# Patient Record
Sex: Female | Born: 1935 | Race: Black or African American | Hispanic: No | Smoking: Former smoker
Health system: Southern US, Community
[De-identification: ages and names within clinical notes are randomized; demographics above are authoritative.]

## PROBLEM LIST (undated history)

## (undated) DIAGNOSIS — K651 Peritoneal abscess: Secondary | ICD-10-CM

## (undated) DIAGNOSIS — N3289 Other specified disorders of bladder: Secondary | ICD-10-CM

## (undated) DIAGNOSIS — J9610 Chronic respiratory failure, unspecified whether with hypoxia or hypercapnia: Secondary | ICD-10-CM

## (undated) DIAGNOSIS — I480 Paroxysmal atrial fibrillation: Secondary | ICD-10-CM

## (undated) DIAGNOSIS — D649 Anemia, unspecified: Secondary | ICD-10-CM

## (undated) DIAGNOSIS — M1990 Unspecified osteoarthritis, unspecified site: Secondary | ICD-10-CM

## (undated) DIAGNOSIS — J449 Chronic obstructive pulmonary disease, unspecified: Secondary | ICD-10-CM

## (undated) DIAGNOSIS — J189 Pneumonia, unspecified organism: Secondary | ICD-10-CM

## (undated) DIAGNOSIS — IMO0002 Reserved for concepts with insufficient information to code with codable children: Secondary | ICD-10-CM

## (undated) DIAGNOSIS — N183 Chronic kidney disease, stage 3 unspecified: Secondary | ICD-10-CM

## (undated) DIAGNOSIS — IMO0001 Reserved for inherently not codable concepts without codable children: Secondary | ICD-10-CM

## (undated) DIAGNOSIS — I10 Essential (primary) hypertension: Secondary | ICD-10-CM

## (undated) DIAGNOSIS — I5042 Chronic combined systolic (congestive) and diastolic (congestive) heart failure: Secondary | ICD-10-CM

## (undated) DIAGNOSIS — I4729 Other ventricular tachycardia: Secondary | ICD-10-CM

## (undated) DIAGNOSIS — Z87442 Personal history of urinary calculi: Secondary | ICD-10-CM

## (undated) DIAGNOSIS — K6389 Other specified diseases of intestine: Secondary | ICD-10-CM

## (undated) DIAGNOSIS — E049 Nontoxic goiter, unspecified: Secondary | ICD-10-CM

## (undated) DIAGNOSIS — K219 Gastro-esophageal reflux disease without esophagitis: Secondary | ICD-10-CM

## (undated) DIAGNOSIS — I639 Cerebral infarction, unspecified: Secondary | ICD-10-CM

## (undated) DIAGNOSIS — K5720 Diverticulitis of large intestine with perforation and abscess without bleeding: Secondary | ICD-10-CM

## (undated) DIAGNOSIS — E039 Hypothyroidism, unspecified: Secondary | ICD-10-CM

## (undated) DIAGNOSIS — I472 Ventricular tachycardia: Secondary | ICD-10-CM

## (undated) DIAGNOSIS — I493 Ventricular premature depolarization: Secondary | ICD-10-CM

## (undated) HISTORY — PX: EYE SURGERY: SHX253

## (undated) HISTORY — DX: Ventricular tachycardia: I47.2

## (undated) HISTORY — DX: Cerebral infarction, unspecified: I63.9

## (undated) HISTORY — DX: Nontoxic goiter, unspecified: E04.9

## (undated) HISTORY — DX: Unspecified osteoarthritis, unspecified site: M19.90

## (undated) HISTORY — DX: Chronic respiratory failure, unspecified whether with hypoxia or hypercapnia: J96.10

## (undated) HISTORY — DX: Essential (primary) hypertension: I10

## (undated) HISTORY — PX: COLON SURGERY: SHX602

## (undated) HISTORY — PX: CATARACT EXTRACTION W/ INTRAOCULAR LENS  IMPLANT, BILATERAL: SHX1307

## (undated) HISTORY — DX: Reserved for inherently not codable concepts without codable children: IMO0001

## (undated) HISTORY — DX: Reserved for concepts with insufficient information to code with codable children: IMO0002

## (undated) HISTORY — DX: Chronic combined systolic (congestive) and diastolic (congestive) heart failure: I50.42

## (undated) HISTORY — DX: Other ventricular tachycardia: I47.29

## (undated) HISTORY — DX: Ventricular premature depolarization: I49.3

---

## 1978-09-02 HISTORY — PX: APPENDECTOMY: SHX54

## 1978-09-02 HISTORY — PX: ABDOMINAL HYSTERECTOMY: SHX81

## 2000-06-09 ENCOUNTER — Encounter: Payer: Self-pay | Admitting: Emergency Medicine

## 2000-06-09 ENCOUNTER — Emergency Department (HOSPITAL_COMMUNITY): Admission: AC | Admit: 2000-06-09 | Discharge: 2000-06-09 | Payer: Self-pay | Admitting: Emergency Medicine

## 2005-08-30 ENCOUNTER — Encounter: Payer: Self-pay | Admitting: Cardiovascular Disease

## 2005-10-08 ENCOUNTER — Ambulatory Visit (HOSPITAL_COMMUNITY): Admission: RE | Admit: 2005-10-08 | Discharge: 2005-10-08 | Payer: Self-pay | Admitting: Cardiovascular Disease

## 2005-10-10 ENCOUNTER — Ambulatory Visit: Payer: Self-pay | Admitting: Internal Medicine

## 2005-11-21 ENCOUNTER — Ambulatory Visit: Payer: Self-pay | Admitting: Internal Medicine

## 2006-01-04 ENCOUNTER — Ambulatory Visit: Payer: Self-pay | Admitting: Internal Medicine

## 2007-01-02 ENCOUNTER — Ambulatory Visit: Payer: Self-pay | Admitting: Internal Medicine

## 2007-01-02 ENCOUNTER — Inpatient Hospital Stay (HOSPITAL_COMMUNITY): Admission: EM | Admit: 2007-01-02 | Discharge: 2007-01-07 | Payer: Self-pay | Admitting: Emergency Medicine

## 2007-03-12 ENCOUNTER — Inpatient Hospital Stay (HOSPITAL_BASED_OUTPATIENT_CLINIC_OR_DEPARTMENT_OTHER): Admission: RE | Admit: 2007-03-12 | Discharge: 2007-03-12 | Payer: Self-pay | Admitting: Cardiovascular Disease

## 2009-02-25 ENCOUNTER — Encounter: Payer: Self-pay | Admitting: Cardiovascular Disease

## 2010-05-16 NOTE — H&P (Signed)
[name], [name] NO.:  [id number]   MEDICAL RECORD NO.:  [id number]          PATIENT TYPE:  INP   LOCATION:  2009                         FACILITY:  [facility]   PHYSICIAN:  Vesta Mixer, M.D. DATE OF BIRTH:  [birth year]   DATE OF ADMISSION:  01/02/2007  DATE OF DISCHARGE:  01/07/2007                              HISTORY & PHYSICAL   [name] is a elderly female with a history of COPD and asthma.  She  also has a history of nonsustained ventricular tachycardia,  hyperlipidemia, and some chest pain.  She was recently admitted to the  hospital with some chest pain.  She had some very minimal troponin  elevations.  It was thought that her symptoms were primarily due to COPD  exacerbation/asthma exacerbation.  She recently had a stress Cardiolite  study which did not reveal any evidence of ischemia but did reveal  moderately depressed left ventricular systolic function.  Because of  these symptoms, we have decided to go ahead and admit her for heart  catheterization.   The patient continues to have some shortness of breath.  She does not  have any real chest pains.  She has been slowly getting better and  thinks that she is feeling better now but she still has these  limitations.   CURRENT MEDICATIONS:  1. Furosemide 40 mg a day.  2. Combivent 2 puffs a day.  3. Aspirin 81 mg a day.   SHE IS ALLERGIC TO INDOCIN.   PAST MEDICAL HISTORY:  1. Asthma/COPD.  2. Hyperlipidemia.  3. Congestive heart failure.   SOCIAL HISTORY:  The patient used to smoke but quit 26 years ago.  She  does not drink alcohol.   FAMILY HISTORY:  Unremarkable.   REVIEW OF SYSTEMS:  Is reviewed and is essentially negative except for  as noted.   Elderly female in no acute distress.  She is alert and oriented x3 and  her mood and affect are normal.  Her weight is 177, blood pressure is  118/64 with a heart rate of 68.  HEENT EXAM:  Reveals 2+ carotids, she has no bruits, no JVD,  no  thyromegaly.  LUNGS:  Clear to auscultation.  HEART:  Regular rate, S1-S2.  ABDOMINAL EXAM:  Reveals good bowel sounds and is nontender.  EXTREMITIES:  She has no clubbing, cyanosis or edema.  NEURO EXAM:  Nonfocal.   [name] presents with moderately depressed left ventricular systolic  function.  She has had some intermittent episodes of chest pain.  I  would like to proceed with heart catheterization for further evaluation  of this.  We have discussed the risks, benefits and options of cardiac  catheterization.  She understands and agrees to proceed.           ______________________________  Vesta Mixer, M.D.     PJN/MEDQ  D:  03/05/2007  T:  03/06/2007  Job:  [id number]   cc:   Windle Guard, M.D.

## 2010-05-16 NOTE — Cardiovascular Report (Signed)
NAMESAMREEN, SELTZER                  ACCOUNT NO.:  [id number]   MEDICAL RECORD NO.:  [id number]          PATIENT TYPE:  OIB   LOCATION:  1963                         FACILITY:  [facility]   PHYSICIAN:  Vesta Mixer, M.D. DATE OF BIRTH:  [birth year]   DATE OF PROCEDURE:  03/12/2007  DATE OF DISCHARGE:  03/12/2007                            CARDIAC CATHETERIZATION   Ms. Racey is a 77 year old female with a history of asthma.  She also has  a history of congestive heart failure and hyperlipidemia.  She has had  some episodes of chest pain and was referred for heart catheterization  for further evaluation.   PROCEDURE:  Left heart catheterization with coronary angiography.  The  right femoral artery was easily cannulated using modified Seldinger  technique.   HEMODYNAMIC RESULTS:  LV pressure is 131/11, with an aortic pressure of  130/57.   ANGIOGRAPHY:  Left main:  The left main is fairly smooth and normal.   The left anterior descending artery is fairly large.  There are minor  luminal irregularities throughout the course of the LAD.  The first  diagonal artery is a very tiny vessel.  The second diagonal artery is a  large vessel with minor luminal irregularities.  The LAD reaches around  the apex and supplies a small amount of the apical inferior wall.   The left circumflex artery is a very large system.  It gives off a very  large and branching first obtuse marginal artery which is essentially  normal.  There are some minor luminal irregularities in the distal  circumflex artery.   The right coronary artery is large and dominant.  There are minor  luminal irregularities.  The posterior descending artery and the  posterolateral segment artery have minor luminal irregularities.   The left ventriculogram was performed in the 30 RAO position.  There was  some catheter induced ectopy.  The overall ejection fraction appears to  be mildly reduced with an ejection fraction of 40-45%.   The patient was also found to have calcified nodules or mass in what  appears to be her thyroid.  She may need further study (CT scan) for  this.   COMPLICATIONS:  None.   CONCLUSIONS:  1. Minor coronary artery irregularities.  2. Mildly depressed left ventricular systolic function.   We will continue with medical therapy.           ______________________________  Vesta Mixer, M.D.     PJN/MEDQ  D:  03/12/2007  T:  03/13/2007  Job:  [id number]   cc:   Windle Guard, M.D.

## 2010-05-16 NOTE — Consult Note (Signed)
NAMECRISTIE, [name]                  ACCOUNT NO.:  [id number]   MEDICAL RECORD NO.:  [id number]          PATIENT TYPE:  INP   LOCATION:  2018                         FACILITY:  [facility]   PHYSICIAN:  Georga Hacking, M.D.DATE OF BIRTH:  06-25-1925   DATE OF CONSULTATION:  DATE OF DISCHARGE:                                 CONSULTATION   REASON FOR CONSULTATION:  Abnormal cardiac enzymes, worsening dyspnea.   HISTORY:  The patient is a 77 year old black female who has prior  history of smoking, obesity, as well as long-standing hypertension.  She  developed fairly marked dyspnea on exertion and was felt to have some  lung disease and was seen by Dr. Elease Hashimoto a little over a year ago.  She  does not remember much in the way of details of the evaluation, but  states she was told that she had predominant lung disease and was seen  by Dr. Sandrea Hughs in October 2007.  At that time, Dr. [name] saw her and  treated her for reflux, stopped the benazepril and placed her on  Benicar.  She evidently had pulmonary function tests that showed some  reduction in diffusion capacity.  He treated her with p.r.n. albuterol.  He thought she had moderate chronic obstructive lung disease, but felt  it was related majorly to obesity.  She has continued to have fairly  marked dyspnea.  She developed worsening dyspnea over the past 2 weeks  and was seen by Dr. Jeannetta Nap and was treated with prednisone.  She  evidently at some point had been placed back on benazepril.  Her dyspnea  got much worse to the point where she was barely able to get out of the  bed or walk across the room, was fatigued.  She had occasional blood-  tinged, green sputum and chills for the past 2 days, but had some mild  intermittent chest tightness the 2 days prior to admission.  She was  seen in the emergency room and was admitted.  A chest x-ray showed  cardiomegaly with some interstitial lung changes.   LABORATORY DATA:  Showed an abnormal  glucose of 177 as well as a  troponin of 0.36, a BNP level of 600, and a CPK-MB of 7.1.  I was asked  to consult for evaluation of the abnormal cardiac enzymes.  In addition,  she had a few runs of nonsustained ventricular tachycardia.  She has  been given a single dose of Lasix intravenously, but continues to be  moderately short of breath.   PAST MEDICAL HISTORY:  Remarkable for hypertension, a remote history of  smoking, and a questionable history of mitral valve prolapse previously.  She has a history of hysterectomy and appendectomy.   ALLERGIES:  SHE IS INTOLERANT TO INDOMETHACIN AND ALSO HAS COUGH AND  UPPER AIRWAY SYMPTOMS ON ACE INHIBITORS.   MEDICATIONS:  She is currently treated with prednisone, benazepril, and  famotidine.   SOCIAL HISTORY:  She is currently a widow who is retired from [employer].  She quit smoking 25 to 30 years ago.  Does  not use alcohol to  access.  She has 6 children.   FAMILY HISTORY:  Mother died of heart disease and heart failure.  She  has several siblings who have had heart failure as well as breast  cancer.   REVIEW OF SYSTEMS:  She has been obese and has had a moderate amount of  fatigue.  She has no difficulty with earache, nosebleed, sore throat, or  other problems recently.  She has significant dyspnea, sputum  production, and a history of wheezing that she has rarely uses albuterol  for.  There is no history of nausea, vomiting, or diarrhea.  She has not  had any urinary frequency or dysuria.  She had some arthritis involving  her knee.  She has had no peripheral edema, but had some moderate low  back pain.   PHYSICAL EXAMINATION:  GENERAL:  She is an elderly, obese female in no  acute distress.  She was sleepy initially.  VITAL SIGNS:  Blood pressure was 136/76, pulse was currently 80 and  regular.  SKIN:  Warm and dry.  HEENT:  EOMI.  PERRLA.  C and S clear.  Fundi not examined.  Throat is  negative.  NECK:  Without masses.   There is no thyromegaly or bruits.  LUNGS:  Did not hear any wheezing.  There was decreased breath sounds.  CARDIOVASCULAR:  Showed normal S1, S2 without murmur.  ABDOMEN:  Obese and soft.  EXTREMITIES:  Her peripheral pulses were diminished.  Femoral pulses are  1 to 2+.   LABORATORY DATA:  Showed a potassium of 3.4, sodium of 139, chloride  108, CO2 22, BUN 12, creatinine 0.69, glucose 177.  White count of  16,700.  Normal hemoglobin and hematocrit.  B-natriuretic peptide was  600.  Troponin is 0.36.  CK-MB is 7.1.  Chest x-ray shows interstitial  prominence and questionable hilar infiltrate.  EKG is done at twice  standard, but shows LVH with lateral T wave inversion.   IMPRESSION:  1. Acute on chronic significant dyspnea.  Her B-natriuretic peptide      level is elevated, consistent with some degree of volume overload      or possible diastolic dysfunction.  2. Vague chest discomfort with abnormal troponin and myocardial      bridging that could be compatible with ischemia or congestive heart      failure.  3. History of chronic cough and currently on angiotensin-converting      enzyme inhibitors.  4. Obesity.  5. History of hypertension.  6. Reduced peripheral pulses compatible with peripheral vascular      disease.   RECOMMENDATIONS:  The patient has atypical symptoms with significant  dyspnea suggesting some degree of lung disease, but also has abnormal  cardiac enzymes and some mild runs of nonsustained ventricular  tachycardia.  At this point, I think she needs to have a more aggressive  cardiovascular workup, particularly in light of the abnormal enzymes.  I  would stop her ACE inhibitor and if needed change her to an ARB.  I  would treat her with Lasix and follow serial BNPs.  I would check an  echocardiogram.  I would fully anticoagulate her with Lovenox in view of  the abnormal MB and troponin and may need to have a catheterization to  further evaluate things  depending on the clinical course.   Thank you for asking me to see her with you.  Dr. Elease Hashimoto will see her in  the morning.  Georga Hacking, M.D.  Electronically Signed     WST/MEDQ  D:  01/02/2007  T:  01/02/2007  Job:  [id number]   cc:   Vesta Mixer, M.D.  Windle Guard, M.D.

## 2010-05-19 NOTE — Assessment & Plan Note (Signed)
[name]                               PULMONARY OFFICE NOTE   NAME:[name], Jacqueline REUTER                         MRN:          [id number]  DATE:11/21/2005                            DOB:          10/10/1925    This is a 77 year old black female reporting that she is no better since I  saw her on October 10, 2005, concerned about a diagnosis of pseudo-asthma  related to either ACE inhibitors or reflux.  It turns out that I had seen  her for severe cough associated with dyspnea.  The cough resolved off of ACE  inhibitors but the breathing is only  a little bit better.  At the same  time, note that I had stopped her Symbicort and in the meantime she has  found no increased need for albuterol on nocturnal awakening.  Her problem  is she says she cannot get across the room without giving out due to  dyspnea.  When I asked her more specifically, it turns out that other people  say she is short of breath.  In other words they tend to notice she is short  of breath before she does.  In other words when she goes across the room  to pick up the phone, then people say, Why are you so short of breath?  and Why are you wheezing?   ON PHYSICAL EXAMINATION:  GENERAL:  She is an ambulatory, obese, black  female in no acute distress.  VITAL SIGNS:  She has normal vital signs.  HEENT:  Unremarkable.  Oropharynx is clear.  LUNG FIELDS:  Perfectly clear bilaterally to auscultation and percussion.  There is excellent air movement.  HEART:  Regular rate and rhythm without murmur, gallop, or rub.  ABDOMEN:  Soft, benign.  EXTREMITIES:  Warm without calf tenderness, cyanosis, or clubbing.   LAB STUDIES:  From October 11, 2005, indicate a bicarb level of 28.  No  evidence of eosinophilia.  BNP 102.   IMPRESSION:  This patient clearly does not have typical asthma despite  evidence of air flow obstruction documented on previous PFTs.  I continue to  feel vocal cord  dysfunction is more likely, especially since other people  notice the patient is having more trouble breathing than she does because of  all the upper airway noises she makes.   I, therefore, walked the patient around. Before she left, we walked her  around the office 3 laps, which is in excess of 500 feet, and she notes she  had no desaturation or significant tachycardia. This is not consistent with  her complaints and her exercise tolerance was markedly disproportionate to  her complaint of being short of breath walking across the room.   RECOMMENDATIONS:  1. Continue off of ACE inhibitors and on Micardis 4 mg 1 daily.  2. Treat emperic reflux with Protonix 40 mg one every day 30 minutes      before meals for the next 4 weeks.  3. Reviewed with her that if she does begin having trouble when she  notices symptoms of      dyspnea and wheeze (that is not when other people notice), she should      self-medicate with      albuterol to 2 puffs every 4 hours, but the hope is that this will not      be necessary once reflux and ACE inhibitor irritability to upper airway      have been eliminated as suspects.     Charlaine Dalton. [name], MD, [facility]  Electronically Signed    MBW/MedQ  DD: 11/21/2005  DT: 11/21/2005  Job #: [id number]

## 2010-05-19 NOTE — Assessment & Plan Note (Signed)
[facility]                             PULMONARY OFFICE NOTE   NAME:[name], [name]                         MRN:          [id number]  DATE:01/04/2006                            DOB:          1925/02/13    HISTORY:  A 77 year old black female seen at Dr. Fabio Bering request for  evaluation of dyspnea with prominant upper airway features when I first  met her.  She states she could not make from one room to the next, but  actually we documented that she was able to ambulate 500 feet here on  her last visit with no desaturation.  She returns as requested for PFTs  for followup of remote smoking history.   PHYSICAL EXAMINATION:  She is an obese, ambulatory, black female, in no  acute distress.  She had stable vital signs.  Her blood pressure is 136/80 on Micardis 40  one daily.  HEENT:  Unremarkable.  Pharynx clear.  LUNG FIELDS:  Reveal diminished breath sounds without wheezing.  HEART:  Regular rhythm murmur, gallop or rub.  ABDOMEN:  Soft and benign.  EXTREMITIES:  Without calf tenderness, cyanosis, clubbing or edema.   PFTs were reviewed with the patient, and do indeed show moderate COPD  with a decreased diffusion capacity, but her baseline FEV1 is still 81%  of predicted with no improvement after bronchodilators.   IMPRESSION:  I believe she has moderate chronic obstructive pulmonary  disease.  I do not believe that is main mechanism of her dyspnea, which  is rather related to obesity.  The upper airway features of her symptoms  that previously resulted in her not being able to get across the room  have completely resolved with switching off ACE inhibitors, and I  believe represented upper airways dysfunction.   I reassured her, therefore, that her prognosis was good, and I did not  recommend anything other than attempts at weight loss and reconditioning  at this point.   Should she lose ground, however, at this point in terms of activity  tolerance or have increased any variability of her symptoms to suggest  an asthmatic component, I certainly would like to see her back here  promptly.  Otherwise, pulmonary followup can be p.r.n.     Charlaine Dalton. [name], MD, [facility]  Electronically Signed    MBW/MedQ  DD: 01/04/2006  DT: 01/04/2006  Job #: [id number]   cc:   Noralyn Pick. Eden Emms, MD, [facility]

## 2010-05-19 NOTE — Discharge Summary (Signed)
[name], [name]                  ACCOUNT NO.:  [id number]   MEDICAL RECORD NO.:  [id number]          PATIENT TYPE:  INP   LOCATION:  2009                         FACILITY:  [facility]   PHYSICIAN:  Madaline Guthrie, M.D.    DATE OF BIRTH:  08-Aug-1925   DATE OF ADMISSION:  01/02/2007  DATE OF DISCHARGE:  01/07/2007                               DISCHARGE SUMMARY   DISCHARGE DIAGNOSES:  1. Exacerbation of chronic obstructive pulmonary disease.  2. Elevated troponin possibly secondary to right heart failure.  3. Ventricular tachycardia.  4. Hyperkalemia.  5. Hypertension.  6. Hypoglycemia.  7. Kyphosis.  8. Status post hysterectomy.  9. Status post appendectomy.  10.Mitral valve prolapse.   DISCHARGE MEDICATIONS:  1. Aspirin 325 mg daily.  2. Lasix 40 mg 1 tablet daily.  3. Combivent 2 puffs as required for breathing difficulty up to 4      times a day.  4. Avelox 400 mg 1 tablet daily for 3 days.  5. Prednisone 50 mg for 1 day, 40 mg for 1 day, 30 mg for 1 day, 20 mg      for 1 day, and then 10 mg for 1 day and stop.  6. Zocor 40 mg once a day in the evening.  7. Mucinex 600 mg twice a day for cough for 3 days.  8. K-Dur 20 mEq once a day.   DISPOSITION AND FOLLOWUP:  The patient is to follow with Dr. Elease Hashimoto at  [facility], phone number [phone].  An appointment has been  setup for Tuesday, January 14, 2007, at 10:45 in the morning.  The  patient is to be reviewed for shortness of breath due to possible  cardiac cause.   The patient also has a followup appointment with her primary care  physician, Dr. Jeannetta Nap, phone number [phone] on January 15, 2007, at 10  o'clock.  At the followup visit, the patient is to be reviewed for  resolution of her exacerbation of COPD.  Also at the followup visit, a  repeat CBC is advised to look into the resolution of her leukocytosis.  The patient was also noted to have high blood glucose values while in  the hospital.  This was most  likely secondary to steroids.  A followup  measurement is advised during the followup visit.   PROCEDURES:  1. Chest x-ray on January 02, 2007.  Findings, heart is borderline      enlarged.  There is mild peribronchial thickening and left      infrahilar density noted, concerning for pneumonia.  2. X-ray on January 06, 2007.  Findings, accentuated bronchial markings      and interstitial markings with slight improvement in the      interstitial accentuation when compared to the prior study and      improved bibasilar aeration.  3. A 2D echocardiogram on January 03, 2007.  In summary, overall left      ventricular systolic function was normal.  Left ventricular      ejection fraction was estimated to 55%.  There were no left  ventricular regional wall motion abnormalities.  Left ventricular      wall thickness was moderately increased.  There was an increased      relative contribution of atrial contraction to left ventricular      filling.   Aortic wall thickness was mildly increased.  There was mild to moderate  mitral annular calcification.  The left atrium was mild to moderately  dilated.  There was mild to moderate pulmonic regurgitation.  Estimated  peak pulmonary artery systolic pressure was 36 mmHg.  Right atrium was  mildly dilated.   CONSULTATIONS:  The patient was consulted with Cardiology from  [facility] regarding heart problem of raised  troponin.  The patient was seen by Dr. Elease Hashimoto, as well as Dr. Donnie Aho.  No acute intervention was advised during this setting.   ADMISSION HISTORY:  [name] is a 77 year old lady with chronic dyspnea  previously followed by Dr. Lindie Spruce.  Her dyspnea was believed to be  secondary to moderate COPD and restriction due to obesity and upper  airway disease due to ACE inhibitor.  Two days prior to admission, her  chronic shortness of breath got much worse to the point that she was  barely able to get out of bed.  She was  very fatigued.  She also  endorsed cough with greenish sputum, which was occasionally blood tinged  with chills.  However, she denied any fever, denied any pleuritic chest  pain, but endorsed chest tightness off and on for the past 2 days prior  to admission.  She denied any diaphoresis, palpitations, nausea, or  edema.  Of note, she was treated for COPD exacerbation with prednisone  by Dr. Jeannetta Nap up until the day of presentation.   ADMISSION PHYSICAL:  VITAL SIGNS:  Temperature 97.4, blood pressure  161/69, pulse 122, and respiratory rate 20.  O2 sat 92% on room air, 96%  on 4 L via nasal cannula.  GENERAL:  Dyspneic at rest, but able to lie on her back.  EYES:  PERRLA.  EOMI.  Anicteric.  NECK:  Supple.  Questionable JVD.  RESPIRATORY:  Wheezing in bilateral bases, poor air movement.  Crackles  up to mid lung.  CARDIOVASCULAR:  Regular rate and rhythm.  No murmur, rubs, or gallop.  GI:  Soft.  Bowel sounds present.  Nontender and nondistended.  EXTREMITIES:  No edema.  Pulses are 2+ throughout.  SKIN:  Warm.  No rashes.   ADMISSION LABORATORIES:  White cell 16.7, ANC 1.6, hemoglobin 13.4, MCV  90.7, and platelets 22,000.  BNP 600, calcium 8.5, sodium 139, potassium  3.4, chloride 108, bicarbonate 22, BUN 12, creatinine 0.69, and blood  glucose 170.   HOSPITAL COURSE BY PROBLEM:  1. Dyspnea:  This was likely multifactorial with COPD infective      exacerbation superimposed on CHF.  The patient was basically      admitted and kept under close observation.  She was started on      Avelox.  Her steroid was continued and she was given breathing      treatment with bronchodilators.  She responded well to these      measures.  2. Elevated troponin:  The patient was not complaining of any chest      pain, but her troponins were elevated; the highest being 0.36.      Cardiovascular consultation was made.  No acute intervention was      advised.  During the setting, a 2D echo was  performed which showed      the ejection fraction of 55% with no wall motion abnormalities.      The pulmonary artery systolic pressure was 36 mmHg.  She is to be      worked up from cardiological prospective on an outpatient basis.  3. Hypertension:  The patient's hypertension was poorly controlled on      Lasix.  In fact, the patient was initially on benazepril at      presentation; this was stopped on admission.  Lasix as well as beta-      blocker was started in view of the fact that the patient could be      having some degree of heart failure.  Her beta-blocker was later      stopped because of the fact that she was having COPD exacerbation      upon cardiologic recommendation.  The patient's blood pressure is      to be reviewed on an outpatient basis by the primary care physician      and medications adjusted accordingly.  4. Hyperglycemia:  Holds most likely secondary to steroids.  This will      have to be monitored on an outpatient basis.  While in the      hospital, she was placed on sliding scale insulin.  5. Hyperkalemia:  It was most likely secondary to Lasix, as well as      her breathing treatment.  Hyperkalemia resolved with repletion.  6. Ventricular tachycardia:  Nonsustained ventricular tachycardia was      noted while in the hospital.  No intervention was advised from a      cardiologist's prospective during this admission.  Following this,      her albuterol was changed to Xopenex.  She did not have any further      episodes afterwards.   DISCHARGE DAY VITALS:  Temperature 97.6, blood pressure 117/71, pulse  62, and respiratory rate 18.  Oxygen saturation 98% on room air.   DISCHARGE DAY LABS:  White cells 2.5, hemoglobin 13.6, and platelets  366,000.  Sodium 141, potassium 3.8, chloride 102, bicarbonate 30,  glucose 105, BUN 27, creatinine 1.06, and calcium 8.7.      Zara Council, MD  Electronically Signed      Madaline Guthrie, M.D.  Electronically  Signed    AS/MEDQ  D:  01/08/2007  T:  01/09/2007  Job:  [id number]   cc:   Windle Guard, M.D.  Vesta Mixer, M.D.

## 2010-05-19 NOTE — Assessment & Plan Note (Signed)
[facility]                               PULMONARY OFFICE NOTE   NAME:[name], Jacqueline BORLAND                         MRN:          [id number]  DATE:11/21/2005                            DOB:          01-Aug-1925    IMPRESSION CONTINUED:  Before she left, we walked her around the office 3 laps, which is in excess  of 500 feet, and she notes she had no desaturation or significant  tachycardia. This is not consistent with her complaints and her exercise  tolerance was markedly disproportionate to her complaint of being short of  breath walking across the room.   RECOMMENDATIONS:  1. Continue off of ACE inhibitors and on Micardis 4 mg 1 daily.  2. Treat emperic reflux with Protonix 40 mg one every day 30 minutes      before meals for the next 4 weeks.  3. Reviewed with her that if she does begin having trouble when she      notices symptoms of dyspnea and wheeze (that is not when other people      notice), she should self-medicate with albuterol to 2 puffs every 4      hours, but the hope is that this will not be necessary once reflux and      ACE inhibitor irritability to upper airway have been eliminated as      suspects.     Charlaine Dalton. [name], MD, [facility]  Electronically Signed    MBW/MedQ  DD: 11/21/2005  DT: 11/21/2005  Job #: [id number]

## 2010-05-19 NOTE — Assessment & Plan Note (Signed)
[facility]                               PULMONARY OFFICE NOTE   NAME:[name], [name]                         MRN:          [id number]  DATE:10/10/2005                            DOB:          08-12-1925    CHIEF COMPLAINT:  Dyspnea.   HISTORY OF PRESENT ILLNESS:  A 77 year old black female remote smoker with a  two year history of dyspnea that has progressed to the point where she can  barely get from room to room associated with a dry hacking cough that is  present in the day and greater than at night and not associated with any  exertional chest pain, orthopnea, PND or leg swelling. She tells me she has  had no cardiac problems. She got a clean bill of health from Dr. Eden Emms and  underwent a set of PFTs (see comments below), and therefore seen at Dr.  Fabio Bering request for further evaluation of dyspnea. She does feel better  with albuterol but not with Symbicort.   PAST MEDICAL HISTORY:  Significant for hypertension. She is status post  hysterectomy and appendectomy.   ALLERGIES:  INDOCIN, INDOMETHACIN INTOLERANCE.   MEDICATIONS:  1. Benazepril.  2. Vitamin D.  3. Symbicort.   SOCIAL HISTORY:  She quit smoking 25 years ago. She is retired from  [employer]. She had no respiratory problems while working.   FAMILY HISTORY:  Positive for heart disease in her mother and cancer in her  sister of the breast.   REVIEW OF SYSTEMS:  Taken in detail on the worksheet. Significant for the  problems outlined above.   PHYSICAL EXAMINATION:  GENERAL:  This is a pleasant ambulatory white female  in no acute distress.  VITAL SIGNS:  Stable.  HEENT:  Unremarkable, oropharynx clear.  LUNGS:  Lung fields are perfectly clear bilaterally to auscultation except  for minimal pseudowheeze.  HEART:  There is regular rhythm without murmur, gallop or rub.  ABDOMEN:  Soft, benign.  EXTREMITIES:  Warm without calf tenderness, cyanosis, clubbing or edema.   PFTs were reviewed from October 08, 2005 and show an FEV1/FVC ratio of 58%,  but her actual FEV1 is 79% and there is no significant improvement after  bronchodilators. Diffusing capacity uncorrected for hemoglobin is 40. CBC  was obtained to see if she is anemic (to shed further light on the reduction  in diffuse capacity). A BMET was checked to see if her bicarb is abnormal,  and sed rate ratio was obtained as well looking for any evidence for occult,  diastolic dysfunction or inflammatory lung disease.   IMPRESSION:  Unexplained dyspnea with pseudowheeze and dry cough in a  patient who is on ACE inhibitors. The PFTs certainly do not explain why she  would be short of breath walking across the room, and the marked upper  airway instability she demonstrated here suggest either reflux or ACE  inhibitor induced upper airway dysfunction.   RECOMMENDATIONS:  Would therefore recommend the following:   1. First I reviewed with her an appropriate diet to reduce the risk of  GERD.  2. Stop benazepril and replace it with Benicar 20 mg one daily ( I warned      her that she might be lightheaded on the Benicar and if so she could      take a half a pill instead of a whole).  3. Continue to use albuterol p.r.n., but since she does not think      Symbicort is working she most likely does not have asthma. I have asked      her to stop it.  4. Follow up here in four weeks or sooner if her symptoms worsen.            ______________________________  Charlaine Dalton [name], MD, [facility]      MBW/MedQ  DD:  10/10/2005  DT:  10/12/2005  Job #:  [id number]   cc:   Noralyn Pick. Eden Emms, MD, [facility]

## 2010-09-21 ENCOUNTER — Other Ambulatory Visit: Payer: Self-pay | Admitting: Internal Medicine

## 2010-09-21 DIAGNOSIS — E049 Nontoxic goiter, unspecified: Secondary | ICD-10-CM

## 2010-09-21 LAB — I-STAT 8, (EC8 V) (CONVERTED LAB)
Bicarbonate: 21.4
Chloride: 109
HCT: 45
Hemoglobin: 15.3 — ABNORMAL HIGH
Operator id: 294341
TCO2: 22
pCO2, Ven: 25.5 — ABNORMAL LOW
pH, Ven: 7.531 — ABNORMAL HIGH

## 2010-09-21 LAB — BASIC METABOLIC PANEL
BUN: 12
BUN: 24 — ABNORMAL HIGH
BUN: 26 — ABNORMAL HIGH
BUN: 27 — ABNORMAL HIGH
CO2: 22
CO2: 27
CO2: 29
Calcium: 8.2 — ABNORMAL LOW
Calcium: 8.5
Calcium: 8.8
Calcium: 8.8
Chloride: 102
Creatinine, Ser: 0.88
Creatinine, Ser: 1.01
GFR calc Af Amer: >60
GFR calc Af Amer: >60
GFR calc non Af Amer: 50 — ABNORMAL LOW
GFR calc non Af Amer: 53 — ABNORMAL LOW
GFR calc non Af Amer: >60
GFR calc non Af Amer: >60
GFR calc non Af Amer: >60
Glucose, Bld: 102 — ABNORMAL HIGH
Glucose, Bld: 170 — ABNORMAL HIGH
Potassium: 3.4 — ABNORMAL LOW
Potassium: 3.8
Potassium: 3.8
Sodium: 133 — ABNORMAL LOW
Sodium: 141

## 2010-09-21 LAB — DIFFERENTIAL
Basophils Absolute: 0
Basophils Relative: 0
Eosinophils Absolute: 0
Eosinophils Relative: 0
Lymphocytes Relative: 6 — ABNORMAL LOW

## 2010-09-21 LAB — LIPID PANEL
HDL: 20 — ABNORMAL LOW
Total CHOL/HDL Ratio: 6.4
Triglycerides: 52
VLDL: 10

## 2010-09-21 LAB — CBC
HCT: 37.6
HCT: 39.3
HCT: 40
HCT: 40.5
Hemoglobin: 12.3
Hemoglobin: 13.6
MCHC: 33
MCHC: 33.4
MCHC: 33.5
MCHC: 34
MCV: 89.3
MCV: 89.3
MCV: 91.4
Platelets: 322
Platelets: 344
Platelets: 351
Platelets: 378
RBC: 4.05
RBC: 4.54
RDW: 11.9
RDW: 12.1
RDW: 12.1
WBC: 15.8 — ABNORMAL HIGH
WBC: 16.5 — ABNORMAL HIGH

## 2010-09-21 LAB — CARDIAC PANEL(CRET KIN+CKTOT+MB+TROPI)
CK, MB: 3.6
Relative Index: INVALID
Relative Index: INVALID
Troponin I: 0.12 — ABNORMAL HIGH

## 2010-09-21 LAB — CULTURE, BLOOD (ROUTINE X 2): Culture: NO GROWTH

## 2010-09-21 LAB — HEMOGLOBIN A1C: Hgb A1c MFr Bld: 5.3

## 2010-09-21 LAB — MAGNESIUM
Magnesium: 2.1
Magnesium: 2.2

## 2010-09-21 LAB — RAPID URINE DRUG SCREEN, HOSP PERFORMED
Benzodiazepines: NOT DETECTED
Cocaine: NOT DETECTED
Tetrahydrocannabinol: NOT DETECTED

## 2010-10-03 ENCOUNTER — Encounter (HOSPITAL_COMMUNITY)
Admission: RE | Admit: 2010-10-03 | Discharge: 2010-10-03 | Disposition: A | Discharge status: Home / Self Care | Payer: Medicare Other | Source: Ambulatory Visit | Attending: Internal Medicine | Admitting: Internal Medicine

## 2010-10-03 DIAGNOSIS — E049 Nontoxic goiter, unspecified: Secondary | ICD-10-CM

## 2010-10-04 ENCOUNTER — Encounter (HOSPITAL_COMMUNITY)
Admission: RE | Admit: 2010-10-04 | Discharge: 2010-10-04 | Disposition: A | Discharge status: Home / Self Care | Payer: Medicare Other | Source: Ambulatory Visit | Attending: Internal Medicine | Admitting: Internal Medicine

## 2010-10-04 DIAGNOSIS — E0520 Thyrotoxicosis with toxic multinodular goiter without thyrotoxic crisis or storm: Secondary | ICD-10-CM | POA: Insufficient documentation

## 2010-10-04 DIAGNOSIS — R490 Dysphonia: Secondary | ICD-10-CM | POA: Insufficient documentation

## 2010-10-04 DIAGNOSIS — E0590 Thyrotoxicosis, unspecified without thyrotoxic crisis or storm: Secondary | ICD-10-CM | POA: Insufficient documentation

## 2010-10-04 DIAGNOSIS — R221 Localized swelling, mass and lump, neck: Secondary | ICD-10-CM | POA: Insufficient documentation

## 2010-10-04 DIAGNOSIS — R220 Localized swelling, mass and lump, head: Secondary | ICD-10-CM | POA: Insufficient documentation

## 2010-10-04 MED ORDER — SODIUM IODIDE I 131 CAPSULE
17.0000 | Freq: Once | INTRAVENOUS | Status: AC | PRN
  Administered 2010-10-04: 17 via ORAL

## 2010-10-04 MED ORDER — SODIUM PERTECHNETATE TC 99M INJECTION
10.0000 | Freq: Once | INTRAVENOUS | Status: AC | PRN
  Administered 2010-10-04: 10 via INTRAVENOUS

## 2010-10-18 ENCOUNTER — Other Ambulatory Visit: Payer: Self-pay | Admitting: Internal Medicine

## 2010-10-18 DIAGNOSIS — E0590 Thyrotoxicosis, unspecified without thyrotoxic crisis or storm: Secondary | ICD-10-CM

## 2010-10-20 ENCOUNTER — Encounter (HOSPITAL_COMMUNITY)
Admission: RE | Admit: 2010-10-20 | Discharge: 2010-10-20 | Disposition: A | Discharge status: Home / Self Care | Payer: Medicare Other | Source: Ambulatory Visit | Attending: Internal Medicine | Admitting: Internal Medicine

## 2010-10-20 DIAGNOSIS — E0590 Thyrotoxicosis, unspecified without thyrotoxic crisis or storm: Secondary | ICD-10-CM

## 2010-10-20 MED ORDER — SODIUM IODIDE I 131 CAPSULE
36.3000 | Freq: Once | INTRAVENOUS | Status: AC | PRN
  Administered 2010-10-20: 36.3 via ORAL

## 2011-01-29 ENCOUNTER — Encounter: Payer: Self-pay | Admitting: *Deleted

## 2011-04-23 ENCOUNTER — Encounter: Payer: Self-pay | Admitting: *Deleted

## 2011-09-07 ENCOUNTER — Encounter: Payer: Self-pay | Admitting: Cardiovascular Disease

## 2012-01-02 ENCOUNTER — Encounter (HOSPITAL_COMMUNITY): Payer: Self-pay | Admitting: *Deleted

## 2012-01-02 ENCOUNTER — Ambulatory Visit: Payer: Medicare Other

## 2012-01-02 ENCOUNTER — Observation Stay (HOSPITAL_COMMUNITY)
Admission: EM | Admit: 2012-01-02 | Discharge: 2012-01-03 | Disposition: A | Discharge status: Home / Self Care | Payer: Medicare Other | Attending: Internal Medicine | Admitting: Internal Medicine

## 2012-01-02 ENCOUNTER — Ambulatory Visit (INDEPENDENT_AMBULATORY_CARE_PROVIDER_SITE_OTHER): Payer: Medicare Other | Admitting: Emergency Medicine

## 2012-01-02 VITALS — BP 133/74 | HR 84 | Temp 98.6°F | Resp 17 | Ht 61.0 in | Wt 155.0 lb

## 2012-01-02 DIAGNOSIS — J189 Pneumonia, unspecified organism: Secondary | ICD-10-CM

## 2012-01-02 DIAGNOSIS — Z8639 Personal history of other endocrine, nutritional and metabolic disease: Secondary | ICD-10-CM | POA: Diagnosis present

## 2012-01-02 DIAGNOSIS — J441 Chronic obstructive pulmonary disease with (acute) exacerbation: Secondary | ICD-10-CM | POA: Insufficient documentation

## 2012-01-02 DIAGNOSIS — I493 Ventricular premature depolarization: Secondary | ICD-10-CM

## 2012-01-02 DIAGNOSIS — I10 Essential (primary) hypertension: Secondary | ICD-10-CM | POA: Diagnosis present

## 2012-01-02 DIAGNOSIS — R110 Nausea: Secondary | ICD-10-CM | POA: Insufficient documentation

## 2012-01-02 DIAGNOSIS — R05 Cough: Secondary | ICD-10-CM

## 2012-01-02 DIAGNOSIS — R109 Unspecified abdominal pain: Secondary | ICD-10-CM | POA: Insufficient documentation

## 2012-01-02 DIAGNOSIS — R630 Anorexia: Secondary | ICD-10-CM | POA: Insufficient documentation

## 2012-01-02 DIAGNOSIS — D72829 Elevated white blood cell count, unspecified: Secondary | ICD-10-CM

## 2012-01-02 DIAGNOSIS — I5042 Chronic combined systolic (congestive) and diastolic (congestive) heart failure: Secondary | ICD-10-CM | POA: Diagnosis present

## 2012-01-02 DIAGNOSIS — N819 Female genital prolapse, unspecified: Secondary | ICD-10-CM | POA: Insufficient documentation

## 2012-01-02 DIAGNOSIS — J449 Chronic obstructive pulmonary disease, unspecified: Secondary | ICD-10-CM | POA: Diagnosis present

## 2012-01-02 DIAGNOSIS — N390 Urinary tract infection, site not specified: Secondary | ICD-10-CM | POA: Diagnosis present

## 2012-01-02 DIAGNOSIS — I4949 Other premature depolarization: Secondary | ICD-10-CM | POA: Insufficient documentation

## 2012-01-02 DIAGNOSIS — I509 Heart failure, unspecified: Secondary | ICD-10-CM | POA: Insufficient documentation

## 2012-01-02 DIAGNOSIS — E039 Hypothyroidism, unspecified: Secondary | ICD-10-CM | POA: Insufficient documentation

## 2012-01-02 HISTORY — DX: Pneumonia, unspecified organism: J18.9

## 2012-01-02 HISTORY — DX: Hypothyroidism, unspecified: E03.9

## 2012-01-02 HISTORY — DX: Chronic obstructive pulmonary disease, unspecified: J44.9

## 2012-01-02 LAB — CBC
HCT: 36.2 % (ref 36.0–46.0)
Hemoglobin: 12.3 g/dL (ref 12.0–15.0)
MCH: 30.8 pg (ref 26.0–34.0)
MCHC: 34 g/dL (ref 30.0–36.0)
MCV: 90.7 fL (ref 78.0–100.0)
RDW: 12 % (ref 11.5–15.5)

## 2012-01-02 LAB — URINALYSIS, ROUTINE W REFLEX MICROSCOPIC
Ketones, ur: 15 mg/dL — AB
Nitrite: POSITIVE — AB
Protein, ur: 100 mg/dL — AB
Urobilinogen, UA: 1 mg/dL (ref 0.0–1.0)
pH: 5 (ref 5.0–8.0)

## 2012-01-02 LAB — INFLUENZA PANEL BY PCR (TYPE A & B)
Influenza A By PCR: NEGATIVE
Influenza B By PCR: NEGATIVE

## 2012-01-02 LAB — COMPREHENSIVE METABOLIC PANEL
Albumin: 3.3 g/dL — ABNORMAL LOW (ref 3.5–5.2)
Alkaline Phosphatase: 67 U/L (ref 39–117)
BUN: 17 mg/dL (ref 6–23)
Creatinine, Ser: 0.91 mg/dL (ref 0.50–1.10)
GFR calc Af Amer: 65 mL/min — ABNORMAL LOW (ref >90)
Glucose, Bld: 118 mg/dL — ABNORMAL HIGH (ref 70–99)
Potassium: 3.9 mEq/L (ref 3.5–5.1)
Total Protein: 7.3 g/dL (ref 6.0–8.3)

## 2012-01-02 LAB — URINE MICROSCOPIC-ADD ON

## 2012-01-02 MED ORDER — DEXTROSE 5 % IV SOLN
500.0000 mg | INTRAVENOUS | Status: DC
  Administered 2012-01-02: 500 mg via INTRAVENOUS
  Filled 2012-01-02: qty 500

## 2012-01-02 MED ORDER — SODIUM CHLORIDE 0.9 % IV BOLUS (SEPSIS)
500.0000 mL | Freq: Once | INTRAVENOUS | Status: AC
  Administered 2012-01-02: 500 mL via INTRAVENOUS

## 2012-01-02 MED ORDER — CLARITHROMYCIN ER 500 MG PO TB24: 1000.0000 mg | ORAL_TABLET | Freq: Every day | ORAL | Status: DC

## 2012-01-02 MED ORDER — ALBUTEROL SULFATE (5 MG/ML) 0.5% IN NEBU
2.5000 mg | INHALATION_SOLUTION | RESPIRATORY_TRACT | Status: DC
  Administered 2012-01-02 – 2012-01-03 (×5): 2.5 mg via RESPIRATORY_TRACT
  Filled 2012-01-02 (×5): qty 0.5

## 2012-01-02 MED ORDER — LEVOFLOXACIN 750 MG PO TABS
750.0000 mg | ORAL_TABLET | ORAL | Status: DC
  Filled 2012-01-02: qty 1

## 2012-01-02 MED ORDER — IPRATROPIUM BROMIDE 0.02 % IN SOLN
0.5000 mg | RESPIRATORY_TRACT | Status: DC
  Administered 2012-01-02 – 2012-01-03 (×5): 0.5 mg via RESPIRATORY_TRACT
  Filled 2012-01-02 (×5): qty 2.5

## 2012-01-02 MED ORDER — ASPIRIN 81 MG PO TABS: 81.0000 mg | ORAL_TABLET | Freq: Every day | ORAL | Status: DC

## 2012-01-02 MED ORDER — LISINOPRIL 10 MG PO TABS
10.0000 mg | ORAL_TABLET | Freq: Every day | ORAL | Status: DC
  Administered 2012-01-02: 10 mg via ORAL
  Filled 2012-01-02 (×2): qty 1

## 2012-01-02 MED ORDER — SODIUM CHLORIDE 0.9 % IV SOLN
INTRAVENOUS | Status: AC
  Administered 2012-01-02: 19:00:00 via INTRAVENOUS

## 2012-01-02 MED ORDER — SODIUM CHLORIDE 0.9 % IV SOLN: INTRAVENOUS | Status: DC

## 2012-01-02 MED ORDER — PREDNISONE 20 MG PO TABS
40.0000 mg | ORAL_TABLET | Freq: Every day | ORAL | Status: DC
  Administered 2012-01-03: 40 mg via ORAL
  Filled 2012-01-02 (×2): qty 2

## 2012-01-02 MED ORDER — LEVOFLOXACIN 750 MG PO TABS
750.0000 mg | ORAL_TABLET | ORAL | Status: DC
  Administered 2012-01-02: 750 mg via ORAL
  Filled 2012-01-02: qty 1

## 2012-01-02 MED ORDER — ALBUTEROL SULFATE (5 MG/ML) 0.5% IN NEBU
5.0000 mg | INHALATION_SOLUTION | Freq: Once | RESPIRATORY_TRACT | Status: AC
  Administered 2012-01-02: 5 mg via RESPIRATORY_TRACT
  Filled 2012-01-02: qty 1

## 2012-01-02 MED ORDER — DEXTROSE 5 % IV SOLN
1.0000 g | INTRAVENOUS | Status: DC
  Administered 2012-01-02: 1 g via INTRAVENOUS
  Filled 2012-01-02: qty 10

## 2012-01-02 MED ORDER — ENOXAPARIN SODIUM 40 MG/0.4ML ~~LOC~~ SOLN
40.0000 mg | SUBCUTANEOUS | Status: DC
  Administered 2012-01-02: 40 mg via SUBCUTANEOUS
  Filled 2012-01-02 (×2): qty 0.4

## 2012-01-02 MED ORDER — ASPIRIN EC 81 MG PO TBEC
81.0000 mg | DELAYED_RELEASE_TABLET | Freq: Every day | ORAL | Status: DC
  Administered 2012-01-02 – 2012-01-03 (×2): 81 mg via ORAL
  Filled 2012-01-02 (×2): qty 1

## 2012-01-02 MED ORDER — METHYLPREDNISOLONE SODIUM SUCC 40 MG IJ SOLR
40.0000 mg | Freq: Once | INTRAMUSCULAR | Status: AC
  Administered 2012-01-02: 40 mg via INTRAVENOUS
  Filled 2012-01-02: qty 1

## 2012-01-02 MED ORDER — ENOXAPARIN SODIUM 40 MG/0.4ML ~~LOC~~ SOLN
40.0000 mg | SUBCUTANEOUS | Status: DC
  Filled 2012-01-02: qty 0.4

## 2012-01-02 NOTE — Progress Notes (Signed)
[facility] [address] [place] [address] [phone]  Date:  01/02/2012   Name:  [name]   DOB:  12-31-1925   MRN:  [id number]  PCP:  Kaleen Mask, MD    Chief Complaint: Chest Pain   History of Present Illness:  [name] is a 79 y.o. very pleasant female patient who presents with the following:  Ill 3 days with a persistent cough.  Coughing up purulent sputum.  Coughing to the point of chest pain in back and chest.  Has exertional shortness of breath normally and is more so now.  Some wheezing.  No rash or stool change, nausea or vomiting.  Poor appetite and po intake of liquids due anorexia.  No fever documented but having chills.  No improvement with OTC medication.  Had a flu shot.  There is no problem list on file for this patient.   Past Medical History  Diagnosis Date  . Hypertension   . Goiter   . Arthritis   . Chest pain   . SOB (shortness of breath)     Past Surgical History  Procedure Date  . Hysterectomy - unknown type   . Appendectomy     History  Substance Use Topics  . Smoking status: Former Smoker    Quit date: 01/02/1991  . Smokeless tobacco: Not on file  . Alcohol Use: No    Family History  Problem Relation Age of Onset  . Heart disease      Allergies  Allergen Reactions  . Indomethacin     Medication list has been reviewed and updated.  Current Outpatient Prescriptions on File Prior to Visit  Medication Sig Dispense Refill  . albuterol-ipratropium (COMBIVENT) 18-103 MCG/ACT inhaler Inhale 2 puffs into the lungs every 6 (six) hours as needed.      Marland Kitchen aspirin 81 MG tablet Take 81 mg by mouth daily.      Marland Kitchen ibuprofen (MOTRIN IB) 200 MG tablet Take 200 mg by mouth 4 days.      Marland Kitchen lisinopril (PRINIVIL,ZESTRIL) 10 MG tablet Take 10 mg by mouth.      . Vitamin D, Ergocalciferol, (DRISDOL) 50000 UNITS CAPS Take 50,000 Units by mouth every 14 (fourteen) days.      . furosemide (LASIX) 20 MG tablet Take  20 mg by mouth daily.        Review of Systems:  As per HPI, otherwise negative.    Physical Examination: Filed Vitals:   01/02/12 0938  BP: 133/74  Pulse: 84  Temp: 98.6 F (37 C)  Resp: 17   Filed Vitals:   01/02/12 0938  Height: 5\' 1"  (1.549 m)  Weight: 155 lb (70.308 kg)   Body mass index is 29.29 kg/(m^2). Ideal Body Weight: Weight in (lb) to have BMI = 25: 132   GEN: WDWN, NAD, Non-toxic, A & O x 3 HEENT: Atraumatic, Normocephalic. Neck supple. No masses, No LAD. Ears and Nose: No external deformity. CV: RRR, No M/G/R. No JVD. No thrill. No extra heart sounds. PULM: CTA B, no wheezes,  rhonchi. No retractions. No resp. distress. No accessory muscle use. Right basilar rales ABD: S, NT, ND, +BS. No rebound. No HSM. EXTR: No c/c/e NEURO Normal gait.  PSYCH: Normally interactive. Conversant. Not depressed or anxious appearing.  Calm demeanor.    Assessment and Plan: Pneumonia biaxin Discussed admission with patient but she says she is no more short of breath than usual.   Will arrange  to have family member stay with her for the next day or so and return if worse.  Carmelina Dane, MD  [facility] reading (PRIMARY) by  Dr. Dareen Piano.  Right basilar infiltrate with blunted CPA.

## 2012-01-02 NOTE — ED Provider Notes (Signed)
History     CSN: [id number]  Arrival date & time 01/02/12  1144   None     Chief Complaint  Patient presents with  . Shortness of Breath    (Consider location/radiation/quality/duration/timing/severity/associated sxs/prior treatment) The history is provided by the patient.  pt w productive cough for past few days. Feels sob, esp w coughing spell. Notes bil chest and upper abd soreness w coughing spells, otherwise denies cp or abd pain. No nv. Having normal stools. No sore throat, runny nose, headache, body aches or other uri c/o. Subjective fever. Went to urgent care and was referred here for possible admission. No known ill contacts. Hx copd. Former smoker.   No orthopnea or pnd.    Past Medical History  Diagnosis Date  . Hypertension   . Goiter   . Arthritis   . Chest pain   . SOB (shortness of breath)     Past Surgical History  Procedure Date  . Hysterectomy - unknown type   . Appendectomy     Family History  Problem Relation Age of Onset  . Heart disease      History  Substance Use Topics  . Smoking status: Former Smoker    Quit date: 01/02/1991  . Smokeless tobacco: Not on file  . Alcohol Use: No    OB History    Grav Para Term Preterm Abortions TAB SAB Ect Mult Living                  Review of Systems  Constitutional: Negative for fever and chills.  HENT: Negative for sore throat and neck pain.   Eyes: Negative for redness.  Respiratory: Positive for cough and shortness of breath.   Cardiovascular: Negative for chest pain and leg swelling.  Gastrointestinal: Negative for abdominal pain.  Genitourinary: Negative for flank pain.  Musculoskeletal: Negative for back pain.  Skin: Negative for rash.  Neurological: Negative for headaches.  Hematological: Does not bruise/bleed easily.  Psychiatric/Behavioral: Negative for confusion.    Allergies  Indomethacin  Home Medications   Current Outpatient Rx  Name  Route  Sig  Dispense  Refill  .  IPRATROPIUM-ALBUTEROL 18-103 MCG/ACT IN AERO   Inhalation   Inhale 2 puffs into the lungs every 6 (six) hours as needed.         . ASPIRIN 81 MG PO TABS   Oral   Take 81 mg by mouth daily.         Marland Kitchen CLARITHROMYCIN ER 500 MG PO TB24   Oral   Take 2 tablets (1,000 mg total) by mouth daily.   20 tablet   0   . FUROSEMIDE 20 MG PO TABS   Oral   Take 20 mg by mouth daily.         . IBUPROFEN 200 MG PO TABS   Oral   Take 200 mg by mouth 4 days.         Marland Kitchen LISINOPRIL 10 MG PO TABS   Oral   Take 10 mg by mouth.         Marland Kitchen VITAMIN D (ERGOCALCIFEROL) 50000 UNITS PO CAPS   Oral   Take 50,000 Units by mouth every 14 (fourteen) days.           BP 139/61  Pulse 85  Temp 98.3 F (36.8 C) (Oral)  Resp 18  SpO2 95%  Physical Exam  Nursing note and vitals reviewed. Constitutional: She is oriented to person, place, and time. She appears well-developed  and well-nourished. No distress.  HENT:  Nose: Nose normal.  Mouth/Throat: Oropharynx is clear and moist.  Eyes: Conjunctivae normal are normal. No scleral icterus.  Neck: Neck supple. No JVD present. No tracheal deviation present.  Cardiovascular: Normal rate, regular rhythm, normal heart sounds and intact distal pulses.   Pulmonary/Chest: Effort normal. No respiratory distress. She has rales. She exhibits tenderness.       Rales right base  Abdominal: Soft. Normal appearance and bowel sounds are normal. She exhibits no distension. There is no tenderness.  Genitourinary:       No cva tenderness  Musculoskeletal: She exhibits no edema and no tenderness.  Neurological: She is alert and oriented to person, place, and time.  Skin: Skin is warm and dry. No rash noted. She is not diaphoretic.  Psychiatric: She has a normal mood and affect.    ED Course  Procedures (including critical care time)  Labs Reviewed - No data to display Dg Chest 2 View  01/02/2012  *RADIOLOGY REPORT*  Clinical Data: Cough.  CHEST - 2 VIEW   Comparison: 01/06/2007  Findings: Lungs are adequately inflated with mild hazy density of the lateral right base which may be due to infection versus atelectasis.  There is no effusion.  The cardiomediastinal silhouette and remainder of the exam to include calcification projected over the midline neck base is unchanged.  IMPRESSION: Hazy airspace density over the lateral right base suggesting atelectasis versus early infection.   Original Report Authenticated By: Elberta Fortis, M.D.     Results for orders placed during the hospital encounter of 01/02/12  CBC      Component Value Range   WBC 15.8 (*) 4.0 - 10.5 K/uL   RBC 3.99  3.87 - 5.11 MIL/uL   Hemoglobin 12.3  12.0 - 15.0 g/dL   HCT 45.4  09.8 - 11.9 %   MCV 90.7  78.0 - 100.0 fL   MCH 30.8  26.0 - 34.0 pg   MCHC 34.0  30.0 - 36.0 g/dL   RDW 14.7  82.9 - 56.2 %   Platelets 265  150 - 400 K/uL  COMPREHENSIVE METABOLIC PANEL      Component Value Range   Sodium 134 (*) 135 - 145 mEq/L   Potassium 3.9  3.5 - 5.1 mEq/L   Chloride 97  96 - 112 mEq/L   CO2 24  19 - 32 mEq/L   Glucose, Bld 118 (*) 70 - 99 mg/dL   BUN 17  6 - 23 mg/dL   Creatinine, Ser 1.30  0.50 - 1.10 mg/dL   Calcium 9.0  8.4 - 86.5 mg/dL   Total Protein 7.3  6.0 - 8.3 g/dL   Albumin 3.3 (*) 3.5 - 5.2 g/dL   AST 18  0 - 37 U/L   ALT 13  0 - 35 U/L   Alkaline Phosphatase 67  39 - 117 U/L   Total Bilirubin 1.5 (*) 0.3 - 1.2 mg/dL   GFR calc non Af Amer 56 (*) >90 mL/min   GFR calc Af Amer 65 (*) >90 mL/min  INFLUENZA PANEL BY PCR      Component Value Range   Influenza A By PCR NEGATIVE  NEGATIVE   Influenza B By PCR NEGATIVE  NEGATIVE   H1N1 flu by pcr NOT DETECTED  NOT DETECTED  URINALYSIS, ROUTINE W REFLEX MICROSCOPIC      Component Value Range   Color, Urine ORANGE (*) YELLOW   APPearance CLOUDY (*) CLEAR   Specific  Gravity, Urine 1.027  1.005 - 1.030   pH 5.0  5.0 - 8.0   Glucose, UA NEGATIVE  NEGATIVE mg/dL   Hgb urine dipstick MODERATE (*) NEGATIVE    Bilirubin Urine SMALL (*) NEGATIVE   Ketones, ur 15 (*) NEGATIVE mg/dL   Protein, ur 782 (*) NEGATIVE mg/dL   Urobilinogen, UA 1.0  0.0 - 1.0 mg/dL   Nitrite POSITIVE (*) NEGATIVE   Leukocytes, UA LARGE (*) NEGATIVE  STREP PNEUMONIAE URINARY ANTIGEN      Component Value Range   Strep Pneumo Urinary Antigen NEGATIVE  NEGATIVE  URINE MICROSCOPIC-ADD ON      Component Value Range   Squamous Epithelial / LPF FEW (*) RARE   WBC, UA 7-10  <3 WBC/hpf   RBC / HPF 0-2  <3 RBC/hpf   Bacteria, UA FEW (*) RARE  CULTURE, EXPECTORATED SPUTUM-ASSESSMENT      Component Value Range   Specimen Description SPUTUM     Special Requests Normal     Sputum evaluation       Value: THIS SPECIMEN IS ACCEPTABLE. RESPIRATORY CULTURE REPORT TO FOLLOW.   Report Status 01/03/2012 FINAL    BASIC METABOLIC PANEL      Component Value Range   Sodium 136  135 - 145 mEq/L   Potassium 3.6  3.5 - 5.1 mEq/L   Chloride 100  96 - 112 mEq/L   CO2 23  19 - 32 mEq/L   Glucose, Bld 142 (*) 70 - 99 mg/dL   BUN 21  6 - 23 mg/dL   Creatinine, Ser 9.56  0.50 - 1.10 mg/dL   Calcium 8.7  8.4 - 21.3 mg/dL   GFR calc non Af Amer 65 (*) >90 mL/min   GFR calc Af Amer 75 (*) >90 mL/min  CBC      Component Value Range   WBC 16.5 (*) 4.0 - 10.5 K/uL   RBC 3.54 (*) 3.87 - 5.11 MIL/uL   Hemoglobin 11.4 (*) 12.0 - 15.0 g/dL   HCT 08.6 (*) 57.8 - 46.9 %   MCV 90.4  78.0 - 100.0 fL   MCH 32.2  26.0 - 34.0 pg   MCHC 35.6  30.0 - 36.0 g/dL   RDW 62.9  52.8 - 41.3 %   Platelets 250  150 - 400 K/uL   Dg Chest 2 View  01/02/2012  *RADIOLOGY REPORT*  Clinical Data: Cough.  CHEST - 2 VIEW  Comparison: 01/06/2007  Findings: Lungs are adequately inflated with mild hazy density of the lateral right base which may be due to infection versus atelectasis.  There is no effusion.  The cardiomediastinal silhouette and remainder of the exam to include calcification projected over the midline neck base is unchanged.  IMPRESSION: Hazy airspace density  over the lateral right base suggesting atelectasis versus early infection.   Original Report Authenticated By: Elberta Fortis, M.D.        MDM  Iv ns. Labs.    Date: 01/02/2012  Rate: 86  Rhythm: normal sinus rhythm  QRS Axis: normal  Intervals: normal  ST/T Wave abnormalities: nonspecific ST/T changes  Conduction Disutrbances:none  Narrative Interpretation:   Old EKG Reviewed: none available   500 cc ns bolus. Rocephin iv. zithromax iv.   Given probable pna, copd,dyspnea, will admit.   Med service called for admission.       Suzi Roots, MD 01/03/12 1045

## 2012-01-02 NOTE — ED Notes (Signed)
PT was told to bring patient to the emergency room from the urgent care doctor for admission after being diagnosed with pneumonia today.  Pt reports sob and complains of stomach and chest pain from coughing.  Pt has productive cough with green and yellow sputum.  Pt thinks may have had a fever, chills, and decreased appetite

## 2012-01-02 NOTE — ED Notes (Signed)
EKG signed by Dr. Denton Lank.  Extra copy placed in pt chart

## 2012-01-02 NOTE — Patient Instructions (Signed)

## 2012-01-02 NOTE — H&P (Signed)
Hospital Admission Note Date: 01/02/2012  Patient name: [name] Medical record number: [id number] Date of birth: 1925-03-29 Age: 79 y.o. Gender: female PCP: Kaleen Mask, MD   Service:  Internal Medicine Teaching Service  Attending Physician:  Dr. Debe Coder   First Contact:  Dr. Earlene Plater   Pager:  [phone] Second Contact:  Dr. Bosie Clos Pager: [phone]     After 5PM, weekends, and holidays: First Contact:              Pager: [phone] Second Contact:         Pager: [phone]    Chief Complaint:  pneumonia    History of Present Illness:  This is an 79 year old female with COPD and hypertension, presenting to the emergency department with 4 days of cough productive of purulent sputum and dyspnea. Onset was 3 days prior to presentation. Presenting symptoms were fatigue, malaise, and cough producing greenish sputum. She has dyspnea at baseline, which she says may be a little bit worse now. There have been sick contacts in the family. Associated symptoms include anorexia and chills. No objective fevers measured at home. Over the last day or 2 she's had some abdominal/flank pain associated with coughing. She had some nausea here in the ED. She denies headaches, dizziness, prior nausea or vomiting, diarrhea, myalgias, or rashes. On review of systems, she does report dark urine and some discomfort with voiding.     Review of Systems:   Constitutional: Positive for chills and malaise/fatigue.  HENT: Negative.   Eyes: Negative.   Respiratory: Positive for cough, sputum production and shortness of breath.   Cardiovascular: Negative.   Gastrointestinal: Positive for abdominal pain. Negative for nausea, vomiting and diarrhea.  Genitourinary: Positive for dysuria.  Musculoskeletal: Negative.  Negative for myalgias.  Skin: Negative.  Negative for rash.  Neurological: Positive for weakness. Negative for dizziness, sensory change and headaches.     Medical History: Past Medical  History  Diagnosis Date  . Hypertension   . Goiter   . Arthritis   . Chest pain   . SOB (shortness of breath)   . COPD (chronic obstructive pulmonary disease)   . CHF (congestive heart failure)     Surgical History: Past Surgical History  Procedure Date  . Hysterectomy - unknown type   . Appendectomy     Home Medications: Current Outpatient Rx  Name  Route  Sig  Dispense  Refill  . IPRATROPIUM-ALBUTEROL 18-103 MCG/ACT IN AERO   Inhalation   Inhale 2 puffs into the lungs every 6 (six) hours as needed. For shortness of breath         . ASPIRIN 81 MG PO TABS   Oral   Take 81 mg by mouth daily.         . IBUPROFEN 200 MG PO TABS   Oral   Take 200 mg by mouth every 4 (four) hours as needed. For pain         . LISINOPRIL 10 MG PO TABS   Oral   Take 10 mg by mouth.         Marland Kitchen VITAMIN D (ERGOCALCIFEROL) 50000 UNITS PO CAPS   Oral   Take 50,000 Units by mouth every 14 (fourteen) days.           Allergies: Allergies as of 01/02/2012 - Review Complete 01/02/2012  Allergen Reaction Noted  . Indomethacin Other (See Comments) 01/29/2011    Family History: Family History  Problem Relation Age of Onset  . Heart disease  Social History: History   Social History  . Marital Status: Widowed    Spouse Name: N/A    Number of Children: 6  . Years of Education: N/A   Occupational History  . retired    Social History Main Topics  . Smoking status: Former Smoker    Quit date: 01/02/1991  . Smokeless tobacco: Not on file  . Alcohol Use: No  . Drug Use: No  . Sexually Active: No   Other Topics Concern  . Not on file   Social History Narrative  . No narrative on file    Physical exam: GENERAL: overweight; no acute distress HEAD: atraumatic, normocephalic EYES: pupils equal, round and reactive; sclera anicteric; normal conjunctiva EARS: canals patent and TMs normal bilaterally NOSE/THROAT: oropharynx clear, moist mucous membranes, pink gums,  dentures in place NECK: supple, no carotid bruits, thyroid normal in size and without palpable nodules LYMPH: no cervical or supraclavicular lymphadenopathy LUNGS: scattered bronchial sounds, no rales or wheezing, normal work of breathing HEART: normal rate and regular rhythm; normal S1 and S2 without S3 or S4; no murmurs, rubs, or clicks PULSES: radial 2+ and symmetric ABDOMEN: soft, tenderness with palpation of the left upper quadrant and some mild generalized tenderness, normal bowel sounds, no masses SKIN: warm, dry, intact, normal turgor, no rashes EXTREMITIES: no peripheral edema, clubbing, or cyanosis PSYCH: patient is alert and oriented, mood and affect are normal and congruent, thought content is normal without delusions, thought process is linear, speech is normal and non-pressured, behavior is normal, judgement and insight are normal     Lab results: Basic Metabolic Panel:  Basename 01/02/12 1248  NA 134*  K 3.9  CL 97  CO2 24  GLUCOSE 118*  BUN 17  CREATININE 0.91  CALCIUM 9.0  MG --  PHOS --   Liver Function Tests:  Basename 01/02/12 1248  AST 18  ALT 13  ALKPHOS 67  BILITOT 1.5*  PROT 7.3  ALBUMIN 3.3*   CBC:  Basename 01/02/12 1248  WBC 15.8*  NEUTROABS --  HGB 12.3  HCT 36.2  MCV 90.7  PLT 265    Imaging results: Dg Chest 2 View 01/02/2012   FINDINGS: Lungs are adequately inflated with mild hazy density of the lateral right base which may be due to infection versus atelectasis.  There is no effusion.  The cardiomediastinal silhouette and remainder of the exam to include calcification projected over the midline neck base is unchanged.  IMPRESSION: Hazy airspace density over the lateral right base suggesting atelectasis versus early infection.     Other results: EKG Results:  01/02/2012 Rate:  86 PR:  150 QRS:  104 QTc:  418 EKG: there are no previous tracings available for comparison, normal sinus rhythm, nonspecific ST and T waves changes,  frequent PVC's noted.   Assessment and Plan:  1.   Community acquired right lower lobe pneumonia:  Chest x-ray and symptoms consistent with lobar pneumonia.  She received a dose of IV ceftriaxone and IV azithromycin in the emergency department. We will transition to oral monotherapy with levofloxacin. She is oxygenating adequately without supplemental oxygen. - Transition to levofloxacin 750 mg daily - Streptococcus pneumoniae and Legionella pneumophilia urine antigen testing - Sputum culture and Gram stain  2.   Acute COPD exacerbation: She meets all 3 diagnostic criteria of a COPD exacerbation with increased sputum production, increased sputum purulence, and  increased dyspnea.we will give her one dose of IV steroids today and transition to 40 mg oral  prednisone tomorrow.  We will treat with an antibiotic as above and schedule nebulized ipratropium and albuterol treatments every 4 hours. - Single dose of IV methylprednisolone 40 mg - Transition to oral prednisone 40 mg daily tomorrow - Scheduled nebulized ipratropium and albuterol every 4 hours  3.   Hypertension:  Hemodynamically stable with normal blood pressures. Kidney function is normal. We will continue treatment with her home lisinopril 10 mg daily. - Continue lisinopril 10 mg daily   4.   Chronic congestive heart failure:  Stable. Patient took furosemide at one point in the past but has not for some time now. Kidney function is normal and she appears  euvolemic on exam. Because of her anorexia, we will hydrate with intravenous normal saline at 100 mL/hr. - Normal saline 100 mL/hour ending tomorrow morning  5.   Dysuria:  Concerning for urinary tract infection. This will be empirically covered with levofloxacin. - Urinalysis  6.   History of thyroid disease:  Patient has a history of a goiter with radioactive iodine ablation. We're checking a TSH. - TSH  7.   Prophylaxis:  Enoxaparin 40mg  subcutaneously daily  8.   Disposition:   Patient has an unassigned PCP in [address] and will not require OPC followup. Expected length of stay is one to 2 days. We will have PT see her.     Signed by:  Dorthula Rue. Earlene Plater, MD PGY-I, Internal Medicine  01/02/2012, 2:56 PM

## 2012-01-02 NOTE — ED Notes (Signed)
Pt is in a gown and on the monitor. 

## 2012-01-03 DIAGNOSIS — N390 Urinary tract infection, site not specified: Secondary | ICD-10-CM | POA: Diagnosis present

## 2012-01-03 DIAGNOSIS — D72829 Elevated white blood cell count, unspecified: Secondary | ICD-10-CM

## 2012-01-03 DIAGNOSIS — I493 Ventricular premature depolarization: Secondary | ICD-10-CM

## 2012-01-03 DIAGNOSIS — Z8639 Personal history of other endocrine, nutritional and metabolic disease: Secondary | ICD-10-CM

## 2012-01-03 LAB — CBC
MCH: 32.2 pg (ref 26.0–34.0)
MCHC: 35.6 g/dL (ref 30.0–36.0)
Platelets: 250 10*3/uL (ref 150–400)
RBC: 3.54 MIL/uL — ABNORMAL LOW (ref 3.87–5.11)

## 2012-01-03 LAB — BASIC METABOLIC PANEL
BUN: 21 mg/dL (ref 6–23)
Calcium: 8.7 mg/dL (ref 8.4–10.5)
GFR calc Af Amer: 75 mL/min — ABNORMAL LOW (ref >90)
GFR calc non Af Amer: 65 mL/min — ABNORMAL LOW (ref >90)
Glucose, Bld: 142 mg/dL — ABNORMAL HIGH (ref 70–99)
Sodium: 136 mEq/L (ref 135–145)

## 2012-01-03 LAB — EXPECTORATED SPUTUM ASSESSMENT W GRAM STAIN, RFLX TO RESP C

## 2012-01-03 LAB — TSH: TSH: 0.592 u[IU]/mL (ref 0.350–4.500)

## 2012-01-03 MED ORDER — PREDNISONE 20 MG PO TABS: 40.0000 mg | ORAL_TABLET | Freq: Every day | ORAL | Status: DC

## 2012-01-03 MED ORDER — LEVOFLOXACIN 500 MG PO TABS: 500.0000 mg | ORAL_TABLET | Freq: Every day | ORAL | Status: DC

## 2012-01-03 MED ORDER — LEVOFLOXACIN 750 MG PO TABS: 750.0000 mg | ORAL_TABLET | ORAL | Status: DC

## 2012-01-03 MED ORDER — IPRATROPIUM-ALBUTEROL 18-103 MCG/ACT IN AERO
2.0000 | INHALATION_SPRAY | Freq: Four times a day (QID) | RESPIRATORY_TRACT | Status: DC | PRN
  Filled 2012-01-03: qty 14.7

## 2012-01-03 MED ORDER — LEVOFLOXACIN 500 MG PO TABS
500.0000 mg | ORAL_TABLET | Freq: Every day | ORAL | Status: DC
  Filled 2012-01-03: qty 1

## 2012-01-03 MED ORDER — ALBUTEROL SULFATE HFA 108 (90 BASE) MCG/ACT IN AERS: 2.0000 | INHALATION_SPRAY | Freq: Four times a day (QID) | RESPIRATORY_TRACT | Status: DC | PRN

## 2012-01-03 NOTE — Discharge Summary (Signed)
Internal Medicine Teaching [facility] Discharge Note  Name: [name] MRN: [id number] DOB: [birth year] 79 y.o.  Date of Admission: 01/02/2012 11:54 AM Date of Discharge: 01/03/2012 Attending Physician: No att. providers found  Discharge Diagnosis: Principal Problem:  *CAP (community acquired pneumonia) Active Problems:  COPD (chronic obstructive pulmonary disease)  Hypertension  CHF (congestive heart failure)  Hx of goiter  UTI (lower urinary tract infection)  Asymptomatic PVCs  H/O: hypothyroidism  Leukocytosis   Discharge Medications:   Medication List     As of 01/03/2012  1:31 PM    TAKE these medications         albuterol 108 (90 BASE) MCG/ACT inhaler   Commonly known as: PROVENTIL HFA;VENTOLIN HFA   Inhale 2 puffs into the lungs every 6 (six) hours as needed for wheezing.      albuterol-ipratropium 18-103 MCG/ACT inhaler   Commonly known as: COMBIVENT   Inhale 2 puffs into the lungs every 6 (six) hours as needed. For shortness of breath      aspirin 81 MG tablet   Take 81 mg by mouth daily.      levofloxacin 500 MG tablet   Commonly known as: LEVAQUIN   Take 1 tablet (500 mg total) by mouth daily with supper.      lisinopril 10 MG tablet   Commonly known as: PRINIVIL,ZESTRIL   Take 10 mg by mouth.      MOTRIN IB 200 MG tablet   Generic drug: ibuprofen   Take 200 mg by mouth every 4 (four) hours as needed. For pain      predniSONE 20 MG tablet   Commonly known as: DELTASONE   Take 2 tablets (40 mg total) by mouth daily with breakfast.      Vitamin D (Ergocalciferol) 50000 UNITS Caps   Commonly known as: DRISDOL   Take 50,000 Units by mouth every 14 (fourteen) days.          Disposition and follow-up:   Jacqueline.[name] was discharged from [facility] in stable condition.  At the hospital follow up visit please address - Please review compliance with antibiotic therapy  - Please consider doing a repeat chest x ray in 6 weeks  to follow up the pneumonia  - She was discharged with orders for a walker with wheels. Please follow up on this to make sure it was delivered. - Please evaluate [name] for leukocytosis - Please consider evaluating her further for asymptomatic PVCs noted on EKG in the hospital  - Please arrange for gyne evaluation for possible Uterine vs Bladder prolapse  Follow-up Appointments: Follow-up Information    Follow up with Kaleen Mask, MD. On 01/09/2012. (at 9:15pm)    Contact information:   [address] [place] [address] [phone]         Discharge Orders    Future Orders Please Complete By Expires   Diet - low sodium heart healthy      Increase activity slowly      Call MD for:  temperature >100.4      Call MD for:  difficulty breathing, headache or visual disturbances      Call MD for:  extreme fatigue         Consultations:  None  Procedures Performed:  Dg Chest 2 View  01/02/2012  *RADIOLOGY REPORT*  Clinical Data: Cough.  CHEST - 2 VIEW  Comparison: 01/06/2007  Findings: Lungs are adequately inflated with mild hazy density of the lateral  right base which may be due to infection versus atelectasis.  There is no effusion.  The cardiomediastinal silhouette and remainder of the exam to include calcification projected over the midline neck base is unchanged.  IMPRESSION: Hazy airspace density over the lateral right base suggesting atelectasis versus early infection.   Original Report Authenticated By: Elberta Fortis, M.D.    Admission HPI:  [name] is an 79 year old female with COPD and hypertension, who presented to the emergency department with 4 days of cough productive of purulent sputum and dyspnea. Onset was 3 days prior to presentation. Presenting symptoms were fatigue, malaise, and cough producing greenish sputum. She has dyspnea at baseline, which she says felt had gotten a little bit worse. There have been sick contacts in the family. Associated symptoms  included anorexia and chills. No objective fevers measured at home. Over the last day or 2 she's had some abdominal/flank pain associated with coughing. She had some nausea here in the ED. She denies headaches, dizziness, prior nausea or vomiting, diarrhea, myalgias, or rashes. On review of systems, she does report dark urine and some discomfort with voiding. However, she reported history vaginal heaviness with walking and abdominal pressure. She feels like 'something comes out'.   Review of Systems:  Constitutional: Positive for chills and malaise/fatigue.  HENT: Negative.  Eyes: Negative.  Respiratory: Positive for cough, sputum production and shortness of breath.  Cardiovascular: Negative.  Gastrointestinal: Positive for abdominal pain. Negative for nausea, vomiting and diarrhea.  Genitourinary: Positive for dysuria.  Musculoskeletal: Negative. Negative for myalgias.  Skin: Negative. Negative for rash.  Neurological: Positive for weakness. Negative for dizziness, sensory change and headaches.  Physical exam:  GENERAL: overweight; no acute distress  HEAD: atraumatic, normocephalic  EYES: pupils equal, round and reactive; sclera anicteric; normal conjunctiva  EARS: canals patent and TMs normal bilaterally  NOSE/THROAT: oropharynx clear, moist mucous membranes, pink gums, dentures in place  NECK: supple, no carotid bruits, thyroid normal in size and without palpable nodules  LYMPH: no cervical or supraclavicular lymphadenopathy  LUNGS: scattered bronchial sounds, no rales or wheezing, normal work of breathing  HEART: normal rate and regular rhythm; normal S1 and S2 without S3 or S4; no murmurs, rubs, or clicks  PULSES: radial 2+ and symmetric  ABDOMEN: soft, tenderness with palpation of the left upper quadrant and some mild generalized tenderness, normal bowel sounds, no masses  SKIN: warm, dry, intact, normal turgor, no rashes  EXTREMITIES: no peripheral edema, clubbing, or cyanosis    PSYCH: patient is alert and oriented, mood and affect are normal and congruent, thought content is normal without delusions, thought process is linear, speech is normal and non-pressured, behavior is normal, judgement and insight are normal    Hospital Course by problem list:  Community acquired right lower lobe pneumonia: [name] presented with cough and increased shortness of breath. She also reported some subjective fever, malaise and myalgia. Chest x-ray and symptoms were consistent with lobar pneumonia. She received a dose of IV ceftriaxone and IV azithromycin in the emergency department one day prior to admission. She was transitioned to oral monotherapy with levofloxacin. She was oxygenating adequately without supplemental oxygen. Streptococcus pneumoniae and Legionella pneumophilia urine antigen were both negative. She was discharged after one day on a 6 days course for Levaquin 500 mg once daily. She will follow up with Dr Jeannetta Nap on the 01/09/2011. A follow up chest x ray will be done as outpatient.  Acute COPD exacerbation: On presentation [name] met all 3  diagnostic criteria of a COPD exacerbation with increased sputum production, increased sputum purulence, and increased dyspnea. She was treated with one dose of IV steroids and transitioned her to 40 mg oral prednisone. She was then discharged on 40 mg once daily for 3 days. Her usual medications including Combivent an and Salbutamol inhaler were restarted. She will follow up with outpatient.  Urinary tract infection: [name] complained of dysuria and a urinalysis revealed a urinary tract infection. This has been treated with levofloxacin prescribed for the CAP above.  Genital prolapse: [name] hinted that sometimes she feels pressure in her vaginal especially with abdominal straining. She also reported a sensation of something pushing down through her vagina. She has never been evaluated for this. She has a history of 6 vaginal child-births  which indicated an increased risk for uterine versus bladder prolapse. Even though she does not wish to have a surgical intervention, she is interested in evaluation by a gynecologist.  Leukocytosis: Patient was noted to have high WBC count spanning as far back as 3 years ago. It is unclear the etiology this could be and she will at least require outpatient evaluation to rule possible causes of this. She is not chronically using steroids. She would need a repeat CBC once her pneumonia has resolved since this can be a cause of increased WBC.  Asymptomatic PVCs: A routine EKG in the hospital indicated non-sustatined PVCs. She was asymptomatic as the time without chest pains, orthopnea or palpitation. Potassium was normal. She will require outpatient follow up on this.  Discharge Vitals:  BP 99/60  Pulse 74  Temp 97.4 F (36.3 C) (Oral)  Resp 18  Ht 5\' 1"  (1.549 m)  Wt 155 lb (70.308 kg)  BMI 29.29 kg/m2  SpO2 100%  Discharge Labs:  Results for orders placed during the hospital encounter of 01/02/12 (from the past 24 hour(s))  INFLUENZA PANEL BY PCR     Status: Normal   Collection Time   01/02/12  1:43 PM      Component Value Range   Influenza A By PCR NEGATIVE  NEGATIVE   Influenza B By PCR NEGATIVE  NEGATIVE   H1N1 flu by pcr NOT DETECTED  NOT DETECTED  URINALYSIS, ROUTINE W REFLEX MICROSCOPIC     Status: Abnormal   Collection Time   01/02/12  3:36 PM      Component Value Range   Color, Urine ORANGE (*) YELLOW   APPearance CLOUDY (*) CLEAR   Specific Gravity, Urine 1.027  1.005 - 1.030   pH 5.0  5.0 - 8.0   Glucose, UA NEGATIVE  NEGATIVE mg/dL   Hgb urine dipstick MODERATE (*) NEGATIVE   Bilirubin Urine SMALL (*) NEGATIVE   Ketones, ur 15 (*) NEGATIVE mg/dL   Protein, ur 161 (*) NEGATIVE mg/dL   Urobilinogen, UA 1.0  0.0 - 1.0 mg/dL   Nitrite POSITIVE (*) NEGATIVE   Leukocytes, UA LARGE (*) NEGATIVE  URINE MICROSCOPIC-ADD ON     Status: Abnormal   Collection Time   01/02/12  3:36 PM       Component Value Range   Squamous Epithelial / LPF FEW (*) RARE   WBC, UA 7-10  <3 WBC/hpf   RBC / HPF 0-2  <3 RBC/hpf   Bacteria, UA FEW (*) RARE  STREP PNEUMONIAE URINARY ANTIGEN     Status: Normal   Collection Time   01/02/12  3:37 PM      Component Value Range   Strep Pneumo Urinary  Antigen NEGATIVE  NEGATIVE  CULTURE, EXPECTORATED SPUTUM-ASSESSMENT     Status: Normal   Collection Time   01/03/12  6:00 AM      Component Value Range   Specimen Description SPUTUM     Special Requests Normal     Sputum evaluation       Value: THIS SPECIMEN IS ACCEPTABLE. RESPIRATORY CULTURE REPORT TO FOLLOW.   Report Status 01/03/2012 FINAL    BASIC METABOLIC PANEL     Status: Abnormal   Collection Time   01/03/12  6:40 AM      Component Value Range   Sodium 136  135 - 145 mEq/L   Potassium 3.6  3.5 - 5.1 mEq/L   Chloride 100  96 - 112 mEq/L   CO2 23  19 - 32 mEq/L   Glucose, Bld 142 (*) 70 - 99 mg/dL   BUN 21  6 - 23 mg/dL   Creatinine, Ser 1.61  0.50 - 1.10 mg/dL   Calcium 8.7  8.4 - 09.6 mg/dL   GFR calc non Af Amer 65 (*) >90 mL/min   GFR calc Af Amer 75 (*) >90 mL/min  CBC     Status: Abnormal   Collection Time   01/03/12  6:40 AM      Component Value Range   WBC 16.5 (*) 4.0 - 10.5 K/uL   RBC 3.54 (*) 3.87 - 5.11 MIL/uL   Hemoglobin 11.4 (*) 12.0 - 15.0 g/dL   HCT 04.5 (*) 40.9 - 81.1 %   MCV 90.4  78.0 - 100.0 fL   MCH 32.2  26.0 - 34.0 pg   MCHC 35.6  30.0 - 36.0 g/dL   RDW 91.4  78.2 - 95.6 %   Platelets 250  150 - 400 K/uL  TSH     Status: Normal   Collection Time   01/03/12  6:40 AM      Component Value Range   TSH 0.592  0.350 - 4.500 uIU/mL    Signed: Dow Adolph 01/03/2012, 1:31 PM   Time Spent on Discharge: 50 minutes  Services Ordered on Discharge: none Equipment Ordered on Discharge: Walker with rolling wheels

## 2012-01-03 NOTE — Progress Notes (Signed)
Brought pt rolling walker that was ordered, and refused rolling walker.  Pt wants rollator walker and will pick up at the store.   Orlan Leavens [phone]

## 2012-01-03 NOTE — H&P (Signed)
Internal Medicine Teaching Service Attending Note Date: 01/03/2012  Patient name: [name]  Medical record number: [id number]  Date of birth: 04/21/1925   I have seen and evaluated [name] and discussed their care with the Residency Team.    [name] is an 79yo woman who presented to the [facility] ED with her family complaining of a 4-5 day history of cough productive of greenish sputum, SOB, chest pain with the cough, malaise and decreased PO intake.  She has SOB at baseline, however, this has worsened.  She reports also sick contacts in the family.  She has a history of COPD for which she takes occasional combivent.  Associated symptoms included decreased appetite, subjective fevers (she has not measured them at home.).  She also had some nausea in the ED, however, she has not had vomiting, diarrhea, abdominal pain or constipation.  She further denies dizziness, myalgia, headache, rash, change in vision.   [name] also complains of burning on urination for about a month and more recently some dark urine as well as a full sensation between her legs when walking, which she states feels like her "bladder wants to come out."  She has had 6 children and the symptoms have been ongoing for while now.  She has been concerned that she had a UTI, but she had a UA at her PCP which was apparently normal a few weeks ago.   Further PMH, PSH, meds, allergies and ROS per resident note.   Physical Exam: Blood pressure 99/60, pulse 74, temperature 97.4 F (36.3 C), temperature source Oral, resp. rate 18, height 5\' 1"  (1.549 m), weight 155 lb (70.308 kg), SpO2 100.00%. General appearance: alert, cooperative and appears stated age Head: Normocephalic, without obvious abnormality, atraumatic Eyes: EOMI, anicteric sclera Lungs: + rhonchi in left lung base, occasional expiratory wheeze, otherwise clear Heart: RR, NR, no murmur noted, no rub Abdomen: Soft, NT, ND, +BS Skin: no rash noted Neurologic: Grossly  normal  Lab results: Results for orders placed during the hospital encounter of 01/02/12 (from the past 24 hour(s))  CBC     Status: Abnormal   Collection Time   01/02/12 12:48 PM      Component Value Range   WBC 15.8 (*) 4.0 - 10.5 K/uL   RBC 3.99  3.87 - 5.11 MIL/uL   Hemoglobin 12.3  12.0 - 15.0 g/dL   HCT 46.9  62.9 - 52.8 %   MCV 90.7  78.0 - 100.0 fL   MCH 30.8  26.0 - 34.0 pg   MCHC 34.0  30.0 - 36.0 g/dL   RDW 41.3  24.4 - 01.0 %   Platelets 265  150 - 400 K/uL  COMPREHENSIVE METABOLIC PANEL     Status: Abnormal   Collection Time   01/02/12 12:48 PM      Component Value Range   Sodium 134 (*) 135 - 145 mEq/L   Potassium 3.9  3.5 - 5.1 mEq/L   Chloride 97  96 - 112 mEq/L   CO2 24  19 - 32 mEq/L   Glucose, Bld 118 (*) 70 - 99 mg/dL   BUN 17  6 - 23 mg/dL   Creatinine, Ser 2.72  0.50 - 1.10 mg/dL   Calcium 9.0  8.4 - 53.6 mg/dL   Total Protein 7.3  6.0 - 8.3 g/dL   Albumin 3.3 (*) 3.5 - 5.2 g/dL   AST 18  0 - 37 U/L   ALT 13  0 - 35  U/L   Alkaline Phosphatase 67  39 - 117 U/L   Total Bilirubin 1.5 (*) 0.3 - 1.2 mg/dL   GFR calc non Af Amer 56 (*) >90 mL/min   GFR calc Af Amer 65 (*) >90 mL/min  INFLUENZA PANEL BY PCR     Status: Normal   Collection Time   01/02/12  1:43 PM      Component Value Range   Influenza A By PCR NEGATIVE  NEGATIVE   Influenza B By PCR NEGATIVE  NEGATIVE   H1N1 flu by pcr NOT DETECTED  NOT DETECTED  URINALYSIS, ROUTINE W REFLEX MICROSCOPIC     Status: Abnormal   Collection Time   01/02/12  3:36 PM      Component Value Range   Color, Urine ORANGE (*) YELLOW   APPearance CLOUDY (*) CLEAR   Specific Gravity, Urine 1.027  1.005 - 1.030   pH 5.0  5.0 - 8.0   Glucose, UA NEGATIVE  NEGATIVE mg/dL   Hgb urine dipstick MODERATE (*) NEGATIVE   Bilirubin Urine SMALL (*) NEGATIVE   Ketones, ur 15 (*) NEGATIVE mg/dL   Protein, ur 161 (*) NEGATIVE mg/dL   Urobilinogen, UA 1.0  0.0 - 1.0 mg/dL   Nitrite POSITIVE (*) NEGATIVE   Leukocytes, UA LARGE (*)  NEGATIVE  URINE MICROSCOPIC-ADD ON     Status: Abnormal   Collection Time   01/02/12  3:36 PM      Component Value Range   Squamous Epithelial / LPF FEW (*) RARE   WBC, UA 7-10  <3 WBC/hpf   RBC / HPF 0-2  <3 RBC/hpf   Bacteria, UA FEW (*) RARE  STREP PNEUMONIAE URINARY ANTIGEN     Status: Normal   Collection Time   01/02/12  3:37 PM      Component Value Range   Strep Pneumo Urinary Antigen NEGATIVE  NEGATIVE  BASIC METABOLIC PANEL     Status: Abnormal   Collection Time   01/03/12  6:40 AM      Component Value Range   Sodium 136  135 - 145 mEq/L   Potassium 3.6  3.5 - 5.1 mEq/L   Chloride 100  96 - 112 mEq/L   CO2 23  19 - 32 mEq/L   Glucose, Bld 142 (*) 70 - 99 mg/dL   BUN 21  6 - 23 mg/dL   Creatinine, Ser 0.96  0.50 - 1.10 mg/dL   Calcium 8.7  8.4 - 04.5 mg/dL   GFR calc non Af Amer 65 (*) >90 mL/min   GFR calc Af Amer 75 (*) >90 mL/min  CBC     Status: Abnormal   Collection Time   01/03/12  6:40 AM      Component Value Range   WBC 16.5 (*) 4.0 - 10.5 K/uL   RBC 3.54 (*) 3.87 - 5.11 MIL/uL   Hemoglobin 11.4 (*) 12.0 - 15.0 g/dL   HCT 40.9 (*) 81.1 - 91.4 %   MCV 90.4  78.0 - 100.0 fL   MCH 32.2  26.0 - 34.0 pg   MCHC 35.6  30.0 - 36.0 g/dL   RDW 78.2  95.6 - 21.3 %   Platelets 250  150 - 400 K/uL    Imaging results:  Dg Chest 2 View  01/02/2012  *RADIOLOGY REPORT*  Clinical Data: Cough.  CHEST - 2 VIEW  Comparison: 01/06/2007  Findings: Lungs are adequately inflated with mild hazy density of the lateral right base which may be due to  infection versus atelectasis.  There is no effusion.  The cardiomediastinal silhouette and remainder of the exam to include calcification projected over the midline neck base is unchanged.  IMPRESSION: Hazy airspace density over the lateral right base suggesting atelectasis versus early infection.   Original Report Authenticated By: Elberta Fortis, M.D.     Assessment and Plan: I agree with the formulated Assessment and Plan with the following  changes:   1. CAP  - Due to the patients constellation of symptoms and CXR with concerning hazy density, this is the most likely diagnosis - She has been treated with one dose of IV antibiotics with rocephin and zithromax and transitioned to Levaquin - PT will evaluate for safety at home - She will have Abx for a 7-8 day course for pneumonia - Urine studies for strep negative and for legionella pending - BC X 2  2. Acute COPD exacerbation - Oral steroids for short course - Scheduled nebs while in house, transition to combivent once discharged  3. UTI - UC pending - Levaquin should also cover for urinary bugs - Follow up on UC  4. Uterine vs. Urinary bladder prolapse - This can be further evaluated as an outpatient - PCP follow up to be scheduled - Discussed options including but not limited to Surgery (she is not interested) or pessary support - She may need Gynecological follow up as an outpatient.   Other issues as per resident note.   Inez Catalina, MD 1/2/201410:15 AM

## 2012-01-03 NOTE — Progress Notes (Signed)
Subjective: She feels better today. She is looking forward to [place]. Her shortness of breath has improved.  Objective: Vital signs in last 24 hours: Filed Vitals:   01/03/12 0110 01/03/12 0510 01/03/12 0547 01/03/12 1004  BP:   116/50 99/60  Pulse:   74   Temp:   97.4 F (36.3 C)   TempSrc:   Oral   Resp:   18   Height:      Weight:      SpO2: 95% 96% 100%    Weight change:   Intake/Output Summary (Last 24 hours) at 01/03/12 1010 Last data filed at 01/03/12 0958  Gross per 24 hour  Intake 1350.16 ml  Output      0 ml  Net 1350.16 ml   General appearance: alert, cooperative and mild distress Head: Normocephalic, without obvious abnormality, atraumatic Lungs: clear to auscultation bilaterally Heart: regular rate and rhythm, S1, S2 normal, no murmur, click, rub or gallop Abdomen: soft, non-tender; bowel sounds normal; no masses,  no organomegaly Extremities: extremities normal, atraumatic, no cyanosis or edema Pulses: 2+ and symmetric Lab Results: Basic Metabolic Panel:  Lab 01/03/12 0981 01/02/12 1248  NA 136 134*  K 3.6 3.9  CL 100 97  CO2 23 24  GLUCOSE 142* 118*  BUN 21 17  CREATININE 0.81 0.91  CALCIUM 8.7 9.0  MG -- --  PHOS -- --   Liver Function Tests:  Lab 01/02/12 1248  AST 18  ALT 13  ALKPHOS 67  BILITOT 1.5*  PROT 7.3  ALBUMIN 3.3*   No results found for this basename: LIPASE:2,AMYLASE:2 in the last 168 hours No results found for this basename: AMMONIA:2 in the last 168 hours CBC:  Lab 01/03/12 0640 01/02/12 1248  WBC 16.5* 15.8*  NEUTROABS -- --  HGB 11.4* 12.3  HCT 32.0* 36.2  MCV 90.4 90.7  PLT 250 265   Cardiac Enzymes: No results found for this basename: CKTOTAL:3,CKMB:3,CKMBINDEX:3,TROPONINI:3 in the last 168 hours BNP: No results found for this basename: PROBNP:3 in the last 168 hours D-Dimer: No results found for this basename: DDIMER:2 in the last 168 hours CBG: No results found for this basename: GLUCAP:6 in the last  168 hours Hemoglobin A1C: No results found for this basename: HGBA1C in the last 168 hours Fasting Lipid Panel: No results found for this basename: CHOL,HDL,LDLCALC,TRIG,CHOLHDL,LDLDIRECT in the last 191 hours Thyroid Function Tests: No results found for this basename: TSH,T4TOTAL,FREET4,T3FREE,THYROIDAB in the last 168 hours Coagulation: No results found for this basename: LABPROT:4,INR:4 in the last 168 hours Anemia Panel: No results found for this basename: VITAMINB12,FOLATE,FERRITIN,TIBC,IRON,RETICCTPCT in the last 168 hours Urine Drug Screen: Drugs of Abuse     Component Value Date/Time   LABOPIA NONE DETECTED 01/03/2007 0006   COCAINSCRNUR NONE DETECTED 01/03/2007 0006   LABBENZ NONE DETECTED 01/03/2007 0006   AMPHETMU NONE DETECTED 01/03/2007 0006   THCU NONE DETECTED 01/03/2007 0006   LABBARB  Value: NONE DETECTED        DRUG SCREEN FOR MEDICAL PURPOSES ONLY.  IF CONFIRMATION IS NEEDED FOR ANY PURPOSE, NOTIFY LAB WITHIN 5 DAYS.        LOWEST DETECTABLE LIMITS FOR URINE DRUG SCREEN Drug Class       Cutoff (ng/mL) Amphetamine      1000 Barbiturate      200 Benzodiazepine   200 Cocaine          300 Opiates          300 THC  50 01/03/2007 0006    Alcohol Level: No results found for this basename: ETH:2 in the last 168 hours Urinalysis:  Lab 01/02/12 1536  COLORURINE ORANGE*  LABSPEC 1.027  PHURINE 5.0  GLUCOSEU NEGATIVE  HGBUR MODERATE*  BILIRUBINUR SMALL*  KETONESUR 15*  PROTEINUR 100*  UROBILINOGEN 1.0  NITRITE POSITIVE*  LEUKOCYTESUR LARGE*   Misc. Labs:   Micro Results: No results found for this or any previous visit (from the past 240 hour(s)). Studies/Results: Dg Chest 2 View  01/02/2012  *RADIOLOGY REPORT*  Clinical Data: Cough.  CHEST - 2 VIEW  Comparison: 01/06/2007  Findings: Lungs are adequately inflated with mild hazy density of the lateral right base which may be due to infection versus atelectasis.  There is no effusion.  The cardiomediastinal silhouette  and remainder of the exam to include calcification projected over the midline neck base is unchanged.  IMPRESSION: Hazy airspace density over the lateral right base suggesting atelectasis versus early infection.   Original Report Authenticated By: Elberta Fortis, M.D.    Medications: I have reviewed the patient's current medications. Scheduled Meds:   . aspirin EC  81 mg Oral Daily  . enoxaparin (LOVENOX) injection  40 mg Subcutaneous Q24H  . levofloxacin  750 mg Oral Q48H  . lisinopril  10 mg Oral Daily  . predniSONE  40 mg Oral Q breakfast   Continuous Infusions:  PRN Meds:.albuterol-ipratropium Assessment/Plan: Community acquired right lower lobe pneumonia: Ms Aldape presented with cough and increased shortness of breath. She also reported some subjective fever, malaise and myalgia. Chest x-ray and symptoms were consistent with lobar pneumonia. She received a dose of IV ceftriaxone and IV azithromycin in the emergency department one day prior to admission. She was transitioned to oral monotherapy with levofloxacin. She was oxygenating adequately without supplemental oxygen. Streptococcus pneumoniae and Legionella pneumophilia urine antigen were both negative. She was discharged after one day on a 6 days course for Levaquin 500 mg once daily. She will follow up with Dr Jeannetta Nap on the 01/09/2011. A follow up chest x ray will be done as outpatient.  Acute COPD exacerbation: On presentation Ms Marcom met all 3 diagnostic criteria of a COPD exacerbation with increased sputum production, increased sputum purulence, and increased dyspnea. She was treated with one dose of IV steroids and transitioned her to 40 mg oral prednisone. She was then discharged on 40 mg once daily for 3 days. Her usual medications including Combivent an and Salbutamol inhaler were restarted. She will follow up with outpatient.  Urinary tract infection: Ms Labell complained of dysuria and a urinalysis revealed a urinary tract infection. This  has been treated with levofloxacin prescribed for the CAP above.  Genital prolapse: Ms Tufano hinted that sometimes she feels pressure in her vaginal especially with abdominal straining. She also reported a sensation of something pushing down through her vagina. She has never been evaluated for this. She has a history of 6 vaginal child-births which indicated an increased risk for uterine versus bladder prolapse. Even though she does not wish to have a surgical intervention, she is interested in evaluation by a gynecologist.  Leukocytosis: Patient was noted to have high WBC count spanning as far back as 3 years ago. It is unclear the etiology this could be and she will at least require outpatient evaluation to rule possible causes of this. She is not chronically using steroids. She would need a repeat CBC once her pneumonia has resolved since this can be a cause of increased WBC.  Asymptomatic PVCs: A routine EKG in the hospital indicated non-sustatined PVCs. She was asymptomatic as the time without chest pains, orthopnea or palpitation. Potassium was normal. She will require outpatient follow up on this.    Dispo: Disposition is deferred at this time, awaiting improvement of current medical problems.  Anticipated discharge in approximately 0 day(s).   The patient I have reviewed the patient's current medications. have a current PCP Kaleen Mask, MD), therefore will not be requiring OPC follow-up after discharge.   The patient does not have transportation limitations that hinder transportation to clinic appointments.  .Services Needed at time of discharge: Y = Yes, Blank = No PT:   OT:   RN:   Equipment: Dan Humphreys   Other:     LOS: 1 day   Dow Adolph 01/03/2012, 10:10 AM

## 2012-01-03 NOTE — Evaluation (Signed)
Physical Therapy Evaluation Patient Details Name: [name] MRN: [id number] DOB: 1925-01-25 Today's Date: 01/03/2012 Time: 8657-8469 PT Time Calculation (min): 19 min  PT Assessment / Plan / Recommendation Clinical Impression  Patient is pleasent and cooperative 79 yr old female who presents with pneumonia and fatigue.  Patient appears to be at baseline for activity and mobility at this time. Feel pt will benefit from use of rw for safety and stability with ambulation.      PT Assessment  Patent does not need any further PT services    Follow Up Recommendations  No PT follow up          Equipment Recommendations  Rolling walker with 5" wheels          Precautions / Restrictions Restrictions Weight Bearing Restrictions: No         Mobility  Bed Mobility Bed Mobility: Not assessed Transfers Transfers: Sit to Stand;Stand to Sit Sit to Stand: 6: Modified independent (Device/Increase time);From bed Stand to Sit: 6: Modified independent (Device/Increase time);To bed Details for Transfer Assistance: Patient steady with standing Ambulation/Gait Ambulation/Gait Assistance: 5: Supervision Ambulation Distance (Feet): 150 Feet Assistive device: Other (Comment) (uses rail or furniture for safe ambulation, recommend use of) Ambulation/Gait Assistance Details: Mildly unsteady with fatigue; multiple rest breaks required Gait Pattern: Step-through pattern;Decreased stride length;Narrow base of support;Trunk flexed Gait velocity: decreased General Gait Details: Pt is mildly unsteady but able to self correct; feel patient may require use of rw for longer distance ambulation Stairs: Yes Stairs Assistance: 4: Min guard Stair Management Technique: One rail Right;Forwards Number of Stairs: 2       PT Goals Acute Rehab PT Goals PT Goal Formulation: With patient Time For Goal Achievement: 01/10/12 Potential to Achieve Goals: Good Pt will go Sit to Stand: Independently;with modified  independence PT Goal: Sit to Stand - Progress: Met Pt will go Stand to Sit: Independently;with modified independence PT Goal: Stand to Sit - Progress: Met Pt will Ambulate: >150 feet;with modified independence PT Goal: Ambulate - Progress: Progressing toward goal Pt will Go Up / Down Stairs: 1-2 stairs;with min assist PT Goal: Up/Down Stairs - Progress: Met  Visit Information  Last PT Received On: 01/03/12 Assistance Needed: +1    Subjective Data  Subjective: I feel like the breathing treatments really help Patient Stated Goal: to go home   Prior Functioning  Home Living Lives With: Alone Available Help at Discharge: Family;Available PRN/intermittently (son comes over every morning) Type of Home: House Home Access: Stairs to enter [employer] of Steps: 2 Entrance Stairs-Rails: None Home Layout: One level Bathroom Shower/Tub: Engineer, manufacturing systems: Handicapped height Home Adaptive Equipment: Bedside commode/3-in-1 Prior Function Level of Independence: Independent Able to Take Stairs?: Yes Driving: Yes Vocation: Retired Musician: No difficulties Dominant Hand: Right    Cognition  Overall Cognitive Status: Appears within functional limits for tasks assessed/performed Arousal/Alertness: Awake/alert Orientation Level: Appears intact for tasks assessed;Oriented X4 / Intact Behavior During Session: [facility] for tasks performed    Extremity/Trunk Assessment Right Upper Extremity Assessment RUE ROM/Strength/Tone: Deficits;Due to pain (previous shoulder injury resulting in limited ROM) RUE Sensation: WFL - Light Touch;WFL - Proprioception RUE Coordination: WFL - gross/fine motor Left Upper Extremity Assessment LUE ROM/Strength/Tone: Within functional levels LUE Sensation: WFL - Light Touch;WFL - Proprioception LUE Coordination: WFL - gross/fine motor Right Lower Extremity Assessment RLE ROM/Strength/Tone: WFL for tasks assessed Left Lower  Extremity Assessment LLE ROM/Strength/Tone: [facility] for tasks assessed   Balance    End  of Session PT - End of Session Equipment Utilized During Treatment: Gait belt Activity Tolerance: Patient tolerated treatment well Patient left: in bed;with family/visitor present (sitting EOB speaking with doctor) Nurse Communication: Mobility status (need rw)  GP Functional Assessment Tool Used: clinical judgement Functional Limitation: Mobility: Walking and moving around Mobility: Walking and Moving Around Current Status ([id number]): At least 1 percent but less than 20 percent impaired, limited or restricted Mobility: Walking and Moving Around Goal Status [phone]): At least 1 percent but less than 20 percent impaired, limited or restricted   [name] 01/03/2012, 9:48 AM [name], PT DPT  [phone]

## 2012-01-04 LAB — URINE CULTURE

## 2012-01-09 LAB — LEGIONELLA ANTIGEN, URINE: Legionella Antigen, Urine: NEGATIVE

## 2012-01-09 LAB — CULTURE, BLOOD (ROUTINE X 2): Culture: NO GROWTH

## 2012-01-14 ENCOUNTER — Ambulatory Visit: Payer: Medicare Other

## 2012-01-14 ENCOUNTER — Ambulatory Visit
Admission: RE | Admit: 2012-01-14 | Discharge: 2012-01-14 | Disposition: A | Discharge status: Home / Self Care | Payer: Medicare Other | Source: Ambulatory Visit | Attending: Family Medicine | Admitting: Family Medicine

## 2012-01-14 ENCOUNTER — Encounter (HOSPITAL_COMMUNITY): Payer: Self-pay | Admitting: *Deleted

## 2012-01-14 ENCOUNTER — Inpatient Hospital Stay (HOSPITAL_COMMUNITY)
Admission: EM | Admit: 2012-01-14 | Discharge: 2012-01-22 | DRG: 391 | Disposition: A | Discharge status: Home / Self Care | Payer: Medicare Other | Attending: Internal Medicine | Admitting: Internal Medicine

## 2012-01-14 ENCOUNTER — Ambulatory Visit (INDEPENDENT_AMBULATORY_CARE_PROVIDER_SITE_OTHER): Payer: Medicare Other | Admitting: Family Medicine

## 2012-01-14 VITALS — BP 160/80 | HR 77 | Temp 98.7°F | Resp 16 | Ht 61.5 in | Wt 156.0 lb

## 2012-01-14 DIAGNOSIS — K5732 Diverticulitis of large intestine without perforation or abscess without bleeding: Principal | ICD-10-CM | POA: Diagnosis present

## 2012-01-14 DIAGNOSIS — M19019 Primary osteoarthritis, unspecified shoulder: Secondary | ICD-10-CM | POA: Diagnosis present

## 2012-01-14 DIAGNOSIS — K651 Peritoneal abscess: Secondary | ICD-10-CM | POA: Diagnosis present

## 2012-01-14 DIAGNOSIS — K6389 Other specified diseases of intestine: Secondary | ICD-10-CM | POA: Diagnosis present

## 2012-01-14 DIAGNOSIS — I10 Essential (primary) hypertension: Secondary | ICD-10-CM | POA: Diagnosis present

## 2012-01-14 DIAGNOSIS — R918 Other nonspecific abnormal finding of lung field: Secondary | ICD-10-CM | POA: Diagnosis present

## 2012-01-14 DIAGNOSIS — J449 Chronic obstructive pulmonary disease, unspecified: Secondary | ICD-10-CM | POA: Diagnosis not present

## 2012-01-14 DIAGNOSIS — J4489 Other specified chronic obstructive pulmonary disease: Secondary | ICD-10-CM | POA: Diagnosis present

## 2012-01-14 DIAGNOSIS — E871 Hypo-osmolality and hyponatremia: Secondary | ICD-10-CM | POA: Diagnosis present

## 2012-01-14 DIAGNOSIS — K5720 Diverticulitis of large intestine with perforation and abscess without bleeding: Secondary | ICD-10-CM

## 2012-01-14 DIAGNOSIS — Z7982 Long term (current) use of aspirin: Secondary | ICD-10-CM

## 2012-01-14 DIAGNOSIS — K5792 Diverticulitis of intestine, part unspecified, without perforation or abscess without bleeding: Secondary | ICD-10-CM

## 2012-01-14 DIAGNOSIS — R3129 Other microscopic hematuria: Secondary | ICD-10-CM | POA: Diagnosis present

## 2012-01-14 DIAGNOSIS — J189 Pneumonia, unspecified organism: Secondary | ICD-10-CM

## 2012-01-14 DIAGNOSIS — Z87891 Personal history of nicotine dependence: Secondary | ICD-10-CM

## 2012-01-14 DIAGNOSIS — R109 Unspecified abdominal pain: Secondary | ICD-10-CM

## 2012-01-14 DIAGNOSIS — I509 Heart failure, unspecified: Secondary | ICD-10-CM | POA: Diagnosis present

## 2012-01-14 HISTORY — DX: Peritoneal abscess: K65.1

## 2012-01-14 HISTORY — DX: Other specified diseases of intestine: K63.89

## 2012-01-14 HISTORY — DX: Diverticulitis of large intestine with perforation and abscess without bleeding: K57.20

## 2012-01-14 LAB — COMPREHENSIVE METABOLIC PANEL
ALT: 11 U/L (ref 0–35)
BUN: 12 mg/dL (ref 6–23)
CO2: 23 mEq/L (ref 19–32)
Calcium: 9.2 mg/dL (ref 8.4–10.5)
Creatinine, Ser: 0.75 mg/dL (ref 0.50–1.10)
GFR calc Af Amer: 88 mL/min — ABNORMAL LOW (ref >90)
GFR calc non Af Amer: 76 mL/min — ABNORMAL LOW (ref >90)
Glucose, Bld: 100 mg/dL — ABNORMAL HIGH (ref 70–99)
Total Protein: 7.2 g/dL (ref 6.0–8.3)

## 2012-01-14 LAB — POCT UA - MICROSCOPIC ONLY: Yeast, UA: NEGATIVE

## 2012-01-14 LAB — POCT URINALYSIS DIPSTICK
Bilirubin, UA: NEGATIVE
Glucose, UA: NEGATIVE
Leukocytes, UA: NEGATIVE
Nitrite, UA: NEGATIVE
pH, UA: 5.5

## 2012-01-14 LAB — PROTIME-INR
INR: 1.38 (ref 0.00–1.49)
Prothrombin Time: 16.6 seconds — ABNORMAL HIGH (ref 11.6–15.2)

## 2012-01-14 LAB — POCT CBC
Lymph, poc: 1.5 (ref 0.6–3.4)
MCHC: 30.4 g/dL — AB (ref 31.8–35.4)
MPV: 8.5 fL (ref 0–99.8)
POC Granulocyte: 10.2 — AB (ref 2–6.9)
POC LYMPH PERCENT: 12.1 %L (ref 10–50)
POC MID %: 6.2 %M (ref 0–12)
Platelet Count, POC: 341 10*3/uL (ref 142–424)
RDW, POC: 12.9 %

## 2012-01-14 LAB — CBC WITH DIFFERENTIAL/PLATELET
Eosinophils Absolute: 0 10*3/uL (ref 0.0–0.7)
Eosinophils Relative: 0 % (ref 0–5)
HCT: 36 % (ref 36.0–46.0)
Lymphocytes Relative: 8 % — ABNORMAL LOW (ref 12–46)
Lymphs Abs: 1.1 10*3/uL (ref 0.7–4.0)
MCH: 30.7 pg (ref 26.0–34.0)
MCV: 90.7 fL (ref 78.0–100.0)
Monocytes Absolute: 1.1 10*3/uL — ABNORMAL HIGH (ref 0.1–1.0)
Monocytes Relative: 9 % (ref 3–12)
Platelets: 334 10*3/uL (ref 150–400)
RBC: 3.97 MIL/uL (ref 3.87–5.11)
WBC: 12.7 10*3/uL — ABNORMAL HIGH (ref 4.0–10.5)

## 2012-01-14 MED ORDER — ALBUTEROL SULFATE HFA 108 (90 BASE) MCG/ACT IN AERS
2.0000 | INHALATION_SPRAY | Freq: Four times a day (QID) | RESPIRATORY_TRACT | Status: DC | PRN
  Administered 2012-01-20 (×2): 2 via RESPIRATORY_TRACT
  Filled 2012-01-14: qty 6.7

## 2012-01-14 MED ORDER — SODIUM CHLORIDE 0.9 % IV SOLN
1.0000 g | Freq: Once | INTRAVENOUS | Status: AC
  Administered 2012-01-14: 1 g via INTRAVENOUS
  Filled 2012-01-14: qty 1

## 2012-01-14 MED ORDER — MORPHINE SULFATE 2 MG/ML IJ SOLN
2.0000 mg | Freq: Once | INTRAMUSCULAR | Status: AC
  Administered 2012-01-14: 2 mg via INTRAVENOUS
  Filled 2012-01-14: qty 1

## 2012-01-14 MED ORDER — MORPHINE SULFATE 2 MG/ML IJ SOLN
2.0000 mg | INTRAMUSCULAR | Status: DC | PRN
  Administered 2012-01-14 – 2012-01-19 (×6): 2 mg via INTRAVENOUS
  Filled 2012-01-14 (×5): qty 1

## 2012-01-14 MED ORDER — ONDANSETRON HCL 4 MG PO TABS: 4.0000 mg | ORAL_TABLET | Freq: Four times a day (QID) | ORAL | Status: DC | PRN

## 2012-01-14 MED ORDER — ONDANSETRON HCL 4 MG/2ML IJ SOLN
4.0000 mg | Freq: Four times a day (QID) | INTRAMUSCULAR | Status: DC | PRN
  Administered 2012-01-14 – 2012-01-18 (×4): 4 mg via INTRAVENOUS
  Filled 2012-01-14 (×3): qty 2

## 2012-01-14 MED ORDER — IOHEXOL 300 MG/ML  SOLN
100.0000 mL | Freq: Once | INTRAMUSCULAR | Status: AC | PRN
  Administered 2012-01-14: 100 mL via INTRAVENOUS

## 2012-01-14 MED ORDER — ONDANSETRON HCL 4 MG/2ML IJ SOLN
4.0000 mg | Freq: Once | INTRAMUSCULAR | Status: AC
  Administered 2012-01-14: 4 mg via INTRAVENOUS
  Filled 2012-01-14: qty 2

## 2012-01-14 MED ORDER — SODIUM CHLORIDE 0.9 % IV SOLN
INTRAVENOUS | Status: DC
  Administered 2012-01-14: 1000 mL via INTRAVENOUS
  Administered 2012-01-15: 04:00:00 via INTRAVENOUS

## 2012-01-14 MED ORDER — IPRATROPIUM-ALBUTEROL 18-103 MCG/ACT IN AERO
2.0000 | INHALATION_SPRAY | Freq: Four times a day (QID) | RESPIRATORY_TRACT | Status: DC | PRN
  Administered 2012-01-18 – 2012-01-19 (×2): 2 via RESPIRATORY_TRACT
  Filled 2012-01-14: qty 14.7

## 2012-01-14 NOTE — H&P (Signed)
Hospital Admission Note Date: 01/14/2012  Patient name: [name] Medical record number: [id number] Date of birth: 1925/03/17 Age: 79 y.o. Gender: female PCP: Kaleen Mask, MD   Service:  Internal Medicine Teaching Service   Attending Physician:  Dr. Debe Coder    Chief Complaint:  Abdominal pain    History of Present Illness:  This is an 79 year old woman with hypertension and COPD who was recently discharged from the hospital, following treatment for pneumonia. She was discharged on January 2. Even while she was in the hospital, she had intermittent abdominal discomfort. This continued after she went home, but it acutely worsened 4 days ago; and she has been having severe, 10 out of 10 pain in her abdomen. The pain quality was described as "so sore". The location was described as throughout the abdomen but worse in the periumbilical area. For the past 4 days, the pain has been persistent.   Associated symptoms include some fatigue since leaving the hospital. She is usually active, and out of bed; but she has been feeling more sleepy and staying in bed a lot longer than usual. She also reports increased bowel movements. She says that for the past few days, every time she eats she feels the need to have a bowel movement. Her bowel movements have been normal in appearance and consistency, just increased in frequency. She has had the sensation that if she were to have a bowel movement, the pain would get better; but this was not the case: the pain persisted despite bowel movements. She reports some chills but denies fevers. She denies hematochezia, melena, constipation, nausea, vomiting, anorexia, and decreased oral intake.  She does report, on review of systems, dysuria that has been persistent since her last hospital stay despite antibiotics, and occasional tickle in her throat and cough that is nonproductive, dyspnea at baseline that is no worse, and occasional palpitations described  as a racing heartbeat. She denies headaches, dizziness, cold symptoms, and chest pain.     Review of Systems:   Constitutional: Positive for chills and malaise/fatigue. Negative for fever and diaphoresis.  HENT: Negative.  Negative for ear pain, congestion and sore throat.   Eyes: Negative.   Respiratory: Positive for cough. Negative for sputum production and shortness of breath.   Cardiovascular: Positive for palpitations. Negative for chest pain.  Gastrointestinal: Positive for abdominal pain. Negative for nausea, vomiting, diarrhea, constipation, blood in stool and melena.  Genitourinary: Positive for dysuria. Negative for hematuria.  Musculoskeletal: Negative.  Negative for myalgias and joint pain.  Skin: Negative.  Negative for rash.  Neurological: Negative.  Negative for dizziness and headaches.     Medical History: Past Medical History  Diagnosis Date  . Hypertension   . Goiter     radioactive iodine ablation/notes 01/02/2012  . COPD (chronic obstructive pulmonary disease)   . CHF (congestive heart failure)           questionable, 2011 echo showed normal diastolic and systolic function    . Pneumonia ?01/02/2012; ? 2009  . SOB (shortness of breath)     'all the time" (01/02/2012)  . Hypothyroidism     "I have taken Synthroid before" (01/02/2012)  . Arthritis     "right shoulder" (01/02/2012)    Surgical History: Past Surgical History  Procedure Date  . Cataract extraction w/ intraocular lens  implant, bilateral ?1990's  . Appendectomy 1980's    "when they did hysterectomy" (01/02/2012)  . Abdominal hysterectomy 1980's    Home Medications: 1.  Albuterol 2 puffs every 6 hours as needed for wheezing 2. Aspirin 81 mg daily 3. Ibuprofen 200 mg every 4 hours as needed for pain 4. Lisinopril 10 mg daily 5. Vitamin D 50,000 units every 14 days 6. Albuterol-ipratropium 2 puffs every 6 hours as needed for dyspnea   Allergies: Allergies as of 01/14/2012 - Review Complete  01/14/2012  Allergen Reaction Noted  . Indomethacin Anaphylaxis and Other (See Comments) 01/29/2011    Family History: Family History  Problem Relation Age of Onset  . Heart disease      Social History: Social History  . Marital Status: Widowed    Spouse Name: N/A    Number of Children: 6  . Years of Education: N/A   Occupational History  . retired    Social History Main Topics  . Smoking status: Former Smoker -- 0.1 packs/day for 35 years    Types: Cigarettes    Quit date: 01/02/1991  . Smokeless tobacco: Never Used  . Alcohol Use: No  . Drug Use: No  . Sexually Active: No    Physical exam: Filed Vitals:   01/14/12 2125  BP: 146/76  Pulse: 70  Temp: 97.6 F (36.4 C)  Resp: 16  SpO2  96% on room air    GENERAL: Overweight; no acute distress HEAD: atraumatic, normocephalic EYES: pupils equal, round and reactive; sclera anicteric; normal conjunctiva EARS: canals patent and TMs normal bilaterally NOSE/THROAT: oropharynx clear, moist mucous membranes, pink gums, dentures in place NECK: supple, no carotid bruits, palpable goiter without nodules LYMPH: no cervical or supraclavicular lymphadenopathy LUNGS: Bibasilar rales, no wheezing, normal work of breathing HEART: normal rate and regular rhythm; normal S1 and S2 without S3 or S4; no murmurs, rubs, or clicks PULSES: radial and pedal  pulses 2+ and symmetric ABDOMEN: soft; generalized tenderness with palpation, worse in the periumbilical region; no rigidity, guarding, or rebound; no masses organomegaly; normal bowel sounds CRANIAL NERVES: pupils reactive to light bilaterally; extra occular muscles are intact; facial sensation is intact and equal bilaterally in V1, V2, and V3; smile is symmetric; uvula is midline and palate elevates symmetrically; tongue protrudes midline. SKIN: warm, dry, intact, normal turgor, no rashes EXTREMITIES: no peripheral edema, clubbing, or cyanosis PSYCH: patient is alert and oriented, mood  and affect are normal and congruent, thought content is normal without delusions, thought process is linear, speech is normal and non-pressured, behavior is normal, judgement and insight are normal       CMP     Component Value Date/Time   NA 133* 01/14/2012 1856   K 4.4 01/14/2012 1856   CL 98 01/14/2012 1856   CO2 23 01/14/2012 1856   GLUCOSE 100* 01/14/2012 1856   BUN 12 01/14/2012 1856   CREATININE 0.75 01/14/2012 1856   CALCIUM 9.2 01/14/2012 1856   PROT 7.2 01/14/2012 1856   ALBUMIN 3.1* 01/14/2012 1856   AST 19 01/14/2012 1856   ALT 11 01/14/2012 1856   ALKPHOS 60 01/14/2012 1856   BILITOT 1.0 01/14/2012 1856   GFRNONAA 76* 01/14/2012 1856   GFRAA 88* 01/14/2012 1856    CBC    Component Value Date/Time   WBC 12.7* 01/14/2012 1856   WBC 12.5* 01/14/2012 1246   RBC 3.97 01/14/2012 1856   RBC 4.03* 01/14/2012 1246   HGB 12.2 01/14/2012 1856   HGB 11.5* 01/14/2012 1246   HCT 36.0 01/14/2012 1856   HCT 37.8 01/14/2012 1246   PLT 334 01/14/2012 1856   MCV 90.7 01/14/2012 1856  MCV 93.9 01/14/2012 1246   MCH 30.7 01/14/2012 1856   MCH 28.5 01/14/2012 1246   MCHC 33.9 01/14/2012 1856   MCHC 30.4* 01/14/2012 1246   RDW 12.7 01/14/2012 1856   LYMPHSABS 1.1 01/14/2012 1856   MONOABS 1.1* 01/14/2012 1856   EOSABS 0.0 01/14/2012 1856   BASOSABS 0.0 01/14/2012 1856    Urinalysis    Component Value Date/Time   COLORURINE ORANGE* 01/02/2012 1536   APPEARANCEUR CLOUDY* 01/02/2012 1536   LABSPEC 1.027 01/02/2012 1536   PHURINE 5.0 01/02/2012 1536   GLUCOSEU NEGATIVE 01/02/2012 1536   HGBUR MODERATE* 01/02/2012 1536   BILIRUBINUR neg 01/14/2012 1246   BILIRUBINUR SMALL* 01/02/2012 1536   KETONESUR 15* 01/02/2012 1536   PROTEINUR 100* 01/02/2012 1536   UROBILINOGEN 0.2 01/14/2012 1246   UROBILINOGEN 1.0 01/02/2012 1536   NITRITE neg 01/14/2012 1246   NITRITE POSITIVE* 01/02/2012 1536   LEUKOCYTESUR Negative 01/14/2012 1246    Urine Microscopy:  small mucous, 2-4 epithelial cells, 3-6 WBCs, small bacteria, 10-15 RBCs, no  yeast, no casts, no crystals   Imaging results: Dg Chest 2 View 01/14/2012  FINDINGS: Improved right lower lobe airspace opacity compatible with resolving pneumonia.  Mild post infectious/inflammatory changes.  Cardiopericardial silhouette appears within normal limits.  Left lung clear. Right humeral head AVN incidentally noted.   IMPRESSION: Improved right lower lobe pneumonia with post infectious/inflammatory changes.  Dg Abd 1 View 01/14/2012  FINDINGS: Nonobstructive bowel gas pattern.  No plain film evidence of free air.  Thoracic and lumbar spondylosis is present.  The pelvis is not visualized on this single view abdomen radiograph.   IMPRESSION: No acute abnormality.   Ct Abdomen Pelvis W Contrast 01/14/2012 FINDINGS: Inflammatory changes of the sigmoid colon are present with stranding in the adjacent fat.  Diverticulosis is present in this area. There is however prominent gas within the region of the wall of the sigmoid colon worrisome for pneumatosis.  There is a 2.4 x 2.3 cm abscess containing fluid and gas adjacent to the sigmoid colon.  Other smaller abscesses containing gas bubbles are also noted.  There is a small amount of free fluid layering in the pelvis.  There is a 1.3 x 4.4 cm irregular peripheral pleural based parenchymal opacity in the peripheral basal segment of the right lower lobe.  Liver, gallbladder, spleen, pancreas are within normal limits. Several hypodensities are scattered throughout both kidneys and are difficult to characterize.  The larger ones are compatible with simple cyst.  1.6 x 2.0 cm nonspecific left adrenal nodule.  Mild prominence of the right adrenal gland.  Atherosclerotic changes of the visceral vasculature, aorta, and iliac vessels are present.  Advanced degenerative changes in the lumbar spine.  Umbilical hernia contains only adipose tissue. IMPRESSION: There is an inflammatory process of the sigmoid colon with adjacent abscess compatible with  perforation.  Findings suggest pneumatosis of the colon.  Differential diagnosis includes advanced inflammatory process such as diverticulitis or ischemia.  Nonspecific left adrenal nodule. Follow-up MRI or CT in 12 months is recommended.  If there is a history of cancer, MRI is recommended at this time.  The  Multiple renal hypodensities which are nonspecific.  MRI can be performed to further characterize.    Assessment and Plan:  1.   Diverticulitis with perforation, abscess formation, and colonic pneumatosis:  This patient does not have a history of diverticulitis; but her history, exam findings, and radiographic findings are consistent with diverticulitis. There is evidence of pneumatosis in the adjacent colonic  wall and a pericolonic abscess (2.4 x 2.3 cm). The emergency department ordered ertapenem and consulted surgery for [place]. We spoke with the surgeon in the ED, and she agreed with the use of ertapenem for now. Possible treatment approaches include surgery, percutaneous drainage, and conservative medical management. We will await surgery's recommendation on this.  - N.p.o.  - Ertapenem per pharmacy - Surgery recommendations  - Normal saline 75 mL/hr - CBC in AM - Morphine 2 mg every 2 hours as needed for pain  2.   Microscopic hematuria:  Urinalysis demonstrates moderate hemoglobin, positive nitrites, 3-6 WBCs, and 10-15 RBCs. Her symptoms of dysuria are concerning for cystitis, but this will be covered with antibiotics used to treat her diverticulitis. These findings can also be explained by the appositional pericolonic inflammation. She will need a followup urinalysis in the outpatient setting, following resolution of the intra-abdominal inflammation. - Treat diverticulitis as above   3.   Community acquired pneumonia:  Symptoms have resolved. Course of antibiotics completed. Radiographic signs of infection in the right lung are improved.   4.   COPD:  Stable. At home she uses albuterol  and albuterol/ipratropium as needed for wheezing or shortness of breath. We'll continue these here.  - Albuterol and albuterol/ipratropium every 6 hours as needed   5.   Hypertension:  At home, she takes lisinopril 10 mg daily. Over the past couple months, her blood pressures have ranged from 160-100/80-60. On average it seems to be running 135/75. While she is n.p.o., pending surgeries evaluation, we will hold her oral lisinopril. - Hold lisinopril while n.p.o.   6.   Hyponatremia:  Sodium on admission was 133. Other electrolytes were normal. We will continue to monitor this and hydrate with normal saline at 75 mL/hr. - BMET in AM  7.   Incidental pulmonary and adrenal nodules:  1.3 x 4.4 cm irregular peripheral pleural based parenchymal opacity in the right lower lobe on computed tomography. There is also a 1. 6 x 2.0 cm left adrenal nodule. The radiologist recommends followup imaging in 12 months for the adrenal nodule. According to the Fleischner Society recommendations, this pulmonary nodule will require imaging followup with a contrasted CT in 3 months. We will defer these things to the outpatient setting.   8.   Prophylaxis:  We will hold off on pharmacologic VTE until surgery gives [place] the recommendations. We will use SCDs for now.  9.   Disposition:   Patient's PCP is Dr. Windle Guard  (unassigned), and she will not require OPC followup. Expected length of stay is greater than 2 days.     Signed by:  Dorthula Rue. Earlene Plater, MD PGY-I, Internal Medicine  01/14/2012, 11:25 PM

## 2012-01-14 NOTE — ED Notes (Signed)
Pt was sent here for hole in colon per CT scan done today.  Pt was told she has diverticulitis. Pt states abdominal pain all over and states pain worsented last nite

## 2012-01-14 NOTE — Patient Instructions (Addendum)
Please go over to [name] to have your CT scan.  They will do your scan and then call me on the phone.  If there is a problem that needs treatment I will call in medication for you.

## 2012-01-14 NOTE — ED Provider Notes (Signed)
History     CSN: [id number]  Arrival date & time 01/14/12  1655   First MD Initiated Contact with Patient 01/14/12 2114      Chief Complaint  Patient presents with  . Hole in colon Per CT     (Consider location/radiation/quality/duration/timing/severity/associated sxs/prior treatment) HPI Comments: Patient comes to the ER for evaluation of abdominal pain. Patient reports that she has been experiencing abdominal pain for about a week. Her last couple of days, however, the pain has become much more severe. Patient reports sharp stabbing pains across her lower abdomen. She had a CAT scan done earlier today and it showed what appears to be diverticulitis with abscess formation and pneumatosis. Patient does report that her pain is not as severe now as it has been over the last 2 days. She has not had any fever. She has had mild diarrhea without any blood in it. No nausea or vomiting.   Past Medical History  Diagnosis Date  . Hypertension   . Goiter     radioactive iodine ablation/notes 01/02/2012  . Chest pain   . COPD (chronic obstructive pulmonary disease)   . CHF (congestive heart failure)   . Pneumonia ?01/02/2012; ? 2009  . SOB (shortness of breath)     'all the time" (01/02/2012)  . Hypothyroidism     "I have taken Synthroid before" (01/02/2012)  . Arthritis     "right shoulder" (01/02/2012)    Past Surgical History  Procedure Date  . Cataract extraction w/ intraocular lens  implant, bilateral ?1990's  . Appendectomy 1980's    "when they did hysterectomy" (01/02/2012)  . Abdominal hysterectomy 1980's    Family History  Problem Relation Age of Onset  . Heart disease      History  Substance Use Topics  . Smoking status: Former Smoker -- 0.1 packs/day for 35 years    Types: Cigarettes    Quit date: 01/02/1991  . Smokeless tobacco: Never Used  . Alcohol Use: No    OB History    Grav Para Term Preterm Abortions TAB SAB Ect Mult Living                  Review of Systems    Constitutional: Negative for fever.  Gastrointestinal: Positive for abdominal pain and diarrhea. Negative for blood in stool.  All other systems reviewed and are negative.    Allergies  Indomethacin  Home Medications   Current Outpatient Rx  Name  Route  Sig  Dispense  Refill  . ALBUTEROL SULFATE HFA 108 (90 BASE) MCG/ACT IN AERS   Inhalation   Inhale 2 puffs into the lungs every 6 (six) hours as needed for wheezing.   1 Inhaler   2   . IPRATROPIUM-ALBUTEROL 18-103 MCG/ACT IN AERO   Inhalation   Inhale 2 puffs into the lungs every 6 (six) hours as needed. For shortness of breath         . ASPIRIN 81 MG PO TABS   Oral   Take 81 mg by mouth daily.         . IBUPROFEN 200 MG PO TABS   Oral   Take 200 mg by mouth every 4 (four) hours as needed. For pain         . LISINOPRIL 10 MG PO TABS   Oral   Take 10 mg by mouth.         Marland Kitchen VITAMIN D (ERGOCALCIFEROL) 50000 UNITS PO CAPS   Oral   Take  50,000 Units by mouth every 14 (fourteen) days.           BP 160/86  Pulse 72  Temp 98.3 F (36.8 C) (Oral)  Resp 16  SpO2 100%  Physical Exam  Constitutional: She is oriented to person, place, and time. She appears well-developed and well-nourished. No distress.  HENT:  Head: Normocephalic and atraumatic.  Right Ear: Hearing normal.  Nose: Nose normal.  Mouth/Throat: Oropharynx is clear and moist and mucous membranes are normal.  Eyes: Conjunctivae normal and EOM are normal. Pupils are equal, round, and reactive to light.  Neck: Normal range of motion. Neck supple.  Cardiovascular: Normal rate, regular rhythm, S1 normal and S2 normal.  Exam reveals no gallop and no friction rub.   No murmur heard. Pulmonary/Chest: Effort normal and breath sounds normal. No respiratory distress. She exhibits no tenderness.  Abdominal: Soft. Normal appearance and bowel sounds are normal. She exhibits no distension. There is no hepatosplenomegaly. There is tenderness in the right lower  quadrant, suprapubic area and left lower quadrant. There is guarding. There is no rigidity, no rebound, no tenderness at McBurney's point and negative Murphy's sign. No hernia.  Musculoskeletal: Normal range of motion.  Neurological: She is alert and oriented to person, place, and time. She has normal strength. No cranial nerve deficit or sensory deficit. Coordination normal. GCS eye subscore is 4. GCS verbal subscore is 5. GCS motor subscore is 6.  Skin: Skin is warm, dry and intact. No rash noted. No cyanosis.  Psychiatric: She has a normal mood and affect. Her speech is normal and behavior is normal. Thought content normal.    ED Course  Procedures (including critical care time)  Labs Reviewed  CBC WITH DIFFERENTIAL - Abnormal; Notable for the following:    WBC 12.7 (*)     Neutrophils Relative 83 (*)     Neutro Abs 10.5 (*)     Lymphocytes Relative 8 (*)     Monocytes Absolute 1.1 (*)     All other components within normal limits  COMPREHENSIVE METABOLIC PANEL - Abnormal; Notable for the following:    Sodium 133 (*)     Glucose, Bld 100 (*)     Albumin 3.1 (*)     GFR calc non Af Amer 76 (*)     GFR calc Af Amer 88 (*)     All other components within normal limits   Dg Chest 2 View  01/14/2012  *RADIOLOGY REPORT*  Clinical Data: Pneumonia.  Shortness of breath.  Congestion.  CHEST - 2 VIEW  Comparison: 01/02/2012.  Findings: Improved right lower lobe airspace opacity compatible with resolving pneumonia.  Mild post infectious/inflammatory changes.  Cardiopericardial silhouette appears within normal limits.  Left lung clear. Right humeral head AVN incidentally noted.  IMPRESSION: Improved right lower lobe pneumonia with post infectious/inflammatory changes.   Original Report Authenticated By: Andreas Newport, M.D.    Dg Abd 1 View  01/14/2012  *RADIOLOGY REPORT*  Clinical Data: Abdominal pain.  ABDOMEN - 1 VIEW  Comparison: None.  Findings: Nonobstructive bowel gas pattern.  No plain  film evidence of free air.  Thoracic and lumbar spondylosis is present.  The pelvis is not visualized on this single view abdomen radiograph.  IMPRESSION: No acute abnormality.   Original Report Authenticated By: Andreas Newport, M.D.    Ct Abdomen Pelvis W Contrast  01/14/2012  *RADIOLOGY REPORT*  Clinical Data: Abdominal pain  CT ABDOMEN AND PELVIS WITH CONTRAST  Technique:  Multidetector CT imaging  of the abdomen and pelvis was performed following the standard protocol during bolus administration of intravenous contrast.  Contrast: OMNIPAQUE IOHEXOL 300 MG/ML  SOLN  Comparison: None.  Findings: Inflammatory changes of the sigmoid colon are present with stranding in the adjacent fat.  Diverticulosis is present in this area. There is however prominent gas within the region of the wall of the sigmoid colon worrisome for pneumatosis.  There is a 2.4 x 2.3 cm abscess containing fluid and gas adjacent to the sigmoid colon.  Other smaller abscesses containing gas bubbles are also noted.  There is a small amount of free fluid layering in the pelvis.  There is a 1.3 x 4.4 cm irregular peripheral pleural based parenchymal opacity in the peripheral basal segment of the right lower lobe.  Liver, gallbladder, spleen, pancreas are within normal limits. Several hypodensities are scattered throughout both kidneys and are difficult to characterize.  The larger ones are compatible with simple cyst.  1.6 x 2.0 cm nonspecific left adrenal nodule.  Mild prominence of the right adrenal gland.  Atherosclerotic changes of the visceral vasculature, aorta, and iliac vessels are present.  Advanced degenerative changes in the lumbar spine.  Umbilical hernia contains only adipose tissue.  IMPRESSION: There is an inflammatory process of the sigmoid colon with adjacent abscess compatible with perforation.  Findings suggest pneumatosis of the colon.  Differential diagnosis includes advanced inflammatory process such as diverticulitis or  ischemia.  Nonspecific left adrenal nodule. Follow-up MRI or CT in 12 months is recommended.  If there is a history of cancer, MRI is recommended at this time.  The  Multiple renal hypodensities which are nonspecific.  MRI can be performed to further characterize.  Critical Value/emergent results were called by telephone at the time of interpretation on 01/14/2012 at 1630 hours to Dr. Dallas Schimke, who verbally acknowledged these results.   Original Report Authenticated By: Jolaine Click, M.D.      Diagnosis: Diverticulitis with microperforation and abscess     MDM  Patient comes to the ER for evaluation of abdominal pain. She has undergone outpatient workup including CAT scan which showed evidence of diverticulosis with pneumatosis and small abscess formation adjacent to the sigmoid colon. This is most consistent with diverticulitis. His lab work and CT scan were reviewed. I did discuss the case with Dr. Donell Beers, on-call for general surgery. She'll see the patient in the ER now, but asked that the patient be evaluated by internal medicine for admission because of her multiple medical problems.        Gilda Crease, MD 01/14/12 2256

## 2012-01-14 NOTE — Progress Notes (Signed)
[facility] [address] [place] [address] [phone]  Date:  01/14/2012   Name:  [name]   DOB:  05/28/1925   MRN:  [id number]  PCP:  Kaleen Mask, MD    Chief Complaint: Abdominal Pain   History of Present Illness:  [name] is a 79 y.o. very pleasant female patient who presents with the following:  She was admitted overnight on 01/02/12 with pneumonia.  Sent home with levaquin which she has since finished.  Follow- up instructions asked for a recheck CBC and gyn evaluation for uterine prolapse, as well as a repeat CXR and possibly an evaluation for asymptomatic PVCs.    She has a history of COPD and CHF.  She feels that her COPD symptoms are at baseline.   She is here to see [place] today due to "soreness in my stomach that kept me up all night."  She noted the onset of this discomfort in the early evening yesterday.  She also notes that she has to urinate "every 10 minutes."  She also had pain with urination.   She has not had any diarrhea.  She is also not constipated- having normal stools.   Her cough and breathing are better.  She is SOB at baseline but states that she is breathing as well as usual.   No nausea or vomiting.   She is still able to eat.    Urine culture from 01/02/12 did not grow any bacteria.    Her son later came back to the room- according to his report this abdominal pain has actually been going on for a couple of weeks.    Patient Active Problem List  Diagnosis  . COPD (chronic obstructive pulmonary disease)  . CAP (community acquired pneumonia)  . Hypertension  . CHF (congestive heart failure)  . Hx of goiter  . UTI (lower urinary tract infection)  . Asymptomatic PVCs  . H/O: hypothyroidism  . Leukocytosis    Past Medical History  Diagnosis Date  . Hypertension   . Goiter     radioactive iodine ablation/notes 01/02/2012  . Chest pain   . COPD (chronic obstructive pulmonary disease)   . CHF (congestive  heart failure)   . Pneumonia ?01/02/2012; ? 2009  . SOB (shortness of breath)     'all the time" (01/02/2012)  . Hypothyroidism     "I have taken Synthroid before" (01/02/2012)  . Arthritis     "right shoulder" (01/02/2012)    Past Surgical History  Procedure Date  . Cataract extraction w/ intraocular lens  implant, bilateral ?1990's  . Appendectomy 1980's    "when they did hysterectomy" (01/02/2012)  . Abdominal hysterectomy 1980's    History  Substance Use Topics  . Smoking status: Former Smoker -- 0.1 packs/day for 35 years    Types: Cigarettes    Quit date: 01/02/1991  . Smokeless tobacco: Never Used  . Alcohol Use: No    Family History  Problem Relation Age of Onset  . Heart disease      Allergies  Allergen Reactions  . Indomethacin Anaphylaxis and Other (See Comments)    "took it fine for awhile; one day I stopped breathing and I ended up in the hospital" (01/02/2012)    Medication list has been reviewed and updated.  Current Outpatient Prescriptions on File Prior to Visit  Medication Sig Dispense Refill  . albuterol (PROVENTIL HFA;VENTOLIN HFA) 108 (90 BASE) MCG/ACT inhaler Inhale 2 puffs into  the lungs every 6 (six) hours as needed for wheezing.  1 Inhaler  2  . aspirin 81 MG tablet Take 81 mg by mouth daily.      Marland Kitchen ibuprofen (MOTRIN IB) 200 MG tablet Take 200 mg by mouth every 4 (four) hours as needed. For pain      . lisinopril (PRINIVIL,ZESTRIL) 10 MG tablet Take 10 mg by mouth.      . Vitamin D, Ergocalciferol, (DRISDOL) 50000 UNITS CAPS Take 50,000 Units by mouth every 14 (fourteen) days.      Marland Kitchen albuterol-ipratropium (COMBIVENT) 18-103 MCG/ACT inhaler Inhale 2 puffs into the lungs every 6 (six) hours as needed. For shortness of breath      . levofloxacin (LEVAQUIN) 500 MG tablet Take 1 tablet (500 mg total) by mouth daily with supper.  6 tablet  0  . predniSONE (DELTASONE) 20 MG tablet Take 2 tablets (40 mg total) by mouth daily with breakfast.  3 tablet  0     Review of Systems:  As per HPI- otherwise negative.   Physical Examination: Filed Vitals:   01/14/12 1141  BP: 160/80  Pulse: 77  Temp: 98.7 F (37.1 C)  Resp: 16   Filed Vitals:   01/14/12 1141  Height: 5' 1.5" (1.562 m)  Weight: 156 lb (70.761 kg)   Body mass index is 29.00 kg/(m^2). Ideal Body Weight: Weight in (lb) to have BMI = 25: 134.2   GEN: WDWN, NAD, Non-toxic, A & O x 3, overweight HEENT: Atraumatic, Normocephalic. Neck supple. No masses, No LAD., Bilateral TM wnl, oropharynx normal.  PEERL,EOMI.   Ears and Nose: No external deformity. CV: RRR, No M/G/R. No JVD. No thrill. No extra heart sounds. PULM: CTA B, no wheezes, crackles, rhonchi. No retractions. No resp. distress. No accessory muscle use. ABD: S, ND, +BS. No rebound. No HSM. Vague tenderness which is most pronounced in the LUQ and RUQ.   Normal external genital exam- no gross prolapse visible.   EXTR: No c/c/e NEURO Normal gait.  PSYCH: Normally interactive. Conversant. Not depressed or anxious appearing.  Calm demeanor.   Results for orders placed in visit on 01/14/12  POCT CBC      Component Value Range   WBC 12.5 (*) 4.6 - 10.2 K/uL   Lymph, poc 1.5  0.6 - 3.4   POC LYMPH PERCENT 12.1  10 - 50 %L   MID (cbc) 0.8  0 - 0.9   POC MID % 6.2  0 - 12 %M   POC Granulocyte 10.2 (*) 2 - 6.9   Granulocyte percent 81.7 (*) 37 - 80 %G   RBC 4.03 (*) 4.04 - 5.48 M/uL   Hemoglobin 11.5 (*) 12.2 - 16.2 g/dL   HCT, POC 16.1  09.6 - 47.9 %   MCV 93.9  80 - 97 fL   MCH, POC 28.5  27 - 31.2 pg   MCHC 30.4 (*) 31.8 - 35.4 g/dL   RDW, POC 04.5     Platelet Count, POC 341  142 - 424 K/uL   MPV 8.5  0 - 99.8 fL  POCT UA - MICROSCOPIC ONLY      Component Value Range   WBC, Ur, HPF, POC 3-6     RBC, urine, microscopic 10-15     Bacteria, U Microscopic small     Mucus, UA small     Epithelial cells, urine per micros 2-4     Crystals, Ur, HPF, POC neg     Casts, Ur,  LPF, POC neg     Yeast, UA neg     POCT URINALYSIS DIPSTICK      Component Value Range   Color, UA amber     Clarity, UA hazy     Glucose, UA neg     Bilirubin, UA neg     Ketones, UA neg     Spec Grav, UA 1.025     Blood, UA moderate     pH, UA 5.5     Protein, UA 100     Urobilinogen, UA 0.2     Nitrite, UA neg     Leukocytes, UA Negative     [facility] reading (PRIMARY) by  Dr. Patsy Lager. CXR: improved RRL pneumonia Upright abdomen: no air fluid levels or free air  ABDOMEN - 1 VIEW  Comparison: None.  Findings: Nonobstructive bowel gas pattern. No plain film evidence of free air. Thoracic and lumbar spondylosis is present. The pelvis is not visualized on this single view abdomen radiograph.  IMPRESSION: No acute abnormality  Clinical Data: Pneumonia. Shortness of breath. Congestion.  CHEST - 2 VIEW  Comparison: 01/02/2012.  Findings: Improved right lower lobe airspace opacity compatible with resolving pneumonia. Mild post infectious/inflammatory changes. Cardiopericardial silhouette appears within normal limits. Left lung clear. Right humeral head AVN incidentally noted.  IMPRESSION: Improved right lower lobe pneumonia with post infectious/inflammatory changes.   Assessment and Plan: 1. Abdominal  pain, other specified site  POCT CBC, POCT UA - Microscopic Only, POCT urinalysis dipstick, Urine culture, DG Abd 1 View, CT Abdomen Pelvis W Contrast, Comprehensive metabolic panel  2. Pneumonia  DG Chest 2 View   [name] is here today with abdominal pain- uncertain etiology.  The pain kept her awake all night and she has persistent leukocytosis so will do a CT scan today.  CMP and urine culture pending as above.   Pneumonia seems improved on today's x-ray  Jacqueline Winborne, MD  Received call report regarding CT:   CT ABDOMEN AND PELVIS WITH CONTRAST  Technique: Multidetector CT imaging of the abdomen and pelvis was performed following the standard protocol during bolus administration of intravenous  contrast.  Contrast: OMNIPAQUE IOHEXOL 300 MG/ML SOLN  Comparison: None.  Findings: Inflammatory changes of the sigmoid colon are present with stranding in the adjacent fat. Diverticulosis is present in this area. There is however prominent gas within the region of the wall of the sigmoid colon worrisome for pneumatosis. There is a 2.4 x 2.3 cm abscess containing fluid and gas adjacent to the sigmoid colon. Other smaller abscesses containing gas bubbles are also noted. There is a small amount of free fluid layering in the pelvis.  There is a 1.3 x 4.4 cm irregular peripheral pleural based parenchymal opacity in the peripheral basal segment of the right lower lobe.  Liver, gallbladder, spleen, pancreas are within normal limits. Several hypodensities are scattered throughout both kidneys and are difficult to characterize. The larger ones are compatible with simple cyst.  1.6 x 2.0 cm nonspecific left adrenal nodule. Mild prominence of the right adrenal gland.  Atherosclerotic changes of the visceral vasculature, aorta, and iliac vessels are present.  Advanced degenerative changes in the lumbar spine.  Umbilical hernia contains only adipose tissue.  IMPRESSION: There is an inflammatory process of the sigmoid colon with adjacent abscess compatible with perforation. Findings suggest pneumatosis of the colon. Differential diagnosis includes advanced inflammatory process such as diverticulitis or ischemia.  Nonspecific left adrenal nodule. Follow-up MRI or CT in 12 months is recommended. If  there is a history of cancer, MRI is recommended at this time. The  Multiple renal hypodensities which are nonspecific. MRI can be performed to further characterize.  Critical Value/emergent results were called by telephone at the time of interpretation on 01/14/2012 at 1630 hours to Dr. Dallas Schimke, who verbally acknowledged these results.  Called and spoke with [name] and her son  who is with her at GI.  They will proceed to the [facility] ER for further evaluation and likely admission.  Also alerted EDP at [facility] and the charge nurse.

## 2012-01-14 NOTE — ED Notes (Signed)
Spoke with [name], pt has perforated diverticulitis

## 2012-01-15 ENCOUNTER — Encounter: Payer: Self-pay | Admitting: Family Medicine

## 2012-01-15 DIAGNOSIS — J189 Pneumonia, unspecified organism: Secondary | ICD-10-CM

## 2012-01-15 DIAGNOSIS — I10 Essential (primary) hypertension: Secondary | ICD-10-CM

## 2012-01-15 DIAGNOSIS — J449 Chronic obstructive pulmonary disease, unspecified: Secondary | ICD-10-CM

## 2012-01-15 DIAGNOSIS — R3129 Other microscopic hematuria: Secondary | ICD-10-CM

## 2012-01-15 DIAGNOSIS — K5732 Diverticulitis of large intestine without perforation or abscess without bleeding: Principal | ICD-10-CM

## 2012-01-15 LAB — BASIC METABOLIC PANEL
Calcium: 8.1 mg/dL — ABNORMAL LOW (ref 8.4–10.5)
GFR calc non Af Amer: 76 mL/min — ABNORMAL LOW (ref >90)
Sodium: 136 mEq/L (ref 135–145)

## 2012-01-15 LAB — COMPREHENSIVE METABOLIC PANEL
Alkaline Phosphatase: 61 U/L (ref 39–117)
Glucose, Bld: 90 mg/dL (ref 70–99)
Sodium: 139 mEq/L (ref 135–145)
Total Bilirubin: 1.4 mg/dL — ABNORMAL HIGH (ref 0.3–1.2)
Total Protein: 6.8 g/dL (ref 6.0–8.3)

## 2012-01-15 LAB — CBC
Platelets: 277 10*3/uL (ref 150–400)
RBC: 3.27 MIL/uL — ABNORMAL LOW (ref 3.87–5.11)
WBC: 10.4 10*3/uL (ref 4.0–10.5)

## 2012-01-15 MED ORDER — CIPROFLOXACIN IN D5W 400 MG/200ML IV SOLN
400.0000 mg | Freq: Two times a day (BID) | INTRAVENOUS | Status: DC
  Administered 2012-01-15 – 2012-01-17 (×6): 400 mg via INTRAVENOUS
  Filled 2012-01-15 (×7): qty 200

## 2012-01-15 MED ORDER — METRONIDAZOLE IN NACL 5-0.79 MG/ML-% IV SOLN
500.0000 mg | Freq: Three times a day (TID) | INTRAVENOUS | Status: DC
  Administered 2012-01-15 – 2012-01-17 (×8): 500 mg via INTRAVENOUS
  Filled 2012-01-15 (×10): qty 100

## 2012-01-15 MED ORDER — DEXTROSE-NACL 5-0.45 % IV SOLN
INTRAVENOUS | Status: DC
  Administered 2012-01-15 – 2012-01-16 (×2): via INTRAVENOUS
  Administered 2012-01-17: 125 mL/h via INTRAVENOUS
  Administered 2012-01-18: 04:00:00 via INTRAVENOUS
  Administered 2012-01-18: 125 mL/h via INTRAVENOUS
  Administered 2012-01-19 (×2): via INTRAVENOUS
  Administered 2012-01-19: 125 mL/h via INTRAVENOUS

## 2012-01-15 NOTE — Progress Notes (Signed)
If she worsens then she would need surgery. Will follow closely

## 2012-01-15 NOTE — Consult Note (Signed)
Reason for Consult:colitis Referring Physician: Dr. Dewain Penning NYILAH KIGHT is an 79 y.o. female.  HPI:  Patient is 79 year old female who presents with approximately one week of abdominal pain. This got worse over the last 2 days. She has noted that she has not had much of an appetite during this time. She denies nausea or vomiting. She denies a change in her bowel habits. She is continued to pass gas. She has never had a colonoscopy and has never been diagnosed with diverticulitis. She has no history of vascular disease or atrial fibrillation. She denies fevers and chills. She does state the pain was 10/10 when she arrived at the emergency department and has gotten a little better since being here and was now 7/10.  She has no family history of colon cancer.    Past Medical History  Diagnosis Date  . Hypertension   . Goiter     radioactive iodine ablation/notes 01/02/2012  . COPD (chronic obstructive pulmonary disease)   . CHF (congestive heart failure)   . Pneumonia ?01/02/2012; ? 2009  . SOB (shortness of breath)     'all the time" (01/02/2012)  . Hypothyroidism     "I have taken Synthroid before" (01/02/2012)  . Arthritis     "right shoulder" (01/02/2012)    Past Surgical History  Procedure Date  . Cataract extraction w/ intraocular lens  implant, bilateral ?1990's  . Appendectomy 1980's    "when they did hysterectomy" (01/02/2012)  . Abdominal hysterectomy 1980's    Family History  Problem Relation Age of Onset  . Heart disease      Social History:  reports that she quit smoking about 21 years ago. Her smoking use included Cigarettes. She has a 4.2 pack-year smoking history. She has never used smokeless tobacco. She reports that she does not drink alcohol or use illicit drugs.  Allergies:  Allergies  Allergen Reactions  . Indomethacin Anaphylaxis and Other (See Comments)    "took it fine for awhile; one day I stopped breathing and I ended up in the hospital" (01/02/2012)     Medications:  Medications  Facility-Administered Medications Show prescriptions   0.9 % sodium chloride infusion  albuterol (PROVENTIL HFA;VENTOLIN HFA) 108 (90 BASE) MCG/ACT inhaler 2 puff  albuterol-ipratropium (COMBIVENT) inhaler 2 puff  ciprofloxacin (CIPRO) IVPB 400 mg  metroNIDAZOLE (FLAGYL) IVPB 500 mg  morphine 2 MG/ML injection 2 mg  ondansetron (ZOFRAN) injection 4 mg  ondansetron (ZOFRAN) tablet 4 mg     Results for orders placed during the hospital encounter of 01/14/12 (from the past 48 hour(s))  CBC WITH DIFFERENTIAL     Status: Abnormal   Collection Time   01/14/12  6:56 PM      Component Value Range Comment   WBC 12.7 (*) 4.0 - 10.5 K/uL    RBC 3.97  3.87 - 5.11 MIL/uL    Hemoglobin 12.2  12.0 - 15.0 g/dL    HCT 16.1  09.6 - 04.5 %    MCV 90.7  78.0 - 100.0 fL    MCH 30.7  26.0 - 34.0 pg    MCHC 33.9  30.0 - 36.0 g/dL    RDW 40.9  81.1 - 91.4 %    Platelets 334  150 - 400 K/uL    Neutrophils Relative 83 (*) 43 - 77 %    Neutro Abs 10.5 (*) 1.7 - 7.7 K/uL    Lymphocytes Relative 8 (*) 12 - 46 %    Lymphs Abs 1.1  0.7 - 4.0 K/uL    Monocytes Relative 9  3 - 12 %    Monocytes Absolute 1.1 (*) 0.1 - 1.0 K/uL    Eosinophils Relative 0  0 - 5 %    Eosinophils Absolute 0.0  0.0 - 0.7 K/uL    Basophils Relative 0  0 - 1 %    Basophils Absolute 0.0  0.0 - 0.1 K/uL   COMPREHENSIVE METABOLIC PANEL     Status: Abnormal   Collection Time   01/14/12  6:56 PM      Component Value Range Comment   Sodium 133 (*) 135 - 145 mEq/L    Potassium 4.4  3.5 - 5.1 mEq/L    Chloride 98  96 - 112 mEq/L    CO2 23  19 - 32 mEq/L    Glucose, Bld 100 (*) 70 - 99 mg/dL    BUN 12  6 - 23 mg/dL    Creatinine, Ser 1.61  0.50 - 1.10 mg/dL    Calcium 9.2  8.4 - 09.6 mg/dL    Total Protein 7.2  6.0 - 8.3 g/dL    Albumin 3.1 (*) 3.5 - 5.2 g/dL    AST 19  0 - 37 U/L    ALT 11  0 - 35 U/L    Alkaline Phosphatase 60  39 - 117 U/L    Total Bilirubin 1.0  0.3 - 1.2 mg/dL    GFR calc  non Af Amer 76 (*) >90 mL/min    GFR calc Af Amer 88 (*) >90 mL/min   PROTIME-INR     Status: Abnormal   Collection Time   01/14/12 11:32 PM      Component Value Range Comment   Prothrombin Time 16.6 (*) 11.6 - 15.2 seconds    INR 1.38  0.00 - 1.49   APTT     Status: Abnormal   Collection Time   01/14/12 11:32 PM      Component Value Range Comment   aPTT 42 (*) 24 - 37 seconds     Dg Chest 2 View  01/14/2012  *RADIOLOGY REPORT*  Clinical Data: Pneumonia.  Shortness of breath.  Congestion.  CHEST - 2 VIEW  Comparison: 01/02/2012.  Findings: Improved right lower lobe airspace opacity compatible with resolving pneumonia.  Mild post infectious/inflammatory changes.  Cardiopericardial silhouette appears within normal limits.  Left lung clear. Right humeral head AVN incidentally noted.  IMPRESSION: Improved right lower lobe pneumonia with post infectious/inflammatory changes.   Original Report Authenticated By: Andreas Newport, M.D.    Dg Abd 1 View  01/14/2012  *RADIOLOGY REPORT*  Clinical Data: Abdominal pain.  ABDOMEN - 1 VIEW  Comparison: None.  Findings: Nonobstructive bowel gas pattern.  No plain film evidence of free air.  Thoracic and lumbar spondylosis is present.  The pelvis is not visualized on this single view abdomen radiograph.  IMPRESSION: No acute abnormality.   Original Report Authenticated By: Andreas Newport, M.D.    Ct Abdomen Pelvis W Contrast  01/14/2012  *RADIOLOGY REPORT*  Clinical Data: Abdominal pain  CT ABDOMEN AND PELVIS WITH CONTRAST  Technique:  Multidetector CT imaging of the abdomen and pelvis was performed following the standard protocol during bolus administration of intravenous contrast.  Contrast: OMNIPAQUE IOHEXOL 300 MG/ML  SOLN  Comparison: None.  Findings: Inflammatory changes of the sigmoid colon are present with stranding in the adjacent fat.  Diverticulosis is present in this area. There is however prominent gas within the region of the  wall of the sigmoid  colon worrisome for pneumatosis.  There is a 2.4 x 2.3 cm abscess containing fluid and gas adjacent to the sigmoid colon.  Other smaller abscesses containing gas bubbles are also noted.  There is a small amount of free fluid layering in the pelvis.  There is a 1.3 x 4.4 cm irregular peripheral pleural based parenchymal opacity in the peripheral basal segment of the right lower lobe.  Liver, gallbladder, spleen, pancreas are within normal limits. Several hypodensities are scattered throughout both kidneys and are difficult to characterize.  The larger ones are compatible with simple cyst.  1.6 x 2.0 cm nonspecific left adrenal nodule.  Mild prominence of the right adrenal gland.  Atherosclerotic changes of the visceral vasculature, aorta, and iliac vessels are present.  Advanced degenerative changes in the lumbar spine.  Umbilical hernia contains only adipose tissue.  IMPRESSION: There is an inflammatory process of the sigmoid colon with adjacent abscess compatible with perforation.  Findings suggest pneumatosis of the colon.  Differential diagnosis includes advanced inflammatory process such as diverticulitis or ischemia.  Nonspecific left adrenal nodule. Follow-up MRI or CT in 12 months is recommended.  If there is a history of cancer, MRI is recommended at this time.  The  Multiple renal hypodensities which are nonspecific.  MRI can be performed to further characterize.  Critical Value/emergent results were called by telephone at the time of interpretation on 01/14/2012 at 1630 hours to Dr. Dallas Schimke, who verbally acknowledged these results.   Original Report Authenticated By: Jolaine Click, M.D.     Review of Systems  Constitutional: Negative.   HENT: Negative.   Eyes: Negative.   Respiratory: Positive for shortness of breath (with exertion.  minimally worse than baseline.).   Cardiovascular: Negative.   Gastrointestinal: Positive for abdominal pain. Negative for heartburn, nausea, vomiting, diarrhea,  constipation, blood in stool and melena.       Bloating  Genitourinary: Negative.   Musculoskeletal: Negative.   Skin: Negative.   Neurological: Negative.   Endo/Heme/Allergies: Negative.   Psychiatric/Behavioral: Negative.    Blood pressure 133/81, pulse 67, temperature 97.6 F (36.4 C), temperature source Oral, resp. rate 16, SpO2 96.00%. Physical Exam  Constitutional: She is oriented to person, place, and time. She appears well-developed and well-nourished. No distress.  HENT:  Head: Normocephalic and atraumatic.  Right Ear: External ear normal.  Left Ear: External ear normal.  Eyes: Conjunctivae normal and EOM are normal. Pupils are equal, round, and reactive to light. Right eye exhibits no discharge. Left eye exhibits no discharge. No scleral icterus.  Neck: Normal range of motion. Neck supple. No tracheal deviation present. No thyromegaly present.  Cardiovascular: Normal rate, regular rhythm and intact distal pulses.  Exam reveals no gallop and no friction rub.   No murmur heard. Respiratory: Effort normal and breath sounds normal. No respiratory distress. She has no wheezes. She has no rales. She exhibits no tenderness.  GI: Soft. She exhibits distension. There is tenderness (worse in RLQ). There is no rebound and no guarding.       Soft, sl bloated.  No guarding.  Negative rebound.  Focally tender in suprapubic and RLQ region   Musculoskeletal: She exhibits no edema and no tenderness.  Lymphadenopathy:    She has no cervical adenopathy.  Neurological: She is alert and oriented to person, place, and time. Coordination normal.  Skin: Skin is warm and dry. No rash noted. She is not diaphoretic. No erythema. No pallor.  Psychiatric: She has a  normal mood and affect. Her behavior is normal. Judgment and thought content normal.    Assessment/Plan: Diverticulitis with microperforation. Bowel rest. IVF IV antibiotics Low threshold for repeat CT Reviewed potential timelines for  patients.  Advised pt that she may become worse and require operation in next 24-48 hours.  She may smolder and require repeat CT and drain or OR in next 3-7 days.  Best case scenario would be to turn around quickly and tolerate diet.   Pt expresses understanding.  Reviewed that operation in immediate time frame would require ostomy. Surgery to follow.     Makaela Cando 01/15/2012, 12:11 AM

## 2012-01-15 NOTE — H&P (Signed)
Internal Medicine Teaching Service Attending Note Date: 01/15/2012  Patient name: [name]  Medical record number: [id number]  Date of birth: 21-Oct-1925   I have seen and evaluated [name] and discussed their care with the Residency Team.    [name] is an 79yo woman who presented to the [facility] ED for evaluation of abdominal pain.  [name] reports only about a week of periumbilical pain, however, her daughter who is in the room reports that she has been complaining of abdominal pain for a long time, and had some symptoms when previously admitted to the hospital earlier this month.  Most recently, her symptoms acutely worsened over the past week to the point that it was 10/10 in severity and described as "soreness."  She reports the pain to be band like around the umbilicus.  The pain does not seem to be worse with food.  She denies diarrhea, constipation.  She has had some nausea and reports one episode of non bilious, non bloody emesis.  She has had decreased PO intake.  Further associated symptoms include fatigue and increased sleepiness.  She does report an increased feeling of need to have a BM, but sometimes the inability too.  She is having normal formed bowel movements.  The pain does not improve with BM.  She denies chills, fevers, blood in the stool, melena.  She further reports some dysuria.   [name] also reports many months of intermittent abdominal discomfort which she reports as early satiety, "upset stomach," and occasional feeling of food "getting stuck" when swallowing.  She denies heartburn, epigastric pain, regurgitation, chest pain.  She also complains of long term symptoms that sound like uterine prolapse, with the feeling of "things falling out" of her vagina and sometimes difficulty urinating and having bowel movements.   For further medical history, meds, allergies and ROS, please see resident note.   Physical Exam: Blood pressure 105/52, pulse 64, temperature 98.7 F  (37.1 C), temperature source Oral, resp. rate 18, height 5\' 3"  (1.6 m), weight 159 lb 2.8 oz (72.2 kg), SpO2 92.00%. General appearance: alert, cooperative, appears stated age and no distress Head: Normocephalic, without obvious abnormality, atraumatic Eyes: conjunctivae/corneas clear. PERRL, EOM's intact.  Throat: MMM, dentures in place Lungs: some crackles at the bases, no wheezing Heart: RR, NR, no murmur Abdomen: soft, tender to deep palpation peri-umbilically, +BS, non distended, no guarding Extremities: normal, no trauma, no edema Pulses: 2+ in upper extremities and symmetric  Lab results: Results for orders placed during the hospital encounter of 01/14/12 (from the past 24 hour(s))  CBC WITH DIFFERENTIAL     Status: Abnormal   Collection Time   01/14/12  6:56 PM      Component Value Range   WBC 12.7 (*) 4.0 - 10.5 K/uL   RBC 3.97  3.87 - 5.11 MIL/uL   Hemoglobin 12.2  12.0 - 15.0 g/dL   HCT 96.2  95.2 - 84.1 %   MCV 90.7  78.0 - 100.0 fL   MCH 30.7  26.0 - 34.0 pg   MCHC 33.9  30.0 - 36.0 g/dL   RDW 32.4  40.1 - 02.7 %   Platelets 334  150 - 400 K/uL   Neutrophils Relative 83 (*) 43 - 77 %   Neutro Abs 10.5 (*) 1.7 - 7.7 K/uL   Lymphocytes Relative 8 (*) 12 - 46 %   Lymphs Abs 1.1  0.7 - 4.0 K/uL   Monocytes Relative 9  3 -  12 %   Monocytes Absolute 1.1 (*) 0.1 - 1.0 K/uL   Eosinophils Relative 0  0 - 5 %   Eosinophils Absolute 0.0  0.0 - 0.7 K/uL   Basophils Relative 0  0 - 1 %   Basophils Absolute 0.0  0.0 - 0.1 K/uL  COMPREHENSIVE METABOLIC PANEL     Status: Abnormal   Collection Time   01/14/12  6:56 PM      Component Value Range   Sodium 133 (*) 135 - 145 mEq/L   Potassium 4.4  3.5 - 5.1 mEq/L   Chloride 98  96 - 112 mEq/L   CO2 23  19 - 32 mEq/L   Glucose, Bld 100 (*) 70 - 99 mg/dL   BUN 12  6 - 23 mg/dL   Creatinine, Ser 1.61  0.50 - 1.10 mg/dL   Calcium 9.2  8.4 - 09.6 mg/dL   Total Protein 7.2  6.0 - 8.3 g/dL   Albumin 3.1 (*) 3.5 - 5.2 g/dL   AST 19   0 - 37 U/L   ALT 11  0 - 35 U/L   Alkaline Phosphatase 60  39 - 117 U/L   Total Bilirubin 1.0  0.3 - 1.2 mg/dL   GFR calc non Af Amer 76 (*) >90 mL/min   GFR calc Af Amer 88 (*) >90 mL/min  PROTIME-INR     Status: Abnormal   Collection Time   01/14/12 11:32 PM      Component Value Range   Prothrombin Time 16.6 (*) 11.6 - 15.2 seconds   INR 1.38  0.00 - 1.49  APTT     Status: Abnormal   Collection Time   01/14/12 11:32 PM      Component Value Range   aPTT 42 (*) 24 - 37 seconds  BASIC METABOLIC PANEL     Status: Abnormal   Collection Time   01/15/12  5:55 AM      Component Value Range   Sodium 136  135 - 145 mEq/L   Potassium 3.8  3.5 - 5.1 mEq/L   Chloride 101  96 - 112 mEq/L   CO2 24  19 - 32 mEq/L   Glucose, Bld 105 (*) 70 - 99 mg/dL   BUN 11  6 - 23 mg/dL   Creatinine, Ser 0.45  0.50 - 1.10 mg/dL   Calcium 8.1 (*) 8.4 - 10.5 mg/dL   GFR calc non Af Amer 76 (*) >90 mL/min   GFR calc Af Amer 88 (*) >90 mL/min  CBC     Status: Abnormal   Collection Time   01/15/12  5:55 AM      Component Value Range   WBC 10.4  4.0 - 10.5 K/uL   RBC 3.27 (*) 3.87 - 5.11 MIL/uL   Hemoglobin 10.0 (*) 12.0 - 15.0 g/dL   HCT 40.9 (*) 81.1 - 91.4 %   MCV 90.5  78.0 - 100.0 fL   MCH 30.6  26.0 - 34.0 pg   MCHC 33.8  30.0 - 36.0 g/dL   RDW 78.2  95.6 - 21.3 %   Platelets 277  150 - 400 K/uL    Imaging results:  Dg Chest 2 View  01/14/2012  *RADIOLOGY REPORT*  Clinical Data: Pneumonia.  Shortness of breath.  Congestion.  CHEST - 2 VIEW  Comparison: 01/02/2012.  Findings: Improved right lower lobe airspace opacity compatible with resolving pneumonia.  Mild post infectious/inflammatory changes.  Cardiopericardial silhouette appears within normal limits.  Left lung  clear. Right humeral head AVN incidentally noted.  IMPRESSION: Improved right lower lobe pneumonia with post infectious/inflammatory changes.   Original Report Authenticated By: Andreas Newport, M.D.    Dg Abd 1 View  01/14/2012   *RADIOLOGY REPORT*  Clinical Data: Abdominal pain.  ABDOMEN - 1 VIEW  Comparison: None.  Findings: Nonobstructive bowel gas pattern.  No plain film evidence of free air.  Thoracic and lumbar spondylosis is present.  The pelvis is not visualized on this single view abdomen radiograph.  IMPRESSION: No acute abnormality.   Original Report Authenticated By: Andreas Newport, M.D.    Ct Abdomen Pelvis W Contrast  01/14/2012  *RADIOLOGY REPORT*  Clinical Data: Abdominal pain  CT ABDOMEN AND PELVIS WITH CONTRAST  Technique:  Multidetector CT imaging of the abdomen and pelvis was performed following the standard protocol during bolus administration of intravenous contrast.  Contrast: OMNIPAQUE IOHEXOL 300 MG/ML  SOLN  Comparison: None.  Findings: Inflammatory changes of the sigmoid colon are present with stranding in the adjacent fat.  Diverticulosis is present in this area. There is however prominent gas within the region of the wall of the sigmoid colon worrisome for pneumatosis.  There is a 2.4 x 2.3 cm abscess containing fluid and gas adjacent to the sigmoid colon.  Other smaller abscesses containing gas bubbles are also noted.  There is a small amount of free fluid layering in the pelvis.  There is a 1.3 x 4.4 cm irregular peripheral pleural based parenchymal opacity in the peripheral basal segment of the right lower lobe.  Liver, gallbladder, spleen, pancreas are within normal limits. Several hypodensities are scattered throughout both kidneys and are difficult to characterize.  The larger ones are compatible with simple cyst.  1.6 x 2.0 cm nonspecific left adrenal nodule.  Mild prominence of the right adrenal gland.  Atherosclerotic changes of the visceral vasculature, aorta, and iliac vessels are present.  Advanced degenerative changes in the lumbar spine.  Umbilical hernia contains only adipose tissue.  IMPRESSION: There is an inflammatory process of the sigmoid colon with adjacent abscess compatible with  perforation.  Findings suggest pneumatosis of the colon.  Differential diagnosis includes advanced inflammatory process such as diverticulitis or ischemia.  Nonspecific left adrenal nodule. Follow-up MRI or CT in 12 months is recommended.  If there is a history of cancer, MRI is recommended at this time.  The  Multiple renal hypodensities which are nonspecific.  MRI can be performed to further characterize.  Critical Value/emergent results were called by telephone at the time of interpretation on 01/14/2012 at 1630 hours to Dr. Dallas Schimke, who verbally acknowledged these results.   Original Report Authenticated By: Jolaine Click, M.D.     Assessment and Plan: I agree with the formulated Assessment and Plan with the following changes:   1. Diverticulitis with abscess and microperforation - Surgery was consulted in the ED and is following - Cipro/Flagyl - IVF with D51/2NS - NPO for now, attempt sips tomorrow - Morphine PRN for pain - Nausea control - Microscopic hematuria is likely from inflammatory response around acute diverticulitis and proximity to urinary bladder, monitor  Other issues as per resident note.   Inez Catalina, MD 1/14/20143:18 PM

## 2012-01-15 NOTE — ED Notes (Signed)
IV ABX not in pyxis

## 2012-01-15 NOTE — ED Notes (Signed)
Taking patient to [address]

## 2012-01-15 NOTE — Progress Notes (Signed)
Patient ID: [name], female   DOB: 04-02-1925, 79 y.o.   MRN: [id number]    Subjective: Pt reports feeling better today, less pain, denies vomiting but some nausea  Objective: Vital signs in last 24 hours: Temp:  [97.6 F (36.4 C)-98.8 F (37.1 C)] 98.5 F (36.9 C) (01/14 0525) Pulse Rate:  [60-77] 60  (01/14 0525) Resp:  [16-22] 20  (01/14 0525) BP: (117-160)/(49-86) 123/49 mmHg (01/14 0525) SpO2:  [95 %-100 %] 98 % (01/14 0525) Weight:  [156 lb (70.761 kg)-159 lb 2.8 oz (72.2 kg)] 159 lb 2.8 oz (72.2 kg) (01/14 0500) Last BM Date: 01/14/12  Intake/Output from previous day:   Intake/Output this shift:    PE: Abd: soft but tender in left flank and umbilical region General: awake, NAD  Lab Results:   [facility] 01/15/12 0555 01/14/12 1856  WBC 10.4 12.7*  HGB 10.0* 12.2  HCT 29.6* 36.0  PLT 277 334   BMET  Basename 01/15/12 0555 01/14/12 1856  NA 136 133*  K 3.8 4.4  CL 101 98  CO2 24 23  GLUCOSE 105* 100*  BUN 11 12  CREATININE 0.74 0.75  CALCIUM 8.1* 9.2   PT/INR  Basename 01/14/12 2332  LABPROT 16.6*  INR 1.38   CMP     Component Value Date/Time   NA 136 01/15/2012 0555   K 3.8 01/15/2012 0555   CL 101 01/15/2012 0555   CO2 24 01/15/2012 0555   GLUCOSE 105* 01/15/2012 0555   BUN 11 01/15/2012 0555   CREATININE 0.74 01/15/2012 0555   CREATININE 0.70 01/14/2012 1330   CALCIUM 8.1* 01/15/2012 0555   PROT 7.2 01/14/2012 1856   ALBUMIN 3.1* 01/14/2012 1856   AST 19 01/14/2012 1856   ALT 11 01/14/2012 1856   ALKPHOS 60 01/14/2012 1856   BILITOT 1.0 01/14/2012 1856   GFRNONAA 76* 01/15/2012 0555   GFRAA 88* 01/15/2012 0555   Lipase  No results found for this basename: lipase       Studies/Results: Dg Chest 2 View  01/14/2012  *RADIOLOGY REPORT*  Clinical Data: Pneumonia.  Shortness of breath.  Congestion.  CHEST - 2 VIEW  Comparison: 01/02/2012.  Findings: Improved right lower lobe airspace opacity compatible with resolving pneumonia.  Mild post  infectious/inflammatory changes.  Cardiopericardial silhouette appears within normal limits.  Left lung clear. Right humeral head AVN incidentally noted.  IMPRESSION: Improved right lower lobe pneumonia with post infectious/inflammatory changes.   Original Report Authenticated By: Andreas Newport, M.D.    Dg Abd 1 View  01/14/2012  *RADIOLOGY REPORT*  Clinical Data: Abdominal pain.  ABDOMEN - 1 VIEW  Comparison: None.  Findings: Nonobstructive bowel gas pattern.  No plain film evidence of free air.  Thoracic and lumbar spondylosis is present.  The pelvis is not visualized on this single view abdomen radiograph.  IMPRESSION: No acute abnormality.   Original Report Authenticated By: Andreas Newport, M.D.    Ct Abdomen Pelvis W Contrast  01/14/2012  *RADIOLOGY REPORT*  Clinical Data: Abdominal pain  CT ABDOMEN AND PELVIS WITH CONTRAST  Technique:  Multidetector CT imaging of the abdomen and pelvis was performed following the standard protocol during bolus administration of intravenous contrast.  Contrast: OMNIPAQUE IOHEXOL 300 MG/ML  SOLN  Comparison: None.  Findings: Inflammatory changes of the sigmoid colon are present with stranding in the adjacent fat.  Diverticulosis is present in this area. There is however prominent gas within the region of the wall of the sigmoid colon worrisome for pneumatosis.  There is a 2.4 x 2.3 cm abscess containing fluid and gas adjacent to the sigmoid colon.  Other smaller abscesses containing gas bubbles are also noted.  There is a small amount of free fluid layering in the pelvis.  There is a 1.3 x 4.4 cm irregular peripheral pleural based parenchymal opacity in the peripheral basal segment of the right lower lobe.  Liver, gallbladder, spleen, pancreas are within normal limits. Several hypodensities are scattered throughout both kidneys and are difficult to characterize.  The larger ones are compatible with simple cyst.  1.6 x 2.0 cm nonspecific left adrenal nodule.  Mild  prominence of the right adrenal gland.  Atherosclerotic changes of the visceral vasculature, aorta, and iliac vessels are present.  Advanced degenerative changes in the lumbar spine.  Umbilical hernia contains only adipose tissue.  IMPRESSION: There is an inflammatory process of the sigmoid colon with adjacent abscess compatible with perforation.  Findings suggest pneumatosis of the colon.  Differential diagnosis includes advanced inflammatory process such as diverticulitis or ischemia.  Nonspecific left adrenal nodule. Follow-up MRI or CT in 12 months is recommended.  If there is a history of cancer, MRI is recommended at this time.  The  Multiple renal hypodensities which are nonspecific.  MRI can be performed to further characterize.  Critical Value/emergent results were called by telephone at the time of interpretation on 01/14/2012 at 1630 hours to Dr. Dallas Schimke, who verbally acknowledged these results.   Original Report Authenticated By: Jolaine Click, M.D.     Anti-infectives: Anti-infectives     Start     Dose/Rate Route Frequency Ordered Stop   01/15/12 0200   ciprofloxacin (CIPRO) IVPB 400 mg        400 mg 200 mL/hr over 60 Minutes Intravenous Every 12 hours 01/15/12 0006     01/15/12 0200   metroNIDAZOLE (FLAGYL) IVPB 500 mg        500 mg 100 mL/hr over 60 Minutes Intravenous Every 8 hours 01/15/12 0006     01/14/12 2245   ertapenem (INVANZ) 1 g in sodium chloride 0.9 % 50 mL IVPB        1 g 100 mL/hr over 30 Minutes Intravenous  Once 01/14/12 2232 01/15/12 0018           Assessment/Plan 1. Diverticulitis with microperforation: still tender, WBC trending down, would keep on bowel rest and IV abx, if worsens will need repeat CT to see if drainable area has developed, IVFs for hydration, will follow.  LOS: 1 day    WHITE, ELIZABETH 01/15/2012

## 2012-01-16 LAB — BASIC METABOLIC PANEL
BUN: 8 mg/dL (ref 6–23)
Chloride: 102 mEq/L (ref 96–112)
GFR calc Af Amer: 88 mL/min — ABNORMAL LOW (ref >90)
Glucose, Bld: 128 mg/dL — ABNORMAL HIGH (ref 70–99)
Potassium: 3.9 mEq/L (ref 3.5–5.1)
Sodium: 134 mEq/L — ABNORMAL LOW (ref 135–145)

## 2012-01-16 LAB — CBC
HCT: 28.7 % — ABNORMAL LOW (ref 36.0–46.0)
Hemoglobin: 9.7 g/dL — ABNORMAL LOW (ref 12.0–15.0)
RDW: 12.5 % (ref 11.5–15.5)
WBC: 7.9 10*3/uL (ref 4.0–10.5)

## 2012-01-16 MED ORDER — PNEUMOCOCCAL VAC POLYVALENT 25 MCG/0.5ML IJ INJ
0.5000 mL | INJECTION | INTRAMUSCULAR | Status: AC
  Administered 2012-01-17: 0.5 mL via INTRAMUSCULAR
  Filled 2012-01-16: qty 0.5

## 2012-01-16 MED ORDER — HYDROCOD POLST-CHLORPHEN POLST 10-8 MG/5ML PO LQCR
5.0000 mL | Freq: Two times a day (BID) | ORAL | Status: DC | PRN
  Administered 2012-01-16 – 2012-01-21 (×6): 5 mL via ORAL
  Filled 2012-01-16 (×7): qty 5

## 2012-01-16 NOTE — Progress Notes (Signed)
Subjective: She is doing better today with less pain. Objective: Vital signs in last 24 hours: Filed Vitals:   01/15/12 0525 01/15/12 1424 01/15/12 2145 01/16/12 0601  BP: 123/49 105/52 114/51 126/54  Pulse: 60 64 67 60  Temp: 98.5 F (36.9 C) 98.7 F (37.1 C) 98.1 F (36.7 C) 98.6 F (37 C)  TempSrc: Oral  Oral Oral  Resp: 20 18 18 24   Height:      Weight:      SpO2: 98% 92% 97% 98%   Weight change:   General appearance: alert, cooperative, fatigued and no distress Lungs: clear to auscultation bilaterally Heart: regular rate and rhythm, S1, S2 normal, no murmur, click, rub or gallop Abdomen: soft, non-tender; bowel sounds normal; no masses,  no organomegaly Extremities: extremities normal, atraumatic, no cyanosis or edema Pulses: 2+ and symmetric Neurologic: Grossly normal Lab Results: Basic Metabolic Panel:  Lab 01/16/12 8657 01/15/12 0555  NA 134* 136  K 3.9 3.8  CL 102 101  CO2 25 24  GLUCOSE 128* 105*  BUN 8 11  CREATININE 0.74 0.74  CALCIUM 8.1* 8.1*  MG -- --  PHOS -- --   Liver Function Tests:  Lab 01/14/12 1856 01/14/12 1330  AST 19 14  ALT 11 13  ALKPHOS 60 61  BILITOT 1.0 1.4*  PROT 7.2 6.8  ALBUMIN 3.1* 3.8   CBC:  Lab 01/16/12 0500 01/15/12 0555 01/14/12 1856  WBC 7.9 10.4 --  NEUTROABS -- -- 10.5*  HGB 9.7* 10.0* --  HCT 28.7* 29.6* --  MCV 89.7 90.5 --  PLT 262 277 --   Coagulation:  Lab 01/14/12 2332  LABPROT 16.6*  INR 1.38   Urine Drug Screen: Drugs of Abuse     Component Value Date/Time   LABOPIA NONE DETECTED 01/03/2007 0006   COCAINSCRNUR NONE DETECTED 01/03/2007 0006   LABBENZ NONE DETECTED 01/03/2007 0006   AMPHETMU NONE DETECTED 01/03/2007 0006   THCU NONE DETECTED 01/03/2007 0006   LABBARB  Value: NONE DETECTED        DRUG SCREEN FOR MEDICAL PURPOSES ONLY.  IF CONFIRMATION IS NEEDED FOR ANY PURPOSE, NOTIFY LAB WITHIN 5 DAYS.        LOWEST DETECTABLE LIMITS FOR URINE DRUG SCREEN Drug Class       Cutoff (ng/mL) Amphetamine       1000 Barbiturate      200 Benzodiazepine   200 Cocaine          300 Opiates          300 THC              50 01/03/2007 0006     Urinalysis:  Lab 01/14/12 1246  COLORURINE --  LABSPEC --  PHURINE --  GLUCOSEU --  HGBUR --  BILIRUBINUR neg  KETONESUR --  PROTEINUR --  UROBILINOGEN 0.2  NITRITE neg  LEUKOCYTESUR Negative    Micro Results: Recent Results (from the past 240 hour(s))  URINE CULTURE     Status: Normal   Collection Time   01/14/12 12:44 PM      Component Value Range Status Comment   Colony Count NO GROWTH   Final    Organism ID, Bacteria NO GROWTH   Final   CULTURE, BLOOD (ROUTINE X 2)     Status: Normal (Preliminary result)   Collection Time   01/14/12 11:30 PM      Component Value Range Status Comment   Specimen Description BLOOD ARM RIGHT   Final  Special Requests BOTTLES DRAWN AEROBIC AND ANAEROBIC 10CC   Final    Culture  Setup Time 01/15/2012 07:53   Final    Culture     Final    Value:        BLOOD CULTURE RECEIVED NO GROWTH TO DATE CULTURE WILL BE HELD FOR 5 DAYS BEFORE ISSUING A FINAL NEGATIVE REPORT   Report Status PENDING   Incomplete   CULTURE, BLOOD (ROUTINE X 2)     Status: Normal (Preliminary result)   Collection Time   01/14/12 11:35 PM      Component Value Range Status Comment   Specimen Description BLOOD HAND RIGHT   Final    Special Requests BOTTLES DRAWN AEROBIC ONLY 10CC   Final    Culture  Setup Time 01/15/2012 07:54   Final    Culture     Final    Value:        BLOOD CULTURE RECEIVED NO GROWTH TO DATE CULTURE WILL BE HELD FOR 5 DAYS BEFORE ISSUING A FINAL NEGATIVE REPORT   Report Status PENDING   Incomplete    Studies/Results: Dg Chest 2 View  01/14/2012  *RADIOLOGY REPORT*  Clinical Data: Pneumonia.  Shortness of breath.  Congestion.  CHEST - 2 VIEW  Comparison: 01/02/2012.  Findings: Improved right lower lobe airspace opacity compatible with resolving pneumonia.  Mild post infectious/inflammatory changes.  Cardiopericardial silhouette  appears within normal limits.  Left lung clear. Right humeral head AVN incidentally noted.  IMPRESSION: Improved right lower lobe pneumonia with post infectious/inflammatory changes.   Original Report Authenticated By: Andreas Newport, M.D.    Dg Abd 1 View  01/14/2012  *RADIOLOGY REPORT*  Clinical Data: Abdominal pain.  ABDOMEN - 1 VIEW  Comparison: None.  Findings: Nonobstructive bowel gas pattern.  No plain film evidence of free air.  Thoracic and lumbar spondylosis is present.  The pelvis is not visualized on this single view abdomen radiograph.  IMPRESSION: No acute abnormality.   Original Report Authenticated By: Andreas Newport, M.D.    Ct Abdomen Pelvis W Contrast  01/14/2012  *RADIOLOGY REPORT*  Clinical Data: Abdominal pain  CT ABDOMEN AND PELVIS WITH CONTRAST  Technique:  Multidetector CT imaging of the abdomen and pelvis was performed following the standard protocol during bolus administration of intravenous contrast.  Contrast: OMNIPAQUE IOHEXOL 300 MG/ML  SOLN  Comparison: None.  Findings: Inflammatory changes of the sigmoid colon are present with stranding in the adjacent fat.  Diverticulosis is present in this area. There is however prominent gas within the region of the wall of the sigmoid colon worrisome for pneumatosis.  There is a 2.4 x 2.3 cm abscess containing fluid and gas adjacent to the sigmoid colon.  Other smaller abscesses containing gas bubbles are also noted.  There is a small amount of free fluid layering in the pelvis.  There is a 1.3 x 4.4 cm irregular peripheral pleural based parenchymal opacity in the peripheral basal segment of the right lower lobe.  Liver, gallbladder, spleen, pancreas are within normal limits. Several hypodensities are scattered throughout both kidneys and are difficult to characterize.  The larger ones are compatible with simple cyst.  1.6 x 2.0 cm nonspecific left adrenal nodule.  Mild prominence of the right adrenal gland.  Atherosclerotic changes  of the visceral vasculature, aorta, and iliac vessels are present.  Advanced degenerative changes in the lumbar spine.  Umbilical hernia contains only adipose tissue.  IMPRESSION: There is an inflammatory process of the sigmoid colon with  adjacent abscess compatible with perforation.  Findings suggest pneumatosis of the colon.  Differential diagnosis includes advanced inflammatory process such as diverticulitis or ischemia.  Nonspecific left adrenal nodule. Follow-up MRI or CT in 12 months is recommended.  If there is a history of cancer, MRI is recommended at this time.  The  Multiple renal hypodensities which are nonspecific.  MRI can be performed to further characterize.  Critical Value/emergent results were called by telephone at the time of interpretation on 01/14/2012 at 1630 hours to Dr. Dallas Schimke, who verbally acknowledged these results.   Original Report Authenticated By: Jolaine Click, M.D.    Medications: I have reviewed the patient's current medications. Scheduled Meds:   . ciprofloxacin  400 mg Intravenous Q12H  . metronidazole  500 mg Intravenous Q8H  . pneumococcal 23 valent vaccine  0.5 mL Intramuscular Tomorrow-1000   Continuous Infusions:   . dextrose 5 % and 0.45% NaCl 125 mL/hr at 01/16/12 0844   PRN Meds:.albuterol, albuterol-ipratropium, morphine injection, ondansetron (ZOFRAN) IV, ondansetron Assessment/Plan: [name] is an 79yo woman who presented to the [facility] ED for evaluation of abdominal pain which was confirmed on imaging to be caused by diverticulitis.  Diverticulitis with perforation, abscess formation, and colonic pneumatosis: This patient does not have a history of diverticulitis; but her history, exam findings, and radiographic findings are consistent with diverticulitis. There is evidence of pneumatosis in the adjacent colonic wall and a pericolonic abscess (2.4 x 2.3 cm). Surgery has been following her while getting medical therapy to which she is responding well. She is  less sore and abdominal exam is less tender. She is afebrile. He leukocytosis has improved from 10.4 to 7.9.   Plan - start clear fluids  - continue with Ciprofloxacin and flagyl. This is day 3 of abx therapy - appreciate Surgery input  - continue with IV fluid D5W/half normal for now until she is fully oral - 150 cc/hr. We will escalade her diet as tolerable. - Morphine 2 mg every 2 hours as needed for pain   Microscopic hematuria: Urinalysis demonstrates moderate hemoglobin, positive nitrites, 3-6 WBCs, and 10-15 RBCs. Her symptoms of dysuria are concerning for cystitis, but this will be covered with antibiotics used to treat her diverticulitis. These findings can also be explained by the appositional pericolonic inflammation. She will need a followup urinalysis in the outpatient setting, following resolution of the intra-abdominal inflammation.  - Treat diverticulitis as above - will do another urinalysis tomorrow   Community acquired pneumonia: Symptoms have resolved. Course of antibiotics completed. Radiographic signs of infection in the right lung are improved.   COPD: Stable. At home she uses albuterol and albuterol/ipratropium as needed for wheezing or shortness of breath. We'll continue these here.  - Albuterol and albuterol/ipratropium every 6 hours as needed   Hypertension: At home, she takes lisinopril 10 mg daily. Over the past couple months, her blood pressures have ranged from 160-100/80-60. On average it seems to be running 135/75. Current BP is normal at 126/54. Plan  - Hold lisinopril    Hyponatremia: Sodium on admission was 133. Other electrolytes were normal. We will continue to monitor this and hydrate with normal saline at 75 mL/hr.  - BMET in AM   Incidental pulmonary and adrenal nodules: 1.3 x 4.4 cm irregular peripheral pleural based parenchymal opacity in the right lower lobe on computed tomography. There is also a 1. 6 x 2.0 cm left adrenal nodule. The radiologist  recommends followup imaging in 12 months for the adrenal  nodule. According to the Fleischner Society recommendations, this pulmonary nodule will require imaging followup with a contrasted CT in 3 months. We will defer these for further evaluation as outpatient.   Prophylaxis: We will hold off on pharmacologic VTE until surgery gives [place] the recommendations. We will use SCDs for now.   Disposition: Patient's PCP is Dr. Windle Guard (unassigned), and she will not require OPC followup. Expected length of stay is greater than 2 days.     LOS: 2 days   Dow Adolph 01/16/2012, 1:00 PM

## 2012-01-16 NOTE — Progress Notes (Signed)
Pt c/o of continuous coughing and coughing up small amount clear sputum.  Pt's daughter stated she had just finished a course of antibiotics for pneumonia before this admission.  Afebrile, P - 63, O2 - 93% on 1L.  MD notified and new order received.  Will continue to monitor.Rachelle Hora, Sharyon Medicus

## 2012-01-16 NOTE — Progress Notes (Signed)
Subjective: No n/v. Some flatus. No bm. Less pain  Objective: Vital signs in last 24 hours: Temp:  [98.1 F (36.7 C)-98.7 F (37.1 C)] 98.6 F (37 C) (01/15 0601) Pulse Rate:  [60-67] 60  (01/15 0601) Resp:  [18-24] 24  (01/15 0601) BP: (105-126)/(51-54) 126/54 mmHg (01/15 0601) SpO2:  [92 %-98 %] 98 % (01/15 0601) Last BM Date: 01/14/12  Intake/Output from previous day: 01/14 0701 - 01/15 0700 In: 1500 [I.V.:1200; IV Piggyback:300] Out: -  Intake/Output this shift:    Alert, nad cta ant Reg Soft, obese, +BS; mild TTP LLQ  Lab Results:   Basename 01/16/12 0500 01/15/12 0555  WBC 7.9 10.4  HGB 9.7* 10.0*  HCT 28.7* 29.6*  PLT 262 277   BMET  Basename 01/16/12 0500 01/15/12 0555  NA 134* 136  K 3.9 3.8  CL 102 101  CO2 25 24  GLUCOSE 128* 105*  BUN 8 11  CREATININE 0.74 0.74  CALCIUM 8.1* 8.1*   PT/INR  Basename 01/14/12 2332  LABPROT 16.6*  INR 1.38   ABG No results found for this basename: PHART:2,PCO2:2,PO2:2,HCO3:2 in the last 72 hours  Studies/Results: Dg Chest 2 View  01/14/2012  *RADIOLOGY REPORT*  Clinical Data: Pneumonia.  Shortness of breath.  Congestion.  CHEST - 2 VIEW  Comparison: 01/02/2012.  Findings: Improved right lower lobe airspace opacity compatible with resolving pneumonia.  Mild post infectious/inflammatory changes.  Cardiopericardial silhouette appears within normal limits.  Left lung clear. Right humeral head AVN incidentally noted.  IMPRESSION: Improved right lower lobe pneumonia with post infectious/inflammatory changes.   Original Report Authenticated By: Andreas Newport, M.D.    Dg Abd 1 View  01/14/2012  *RADIOLOGY REPORT*  Clinical Data: Abdominal pain.  ABDOMEN - 1 VIEW  Comparison: None.  Findings: Nonobstructive bowel gas pattern.  No plain film evidence of free air.  Thoracic and lumbar spondylosis is present.  The pelvis is not visualized on this single view abdomen radiograph.  IMPRESSION: No acute abnormality.    Original Report Authenticated By: Andreas Newport, M.D.    Ct Abdomen Pelvis W Contrast  01/14/2012  *RADIOLOGY REPORT*  Clinical Data: Abdominal pain  CT ABDOMEN AND PELVIS WITH CONTRAST  Technique:  Multidetector CT imaging of the abdomen and pelvis was performed following the standard protocol during bolus administration of intravenous contrast.  Contrast: OMNIPAQUE IOHEXOL 300 MG/ML  SOLN  Comparison: None.  Findings: Inflammatory changes of the sigmoid colon are present with stranding in the adjacent fat.  Diverticulosis is present in this area. There is however prominent gas within the region of the wall of the sigmoid colon worrisome for pneumatosis.  There is a 2.4 x 2.3 cm abscess containing fluid and gas adjacent to the sigmoid colon.  Other smaller abscesses containing gas bubbles are also noted.  There is a small amount of free fluid layering in the pelvis.  There is a 1.3 x 4.4 cm irregular peripheral pleural based parenchymal opacity in the peripheral basal segment of the right lower lobe.  Liver, gallbladder, spleen, pancreas are within normal limits. Several hypodensities are scattered throughout both kidneys and are difficult to characterize.  The larger ones are compatible with simple cyst.  1.6 x 2.0 cm nonspecific left adrenal nodule.  Mild prominence of the right adrenal gland.  Atherosclerotic changes of the visceral vasculature, aorta, and iliac vessels are present.  Advanced degenerative changes in the lumbar spine.  Umbilical hernia contains only adipose tissue.  IMPRESSION: There is an inflammatory process  of the sigmoid colon with adjacent abscess compatible with perforation.  Findings suggest pneumatosis of the colon.  Differential diagnosis includes advanced inflammatory process such as diverticulitis or ischemia.  Nonspecific left adrenal nodule. Follow-up MRI or CT in 12 months is recommended.  If there is a history of cancer, MRI is recommended at this time.  The  Multiple  renal hypodensities which are nonspecific.  MRI can be performed to further characterize.  Critical Value/emergent results were called by telephone at the time of interpretation on 01/14/2012 at 1630 hours to Dr. Dallas Schimke, who verbally acknowledged these results.   Original Report Authenticated By: Jolaine Click, M.D.     Anti-infectives: Anti-infectives     Start     Dose/Rate Route Frequency Ordered Stop   01/15/12 0200   ciprofloxacin (CIPRO) IVPB 400 mg        400 mg 200 mL/hr over 60 Minutes Intravenous Every 12 hours 01/15/12 0006     01/15/12 0200   metroNIDAZOLE (FLAGYL) IVPB 500 mg        500 mg 100 mL/hr over 60 Minutes Intravenous Every 8 hours 01/15/12 0006     01/14/12 2245   ertapenem (INVANZ) 1 g in sodium chloride 0.9 % 50 mL IVPB        1 g 100 mL/hr over 30 Minutes Intravenous  Once 01/14/12 2232 01/15/12 0018          Assessment/Plan: Sigmoid diverticulitis with microperforation  No fever, wbc nml, no tachy, less pain, appears less tender on exam..appears to be responding to non-surgical management Cont IV abx Will SIPS of clears Repeat labs in AM  [name] Sella. Andrey Campanile, MD, FACS General, Bariatric, & Minimally Invasive Surgery [facility], [place]   LOS: 2 days    Atilano Ina 01/16/2012

## 2012-01-17 ENCOUNTER — Inpatient Hospital Stay (HOSPITAL_COMMUNITY): Payer: Medicare Other

## 2012-01-17 LAB — BASIC METABOLIC PANEL
BUN: 4 mg/dL — ABNORMAL LOW (ref 6–23)
CO2: 27 mEq/L (ref 19–32)
Chloride: 103 mEq/L (ref 96–112)
GFR calc non Af Amer: 78 mL/min — ABNORMAL LOW (ref >90)
Glucose, Bld: 115 mg/dL — ABNORMAL HIGH (ref 70–99)
Potassium: 3.5 mEq/L (ref 3.5–5.1)

## 2012-01-17 LAB — CBC
HCT: 29.6 % — ABNORMAL LOW (ref 36.0–46.0)
Hemoglobin: 10.7 g/dL — ABNORMAL LOW (ref 12.0–15.0)
MCHC: 36.1 g/dL — ABNORMAL HIGH (ref 30.0–36.0)
RBC: 3.17 MIL/uL — ABNORMAL LOW (ref 3.87–5.11)

## 2012-01-17 MED ORDER — METRONIDAZOLE 500 MG PO TABS
500.0000 mg | ORAL_TABLET | Freq: Three times a day (TID) | ORAL | Status: DC
  Administered 2012-01-17 – 2012-01-19 (×5): 500 mg via ORAL
  Filled 2012-01-17 (×8): qty 1

## 2012-01-17 MED ORDER — LEVOFLOXACIN 500 MG PO TABS
500.0000 mg | ORAL_TABLET | Freq: Every day | ORAL | Status: DC
  Administered 2012-01-18 – 2012-01-19 (×2): 500 mg via ORAL
  Filled 2012-01-17 (×2): qty 1

## 2012-01-17 NOTE — Progress Notes (Signed)
Subjective: She is doing better today with less pain. However, she complains of a cough last night without chest pain, SOB or sputum production. She has no fever. She is tolerating her diet well. Objective: Vital signs in last 24 hours: Filed Vitals:   01/16/12 2122 01/17/12 0500 01/17/12 0531 01/17/12 1356  BP: 144/63  136/61 133/76  Pulse: 67  68 68  Temp: 97.6 F (36.4 C)  98.1 F (36.7 C) 98.5 F (36.9 C)  TempSrc: Oral  Oral Oral  Resp: 18  18 18   Height:      Weight:  160 lb 15 oz (73 kg)    SpO2: 94%  96% 98%   Weight change:   General appearance: alert, cooperative, fatigued and no distress Lungs: clear to auscultation bilaterally Heart: regular rate and rhythm, S1, S2 normal, no murmur, click, rub or gallop Abdomen: soft, non-tender; bowel sounds normal; no masses,  no organomegaly Extremities: extremities normal, atraumatic, no cyanosis or edema Pulses: 2+ and symmetric Neurologic: Grossly normal Lab Results: Basic Metabolic Panel:  Lab 01/17/12 1610 01/16/12 0500  NA 139 134*  K 3.5 3.9  CL 103 102  CO2 27 25  GLUCOSE 115* 128*  BUN 4* 8  CREATININE 0.69 0.74  CALCIUM 8.5 8.1*  MG -- --  PHOS -- --   Liver Function Tests:  Lab 01/14/12 1856 01/14/12 1330  AST 19 14  ALT 11 13  ALKPHOS 60 61  BILITOT 1.0 1.4*  PROT 7.2 6.8  ALBUMIN 3.1* 3.8   CBC:  Lab 01/17/12 0610 01/16/12 0500 01/14/12 1856  WBC 5.0 7.9 --  NEUTROABS -- -- 10.5*  HGB 10.7* 9.7* --  HCT 29.6* 28.7* --  MCV 93.4 89.7 --  PLT 325 262 --   Coagulation:  Lab 01/14/12 2332  LABPROT 16.6*  INR 1.38   Urine Drug Screen: Drugs of Abuse     Component Value Date/Time   LABOPIA NONE DETECTED 01/03/2007 0006   COCAINSCRNUR NONE DETECTED 01/03/2007 0006   LABBENZ NONE DETECTED 01/03/2007 0006   AMPHETMU NONE DETECTED 01/03/2007 0006   THCU NONE DETECTED 01/03/2007 0006   LABBARB  Value: NONE DETECTED        DRUG SCREEN FOR MEDICAL PURPOSES ONLY.  IF CONFIRMATION IS NEEDED FOR ANY  PURPOSE, NOTIFY LAB WITHIN 5 DAYS.        LOWEST DETECTABLE LIMITS FOR URINE DRUG SCREEN Drug Class       Cutoff (ng/mL) Amphetamine      1000 Barbiturate      200 Benzodiazepine   200 Cocaine          300 Opiates          300 THC              50 01/03/2007 0006     Urinalysis:  Lab 01/14/12 1246  COLORURINE --  LABSPEC --  PHURINE --  GLUCOSEU --  HGBUR --  BILIRUBINUR neg  KETONESUR --  PROTEINUR --  UROBILINOGEN 0.2  NITRITE neg  LEUKOCYTESUR Negative    Micro Results: Recent Results (from the past 240 hour(s))  URINE CULTURE     Status: Normal   Collection Time   01/14/12 12:44 PM      Component Value Range Status Comment   Colony Count NO GROWTH   Final    Organism ID, Bacteria NO GROWTH   Final   CULTURE, BLOOD (ROUTINE X 2)     Status: Normal (Preliminary result)   Collection Time  01/14/12 11:30 PM      Component Value Range Status Comment   Specimen Description BLOOD ARM RIGHT   Final    Special Requests BOTTLES DRAWN AEROBIC AND ANAEROBIC 10CC   Final    Culture  Setup Time 01/15/2012 07:53   Final    Culture     Final    Value:        BLOOD CULTURE RECEIVED NO GROWTH TO DATE CULTURE WILL BE HELD FOR 5 DAYS BEFORE ISSUING A FINAL NEGATIVE REPORT   Report Status PENDING   Incomplete   CULTURE, BLOOD (ROUTINE X 2)     Status: Normal (Preliminary result)   Collection Time   01/14/12 11:35 PM      Component Value Range Status Comment   Specimen Description BLOOD HAND RIGHT   Final    Special Requests BOTTLES DRAWN AEROBIC ONLY 10CC   Final    Culture  Setup Time 01/15/2012 07:54   Final    Culture     Final    Value:        BLOOD CULTURE RECEIVED NO GROWTH TO DATE CULTURE WILL BE HELD FOR 5 DAYS BEFORE ISSUING A FINAL NEGATIVE REPORT   Report Status PENDING   Incomplete    Studies/Results: Dg Chest 2 View  01/17/2012  *RADIOLOGY REPORT*  Clinical Data: Cough, short of breath, wheezing  CHEST - 2 VIEW  Comparison: Chest x-ray of 01/14/2012  Findings: The lungs are  clear and slightly hyperaerated.  Somewhat prominent perihilar markings may be due to bronchitis. Cardiomegaly is stable.  There are degenerative changes throughout the thoracic spine. Calcifications in the upper chest - lower neck probably represent calcified thyroid nodules.  IMPRESSION: No pneumonia.  Cannot exclude bronchitis.   Original Report Authenticated By: Dwyane Dee, M.D.    Medications: I have reviewed the patient's current medications. Scheduled Meds:    . levofloxacin  500 mg Oral Daily  . metroNIDAZOLE  500 mg Oral Q8H   Continuous Infusions:    . dextrose 5 % and 0.45% NaCl 125 mL/hr at 01/16/12 0844   PRN Meds:.albuterol, albuterol-ipratropium, chlorpheniramine-HYDROcodone, morphine injection, ondansetron (ZOFRAN) IV, ondansetron Assessment/Plan: [name] is an 79yo woman who presented to the [facility] ED for evaluation of abdominal pain which was confirmed on imaging to be caused by diverticulitis.  Diverticulitis with perforation, abscess formation, and colonic pneumatosis: [name] did not have a history of diverticulitis; but her history, exam findings, and radiographic findings were consistent with diverticulitis. There was evidence of pneumatosis in the adjacent colonic wall and a pericolonic abscess (2.4 x 2.3 cm). She was admitted to a med-surg bed and consulted surgery who recommended etting medical therapy. She responded very well to ciprofloxacin and metronidazole. Repeat example continued to show less tenderness. She remained afebrile. He leukocytosis improved from 10.4 to 7.9.  Plan - changed to clear diet - change to oral Levaquin and flagyl. This is day 4 of abx therapy - appreciate Surgery input  - will d/c iv fluids tomorrow AM if she continues to do well  - Morphine 2 mg every 2 hours as needed for pain   Microscopic hematuria: Urinalysis demonstrates moderate hemoglobin, positive nitrites, 3-6 WBCs, and 10-15 RBCs. Her symptoms of dysuria are concerning for  cystitis, but this will be covered with antibiotics used to treat her diverticulitis. These findings can also be explained by the appositional pericolonic inflammation. She will need a followup urinalysis in the outpatient setting, following resolution of the intra-abdominal inflammation.  -  Treat diverticulitis as above - will do another urinalysis tomorrow   Community acquired pneumonia: Symptoms had resolved on presentation to ED with diverticulitis however, she started having coughs on 1/16. Her lung exam was normal . A chest x ray was normal. resolved.  Plan  - will do Levaquin in place of oral cipro for treatment of any respiratory tract infection if present   COPD: Stable. At home she uses albuterol and albuterol/ipratropium as needed for wheezing or shortness of breath. We'll continue these here.  - Albuterol and albuterol/ipratropium every 6 hours as needed   Hypertension: At home, she takes lisinopril 10 mg daily. Over the past couple months, her blood pressures have ranged from 160-100/80-60. On average it seems to be running 135/75. Current BP is normal at 126/54. Plan  - Hold lisinopril    Hyponatremia: Sodium on admission was 133. Other electrolytes were normal. We will continue to monitor this and hydrate with  D5 half normal Plan  Monitor BMET  Incidental pulmonary and adrenal nodules: 1.3 x 4.4 cm irregular peripheral pleural based parenchymal opacity in the right lower lobe on computed tomography. There is also a 1. 6 x 2.0 cm left adrenal nodule. The radiologist recommends followup imaging in 12 months for the adrenal nodule. According to the Fleischner Society recommendations, this pulmonary nodule will require imaging followup with a contrasted CT in 3 months. We will defer these for further evaluation as outpatient.   Prophylaxis: We will hold off on pharmacologic VTE until surgery gives [place] the recommendations. We will use SCDs for now.   Disposition: Patient's PCP is  Dr. Windle Guard (unassigned), and she will not require OPC followup. Expected length of stay is greater than 2 days.     LOS: 3 days   Dow Adolph 01/17/2012, 6:20 PM

## 2012-01-17 NOTE — Progress Notes (Signed)
Patient ID: EMPRESS NEWMANN, female   DOB: 03/05/1925, 79 y.o.   MRN: [id number]    Subjective: No complaints.  Minimal pain, tolerating a regular diet this morning.  Objective: Vital signs in last 24 hours: Temp:  [97.6 F (36.4 C)-98.5 F (36.9 C)] 98.1 F (36.7 C) (01/16 0531) Pulse Rate:  [63-70] 68  (01/16 0531) Resp:  [18-20] 18  (01/16 0531) BP: (124-144)/(59-63) 136/61 mmHg (01/16 0531) SpO2:  [93 %-97 %] 96 % (01/16 0531) Weight:  [160 lb 15 oz (73 kg)] 160 lb 15 oz (73 kg) (01/16 0500) Last BM Date: 01/14/12  Intake/Output from previous day: 01/15 0701 - 01/16 0700 In: 2303.3 [P.O.:100; I.V.:2203.3] Out: 853 [Urine:853] Intake/Output this shift:    PE: Abd: soft, NT, ND, +BS  Lab Results:   Basename 01/17/12 0610 01/16/12 0500  WBC 5.0 7.9  HGB 10.7* 9.7*  HCT 29.6* 28.7*  PLT 325 262   BMET  Basename 01/17/12 0610 01/16/12 0500  NA 139 134*  K 3.5 3.9  CL 103 102  CO2 27 25  GLUCOSE 115* 128*  BUN 4* 8  CREATININE 0.69 0.74  CALCIUM 8.5 8.1*   PT/INR  Basename 01/14/12 2332  LABPROT 16.6*  INR 1.38   CMP     Component Value Date/Time   NA 139 01/17/2012 0610   K 3.5 01/17/2012 0610   CL 103 01/17/2012 0610   CO2 27 01/17/2012 0610   GLUCOSE 115* 01/17/2012 0610   BUN 4* 01/17/2012 0610   CREATININE 0.69 01/17/2012 0610   CREATININE 0.70 01/14/2012 1330   CALCIUM 8.5 01/17/2012 0610   PROT 7.2 01/14/2012 1856   ALBUMIN 3.1* 01/14/2012 1856   AST 19 01/14/2012 1856   ALT 11 01/14/2012 1856   ALKPHOS 60 01/14/2012 1856   BILITOT 1.0 01/14/2012 1856   GFRNONAA 78* 01/17/2012 0610   GFRAA >90 01/17/2012 0610   Lipase  No results found for this basename: lipase       Studies/Results: No results found.  Anti-infectives: Anti-infectives     Start     Dose/Rate Route Frequency Ordered Stop   01/15/12 0200   ciprofloxacin (CIPRO) IVPB 400 mg        400 mg 200 mL/hr over 60 Minutes Intravenous Every 12 hours 01/15/12 0006     01/15/12 0200    metroNIDAZOLE (FLAGYL) IVPB 500 mg        500 mg 100 mL/hr over 60 Minutes Intravenous Every 8 hours 01/15/12 0006     01/14/12 2245   ertapenem (INVANZ) 1 g in sodium chloride 0.9 % 50 mL IVPB        1 g 100 mL/hr over 30 Minutes Intravenous  Once 01/14/12 2232 01/15/12 0018           Assessment/Plan  1. Diverticulitis with microperforation  Plan: 1. She was started just on sips of clears yesterday and is not on a regular diet.  She is tolerating this well so far.  Will switch her to a low fiber diet. 2. Could switch to oral abx therapy.  Would give another 7-10 days worth of abx 3. No need for repeat CT scan at this point given improvement in her pain, but if it worsens, she will need a repeat scan.   LOS: 3 days    Raya Mckinstry E 01/17/2012, 9:15 AM Pager: [phone]

## 2012-01-17 NOTE — Progress Notes (Signed)
Internal Medicine Attending  Date: 01/17/2012  Patient name: [name] Medical record number: [id number] Date of birth: July 03, 1925 Age: 79 y.o. Gender: female  I saw and evaluated the patient, and discussed her care with house staff. She is doing well today, reports some nausea earlier today which has resolved, no abdominal pain, taking food by mouth.  She is afebrile;exam shows no abdominal tenderness.  Her WBC is normal.  She was transitioned to oral antibiotics today.  Plan is continue antibiotic treatment of diverticulitis, follow abdominal exam and clinical status.

## 2012-01-18 ENCOUNTER — Encounter (HOSPITAL_COMMUNITY): Payer: Self-pay | Admitting: Radiology

## 2012-01-18 ENCOUNTER — Inpatient Hospital Stay (HOSPITAL_COMMUNITY): Payer: Medicare Other

## 2012-01-18 DIAGNOSIS — K630 Abscess of intestine: Secondary | ICD-10-CM

## 2012-01-18 LAB — URINALYSIS, ROUTINE W REFLEX MICROSCOPIC
Bilirubin Urine: NEGATIVE
Glucose, UA: NEGATIVE mg/dL
Hgb urine dipstick: NEGATIVE
Protein, ur: NEGATIVE mg/dL
Urobilinogen, UA: 0.2 mg/dL (ref 0.0–1.0)

## 2012-01-18 LAB — CBC
HCT: 29.6 % — ABNORMAL LOW (ref 36.0–46.0)
Hemoglobin: 9.9 g/dL — ABNORMAL LOW (ref 12.0–15.0)
MCH: 29.8 pg (ref 26.0–34.0)
MCHC: 33.4 g/dL (ref 30.0–36.0)
MCV: 89.2 fL (ref 78.0–100.0)

## 2012-01-18 LAB — BASIC METABOLIC PANEL
BUN: 4 mg/dL — ABNORMAL LOW (ref 6–23)
CO2: 26 mEq/L (ref 19–32)
Calcium: 8.1 mg/dL — ABNORMAL LOW (ref 8.4–10.5)
Creatinine, Ser: 0.66 mg/dL (ref 0.50–1.10)
GFR calc non Af Amer: 79 mL/min — ABNORMAL LOW (ref >90)
Glucose, Bld: 138 mg/dL — ABNORMAL HIGH (ref 70–99)

## 2012-01-18 LAB — URINE MICROSCOPIC-ADD ON

## 2012-01-18 MED ORDER — IOHEXOL 300 MG/ML  SOLN
25.0000 mL | INTRAMUSCULAR | Status: AC
  Administered 2012-01-18: 25 mL via ORAL

## 2012-01-18 MED ORDER — IOHEXOL 300 MG/ML  SOLN
100.0000 mL | Freq: Once | INTRAMUSCULAR | Status: AC | PRN
  Administered 2012-01-18: 100 mL via INTRAVENOUS

## 2012-01-18 NOTE — Progress Notes (Addendum)
Internal Medicine Attending  Date: 01/18/2012  Patient name: [name] Medical record number: [id number] Date of birth: 1925-05-03 Age: 79 y.o. Gender: female  I saw and evaluated the patient on a.m. rounds with house staff.   I reviewed the resident's note by Dr. Zada Girt and I agree with the resident's findings and plans as documented in his note, with the following additional comments.  Patient was febrile to 101.5 overnight and reports some abdominal discomfort today.  She has mild lower abdominal tenderness on exam.  We repeated a CT scan of the abdomen and pelvis today, which showed a mild increase in the size of the diverticular abscess in the central sigmoid mesocolon but no other significant change compared to the prior exam.  We will await surgery input following the CT scan regarding possible need for drainage.  She was put back on a clear liquid diet today.    She was transitioned yesterday to oral antibiotics; if her fever recurs or her clinical status deteriorates at all, would put her back on IV antibiotics.

## 2012-01-18 NOTE — Progress Notes (Signed)
Subjective: She feels about the same from yesterday. She reports that she has a disturbing cough which cause her abdomen to hurt. She has not had a bowel movement yet. She spiked a high temp of 101 last night. She is otherwise tolerating her diet well.   Objective: Vital signs in last 24 hours: Filed Vitals:   01/17/12 2330 01/18/12 0336 01/18/12 0525 01/18/12 1446  BP:  166/74 138/58 142/56  Pulse:  80 74 51  Temp: 100.2 F (37.9 C) 101.5 F (38.6 C) 100.9 F (38.3 C) 97.4 F (36.3 C)  TempSrc:  Oral Oral Oral  Resp:  28 24 22   Height:      Weight:      SpO2:  96% 98% 96%   Weight change:   General appearance: alert, cooperative, fatigued and no distress Lungs: clear to auscultation bilaterally Heart: regular rate and rhythm, S1, S2 normal, no murmur, click, rub or gallop Abdomen: soft, non-tender; bowel sounds normal; no masses,  no organomegaly Extremities: extremities normal, atraumatic, no cyanosis or edema Pulses: 2+ and symmetric Neurologic: Grossly normal Lab Results: Basic Metabolic Panel:  Lab 01/18/12 4098 01/17/12 0610  NA 132* 139  K 3.3* 3.5  CL 98 103  CO2 26 27  GLUCOSE 138* 115*  BUN 4* 4*  CREATININE 0.66 0.69  CALCIUM 8.1* 8.5  MG -- --  PHOS -- --   Liver Function Tests:  Lab 01/14/12 1856 01/14/12 1330  AST 19 14  ALT 11 13  ALKPHOS 60 61  BILITOT 1.0 1.4*  PROT 7.2 6.8  ALBUMIN 3.1* 3.8   CBC:  Lab 01/18/12 0515 01/17/12 0610 01/14/12 1856  WBC 5.1 5.0 --  NEUTROABS -- -- 10.5*  HGB 9.9* 10.7* --  HCT 29.6* 29.6* --  MCV 89.2 93.4 --  PLT 302 325 --   Coagulation:  Lab 01/14/12 2332  LABPROT 16.6*  INR 1.38   Urine Drug Screen: Drugs of Abuse     Component Value Date/Time   LABOPIA NONE DETECTED 01/03/2007 0006   COCAINSCRNUR NONE DETECTED 01/03/2007 0006   LABBENZ NONE DETECTED 01/03/2007 0006   AMPHETMU NONE DETECTED 01/03/2007 0006   THCU NONE DETECTED 01/03/2007 0006   LABBARB  Value: NONE DETECTED        DRUG SCREEN FOR  MEDICAL PURPOSES ONLY.  IF CONFIRMATION IS NEEDED FOR ANY PURPOSE, NOTIFY LAB WITHIN 5 DAYS.        LOWEST DETECTABLE LIMITS FOR URINE DRUG SCREEN Drug Class       Cutoff (ng/mL) Amphetamine      1000 Barbiturate      200 Benzodiazepine   200 Cocaine          300 Opiates          300 THC              50 01/03/2007 0006     Urinalysis:  Lab 01/17/12 2339 01/14/12 1246  COLORURINE AMBER* --  LABSPEC 1.013 --  PHURINE 5.5 --  GLUCOSEU NEGATIVE --  HGBUR NEGATIVE --  BILIRUBINUR NEGATIVE neg  KETONESUR NEGATIVE --  PROTEINUR NEGATIVE --  UROBILINOGEN 0.2 0.2  NITRITE NEGATIVE neg  LEUKOCYTESUR SMALL* Negative    Micro Results: Recent Results (from the past 240 hour(s))  URINE CULTURE     Status: Normal   Collection Time   01/14/12 12:44 PM      Component Value Range Status Comment   Colony Count NO GROWTH   Final    Organism ID,  Bacteria NO GROWTH   Final   CULTURE, BLOOD (ROUTINE X 2)     Status: Normal (Preliminary result)   Collection Time   01/14/12 11:30 PM      Component Value Range Status Comment   Specimen Description BLOOD ARM RIGHT   Final    Special Requests BOTTLES DRAWN AEROBIC AND ANAEROBIC 10CC   Final    Culture  Setup Time 01/15/2012 07:53   Final    Culture     Final    Value:        BLOOD CULTURE RECEIVED NO GROWTH TO DATE CULTURE WILL BE HELD FOR 5 DAYS BEFORE ISSUING A FINAL NEGATIVE REPORT   Report Status PENDING   Incomplete   CULTURE, BLOOD (ROUTINE X 2)     Status: Normal (Preliminary result)   Collection Time   01/14/12 11:35 PM      Component Value Range Status Comment   Specimen Description BLOOD HAND RIGHT   Final    Special Requests BOTTLES DRAWN AEROBIC ONLY 10CC   Final    Culture  Setup Time 01/15/2012 07:54   Final    Culture     Final    Value:        BLOOD CULTURE RECEIVED NO GROWTH TO DATE CULTURE WILL BE HELD FOR 5 DAYS BEFORE ISSUING A FINAL NEGATIVE REPORT   Report Status PENDING   Incomplete    Studies/Results: Dg Chest 2  View  01/17/2012  *RADIOLOGY REPORT*  Clinical Data: Cough, short of breath, wheezing  CHEST - 2 VIEW  Comparison: Chest x-ray of 01/14/2012  Findings: The lungs are clear and slightly hyperaerated.  Somewhat prominent perihilar markings may be due to bronchitis. Cardiomegaly is stable.  There are degenerative changes throughout the thoracic spine. Calcifications in the upper chest - lower neck probably represent calcified thyroid nodules.  IMPRESSION: No pneumonia.  Cannot exclude bronchitis.   Original Report Authenticated By: Dwyane Dee, M.D.    Ct Abdomen Pelvis W Contrast  01/18/2012  *RADIOLOGY REPORT*  Clinical Data: Increased abdominal pain and tenderness.  Fever. Diverticulitis with diverticular abscess.  CT ABDOMEN AND PELVIS WITH CONTRAST  Technique:  Multidetector CT imaging of the abdomen and pelvis was performed following the standard protocol during bolus administration of intravenous contrast.  Contrast: OMNIPAQUE IOHEXOL 300 MG/ML  SOLN  Comparison: 01/14/2012  Findings: The sigmoid diverticulitis shows no significant change compared to previous exam.  An extraluminal fluid collection in the central sigmoid mesentery is mildly increased in size, currently measuring 3.0 x 3.3 cm compared with 2.3 x 2.4 cm previously.  This is consistent with diverticular abscess.  There are also other extraluminal gas bubbles seen in the sigmoid mesentery, however there is no evidence of free intraperitoneal air.  A tiny amount of free fluid is also noted in the pelvic cul-de-sac.  Previous hysterectomy noted.  Adnexal regions are otherwise unremarkable in appearance.  There is no evidence of hydronephrosis.  Tiny bilateral renal cysts are again noted.  The adrenal glands, pancreas, spleen, and liver are normal in appearance.  Gallbladder is unremarkable.  No soft tissue masses or lymphadenopathy identified.  IMPRESSION:  Mild increase in size of diverticular abscess in the central sigmoid mesocolon  measuring 3.3 cm.  No other significant change compared to prior exam.   Original Report Authenticated By: Myles Rosenthal, M.D.    Medications: I have reviewed the patient's current medications. Scheduled Meds:    . levofloxacin  500 mg Oral Daily  .  metroNIDAZOLE  500 mg Oral Q8H   Continuous Infusions:    . dextrose 5 % and 0.45% NaCl 125 mL/hr at 01/18/12 0333   PRN Meds:.albuterol, albuterol-ipratropium, chlorpheniramine-HYDROcodone, morphine injection, ondansetron (ZOFRAN) IV, ondansetron Assessment/Plan: Ms. Alcindor is an 79yo woman who presented to the [facility] ED for evaluation of abdominal pain which was confirmed on imaging to be caused by diverticulitis.  Diverticulitis with perforation, abscess formation, and colonic pneumatosis: Ms Lave did not have a history of diverticulitis; but her history, exam findings, and radiographic findings were consistent with diverticulitis. There was evidence of pneumatosis in the adjacent colonic wall and a pericolonic abscess (2.4 x 2.3 cm). She was admitted to a med-surg bed and consulted surgery who recommended etting medical therapy. Repeat CT scan on 01/18/2012 after 5 days of medical treatment revealed a mild increase in size of diverticular abscess in the central sigmoid mesocolon measuring 3.3 cm. She responded very well to ciprofloxacin and metronidazole. Repeat examination continued to show less tenderness. On 1/17 AM she had a high a fever of 101. No leukocytosis with her WBC remaining at 5.1.   Plan - changed her back from low fiber diet to to clear diet since there is a feeling we could have advanced her too soon.  - changed to oral Levaquin on and flagyl. This is day 5 of abx therapy -Repeat CT scan of abdomen reveals mild increase in size of diverticular abscess in the central sigmoid mesocolon measuring 3.3 cm - appreciate Surgery input.  - will keep her on iv fluids as we monitor clinical status  - Morphine 2 mg every 2 hours as needed for  pain   Microscopic hematuria: Urinalysis demonstrates moderate hemoglobin, positive nitrites, 3-6 WBCs, and 10-15 RBCs. Her symptoms of dysuria are concerning for cystitis, but this will be covered with antibiotics used to treat her diverticulitis. These findings can also be explained by the appositional pericolonic inflammation. She will need a followup urinalysis in the outpatient setting, following resolution of the intra-abdominal inflammation. Repeat urinalysis with microscopy revealed only 0-2 RBCs per HPF.  - Treat diverticulitis as above   Community acquired pneumonia: Symptoms had resolved on presentation to ED with diverticulitis however, she started having coughs on 1/16. Her lung exam was normal . A chest x ray was normal. resolved.  Plan  - will do Levaquin in place of oral cipro for treatment of any respiratory tract infection if present  COPD: Stable. At home she uses albuterol and albuterol/ipratropium as needed for wheezing or shortness of breath. We'll continue these here.  - Albuterol and albuterol/ipratropium every 6 hours as needed   Hypertension: At home, she takes lisinopril 10 mg daily. Over the past couple months, her blood pressures have ranged from 160-100/80-60. On average it seems to be running 135/75. Current BP is normal at 126/54. Plan  - Hold lisinopril    Hyponatremia: Sodium on admission was 133. Other electrolytes were normal. We will continue to monitor this and hydrate with  D5 half normal Plan  Monitor BMET  Incidental pulmonary and adrenal nodules: 1.3 x 4.4 cm irregular peripheral pleural based parenchymal opacity in the right lower lobe on computed tomography. There is also a 1. 6 x 2.0 cm left adrenal nodule. The radiologist recommends followup imaging in 12 months for the adrenal nodule. According to the Fleischner Society recommendations, this pulmonary nodule will require imaging followup with a contrasted CT in 3 months. We will defer these for  further evaluation as outpatient.  Prophylaxis: We will hold off on pharmacologic VTE until surgery gives [place] the recommendations. We will use SCDs for now.   Disposition: Patient's PCP is Dr. Windle Guard (unassigned), and she will not require OPC followup. Expected length of stay is greater than 2 days.     LOS: 4 days   Dow Adolph 01/18/2012, 4:40 PM

## 2012-01-18 NOTE — Progress Notes (Signed)
Patient ID: [name], female   DOB: [birth year], 79 y.o.   MRN: [id number]    Subjective: Pt feels a little worse today.  Slight worsening of abdominal tenderness.  Objective: Vital signs in last 24 hours: Temp:  [98.5 F (36.9 C)-101.5 F (38.6 C)] 100.9 F (38.3 C) (01/17 0525) Pulse Rate:  [68-80] 74  (01/17 0525) Resp:  [18-28] 24  (01/17 0525) BP: (133-166)/(58-76) 138/58 mmHg (01/17 0525) SpO2:  [90 %-98 %] 98 % (01/17 0525) Last BM Date: 01/14/12  Intake/Output from previous day: 01/16 0701 - 01/17 0700 In: 2889 [P.O.:460; I.V.:2429] Out: 750 [Urine:750] Intake/Output this shift:    PE: Abd: soft, not much change in tenderness, +BS, ND  Lab Results:   [facility] 01/18/12 0515 01/17/12 0610  WBC 5.1 5.0  HGB 9.9* 10.7*  HCT 29.6* 29.6*  PLT 302 325   BMET  Basename 01/18/12 0515 01/17/12 0610  NA 132* 139  K 3.3* 3.5  CL 98 103  CO2 26 27  GLUCOSE 138* 115*  BUN 4* 4*  CREATININE 0.66 0.69  CALCIUM 8.1* 8.5   PT/INR No results found for this basename: LABPROT:2,INR:2 in the last 72 hours CMP     Component Value Date/Time   NA 132* 01/18/2012 0515   K 3.3* 01/18/2012 0515   CL 98 01/18/2012 0515   CO2 26 01/18/2012 0515   GLUCOSE 138* 01/18/2012 0515   BUN 4* 01/18/2012 0515   CREATININE 0.66 01/18/2012 0515   CREATININE 0.70 01/14/2012 1330   CALCIUM 8.1* 01/18/2012 0515   PROT 7.2 01/14/2012 1856   ALBUMIN 3.1* 01/14/2012 1856   AST 19 01/14/2012 1856   ALT 11 01/14/2012 1856   ALKPHOS 60 01/14/2012 1856   BILITOT 1.0 01/14/2012 1856   GFRNONAA 79* 01/18/2012 0515   GFRAA >90 01/18/2012 0515   Lipase  No results found for this basename: lipase       Studies/Results: Dg Chest 2 View  01/17/2012  *RADIOLOGY REPORT*  Clinical Data: Cough, short of breath, wheezing  CHEST - 2 VIEW  Comparison: Chest x-ray of 01/14/2012  Findings: The lungs are clear and slightly hyperaerated.  Somewhat prominent perihilar markings may be due to bronchitis. Cardiomegaly  is stable.  There are degenerative changes throughout the thoracic spine. Calcifications in the upper chest - lower neck probably represent calcified thyroid nodules.  IMPRESSION: No pneumonia.  Cannot exclude bronchitis.   Original Report Authenticated By: Dwyane Dee, M.D.     Anti-infectives: Anti-infectives     Start     Dose/Rate Route Frequency Ordered Stop   01/18/12 0600   levofloxacin (LEVAQUIN) tablet 500 mg        500 mg Oral Daily 01/17/12 1518     01/17/12 1800   metroNIDAZOLE (FLAGYL) tablet 500 mg        500 mg Oral 3 times per day 01/17/12 1518     01/15/12 0200   ciprofloxacin (CIPRO) IVPB 400 mg  Status:  Discontinued        400 mg 200 mL/hr over 60 Minutes Intravenous Every 12 hours 01/15/12 0006 01/17/12 1518   01/15/12 0200   metroNIDAZOLE (FLAGYL) IVPB 500 mg  Status:  Discontinued        500 mg 100 mL/hr over 60 Minutes Intravenous Every 8 hours 01/15/12 0006 01/17/12 1518   01/14/12 2245   ertapenem (INVANZ) 1 g in sodium chloride 0.9 % 50 mL IVPB        1 g 100 mL/hr  over 30 Minutes Intravenous  Once 01/14/12 2232 01/15/12 0018           Assessment/Plan  1. Diverticulitis with microperforation  Plan: 1. Patient spiked a fever over night of 101.5.  She has a slight worsening in her pain today.  She got rapidly advanced yesterday from sips of clears to a solid diet.  She may need to be backed off as this may have been too quick for her.  If she continues to have fevers, even though her WBC did not go up , she may require a repeat CT scan to rule out the formation of an abscess.  Will follow.   LOS: 4 days    Jalana Moore E 01/18/2012, 8:57 AM Pager: [phone]

## 2012-01-18 NOTE — Progress Notes (Signed)
0400 T101.5 P 80 R 28 B/P 166/79 Dr. Earlene Plater notified of elevated temp. Will continue to monitor.

## 2012-01-19 DIAGNOSIS — K630 Abscess of intestine: Secondary | ICD-10-CM

## 2012-01-19 LAB — BASIC METABOLIC PANEL
BUN: 3 mg/dL — ABNORMAL LOW (ref 6–23)
Chloride: 97 mEq/L (ref 96–112)
GFR calc Af Amer: >90 mL/min (ref >90)
GFR calc non Af Amer: 79 mL/min — ABNORMAL LOW (ref >90)
Glucose, Bld: 114 mg/dL — ABNORMAL HIGH (ref 70–99)
Potassium: 3.2 mEq/L — ABNORMAL LOW (ref 3.5–5.1)
Sodium: 133 mEq/L — ABNORMAL LOW (ref 135–145)

## 2012-01-19 LAB — CBC
HCT: 30.1 % — ABNORMAL LOW (ref 36.0–46.0)
Hemoglobin: 10.4 g/dL — ABNORMAL LOW (ref 12.0–15.0)
MCHC: 34.6 g/dL (ref 30.0–36.0)
RDW: 12.6 % (ref 11.5–15.5)
WBC: 4.3 10*3/uL (ref 4.0–10.5)

## 2012-01-19 MED ORDER — LEVOFLOXACIN IN D5W 500 MG/100ML IV SOLN
500.0000 mg | INTRAVENOUS | Status: DC
  Administered 2012-01-20 – 2012-01-22 (×3): 500 mg via INTRAVENOUS
  Filled 2012-01-19 (×3): qty 100

## 2012-01-19 MED ORDER — METRONIDAZOLE IN NACL 5-0.79 MG/ML-% IV SOLN
500.0000 mg | Freq: Three times a day (TID) | INTRAVENOUS | Status: DC
  Administered 2012-01-19 – 2012-01-22 (×9): 500 mg via INTRAVENOUS
  Filled 2012-01-19 (×12): qty 100

## 2012-01-19 MED ORDER — MOMETASONE FURO-FORMOTEROL FUM 100-5 MCG/ACT IN AERO
2.0000 | INHALATION_SPRAY | Freq: Two times a day (BID) | RESPIRATORY_TRACT | Status: DC
  Administered 2012-01-19 – 2012-01-22 (×5): 2 via RESPIRATORY_TRACT
  Filled 2012-01-19: qty 8.8

## 2012-01-19 MED ORDER — POTASSIUM CHLORIDE 10 MEQ/100ML IV SOLN
10.0000 meq | INTRAVENOUS | Status: AC
  Administered 2012-01-19 (×4): 10 meq via INTRAVENOUS
  Filled 2012-01-19 (×2): qty 200

## 2012-01-19 NOTE — Progress Notes (Addendum)
Diverticulitis of colon with perforation  Assessment: Diverticulitis of colon with perforation Diverticulitis with small abscess, apparently confined to mesentery  Plan: I reviewed yesterdays CT with the I. Radiologist. Currently the abscess is confined and has only a small amount of fluid. At this time it is not accessible for Perc drain, although if it enlarges, it might become accesible  Since her abd is relatively benign, and wbc is wnl would continue antibiotic management a few more days before deciding to operate. Since she is still having fever, consider switch back to IV Invanz.Would not allow more than clear liquids. Abscesses of this size often resolve with IV antibiotic   Subjective: Says she feels better, no nausea, would like to have something PO  Objective: Vital signs in last 24 hours: Temp:  [97.4 F (36.3 C)-99.7 F (37.6 C)] 98.1 F (36.7 C) (01/18 0647) Pulse Rate:  [51-81] 79  (01/18 0647) Resp:  [18-22] 18  (01/18 0647) BP: (142-144)/(56-68) 144/68 mmHg (01/18 0647) SpO2:  [91 %-98 %] 98 % (01/18 0647) Weight:  [161 lb 6 oz (73.2 kg)] 161 lb 6 oz (73.2 kg) (01/18 0647) Last BM Date: 01/18/12  Intake/Output from previous day: 01/17 0701 - 01/18 0700 In: 3183.3 [I.V.:3183.3] Out: 251 [Urine:250; Stool:1] Intake/Output this shift:    General appearance: alert, cooperative and no distress GI: Abd is soft, and essentially not tender, BS+  Lab Results:  Results for orders placed during the hospital encounter of 01/14/12 (from the past 24 hour(s))  CBC     Status: Abnormal   Collection Time   01/19/12  7:10 AM      Component Value Range   WBC 4.3  4.0 - 10.5 K/uL   RBC 3.34 (*) 3.87 - 5.11 MIL/uL   Hemoglobin 10.4 (*) 12.0 - 15.0 g/dL   HCT 81.1 (*) 91.4 - 78.2 %   MCV 90.1  78.0 - 100.0 fL   MCH 31.1  26.0 - 34.0 pg   MCHC 34.6  30.0 - 36.0 g/dL   RDW 95.6  21.3 - 08.6 %   Platelets 289  150 - 400 K/uL     Studies/Results Radiology     MEDS,  Scheduled    . levofloxacin  500 mg Oral Daily  . metroNIDAZOLE  500 mg Oral Q8H       LOS: 5 days    Currie Paris, MD, [facility] Surgery, [place] [phone]   01/19/2012 8:48 AM

## 2012-01-19 NOTE — Progress Notes (Signed)
Subjective: She complains of cough and she believes it makes her tummy hurt. She is afebrile at this time.   Objective: Vital signs in last 24 hours: Filed Vitals:   01/18/12 0525 01/18/12 1446 01/18/12 2157 01/19/12 0647  BP: 138/58 142/56 144/58 144/68  Pulse: 74 51 81 79  Temp: 100.9 F (38.3 C) 97.4 F (36.3 C) 99.7 F (37.6 C) 98.1 F (36.7 C)  TempSrc: Oral Oral Oral Oral  Resp: 24 22 20 18   Height:      Weight:    161 lb 6 oz (73.2 kg)  SpO2: 98% 96% 91% 98%   Weight change:   General appearance: alert, cooperative, fatigued and no distress Lungs: clear to auscultation bilaterally Heart: regular rate and rhythm, S1, S2 normal, no murmur, click, rub or gallop Abdomen: soft, non-tender; bowel sounds normal; no masses,  no organomegaly Extremities: extremities normal, atraumatic, no cyanosis or edema Pulses: 2+ and symmetric Neurologic: Grossly normal Lab Results: Basic Metabolic Panel:  Lab 01/19/12 1610 01/18/12 0515  NA 133* 132*  K 3.2* 3.3*  CL 97 98  CO2 26 26  GLUCOSE 114* 138*  BUN 3* 4*  CREATININE 0.66 0.66  CALCIUM 8.4 8.1*  MG -- --  PHOS -- --   Liver Function Tests:  Lab 01/14/12 1856 01/14/12 1330  AST 19 14  ALT 11 13  ALKPHOS 60 61  BILITOT 1.0 1.4*  PROT 7.2 6.8  ALBUMIN 3.1* 3.8   CBC:  Lab 01/19/12 0710 01/18/12 0515 01/14/12 1856  WBC 4.3 5.1 --  NEUTROABS -- -- 10.5*  HGB 10.4* 9.9* --  HCT 30.1* 29.6* --  MCV 90.1 89.2 --  PLT 289 302 --   Coagulation:  Lab 01/14/12 2332  LABPROT 16.6*  INR 1.38   Urine Drug Screen: Drugs of Abuse     Component Value Date/Time   LABOPIA NONE DETECTED 01/03/2007 0006   COCAINSCRNUR NONE DETECTED 01/03/2007 0006   LABBENZ NONE DETECTED 01/03/2007 0006   AMPHETMU NONE DETECTED 01/03/2007 0006   THCU NONE DETECTED 01/03/2007 0006   LABBARB  Value: NONE DETECTED        DRUG SCREEN FOR MEDICAL PURPOSES ONLY.  IF CONFIRMATION IS NEEDED FOR ANY PURPOSE, NOTIFY LAB WITHIN 5 DAYS.        LOWEST  DETECTABLE LIMITS FOR URINE DRUG SCREEN Drug Class       Cutoff (ng/mL) Amphetamine      1000 Barbiturate      200 Benzodiazepine   200 Cocaine          300 Opiates          300 THC              50 01/03/2007 0006     Urinalysis:  Lab 01/17/12 2339 01/14/12 1246  COLORURINE AMBER* --  LABSPEC 1.013 --  PHURINE 5.5 --  GLUCOSEU NEGATIVE --  HGBUR NEGATIVE --  BILIRUBINUR NEGATIVE neg  KETONESUR NEGATIVE --  PROTEINUR NEGATIVE --  UROBILINOGEN 0.2 0.2  NITRITE NEGATIVE neg  LEUKOCYTESUR SMALL* Negative    Micro Results: Recent Results (from the past 240 hour(s))  URINE CULTURE     Status: Normal   Collection Time   01/14/12 12:44 PM      Component Value Range Status Comment   Colony Count NO GROWTH   Final    Organism ID, Bacteria NO GROWTH   Final   CULTURE, BLOOD (ROUTINE X 2)     Status: Normal (Preliminary result)  Collection Time   01/14/12 11:30 PM      Component Value Range Status Comment   Specimen Description BLOOD ARM RIGHT   Final    Special Requests BOTTLES DRAWN AEROBIC AND ANAEROBIC 10CC   Final    Culture  Setup Time 01/15/2012 07:53   Final    Culture     Final    Value:        BLOOD CULTURE RECEIVED NO GROWTH TO DATE CULTURE WILL BE HELD FOR 5 DAYS BEFORE ISSUING A FINAL NEGATIVE REPORT   Report Status PENDING   Incomplete   CULTURE, BLOOD (ROUTINE X 2)     Status: Normal (Preliminary result)   Collection Time   01/14/12 11:35 PM      Component Value Range Status Comment   Specimen Description BLOOD HAND RIGHT   Final    Special Requests BOTTLES DRAWN AEROBIC ONLY 10CC   Final    Culture  Setup Time 01/15/2012 07:54   Final    Culture     Final    Value:        BLOOD CULTURE RECEIVED NO GROWTH TO DATE CULTURE WILL BE HELD FOR 5 DAYS BEFORE ISSUING A FINAL NEGATIVE REPORT   Report Status PENDING   Incomplete    Studies/Results: Ct Abdomen Pelvis W Contrast  01/18/2012  *RADIOLOGY REPORT*  Clinical Data: Increased abdominal pain and tenderness.  Fever.  Diverticulitis with diverticular abscess.  CT ABDOMEN AND PELVIS WITH CONTRAST  Technique:  Multidetector CT imaging of the abdomen and pelvis was performed following the standard protocol during bolus administration of intravenous contrast.  Contrast: OMNIPAQUE IOHEXOL 300 MG/ML  SOLN  Comparison: 01/14/2012  Findings: The sigmoid diverticulitis shows no significant change compared to previous exam.  An extraluminal fluid collection in the central sigmoid mesentery is mildly increased in size, currently measuring 3.0 x 3.3 cm compared with 2.3 x 2.4 cm previously.  This is consistent with diverticular abscess.  There are also other extraluminal gas bubbles seen in the sigmoid mesentery, however there is no evidence of free intraperitoneal air.  A tiny amount of free fluid is also noted in the pelvic cul-de-sac.  Previous hysterectomy noted.  Adnexal regions are otherwise unremarkable in appearance.  There is no evidence of hydronephrosis.  Tiny bilateral renal cysts are again noted.  The adrenal glands, pancreas, spleen, and liver are normal in appearance.  Gallbladder is unremarkable.  No soft tissue masses or lymphadenopathy identified.  IMPRESSION:  Mild increase in size of diverticular abscess in the central sigmoid mesocolon measuring 3.3 cm.  No other significant change compared to prior exam.   Original Report Authenticated By: Myles Rosenthal, M.D.    Medications: I have reviewed the patient's current medications. Scheduled Meds:    . levofloxacin  500 mg Intravenous Q24H  . metroNIDAZOLE  500 mg Intravenous Q8H  . mometasone-formoterol  2 puff Inhalation BID  . potassium chloride  10 mEq Intravenous Q1 Hr x 4   Continuous Infusions:    . dextrose 5 % and 0.45% NaCl 125 mL/hr (01/19/12 0427)   PRN Meds:.albuterol, chlorpheniramine-HYDROcodone, morphine injection, ondansetron (ZOFRAN) IV, ondansetron Assessment/Plan: [name] is an 79yo woman who presented to the [facility] ED for evaluation of  abdominal pain which was confirmed on imaging to be caused by diverticulitis.  Diverticulitis with perforation, abscess formation, and colonic pneumatosis: [name] did not have a history of diverticulitis; but her history, exam findings, and radiographic findings were consistent with diverticulitis. There  was evidence of pneumatosis in the adjacent colonic wall and a pericolonic abscess (2.4 x 2.3 cm). She was admitted to a med-surg bed and consulted surgery who recommended etting medical therapy. Repeat CT scan on 01/18/2012 after 5 days of medical treatment revealed a mild increase in size of diverticular abscess in the central sigmoid mesocolon measuring 3.3 cm. She responded very well to ciprofloxacin and metronidazole. Repeat examination continued to show less tenderness. On 1/17 AM she had a high a fever of 101. No leukocytosis with her WBC remaining at 5.1.   Plan - changed her back from low fiber diet to to clear diet since there is a feeling we could have advanced her too soon.  - changed back to IV antibiotics today as recommended by surgery. This is day 6 of abx therapy - appreciate surgery in put - will keep her on iv fluids as we monitor clinical status  - Morphine 2 mg every 2 hours as needed for pain   Microscopic hematuria: Urinalysis demonstrates moderate hemoglobin, positive nitrites, 3-6 WBCs, and 10-15 RBCs. Her symptoms of dysuria are concerning for cystitis, but this will be covered with antibiotics used to treat her diverticulitis. These findings can also be explained by the appositional pericolonic inflammation. She will need a followup urinalysis in the outpatient setting, following resolution of the intra-abdominal inflammation. Repeat urinalysis with microscopy revealed only 0-2 RBCs per HPF.  - Treat diverticulitis as above   Community acquired pneumonia: Symptoms had resolved on presentation to ED with diverticulitis however, she started having coughs on 1/16. Her lung exam  was normal . A chest x ray was normal. resolved.  Plan  - will continue with Levaquin  COPD: Stable. At home she uses albuterol and albuterol/ipratropium as needed for wheezing or shortness of breath. We'll continue these here. She sounded wheezy today more than usual.  Plan - changed her from Albuterol and albuterol/ipratropium every 6 hours as needed to [facility] bid  Hypertension: At home, she takes lisinopril 10 mg daily. Over the past couple months, her blood pressures have ranged from 160-100/80-60. On average it seems to be running 135/75. Current BP is normal at 126/54. Plan  - Hold lisinopril    Hyponatremia: Sodium on admission was 133. Other electrolytes were normal. We will continue to monitor this and hydrate with  D5 half normal Plan  Monitor BMET  Hypokalemia: Today's BMET shows K level of 3.2. Etiology is most likely her low intake with IV fluids with D5%.  Plan - We will give 3 rounds of KCL push  - will monitor  K daily  - check Mg   Incidental pulmonary and adrenal nodules: 1.3 x 4.4 cm irregular peripheral pleural based parenchymal opacity in the right lower lobe on computed tomography. There is also a 1. 6 x 2.0 cm left adrenal nodule. The radiologist recommends followup imaging in 12 months for the adrenal nodule. According to the Fleischner Society recommendations, this pulmonary nodule will require imaging followup with a contrasted CT in 3 months. We will defer these for further evaluation as outpatient.   Prophylaxis: We will hold off on pharmacologic VTE until surgery gives [place] the recommendations. We will use SCDs for now.   Disposition: Patient's PCP is Dr. Windle Guard (unassigned), and she will not require OPC followup. Expected length of stay is greater than 2 days.     LOS: 5 days   Dow Adolph 01/19/2012, 12:32 PM

## 2012-01-20 LAB — BASIC METABOLIC PANEL
Calcium: 8.1 mg/dL — ABNORMAL LOW (ref 8.4–10.5)
Creatinine, Ser: 0.62 mg/dL (ref 0.50–1.10)
GFR calc non Af Amer: 81 mL/min — ABNORMAL LOW (ref >90)
Glucose, Bld: 102 mg/dL — ABNORMAL HIGH (ref 70–99)
Sodium: 135 mEq/L (ref 135–145)

## 2012-01-20 LAB — CBC
Hemoglobin: 9.1 g/dL — ABNORMAL LOW (ref 12.0–15.0)
MCH: 29.7 pg (ref 26.0–34.0)
MCHC: 33.2 g/dL (ref 30.0–36.0)
MCV: 89.5 fL (ref 78.0–100.0)
Platelets: 282 10*3/uL (ref 150–400)

## 2012-01-20 MED ORDER — POTASSIUM CHLORIDE CRYS ER 20 MEQ PO TBCR
40.0000 meq | EXTENDED_RELEASE_TABLET | Freq: Once | ORAL | Status: AC
  Administered 2012-01-20: 40 meq via ORAL
  Filled 2012-01-20: qty 2

## 2012-01-20 NOTE — Progress Notes (Signed)
Patient ID: [name], female   DOB: 26-Sep-1925, 79 y.o.   MRN: [id number]    Subjective: Pt feels well today.  Wants more to eat than clear liquids.  No abdominal pain  Objective: Vital signs in last 24 hours: Temp:  [97.5 F (36.4 C)-99.5 F (37.5 C)] 98.8 F (37.1 C) (01/19 0504) Pulse Rate:  [69-76] 72  (01/19 0504) Resp:  [18-20] 18  (01/19 0504) BP: (118-139)/(52-65) 118/52 mmHg (01/19 0504) SpO2:  [91 %-100 %] 95 % (01/19 0823) Weight:  [165 lb 9.1 oz (75.1 kg)] 165 lb 9.1 oz (75.1 kg) (01/19 0504) Last BM Date: 01/19/12  Intake/Output from previous day: 01/18 0701 - 01/19 0700 In: 3518.8 [P.O.:630; I.V.:2888.8] Out: 825 [Urine:825] Intake/Output this shift:    PE: Abd: soft, Nt, ND, +BS  Lab Results:   [facility] 01/20/12 0610 01/19/12 0710  WBC 4.0 4.3  HGB 9.1* 10.4*  HCT 27.4* 30.1*  PLT 282 289   BMET  Basename 01/20/12 0610 01/19/12 0710  NA 135 133*  K 3.4* 3.2*  CL 102 97  CO2 26 26  GLUCOSE 102* 114*  BUN 3* 3*  CREATININE 0.62 0.66  CALCIUM 8.1* 8.4   PT/INR No results found for this basename: LABPROT:2,INR:2 in the last 72 hours CMP     Component Value Date/Time   NA 135 01/20/2012 0610   K 3.4* 01/20/2012 0610   CL 102 01/20/2012 0610   CO2 26 01/20/2012 0610   GLUCOSE 102* 01/20/2012 0610   BUN 3* 01/20/2012 0610   CREATININE 0.62 01/20/2012 0610   CREATININE 0.70 01/14/2012 1330   CALCIUM 8.1* 01/20/2012 0610   PROT 7.2 01/14/2012 1856   ALBUMIN 3.1* 01/14/2012 1856   AST 19 01/14/2012 1856   ALT 11 01/14/2012 1856   ALKPHOS 60 01/14/2012 1856   BILITOT 1.0 01/14/2012 1856   GFRNONAA 81* 01/20/2012 0610   GFRAA >90 01/20/2012 0610   Lipase  No results found for this basename: lipase       Studies/Results: Ct Abdomen Pelvis W Contrast  01/18/2012  *RADIOLOGY REPORT*  Clinical Data: Increased abdominal pain and tenderness.  Fever. Diverticulitis with diverticular abscess.  CT ABDOMEN AND PELVIS WITH CONTRAST  Technique:  Multidetector  CT imaging of the abdomen and pelvis was performed following the standard protocol during bolus administration of intravenous contrast.  Contrast: OMNIPAQUE IOHEXOL 300 MG/ML  SOLN  Comparison: 01/14/2012  Findings: The sigmoid diverticulitis shows no significant change compared to previous exam.  An extraluminal fluid collection in the central sigmoid mesentery is mildly increased in size, currently measuring 3.0 x 3.3 cm compared with 2.3 x 2.4 cm previously.  This is consistent with diverticular abscess.  There are also other extraluminal gas bubbles seen in the sigmoid mesentery, however there is no evidence of free intraperitoneal air.  A tiny amount of free fluid is also noted in the pelvic cul-de-sac.  Previous hysterectomy noted.  Adnexal regions are otherwise unremarkable in appearance.  There is no evidence of hydronephrosis.  Tiny bilateral renal cysts are again noted.  The adrenal glands, pancreas, spleen, and liver are normal in appearance.  Gallbladder is unremarkable.  No soft tissue masses or lymphadenopathy identified.  IMPRESSION:  Mild increase in size of diverticular abscess in the central sigmoid mesocolon measuring 3.3 cm.  No other significant change compared to prior exam.   Original Report Authenticated By: Myles Rosenthal, M.D.     Anti-infectives: Anti-infectives     Start  Dose/Rate Route Frequency Ordered Stop   01/20/12 0900   levofloxacin (LEVAQUIN) IVPB 500 mg        500 mg 100 mL/hr over 60 Minutes Intravenous Every 24 hours 01/19/12 1209     01/19/12 1430   metroNIDAZOLE (FLAGYL) IVPB 500 mg        500 mg 100 mL/hr over 60 Minutes Intravenous Every 8 hours 01/19/12 1209     01/18/12 0600   levofloxacin (LEVAQUIN) tablet 500 mg  Status:  Discontinued        500 mg Oral Daily 01/17/12 1518 01/19/12 1209   01/17/12 1800   metroNIDAZOLE (FLAGYL) tablet 500 mg  Status:  Discontinued        500 mg Oral 3 times per day 01/17/12 1518 01/19/12 1209   01/15/12 0200    ciprofloxacin (CIPRO) IVPB 400 mg  Status:  Discontinued        400 mg 200 mL/hr over 60 Minutes Intravenous Every 12 hours 01/15/12 0006 01/17/12 1518   01/15/12 0200   metroNIDAZOLE (FLAGYL) IVPB 500 mg  Status:  Discontinued        500 mg 100 mL/hr over 60 Minutes Intravenous Every 8 hours 01/15/12 0006 01/17/12 1518   01/14/12 2245   ertapenem (INVANZ) 1 g in sodium chloride 0.9 % 50 mL IVPB        1 g 100 mL/hr over 30 Minutes Intravenous  Once 01/14/12 2232 01/15/12 0018           Assessment/Plan  1. Diverticulitis with small abscess, undrainable  Plan: 1. Will advance to full liquids today. 2. Cont abx therapy   LOS: 6 days    Josealfredo Adkins E 01/20/2012, 9:34 AM Pager: [phone]

## 2012-01-20 NOTE — Progress Notes (Signed)
Agree with A&P of KO,PA Patient clinically improving, OK to slowly advance diet.

## 2012-01-20 NOTE — Progress Notes (Signed)
Subjective: Pt reports she still experiencing a cough, but that the Tussionex improved her cough symptoms.  She endorses some very mild nausea, but denies any episodes of emesis.  She is afebrile at this time.   Objective: Vital signs in last 24 hours: Filed Vitals:   01/19/12 1937 01/19/12 2108 01/20/12 0504 01/20/12 0823  BP:  129/65 118/52   Pulse:  69 72   Temp:  99.5 F (37.5 C) 98.8 F (37.1 C)   TempSrc:  Oral Oral   Resp:  18 18   Height:      Weight:   75.1 kg (165 lb 9.1 oz)   SpO2: 98% 100% 97% 95%   Vitals reviewed. General: resting in chair comfortably, NAD HEENT: normocephalic, atraumatic, no scleral icterus Cardiac: RRR, no rubs, murmurs or gallops Pulm: clear to auscultation bilaterally, no wheezes, rales, or rhonchi Abd: obese, soft, nontender, nondistended, BS present Ext: warm and well perfused, +1 pedal edema bilaterally Neuro: alert and oriented X3, cranial nerves II-XII grossly intact, strength equal in bilateral upper and lower extremities Lab Results: Basic Metabolic Panel:  Lab 01/20/12 4098 01/19/12 1400 01/19/12 0710  NA 135 -- 133*  K 3.4* -- 3.2*  CL 102 -- 97  CO2 26 -- 26  GLUCOSE 102* -- 114*  BUN 3* -- 3*  CREATININE 0.62 -- 0.66  CALCIUM 8.1* -- 8.4  MG -- 1.8 --  PHOS -- -- --   Liver Function Tests:  Lab 01/14/12 1856 01/14/12 1330  AST 19 14  ALT 11 13  ALKPHOS 60 61  BILITOT 1.0 1.4*  PROT 7.2 6.8  ALBUMIN 3.1* 3.8   CBC:  Lab 01/20/12 0610 01/19/12 0710 01/14/12 1856  WBC 4.0 4.3 --  NEUTROABS -- -- 10.5*  HGB 9.1* 10.4* --  HCT 27.4* 30.1* --  MCV 89.5 90.1 --  PLT 282 289 --   Coagulation:  Lab 01/14/12 2332  LABPROT 16.6*  INR 1.38   Urine Drug Screen: Drugs of Abuse     Component Value Date/Time   LABOPIA NONE DETECTED 01/03/2007 0006   COCAINSCRNUR NONE DETECTED 01/03/2007 0006   LABBENZ NONE DETECTED 01/03/2007 0006   AMPHETMU NONE DETECTED 01/03/2007 0006   THCU NONE DETECTED 01/03/2007 0006   LABBARB   Value: NONE DETECTED        DRUG SCREEN FOR MEDICAL PURPOSES ONLY.  IF CONFIRMATION IS NEEDED FOR ANY PURPOSE, NOTIFY LAB WITHIN 5 DAYS.        LOWEST DETECTABLE LIMITS FOR URINE DRUG SCREEN Drug Class       Cutoff (ng/mL) Amphetamine      1000 Barbiturate      200 Benzodiazepine   200 Cocaine          300 Opiates          300 THC              50 01/03/2007 0006     Urinalysis:  Lab 01/17/12 2339 01/14/12 1246  COLORURINE AMBER* --  LABSPEC 1.013 --  PHURINE 5.5 --  GLUCOSEU NEGATIVE --  HGBUR NEGATIVE --  BILIRUBINUR NEGATIVE neg  KETONESUR NEGATIVE --  PROTEINUR NEGATIVE --  UROBILINOGEN 0.2 0.2  NITRITE NEGATIVE neg  LEUKOCYTESUR SMALL* Negative    Micro Results: Recent Results (from the past 240 hour(s))  URINE CULTURE     Status: Normal   Collection Time   01/14/12 12:44 PM      Component Value Range Status Comment   Colony Count NO  GROWTH   Final    Organism ID, Bacteria NO GROWTH   Final   CULTURE, BLOOD (ROUTINE X 2)     Status: Normal (Preliminary result)   Collection Time   01/14/12 11:30 PM      Component Value Range Status Comment   Specimen Description BLOOD ARM RIGHT   Final    Special Requests BOTTLES DRAWN AEROBIC AND ANAEROBIC 10CC   Final    Culture  Setup Time 01/15/2012 07:53   Final    Culture     Final    Value:        BLOOD CULTURE RECEIVED NO GROWTH TO DATE CULTURE WILL BE HELD FOR 5 DAYS BEFORE ISSUING A FINAL NEGATIVE REPORT   Report Status PENDING   Incomplete   CULTURE, BLOOD (ROUTINE X 2)     Status: Normal (Preliminary result)   Collection Time   01/14/12 11:35 PM      Component Value Range Status Comment   Specimen Description BLOOD HAND RIGHT   Final    Special Requests BOTTLES DRAWN AEROBIC ONLY 10CC   Final    Culture  Setup Time 01/15/2012 07:54   Final    Culture     Final    Value:        BLOOD CULTURE RECEIVED NO GROWTH TO DATE CULTURE WILL BE HELD FOR 5 DAYS BEFORE ISSUING A FINAL NEGATIVE REPORT   Report Status PENDING   Incomplete     Studies/Results: Ct Abdomen Pelvis W Contrast  01/18/2012  *RADIOLOGY REPORT*  Clinical Data: Increased abdominal pain and tenderness.  Fever. Diverticulitis with diverticular abscess.  CT ABDOMEN AND PELVIS WITH CONTRAST  Technique:  Multidetector CT imaging of the abdomen and pelvis was performed following the standard protocol during bolus administration of intravenous contrast.  Contrast: OMNIPAQUE IOHEXOL 300 MG/ML  SOLN  Comparison: 01/14/2012  Findings: The sigmoid diverticulitis shows no significant change compared to previous exam.  An extraluminal fluid collection in the central sigmoid mesentery is mildly increased in size, currently measuring 3.0 x 3.3 cm compared with 2.3 x 2.4 cm previously.  This is consistent with diverticular abscess.  There are also other extraluminal gas bubbles seen in the sigmoid mesentery, however there is no evidence of free intraperitoneal air.  A tiny amount of free fluid is also noted in the pelvic cul-de-sac.  Previous hysterectomy noted.  Adnexal regions are otherwise unremarkable in appearance.  There is no evidence of hydronephrosis.  Tiny bilateral renal cysts are again noted.  The adrenal glands, pancreas, spleen, and liver are normal in appearance.  Gallbladder is unremarkable.  No soft tissue masses or lymphadenopathy identified.  IMPRESSION:  Mild increase in size of diverticular abscess in the central sigmoid mesocolon measuring 3.3 cm.  No other significant change compared to prior exam.   Original Report Authenticated By: Myles Rosenthal, M.D.    Medications: I have reviewed the patient's current medications. Scheduled Meds:    . levofloxacin  500 mg Intravenous Q24H  . metroNIDAZOLE  500 mg Intravenous Q8H  . mometasone-formoterol  2 puff Inhalation BID   Continuous Infusions:    . dextrose 5 % and 0.45% NaCl 125 mL/hr at 01/19/12 2142   PRN Meds:.albuterol, chlorpheniramine-HYDROcodone, morphine injection, ondansetron (ZOFRAN) IV,  ondansetron Assessment/Plan: [name] is an 79yo woman who presented to the [facility] ED for evaluation of abdominal pain which was confirmed on imaging to be caused by diverticulitis.  Diverticulitis with perforation, abscess formation, and colonic pneumatosis: [name] did not have a history of diverticulitis; but her history, exam findings, and radiographic findings were consistent with diverticulitis. There was evidence of pneumatosis in the adjacent colonic wall and a pericolonic abscess (2.4 x 2.3 cm). She was admitted to a med-surg bed and consulted surgery who recommended medical therapy. Repeat CT scan on 01/18/2012 after 5 days of medical treatment revealed a mild increase in size of diverticular abscess in the central sigmoid mesocolon measuring 3.3 cm. She responded very well to ciprofloxacin and metronidazole. Repeat examination continued to show less tenderness. On 1/17 AM she had a high a fever of 101.  Afebrile over last 24 hours.  No leukocytosis with her WBC remaining around 4.   Plan - changed her back from low fiber diet to to clear diet since there is a feeling we could have advanced her too soon.  - changed back to IV antibiotics as recommended by surgery. This is day 7 of abx therapy - continue medical management for a few more days before pursuing surgery, per surgery recs.  appreicated continued recs - continue IVF, but decrease slightly given good po intake and mild pedal edema  - Morphine 2 mg every 2 hours as needed for pain   Microscopic hematuria: Urinalysis demonstrates moderate hemoglobin, positive nitrites, 3-6 WBCs, and 10-15 RBCs. Her symptoms of dysuria are concerning for cystitis, but this will be covered with antibiotics used to treat her diverticulitis. These findings can also be explained by the appositional pericolonic inflammation. She will need a followup urinalysis in the outpatient setting, following resolution of the intra-abdominal inflammation. Repeat urinalysis  with microscopy revealed only 0-2 RBCs per HPF.  - Treat diverticulitis as above   Community acquired pneumonia: Symptoms had resolved on presentation to ED with diverticulitis however, she started having coughs on 1/16. Her lung exam was normal . A chest x ray was normal. resolved.  Plan  - will continue with Levaquin  COPD: Stable. At home she uses albuterol and albuterol/ipratropium as needed for wheezing or shortness of breath. We'll continue these here. She sounded wheezy today more than usual.  Plan - changed her from Albuterol and albuterol/ipratropium every 6 hours as needed to [facility] bid  Hypertension: At home, she takes lisinopril 10 mg daily. Over the past couple months, her blood pressures have ranged from 160-100/80-60. On average it seems to be running 135/75. Current BP is normal at 126/54. Plan  - Hold lisinopril    Hyponatremia: Sodium on admission was 133. Other electrolytes were normal.  Resolved. Plan  - Monitor BMET  Hypokalemia: Today's BMET shows K level of 3.4 up slightly from 3.2.    Plan - will give another 3 rounds of KCL push  - continue to monitor  K daily  - Mg level normal at 1.8 on 01/19/12   Incidental pulmonary and adrenal nodules: 1.3 x 4.4 cm irregular peripheral pleural based parenchymal opacity in the right lower lobe on computed tomography. There is also a 1. 6 x 2.0 cm left adrenal nodule. The radiologist recommends followup imaging in 12 months for the adrenal nodule. According to the Fleischner Society recommendations, this pulmonary nodule will require imaging followup with a contrasted CT in 3 months. We will defer these for further evaluation as outpatient.   Prophylaxis: We will hold off on pharmacologic VTE until surgery gives [place] the recommendations. We will use SCDs for now.   Disposition: Patient's PCP is Dr. Windle Guard (unassigned), and she will not require OPC followup. Expected  length of stay is greater than 2 days.     LOS: 6  days   Sabino Dick T 01/20/2012, 9:19 AM

## 2012-01-20 NOTE — Progress Notes (Signed)
Resident Addendum to Medical Student Note   I have seen and examined the patient, and agree with the the medical student assessment and plan outlined above. Please see my brief note below for additional details.  S: Doing well, abdominal pain resolved   OBJECTIVE: VS: Reviewed  Meds: Reviewed  Labs: Reviewed  Imaging: Reviewed   Physical Exam: General: Vital signs reviewed and noted. Well-developed, well-nourished, pleasant AA female, in no acute distress; alert, appropriate and cooperative throughout examination, sitting in chair at bedside  HEENT: Normocephalic, atraumatic  Lungs:  Normal respiratory effort. Clear to auscultation BL without crackles or wheezes.  Heart: RRR. S1 and S2 normal without gallop, murmur, or rubs.  Abdomen:  BS normoactive. Soft, Nondistended, mild diffuse tenderness  Extremities: 1+ LE edema bilaterally, distal pulses intact     ASSESSMENT/ PLAN: Pt is a 79 y.o. yo female with admitted on 01/14/2012 with diverticulitis with abscess and perforation.   Diverticulitis with perforation: CT of abdomen with mild increase in size to 3.3 cm but undrainable due to location per Surgery, pt improving and is afebrile without leukocytosis.  Requesting advancement from clears for diet.  -full liquids today--> low fiber tomorrow   COPD: stable, cont Dulera bid   DVT ppx: SCDs   Disposition: Anticipate dc home tomorrow evening or Tuesday morning pending diet tolerance. Patient's PCP is Dr. Windle Guard (unassigned), and she will not require OPC followup.    Length of Stay: 6   Sanyia Dini  01/20/2012, 3:20 PM

## 2012-01-21 LAB — CULTURE, BLOOD (ROUTINE X 2): Culture: NO GROWTH

## 2012-01-21 LAB — CBC
MCH: 30.7 pg (ref 26.0–34.0)
MCHC: 34.1 g/dL (ref 30.0–36.0)
Platelets: 320 10*3/uL (ref 150–400)
RBC: 3.39 MIL/uL — ABNORMAL LOW (ref 3.87–5.11)
RDW: 13.1 % (ref 11.5–15.5)

## 2012-01-21 LAB — BASIC METABOLIC PANEL
CO2: 28 mEq/L (ref 19–32)
Calcium: 8.5 mg/dL (ref 8.4–10.5)
Creatinine, Ser: 0.65 mg/dL (ref 0.50–1.10)
GFR calc Af Amer: >90 mL/min (ref >90)
GFR calc non Af Amer: 79 mL/min — ABNORMAL LOW (ref >90)
Sodium: 140 mEq/L (ref 135–145)

## 2012-01-21 NOTE — Progress Notes (Signed)
Internal Medicine Attending  Date: 01/21/2012  Patient name: [name] Medical record number: [id number] Date of birth: 05-Aug-1925 Age: 79 y.o. Gender: female  I saw and evaluated the patient and discussed her care with house staff.  Patient reports that she is feeling better, tolerating diet; exam shows no abdominal tenderness.  Plans include continue antibiotics for diverticulitis with small abscess; anticipate possible discharge home tomorrow for completion of antibiotics as per surgery.

## 2012-01-21 NOTE — Progress Notes (Signed)
Patient seen and examined.  Agree with PA's note.  

## 2012-01-21 NOTE — Progress Notes (Signed)
Pt tolerated low fiber diet well.  No complaints of pain or nausea at this time.

## 2012-01-21 NOTE — Progress Notes (Signed)
Subjective: She is feeling better today. Her abdominal pain is almost gone. She had a bowel movement this morning and it was non-bloody. She reports that her cough has also improve a lot. She inquires whether she will be discharged soon.  Objective: Vital signs in last 24 hours: Filed Vitals:   01/21/12 0500 01/21/12 0557 01/21/12 0920 01/21/12 1358  BP:  132/54  118/50  Pulse:  71  82  Temp:  98.3 F (36.8 C)  98.4 F (36.9 C)  TempSrc:  Oral    Resp:  18  20  Height:      Weight: 161 lb 13.1 oz (73.4 kg)     SpO2:  97% 94% 92%   Weight change: -3 lb 12 oz (-1.7 kg)  General appearance: alert, cooperative, fatigued and no distress Lungs: clear to auscultation bilaterally Heart: regular rate and rhythm, S1, S2 normal, no murmur, click, rub or gallop Abdomen: soft, non-tender; bowel sounds normal; no masses,  no organomegaly Extremities: extremities normal, atraumatic, no cyanosis or edema Pulses: 2+ and symmetric Neurologic: Grossly normal Lab Results: Basic Metabolic Panel:  Lab 01/21/12 9562 01/20/12 0610 01/19/12 1400  NA 140 135 --  K 3.8 3.4* --  CL 101 102 --  CO2 28 26 --  GLUCOSE 104* 102* --  BUN <3* 3* --  CREATININE 0.65 0.62 --  CALCIUM 8.5 8.1* --  MG -- -- 1.8  PHOS -- -- --   Liver Function Tests:  Lab 01/14/12 1856  AST 19  ALT 11  ALKPHOS 60  BILITOT 1.0  PROT 7.2  ALBUMIN 3.1*   CBC:  Lab 01/21/12 0714 01/20/12 0610 01/14/12 1856  WBC 3.9* 4.0 --  NEUTROABS -- -- 10.5*  HGB 10.4* 9.1* --  HCT 30.5* 27.4* --  MCV 90.0 89.5 --  PLT 320 282 --   Coagulation:  Lab 01/14/12 2332  LABPROT 16.6*  INR 1.38   Urine Drug Screen: Drugs of Abuse     Component Value Date/Time   LABOPIA NONE DETECTED 01/03/2007 0006   COCAINSCRNUR NONE DETECTED 01/03/2007 0006   LABBENZ NONE DETECTED 01/03/2007 0006   AMPHETMU NONE DETECTED 01/03/2007 0006   THCU NONE DETECTED 01/03/2007 0006   LABBARB  Value: NONE DETECTED        DRUG SCREEN FOR MEDICAL  PURPOSES ONLY.  IF CONFIRMATION IS NEEDED FOR ANY PURPOSE, NOTIFY LAB WITHIN 5 DAYS.        LOWEST DETECTABLE LIMITS FOR URINE DRUG SCREEN Drug Class       Cutoff (ng/mL) Amphetamine      1000 Barbiturate      200 Benzodiazepine   200 Cocaine          300 Opiates          300 THC              50 01/03/2007 0006     Urinalysis:  Lab 01/17/12 2339  COLORURINE AMBER*  LABSPEC 1.013  PHURINE 5.5  GLUCOSEU NEGATIVE  HGBUR NEGATIVE  BILIRUBINUR NEGATIVE  KETONESUR NEGATIVE  PROTEINUR NEGATIVE  UROBILINOGEN 0.2  NITRITE NEGATIVE  LEUKOCYTESUR SMALL*    Micro Results: Recent Results (from the past 240 hour(s))  URINE CULTURE     Status: Normal   Collection Time   01/14/12 12:44 PM      Component Value Range Status Comment   Colony Count NO GROWTH   Final    Organism ID, Bacteria NO GROWTH   Final   CULTURE, BLOOD (ROUTINE  X 2)     Status: Normal   Collection Time   01/14/12 11:30 PM      Component Value Range Status Comment   Specimen Description BLOOD ARM RIGHT   Final    Special Requests BOTTLES DRAWN AEROBIC AND ANAEROBIC 10CC   Final    Culture  Setup Time 01/15/2012 07:53   Final    Culture NO GROWTH 5 DAYS   Final    Report Status 01/21/2012 FINAL   Final   CULTURE, BLOOD (ROUTINE X 2)     Status: Normal   Collection Time   01/14/12 11:35 PM      Component Value Range Status Comment   Specimen Description BLOOD HAND RIGHT   Final    Special Requests BOTTLES DRAWN AEROBIC ONLY 10CC   Final    Culture  Setup Time 01/15/2012 07:54   Final    Culture NO GROWTH 5 DAYS   Final    Report Status 01/21/2012 FINAL   Final    Studies/Results: No results found. Medications: I have reviewed the patient's current medications. Scheduled Meds:    . levofloxacin  500 mg Intravenous Q24H  . metroNIDAZOLE  500 mg Intravenous Q8H  . mometasone-formoterol  2 puff Inhalation BID   Continuous Infusions:   PRN Meds:.albuterol, chlorpheniramine-HYDROcodone, morphine injection, ondansetron  (ZOFRAN) IV, ondansetron Assessment/Plan: Ms. Bjelland is an 79yo woman who presented to [facility] ED for evaluation of abdominal pain which was confirmed on imaging to be caused by diverticulitis.  Diverticulitis with perforation, abscess formation, and colonic pneumatosis: Ms Considine did not have a history of diverticulitis; but her history, exam findings, and radiographic findings were consistent with diverticulitis. There was evidence of pneumatosis in the adjacent colonic wall and a pericolonic abscess (2.4 x 2.3 cm). She was admitted to a med-surg bed and consulted surgery who recommended medical therapy with IV antibiotics. Repeat CT scan on 01/18/2012 after 5 days of antiobiotic treatment revealed a mild increase in size of diverticular abscess in the central sigmoid mesocolon measuring 3.3 cm. She responded very well to ciprofloxacin and metronidazole. Repeat physical examinations continued to show less tenderness. On 1/17 AM she had a high a fever of 101 but she remained without leukocytosis with her WBC remaining at 5.1. She feels even better today.   Plan - this is day # 7/14 of antibiotic therapy  - She will be discharged tomorrow as recommended by surgery  - appreciate surgery in put - changed to low fiber diet - Morphine 2 mg every 2 hours as needed for pain   Microscopic hematuria: Urinalysis demonstrates moderate hemoglobin, positive nitrites, 3-6 WBCs, and 10-15 RBCs. Her symptoms of dysuria are concerning for cystitis, but this will be covered with antibiotics used to treat her diverticulitis. These findings can also be explained by the appositional pericolonic inflammation. She will need a followup urinalysis in the outpatient setting, following resolution of the intra-abdominal inflammation. Repeat urinalysis with microscopy on 01/19/2012 revealed only 0-2 RBCs per HPF which further points to diverticulitis as likely etiology of her initial hematuria. This will not need further evaluation.     Community acquired pneumonia: Symptoms had resolved on presentation to ED with diverticulitis however, she started having coughs on 1/16. Her lung exam was normal . A chest x ray was normal. resolved.  Plan  - will continue with Levaquin as treatment for diverticulitis above  COPD: Stable. At home she uses albuterol and albuterol/ipratropium as needed for wheezing or shortness of breath.  We'll continue these here. She sounded wheezy today more than usual.  Plan - changed her from Albuterol and albuterol/ipratropium every 6 hours as needed to [facility] bid  Hypertension: At home, she takes lisinopril 10 mg daily. Over the past couple months, her blood pressures have ranged from 160-100/80-60. On average it seems to be running 135/75. Current BP is normal at 126/54. Plan  - Hold lisinopril    Hyponatremia(resolved): Sodium on admission was 133. Other electrolytes were normal. This has resolved with her current sodium level within normal ranges. No further monitoring needed.   Hypokalemia(resolved): Today's BMET shows K level of 3.2. Etiology is most likely her low intake with IV fluids with D5%. This has too resolved. No further follow necessary.  Incidental pulmonary and adrenal nodules: 1.3 x 4.4 cm irregular peripheral pleural based parenchymal opacity in the right lower lobe on computed tomography. There is also a 1. 6 x 2.0 cm left adrenal nodule. The radiologist recommends followup imaging in 12 months for the adrenal nodule. According to the Fleischner Society recommendations, this pulmonary nodule will require imaging followup with a contrasted CT in 3 months. We will defer these for further evaluation as outpatient.   Prophylaxis: We will hold off on pharmacologic VTE until surgery gives [place] the recommendations. We will use SCDs for now.   Disposition: Patient's PCP is Dr. Windle Guard (unassigned), and she will not require OPC followup. Expected length of stay is greater than 2 days.      LOS: 7 days   Dow Adolph 01/21/2012, 4:16 PM

## 2012-01-21 NOTE — Progress Notes (Signed)
Subjective: Pt feels good today, +BM +flatus.  Pain very minimal.  Tolerating full diet well.  Pt ambulating through halls.  Objective: Vital signs in last 24 hours: Temp:  [98.3 F (36.8 C)-99.7 F (37.6 C)] 98.3 F (36.8 C) (01/20 0557) Pulse Rate:  [71-76] 71  (01/20 0557) Resp:  [18] 18  (01/20 0557) BP: (132-138)/(54-59) 132/54 mmHg (01/20 0557) SpO2:  [96 %-98 %] 97 % (01/20 0557) Weight:  [161 lb 13.1 oz (73.4 kg)] 161 lb 13.1 oz (73.4 kg) (01/20 0500) Last BM Date: 01/21/12  Intake/Output from previous day: 01/19 0701 - 01/20 0700 In: 440 [P.O.:440] Out: 1000 [Urine:1000] Intake/Output this shift:    PE: Gen:  Alert, NAD, pleasant Abd: Soft, NT/ND, +BS, no HSM   Lab Results:   [facility] 01/21/12 0714 01/20/12 0610  WBC 3.9* 4.0  HGB 10.4* 9.1*  HCT 30.5* 27.4*  PLT 320 282   BMET  Basename 01/21/12 0714 01/20/12 0610  NA 140 135  K 3.8 3.4*  CL 101 102  CO2 28 26  GLUCOSE 104* 102*  BUN <3* 3*  CREATININE 0.65 0.62  CALCIUM 8.5 8.1*   PT/INR No results found for this basename: LABPROT:2,INR:2 in the last 72 hours CMP     Component Value Date/Time   NA 140 01/21/2012 0714   K 3.8 01/21/2012 0714   CL 101 01/21/2012 0714   CO2 28 01/21/2012 0714   GLUCOSE 104* 01/21/2012 0714   BUN <3* 01/21/2012 0714   CREATININE 0.65 01/21/2012 0714   CREATININE 0.70 01/14/2012 1330   CALCIUM 8.5 01/21/2012 0714   PROT 7.2 01/14/2012 1856   ALBUMIN 3.1* 01/14/2012 1856   AST 19 01/14/2012 1856   ALT 11 01/14/2012 1856   ALKPHOS 60 01/14/2012 1856   BILITOT 1.0 01/14/2012 1856   GFRNONAA 79* 01/21/2012 0714   GFRAA >90 01/21/2012 0714   Lipase  No results found for this basename: lipase       Studies/Results: No results found.  Anti-infectives: Anti-infectives     Start     Dose/Rate Route Frequency Ordered Stop   01/20/12 0900   levofloxacin (LEVAQUIN) IVPB 500 mg        500 mg 100 mL/hr over 60 Minutes Intravenous Every 24 hours 01/19/12 1209     01/19/12 1430   metroNIDAZOLE (FLAGYL) IVPB 500 mg        500 mg 100 mL/hr over 60 Minutes Intravenous Every 8 hours 01/19/12 1209     01/18/12 0600   levofloxacin (LEVAQUIN) tablet 500 mg  Status:  Discontinued        500 mg Oral Daily 01/17/12 1518 01/19/12 1209   01/17/12 1800   metroNIDAZOLE (FLAGYL) tablet 500 mg  Status:  Discontinued        500 mg Oral 3 times per day 01/17/12 1518 01/19/12 1209   01/15/12 0200   ciprofloxacin (CIPRO) IVPB 400 mg  Status:  Discontinued        400 mg 200 mL/hr over 60 Minutes Intravenous Every 12 hours 01/15/12 0006 01/17/12 1518   01/15/12 0200   metroNIDAZOLE (FLAGYL) IVPB 500 mg  Status:  Discontinued        500 mg 100 mL/hr over 60 Minutes Intravenous Every 8 hours 01/15/12 0006 01/17/12 1518   01/14/12 2245   ertapenem (INVANZ) 1 g in sodium chloride 0.9 % 50 mL IVPB        1 g 100 mL/hr over 30 Minutes Intravenous  Once 01/14/12 2232 01/15/12  0018           Assessment/Plan 1. Diverticulitis with small abscess, undrainable   Plan:  1. Will advance to low residue diet 2. Cont abx therapy, will need 2 weeks of antibiotics after discharge 3.  Will plan for D/C tomorrow if tolerating regular diet   LOS: 7 days    DORT, Unnamed Zeien 01/21/2012, 10:06 AM Pager: [phone]

## 2012-01-22 ENCOUNTER — Telehealth (INDEPENDENT_AMBULATORY_CARE_PROVIDER_SITE_OTHER): Payer: Self-pay

## 2012-01-22 LAB — CBC
Platelets: 328 10*3/uL (ref 150–400)
RBC: 3.65 MIL/uL — ABNORMAL LOW (ref 3.87–5.11)
RDW: 12.9 % (ref 11.5–15.5)
WBC: 4.1 10*3/uL (ref 4.0–10.5)

## 2012-01-22 MED ORDER — LEVOFLOXACIN 500 MG PO TABS: 500.0000 mg | ORAL_TABLET | Freq: Every day | ORAL | Status: DC

## 2012-01-22 MED ORDER — CIPROFLOXACIN HCL 500 MG PO TABS: 500.0000 mg | ORAL_TABLET | Freq: Two times a day (BID) | ORAL | Status: DC

## 2012-01-22 MED ORDER — METRONIDAZOLE 500 MG PO TABS: 500.0000 mg | ORAL_TABLET | Freq: Three times a day (TID) | ORAL | Status: DC

## 2012-01-22 MED ORDER — METRONIDAZOLE 500 MG PO TABS: 500.0000 mg | ORAL_TABLET | Freq: Three times a day (TID) | ORAL | Status: AC

## 2012-01-22 MED ORDER — TRAMADOL HCL 50 MG PO TABS: 50.0000 mg | ORAL_TABLET | Freq: Four times a day (QID) | ORAL | Status: DC | PRN

## 2012-01-22 MED ORDER — ACETAMINOPHEN 500 MG PO TABS: 500.0000 mg | ORAL_TABLET | Freq: Four times a day (QID) | ORAL | Status: DC | PRN

## 2012-01-22 NOTE — Progress Notes (Signed)
Internal Medicine Attending  Date: 01/22/2012  Patient name: [name] Medical record number: [id number] Date of birth: May 22, 1925 Age: 79 y.o. Gender: female  I saw and evaluated the patient and discussed her care on a.m. rounds with house.  She is doing well, with no abdominal pain or tenderness, taking an oral diet without problems.  I agree with plan to discharge home today on a two-week course of oral antibiotics, with follow-up scheduled with her primary care physician and with Gen. Surgery.

## 2012-01-22 NOTE — Progress Notes (Signed)
Subjective: Pt is doing great, very little pain, no nausea.  Good BM's and flatus.  Ambulating well.  Ready to go home.  Objective: Vital signs in last 24 hours: Temp:  [98.4 F (36.9 C)-98.5 F (36.9 C)] 98.5 F (36.9 C) (01/21 0515) Pulse Rate:  [76-82] 76  (01/21 0515) Resp:  [18-20] 18  (01/21 0515) BP: (118-155)/(50-68) 155/68 mmHg (01/21 0515) SpO2:  [92 %-94 %] 94 % (01/21 0515) Weight:  [171 lb 15.3 oz (78 kg)] 171 lb 15.3 oz (78 kg) (01/21 0515) Last BM Date: 01/21/12  Intake/Output from previous day: 01/20 0701 - 01/21 0700 In: 1500 [P.O.:600; IV Piggyback:900] Out: -  Intake/Output this shift:    PE: Gen:  Alert, NAD, pleasant Abd: Soft, NT/ND, +BS, no HSM   Lab Results:   [facility] 01/21/12 0714 01/20/12 0610  WBC 3.9* 4.0  HGB 10.4* 9.1*  HCT 30.5* 27.4*  PLT 320 282   BMET  Basename 01/21/12 0714 01/20/12 0610  NA 140 135  K 3.8 3.4*  CL 101 102  CO2 28 26  GLUCOSE 104* 102*  BUN <3* 3*  CREATININE 0.65 0.62  CALCIUM 8.5 8.1*   PT/INR No results found for this basename: LABPROT:2,INR:2 in the last 72 hours CMP     Component Value Date/Time   NA 140 01/21/2012 0714   K 3.8 01/21/2012 0714   CL 101 01/21/2012 0714   CO2 28 01/21/2012 0714   GLUCOSE 104* 01/21/2012 0714   BUN <3* 01/21/2012 0714   CREATININE 0.65 01/21/2012 0714   CREATININE 0.70 01/14/2012 1330   CALCIUM 8.5 01/21/2012 0714   PROT 7.2 01/14/2012 1856   ALBUMIN 3.1* 01/14/2012 1856   AST 19 01/14/2012 1856   ALT 11 01/14/2012 1856   ALKPHOS 60 01/14/2012 1856   BILITOT 1.0 01/14/2012 1856   GFRNONAA 79* 01/21/2012 0714   GFRAA >90 01/21/2012 0714   Lipase  No results found for this basename: lipase       Studies/Results: No results found.  Anti-infectives: Anti-infectives     Start     Dose/Rate Route Frequency Ordered Stop   01/22/12 0000   ciprofloxacin (CIPRO) 500 MG tablet        500 mg Oral 2 times daily 01/22/12 0752     01/22/12 0000   metroNIDAZOLE (FLAGYL)  500 MG tablet        500 mg Oral 3 times daily 01/22/12 0752     01/20/12 0900   levofloxacin (LEVAQUIN) IVPB 500 mg        500 mg 100 mL/hr over 60 Minutes Intravenous Every 24 hours 01/19/12 1209     01/19/12 1430   metroNIDAZOLE (FLAGYL) IVPB 500 mg        500 mg 100 mL/hr over 60 Minutes Intravenous Every 8 hours 01/19/12 1209     01/18/12 0600   levofloxacin (LEVAQUIN) tablet 500 mg  Status:  Discontinued        500 mg Oral Daily 01/17/12 1518 01/19/12 1209   01/17/12 1800   metroNIDAZOLE (FLAGYL) tablet 500 mg  Status:  Discontinued        500 mg Oral 3 times per day 01/17/12 1518 01/19/12 1209   01/15/12 0200   ciprofloxacin (CIPRO) IVPB 400 mg  Status:  Discontinued        400 mg 200 mL/hr over 60 Minutes Intravenous Every 12 hours 01/15/12 0006 01/17/12 1518   01/15/12 0200   metroNIDAZOLE (FLAGYL) IVPB 500 mg  Status:  Discontinued        500 mg 100 mL/hr over 60 Minutes Intravenous Every 8 hours 01/15/12 0006 01/17/12 1518   01/14/12 2245   ertapenem (INVANZ) 1 g in sodium chloride 0.9 % 50 mL IVPB        1 g 100 mL/hr over 30 Minutes Intravenous  Once 01/14/12 2232 01/15/12 0018           Assessment/Plan 1. Diverticulitis with small abscess, undrainable   Plan:  1. Tolerating low fiber diet 2. Cont abx therapy, will need 2 weeks of antibiotics after discharge  3. Good from surgical standpoint to d/c today 4. Pt to f/u with Dr. Carolynne Edouard in 3 weeks    LOS: 8 days    DORT, [facility] 01/22/2012, 8:05 AM Pager: [phone]

## 2012-01-22 NOTE — Telephone Encounter (Signed)
I called and gave family member her appt info.

## 2012-01-22 NOTE — Telephone Encounter (Signed)
Message copied by Brennan Bailey on Tue Jan 22, 2012  2:24 PM ------      Message from: [place], [place]      Created: Tue Jan 22, 2012  8:15 AM                   ----- Message -----         From: Aris [place], PA-C         Sent: 01/22/2012   8:13 AM           To: [facility] Clinical Pool            Please schedule pt with Dr. Carolynne Edouard for f/u from diverticulitis in 3 weeks

## 2012-01-22 NOTE — Progress Notes (Signed)
Patient seen and examined.  Agree with PA's note.  

## 2012-01-22 NOTE — Discharge Summary (Signed)
Internal Medicine Teaching [facility] Discharge Note  Name: [name] MRN: [id number] DOB: Apr 21, 1925 79 y.o.  Date of Admission: 01/14/2012  9:12 PM Date of Discharge: 01/23/2012 Attending Physician: Dr Margarito Liner  Discharge Diagnosis: Principal Problem:  *Diverticulitis of colon with perforation Active Problems:  COPD (chronic obstructive pulmonary disease)  Hypertension  Pneumatosis of intestines  Intra-abdominal abscess  Hematuria, microscopic  Hyponatremia  Pulmonary nodules   Discharge Medications:   Medication List     As of 01/23/2012  6:26 AM    TAKE these medications         acetaminophen 500 MG tablet   Commonly known as: TYLENOL   Take 1 tablet (500 mg total) by mouth every 6 (six) hours as needed for pain.      albuterol 108 (90 BASE) MCG/ACT inhaler   Commonly known as: PROVENTIL HFA;VENTOLIN HFA   Inhale 2 puffs into the lungs every 6 (six) hours as needed for wheezing.      albuterol-ipratropium 18-103 MCG/ACT inhaler   Commonly known as: COMBIVENT   Inhale 2 puffs into the lungs every 6 (six) hours as needed. For shortness of breath      aspirin 81 MG tablet   Take 81 mg by mouth daily.      levofloxacin 500 MG tablet   Commonly known as: LEVAQUIN   Take 1 tablet (500 mg total) by mouth daily.      lisinopril 10 MG tablet   Commonly known as: PRINIVIL,ZESTRIL   Take 10 mg by mouth.      metroNIDAZOLE 500 MG tablet   Commonly known as: FLAGYL   Take 1 tablet (500 mg total) by mouth 3 (three) times daily.      MOTRIN IB 200 MG tablet   Generic drug: ibuprofen   Take 200 mg by mouth every 4 (four) hours as needed. For pain      traMADol 50 MG tablet   Commonly known as: ULTRAM   Take 1 tablet (50 mg total) by mouth every 6 (six) hours as needed for pain.      Vitamin D (Ergocalciferol) 50000 UNITS Caps   Commonly known as: DRISDOL   Take 50,000 Units by mouth every 14 (fourteen) days.         Disposition and follow-up:     [name] was discharged from [facility] in stable condition.  At the hospital follow up visit please address:  - Please encourage the patient to complete her full course of antibiotics. - Please encourage Ms. Kral to followup with the surgeons appointment. - Please consider adding  her combined inhale steroid, and long-acting bronchodilator on her regimen of COPD. - Please consider doing a chest CT scan with contrast in 3 months for evaluation of incidental pulmonary nodules -Please encourage Ms. Lucien to continue with low fiber diet.   Follow-up Appointments: Follow-up Information    Follow up with Robyne Askew, MD. Schedule an appointment as soon as possible for a visit in 3 weeks. (Call to make appt in 3 weeks)    Contact information:   [address] [place] [address] [phone]       Follow up with Kaleen Mask, MD. On 01/28/2012. (at 10:00am)    Contact information:   [address] [place] [address] [phone]         Discharge Orders    Future Appointments: Provider: Department: Dept Phone: Center:   02/13/2012 11:10 AM  Caleen Essex III, MD [facility], [place] [phone] None     Future Orders Please Complete By Expires   Diet - low sodium heart healthy      Increase activity slowly      Call MD for:  temperature >100.4      Call MD for:  persistant nausea and vomiting      Call MD for:  difficulty breathing, headache or visual disturbances      Call MD for:  severe uncontrolled pain         Consultations:    Procedures Performed:  Dg Chest 2 View  01/17/2012  *RADIOLOGY REPORT*  Clinical Data: Cough, short of breath, wheezing  CHEST - 2 VIEW  Comparison: Chest x-ray of 01/14/2012  Findings: The lungs are clear and slightly hyperaerated.  Somewhat prominent perihilar markings may be due to bronchitis. Cardiomegaly is stable.  There are degenerative changes throughout the thoracic spine.  Calcifications in the upper chest - lower neck probably represent calcified thyroid nodules.  IMPRESSION: No pneumonia.  Cannot exclude bronchitis.   Original Report Authenticated By: Dwyane Dee, M.D.    Dg Chest 2 View  01/14/2012  *RADIOLOGY REPORT*  Clinical Data: Pneumonia.  Shortness of breath.  Congestion.  CHEST - 2 VIEW  Comparison: 01/02/2012.  Findings: Improved right lower lobe airspace opacity compatible with resolving pneumonia.  Mild post infectious/inflammatory changes.  Cardiopericardial silhouette appears within normal limits.  Left lung clear. Right humeral head AVN incidentally noted.  IMPRESSION: Improved right lower lobe pneumonia with post infectious/inflammatory changes.   Original Report Authenticated By: Andreas Newport, M.D.    Dg Chest 2 View  01/02/2012  *RADIOLOGY REPORT*  Clinical Data: Cough.  CHEST - 2 VIEW  Comparison: 01/06/2007  Findings: Lungs are adequately inflated with mild hazy density of the lateral right base which may be due to infection versus atelectasis.  There is no effusion.  The cardiomediastinal silhouette and remainder of the exam to include calcification projected over the midline neck base is unchanged.  IMPRESSION: Hazy airspace density over the lateral right base suggesting atelectasis versus early infection.   Original Report Authenticated By: Elberta Fortis, M.D.    Dg Abd 1 View  01/14/2012  *RADIOLOGY REPORT*  Clinical Data: Abdominal pain.  ABDOMEN - 1 VIEW  Comparison: None.  Findings: Nonobstructive bowel gas pattern.  No plain film evidence of free air.  Thoracic and lumbar spondylosis is present.  The pelvis is not visualized on this single view abdomen radiograph.  IMPRESSION: No acute abnormality.   Original Report Authenticated By: Andreas Newport, M.D.    Ct Abdomen Pelvis W Contrast  01/18/2012  *RADIOLOGY REPORT*  Clinical Data: Increased abdominal pain and tenderness.  Fever. Diverticulitis with diverticular abscess.  CT ABDOMEN AND PELVIS  WITH CONTRAST  Technique:  Multidetector CT imaging of the abdomen and pelvis was performed following the standard protocol during bolus administration of intravenous contrast.  Contrast: OMNIPAQUE IOHEXOL 300 MG/ML  SOLN  Comparison: 01/14/2012  Findings: The sigmoid diverticulitis shows no significant change compared to previous exam.  An extraluminal fluid collection in the central sigmoid mesentery is mildly increased in size, currently measuring 3.0 x 3.3 cm compared with 2.3 x 2.4 cm previously.  This is consistent with diverticular abscess.  There are also other extraluminal gas bubbles seen in the sigmoid mesentery, however there is no evidence of free intraperitoneal air.  A tiny amount of free fluid is also noted in the pelvic cul-de-sac.  Previous hysterectomy noted.  Adnexal regions are otherwise unremarkable in appearance.  There is no evidence of hydronephrosis.  Tiny bilateral renal cysts are again noted.  The adrenal glands, pancreas, spleen, and liver are normal in appearance.  Gallbladder is unremarkable.  No soft tissue masses or lymphadenopathy identified.  IMPRESSION:  Mild increase in size of diverticular abscess in the central sigmoid mesocolon measuring 3.3 cm.  No other significant change compared to prior exam.   Original Report Authenticated By: Myles Rosenthal, M.D.    Ct Abdomen Pelvis W Contrast  01/14/2012  *RADIOLOGY REPORT*  Clinical Data: Abdominal pain  CT ABDOMEN AND PELVIS WITH CONTRAST  Technique:  Multidetector CT imaging of the abdomen and pelvis was performed following the standard protocol during bolus administration of intravenous contrast.  Contrast: OMNIPAQUE IOHEXOL 300 MG/ML  SOLN  Comparison: None.  Findings: Inflammatory changes of the sigmoid colon are present with stranding in the adjacent fat.  Diverticulosis is present in this area. There is however prominent gas within the region of the wall of the sigmoid colon worrisome for pneumatosis.  There is a  2.4 x 2.3 cm abscess containing fluid and gas adjacent to the sigmoid colon.  Other smaller abscesses containing gas bubbles are also noted.  There is a small amount of free fluid layering in the pelvis.  There is a 1.3 x 4.4 cm irregular peripheral pleural based parenchymal opacity in the peripheral basal segment of the right lower lobe.  Liver, gallbladder, spleen, pancreas are within normal limits. Several hypodensities are scattered throughout both kidneys and are difficult to characterize.  The larger ones are compatible with simple cyst.  1.6 x 2.0 cm nonspecific left adrenal nodule.  Mild prominence of the right adrenal gland.  Atherosclerotic changes of the visceral vasculature, aorta, and iliac vessels are present.  Advanced degenerative changes in the lumbar spine.  Umbilical hernia contains only adipose tissue.  IMPRESSION: There is an inflammatory process of the sigmoid colon with adjacent abscess compatible with perforation.  Findings suggest pneumatosis of the colon.  Differential diagnosis includes advanced inflammatory process such as diverticulitis or ischemia.  Nonspecific left adrenal nodule. Follow-up MRI or CT in 12 months is recommended.  If there is a history of cancer, MRI is recommended at this time.  The  Multiple renal hypodensities which are nonspecific.  MRI can be performed to further characterize.  Critical Value/emergent results were called by telephone at the time of interpretation on 01/14/2012 at 1630 hours to Dr. Dallas Schimke, who verbally acknowledged these results.   Original Report Authenticated By: Jolaine Click, M.D.      Admission HPI:  This is an 79 year old female with COPD and hypertension, presenting to the emergency department with 4 days of cough productive of purulent sputum and dyspnea. Onset was 3 days prior to presentation. Presenting symptoms were fatigue, malaise, and cough producing greenish sputum. She has dyspnea at baseline, which she says may be a little bit  worse now. There have been sick contacts in the family. Associated symptoms include anorexia and chills. No objective fevers measured at home. Over the last day or 2 she's had some abdominal/flank pain associated with coughing. She had some nausea here in the ED. She denies headaches, dizziness, prior nausea or vomiting, diarrhea, myalgias, or rashes. On review of systems, she does report dark urine and some discomfort with voiding.   Review of Systems:  Constitutional: Positive for chills and malaise/fatigue.  HENT: Negative.  Eyes: Negative.  Respiratory: Positive for  cough, sputum production and shortness of breath.  Cardiovascular: Negative.  Gastrointestinal: Positive for abdominal pain. Negative for nausea, vomiting and diarrhea.  Genitourinary: Positive for dysuria.  Musculoskeletal: Negative. Negative for myalgias.  Skin: Negative. Negative for rash.  Neurological: Positive for weakness. Negative for dizziness, sensory change and headaches.   Physical exam:  GENERAL: overweight; no acute distress  HEAD: atraumatic, normocephalic  EYES: pupils equal, round and reactive; sclera anicteric; normal conjunctiva  EARS: canals patent and TMs normal bilaterally  NOSE/THROAT: oropharynx clear, moist mucous membranes, pink gums, dentures in place  NECK: supple, no carotid bruits, thyroid normal in size and without palpable nodules  LYMPH: no cervical or supraclavicular lymphadenopathy  LUNGS: scattered bronchial sounds, no rales or wheezing, normal work of breathing  HEART: normal rate and regular rhythm; normal S1 and S2 without S3 or S4; no murmurs, rubs, or clicks  PULSES: radial 2+ and symmetric  ABDOMEN: soft, tenderness with palpation of the left upper quadrant and some mild generalized tenderness, normal bowel sounds, no masses  SKIN: warm, dry, intact, normal turgor, no rashes  EXTREMITIES: no peripheral edema, clubbing, or cyanosis  PSYCH: patient is alert and oriented, mood and  affect are normal and congruent, thought content is normal without delusions, thought process is linear, speech is normal and non-pressured, behavior is normal, judgement and insight are normal    Hospital Course by problem list:  Diverticulitis: Ms Olenick did not have a prior history of diverticulitis; but her clinical history, exam findings, and radiographic findings were consistent with diverticulitis. On abdominal CT scan on 01/14/2012, there was evidence of pneumatosis in the adjacent colonic wall and a pericolonic abscess (2.4 x 2.3 cm). She was admitted to a med-surg bed and we consulted surgery who recommended medical therapy with IV antibiotics including Levaquin and metronidazole. Repeat CT scan on 01/18/2012 after 5 days of antiobiotic treatment revealed a mild increase in size of diverticular abscess in the central sigmoid mesocolon measuring 3.3 cm but still medical therapy was continues since she was responding well. Repeat physical examinations continued to show less tenderness. She remained afebrile and without leukocytosis with her WBC remaining at 5.1. She completed 8 days of IV antibiotics and she was discharged with another 14 days of oral Levaquin and Metronidazole. She will follow up with surgery as outpatient. She was encouraged to maintain a low fiber diet as she recovers.    Microscopic hematuria: Urinalysis demonstrates moderate hemoglobin, positive nitrites, 3-6 WBCs, and 10-15 RBCs. Her symptoms of dysuria are concerning for cystitis, but this will be covered with antibiotics used to treat her diverticulitis. These findings can also be explained by the appositional pericolonic inflammation. She will need a followup urinalysis in the outpatient setting, following resolution of the intra-abdominal inflammation. Repeat urinalysis with microscopy on 01/19/2012 revealed only 0-2 RBCs per HPF which further points to diverticulitis as likely etiology of her initial hematuria. This will not  need further evaluation.   Community acquired pneumonia: Symptoms had resolved on presentation to ED with diverticulitis however, she started having coughs on 1/16. Her lung exam was normal. A chest x ray was normal. Her symptoms resolved before she was discharged.    COPD: This was stable. At home she uses albuterol and albuterol/ipratropium as needed for wheezing or shortness of breath. We continued with these during her hospital stay.  She will need to have a combined steroid and long acting bronchodilator added to her regimen.   Hypertension: At home, she takes lisinopril 10 mg  daily. Over the past couple months, her blood pressures have ranged from 160-100/80-60. On average it seems to be running 135/75. She was discharged on her usual Lisinopril of 10 mg once daily.   Incidental pulmonary and adrenal nodules: 1.3 x 4.4 cm irregular peripheral pleural based parenchymal opacity in the right lower lobe on computed tomography. There is also a 1. 6 x 2.0 cm left adrenal nodule. The radiologist recommends followup imaging in 12 months for the adrenal nodule. According to the Fleischner Society recommendations, this pulmonary nodule will require imaging followup with a contrasted CT in 3 months. We will defer these for further evaluation as outpatient.   Discharge Vitals:  BP 155/68  Pulse 76  Temp 98.5 F (36.9 C) (Oral)  Resp 18  Ht 5\' 3"  (1.6 m)  Wt 171 lb 15.3 oz (78 kg)  BMI 30.46 kg/m2  SpO2 94%  Discharge Labs:  Results for orders placed during the hospital encounter of 01/14/12 (from the past 24 hour(s))  CBC     Status: Abnormal   Collection Time   01/22/12  9:15 AM      Component Value Range   WBC 4.1  4.0 - 10.5 K/uL   RBC 3.65 (*) 3.87 - 5.11 MIL/uL   Hemoglobin 11.1 (*) 12.0 - 15.0 g/dL   HCT 45.4 (*) 09.8 - 11.9 %   MCV 88.5  78.0 - 100.0 fL   MCH 30.4  26.0 - 34.0 pg   MCHC 34.4  30.0 - 36.0 g/dL   RDW 14.7  82.9 - 56.2 %   Platelets 328  150 - 400 K/uL    3 Signed: Dow Adolph 01/23/2012, 6:26 AM   Time Spent on Discharge: 30 minutes  Services Ordered on Discharge: none  Equipment Ordered on Discharge: none

## 2012-01-22 NOTE — Progress Notes (Signed)
Subjective: She is feeling better today. Her abdominal pain is almost gone. She had a bowel movement this morning and it was non-bloody. She reports that her cough has also improve a lot. She will be discharged home today. Objective: Vital signs in last 24 hours: Filed Vitals:   01/21/12 1358 01/21/12 2111 01/21/12 2133 01/22/12 0515  BP: 118/50  145/66 155/68  Pulse: 82  77 76  Temp: 98.4 F (36.9 C)  98.4 F (36.9 C) 98.5 F (36.9 C)  TempSrc:   Oral Oral  Resp: 20  18 18   Height:      Weight:    171 lb 15.3 oz (78 kg)  SpO2: 92% 92% 93% 94%   Weight change: 10 lb 2.3 oz (4.6 kg)  General appearance: alert, cooperative, fatigued and no distress Lungs: clear to auscultation bilaterally Heart: regular rate and rhythm, S1, S2 normal, no murmur, click, rub or gallop Abdomen: soft, non-tender; bowel sounds normal; no masses,  no organomegaly Extremities: extremities normal, atraumatic, no cyanosis or edema Pulses: 2+ and symmetric Neurologic: Grossly normal Lab Results: Basic Metabolic Panel:  Lab 01/21/12 2956 01/20/12 0610 01/19/12 1400  NA 140 135 --  K 3.8 3.4* --  CL 101 102 --  CO2 28 26 --  GLUCOSE 104* 102* --  BUN <3* 3* --  CREATININE 0.65 0.62 --  CALCIUM 8.5 8.1* --  MG -- -- 1.8  PHOS -- -- --   Liver Function Tests: No results found for this basename: AST:2,ALT:2,ALKPHOS:2,BILITOT:2,PROT:2,ALBUMIN:2 in the last 168 hours CBC:  Lab 01/22/12 0915 01/21/12 0714  WBC 4.1 3.9*  NEUTROABS -- --  HGB 11.1* 10.4*  HCT 32.3* 30.5*  MCV 88.5 90.0  PLT 328 320   Coagulation: No results found for this basename: LABPROT:4,INR:4 in the last 168 hours Urine Drug Screen: Drugs of Abuse     Component Value Date/Time   LABOPIA NONE DETECTED 01/03/2007 0006   COCAINSCRNUR NONE DETECTED 01/03/2007 0006   LABBENZ NONE DETECTED 01/03/2007 0006   AMPHETMU NONE DETECTED 01/03/2007 0006   THCU NONE DETECTED 01/03/2007 0006   LABBARB  Value: NONE DETECTED        DRUG SCREEN  FOR MEDICAL PURPOSES ONLY.  IF CONFIRMATION IS NEEDED FOR ANY PURPOSE, NOTIFY LAB WITHIN 5 DAYS.        LOWEST DETECTABLE LIMITS FOR URINE DRUG SCREEN Drug Class       Cutoff (ng/mL) Amphetamine      1000 Barbiturate      200 Benzodiazepine   200 Cocaine          300 Opiates          300 THC              50 01/03/2007 0006     Urinalysis:  Lab 01/17/12 2339  COLORURINE AMBER*  LABSPEC 1.013  PHURINE 5.5  GLUCOSEU NEGATIVE  HGBUR NEGATIVE  BILIRUBINUR NEGATIVE  KETONESUR NEGATIVE  PROTEINUR NEGATIVE  UROBILINOGEN 0.2  NITRITE NEGATIVE  LEUKOCYTESUR SMALL*    Micro Results: Recent Results (from the past 240 hour(s))  URINE CULTURE     Status: Normal   Collection Time   01/14/12 12:44 PM      Component Value Range Status Comment   Colony Count NO GROWTH   Final    Organism ID, Bacteria NO GROWTH   Final   CULTURE, BLOOD (ROUTINE X 2)     Status: Normal   Collection Time   01/14/12 11:30 PM  Component Value Range Status Comment   Specimen Description BLOOD ARM RIGHT   Final    Special Requests BOTTLES DRAWN AEROBIC AND ANAEROBIC 10CC   Final    Culture  Setup Time 01/15/2012 07:53   Final    Culture NO GROWTH 5 DAYS   Final    Report Status 01/21/2012 FINAL   Final   CULTURE, BLOOD (ROUTINE X 2)     Status: Normal   Collection Time   01/14/12 11:35 PM      Component Value Range Status Comment   Specimen Description BLOOD HAND RIGHT   Final    Special Requests BOTTLES DRAWN AEROBIC ONLY 10CC   Final    Culture  Setup Time 01/15/2012 07:54   Final    Culture NO GROWTH 5 DAYS   Final    Report Status 01/21/2012 FINAL   Final    Studies/Results: No results found. Medications: I have reviewed the patient's current medications. Scheduled Meds:    . levofloxacin  500 mg Intravenous Q24H  . metroNIDAZOLE  500 mg Intravenous Q8H  . mometasone-formoterol  2 puff Inhalation BID   Continuous Infusions:   PRN Meds:.albuterol, chlorpheniramine-HYDROcodone, morphine injection,  ondansetron (ZOFRAN) IV, ondansetron Assessment/Plan: Ms. Altman is an 79yo woman who presented to the [facility] ED for evaluation of abdominal pain which was confirmed on imaging to be caused by diverticulitis.  Diverticulitis: Ms Buis did not have a prior history of diverticulitis; but her clinical history, exam findings, and radiographic findings were consistent with diverticulitis. On abdominal CT scan on 01/14/2012, there was evidence of pneumatosis in the adjacent colonic wall and a pericolonic abscess (2.4 x 2.3 cm). She was admitted to a med-surg bed and we consulted surgery who recommended medical therapy with IV antibiotics including Levaquin and metronidazole. Repeat CT scan on 01/18/2012 after 5 days of antiobiotic treatment revealed a mild increase in size of diverticular abscess in the central sigmoid mesocolon measuring 3.3 cm but still medical therapy was continues since she was responding well. Repeat physical examinations continued to show less tenderness. She remained afebrile and without leukocytosis with her WBC remaining at 5.1. She completed 8 days of antibiotics and she will be discharged with another 14 days of oral Levaquin and Metronidazole. She will follow up with surgery as outpatient. She was encouraged to maintain a low fiber diet as she recuperates.  Plan - this is day # 7/14 of antibiotic therapy  - She will be discharged tomorrow as recommended by surgery  - appreciate surgery in put - changed to low fiber diet - Morphine 2 mg every 2 hours as needed for pain   Microscopic hematuria: Urinalysis demonstrates moderate hemoglobin, positive nitrites, 3-6 WBCs, and 10-15 RBCs. Her symptoms of dysuria are concerning for cystitis, but this will be covered with antibiotics used to treat her diverticulitis. These findings can also be explained by the appositional pericolonic inflammation. She will need a followup urinalysis in the outpatient setting, following resolution of the  intra-abdominal inflammation. Repeat urinalysis with microscopy on 01/19/2012 revealed only 0-2 RBCs per HPF which further points to diverticulitis as likely etiology of her initial hematuria. This will not need further evaluation.    Community acquired pneumonia: Symptoms had resolved on presentation to ED with diverticulitis however, she started having coughs on 1/16. Her lung exam was normal . A chest x ray was normal. resolved.  Plan  - will continue with Levaquin as treatment for diverticulitis above  COPD: Stable. At home she uses  albuterol and albuterol/ipratropium as needed for wheezing or shortness of breath. We'll continue these here. She sounded wheezy today more than usual.  Plan - changed her from Albuterol and albuterol/ipratropium every 6 hours as needed to [facility] bid  Hypertension: At home, she takes lisinopril 10 mg daily. Over the past couple months, her blood pressures have ranged from 160-100/80-60. On average it seems to be running 135/75. Current BP is normal at 126/54. Plan  - Hold lisinopril    Hyponatremia(resolved): Sodium on admission was 133. Other electrolytes were normal. This has resolved with her current sodium level within normal ranges. No further monitoring needed.   Hypokalemia(resolved): Today's BMET shows K level of 3.2. Etiology is most likely her low intake with IV fluids with D5%. This has too resolved. No further follow necessary.  Incidental pulmonary and adrenal nodules: 1.3 x 4.4 cm irregular peripheral pleural based parenchymal opacity in the right lower lobe on computed tomography. There is also a 1. 6 x 2.0 cm left adrenal nodule. The radiologist recommends followup imaging in 12 months for the adrenal nodule. According to the Fleischner Society recommendations, this pulmonary nodule will require imaging followup with a contrasted CT in 3 months. We will defer these for further evaluation as outpatient.   Prophylaxis: We will hold off on  pharmacologic VTE until surgery gives [place] the recommendations. We will use SCDs for now.   Disposition: Patient's PCP is Dr. Windle Guard (unassigned), and she will not require OPC followup. Expected length of stay is greater than 2 days.     LOS: 8 days   Dow Adolph 01/22/2012, 11:05 AM

## 2012-01-23 DIAGNOSIS — R918 Other nonspecific abnormal finding of lung field: Secondary | ICD-10-CM | POA: Diagnosis present

## 2012-02-07 ENCOUNTER — Ambulatory Visit (INDEPENDENT_AMBULATORY_CARE_PROVIDER_SITE_OTHER): Payer: Medicare Other | Admitting: Surgery

## 2012-02-07 ENCOUNTER — Encounter (HOSPITAL_COMMUNITY): Payer: Self-pay | Admitting: General Practice

## 2012-02-07 ENCOUNTER — Inpatient Hospital Stay (HOSPITAL_COMMUNITY)
Admission: AD | Admit: 2012-02-07 | Discharge: 2012-02-12 | DRG: 392 | Disposition: A | Discharge status: Home Health | Payer: Medicare Other | Source: Ambulatory Visit | Attending: Surgery | Admitting: Surgery

## 2012-02-07 ENCOUNTER — Encounter (INDEPENDENT_AMBULATORY_CARE_PROVIDER_SITE_OTHER): Payer: Self-pay | Admitting: Surgery

## 2012-02-07 ENCOUNTER — Inpatient Hospital Stay (HOSPITAL_COMMUNITY): Payer: Medicare Other

## 2012-02-07 VITALS — BP 146/82 | HR 79 | Temp 98.8°F | Resp 20 | Ht 61.0 in | Wt 154.0 lb

## 2012-02-07 DIAGNOSIS — M19019 Primary osteoarthritis, unspecified shoulder: Secondary | ICD-10-CM | POA: Diagnosis present

## 2012-02-07 DIAGNOSIS — J449 Chronic obstructive pulmonary disease, unspecified: Secondary | ICD-10-CM | POA: Diagnosis present

## 2012-02-07 DIAGNOSIS — K5732 Diverticulitis of large intestine without perforation or abscess without bleeding: Secondary | ICD-10-CM

## 2012-02-07 DIAGNOSIS — Z87891 Personal history of nicotine dependence: Secondary | ICD-10-CM

## 2012-02-07 DIAGNOSIS — I509 Heart failure, unspecified: Secondary | ICD-10-CM | POA: Diagnosis present

## 2012-02-07 DIAGNOSIS — J4489 Other specified chronic obstructive pulmonary disease: Secondary | ICD-10-CM | POA: Diagnosis present

## 2012-02-07 DIAGNOSIS — Z8701 Personal history of pneumonia (recurrent): Secondary | ICD-10-CM

## 2012-02-07 DIAGNOSIS — E039 Hypothyroidism, unspecified: Secondary | ICD-10-CM | POA: Diagnosis present

## 2012-02-07 DIAGNOSIS — K630 Abscess of intestine: Secondary | ICD-10-CM | POA: Diagnosis present

## 2012-02-07 DIAGNOSIS — Z79899 Other long term (current) drug therapy: Secondary | ICD-10-CM

## 2012-02-07 DIAGNOSIS — I10 Essential (primary) hypertension: Secondary | ICD-10-CM | POA: Diagnosis present

## 2012-02-07 LAB — CBC WITH DIFFERENTIAL/PLATELET
Basophils Absolute: 0 10*3/uL (ref 0.0–0.1)
Basophils Relative: 1 % (ref 0–1)
MCHC: 33.2 g/dL (ref 30.0–36.0)
Neutro Abs: 4 10*3/uL (ref 1.7–7.7)
Neutrophils Relative %: 69 % (ref 43–77)
Platelets: 293 10*3/uL (ref 150–400)
RDW: 16.2 % — ABNORMAL HIGH (ref 11.5–15.5)

## 2012-02-07 LAB — PROTIME-INR: INR: 1.32 (ref 0.00–1.49)

## 2012-02-07 LAB — COMPREHENSIVE METABOLIC PANEL
AST: 20 U/L (ref 0–37)
Albumin: 3.4 g/dL — ABNORMAL LOW (ref 3.5–5.2)
Alkaline Phosphatase: 47 U/L (ref 39–117)
Chloride: 103 mEq/L (ref 96–112)
Potassium: 2.9 mEq/L — ABNORMAL LOW (ref 3.5–5.1)
Total Bilirubin: 0.5 mg/dL (ref 0.3–1.2)

## 2012-02-07 LAB — APTT: aPTT: 39 seconds — ABNORMAL HIGH (ref 24–37)

## 2012-02-07 MED ORDER — KCL IN DEXTROSE-NACL 20-5-0.9 MEQ/L-%-% IV SOLN: INTRAVENOUS | Status: DC

## 2012-02-07 MED ORDER — ONDANSETRON HCL 4 MG/2ML IJ SOLN
4.0000 mg | Freq: Four times a day (QID) | INTRAMUSCULAR | Status: DC | PRN
  Administered 2012-02-07: 4 mg via INTRAVENOUS
  Filled 2012-02-07: qty 2

## 2012-02-07 MED ORDER — HYDROMORPHONE HCL PF 1 MG/ML IJ SOLN
1.0000 mg | INTRAMUSCULAR | Status: DC | PRN
  Administered 2012-02-07 – 2012-02-08 (×4): 1 mg via INTRAVENOUS
  Filled 2012-02-07 (×4): qty 1

## 2012-02-07 MED ORDER — ENOXAPARIN SODIUM 30 MG/0.3ML ~~LOC~~ SOLN
30.0000 mg | SUBCUTANEOUS | Status: DC
  Administered 2012-02-07 – 2012-02-08 (×2): 30 mg via SUBCUTANEOUS
  Filled 2012-02-07 (×4): qty 0.3

## 2012-02-07 MED ORDER — POTASSIUM CHLORIDE 10 MEQ/100ML IV SOLN
10.0000 meq | INTRAVENOUS | Status: AC
  Administered 2012-02-07 (×3): 10 meq via INTRAVENOUS
  Filled 2012-02-07 (×3): qty 100

## 2012-02-07 MED ORDER — SODIUM CHLORIDE 0.9 % IV SOLN
1.0000 g | INTRAVENOUS | Status: DC
  Administered 2012-02-07 – 2012-02-10 (×4): 1 g via INTRAVENOUS
  Filled 2012-02-07 (×5): qty 1

## 2012-02-07 MED ORDER — IOHEXOL 300 MG/ML  SOLN
25.0000 mL | INTRAMUSCULAR | Status: AC
  Administered 2012-02-07 (×2): 25 mL via ORAL

## 2012-02-07 MED ORDER — PROMETHAZINE HCL 25 MG/ML IJ SOLN: 6.2500 mg | Freq: Once | INTRAMUSCULAR | Status: DC

## 2012-02-07 MED ORDER — KCL IN DEXTROSE-NACL 20-5-0.9 MEQ/L-%-% IV SOLN
INTRAVENOUS | Status: DC
  Administered 2012-02-07: 18:00:00 via INTRAVENOUS
  Filled 2012-02-07 (×2): qty 1000

## 2012-02-07 MED ORDER — IOHEXOL 300 MG/ML  SOLN
100.0000 mL | Freq: Once | INTRAMUSCULAR | Status: AC | PRN
  Administered 2012-02-07: 100 mL via INTRAVENOUS

## 2012-02-07 MED ORDER — KCL IN DEXTROSE-NACL 40-5-0.9 MEQ/L-%-% IV SOLN
INTRAVENOUS | Status: DC
  Administered 2012-02-07 – 2012-02-08 (×2): via INTRAVENOUS
  Administered 2012-02-08: 100 mL via INTRAVENOUS
  Administered 2012-02-09: 09:00:00 via INTRAVENOUS
  Administered 2012-02-11: 1000 mL via INTRAVENOUS
  Filled 2012-02-07 (×8): qty 1000

## 2012-02-07 MED ORDER — IOHEXOL 300 MG/ML  SOLN
25.0000 mL | INTRAMUSCULAR | Status: DC
Start: 2012-02-07 — End: 2012-02-07

## 2012-02-07 NOTE — Progress Notes (Signed)
K 2.9 at this time; on call MD paged to make aware; will await callback.

## 2012-02-07 NOTE — Progress Notes (Signed)
MD returned page; new orders entered.

## 2012-02-07 NOTE — Patient Instructions (Signed)
Admission to hospital

## 2012-02-07 NOTE — H&P (Signed)
Patient ID: [name], female DOB: 05-23-1925, 79 y.o. MRN: [id number]  Chief Complaint   Patient presents with   .  Follow-up     eval abd pain    HPI  [name] is a 79 y.o. female. Patient comes the office today due to increasing abdominal pain. She was at [facility] 2 weeks ago on the dock of the week of service with complicated diverticulitis and interloop abscess. She was treated medically with resolution of her symptoms. She developed more pain yesterday and today in her lower abdomen and is brought to the urgent office by her daughter. She is no nausea vomiting. Her pain is diffuse a 3-4/10 having increasing. She has had no followup CT scan. Denies fever or chills. Denies shortness of breath.  HPI  Past Medical History   Diagnosis  Date   .  Hypertension    .  Goiter      radioactive iodine ablation/notes 01/02/2012   .  COPD (chronic obstructive pulmonary disease)    .  CHF (congestive heart failure)    .  Pneumonia  ?01/02/2012; ? 2009   .  SOB (shortness of breath)      'all the time" (01/02/2012)   .  Hypothyroidism      "I have taken Synthroid before" (01/02/2012)   .  Arthritis      "right shoulder" (01/02/2012)    Past Surgical History   Procedure  Date   .  Cataract extraction w/ intraocular lens implant, bilateral  ?1990's   .  Appendectomy  1980's     "when they did hysterectomy" (01/02/2012)   .  Abdominal hysterectomy  1980's    Family History   Problem  Relation  Age of Onset   .  Heart disease     .  Cancer  Sister       Breast    Social History  History   Substance Use Topics   .  Smoking status:  Former Smoker -- 0.1 packs/day for 35 years     Types:  Cigarettes     Quit date:  01/02/1991   .  Smokeless tobacco:  Never Used   .  Alcohol Use:  No    Allergies   Allergen  Reactions   .  Indomethacin  Anaphylaxis and Other (See Comments)     "took it fine for awhile; one day I stopped breathing and I ended up in the hospital" (01/02/2012)    Current  Outpatient Prescriptions   Medication  Sig  Dispense  Refill   .  albuterol-ipratropium (COMBIVENT) 18-103 MCG/ACT inhaler  Inhale 2 puffs into the lungs every 6 (six) hours as needed.     Marland Kitchen  aspirin 81 MG tablet  Take 81 mg by mouth daily.     .  furosemide (LASIX) 20 MG tablet  Take 20 mg by mouth daily.     Marland Kitchen  levofloxacin (LEVAQUIN) 500 MG tablet  Take 1 tablet (500 mg total) by mouth daily.  14 tablet  0   .  lisinopril (PRINIVIL,ZESTRIL) 10 MG tablet  Take 10 mg by mouth.     .  metroNIDAZOLE (FLAGYL) 500 MG tablet  Take 500 mg by mouth 3 (three) times daily.     .  Vitamin D, Ergocalciferol, (DRISDOL) 50000 UNITS CAPS  Take 50,000 Units by mouth every 14 (fourteen) days.      Review of Systems  Review of Systems  Constitutional: Negative.  Respiratory:  Negative.  Cardiovascular: Negative.  Gastrointestinal: Positive for abdominal pain.  Genitourinary: Negative.  Musculoskeletal: Negative.  Neurological: Negative.  Hematological: Negative.  Psychiatric/Behavioral: Negative.   Blood pressure 146/82, pulse 79, temperature 98.8 F (37.1 C), temperature source Temporal, resp. rate 20, height 5\' 1"  (1.549 m), weight 154 lb (69.854 kg).  Physical Exam  Physical Exam  Constitutional: She is oriented to person, place, and time. She appears well-developed and well-nourished.  HENT:  Head: Normocephalic and atraumatic.  Eyes: EOM are normal. Pupils are equal, round, and reactive to light.  Neck: Normal range of motion. Neck supple.  Pulmonary/Chest: Effort normal and breath sounds normal.  Abdominal: There is tenderness.    Musculoskeletal: Normal range of motion.  Neurological: She is alert and oriented to person, place, and time.  Skin: Skin is warm and dry.  Psychiatric: She has a normal mood and affect. Her behavior is normal. Judgment and thought content normal.   Assessment   History Of diverticulitis with Interloop pelvic abscess with increasing abdominal pain over 48  hours on antibiotics   Plan   Patient requires admission to Hospital for IV antibiotics, repeat CT scanning to reevaluate previous pelvic abscess and IV fluids.   CORNETT,THOMAS A.

## 2012-02-07 NOTE — Progress Notes (Signed)
Patient ID: [name], female   DOB: Mar 06, 1925, 79 y.o.   MRN: [id number]  Chief Complaint  Patient presents with  . Follow-up    eval abd pain    HPI [name] is a 79 y.o. female.  Patient comes the office today due to increasing abdominal pain. She was at [facility] 2 weeks ago on the dock of the week of service with complicated diverticulitis and interloop abscess. She was treated medically with resolution of her symptoms. She developed more pain yesterday and today in her lower abdomen and is brought to the urgent office by her daughter. She is no nausea vomiting. Her pain is diffuse a 3-4/10 having increasing. She has had no followup CT scan. Denies fever or chills. Denies shortness of breath. HPI  Past Medical History  Diagnosis Date  . Hypertension   . Goiter     radioactive iodine ablation/notes 01/02/2012  . COPD (chronic obstructive pulmonary disease)   . CHF (congestive heart failure)   . Pneumonia ?01/02/2012; ? 2009  . SOB (shortness of breath)     'all the time" (01/02/2012)  . Hypothyroidism     "I have taken Synthroid before" (01/02/2012)  . Arthritis     "right shoulder" (01/02/2012)    Past Surgical History  Procedure Date  . Cataract extraction w/ intraocular lens  implant, bilateral ?1990's  . Appendectomy 1980's    "when they did hysterectomy" (01/02/2012)  . Abdominal hysterectomy 1980's    Family History  Problem Relation Age of Onset  . Heart disease    . Cancer Sister     Breast    Social History History  Substance Use Topics  . Smoking status: Former Smoker -- 0.1 packs/day for 35 years    Types: Cigarettes    Quit date: 01/02/1991  . Smokeless tobacco: Never Used  . Alcohol Use: No    Allergies  Allergen Reactions  . Indomethacin Anaphylaxis and Other (See Comments)    "took it fine for awhile; one day I stopped breathing and I ended up in the hospital" (01/02/2012)    Current Outpatient Prescriptions  Medication Sig Dispense Refill    . albuterol-ipratropium (COMBIVENT) 18-103 MCG/ACT inhaler Inhale 2 puffs into the lungs every 6 (six) hours as needed.      Marland Kitchen aspirin 81 MG tablet Take 81 mg by mouth daily.      . furosemide (LASIX) 20 MG tablet Take 20 mg by mouth daily.      Marland Kitchen levofloxacin (LEVAQUIN) 500 MG tablet Take 1 tablet (500 mg total) by mouth daily.  14 tablet  0  . lisinopril (PRINIVIL,ZESTRIL) 10 MG tablet Take 10 mg by mouth.      . metroNIDAZOLE (FLAGYL) 500 MG tablet Take 500 mg by mouth 3 (three) times daily.      . Vitamin D, Ergocalciferol, (DRISDOL) 50000 UNITS CAPS Take 50,000 Units by mouth every 14 (fourteen) days.        Review of Systems Review of Systems  Constitutional: Negative.   Respiratory: Negative.   Cardiovascular: Negative.   Gastrointestinal: Positive for abdominal pain.  Genitourinary: Negative.   Musculoskeletal: Negative.   Neurological: Negative.   Hematological: Negative.   Psychiatric/Behavioral: Negative.     Blood pressure 146/82, pulse 79, temperature 98.8 F (37.1 C), temperature source Temporal, resp. rate 20, height 5\' 1"  (1.549 m), weight 154 lb (69.854 kg).  Physical Exam Physical Exam  Constitutional: She is oriented to person, place, and time. She appears  well-developed and well-nourished.  HENT:  Head: Normocephalic and atraumatic.  Eyes: EOM are normal. Pupils are equal, round, and reactive to light.  Neck: Normal range of motion. Neck supple.  Pulmonary/Chest: Effort normal and breath sounds normal.  Abdominal: There is tenderness.    Musculoskeletal: Normal range of motion.  Neurological: She is alert and oriented to person, place, and time.  Skin: Skin is warm and dry.  Psychiatric: She has a normal mood and affect. Her behavior is normal. Judgment and thought content normal.      Assessment    History  Of  diverticulitis with  Interloop pelvic abscess with increasing abdominal pain over 48 hours on antibiotics    Plan    Patient requires  admission to Hospital for IV antibiotics, repeat CT scanning to reevaluate previous pelvic abscess and IV fluids.       Shana Younge A. 02/07/2012, 3:39 PM

## 2012-02-07 NOTE — Progress Notes (Signed)
Pt c/o nausea; pt given 4mg IV Zofran at this time; will cont. To monitor. 

## 2012-02-08 DIAGNOSIS — K5732 Diverticulitis of large intestine without perforation or abscess without bleeding: Secondary | ICD-10-CM

## 2012-02-08 DIAGNOSIS — E876 Hypokalemia: Secondary | ICD-10-CM

## 2012-02-08 LAB — BASIC METABOLIC PANEL
CO2: 28 mEq/L (ref 19–32)
Chloride: 103 mEq/L (ref 96–112)
Creatinine, Ser: 0.72 mg/dL (ref 0.50–1.10)
Potassium: 3.8 mEq/L (ref 3.5–5.1)

## 2012-02-08 NOTE — Progress Notes (Signed)
INITIAL NUTRITION ASSESSMENT  DOCUMENTATION CODES Per approved criteria  -Not Applicable   INTERVENTION:  Resource Breeze twice daily (250 kcals, 9 gm protein per 8 fl oz carton) RD to follow for nutrition care plan  NUTRITION DIAGNOSIS: Inadequate oral intake related to decreased appetite as evidenced by patient report  Goal: Oral intake with meals & supplements to meet >/= 90% of estimated nutrition needs  Monitor:  PO & supplemental intake, weight, labs, I/O's  Reason for Assessment: Malnutrition Screening Tool Report  79 y.o. female  Admitting Dx: Diverticulitis with abscess and microperforation  ASSESSMENT: Patient presented to [facility] ED for evaluation of abdominal pain; CT of abdomen/pelvis showed changes of diverticulitis and a peri diverticular abscess; patient reports her appetite has been decreased for the past week; states she suspects some weight loss, however, unable to quantify; would benefit from addition of supplements as she's on Clear Liquids at this time ---> amenable to RD ordering.  Height: Ht Readings from Last 1 Encounters:  02/07/12 5\' 1"  (1.549 m)    Weight: Wt Readings from Last 1 Encounters:  02/07/12 154 lb 1.6 oz (69.899 kg)    Ideal Body Weight: 105 lb  % Ideal Body Weight: 146%  Wt Readings from Last 10 Encounters:  02/07/12 154 lb 1.6 oz (69.899 kg)  02/07/12 154 lb (69.854 kg)  01/22/12 171 lb 15.3 oz (78 kg)  01/14/12 156 lb (70.761 kg)  01/02/12 155 lb (70.308 kg)  01/02/12 155 lb (70.308 kg)  01/29/11 170 lb (77.111 kg)    Usual Body Weight: unknown  % Usual Body Weight: ---  BMI:  Body mass index is 29.12 kg/(m^2).  Estimated Nutritional Needs: Kcal: 1700-1900 Protein: 80-90 gm Fluid: 1.7-1.9 L  Skin: Intact  Diet Order: Clear Liquid  EDUCATION NEEDS: -No education needs identified at this time   Intake/Output Summary (Last 24 hours) at 02/08/12 1030 Last data filed at 02/08/12 0835  Gross per 24 hour  Intake  1936.67 ml  Output    600 ml  Net 1336.67 ml    Last BM: 2/6  Labs:   Lab 02/08/12 0530 02/07/12 1656  NA 139 142  K 3.8 2.9*  CL 103 103  CO2 28 28  BUN 9 10  CREATININE 0.72 0.71  CALCIUM 8.4 8.8  MG -- --  PHOS -- --  GLUCOSE 129* 104*    Scheduled Meds:   . enoxaparin (LOVENOX) injection  30 mg Subcutaneous Q24H  . ertapenem (INVANZ) IV  1 g Intravenous Q24H  . promethazine  6.25 mg Intravenous Once    Continuous Infusions:   . dextrose 5 % and 0.9 % NaCl with KCl 40 mEq/L 100 mL/hr at 02/07/12 2234    Past Medical History  Diagnosis Date  . Hypertension   . Goiter     radioactive iodine ablation/notes 01/02/2012  . COPD (chronic obstructive pulmonary disease)   . CHF (congestive heart failure)   . Pneumonia ?01/02/2012; ? 2009  . SOB (shortness of breath)     'all the time" (01/02/2012)  . Hypothyroidism     "I have taken Synthroid before" (01/02/2012)  . Arthritis     "right shoulder" (01/02/2012)  . Diverticulitis     Past Surgical History  Procedure Date  . Cataract extraction w/ intraocular lens  implant, bilateral ?1990's  . Appendectomy 1980's    "when they did hysterectomy" (01/02/2012)  . Abdominal hysterectomy 1980's    Maureen Chatters, RD, LDN Pager #: [phone] After-Hours Pager #: [phone]

## 2012-02-08 NOTE — Progress Notes (Signed)
Utilization Review Completed.[name] T2/07/2012   

## 2012-02-08 NOTE — Progress Notes (Signed)
Subjective: Alert and stable. AfebrileSays her pain is better this morning. Had a bowel movement.. She reports that she was feeling reasonably good until 2 days ago and then had an  increase in pain.  CT scan Shows small interloop abscess and changes of sigmoid diverticulitis actually showed mild improvement. There are a few tiny dots of free air of uncertain significance.  WBC 5800, hemoglobin 12.1, potassium 2.9 creatinine 0.71.  Objective: Vital signs in last 24 hours: Temp:  [97.3 F (36.3 C)-98.8 F (37.1 C)] 98 F (36.7 C) (02/06 2212) Pulse Rate:  [76-83] 83  (02/06 2212) Resp:  [20] 20  (02/06 2212) BP: (146-151)/(82-85) 151/85 mmHg (02/06 2212) SpO2:  [97 %] 97 % (02/06 2212) Weight:  [154 lb (69.854 kg)-154 lb 1.6 oz (69.899 kg)] 154 lb 1.6 oz (69.899 kg) (02/06 1656) Last BM Date: 02/07/12  Intake/Output from previous day: 02/06 0701 - 02/07 0700 In: 231.7 [I.V.:81.7; IV Piggyback:150] Out: 450 [Urine:450] Intake/Output this shift: Total I/O In: -  Out: 250 [Urine:250]  General appearance: alert. Does not appear to be any distress. Talkative. Elderly GI: abdomen soft. Mild tenderness lower midline left lower quadrant, no guarding, no mass, no distention.  Lab Results:   [facility] 02/07/12 1656  WBC 5.8  HGB 12.1  HCT 36.4  PLT 293   BMET  Basename 02/07/12 1656  NA 142  K 2.9*  CL 103  CO2 28  GLUCOSE 104*  BUN 10  CREATININE 0.71  CALCIUM 8.8   PT/INR  Basename 02/07/12 1656  LABPROT 16.1*  INR 1.32   ABG No results found for this basename: PHART:2,PCO2:2,PO2:2,HCO3:2 in the last 72 hours  Studies/Results: Ct Abdomen Pelvis W Contrast  02/07/2012  *RADIOLOGY REPORT*  Clinical Data: Abdominal pain, nausea and weakness.  CT ABDOMEN AND PELVIS WITH CONTRAST  Technique:  Multidetector CT imaging of the abdomen and pelvis was performed following the standard protocol during bolus administration of intravenous contrast.  Contrast: OMNIPAQUE  IOHEXOL 300 MG/ML  SOLN  Comparison: 01/18/2012  Findings: Since the prior study, the peridiverticular abscess has further decreased in size, now measuring 22 mm x 13 mm in size.  It is no longer clearly an abscess, and is more suggestive of a focal area of inflammation.  There is a linear area of inflammation and extraluminal air that extends above this which nodes present previously, but is smaller.  A few small bubbles of free intraperitoneal air are seen along the anterior upper abdomen. Numerous diverticula are again noted throughout the sigmoid and descending colon.  There are no new findings of diverticulitis.  There is posterior lateral right lower lobe opacity consist with atelectasis.  This is stable.  A small focus of nodular opacity at the posterior left lung base noted previously is smaller.  No new lung base abnormalities.  Normal liver, spleen, gallbladder and pancreas.  No bile duct dilation.  There is adrenal gland thickening mostly on the left suggesting hyperplasia.  No discrete adrenal mass.  This is stable.  Bilateral renal cysts, also stable.  No hydronephrosis.  Normal ureters.  The bladder is unremarkable.  The uterus is surgically absent.  No pelvic masses.  No evidence for bowel obstruction.  Small fat containing periumbilical hernia.  Stable degenerative changes noted throughout the visualized spine.  IMPRESSION: Changes of diverticulitis and a peri diverticular abscess show mild further improvement.  There is no evidence of worsening or of a new area of diverticulitis.  There are a few small bubbles  of free intraperitoneal air in the anterior upper abdomen, which were not evident previously.  Stable right posterior lateral lower lobe opacity, likely atelectasis.  Small focal opacity at the left posterior lung base has decreased in size, also likely atelectasis.  Stable renal cysts.  Other chronic findings as detailed.   Original Report Authenticated By: Amie Portland, M.D.      Anti-infectives: Anti-infectives     Start     Dose/Rate Route Frequency Ordered Stop   02/07/12 1800   ertapenem (INVANZ) 1 g in sodium chloride 0.9 % 50 mL IVPB        1 g 100 mL/hr over 30 Minutes Intravenous Every 24 hours 02/07/12 1646            Assessment/Plan:  Diverticulitis with a small pericolonic abscess. Readmitted due to pain.Too small to drain percutaneously. CT shows no new or progressive findings. We'll allow clear liquids and continue IV antibiotics. If her pain does not resolve, consider surgical intervention this admission. There is no acute surgical need at this time.  Hypertension  COPD And history of recent CAP. Treated with Levaquin by the internal medicine teaching service  Congestive heart failure  Remote history appendectomy and hysterectomy.  Incidental pulmonary and adrenal nodules, followed by internal medicine teaching service  DVT. Prophylaxis. We'll start subcutaneous lovenox.   LOS: 1 day    Leea Rambeau M. Derrell Lolling, M.D., [facility] General and Minimally invasive Surgery Breast and Colorectal Surgery Office:   [phone] Pager:   [phone]  02/08/2012

## 2012-02-09 LAB — BASIC METABOLIC PANEL
BUN: 7 mg/dL (ref 6–23)
CO2: 25 mEq/L (ref 19–32)
Calcium: 8.5 mg/dL (ref 8.4–10.5)
Creatinine, Ser: 0.7 mg/dL (ref 0.50–1.10)
GFR calc non Af Amer: 77 mL/min — ABNORMAL LOW (ref >90)
Glucose, Bld: 111 mg/dL — ABNORMAL HIGH (ref 70–99)
Sodium: 138 mEq/L (ref 135–145)

## 2012-02-09 LAB — CBC
Hemoglobin: 11.2 g/dL — ABNORMAL LOW (ref 12.0–15.0)
MCH: 30.7 pg (ref 26.0–34.0)
MCHC: 32.3 g/dL (ref 30.0–36.0)
MCV: 95.1 fL (ref 78.0–100.0)
RBC: 3.65 MIL/uL — ABNORMAL LOW (ref 3.87–5.11)

## 2012-02-09 MED ORDER — OXYCODONE-ACETAMINOPHEN 5-325 MG PO TABS
1.0000 | ORAL_TABLET | ORAL | Status: DC | PRN
  Administered 2012-02-09 (×2): 1 via ORAL
  Filled 2012-02-09 (×2): qty 1

## 2012-02-09 MED ORDER — IPRATROPIUM-ALBUTEROL 20-100 MCG/ACT IN AERS
2.0000 | INHALATION_SPRAY | Freq: Four times a day (QID) | RESPIRATORY_TRACT | Status: DC
  Filled 2012-02-09: qty 4

## 2012-02-09 MED ORDER — ENOXAPARIN SODIUM 40 MG/0.4ML ~~LOC~~ SOLN
40.0000 mg | SUBCUTANEOUS | Status: DC
  Administered 2012-02-09 – 2012-02-11 (×3): 40 mg via SUBCUTANEOUS
  Filled 2012-02-09 (×4): qty 0.4

## 2012-02-09 NOTE — Progress Notes (Signed)
Subjective: FEELS BETTER  Objective: Vital signs in last 24 hours: Temp:  [99 F (37.2 C)-99.6 F (37.6 C)] 99.1 F (37.3 C) (02/08 0500) Pulse Rate:  [76-82] 76 (02/08 0500) Resp:  [18] 18 (02/08 0500) BP: (127-138)/(68-78) 133/68 mmHg (02/08 0500) SpO2:  [94 %-97 %] 97 % (02/08 0500) Weight:  [157 lb 13.6 oz (71.6 kg)] 157 lb 13.6 oz (71.6 kg) (02/08 0500) Last BM Date: 02/08/12  Intake/Output from previous day: 02/07 0701 - 02/08 0700 In: 2558.3 [P.O.:480; I.V.:2028.3; IV Piggyback:50] Out: 400 [Urine:400] Intake/Output this shift:    GI: abnormal findings:  SOFT NON TENDER  NON DISTENDED  Lab Results:   Recent Labs  02/07/12 1656 02/09/12 0600  WBC 5.8 6.1  HGB 12.1 11.2*  HCT 36.4 34.7*  PLT 293 281   BMET  Recent Labs  02/08/12 0530 02/09/12 0600  NA 139 138  K 3.8 4.1  CL 103 107  CO2 28 25  GLUCOSE 129* 111*  BUN 9 7  CREATININE 0.72 0.70  CALCIUM 8.4 8.5   PT/INR  Recent Labs  02/07/12 1656  LABPROT 16.1*  INR 1.32   ABG No results found for this basename: PHART, PCO2, PO2, HCO3,  in the last 72 hours  Studies/Results: Ct Abdomen Pelvis W Contrast  02/07/2012  *RADIOLOGY REPORT*  Clinical Data: Abdominal pain, nausea and weakness.  CT ABDOMEN AND PELVIS WITH CONTRAST  Technique:  Multidetector CT imaging of the abdomen and pelvis was performed following the standard protocol during bolus administration of intravenous contrast.  Contrast: OMNIPAQUE IOHEXOL 300 MG/ML  SOLN  Comparison: 01/18/2012  Findings: Since the prior study, the peridiverticular abscess has further decreased in size, now measuring 22 mm x 13 mm in size.  It is no longer clearly an abscess, and is more suggestive of a focal area of inflammation.  There is a linear area of inflammation and extraluminal air that extends above this which nodes present previously, but is smaller.  A few small bubbles of free intraperitoneal air are seen along the anterior upper abdomen.  Numerous diverticula are again noted throughout the sigmoid and descending colon.  There are no new findings of diverticulitis.  There is posterior lateral right lower lobe opacity consist with atelectasis.  This is stable.  A small focus of nodular opacity at the posterior left lung base noted previously is smaller.  No new lung base abnormalities.  Normal liver, spleen, gallbladder and pancreas.  No bile duct dilation.  There is adrenal gland thickening mostly on the left suggesting hyperplasia.  No discrete adrenal mass.  This is stable.  Bilateral renal cysts, also stable.  No hydronephrosis.  Normal ureters.  The bladder is unremarkable.  The uterus is surgically absent.  No pelvic masses.  No evidence for bowel obstruction.  Small fat containing periumbilical hernia.  Stable degenerative changes noted throughout the visualized spine.  IMPRESSION: Changes of diverticulitis and a peri diverticular abscess show mild further improvement.  There is no evidence of worsening or of a new area of diverticulitis.  There are a few small bubbles of free intraperitoneal air in the anterior upper abdomen, which were not evident previously.  Stable right posterior lateral lower lobe opacity, likely atelectasis.  Small focal opacity at the left posterior lung base has decreased in size, also likely atelectasis.  Stable renal cysts.  Other chronic findings as detailed.   Original Report Authenticated By: Amie Portland, M.D.     Anti-infectives: Anti-infectives   Start  Dose/Rate Route Frequency Ordered Stop   02/07/12 1800  ertapenem (INVANZ) 1 g in sodium chloride 0.9 % 50 mL IVPB     1 g 100 mL/hr over 30 Minutes Intravenous Every 24 hours 02/07/12 1646        Assessment/Plan: Diverticulitis with abscess clinically better on Invanz D/C tele Soft diet Decrease IVF OOB   LOS: 2 days    Hoke Baer A. 02/09/2012

## 2012-02-09 NOTE — Progress Notes (Signed)
Tele monitor d/c at this time per MD order; will cont. To monitor.

## 2012-02-09 NOTE — Progress Notes (Signed)
MD returned page; new orders entered; will make pt and family aware.

## 2012-02-09 NOTE — Progress Notes (Signed)
Pt and family concerned that no home meds were resumed on admission; MD paged to make aware; pt particularly concerned about her Combivent inhaler; will await callback.

## 2012-02-09 NOTE — Progress Notes (Signed)
Pt ambulated 250 feet in hallway with daughter; will cont. To monitor.

## 2012-02-10 ENCOUNTER — Inpatient Hospital Stay (HOSPITAL_COMMUNITY): Payer: Medicare Other

## 2012-02-10 MED ORDER — IPRATROPIUM BROMIDE HFA 17 MCG/ACT IN AERS: 2.0000 | INHALATION_SPRAY | RESPIRATORY_TRACT | Status: DC

## 2012-02-10 MED ORDER — IPRATROPIUM-ALBUTEROL 20-100 MCG/ACT IN AERS: 1.0000 | INHALATION_SPRAY | Freq: Four times a day (QID) | RESPIRATORY_TRACT | Status: DC

## 2012-02-10 MED ORDER — IPRATROPIUM BROMIDE HFA 17 MCG/ACT IN AERS
1.0000 | INHALATION_SPRAY | Freq: Two times a day (BID) | RESPIRATORY_TRACT | Status: DC
  Administered 2012-02-10 – 2012-02-12 (×5): 1 via RESPIRATORY_TRACT
  Filled 2012-02-10: qty 12.9

## 2012-02-10 MED ORDER — ALBUTEROL SULFATE HFA 108 (90 BASE) MCG/ACT IN AERS
1.0000 | INHALATION_SPRAY | Freq: Two times a day (BID) | RESPIRATORY_TRACT | Status: DC
  Administered 2012-02-10 – 2012-02-12 (×5): 1 via RESPIRATORY_TRACT
  Filled 2012-02-10: qty 6.7

## 2012-02-10 NOTE — Progress Notes (Signed)
  Subjective: Pt feels SOB. She did tolerate diet without pain  Objective: Vital signs in last 24 hours: Temp:  [97.9 F (36.6 C)-98.4 F (36.9 C)] 98.4 F (36.9 C) (02/09 0428) Pulse Rate:  [74-91] 74 (02/09 0428) Resp:  [16-20] 20 (02/09 0428) BP: (130-150)/(73-84) 130/73 mmHg (02/09 0428) SpO2:  [93 %-98 %] 93 % (02/09 0428) Last BM Date: 02/09/12  Intake/Output from previous day: 02/08 0701 - 02/09 0700 In: 793.8 [P.O.:720; I.V.:23.8; IV Piggyback:50] Out: 850 [Urine:850] Intake/Output this shift:    Resp: diminished breath sounds bilaterally GI: soft, minimal tenderness.  Lab Results:   Recent Labs  02/07/12 1656 02/09/12 0600  WBC 5.8 6.1  HGB 12.1 11.2*  HCT 36.4 34.7*  PLT 293 281   BMET  Recent Labs  02/08/12 0530 02/09/12 0600  NA 139 138  K 3.8 4.1  CL 103 107  CO2 28 25  GLUCOSE 129* 111*  BUN 9 7  CREATININE 0.72 0.70  CALCIUM 8.4 8.5   PT/INR  Recent Labs  02/07/12 1656  LABPROT 16.1*  INR 1.32   ABG No results found for this basename: PHART, PCO2, PO2, HCO3,  in the last 72 hours  Studies/Results: No results found.  Anti-infectives: Anti-infectives   Start     Dose/Rate Route Frequency Ordered Stop   02/07/12 1800  ertapenem (INVANZ) 1 g in sodium chloride 0.9 % 50 mL IVPB     1 g 100 mL/hr over 30 Minutes Intravenous Every 24 hours 02/07/12 1646        Assessment/Plan: s/p * No surgery found * Continue IV abx. Consider switching to oral abx soon Low Residue diet CXR  LOS: 3 days    TOTH III,Ivyrose Hashman S 02/10/2012

## 2012-02-10 NOTE — Progress Notes (Signed)
Pt amb 250 ft in [place] with walker. Pt did not have any complaints. Will continue to monitor pt closely.

## 2012-02-11 LAB — CBC
HCT: 35 % — ABNORMAL LOW (ref 36.0–46.0)
Hemoglobin: 11.7 g/dL — ABNORMAL LOW (ref 12.0–15.0)
MCH: 31 pg (ref 26.0–34.0)
MCHC: 33.4 g/dL (ref 30.0–36.0)
MCV: 92.6 fL (ref 78.0–100.0)
Platelets: 264 10*3/uL (ref 150–400)
RBC: 3.78 MIL/uL — ABNORMAL LOW (ref 3.87–5.11)
RDW: 15.4 % (ref 11.5–15.5)
WBC: 4.9 10*3/uL (ref 4.0–10.5)

## 2012-02-11 MED ORDER — AMOXICILLIN-POT CLAVULANATE 500-125 MG PO TABS
1.0000 | ORAL_TABLET | Freq: Three times a day (TID) | ORAL | Status: DC
  Administered 2012-02-11 – 2012-02-12 (×4): 500 mg via ORAL
  Filled 2012-02-11 (×7): qty 1

## 2012-02-11 NOTE — Progress Notes (Signed)
Patient ID: [name], female   DOB: 11/24/1925, 79 y.o.   MRN: [id number]    Subjective: Better today, tolerating diet, +bm and flatus, no pain at this time  Objective: Vital signs in last 24 hours: Temp:  [98.2 F (36.8 C)-98.6 F (37 C)] 98.4 F (36.9 C) (02/10 0435) Pulse Rate:  [76-97] 97 (02/10 0435) Resp:  [16-22] 22 (02/10 0435) BP: (118-153)/(71-78) 153/78 mmHg (02/10 0435) SpO2:  [93 %-96 %] 96 % (02/10 0828) Last BM Date: 02/10/12  Intake/Output from previous day: 02/09 0701 - 02/10 0700 In: 480 [P.O.:480] Out: -  Intake/Output this shift:    Resp: diminished breath sounds bilaterally GI: soft, min tenderness, +bs  Lab Results:   Recent Labs  02/09/12 0600  WBC 6.1  HGB 11.2*  HCT 34.7*  PLT 281   BMET  Recent Labs  02/09/12 0600  NA 138  K 4.1  CL 107  CO2 25  GLUCOSE 111*  BUN 7  CREATININE 0.70  CALCIUM 8.5   PT/INR No results found for this basename: LABPROT, INR,  in the last 72 hours ABG No results found for this basename: PHART, PCO2, PO2, HCO3,  in the last 72 hours  Studies/Results: Dg Chest 2 View  02/10/2012  *RADIOLOGY REPORT*  Clinical Data: Shortness of breath  CHEST - 2 VIEW  Comparison: Chest x-ray of 01/17/2012  Findings: The cardiomegaly is stable.  There is a question of mild pulmonary vascular congestion.  The lungs are hyperaerated consistent with emphysema.  Also, on the lateral view there are slightly prominent markings posteriorly, and a patchy pneumonia cannot be excluded.  Follow-up chest x-ray is recommended.  IMPRESSION:  1.  Cardiomegaly and probable mild pulmonary vascular congestion. 2.  Slightly prominent markings on the lateral view posteriorly. Recommend follow-up to exclude early pneumonia if clinically warranted. 3.  Emphysema.   Original Report Authenticated By: Dwyane Dee, M.D.     Anti-infectives: Anti-infectives   Start     Dose/Rate Route Frequency Ordered Stop   02/07/12 1800  ertapenem (INVANZ) 1 g  in sodium chloride 0.9 % 50 mL IVPB     1 g 100 mL/hr over 30 Minutes Intravenous Every 24 hours 02/07/12 1646        Assessment/Plan: 1. Diverticulitis with Microperforation: tolerating diet, really no pain at this point, will recheck CBC today and likely d/c IV abx and start PO, poss home tomorrow if no problems with PO abx.   LOS: 4 days    WHITE, ELIZABETH 02/11/2012  Looks good.  Symptoms resolved.  Agree with above. Son, [name], in room.  Ovidio Kin, MD, [facility] Surgery Pager: [phone] Office phone:  [phone]

## 2012-02-11 NOTE — Care Management Note (Addendum)
    Page 1 of 1   02/12/2012     11:17:28 AM   CARE MANAGEMENT NOTE 02/12/2012  Patient:  [name], [name]   Account Number:  [id number]  Date Initiated:  02/11/2012  Documentation initiated by:  Hanalei Glace  Subjective/Objective Assessment:   PT ADM WITH ABD PAIN, DIVERTICULITIS ON 02/07/12.  PTA, PT INDEPENDENT, LIVES ALONE.     Action/Plan:   MET WITH PT AND FAMILY TO DISCUSS DC PLANS.  WILL ARRANGE HH CARE; REFERRAL TO [facility], PER PT CHOICE.  START OF CARE 24-48H POST DC DATE. PT HAS RW AND BSC AT HOME.   Anticipated DC Date:  02/12/2012   Anticipated DC Plan:  HOME W HOME HEALTH SERVICES      DC Planning Services  CM consult      [facility] Choice  HOME HEALTH   Choice offered to / List presented to:  C-1 Patient        HH arranged  HH-1 RN  HH-2 PT      [facility] agency  [facility]   Status of service:  Completed, signed off Medicare Important Message given?   (If response is "NO", the following Medicare IM given date fields will be blank) Date Medicare IM given:   Date Additional Medicare IM given:    Discharge Disposition:  HOME W HOME HEALTH SERVICES  Per UR Regulation:  Reviewed for med. necessity/level of care/duration of stay  If discussed at Long Length of Stay Meetings, dates discussed:    Comments:  02/12/12 Ilya Ess,RN,BSN [phone] PT FOR DC TODAY.  NOTIFIED [facility] OF DC DATE.

## 2012-02-12 MED ORDER — HYDROCODONE-ACETAMINOPHEN 5-325 MG PO TABS: 1.0000 | ORAL_TABLET | Freq: Four times a day (QID) | ORAL | Status: DC | PRN

## 2012-02-12 MED ORDER — AMOXICILLIN-POT CLAVULANATE 500-125 MG PO TABS: 1.0000 | ORAL_TABLET | Freq: Three times a day (TID) | ORAL | Status: DC

## 2012-02-12 MED ORDER — ONDANSETRON HCL 4 MG PO TABS: 4.0000 mg | ORAL_TABLET | Freq: Three times a day (TID) | ORAL | Status: DC | PRN

## 2012-02-12 NOTE — Discharge Summary (Signed)
  Physician Discharge Summary  Patient ID: [name] MRN: [id number] DOB/AGE: Jun 03, 1925 79 y.o.  Admit date: 02/07/2012 Discharge date: 02/12/2012  Admitting Diagnosis: Abdominal pain Diverticulitis with a small pericolonic abscess  Discharge Diagnosis Patient Active Problem List   Diagnosis Date Noted  . Pulmonary nodules 01/23/2012  . Diverticulitis of colon with perforation 01/14/2012  . Pneumatosis of intestines 01/14/2012  . Intra-abdominal abscess 01/14/2012  . Hematuria, microscopic 01/14/2012  . Hyponatremia 01/14/2012  . UTI (lower urinary tract infection) 01/03/2012  . Asymptomatic PVCs 01/03/2012  . H/O: hypothyroidism 01/03/2012  . Leukocytosis 01/03/2012  . COPD (chronic obstructive pulmonary disease) 01/02/2012  . CAP (community acquired pneumonia) 01/02/2012  . Hypertension 01/02/2012  . CHF (congestive heart failure) 01/02/2012  . Hx of goiter 01/02/2012    Consultants None  Procedures None  Hospital Course:  79 yr old female with a history of diverticulitis with Interloop pelvic abscess who presented to our office with 48 hours worth of increasing abdominal pain while on abx.  She was admitted and placed on IV abx.  A CT scan showed a small pericolonic abscess that was too small to drain.  She was kept on bowel rest and IV abx until her pain resolved.  Then her diet was advanced and she was converted to PO abx.  She did well with this and was therefore felt stable for discharge home.  She will follow up with Dr. Magnus Ivan in 2-3 weeks.  Physical Exam at discharge: VSS, Afebrile General: Awake, alert, NAD Abd: soft, nontender, +BS Heart: RRR Lungs: CTA bilateral    Medication List    TAKE these medications       albuterol-ipratropium 18-103 MCG/ACT inhaler  Commonly known as:  COMBIVENT  Inhale 2 puffs into the lungs every 6 (six) hours as needed. For shortness of breath     amoxicillin-clavulanate 500-125 MG per tablet  Commonly known as:   AUGMENTIN  Take 1 tablet (500 mg total) by mouth 3 (three) times daily.     aspirin 81 MG chewable tablet  Chew 81 mg by mouth daily.     furosemide 20 MG tablet  Commonly known as:  LASIX  Take 20 mg by mouth daily.     lisinopril 10 MG tablet  Commonly known as:  PRINIVIL,ZESTRIL  Take 10 mg by mouth.     metroNIDAZOLE 500 MG tablet  Commonly known as:  FLAGYL  Take 500 mg by mouth 3 (three) times daily.     ondansetron 4 MG tablet  Commonly known as:  ZOFRAN  Take 1 tablet (4 mg total) by mouth every 8 (eight) hours as needed for nausea.     Vitamin D (Ergocalciferol) 50000 UNITS Caps  Commonly known as:  DRISDOL  Take 50,000 Units by mouth every 14 (fourteen) days.             Follow-up Information   Follow up with [facility], MD In 3 weeks. (2-3 weeks with for hospital follow up)    Contact information:   [address] [place] [address] [phone]       Signed: Denny Levy [facility] [phone]  02/12/2012, 8:17 AM  Agree with above.  Ovidio Kin, MD, [facility] Surgery Pager: [phone] Office phone:  [phone]

## 2012-02-12 NOTE — Progress Notes (Signed)
02/12/2012 11:08 AM Nursing note Discharge avs form, rx, medications already taken today and those due this evening given and explained to patient and family member. Follow up appointments and when to cal MD reviewed. Questions and concerns addressed. D/c iv line by NT [facility]. D/c home per orders with son and HHRN/PT with [facility].  Rosene Pilling, Blanchard Kelch

## 2012-02-13 ENCOUNTER — Encounter (INDEPENDENT_AMBULATORY_CARE_PROVIDER_SITE_OTHER): Payer: Medicare Other | Admitting: General Surgery

## 2012-03-04 ENCOUNTER — Encounter (INDEPENDENT_AMBULATORY_CARE_PROVIDER_SITE_OTHER): Payer: Medicare Other | Admitting: Surgery

## 2012-03-04 ENCOUNTER — Encounter (INDEPENDENT_AMBULATORY_CARE_PROVIDER_SITE_OTHER): Payer: Medicare Other

## 2012-03-07 ENCOUNTER — Inpatient Hospital Stay (HOSPITAL_COMMUNITY)
Admission: EM | Admit: 2012-03-07 | Discharge: 2012-03-31 | DRG: 329 | Disposition: A | Discharge status: Skilled Nursing Facility | Payer: Medicare Other | Attending: Internal Medicine | Admitting: Internal Medicine

## 2012-03-07 ENCOUNTER — Encounter (HOSPITAL_COMMUNITY): Payer: Self-pay | Admitting: *Deleted

## 2012-03-07 ENCOUNTER — Emergency Department (HOSPITAL_COMMUNITY): Payer: Medicare Other

## 2012-03-07 ENCOUNTER — Telehealth (INDEPENDENT_AMBULATORY_CARE_PROVIDER_SITE_OTHER): Payer: Self-pay

## 2012-03-07 DIAGNOSIS — R4182 Altered mental status, unspecified: Secondary | ICD-10-CM | POA: Diagnosis present

## 2012-03-07 DIAGNOSIS — Z87891 Personal history of nicotine dependence: Secondary | ICD-10-CM

## 2012-03-07 DIAGNOSIS — Y849 Medical procedure, unspecified as the cause of abnormal reaction of the patient, or of later complication, without mention of misadventure at the time of the procedure: Secondary | ICD-10-CM | POA: Diagnosis not present

## 2012-03-07 DIAGNOSIS — K6389 Other specified diseases of intestine: Secondary | ICD-10-CM

## 2012-03-07 DIAGNOSIS — J4489 Other specified chronic obstructive pulmonary disease: Secondary | ICD-10-CM

## 2012-03-07 DIAGNOSIS — N390 Urinary tract infection, site not specified: Secondary | ICD-10-CM | POA: Diagnosis not present

## 2012-03-07 DIAGNOSIS — M129 Arthropathy, unspecified: Secondary | ICD-10-CM | POA: Diagnosis present

## 2012-03-07 DIAGNOSIS — Z79899 Other long term (current) drug therapy: Secondary | ICD-10-CM

## 2012-03-07 DIAGNOSIS — G929 Unspecified toxic encephalopathy: Secondary | ICD-10-CM | POA: Diagnosis not present

## 2012-03-07 DIAGNOSIS — K5732 Diverticulitis of large intestine without perforation or abscess without bleeding: Principal | ICD-10-CM

## 2012-03-07 DIAGNOSIS — Z7982 Long term (current) use of aspirin: Secondary | ICD-10-CM

## 2012-03-07 DIAGNOSIS — J189 Pneumonia, unspecified organism: Secondary | ICD-10-CM

## 2012-03-07 DIAGNOSIS — E440 Moderate protein-calorie malnutrition: Secondary | ICD-10-CM | POA: Diagnosis present

## 2012-03-07 DIAGNOSIS — K5730 Diverticulosis of large intestine without perforation or abscess without bleeding: Secondary | ICD-10-CM | POA: Diagnosis present

## 2012-03-07 DIAGNOSIS — R109 Unspecified abdominal pain: Secondary | ICD-10-CM

## 2012-03-07 DIAGNOSIS — I509 Heart failure, unspecified: Secondary | ICD-10-CM

## 2012-03-07 DIAGNOSIS — I5022 Chronic systolic (congestive) heart failure: Secondary | ICD-10-CM | POA: Diagnosis present

## 2012-03-07 DIAGNOSIS — R410 Disorientation, unspecified: Secondary | ICD-10-CM

## 2012-03-07 DIAGNOSIS — K929 Disease of digestive system, unspecified: Secondary | ICD-10-CM | POA: Diagnosis not present

## 2012-03-07 DIAGNOSIS — R5381 Other malaise: Secondary | ICD-10-CM | POA: Diagnosis present

## 2012-03-07 DIAGNOSIS — R1310 Dysphagia, unspecified: Secondary | ICD-10-CM | POA: Diagnosis present

## 2012-03-07 DIAGNOSIS — K560 Paralytic ileus: Secondary | ICD-10-CM | POA: Diagnosis not present

## 2012-03-07 DIAGNOSIS — E039 Hypothyroidism, unspecified: Secondary | ICD-10-CM | POA: Diagnosis present

## 2012-03-07 DIAGNOSIS — J449 Chronic obstructive pulmonary disease, unspecified: Secondary | ICD-10-CM

## 2012-03-07 DIAGNOSIS — K651 Peritoneal abscess: Secondary | ICD-10-CM | POA: Diagnosis present

## 2012-03-07 DIAGNOSIS — I10 Essential (primary) hypertension: Secondary | ICD-10-CM

## 2012-03-07 DIAGNOSIS — J690 Pneumonitis due to inhalation of food and vomit: Secondary | ICD-10-CM | POA: Diagnosis not present

## 2012-03-07 LAB — CBC WITH DIFFERENTIAL/PLATELET
Basophils Absolute: 0 10*3/uL (ref 0.0–0.1)
Basophils Relative: 1 % (ref 0–1)
Eosinophils Absolute: 0.1 10*3/uL (ref 0.0–0.7)
Eosinophils Relative: 1 % (ref 0–5)
HCT: 36.3 % (ref 36.0–46.0)
Hemoglobin: 12.4 g/dL (ref 12.0–15.0)
MCH: 30 pg (ref 26.0–34.0)
MCHC: 34.2 g/dL (ref 30.0–36.0)
Monocytes Absolute: 0.8 10*3/uL (ref 0.1–1.0)
Monocytes Relative: 13 % — ABNORMAL HIGH (ref 3–12)
RDW: 13.5 % (ref 11.5–15.5)

## 2012-03-07 LAB — COMPREHENSIVE METABOLIC PANEL
AST: 14 U/L (ref 0–37)
Albumin: 3.3 g/dL — ABNORMAL LOW (ref 3.5–5.2)
BUN: 10 mg/dL (ref 6–23)
Calcium: 9.5 mg/dL (ref 8.4–10.5)
Creatinine, Ser: 0.63 mg/dL (ref 0.50–1.10)
Total Bilirubin: 0.9 mg/dL (ref 0.3–1.2)
Total Protein: 7 g/dL (ref 6.0–8.3)

## 2012-03-07 LAB — CBC
HCT: 33.7 % — ABNORMAL LOW (ref 36.0–46.0)
Hemoglobin: 12.2 g/dL (ref 12.0–15.0)
MCH: 32.7 pg (ref 26.0–34.0)
MCHC: 36.2 g/dL — ABNORMAL HIGH (ref 30.0–36.0)
MCV: 90.3 fL (ref 78.0–100.0)
RDW: 13.7 % (ref 11.5–15.5)

## 2012-03-07 LAB — URINALYSIS, ROUTINE W REFLEX MICROSCOPIC
Bilirubin Urine: NEGATIVE
Glucose, UA: NEGATIVE mg/dL
Ketones, ur: 15 mg/dL — AB
Protein, ur: 30 mg/dL — AB
pH: 6.5 (ref 5.0–8.0)

## 2012-03-07 LAB — URINE MICROSCOPIC-ADD ON

## 2012-03-07 MED ORDER — SODIUM CHLORIDE 0.9 % IV SOLN: 1.0000 g | Freq: Once | INTRAVENOUS | Status: DC

## 2012-03-07 MED ORDER — IOHEXOL 300 MG/ML  SOLN
80.0000 mL | Freq: Once | INTRAMUSCULAR | Status: AC | PRN
  Administered 2012-03-07: 80 mL via INTRAVENOUS

## 2012-03-07 MED ORDER — FUROSEMIDE 20 MG PO TABS
20.0000 mg | ORAL_TABLET | Freq: Every day | ORAL | Status: DC
  Administered 2012-03-07: 20 mg via ORAL
  Filled 2012-03-07 (×2): qty 1

## 2012-03-07 MED ORDER — HYDROMORPHONE HCL PF 1 MG/ML IJ SOLN
0.5000 mg | Freq: Once | INTRAMUSCULAR | Status: AC
  Administered 2012-03-07: 0.5 mg via INTRAVENOUS
  Filled 2012-03-07: qty 1

## 2012-03-07 MED ORDER — ENOXAPARIN SODIUM 40 MG/0.4ML ~~LOC~~ SOLN
40.0000 mg | SUBCUTANEOUS | Status: DC
  Administered 2012-03-07 – 2012-03-10 (×4): 40 mg via SUBCUTANEOUS
  Filled 2012-03-07 (×6): qty 0.4

## 2012-03-07 MED ORDER — HYDROCODONE-ACETAMINOPHEN 5-325 MG PO TABS
1.0000 | ORAL_TABLET | Freq: Every day | ORAL | Status: DC
  Administered 2012-03-07 – 2012-03-11 (×3): 1 via ORAL
  Filled 2012-03-07 (×4): qty 1

## 2012-03-07 MED ORDER — ONDANSETRON HCL 4 MG/2ML IJ SOLN
4.0000 mg | Freq: Four times a day (QID) | INTRAMUSCULAR | Status: DC | PRN
  Administered 2012-03-08 – 2012-03-11 (×6): 4 mg via INTRAVENOUS
  Filled 2012-03-07 (×13): qty 2

## 2012-03-07 MED ORDER — IPRATROPIUM-ALBUTEROL 18-103 MCG/ACT IN AERO
2.0000 | INHALATION_SPRAY | Freq: Four times a day (QID) | RESPIRATORY_TRACT | Status: DC | PRN
  Filled 2012-03-07: qty 14.7

## 2012-03-07 MED ORDER — SODIUM CHLORIDE 0.9 % IV SOLN
1.0000 g | INTRAVENOUS | Status: AC
  Administered 2012-03-07: 1 g via INTRAVENOUS
  Filled 2012-03-07: qty 1

## 2012-03-07 MED ORDER — ONDANSETRON HCL 4 MG PO TABS
4.0000 mg | ORAL_TABLET | Freq: Four times a day (QID) | ORAL | Status: DC | PRN
  Administered 2012-03-11: 4 mg via ORAL
  Filled 2012-03-07: qty 1

## 2012-03-07 MED ORDER — LISINOPRIL 10 MG PO TABS
10.0000 mg | ORAL_TABLET | Freq: Every day | ORAL | Status: DC
  Administered 2012-03-07 – 2012-03-15 (×6): 10 mg via ORAL
  Filled 2012-03-07 (×9): qty 1

## 2012-03-07 MED ORDER — ASPIRIN 81 MG PO CHEW
81.0000 mg | CHEWABLE_TABLET | Freq: Every day | ORAL | Status: DC
  Administered 2012-03-07 – 2012-03-11 (×5): 81 mg via ORAL
  Filled 2012-03-07 (×7): qty 1

## 2012-03-07 MED ORDER — IOHEXOL 300 MG/ML  SOLN
25.0000 mL | INTRAMUSCULAR | Status: AC
Start: 2012-03-07 — End: 2012-03-07
  Administered 2012-03-07: 25 mL via ORAL

## 2012-03-07 MED ORDER — HYDROMORPHONE HCL PF 1 MG/ML IJ SOLN
0.5000 mg | INTRAMUSCULAR | Status: DC | PRN
  Administered 2012-03-08 (×2): 1 mg via INTRAVENOUS
  Administered 2012-03-09 – 2012-03-10 (×2): 0.5 mg via INTRAVENOUS
  Administered 2012-03-10 – 2012-03-11 (×2): 1 mg via INTRAVENOUS
  Administered 2012-03-11: 0.5 mg via INTRAVENOUS
  Filled 2012-03-07 (×7): qty 1

## 2012-03-07 NOTE — Consult Note (Signed)
Patient interviewed and examined, agree with PA note above.  Mariella Saa MD, FACS  03/07/2012 5:40 PM

## 2012-03-07 NOTE — ED Provider Notes (Signed)
Assumed care from Johnnette Gourd at 1500.  Please see her note for full details.  In short, [name] is a 79 y.o. female who presented to the emergency department for abdominal pain.  Recent admission at beginning of February for diverticulitis with small pericolonic abscess that was treated medically.  Spoke with [facility] and repeat workup performed.  After labs and imaging completed, [facility] wishing for medical admission and ertapenim.  Hospitalists consulted for admission. Patient safe for admission to floor with normal vital signs. Patient admitted.  Arloa Koh, MD 03/07/12 1757

## 2012-03-07 NOTE — ED Provider Notes (Signed)
History     CSN: [id number]  Arrival date & time 03/07/12  1235   First MD Initiated Contact with Patient 03/07/12 1238      Chief Complaint  Patient presents with  . Abdominal Pain    (Consider location/radiation/quality/duration/timing/severity/associated sxs/prior treatment) HPI Comments: 79 year old female presents to the emergency department with her daughter complaining of lower abdominal pain worse on the left x2 days. Patient was discharged from the hospital on February 11 after being treated with IV antibiotics for a small pericolonic abscess that was too small to drain. She was discharged with Augmentin and Flagyl which she completed the full course. Since discharge she has been feeling fine, however 2 days ago she began having intermittent bouts of pain worse after eating. States after she she gets a sharp pain on the left side of her lower abdomen radiating throughout the entire abdomen. She also feels bloated, however denies visible distention. Daughter denies visible distention as well. States she has normal bowel movements, however occasionally becomes constipated from the Norco. After taking a laxative she is able to have a normal bowel movement. Denies pain, diarrhea or bloody stools. Denies fever, chills, nausea or vomiting. She is trying not to eat as much so that she does not experience the pain. She called [facility] [place] [facility] today and was advised to come to the ED. Also complaining of increased urinary frequency and urgency. Denies dysuria or hematuria.   Patient is a 79 y.o. female presenting with abdominal pain. The history is provided by the patient and a relative.  Abdominal Pain Associated symptoms: no chills, no dysuria, no fever, no hematuria, no nausea and no vomiting     Past Medical History  Diagnosis Date  . Hypertension   . Goiter     radioactive iodine ablation/notes 01/02/2012  . COPD (chronic obstructive pulmonary disease)   . CHF (congestive  heart failure)   . Pneumonia ?01/02/2012; ? 2009  . SOB (shortness of breath)     'all the time" (01/02/2012)  . Hypothyroidism     "I have taken Synthroid before" (01/02/2012)  . Arthritis     "right shoulder" (01/02/2012)  . Diverticulitis     Past Surgical History  Procedure Laterality Date  . Cataract extraction w/ intraocular lens  implant, bilateral  ?1990's  . Appendectomy  1980's    "when they did hysterectomy" (01/02/2012)  . Abdominal hysterectomy  1980's    Family History  Problem Relation Age of Onset  . Heart disease    . Cancer Sister     Breast    History  Substance Use Topics  . Smoking status: Former Smoker -- 0.12 packs/day for 35 years    Types: Cigarettes    Quit date: 01/02/1991  . Smokeless tobacco: Never Used  . Alcohol Use: No    OB History   Grav Para Term Preterm Abortions TAB SAB Ect Mult Living                  Review of Systems  Constitutional: Negative for fever and chills.  Gastrointestinal: Positive for abdominal pain. Negative for nausea, vomiting, blood in stool and abdominal distention.  Genitourinary: Positive for urgency and frequency. Negative for dysuria and hematuria.  All other systems reviewed and are negative.    Allergies  Indomethacin  Home Medications   Current Outpatient Rx  Name  Route  Sig  Dispense  Refill  . albuterol-ipratropium (COMBIVENT) 18-103 MCG/ACT inhaler   Inhalation   Inhale  2 puffs into the lungs every 6 (six) hours as needed. For shortness of breath         . amoxicillin-clavulanate (AUGMENTIN) 500-125 MG per tablet   Oral   Take 1 tablet (500 mg total) by mouth 3 (three) times daily.   27 tablet   0   . aspirin 81 MG chewable tablet   Oral   Chew 81 mg by mouth daily.         . furosemide (LASIX) 20 MG tablet   Oral   Take 20 mg by mouth daily.         Marland Kitchen HYDROcodone-acetaminophen (NORCO) 5-325 MG per tablet   Oral   Take 1 tablet by mouth every 6 (six) hours as needed for pain.    30 tablet   0   . lisinopril (PRINIVIL,ZESTRIL) 10 MG tablet   Oral   Take 10 mg by mouth.         . metroNIDAZOLE (FLAGYL) 500 MG tablet   Oral   Take 500 mg by mouth 3 (three) times daily.         . ondansetron (ZOFRAN) 4 MG tablet   Oral   Take 1 tablet (4 mg total) by mouth every 8 (eight) hours as needed for nausea.   20 tablet   0   . Vitamin D, Ergocalciferol, (DRISDOL) 50000 UNITS CAPS   Oral   Take 50,000 Units by mouth every 14 (fourteen) days.           BP 144/95  Pulse 73  Temp(Src) 97.5 F (36.4 C) (Oral)  Resp 20  SpO2 97%  Physical Exam  Nursing note and vitals reviewed. Constitutional: She is oriented to person, place, and time. She appears well-developed and well-nourished. No distress.  HENT:  Head: Normocephalic and atraumatic.  Mouth/Throat: Oropharynx is clear and moist.  Eyes: Conjunctivae and EOM are normal. Pupils are equal, round, and reactive to light.  Neck: Normal range of motion. Neck supple.  Cardiovascular: Normal rate, regular rhythm and normal heart sounds.   Pulmonary/Chest: Effort normal and breath sounds normal. No respiratory distress.  Abdominal: Soft. Normal appearance and bowel sounds are normal. She exhibits no distension and no mass. There is tenderness (to very light palpation, worse lower abdominal region and on left). There is rigidity (firm abdomen felt under thick skin) and guarding. There is no rebound.  Musculoskeletal: Normal range of motion. She exhibits no edema.  Neurological: She is alert and oriented to person, place, and time.  Skin: Skin is warm and dry. No erythema.  Psychiatric: She has a normal mood and affect. Her behavior is normal.    ED Course  Procedures (including critical care time)  Labs Reviewed - No data to display No results found.   No diagnosis found.    MDM  I spoke with Dr. Johna Sheriff with [facility] who would like repeat labs and CT scan. Case discussed with resident Arloa Koh at  shift change who will take over care of patient at this time.        Trevor Mace, PA-C 03/07/12 1511

## 2012-03-07 NOTE — H&P (Addendum)
[facility] History and Physical  [name] ZOX:[id number] DOB: 14-Nov-1925 DOA: 03/07/2012  PCP: Kaleen Mask, MD  Specialists: Dr Johna Sheriff  Chief Complaint: abd pain since one week.   HPI: [name] is a 79 y.o. female with prior h/o copd, hypertension, recent admission for diverticulitis, sent home on oral antibiotics , comes back with abd pain since one week, not associated with nausea or vomiting. No diarrhea or constipation. Denies fever. On arrival she under ct abd and pelvis was found to have persistent diverticulitis but improvement int he size of the tiny abscess. Surgery was consulted by ED, recommended IV antibiotics and no surgical intervention at this time. She denies any other complaints. She will be admitted to hsopitalist service for further management.   Review of Systems: The patient denies anorexia, fever, weight loss,, vision loss, decreased hearing, hoarseness, chest pain, syncope, dyspnea on exertion, peripheral edema, balance deficits, hemoptysis,melena, hematochezia, severe indigestion/heartburn, hematuria, incontinence, genital sores, muscle weakness, suspicious skin lesions, transient blindness, difficulty walking, depression, unusual weight change, abnormal bleeding, enlarged lymph nodes, angioedema, and breast masses.    Past Medical History  Diagnosis Date  . Hypertension   . Goiter     radioactive iodine ablation/notes 01/02/2012  . COPD (chronic obstructive pulmonary disease)   . CHF (congestive heart failure)   . Pneumonia ?01/02/2012; ? 2009  . SOB (shortness of breath)     'all the time" (01/02/2012)  . Hypothyroidism     "I have taken Synthroid before" (01/02/2012)  . Arthritis     "right shoulder" (01/02/2012)  . Diverticulitis    Past Surgical History  Procedure Laterality Date  . Cataract extraction w/ intraocular lens  implant, bilateral  ?1990's  . Appendectomy  1980's    "when they did hysterectomy" (01/02/2012)  . Abdominal  hysterectomy  1980's   Social History:  reports that she quit smoking about 21 years ago. Her smoking use included Cigarettes. She has a 4.2 pack-year smoking history. She has never used smokeless tobacco. She reports that she does not drink alcohol or use illicit drugs.  where does patient live--home, uses walker to ambulate.   Allergies  Allergen Reactions  . Indomethacin Anaphylaxis and Other (See Comments)    "took it fine for awhile; one day I stopped breathing and I ended up in the hospital" (01/02/2012)    Family History  Problem Relation Age of Onset  . Heart disease    . Cancer Sister     Breast     Prior to Admission medications   Medication Sig Start Date End Date Taking? Authorizing Lanetra Hartley  albuterol-ipratropium (COMBIVENT) 18-103 MCG/ACT inhaler Inhale 2 puffs into the lungs every 6 (six) hours as needed for wheezing. For shortness of breath   Yes Historical Romario Tith, MD  amoxicillin-clavulanate (AUGMENTIN) 500-125 MG per tablet Take 1 tablet (500 mg total) by mouth 3 (three) times daily. 02/12/12  Yes Doristine Mango, PA-C  aspirin 81 MG chewable tablet Chew 81 mg by mouth daily.   Yes Historical Jaylinn Hellenbrand, MD  HYDROcodone-acetaminophen (NORCO) 5-325 MG per tablet Take 1 tablet by mouth every 6 (six) hours as needed for pain. 02/12/12  Yes Doristine Mango, PA-C  lisinopril (PRINIVIL,ZESTRIL) 10 MG tablet Take 10 mg by mouth.   Yes Historical Ark Agrusa, MD  metroNIDAZOLE (FLAGYL) 500 MG tablet Take 500 mg by mouth 3 (three) times daily.   Yes Historical Zaniel Marineau, MD  furosemide (LASIX) 20 MG tablet Take 20 mg by mouth daily.    Historical  Damonica Chopra, MD  Vitamin D, Ergocalciferol, (DRISDOL) 50000 UNITS CAPS Take 50,000 Units by mouth every 14 (fourteen) days.    Historical Krisanne Lich, MD   Physical Exam: Filed Vitals:   03/07/12 1243 03/07/12 1735  BP: 144/95 151/81  Pulse: 73 79  Temp: 97.5 F (36.4 C)   TempSrc: Oral   Resp: 20 16  SpO2: 97% 92%    Constitutional:  Vital signs reviewed.  Patient is a well-developed and well-nourished in no acute distress and cooperative with exam. Alert and oriented x3.  Head: Normocephalic and atraumatic Mouth: no erythema or exudates, MMM Eyes: PERRL, EOMI, conjunctivae normal, No scleral icterus.  Neck: Supple, Trachea midline normal ROM, No JVD, mass, thyromegaly, or carotid bruit present.  Cardiovascular: RRR, S1 normal, S2 normal, no MRG, pulses symmetric and intact bilaterally Pulmonary/Chest: CTAB, no wheezes, rales, or rhonchi Abdominal: Soft. Tender generalized, non-distended, bowel sounds are normal, no masses, organomegaly, or guarding present.  Musculoskeletal: No joint deformities, erythema, or stiffness, ROM full and no nontender Neurological: A&O x3, Strength is normal and symmetric bilaterally, cranial nerve II-XII are grossly intact, no focal motor deficit, sensory intact to light touch bilaterally.  Psychiatric: Normal mood and affect. speech and behavior is normal. Labs on Admission:  Basic Metabolic Panel:  Recent Labs Lab 03/07/12 1319  NA 141  K 3.9  CL 104  CO2 28  GLUCOSE 103*  BUN 10  CREATININE 0.63  CALCIUM 9.5   Liver Function Tests:  Recent Labs Lab 03/07/12 1319  AST 14  ALT 10  ALKPHOS 74  BILITOT 0.9  PROT 7.0  ALBUMIN 3.3*   No results found for this basename: LIPASE, AMYLASE,  in the last 168 hours No results found for this basename: AMMONIA,  in the last 168 hours CBC:  Recent Labs Lab 03/07/12 1319  WBC 6.0  NEUTROABS 4.0  HGB 12.4  HCT 36.3  MCV 87.7  PLT 272   Cardiac Enzymes: No results found for this basename: CKTOTAL, CKMB, CKMBINDEX, TROPONINI,  in the last 168 hours  BNP (last 3 results)  Recent Labs  02/10/12 0850  PROBNP 5743.0*   CBG: No results found for this basename: GLUCAP,  in the last 168 hours  Radiological Exams on Admission: Ct Abdomen Pelvis W Contrast  03/07/2012  *RADIOLOGY REPORT*  Clinical Data: Lower abdominal pain,  history diverticulitis, CHF, COPD  CT ABDOMEN AND PELVIS WITH CONTRAST  Technique:  Multidetector CT imaging of the abdomen and pelvis was performed following the standard protocol during bolus administration of intravenous contrast. Sagittal and coronal MPR images reconstructed from axial data set.  Contrast: 80mL OMNIPAQUE IOHEXOL 300 MG/ML  SOLN Dilute oral contrast.  Comparison: 02/07/2012  Findings: Minimal atelectasis posterior right lower lobe base. Improved atelectasis left lower lobe. Mitral annular calcification noted. Bilateral renal cysts. Liver, spleen, pancreas, with kidneys, and adrenal glands otherwise normal appearance.  Stomach incompletely distended, suboptimal assessment wall thickness. Small umbilical hernia containing fat. Diverticulosis of descending and sigmoid colon. Wall thickening identified at mid sigmoid colon with improved pericolic inflammatory changes compatible with acute diverticulitis. Extraluminal gas collections medial to the thickened sigmoid segment again seen compatible with contained perforation within the mesocolon. The presence of minimal high attenuation within this collection raises the possibility that this collection demonstrates continued communication with the adjacent sigmoid colon lumen.  No discrete drainable fluid/abscess collection identified. No free intraperitoneal air. Small bowel loops and remainder colon showed no additional acute changes. Scattered atherosclerotic calcifications. No mass, adenopathy, free to  free of fluid, or hernia. Bones diffusely demineralized.  IMPRESSION: Persistent acute diverticulitis changes of the sigmoid colon with wall thickening and further improved pericolic infiltrative changes. Persistent visualization of extraluminal gas collections within the sigmoid mesocolon at site of prior diverticular abscess, containing a small amount of central high attenuation which could represent GI contrast and indicate continued communication of  this collection with the sigmoid lumen. Diverticulosis of sigmoid and descending colon.   Original Report Authenticated By: Ulyses Southward, M.D.     EKG: not done  Assessment/Plan Active Problems:    1. Acute diverticulitis:  - medical management with IV antibiotics.  - pain control - gentle fluids - clear liquid diet - bowel rest - surgery on board.    2. Copd: not in exacerbation. Resume home combivent inhaler.   3. Chronic systolic heart failure: appears to be compensated at this time. No pedal edema, on lasix, aspirin and lisinopril . She denies any chest pain or shortness of breath.    4. DVT prophylaxis; lovenox.   5. Hypothyroidism; last TSH from 1/14 is normal and she is not on any thyroid supplements.   6. Hypertension: suboptimal. Resume home medications.    Code Status: full code Family Communication: family at bedside Disposition Plan: possibly home in 2 to 3 days.     [facility] Pager [phone]  If 7PM-7AM, please contact night-coverage [email or web address] Password [email or web address] 03/07/2012, 6:20 PM

## 2012-03-07 NOTE — Telephone Encounter (Signed)
Pt called to ask if she could be seen in our office any sooner than her appt set for March 13.  I let the pt know that Dr. Magnus Ivan is out of the office today.  Pt stated that her pain is severe.  Per Dr. Luisa Hart, pt instructed to go to the ER.

## 2012-03-07 NOTE — Consult Note (Signed)
Reason for Consult: Abdominal pain Referring Physician: Johnnette Gourd PA (ER)  [name] is an 79 y.o. female.  HPI: Patient is an 79 year old female who presents with recurrent abdominal pain. Patient was hospitalized in early January with pneumonia. She returned on 01/14/2012 with abdominal pain and was found to have diverticulitis with an interloop pelvic abscess. She was treated with IV Cipro and Flagyl he was discharged home with improvement. She returned for an office visit on 02/07/12 with recurrent diverticulitis and pain. She was readmitted that time by Dr. Rayburn Ma. She underwent another course of antibiotics using IV Invanz for 5 days and was discharged on 02/12/2012 on Augmentin 500/125. She finish the course of antibiotics and was doing well until last week. She began to have abdominal pain has become progressively worse. It is again diffuse and mostly in the lower abdomen. Our office was closed because of whether  And she was sent to the ER at [facility]. CT scan shows the stomach and incompletely distended there is a small umbilical hernia with fat. There is diverticulosis including the descending and sigmoid colon with wall thickening at the mid sigmoid with some improved. Colonic inflammatory changes. There is an extraluminal gas collection medial to the thickened sigmoid segments seen again compatible with perforation and mesocolon.  We were asked to see in consultation for diverticulitis with perforation.  Past Medical History  Diagnosis Date  . Hypertension   . Goiter       radioactive iodine ablation/notes 01/02/2012 . Hypothyroidism Not on synthyroid-   "I have taken Synthroid before" (01/02/2012)      . COPD (chronic obstructive pulmonary disease)   . CHF (congestive heart failure)   . Pneumonia ?01/02/2012; ? 2009  . SOB (shortness of breath) On going chronic SOB    'all the time" (01/02/2012)  . Arthritis     "right shoulder" (01/02/2012)  . Diverticulitis - Hospitalized  01/2012 and 02/2012 for interloop abscess     Past Surgical History  Procedure Laterality Date  . Cataract extraction w/ intraocular lens  implant, bilateral  ?1990's  . Appendectomy  1980's    "when they did hysterectomy" (01/02/2012)  . Abdominal hysterectomy  1980's    Family History  Problem Relation Age of Onset  . Heart disease    . Cancer Sister     Breast    Social History:  reports that she quit smoking about 21 years ago. Her smoking use included Cigarettes. She has a 4.2 pack-year smoking history. She has never used smokeless tobacco. She reports that she does not drink alcohol or use illicit drugs.  Allergies:  Allergies  Allergen Reactions  . Indomethacin Anaphylaxis and Other (See Comments)    "took it fine for awhile; one day I stopped breathing and I ended up in the hospital" (01/02/2012)    Medications:  Prior to Admission:  (Not in a hospital admission) Scheduled:  Continuous: . ertapenem     PRN: Anti-infectives   Start     Dose/Rate Route Frequency Ordered Stop   03/07/12 1800  ertapenem (INVANZ) 1 g in sodium chloride 0.9 % 50 mL IVPB     1 g 100 mL/hr over 30 Minutes Intravenous To Emergency Dept 03/07/12 1721 03/08/12 1800   03/07/12 1730  ertapenem (INVANZ) 1 g in sodium chloride 0.9 % 50 mL IVPB  Status:  Discontinued     1 g 100 mL/hr over 30 Minutes Intravenous  Once 03/07/12 1716 03/07/12 1717  Results for orders placed during the hospital encounter of 03/07/12 (from the past 48 hour(s))  CBC WITH DIFFERENTIAL     Status: Abnormal   Collection Time    03/07/12  1:19 PM      Result Value Range   WBC 6.0  4.0 - 10.5 K/uL   RBC 4.14  3.87 - 5.11 MIL/uL   Hemoglobin 12.4  12.0 - 15.0 g/dL   HCT 40.9  81.1 - 91.4 %   MCV 87.7  78.0 - 100.0 fL   MCH 30.0  26.0 - 34.0 pg   MCHC 34.2  30.0 - 36.0 g/dL   RDW 78.2  95.6 - 21.3 %   Platelets 272  150 - 400 K/uL   Neutrophils Relative 66  43 - 77 %   Neutro Abs 4.0  1.7 - 7.7 K/uL    Lymphocytes Relative 19  12 - 46 %   Lymphs Abs 1.1  0.7 - 4.0 K/uL   Monocytes Relative 13 (*) 3 - 12 %   Monocytes Absolute 0.8  0.1 - 1.0 K/uL   Eosinophils Relative 1  0 - 5 %   Eosinophils Absolute 0.1  0.0 - 0.7 K/uL   Basophils Relative 1  0 - 1 %   Basophils Absolute 0.0  0.0 - 0.1 K/uL  COMPREHENSIVE METABOLIC PANEL     Status: Abnormal   Collection Time    03/07/12  1:19 PM      Result Value Range   Sodium 141  135 - 145 mEq/L   Potassium 3.9  3.5 - 5.1 mEq/L   Chloride 104  96 - 112 mEq/L   CO2 28  19 - 32 mEq/L   Glucose, Bld 103 (*) 70 - 99 mg/dL   BUN 10  6 - 23 mg/dL   Creatinine, Ser 0.86  0.50 - 1.10 mg/dL   Calcium 9.5  8.4 - 57.8 mg/dL   Total Protein 7.0  6.0 - 8.3 g/dL   Albumin 3.3 (*) 3.5 - 5.2 g/dL   AST 14  0 - 37 U/L   ALT 10  0 - 35 U/L   Alkaline Phosphatase 74  39 - 117 U/L   Total Bilirubin 0.9  0.3 - 1.2 mg/dL   GFR calc non Af Amer 80 (*) >90 mL/min   GFR calc Af Amer >90  >90 mL/min   Comment:            The eGFR has been calculated     using the CKD EPI equation.     This calculation has not been     validated in all clinical     situations.     eGFR's persistently     <90 mL/min signify     possible Chronic Kidney Disease.  URINALYSIS, ROUTINE W REFLEX MICROSCOPIC     Status: Abnormal   Collection Time    03/07/12  2:24 PM      Result Value Range   Color, Urine YELLOW  YELLOW   APPearance CLEAR  CLEAR   Specific Gravity, Urine 1.019  1.005 - 1.030   pH 6.5  5.0 - 8.0   Glucose, UA NEGATIVE  NEGATIVE mg/dL   Hgb urine dipstick NEGATIVE  NEGATIVE   Bilirubin Urine NEGATIVE  NEGATIVE   Ketones, ur 15 (*) NEGATIVE mg/dL   Protein, ur 30 (*) NEGATIVE mg/dL   Urobilinogen, UA 1.0  0.0 - 1.0 mg/dL   Nitrite NEGATIVE  NEGATIVE   Leukocytes, UA SMALL (*)  NEGATIVE  URINE MICROSCOPIC-ADD ON     Status: Abnormal   Collection Time    03/07/12  2:24 PM      Result Value Range   Squamous Epithelial / LPF FEW (*) RARE   WBC, UA 3-6  <3  WBC/hpf   RBC / HPF 0-2  <3 RBC/hpf   Bacteria, UA RARE  RARE    Ct Abdomen Pelvis W Contrast  03/07/2012  *RADIOLOGY REPORT*  Clinical Data: Lower abdominal pain, history diverticulitis, CHF, COPD  CT ABDOMEN AND PELVIS WITH CONTRAST  Technique:  Multidetector CT imaging of the abdomen and pelvis was performed following the standard protocol during bolus administration of intravenous contrast. Sagittal and coronal MPR images reconstructed from axial data set.  Contrast: 80mL OMNIPAQUE IOHEXOL 300 MG/ML  SOLN Dilute oral contrast.  Comparison: 02/07/2012  Findings: Minimal atelectasis posterior right lower lobe base. Improved atelectasis left lower lobe. Mitral annular calcification noted. Bilateral renal cysts. Liver, spleen, pancreas, with kidneys, and adrenal glands otherwise normal appearance.  Stomach incompletely distended, suboptimal assessment wall thickness. Small umbilical hernia containing fat. Diverticulosis of descending and sigmoid colon. Wall thickening identified at mid sigmoid colon with improved pericolic inflammatory changes compatible with acute diverticulitis. Extraluminal gas collections medial to the thickened sigmoid segment again seen compatible with contained perforation within the mesocolon. The presence of minimal high attenuation within this collection raises the possibility that this collection demonstrates continued communication with the adjacent sigmoid colon lumen.  No discrete drainable fluid/abscess collection identified. No free intraperitoneal air. Small bowel loops and remainder colon showed no additional acute changes. Scattered atherosclerotic calcifications. No mass, adenopathy, free to free of fluid, or hernia. Bones diffusely demineralized.  IMPRESSION: Persistent acute diverticulitis changes of the sigmoid colon with wall thickening and further improved pericolic infiltrative changes. Persistent visualization of extraluminal gas collections within the sigmoid  mesocolon at site of prior diverticular abscess, containing a small amount of central high attenuation which could represent GI contrast and indicate continued communication of this collection with the sigmoid lumen. Diverticulosis of sigmoid and descending colon.   Original Report Authenticated By: Ulyses Southward, M.D.     Review of Systems  Constitutional: Positive for weight loss (not sure, appitite is poor. Hurts more when she eats). Negative for fever, chills, malaise/fatigue and diaphoresis.       She can shop and get around using a cart.  It helps her breathing and balance.   HENT: Positive for neck pain.   Eyes: Negative.   Respiratory: Positive for cough, shortness of breath (chronic SOB) and wheezing (daily uses inhaler 1-2 times per day.). Negative for hemoptysis and sputum production.   Cardiovascular: Positive for leg swelling (not while on lasix). Negative for chest pain, palpitations, orthopnea, claudication and PND.  Gastrointestinal: Positive for abdominal pain (mid to lower abdominal pain), constipation (with pain meds.) and blood in stool. Negative for heartburn, nausea, vomiting, diarrhea and melena.  Genitourinary: Negative.   Musculoskeletal: Positive for back pain.       Arthritis all over.   Skin: Negative.   Neurological: Negative.  Negative for weakness.  Endo/Heme/Allergies: Negative.   Psychiatric/Behavioral: Negative.    Blood pressure 144/95, pulse 73, temperature 97.5 F (36.4 C), temperature source Oral, resp. rate 20, SpO2 97.00%. Physical Exam  Constitutional: She is oriented to person, place, and time. She appears well-developed and well-nourished. No distress.  Overweight AAF NAD  HENT:  Head: Normocephalic and atraumatic.  Nose: Nose normal.  Eyes: Conjunctivae and EOM are normal.  Pupils are equal, round, and reactive to light. Right eye exhibits discharge. Left eye exhibits no discharge.  Neck: Normal range of motion. Neck supple. No JVD present. No  tracheal deviation present. Thyromegaly (She has a palpable goiter right neck, she says it was larger than that before radioiodine.) present.  Cardiovascular: Normal rate, regular rhythm, normal heart sounds and intact distal pulses.  Exam reveals no gallop.   No murmur heard. Respiratory: Effort normal and breath sounds normal. No respiratory distress. She has no wheezes. She has no rales. She exhibits no tenderness.  GI: Soft. Bowel sounds are normal. She exhibits no distension and no mass. There is tenderness (PAIN IS IN MID AND LOWER ABDOMEN.  diffuse, has more pain with eating.). There is no rebound and no guarding.  Musculoskeletal: Normal range of motion. She exhibits no edema and no tenderness.  Lymphadenopathy:    She has no cervical adenopathy.  Neurological: She is alert and oriented to person, place, and time. No cranial nerve deficit.  Skin: Skin is warm and dry. No rash noted. She is not diaphoretic. No erythema. No pallor.  Psychiatric: She has a normal mood and affect. Her behavior is normal. Judgment and thought content normal.    Assessment/Plan: 1. Recurrent diverticulitis with perforation prior hospitalizations 01/14/12, and 02/07/12 for the same problem. 2. COPD/remote history of tobacco use/chronic wheezing. 3. History of congestive heart failure/hospitalized 6 or more years ago for this. 4. Chronic shortness of breath since January 2014. 5. History of goiter/radioiodine therapy/not currently on Thyroid replacement 6. Hospitalized for pneumonia in January 2014. 7. Arthritis    Plan: We will place her back on IV Invanz, bowel rest which is clear liquids for now and follow with you. To be admitted by medicine Will Marly[facility] for Dr. Johna Sheriff.  JENNINGS,WILLARD 03/07/2012, 5:09 PM

## 2012-03-07 NOTE — ED Notes (Signed)
Pt was d/c'd from [facility] 3 weeks ago after being tx for diverticulitis.  She finished her antibiotics 1 week prior and the pain started again.  Denies nvd.

## 2012-03-08 LAB — BASIC METABOLIC PANEL
BUN: 8 mg/dL (ref 6–23)
CO2: 26 mEq/L (ref 19–32)
Chloride: 102 mEq/L (ref 96–112)
GFR calc Af Amer: >90 mL/min (ref >90)
Glucose, Bld: 85 mg/dL (ref 70–99)
Potassium: 3.8 mEq/L (ref 3.5–5.1)

## 2012-03-08 LAB — CBC
HCT: 35.6 % — ABNORMAL LOW (ref 36.0–46.0)
Hemoglobin: 12.3 g/dL (ref 12.0–15.0)
MCH: 30.7 pg (ref 26.0–34.0)
MCHC: 34.6 g/dL (ref 30.0–36.0)
MCV: 88.8 fL (ref 78.0–100.0)

## 2012-03-08 MED ORDER — PRO-STAT SUGAR FREE PO LIQD
30.0000 mL | Freq: Every day | ORAL | Status: DC
  Administered 2012-03-09 – 2012-03-22 (×9): 30 mL via ORAL
  Filled 2012-03-08 (×18): qty 30

## 2012-03-08 MED ORDER — FUROSEMIDE 20 MG PO TABS: 5.0000 mg | ORAL_TABLET | Freq: Every day | ORAL | Status: DC

## 2012-03-08 MED ORDER — FUROSEMIDE 10 MG/ML PO SOLN
5.0000 mg | Freq: Every day | ORAL | Status: DC
  Administered 2012-03-09 – 2012-03-19 (×9): 5 mg via ORAL
  Filled 2012-03-08 (×11): qty 0.5

## 2012-03-08 MED ORDER — SODIUM CHLORIDE 0.9 % IV SOLN
1.0000 g | INTRAVENOUS | Status: DC
  Administered 2012-03-08 – 2012-03-18 (×11): 1 g via INTRAVENOUS
  Filled 2012-03-08 (×13): qty 1

## 2012-03-08 MED ORDER — BOOST / RESOURCE BREEZE PO LIQD
1.0000 | Freq: Two times a day (BID) | ORAL | Status: DC
  Administered 2012-03-09 – 2012-03-19 (×10): 1 via ORAL

## 2012-03-08 NOTE — Evaluation (Signed)
Physical Therapy Evaluation Patient Details Name: [name] MRN: [id number] DOB: 1925/02/26 Today's Date: 03/08/2012 Time: 4098-1191 PT Time Calculation (min): 25 min  PT Assessment / Plan / Recommendation Clinical Impression  Patient is an 79 yo female admitted with diverticulitis/abscess for antibiotics.  Patient moved fairly well today.  Close to baseline level of functioning, however with LE weakness impacting mobility.  Will benefit from acute PT to maximize independence prior to returning home alone.  Recommend HHPT for home safety assessment and continued therapy.    PT Assessment  Patient needs continued PT services    Follow Up Recommendations  Home health PT;Supervision - Intermittent    Does the patient have the potential to tolerate intense rehabilitation      Barriers to Discharge Decreased caregiver support      Equipment Recommendations  None recommended by PT    Recommendations for Other Services     Frequency Min 3X/week    Precautions / Restrictions Precautions Precautions: None Restrictions Weight Bearing Restrictions: No   Pertinent Vitals/Pain       Mobility  Bed Mobility Bed Mobility: Not assessed Transfers Transfers: Sit to Stand;Stand to Sit Sit to Stand: 4: Min guard;With upper extremity assist;With armrests;From chair/3-in-1 Stand to Sit: 4: Min guard;With upper extremity assist;With armrests;To chair/3-in-1 Details for Transfer Assistance: Patient uses safe technique for transfers. Ambulation/Gait Ambulation/Gait Assistance: 4: Min guard Ambulation Distance (Feet): 186 Feet Assistive device: Rolling walker Ambulation/Gait Assistance Details: Verbal cues to stand upright and keep feet inside RW. Gait Pattern: Step-through pattern;Decreased stride length;Trunk flexed Gait velocity: Slow gait speed    Exercises     PT Diagnosis: Abnormality of gait;Difficulty walking;Generalized weakness;Acute pain  PT Problem List: Decreased  strength;Decreased activity tolerance;Decreased mobility;Decreased knowledge of use of DME;Pain PT Treatment Interventions: DME instruction;Gait training;Stair training;Functional mobility training;Patient/family education   PT Goals Acute Rehab PT Goals PT Goal Formulation: With patient Time For Goal Achievement: 03/15/12 Potential to Achieve Goals: Good Pt will go Supine/Side to Sit: Independently;with HOB 0 degrees PT Goal: Supine/Side to Sit - Progress: Goal set today Pt will go Sit to Supine/Side: Independently;with HOB 0 degrees PT Goal: Sit to Supine/Side - Progress: Goal set today Pt will go Sit to Stand: with modified independence;with upper extremity assist PT Goal: Sit to Stand - Progress: Goal set today Pt will Ambulate: >150 feet;with modified independence;with rolling walker PT Goal: Ambulate - Progress: Goal set today Pt will Go Up / Down Stairs: 1-2 stairs;with supervision;with least restrictive assistive device PT Goal: Up/Down Stairs - Progress: Goal set today  Visit Information  Last PT Received On: 03/08/12 Assistance Needed: +1    Subjective Data  Subjective: "I'm not hurting right now.  I had some pain medicine." Patient Stated Goal: To return home.   Prior Functioning  Home Living Lives With: Alone Available Help at Discharge: Family;Available PRN/intermittently Type of Home: House Home Access: Stairs to enter [employer] of Steps: 2 Entrance Stairs-Rails: None Home Layout: One level Bathroom Shower/Tub: Engineer, manufacturing systems: Handicapped height Bathroom Accessibility: Yes How Accessible: Accessible via walker Home Adaptive Equipment: Dan Humphreys - four wheeled;Shower chair with back;Straight cane Prior Function Level of Independence: Independent with assistive device(s) Able to Take Stairs?: Yes Driving: Yes Vocation: Retired Comments: Recently has had help getting her groceries. Communication Communication: No difficulties     Cognition  Cognition Overall Cognitive Status: Appears within functional limits for tasks assessed/performed Arousal/Alertness: Awake/alert Orientation Level: Appears intact for tasks assessed Behavior During Session: [facility] for tasks  performed    Extremity/Trunk Assessment Right Upper Extremity Assessment RUE ROM/Strength/Tone: Deficits RUE ROM/Strength/Tone Deficits: Decreased shoulder flexion to about 45 degrees; Strength 3+/5 RUE Sensation: WFL - Light Touch Left Upper Extremity Assessment LUE ROM/Strength/Tone: WFL for tasks assessed LUE Sensation: WFL - Light Touch Right Lower Extremity Assessment RLE ROM/Strength/Tone: Deficits RLE ROM/Strength/Tone Deficits: Strength grossly 4-/5 RLE Sensation: WFL - Light Touch RLE Coordination: WFL - gross motor Left Lower Extremity Assessment LLE ROM/Strength/Tone: Deficits LLE ROM/Strength/Tone Deficits: Strength grossly 4-/5 LLE Sensation: WFL - Light Touch LLE Coordination: WFL - gross motor   Balance    End of Session PT - End of Session Equipment Utilized During Treatment: Gait belt Activity Tolerance: Patient tolerated treatment well Patient left: in chair;with call bell/phone within reach Nurse Communication: Mobility status  GP     Vena Austria 03/08/2012, 5:57 PM Durenda Hurt. Renaldo Fiddler, [facility] Acute Rehab Services Pager [phone]

## 2012-03-08 NOTE — Progress Notes (Signed)
Subjective: No real change from last pM, she says the pain medicine helps ,but otherwise no change.  She rubs her left side more than her right when she describes the pain, seems more like left going to right.  She's no sure.  Objective: Vital signs in last 24 hours: Temp:  [97.4 F (36.3 C)-98.2 F (36.8 C)] 98.2 F (36.8 C) (03/08 0623) Pulse Rate:  [63-79] 63 (03/08 0623) Resp:  [16-20] 16 (03/08 0623) BP: (129-151)/(54-95) 146/54 mmHg (03/08 0623) SpO2:  [92 %-99 %] 99 % (03/08 0623) Weight:  [144 lb 14.4 oz (65.726 kg)] 144 lb 14.4 oz (65.726 kg) (03/07 2000) Last BM Date: 03/07/12 Afebrile, VSS, labs OK this AM Intake/Output from previous day: 03/07 0701 - 03/08 0700 In: -  Out: 200 [Urine:200] Intake/Output this shift:    General appearance: alert, cooperative and no distress GI: soft, non-tender; bowel sounds normal; no masses,  no organomegaly and rubs her left side, mid to lower abdomen first then over to the right side.  Lab Results:   Recent Labs  03/07/12 2030 03/08/12 0604  WBC 6.1 4.5  HGB 12.2 12.3  HCT 33.7* 35.6*  PLT 251 264    BMET  Recent Labs  03/07/12 1319 03/07/12 2030 03/08/12 0604  NA 141  --  138  K 3.9  --  3.8  CL 104  --  102  CO2 28  --  26  GLUCOSE 103*  --  85  BUN 10  --  8  CREATININE 0.63 0.60 0.65  CALCIUM 9.5  --  9.3   PT/INR No results found for this basename: LABPROT, INR,  in the last 72 hours   Recent Labs Lab 03/07/12 1319  AST 14  ALT 10  ALKPHOS 74  BILITOT 0.9  PROT 7.0  ALBUMIN 3.3*     Lipase  No results found for this basename: lipase     Studies/Results: Ct Abdomen Pelvis W Contrast  03/07/2012  *RADIOLOGY REPORT*  Clinical Data: Lower abdominal pain, history diverticulitis, CHF, COPD  CT ABDOMEN AND PELVIS WITH CONTRAST  Technique:  Multidetector CT imaging of the abdomen and pelvis was performed following the standard protocol during bolus administration of intravenous contrast. Sagittal  and coronal MPR images reconstructed from axial data set.  Contrast: 80mL OMNIPAQUE IOHEXOL 300 MG/ML  SOLN Dilute oral contrast.  Comparison: 02/07/2012  Findings: Minimal atelectasis posterior right lower lobe base. Improved atelectasis left lower lobe. Mitral annular calcification noted. Bilateral renal cysts. Liver, spleen, pancreas, with kidneys, and adrenal glands otherwise normal appearance.  Stomach incompletely distended, suboptimal assessment wall thickness. Small umbilical hernia containing fat. Diverticulosis of descending and sigmoid colon. Wall thickening identified at mid sigmoid colon with improved pericolic inflammatory changes compatible with acute diverticulitis. Extraluminal gas collections medial to the thickened sigmoid segment again seen compatible with contained perforation within the mesocolon. The presence of minimal high attenuation within this collection raises the possibility that this collection demonstrates continued communication with the adjacent sigmoid colon lumen.  No discrete drainable fluid/abscess collection identified. No free intraperitoneal air. Small bowel loops and remainder colon showed no additional acute changes. Scattered atherosclerotic calcifications. No mass, adenopathy, free to free of fluid, or hernia. Bones diffusely demineralized.  IMPRESSION: Persistent acute diverticulitis changes of the sigmoid colon with wall thickening and further improved pericolic infiltrative changes. Persistent visualization of extraluminal gas collections within the sigmoid mesocolon at site of prior diverticular abscess, containing a small amount of central high attenuation which could represent  GI contrast and indicate continued communication of this collection with the sigmoid lumen. Diverticulosis of sigmoid and descending colon.   Original Report Authenticated By: Ulyses Southward, M.D.     Medications: . aspirin  81 mg Oral Daily  . enoxaparin (LOVENOX) injection  40 mg  Subcutaneous Q24H  . furosemide  20 mg Oral Daily  . HYDROcodone-acetaminophen  1 tablet Oral Daily  . lisinopril  10 mg Oral Daily    Assessment/Plan 1. Recurrent diverticulitis with perforation prior hospitalizations 01/14/12, and 02/07/12 for the same problem.  2. COPD/remote history of tobacco use/chronic wheezing.  3. History of congestive heart failure/hospitalized 6 or more years ago for this.  4. Chronic shortness of breath since January 2014.  5. History of goiter/radioiodine therapy/not currently on Thyroid replacement  6. Hospitalized for pneumonia in January 2014.  7. Arthritis Plan:   Bowel rest, and antibiotics.   LOS: 1 day    JENNINGS,WILLARD 03/08/2012

## 2012-03-08 NOTE — Progress Notes (Signed)
INITIAL NUTRITION ASSESSMENT  DOCUMENTATION CODES Per approved criteria  -Severe malnutrition in the context of chronic illness   INTERVENTION: 1.  Supplements; Resource Breeze BID between meals.  Prostat once daily.  NUTRITION DIAGNOSIS: Unintended wt change related to abdominal pain as evidenced by 30 lbs wt loss in 15 months.   Monitor:  1.  Food/Beverage; diet advancement with tolerance 2.  Wt/wt change; monitor trends  Reason for Assessment: consult  79 y.o. female  Admitting Dx: abdominal pain  ASSESSMENT: Pt admitted with abdominal pain, found to have recurrent flare-up of diverticulitis with small prediverticular abscess which is recurring.  Pt responds well to conservative treatment, which remains current plan, however RD notes ongoing discussion for long-term management to prevent future recurrence. Pt currently on clear liquids with tolerance.   Pt has lost approximately 30 lbs in the last 15 months r/t recurrence.  Pt has lost 10 lbs since last admission ~1 months ago (6.9% wt change in 1 month).  Pt reports she has been eating less as well.  She states she is kept on liquids diet during hospitalizations and then has been able to up her intake to "about a cup or so" at meals.  Pt qualifies for severe malnutrition of chronic illness based on degree of wt loss and poor intake >1 month.   Height: Ht Readings from Last 1 Encounters:  03/07/12 5\' 1"  (1.549 m)    Weight: Wt Readings from Last 1 Encounters:  03/07/12 144 lb 14.4 oz (65.726 kg)    Ideal Body Weight: 105 lbs  % Ideal Body Weight: 137%  Wt Readings from Last 10 Encounters:  03/07/12 144 lb 14.4 oz (65.726 kg)  02/09/12 157 lb 13.6 oz (71.6 kg)  02/07/12 154 lb (69.854 kg)  01/22/12 171 lb 15.3 oz (78 kg)  01/14/12 156 lb (70.761 kg)  01/02/12 155 lb (70.308 kg)  01/02/12 155 lb (70.308 kg)  01/29/11 170 lb (77.111 kg)    Usual Body Weight: 170 lbs  % Usual Body Weight: 84%  BMI:  Body mass  index is 27.39 kg/(m^2).  Estimated Nutritional Needs: Kcal: 1640-1820 Protein: 75-90g Fluid: >1.8 L/day  Skin: intact  Diet Order: Clear Liquid  EDUCATION NEEDS: -Education needs addressed   Intake/Output Summary (Last 24 hours) at 03/08/12 1617 Last data filed at 03/07/12 2000  Gross per 24 hour  Intake      0 ml  Output    200 ml  Net   -200 ml    Last BM: 3/7  Labs:   Recent Labs Lab 03/07/12 1319 03/07/12 2030 03/08/12 0604  NA 141  --  138  K 3.9  --  3.8  CL 104  --  102  CO2 28  --  26  BUN 10  --  8  CREATININE 0.63 0.60 0.65  CALCIUM 9.5  --  9.3  GLUCOSE 103*  --  85    CBG (last 3)  No results found for this basename: GLUCAP,  in the last 72 hours  Scheduled Meds: . aspirin  81 mg Oral Daily  . enoxaparin (LOVENOX) injection  40 mg Subcutaneous Q24H  . ertapenem  1 g Intravenous Q24H  . [START ON 03/09/2012] furosemide  5 mg Oral Daily  . HYDROcodone-acetaminophen  1 tablet Oral Daily  . lisinopril  10 mg Oral Daily    Continuous Infusions:   Past Medical History  Diagnosis Date  . Hypertension   . Goiter     radioactive iodine  ablation/notes 01/02/2012  . COPD (chronic obstructive pulmonary disease)   . CHF (congestive heart failure)   . Pneumonia ?01/02/2012; ? 2009  . SOB (shortness of breath)     'all the time" (01/02/2012)  . Hypothyroidism     "I have taken Synthroid before" (01/02/2012)  . Arthritis     "right shoulder" (01/02/2012)  . Diverticulitis     Past Surgical History  Procedure Laterality Date  . Cataract extraction w/ intraocular lens  implant, bilateral  ?1990's  . Appendectomy  1980's    "when they did hysterectomy" (01/02/2012)  . Abdominal hysterectomy  1980's    Loyce Dys, MS RD LDN Clinical Inpatient Dietitian Pager: [phone] Weekend/After hours pager: [phone]

## 2012-03-08 NOTE — Progress Notes (Signed)
[facility] PROGRESS NOTE  [name]:[id number] DOB: [birth year] DOA: 03/07/2012 PCP: Kaleen Mask, MD  Assessment/Plan: Recurrent acute diverticulitis Medical management with IV antibiotics, ertapenem. Pain under control. Clear liquid diet. Bowel rest. Has had multiple episodes this year already. Surgery on board, planning possible surgery during this hospital stable.   COPD Not in exacerbation. Resume home combivent inhaler. Compensated.  Chronic diastolic heart failure EF 55-60% based on 2D Echo on 02/25/2009. Appears to be compensated at this time. No pedal edema, on lasix, aspirin and lisinopril . She denies any chest pain or shortness of breath.   Hypothyroidism Last TSH from 1/14 is normal and she is not on any thyroid supplements.   Hypertension Suboptimal. Resume home medications.   Prophylaxis Lovenox.   Code Status: Full code. Family Communication: Daughter by bedside. Disposition Plan: Pending per general surgery.   Consultants:  General surgery  Procedures:  As above.  Antibiotics:  Ertapenem 03/07/2012.  HPI/Subjective: Abdominal pain improved. No other specific concerns.  Objective: Filed Vitals:   03/07/12 1735 03/07/12 1921 03/07/12 2000 03/08/12 0623  BP: 151/81 129/76 148/88 146/54  Pulse: 79 72 79 63  Temp:  98 F (36.7 C) 97.4 F (36.3 C) 98.2 F (36.8 C)  TempSrc:  Oral Oral Oral  Resp: 16 18 18 16   Height:   5\' 1"  (1.549 m)   Weight:   65.726 kg (144 lb 14.4 oz)   SpO2: 92% 96% 94% 99%    Intake/Output Summary (Last 24 hours) at 03/08/12 1315 Last data filed at 03/07/12 2000  Gross per 24 hour  Intake      0 ml  Output    200 ml  Net   -200 ml   Filed Weights   03/07/12 2000  Weight: 65.726 kg (144 lb 14.4 oz)    Exam: Physical Exam: General: Awake, Oriented, No acute distress. HEENT: EOMI. Neck: Supple CV: S1 and S2 Lungs: Clear to ascultation bilaterally Abdomen: Soft, tender to deep palpation,  Nondistended, +bowel sounds. Ext: Good pulses. Trace edema.  Data Reviewed: Basic Metabolic Panel:  Recent Labs Lab 03/07/12 1319 03/07/12 2030 03/08/12 0604  NA 141  --  138  K 3.9  --  3.8  CL 104  --  102  CO2 28  --  26  GLUCOSE 103*  --  85  BUN 10  --  8  CREATININE 0.63 0.60 0.65  CALCIUM 9.5  --  9.3   Liver Function Tests:  Recent Labs Lab 03/07/12 1319  AST 14  ALT 10  ALKPHOS 74  BILITOT 0.9  PROT 7.0  ALBUMIN 3.3*   No results found for this basename: LIPASE, AMYLASE,  in the last 168 hours No results found for this basename: AMMONIA,  in the last 168 hours CBC:  Recent Labs Lab 03/07/12 1319 03/07/12 2030 03/08/12 0604  WBC 6.0 6.1 4.5  NEUTROABS 4.0  --   --   HGB 12.4 12.2 12.3  HCT 36.3 33.7* 35.6*  MCV 87.7 90.3 88.8  PLT 272 251 264   Cardiac Enzymes: No results found for this basename: CKTOTAL, CKMB, CKMBINDEX, TROPONINI,  in the last 168 hours BNP (last 3 results)  Recent Labs  02/10/12 0850  PROBNP 5743.0*   CBG: No results found for this basename: GLUCAP,  in the last 168 hours  No results found for this or any previous visit (from the past 240 hour(s)).   Studies: Ct Abdomen Pelvis W Contrast  03/07/2012  *RADIOLOGY  REPORT*  Clinical Data: Lower abdominal pain, history diverticulitis, CHF, COPD  CT ABDOMEN AND PELVIS WITH CONTRAST  Technique:  Multidetector CT imaging of the abdomen and pelvis was performed following the standard protocol during bolus administration of intravenous contrast. Sagittal and coronal MPR images reconstructed from axial data set.  Contrast: 80mL OMNIPAQUE IOHEXOL 300 MG/ML  SOLN Dilute oral contrast.  Comparison: 02/07/2012  Findings: Minimal atelectasis posterior right lower lobe base. Improved atelectasis left lower lobe. Mitral annular calcification noted. Bilateral renal cysts. Liver, spleen, pancreas, with kidneys, and adrenal glands otherwise normal appearance.  Stomach incompletely distended,  suboptimal assessment wall thickness. Small umbilical hernia containing fat. Diverticulosis of descending and sigmoid colon. Wall thickening identified at mid sigmoid colon with improved pericolic inflammatory changes compatible with acute diverticulitis. Extraluminal gas collections medial to the thickened sigmoid segment again seen compatible with contained perforation within the mesocolon. The presence of minimal high attenuation within this collection raises the possibility that this collection demonstrates continued communication with the adjacent sigmoid colon lumen.  No discrete drainable fluid/abscess collection identified. No free intraperitoneal air. Small bowel loops and remainder colon showed no additional acute changes. Scattered atherosclerotic calcifications. No mass, adenopathy, free to free of fluid, or hernia. Bones diffusely demineralized.  IMPRESSION: Persistent acute diverticulitis changes of the sigmoid colon with wall thickening and further improved pericolic infiltrative changes. Persistent visualization of extraluminal gas collections within the sigmoid mesocolon at site of prior diverticular abscess, containing a small amount of central high attenuation which could represent GI contrast and indicate continued communication of this collection with the sigmoid lumen. Diverticulosis of sigmoid and descending colon.   Original Report Authenticated By: Ulyses Southward, M.D.     Scheduled Meds: . aspirin  81 mg Oral Daily  . enoxaparin (LOVENOX) injection  40 mg Subcutaneous Q24H  . [START ON 03/09/2012] furosemide  5 mg Oral Daily  . HYDROcodone-acetaminophen  1 tablet Oral Daily  . lisinopril  10 mg Oral Daily   Continuous Infusions:   Active Problems:   Hypertension   CHF (congestive heart failure)   H/O: hypothyroidism   Intra-abdominal abscess   Diverticulitis of sigmoid colon    Elissia Spiewak A, MD  [facility] Pager [phone]. If 7PM-7AM, please contact night-coverage  at [email or web address], password [facility] 03/08/2012, 1:15 PM  LOS: 1 day

## 2012-03-08 NOTE — Progress Notes (Signed)
Patient ID: [name], female   DOB: 12-Nov-1925, 79 y.o.   MRN: [id number]    Subjective: Having some occasional left lower quadrant pain that has required medications but overall feeling better than admission. Tolerating clear liquids. She had a normal bowel movement.  Objective: Vital signs in last 24 hours: Temp:  [97.4 F (36.3 C)-98.2 F (36.8 C)] 98.2 F (36.8 C) (03/08 0623) Pulse Rate:  [63-79] 63 (03/08 0623) Resp:  [16-20] 16 (03/08 0623) BP: (129-151)/(54-95) 146/54 mmHg (03/08 0623) SpO2:  [92 %-99 %] 99 % (03/08 0623) Weight:  [144 lb 14.4 oz (65.726 kg)] 144 lb 14.4 oz (65.726 kg) (03/07 2000) Last BM Date: 03/07/12  Intake/Output from previous day: 03/07 0701 - 03/08 0700 In: -  Out: 200 [Urine:200] Intake/Output this shift:    General appearance: alert, cooperative and no distress GI: abnormal findings:  mild tenderness in the LLQ and without guarding or mass  Lab Results:   Recent Labs  03/07/12 2030 03/08/12 0604  WBC 6.1 4.5  HGB 12.2 12.3  HCT 33.7* 35.6*  PLT 251 264   BMET  Recent Labs  03/07/12 1319 03/07/12 2030 03/08/12 0604  NA 141  --  138  K 3.9  --  3.8  CL 104  --  102  CO2 28  --  26  GLUCOSE 103*  --  85  BUN 10  --  8  CREATININE 0.63 0.60 0.65  CALCIUM 9.5  --  9.3     Studies/Results: Ct Abdomen Pelvis W Contrast  03/07/2012  *RADIOLOGY REPORT*  Clinical Data: Lower abdominal pain, history diverticulitis, CHF, COPD  CT ABDOMEN AND PELVIS WITH CONTRAST  Technique:  Multidetector CT imaging of the abdomen and pelvis was performed following the standard protocol during bolus administration of intravenous contrast. Sagittal and coronal MPR images reconstructed from axial data set.  Contrast: 80mL OMNIPAQUE IOHEXOL 300 MG/ML  SOLN Dilute oral contrast.  Comparison: 02/07/2012  Findings: Minimal atelectasis posterior right lower lobe base. Improved atelectasis left lower lobe. Mitral annular calcification noted. Bilateral renal  cysts. Liver, spleen, pancreas, with kidneys, and adrenal glands otherwise normal appearance.  Stomach incompletely distended, suboptimal assessment wall thickness. Small umbilical hernia containing fat. Diverticulosis of descending and sigmoid colon. Wall thickening identified at mid sigmoid colon with improved pericolic inflammatory changes compatible with acute diverticulitis. Extraluminal gas collections medial to the thickened sigmoid segment again seen compatible with contained perforation within the mesocolon. The presence of minimal high attenuation within this collection raises the possibility that this collection demonstrates continued communication with the adjacent sigmoid colon lumen.  No discrete drainable fluid/abscess collection identified. No free intraperitoneal air. Small bowel loops and remainder colon showed no additional acute changes. Scattered atherosclerotic calcifications. No mass, adenopathy, free to free of fluid, or hernia. Bones diffusely demineralized.  IMPRESSION: Persistent acute diverticulitis changes of the sigmoid colon with wall thickening and further improved pericolic infiltrative changes. Persistent visualization of extraluminal gas collections within the sigmoid mesocolon at site of prior diverticular abscess, containing a small amount of central high attenuation which could represent GI contrast and indicate continued communication of this collection with the sigmoid lumen. Diverticulosis of sigmoid and descending colon.   Original Report Authenticated By: Ulyses Southward, M.D.     Anti-infectives: Anti-infectives   Start     Dose/Rate Route Frequency Ordered Stop   03/07/12 1800  ertapenem (INVANZ) 1 g in sodium chloride 0.9 % 50 mL IVPB     1 g 100 mL/hr  over 30 Minutes Intravenous To Emergency Dept 03/07/12 1721 03/07/12 1955   03/07/12 1730  ertapenem (INVANZ) 1 g in sodium chloride 0.9 % 50 mL IVPB  Status:  Discontinued     1 g 100 mL/hr over 30 Minutes  Intravenous  Once 03/07/12 1716 03/07/12 1717      Assessment/Plan: Recurrent flareup of diverticulitis with small peridiverticular abscess. This does not appear severe and she is responding. She however has had rapid recurrence each time she is discharged and may be best served with a bowel prep and resection. We discussed the pros and cons with the patient and her daughter today. Continue current treatment.    LOS: 1 day    HOXWORTH,BENJAMIN T 03/08/2012

## 2012-03-09 DIAGNOSIS — D72829 Elevated white blood cell count, unspecified: Secondary | ICD-10-CM

## 2012-03-09 LAB — CBC
MCH: 30.3 pg (ref 26.0–34.0)
MCHC: 33.9 g/dL (ref 30.0–36.0)
MCV: 89.4 fL (ref 78.0–100.0)
Platelets: 255 10*3/uL (ref 150–400)
RBC: 3.86 MIL/uL — ABNORMAL LOW (ref 3.87–5.11)

## 2012-03-09 LAB — BASIC METABOLIC PANEL
CO2: 28 mEq/L (ref 19–32)
Calcium: 9.1 mg/dL (ref 8.4–10.5)
Creatinine, Ser: 0.7 mg/dL (ref 0.50–1.10)

## 2012-03-09 NOTE — Progress Notes (Signed)
Subjective: Feels better today, she has walked some, pain is better.  Objective: Vital signs in last 24 hours: Temp:  [97.6 F (36.4 C)-98.2 F (36.8 C)] 98.2 F (36.8 C) (03/09 0551) Pulse Rate:  [58-62] 62 (03/09 0551) Resp:  [17-18] 17 (03/09 0551) BP: (111-118)/(56-63) 111/56 mmHg (03/09 0551) SpO2:  [93 %-97 %] 93 % (03/09 0551) Last BM Date: 03/08/12 Afebrile, VSS, labs OK, Diet- clears Intake/Output from previous day: 03/08 0701 - 03/09 0700 In: -  Out: 300 [Urine:300] Intake/Output this shift:    General appearance: alert, cooperative and no distress GI: soft, non-tender; bowel sounds normal; no masses,  no organomegaly  Lab Results:   Recent Labs  03/08/12 0604 03/09/12 0602  WBC 4.5 4.7  HGB 12.3 11.7*  HCT 35.6* 34.5*  PLT 264 255    BMET  Recent Labs  03/08/12 0604 03/09/12 0602  NA 138 139  K 3.8 3.8  CL 102 102  CO2 26 28  GLUCOSE 85 89  BUN 8 7  CREATININE 0.65 0.70  CALCIUM 9.3 9.1   PT/INR No results found for this basename: LABPROT, INR,  in the last 72 hours   Recent Labs Lab 03/07/12 1319  AST 14  ALT 10  ALKPHOS 74  BILITOT 0.9  PROT 7.0  ALBUMIN 3.3*     Lipase  No results found for this basename: lipase     Studies/Results: Ct Abdomen Pelvis W Contrast  03/07/2012  *RADIOLOGY REPORT*  Clinical Data: Lower abdominal pain, history diverticulitis, CHF, COPD  CT ABDOMEN AND PELVIS WITH CONTRAST  Technique:  Multidetector CT imaging of the abdomen and pelvis was performed following the standard protocol during bolus administration of intravenous contrast. Sagittal and coronal MPR images reconstructed from axial data set.  Contrast: 80mL OMNIPAQUE IOHEXOL 300 MG/ML  SOLN Dilute oral contrast.  Comparison: 02/07/2012  Findings: Minimal atelectasis posterior right lower lobe base. Improved atelectasis left lower lobe. Mitral annular calcification noted. Bilateral renal cysts. Liver, spleen, pancreas, with kidneys, and adrenal  glands otherwise normal appearance.  Stomach incompletely distended, suboptimal assessment wall thickness. Small umbilical hernia containing fat. Diverticulosis of descending and sigmoid colon. Wall thickening identified at mid sigmoid colon with improved pericolic inflammatory changes compatible with acute diverticulitis. Extraluminal gas collections medial to the thickened sigmoid segment again seen compatible with contained perforation within the mesocolon. The presence of minimal high attenuation within this collection raises the possibility that this collection demonstrates continued communication with the adjacent sigmoid colon lumen.  No discrete drainable fluid/abscess collection identified. No free intraperitoneal air. Small bowel loops and remainder colon showed no additional acute changes. Scattered atherosclerotic calcifications. No mass, adenopathy, free to free of fluid, or hernia. Bones diffusely demineralized.  IMPRESSION: Persistent acute diverticulitis changes of the sigmoid colon with wall thickening and further improved pericolic infiltrative changes. Persistent visualization of extraluminal gas collections within the sigmoid mesocolon at site of prior diverticular abscess, containing a small amount of central high attenuation which could represent GI contrast and indicate continued communication of this collection with the sigmoid lumen. Diverticulosis of sigmoid and descending colon.   Original Report Authenticated By: Ulyses Southward, M.D.     Medications: . aspirin  81 mg Oral Daily  . enoxaparin (LOVENOX) injection  40 mg Subcutaneous Q24H  . ertapenem  1 g Intravenous Q24H  . feeding supplement  30 mL Oral QAC breakfast  . feeding supplement  1 Container Oral BID BM  . furosemide  5 mg Oral Daily  .  HYDROcodone-acetaminophen  1 tablet Oral Daily  . lisinopril  10 mg Oral Daily    Assessment/Plan 1. Recurrent diverticulitis with perforation prior hospitalizations 01/14/12, and  02/07/12 for the same problem.  2. COPD/remote history of tobacco use/chronic wheezing.  3. History of congestive heart failure/hospitalized 6 or more years ago for this.  4. Chronic shortness of breath since January 2014.  5. History of goiter/radioiodine therapy/not currently on Thyroid replacement  6. Hospitalized for pneumonia in January 2014.  7. Arthritis      Plan Today will be day 3 of Invanz.  i will check on increasing to full liquids.  They are asking about surgery.  I told them we are discussing it also.  No decision has been made at this point.   LOS: 2 days    JENNINGS,WILLARD 03/09/2012

## 2012-03-09 NOTE — Progress Notes (Signed)
[facility] PROGRESS NOTE  [name]:[id number] DOB: February 07, 1925 DOA: 03/07/2012 PCP: Kaleen Mask, MD  Assessment/Plan: Recurrent acute diverticulitis Medical management with IV antibiotics, ertapenem. Pain under control. Clear liquid diet. Bowel rest. Has had multiple episodes this year already. Surgery on board, planning possible surgery during this hospital stable.   COPD Not in exacerbation. Resume home combivent inhaler. Compensated.  Chronic diastolic heart failure EF 55-60% based on 2D Echo on 02/25/2009. Appears to be compensated at this time. No pedal edema, on lasix, aspirin and lisinopril. She denies any chest pain or shortness of breath.   Hypothyroidism Last TSH from 1/14 is normal and she is not on any thyroid supplements.   Hypertension Stable.   Prophylaxis Lovenox.   Code Status: Full code. Family Communication: Daughter by bedside. Disposition Plan: Pending per general surgery.   Consultants:  General surgery  Procedures:  As above.  Antibiotics:  Ertapenem 03/07/2012.  HPI/Subjective: Wondering if she could have her diet advanced.  No other specific concerns.  Objective: Filed Vitals:   03/08/12 0623 03/08/12 1500 03/08/12 2119 03/09/12 0551  BP: 146/54 118/63 113/58 111/56  Pulse: 63 58 60 62  Temp: 98.2 F (36.8 C) 97.6 F (36.4 C) 97.8 F (36.6 C) 98.2 F (36.8 C)  TempSrc: Oral Oral Oral   Resp: 16 18 17 17   Height:      Weight:      SpO2: 99% 97% 96% 93%    Intake/Output Summary (Last 24 hours) at 03/09/12 1331 Last data filed at 03/09/12 0500  Gross per 24 hour  Intake      0 ml  Output    300 ml  Net   -300 ml   Filed Weights   03/07/12 2000  Weight: 65.726 kg (144 lb 14.4 oz)    Exam: Physical Exam: General: Awake, Oriented, No acute distress. HEENT: EOMI. Neck: Supple CV: S1 and S2 Lungs: Clear to ascultation bilaterally Abdomen: Soft, tender to deep palpation, Nondistended, +bowel  sounds. Ext: Good pulses. Trace edema.  Data Reviewed: Basic Metabolic Panel:  Recent Labs Lab 03/07/12 1319 03/07/12 2030 03/08/12 0604 03/09/12 0602  NA 141  --  138 139  K 3.9  --  3.8 3.8  CL 104  --  102 102  CO2 28  --  26 28  GLUCOSE 103*  --  85 89  BUN 10  --  8 7  CREATININE 0.63 0.60 0.65 0.70  CALCIUM 9.5  --  9.3 9.1   Liver Function Tests:  Recent Labs Lab 03/07/12 1319  AST 14  ALT 10  ALKPHOS 74  BILITOT 0.9  PROT 7.0  ALBUMIN 3.3*   No results found for this basename: LIPASE, AMYLASE,  in the last 168 hours No results found for this basename: AMMONIA,  in the last 168 hours CBC:  Recent Labs Lab 03/07/12 1319 03/07/12 2030 03/08/12 0604 03/09/12 0602  WBC 6.0 6.1 4.5 4.7  NEUTROABS 4.0  --   --   --   HGB 12.4 12.2 12.3 11.7*  HCT 36.3 33.7* 35.6* 34.5*  MCV 87.7 90.3 88.8 89.4  PLT 272 251 264 255   Cardiac Enzymes: No results found for this basename: CKTOTAL, CKMB, CKMBINDEX, TROPONINI,  in the last 168 hours BNP (last 3 results)  Recent Labs  02/10/12 0850  PROBNP 5743.0*   CBG: No results found for this basename: GLUCAP,  in the last 168 hours  No results found for this or any previous visit (  from the past 240 hour(s)).   Studies: Ct Abdomen Pelvis W Contrast  03/07/2012  *RADIOLOGY REPORT*  Clinical Data: Lower abdominal pain, history diverticulitis, CHF, COPD  CT ABDOMEN AND PELVIS WITH CONTRAST  Technique:  Multidetector CT imaging of the abdomen and pelvis was performed following the standard protocol during bolus administration of intravenous contrast. Sagittal and coronal MPR images reconstructed from axial data set.  Contrast: 80mL OMNIPAQUE IOHEXOL 300 MG/ML  SOLN Dilute oral contrast.  Comparison: 02/07/2012  Findings: Minimal atelectasis posterior right lower lobe base. Improved atelectasis left lower lobe. Mitral annular calcification noted. Bilateral renal cysts. Liver, spleen, pancreas, with kidneys, and adrenal glands  otherwise normal appearance.  Stomach incompletely distended, suboptimal assessment wall thickness. Small umbilical hernia containing fat. Diverticulosis of descending and sigmoid colon. Wall thickening identified at mid sigmoid colon with improved pericolic inflammatory changes compatible with acute diverticulitis. Extraluminal gas collections medial to the thickened sigmoid segment again seen compatible with contained perforation within the mesocolon. The presence of minimal high attenuation within this collection raises the possibility that this collection demonstrates continued communication with the adjacent sigmoid colon lumen.  No discrete drainable fluid/abscess collection identified. No free intraperitoneal air. Small bowel loops and remainder colon showed no additional acute changes. Scattered atherosclerotic calcifications. No mass, adenopathy, free to free of fluid, or hernia. Bones diffusely demineralized.  IMPRESSION: Persistent acute diverticulitis changes of the sigmoid colon with wall thickening and further improved pericolic infiltrative changes. Persistent visualization of extraluminal gas collections within the sigmoid mesocolon at site of prior diverticular abscess, containing a small amount of central high attenuation which could represent GI contrast and indicate continued communication of this collection with the sigmoid lumen. Diverticulosis of sigmoid and descending colon.   Original Report Authenticated By: Ulyses Southward, M.D.     Scheduled Meds: . aspirin  81 mg Oral Daily  . enoxaparin (LOVENOX) injection  40 mg Subcutaneous Q24H  . ertapenem  1 g Intravenous Q24H  . feeding supplement  30 mL Oral QAC breakfast  . feeding supplement  1 Container Oral BID BM  . furosemide  5 mg Oral Daily  . HYDROcodone-acetaminophen  1 tablet Oral Daily  . lisinopril  10 mg Oral Daily   Continuous Infusions:   Active Problems:   Hypertension   CHF (congestive heart failure)   H/O:  hypothyroidism   Intra-abdominal abscess   Diverticulitis of sigmoid colon    Korion Cuevas A, MD  [facility] Pager [phone]. If 7PM-7AM, please contact night-coverage at [email or web address], password [email or web address] 03/09/2012, 1:31 PM  LOS: 2 days

## 2012-03-09 NOTE — Progress Notes (Signed)
I have seen and examined the patient and agree with the assessment and plans.  Continuing current care  Douglas A. Magnus Ivan  MD, FACS

## 2012-03-10 MED ORDER — MAGNESIUM CITRATE PO SOLN
1.0000 | Freq: Once | ORAL | Status: AC
  Administered 2012-03-10: 1 via ORAL
  Filled 2012-03-10: qty 296

## 2012-03-10 NOTE — Progress Notes (Signed)
[facility] PROGRESS NOTE  [name] NWG:[id number] DOB: [birth year] DOA: 03/07/2012 PCP: Kaleen Mask, MD  Assessment/Plan: Recurrent acute diverticulitis Medical management with IV antibiotics, ertapenem. Pain under control. Clear liquid diet. Bowel rest. Has had multiple episodes this year already. Surgery on board, planning possible surgery during this hospital stable?  COPD Not in exacerbation. Resume home combivent inhaler. Compensated.  Chronic diastolic heart failure EF 55-60% based on 2D Echo on 02/25/2009. Appears to be compensated at this time. No pedal edema, on lasix, aspirin and lisinopril. She denies any chest pain or shortness of breath.   Hypothyroidism Last TSH from 1/14 is normal and she is not on any thyroid supplements.   Hypertension Stable.   Prophylaxis Lovenox.   Code Status: Full code. Family Communication: No family present. Disposition Plan: Pending per general surgery.   Consultants:  General surgery  Procedures:  As above.  Antibiotics:  Ertapenem 03/07/2012.  HPI/Subjective: Had some abdominal pain this morning, improved with pain medication. Wondering if she could have her diet advanced.  Objective: Filed Vitals:   03/09/12 0551 03/09/12 1333 03/09/12 2140 03/10/12 0625  BP: 111/56 123/57 123/69 120/59  Pulse: 62 65 65 67  Temp: 98.2 F (36.8 C) 98.5 F (36.9 C) 97.5 F (36.4 C) 98.6 F (37 C)  TempSrc:  Oral Oral   Resp: 17 20 19 18   Height:      Weight:      SpO2: 93% 97% 94% 94%   No intake or output data in the 24 hours ending 03/10/12 1400 Filed Weights   03/07/12 2000  Weight: 65.726 kg (144 lb 14.4 oz)    Exam: Physical Exam: General: Awake, Oriented, No acute distress. HEENT: EOMI. Neck: Supple CV: S1 and S2 Lungs: Clear to ascultation bilaterally Abdomen: Soft, tender to deep palpation, Nondistended, +bowel sounds. Ext: Good pulses. Trace edema.  Data Reviewed: Basic Metabolic  Panel:  Recent Labs Lab 03/07/12 1319 03/07/12 2030 03/08/12 0604 03/09/12 0602  NA 141  --  138 139  K 3.9  --  3.8 3.8  CL 104  --  102 102  CO2 28  --  26 28  GLUCOSE 103*  --  85 89  BUN 10  --  8 7  CREATININE 0.63 0.60 0.65 0.70  CALCIUM 9.5  --  9.3 9.1   Liver Function Tests:  Recent Labs Lab 03/07/12 1319  AST 14  ALT 10  ALKPHOS 74  BILITOT 0.9  PROT 7.0  ALBUMIN 3.3*   No results found for this basename: LIPASE, AMYLASE,  in the last 168 hours No results found for this basename: AMMONIA,  in the last 168 hours CBC:  Recent Labs Lab 03/07/12 1319 03/07/12 2030 03/08/12 0604 03/09/12 0602  WBC 6.0 6.1 4.5 4.7  NEUTROABS 4.0  --   --   --   HGB 12.4 12.2 12.3 11.7*  HCT 36.3 33.7* 35.6* 34.5*  MCV 87.7 90.3 88.8 89.4  PLT 272 251 264 255   Cardiac Enzymes: No results found for this basename: CKTOTAL, CKMB, CKMBINDEX, TROPONINI,  in the last 168 hours BNP (last 3 results)  Recent Labs  02/10/12 0850  PROBNP 5743.0*   CBG: No results found for this basename: GLUCAP,  in the last 168 hours  No results found for this or any previous visit (from the past 240 hour(s)).   Studies: No results found.  Scheduled Meds: . aspirin  81 mg Oral Daily  . enoxaparin (LOVENOX) injection  40  mg Subcutaneous Q24H  . ertapenem  1 g Intravenous Q24H  . feeding supplement  30 mL Oral QAC breakfast  . feeding supplement  1 Container Oral BID BM  . furosemide  5 mg Oral Daily  . HYDROcodone-acetaminophen  1 tablet Oral Daily  . lisinopril  10 mg Oral Daily   Continuous Infusions:   Active Problems:   Hypertension   CHF (congestive heart failure)   H/O: hypothyroidism   Intra-abdominal abscess   Diverticulitis of sigmoid colon    REDDY,SRIKAR A, MD  [facility] Pager [phone]. If 7PM-7AM, please contact night-coverage at [email or web address], password [facility] 03/10/2012, 2:00 PM  LOS: 3 days

## 2012-03-10 NOTE — Progress Notes (Signed)
I have seen and examined the patient and agree with PA Marlyne Beards note.  I had a long conversation w/ the patient in regards to her smoldering diverticulitis. We talked about all the risks of surgery to include, possible ostomy, possible need to remain on the ventilator after the operation, and damage to surrounding structures.  We will plan on sgmoid resection likely Wed, with possible ostomy.    Can't con't CLD.  Bowel prep Tues with 2 bottle of MAg citrate. Consent

## 2012-03-10 NOTE — Progress Notes (Signed)
Physical Therapy Treatment Patient Details Name: [name] MRN: [id number] DOB: 1925/01/16 Today's Date: 03/10/2012 Time: 1191-4782 PT Time Calculation (min): 24 min  PT Assessment / Plan / Recommendation Comments on Treatment Session  Pt making great progress with therapy and is safe to walk with family/daught assistance with walker.    Follow Up Recommendations  Home health PT;Supervision - Intermittent           Equipment Recommendations  None recommended by PT       Frequency Min 3X/week   Plan Discharge plan remains appropriate;Frequency remains appropriate    Precautions / Restrictions Precautions Precautions: None Restrictions Weight Bearing Restrictions: No       Mobility  Bed Mobility Bed Mobility: Not assessed Details for Bed Mobility Assistance: pt seated on edge of bed upon arrival for therapy Transfers Sit to Stand: 6: Modified independent (Device/Increase time);From bed;With upper extremity assist Stand to Sit: 6: Modified independent (Device/Increase time);To bed;With upper extremity assist Details for Transfer Assistance: no cues or assistance needed. incr time required. Ambulation/Gait Ambulation/Gait Assistance: 5: Supervision Ambulation Distance (Feet): 250 Feet Assistive device: Rolling walker Ambulation/Gait Assistance Details: min vc's for posture and walker position with gait Gait Pattern: Step-through pattern;Decreased stride length;Trunk flexed Gait velocity: decreased Stairs: Yes Stairs Assistance: 5: Supervision Stairs Assistance Details (indicate cue type and reason): pt has a door way she holds onto on the left, used rail as doorway simulation. Stair Management Technique: Step to pattern;Forwards;One rail Left Number of Stairs: 2      PT Goals Acute Rehab PT Goals PT Goal: Sit to Stand - Progress: Met PT Goal: Ambulate - Progress: Progressing toward goal PT Goal: Up/Down Stairs - Progress: Met  Visit Information  Last PT  Received On: 03/10/12 Assistance Needed: +1    Subjective Data  Subjective: "I'm not hurting right now.  I had some pain medicine." Agreeable to therapy at this time.   Cognition  Cognition Overall Cognitive Status: Appears within functional limits for tasks assessed/performed Arousal/Alertness: Awake/alert Orientation Level: Appears intact for tasks assessed Behavior During Session: [facility] for tasks performed       End of Session PT - End of Session Equipment Utilized During Treatment: Gait belt Activity Tolerance: Patient tolerated treatment well Patient left: in bed;with call bell/phone within reach;with family/visitor present (seated on edge of bed)   GP     Sallyanne Kuster 03/10/2012, 11:49 AM  Sallyanne Kuster, PTA Office- [phone]

## 2012-03-10 NOTE — Progress Notes (Addendum)
  Subjective: She did well all day yesterday, walked some, but had pain again this AM across abdomen. Diffuse, not really in one place, better with pain med.  Objective: Vital signs in last 24 hours: Temp:  [97.5 F (36.4 C)-98.6 F (37 C)] 98.6 F (37 C) (03/10 0625) Pulse Rate:  [65-67] 67 (03/10 0625) Resp:  [18-20] 18 (03/10 0625) BP: (120-123)/(57-69) 120/59 mmHg (03/10 0625) SpO2:  [94 %-97 %] 94 % (03/10 0625) Last BM Date: 03/08/12 Afebrile, VSS, no labs today, OK yesterday, Diet- clears Intake/Output from previous day:   Intake/Output this shift:    General appearance: alert, cooperative and no distress GI: soft, non-tender; bowel sounds normal; no masses,  no organomegaly  Lab Results:   Recent Labs  03/08/12 0604 03/09/12 0602  WBC 4.5 4.7  HGB 12.3 11.7*  HCT 35.6* 34.5*  PLT 264 255    BMET  Recent Labs  03/08/12 0604 03/09/12 0602  NA 138 139  K 3.8 3.8  CL 102 102  CO2 26 28  GLUCOSE 85 89  BUN 8 7  CREATININE 0.65 0.70  CALCIUM 9.3 9.1   PT/INR No results found for this basename: LABPROT, INR,  in the last 72 hours   Recent Labs Lab 03/07/12 1319  AST 14  ALT 10  ALKPHOS 74  BILITOT 0.9  PROT 7.0  ALBUMIN 3.3*     Lipase  No results found for this basename: lipase     Studies/Results: No results found.  Medications: . aspirin  81 mg Oral Daily  . enoxaparin (LOVENOX) injection  40 mg Subcutaneous Q24H  . ertapenem  1 g Intravenous Q24H  . feeding supplement  30 mL Oral QAC breakfast  . feeding supplement  1 Container Oral BID BM  . furosemide  5 mg Oral Daily  . HYDROcodone-acetaminophen  1 tablet Oral Daily  . lisinopril  10 mg Oral Daily    Assessment/Plan 1. Recurrent diverticulitis with perforation prior hospitalizations 01/14/12, and 02/07/12 for the same problem.  2. COPD/remote history of tobacco use/chronic wheezing.  3. History of congestive heart failure/hospitalized 6 or more years ago for this.  4.  Chronic shortness of breath since January 2014.  5. History of goiter/radioiodine therapy/not currently on Thyroid replacement  6. Hospitalized for pneumonia in January 2014.  7. Arthritis   Plan:  She has some pain this AM again, kind of diffuse across abdomen. Continue antibiotics for now.   LOS: 3 days    RIGBY, MICHAEL 03/10/2012

## 2012-03-11 LAB — SURGICAL PCR SCREEN: Staphylococcus aureus: NEGATIVE

## 2012-03-11 MED ORDER — MAGNESIUM CITRATE PO SOLN
1.0000 | Freq: Once | ORAL | Status: AC
  Administered 2012-03-11: 1 via ORAL
  Filled 2012-03-11: qty 296

## 2012-03-11 MED ORDER — BACITRACIN-NEOMYCIN-POLYMYXIN OINTMENT TUBE
TOPICAL_OINTMENT | Freq: Three times a day (TID) | CUTANEOUS | Status: AC
  Administered 2012-03-11 – 2012-03-13 (×4): via TOPICAL
  Filled 2012-03-11: qty 15

## 2012-03-11 MED ORDER — POTASSIUM CHLORIDE IN NACL 20-0.9 MEQ/L-% IV SOLN
INTRAVENOUS | Status: DC
  Administered 2012-03-11: 23:00:00 via INTRAVENOUS
  Filled 2012-03-11 (×3): qty 1000

## 2012-03-11 NOTE — ED Provider Notes (Signed)
Medical screening examination/treatment/procedure(s) were performed by non-physician practitioner and as supervising physician I was immediately available for consultation/collaboration.   Nelia Shi, MD 03/11/12 1114

## 2012-03-11 NOTE — Consult Note (Signed)
WOC consulted for preoperative stoma site marking for both ileostomy and colostomy. I have explained both to the patient and she seems to understand. Evaluated pt standing, sitting and lying to determine site out of any abdominal contour issues, within the patients visual field and to avoid the patients belt line.  Marked for R sided ileostomy 6 cm to the right of the umbilicus, slightly below and within the rectus muscle. Marked for L sided colostomy 6.5cm to the left of the umbilicus, slightly below and within the rectus muscle.   Pt could visualize both sites with sitting and lying.  Left ostomy educational DVD and booklet in the patients room for her.  WOC team will follow up postoperatively for teaching and care. Shawntell Dixson [facility] RN,CWOCN [phone]

## 2012-03-11 NOTE — Progress Notes (Signed)
I have seen and examined the patient and agree with PA-Jennings note.  I spoke w/ the pt and her son which was in the room today.  Again all questions were answered to their satisfaction.  Will proceed to OR in AM.

## 2012-03-11 NOTE — Progress Notes (Signed)
[facility] PROGRESS NOTE  [name] ZOX:[id number] DOB: 06/17/1925 DOA: 03/07/2012 PCP: Kaleen Mask, MD  Brief Narrative: [name] is a 79 y.o. female with prior h/o copd, hypertension, recent admission for diverticulitis, sent home on oral antibiotics , comes back with abd pain since one week, not associated with nausea or vomiting. No diarrhea or constipation. Denies fever. On arrival she under ct abd and pelvis was found to have persistent diverticulitis but improvement int he size of the tiny abscess. Surgery was consulted by ED, recommended IV antibiotics and no surgical intervention at this time. She denies any other complaints.  Assessment/Plan: Recurrent acute diverticulitis Medical management with IV antibiotics, ertapenem. Pain under control. Clear liquid diet. Bowel rest. Has had multiple episodes this year already. Surgery on board, planning on surgery tomorrow given recurrent diverticulitis.  COPD Not in exacerbation. Resume home combivent inhaler. Compensated.  Chronic diastolic heart failure EF 55-60% based on 2D Echo on 02/25/2009. Appears to be compensated at this time. No pedal edema, on lasix, aspirin and lisinopril. She denies any chest pain or shortness of breath.   Hypothyroidism Last TSH from 1/14 is normal and she is not on any thyroid supplements.   Hypertension Stable.   Prophylaxis Lovenox.   Code Status: Full code. Family Communication: No family present. Disposition Plan: Pending per general surgery.   Consultants:  General surgery  Procedures:  As above.  Antibiotics:  Ertapenem 03/07/2012.  HPI/Subjective: No specific complains. Questions about surgery answered for tomorrow.  Objective: Filed Vitals:   03/10/12 1434 03/10/12 2120 03/11/12 0530 03/11/12 1000  BP: 113/67 133/61 116/52 100/52  Pulse: 71 80 67   Temp: 98 F (36.7 C) 98.3 F (36.8 C) 99.1 F (37.3 C)   TempSrc: Oral Oral Oral   Resp: 20 18 18     Height:      Weight:      SpO2: 94% 91% 91%     Intake/Output Summary (Last 24 hours) at 03/11/12 1130 Last data filed at 03/10/12 1434  Gross per 24 hour  Intake    240 ml  Output      0 ml  Net    240 ml   Filed Weights   03/07/12 2000  Weight: 65.726 kg (144 lb 14.4 oz)    Exam: Physical Exam: General: Awake, Oriented, No acute distress. HEENT: EOMI. Neck: Supple CV: S1 and S2 Lungs: Clear to ascultation bilaterally Abdomen: Soft, tender to deep palpation, Nondistended, +bowel sounds. Ext: Good pulses. Trace edema.  Data Reviewed: Basic Metabolic Panel:  Recent Labs Lab 03/07/12 1319 03/07/12 2030 03/08/12 0604 03/09/12 0602  NA 141  --  138 139  K 3.9  --  3.8 3.8  CL 104  --  102 102  CO2 28  --  26 28  GLUCOSE 103*  --  85 89  BUN 10  --  8 7  CREATININE 0.63 0.60 0.65 0.70  CALCIUM 9.5  --  9.3 9.1   Liver Function Tests:  Recent Labs Lab 03/07/12 1319  AST 14  ALT 10  ALKPHOS 74  BILITOT 0.9  PROT 7.0  ALBUMIN 3.3*   No results found for this basename: LIPASE, AMYLASE,  in the last 168 hours No results found for this basename: AMMONIA,  in the last 168 hours CBC:  Recent Labs Lab 03/07/12 1319 03/07/12 2030 03/08/12 0604 03/09/12 0602  WBC 6.0 6.1 4.5 4.7  NEUTROABS 4.0  --   --   --  HGB 12.4 12.2 12.3 11.7*  HCT 36.3 33.7* 35.6* 34.5*  MCV 87.7 90.3 88.8 89.4  PLT 272 251 264 255   Cardiac Enzymes: No results found for this basename: CKTOTAL, CKMB, CKMBINDEX, TROPONINI,  in the last 168 hours BNP (last 3 results)  Recent Labs  02/10/12 0850  PROBNP 5743.0*   CBG: No results found for this basename: GLUCAP,  in the last 168 hours  No results found for this or any previous visit (from the past 240 hour(s)).   Studies: No results found.  Scheduled Meds: . aspirin  81 mg Oral Daily  . enoxaparin (LOVENOX) injection  40 mg Subcutaneous Q24H  . ertapenem  1 g Intravenous Q24H  . feeding supplement  30 mL Oral QAC  breakfast  . feeding supplement  1 Container Oral BID BM  . furosemide  5 mg Oral Daily  . HYDROcodone-acetaminophen  1 tablet Oral Daily  . lisinopril  10 mg Oral Daily  . magnesium citrate  1 Bottle Oral Once   Continuous Infusions: . [START ON 03/12/2012] 0.9 % NaCl with KCl 20 mEq / L      Active Problems:   Hypertension   CHF (congestive heart failure)   H/O: hypothyroidism   Intra-abdominal abscess   Diverticulitis of sigmoid colon    REDDY,SRIKAR A, MD  [facility] Pager [phone]. If 7PM-7AM, please contact night-coverage at [email or web address], password [facility] 03/11/2012, 11:30 AM  LOS: 4 days

## 2012-03-11 NOTE — Progress Notes (Signed)
  Subjective: Still having some pain, better with pills, dilaudid makes her "swimmy headed."  Says she's resolved to having surgery.  Objective: Vital signs in last 24 hours: Temp:  [98 F (36.7 C)-99.1 F (37.3 C)] 99.1 F (37.3 C) (03/11 0530) Pulse Rate:  [67-80] 67 (03/11 0530) Resp:  [18-20] 18 (03/11 0530) BP: (113-133)/(52-67) 116/52 mmHg (03/11 0530) SpO2:  [91 %-94 %] 91 % (03/11 0530) Last BM Date: 03/10/12 Diet: clears, Tm 99.1, VSS, labs OK yesterday, Intake/Output from previous day: 03/10 0701 - 03/11 0700 In: 240 [P.O.:240] Out: -  Intake/Output this shift:    General appearance: alert, cooperative and no distress Resp: clear to auscultation bilaterally GI: soft, non-tender; bowel sounds normal; no masses,  no organomegaly  Lab Results:   Recent Labs  03/09/12 0602  WBC 4.7  HGB 11.7*  HCT 34.5*  PLT 255    BMET  Recent Labs  03/09/12 0602  NA 139  K 3.8  CL 102  CO2 28  GLUCOSE 89  BUN 7  CREATININE 0.70  CALCIUM 9.1   PT/INR No results found for this basename: LABPROT, INR,  in the last 72 hours   Recent Labs Lab 03/07/12 1319  AST 14  ALT 10  ALKPHOS 74  BILITOT 0.9  PROT 7.0  ALBUMIN 3.3*     Lipase  No results found for this basename: lipase     Studies/Results: No results found.  Medications: . aspirin  81 mg Oral Daily  . enoxaparin (LOVENOX) injection  40 mg Subcutaneous Q24H  . ertapenem  1 g Intravenous Q24H  . feeding supplement  30 mL Oral QAC breakfast  . feeding supplement  1 Container Oral BID BM  . furosemide  5 mg Oral Daily  . HYDROcodone-acetaminophen  1 tablet Oral Daily  . lisinopril  10 mg Oral Daily    Assessment/Plan 1. Recurrent diverticulitis with perforation prior hospitalizations 01/14/12, and 02/07/12 for the same problem.  2. COPD/remote history of tobacco use/chronic wheezing.  3. History of congestive heart failure/hospitalized 6 or more years ago for this.  4. Chronic shortness of  breath since January 2014.  5. History of goiter/radioiodine therapy/not currently on Thyroid replacement  6. Hospitalized for pneumonia in January 2014.  7. Arthritis   Plan:  Prep for surgery tomorrow, IS today so she can learn to use it today.    LOS: 4 days    JENNINGS,WILLARD 03/11/2012

## 2012-03-12 ENCOUNTER — Encounter (HOSPITAL_COMMUNITY): Payer: Self-pay | Admitting: Certified Registered"

## 2012-03-12 ENCOUNTER — Encounter (HOSPITAL_COMMUNITY)
Admission: EM | Disposition: A | Discharge status: Skilled Nursing Facility | Payer: Self-pay | Source: Home / Self Care | Attending: Internal Medicine

## 2012-03-12 ENCOUNTER — Inpatient Hospital Stay (HOSPITAL_COMMUNITY): Payer: Medicare Other | Admitting: Certified Registered"

## 2012-03-12 DIAGNOSIS — K651 Peritoneal abscess: Secondary | ICD-10-CM

## 2012-03-12 DIAGNOSIS — J189 Pneumonia, unspecified organism: Secondary | ICD-10-CM

## 2012-03-12 DIAGNOSIS — Z862 Personal history of diseases of the blood and blood-forming organs and certain disorders involving the immune mechanism: Secondary | ICD-10-CM

## 2012-03-12 HISTORY — PX: COLOSTOMY REVISION: SHX5232

## 2012-03-12 HISTORY — PX: COLOSTOMY: SHX63

## 2012-03-12 LAB — CREATININE, SERUM
Creatinine, Ser: 0.7 mg/dL (ref 0.50–1.10)
GFR calc non Af Amer: 77 mL/min — ABNORMAL LOW (ref >90)

## 2012-03-12 LAB — CBC
Hemoglobin: 13 g/dL (ref 12.0–15.0)
MCH: 29.2 pg (ref 26.0–34.0)
MCH: 29.4 pg (ref 26.0–34.0)
MCHC: 34.1 g/dL (ref 30.0–36.0)
MCV: 85.1 fL (ref 78.0–100.0)
Platelets: 224 10*3/uL (ref 150–400)
Platelets: 285 10*3/uL (ref 150–400)
RDW: 13.3 % (ref 11.5–15.5)
RDW: 13.3 % (ref 11.5–15.5)
WBC: 5.7 10*3/uL (ref 4.0–10.5)

## 2012-03-12 LAB — BASIC METABOLIC PANEL
Calcium: 8.6 mg/dL (ref 8.4–10.5)
Chloride: 105 mEq/L (ref 96–112)
Creatinine, Ser: 0.63 mg/dL (ref 0.50–1.10)
GFR calc Af Amer: >90 mL/min (ref >90)

## 2012-03-12 LAB — POCT I-STAT 4, (NA,K, GLUC, HGB,HCT): Potassium: 3.9 mEq/L (ref 3.5–5.1)

## 2012-03-12 SURGERY — COLECTOMY, SIGMOID, OPEN
Anesthesia: General | Site: Abdomen | Wound class: Clean Contaminated

## 2012-03-12 MED ORDER — DEXTROSE-NACL 5-0.9 % IV SOLN
INTRAVENOUS | Status: AC
  Administered 2012-03-12 – 2012-03-17 (×9): via INTRAVENOUS

## 2012-03-12 MED ORDER — HYDROMORPHONE HCL PF 1 MG/ML IJ SOLN
INTRAMUSCULAR | Status: AC
  Administered 2012-03-12: 0.25 mg via INTRAVENOUS
  Filled 2012-03-12: qty 1

## 2012-03-12 MED ORDER — ONDANSETRON HCL 4 MG/2ML IJ SOLN
INTRAMUSCULAR | Status: DC | PRN
  Administered 2012-03-12: 4 mg via INTRAVENOUS

## 2012-03-12 MED ORDER — ENOXAPARIN SODIUM 40 MG/0.4ML ~~LOC~~ SOLN
40.0000 mg | SUBCUTANEOUS | Status: DC
  Administered 2012-03-13 – 2012-03-31 (×18): 40 mg via SUBCUTANEOUS
  Filled 2012-03-12 (×23): qty 0.4

## 2012-03-12 MED ORDER — DIPHENHYDRAMINE HCL 12.5 MG/5ML PO ELIX
12.5000 mg | ORAL_SOLUTION | Freq: Four times a day (QID) | ORAL | Status: DC | PRN
  Filled 2012-03-12: qty 5

## 2012-03-12 MED ORDER — SODIUM CHLORIDE 0.9 % IJ SOLN: 9.0000 mL | INTRAMUSCULAR | Status: DC | PRN

## 2012-03-12 MED ORDER — EPHEDRINE SULFATE 50 MG/ML IJ SOLN
INTRAMUSCULAR | Status: DC | PRN
  Administered 2012-03-12: 5 mg via INTRAVENOUS

## 2012-03-12 MED ORDER — ONDANSETRON HCL 4 MG/2ML IJ SOLN
4.0000 mg | Freq: Four times a day (QID) | INTRAMUSCULAR | Status: DC | PRN
  Administered 2012-03-14 – 2012-03-20 (×8): 4 mg via INTRAVENOUS
  Filled 2012-03-12: qty 2

## 2012-03-12 MED ORDER — 0.9 % SODIUM CHLORIDE (POUR BTL) OPTIME
TOPICAL | Status: DC | PRN
  Administered 2012-03-12 (×4): 1000 mL

## 2012-03-12 MED ORDER — NALOXONE HCL 0.4 MG/ML IJ SOLN
0.4000 mg | INTRAMUSCULAR | Status: DC | PRN
  Filled 2012-03-12: qty 1

## 2012-03-12 MED ORDER — HYDROMORPHONE 0.3 MG/ML IV SOLN
INTRAVENOUS | Status: AC
  Filled 2012-03-12: qty 25

## 2012-03-12 MED ORDER — ROCURONIUM BROMIDE 100 MG/10ML IV SOLN
INTRAVENOUS | Status: DC | PRN
  Administered 2012-03-12: 10 mg via INTRAVENOUS
  Administered 2012-03-12: 40 mg via INTRAVENOUS
  Administered 2012-03-12: 10 mg via INTRAVENOUS

## 2012-03-12 MED ORDER — ONDANSETRON HCL 4 MG/2ML IJ SOLN: 4.0000 mg | Freq: Four times a day (QID) | INTRAMUSCULAR | Status: DC | PRN

## 2012-03-12 MED ORDER — FENTANYL CITRATE 0.05 MG/ML IJ SOLN
INTRAMUSCULAR | Status: DC | PRN
  Administered 2012-03-12 (×3): 50 ug via INTRAVENOUS
  Administered 2012-03-12: 25 ug via INTRAVENOUS
  Administered 2012-03-12 (×3): 50 ug via INTRAVENOUS
  Administered 2012-03-12: 25 ug via INTRAVENOUS
  Administered 2012-03-12: 50 ug via INTRAVENOUS

## 2012-03-12 MED ORDER — HYDROMORPHONE 0.3 MG/ML IV SOLN
INTRAVENOUS | Status: DC
  Administered 2012-03-12: 2.19 mg via INTRAVENOUS
  Administered 2012-03-12: 1.39 mg via INTRAVENOUS
  Administered 2012-03-12: 2.39 mg via INTRAVENOUS
  Administered 2012-03-13: 1.59 mg via INTRAVENOUS
  Administered 2012-03-13: 01:00:00 via INTRAVENOUS
  Administered 2012-03-13: 1.59 mg via INTRAVENOUS
  Administered 2012-03-13: 1.99 mg via INTRAVENOUS
  Administered 2012-03-13: 0.799 mg via INTRAVENOUS
  Administered 2012-03-13: 1.19 mg via INTRAVENOUS
  Administered 2012-03-13: 0.2 mg via INTRAVENOUS
  Administered 2012-03-14: 0.6 mg via INTRAVENOUS
  Administered 2012-03-14: 1.39 mg via INTRAVENOUS
  Administered 2012-03-14: 0.8 mg via INTRAVENOUS
  Administered 2012-03-14: 0.599 mg via INTRAVENOUS
  Administered 2012-03-14: 0.799 mg via INTRAVENOUS
  Administered 2012-03-15: 0.999 mg via INTRAVENOUS
  Administered 2012-03-15: 1.39 mg via INTRAVENOUS
  Administered 2012-03-15: 0.999 mg via INTRAVENOUS
  Administered 2012-03-15: 0.6 mg via INTRAVENOUS
  Filled 2012-03-12 (×2): qty 25

## 2012-03-12 MED ORDER — NEOSTIGMINE METHYLSULFATE 1 MG/ML IJ SOLN
INTRAMUSCULAR | Status: DC | PRN
  Administered 2012-03-12: 4 mg via INTRAVENOUS

## 2012-03-12 MED ORDER — HYDROMORPHONE HCL PF 1 MG/ML IJ SOLN
0.2500 mg | INTRAMUSCULAR | Status: DC | PRN
  Administered 2012-03-12 (×3): 0.25 mg via INTRAVENOUS

## 2012-03-12 MED ORDER — GLYCOPYRROLATE 0.2 MG/ML IJ SOLN
INTRAMUSCULAR | Status: DC | PRN
  Administered 2012-03-12: 0.6 mg via INTRAVENOUS

## 2012-03-12 MED ORDER — LIDOCAINE HCL (CARDIAC) 20 MG/ML IV SOLN
INTRAVENOUS | Status: DC | PRN
  Administered 2012-03-12: 70 mg via INTRAVENOUS

## 2012-03-12 MED ORDER — CHLORHEXIDINE GLUCONATE 0.12 % MT SOLN
15.0000 mL | Freq: Two times a day (BID) | OROMUCOSAL | Status: DC
  Administered 2012-03-13 – 2012-03-31 (×33): 15 mL via OROMUCOSAL
  Filled 2012-03-12 (×36): qty 15

## 2012-03-12 MED ORDER — OXYCODONE HCL 5 MG PO TABS: 5.0000 mg | ORAL_TABLET | Freq: Once | ORAL | Status: DC | PRN

## 2012-03-12 MED ORDER — PROMETHAZINE HCL 25 MG/ML IJ SOLN: 6.2500 mg | INTRAMUSCULAR | Status: DC | PRN

## 2012-03-12 MED ORDER — BIOTENE DRY MOUTH MT LIQD
15.0000 mL | Freq: Two times a day (BID) | OROMUCOSAL | Status: DC
  Administered 2012-03-13 – 2012-03-31 (×31): 15 mL via OROMUCOSAL

## 2012-03-12 MED ORDER — LACTATED RINGERS IV SOLN
INTRAVENOUS | Status: DC | PRN
  Administered 2012-03-12 (×2): via INTRAVENOUS

## 2012-03-12 MED ORDER — ARTIFICIAL TEARS OP OINT
TOPICAL_OINTMENT | OPHTHALMIC | Status: DC | PRN
  Administered 2012-03-12: 1 via OPHTHALMIC

## 2012-03-12 MED ORDER — OXYCODONE HCL 5 MG/5ML PO SOLN: 5.0000 mg | Freq: Once | ORAL | Status: DC | PRN

## 2012-03-12 MED ORDER — PROPOFOL 10 MG/ML IV BOLUS
INTRAVENOUS | Status: DC | PRN
  Administered 2012-03-12: 110 mg via INTRAVENOUS

## 2012-03-12 MED ORDER — ONDANSETRON HCL 4 MG PO TABS: 4.0000 mg | ORAL_TABLET | Freq: Four times a day (QID) | ORAL | Status: DC | PRN

## 2012-03-12 MED ORDER — DIPHENHYDRAMINE HCL 50 MG/ML IJ SOLN
12.5000 mg | Freq: Four times a day (QID) | INTRAMUSCULAR | Status: DC | PRN
  Administered 2012-03-16: 12.5 mg via INTRAVENOUS
  Filled 2012-03-12: qty 0.25
  Filled 2012-03-12: qty 1

## 2012-03-12 MED ORDER — MEPERIDINE HCL 25 MG/ML IJ SOLN: 6.2500 mg | INTRAMUSCULAR | Status: DC | PRN

## 2012-03-12 MED ORDER — SURGILUBE EX GEL
CUTANEOUS | Status: DC | PRN
  Administered 2012-03-12: 1 via TOPICAL

## 2012-03-12 SURGICAL SUPPLY — 60 items
BLADE SURG ROTATE 9660 (MISCELLANEOUS) IMPLANT
CANISTER SUCTION 2500CC (MISCELLANEOUS) ×2 IMPLANT
CHLORAPREP W/TINT 26ML (MISCELLANEOUS) ×2 IMPLANT
CLOTH BEACON ORANGE TIMEOUT ST (SAFETY) ×2 IMPLANT
COVER MAYO STAND STRL (DRAPES) ×2 IMPLANT
COVER SURGICAL LIGHT HANDLE (MISCELLANEOUS) ×2 IMPLANT
DRAPE LAPAROSCOPIC ABDOMINAL (DRAPES) ×2 IMPLANT
DRAPE PROXIMA HALF (DRAPES) ×4 IMPLANT
DRAPE UTILITY 15X26 W/TAPE STR (DRAPE) ×6 IMPLANT
DRAPE WARM FLUID 44X44 (DRAPE) ×2 IMPLANT
DRSG OPSITE POSTOP 4X10 (GAUZE/BANDAGES/DRESSINGS) ×2 IMPLANT
ELECT BLADE 6.5 EXT (BLADE) ×2 IMPLANT
ELECT CAUTERY BLADE 6.4 (BLADE) ×4 IMPLANT
ELECT REM PT RETURN 9FT ADLT (ELECTROSURGICAL) ×2
ELECTRODE REM PT RTRN 9FT ADLT (ELECTROSURGICAL) ×1 IMPLANT
GLOVE BIO SURGEON STRL SZ7.5 (GLOVE) ×4 IMPLANT
GLOVE BIOGEL PI IND STRL 7.0 (GLOVE) ×1 IMPLANT
GLOVE BIOGEL PI IND STRL 7.5 (GLOVE) ×1 IMPLANT
GLOVE BIOGEL PI IND STRL 8 (GLOVE) ×2 IMPLANT
GLOVE BIOGEL PI INDICATOR 7.0 (GLOVE) ×1
GLOVE BIOGEL PI INDICATOR 7.5 (GLOVE) ×1
GLOVE BIOGEL PI INDICATOR 8 (GLOVE) ×2
GLOVE ECLIPSE 7.5 STRL STRAW (GLOVE) ×6 IMPLANT
GOWN STRL NON-REIN LRG LVL3 (GOWN DISPOSABLE) ×6 IMPLANT
GOWN STRL REIN XL XLG (GOWN DISPOSABLE) ×4 IMPLANT
KIT BASIN OR (CUSTOM PROCEDURE TRAY) ×2 IMPLANT
KIT OSTOMY DRAINABLE 2.75 STR (WOUND CARE) ×2 IMPLANT
KIT ROOM TURNOVER OR (KITS) ×2 IMPLANT
LEGGING LITHOTOMY PAIR STRL (DRAPES) ×2 IMPLANT
LIGASURE IMPACT 36 18CM CVD LR (INSTRUMENTS) ×4 IMPLANT
NS IRRIG 1000ML POUR BTL (IV SOLUTION) ×8 IMPLANT
PACK GENERAL/GYN (CUSTOM PROCEDURE TRAY) ×2 IMPLANT
PAD ARMBOARD 7.5X6 YLW CONV (MISCELLANEOUS) ×2 IMPLANT
PAD SHARPS MAGNETIC DISPOSAL (MISCELLANEOUS) ×2 IMPLANT
RELOAD PROXIMATE 75MM BLUE (ENDOMECHANICALS) ×2 IMPLANT
SEPRAFILM PROCEDURAL PACK 3X5 (MISCELLANEOUS) ×2 IMPLANT
SPECIMEN JAR LARGE (MISCELLANEOUS) ×2 IMPLANT
SPECIMEN JAR X LARGE (MISCELLANEOUS) IMPLANT
SPONGE LAP 18X18 X RAY DECT (DISPOSABLE) ×6 IMPLANT
STAPLER PROXIMATE 75MM BLUE (STAPLE) ×2 IMPLANT
STAPLER VISISTAT 35W (STAPLE) ×2 IMPLANT
SUCTION POOLE TIP (SUCTIONS) ×2 IMPLANT
SURGILUBE 2OZ TUBE FLIPTOP (MISCELLANEOUS) ×2 IMPLANT
SUT CHROMIC 2 0 SH (SUTURE) ×4 IMPLANT
SUT PDS AB 1 CTX 36 (SUTURE) ×2 IMPLANT
SUT PDS AB 1 TP1 96 (SUTURE) ×4 IMPLANT
SUT PROLENE 0 CT (SUTURE) ×2 IMPLANT
SUT PROLENE 2 0 CT2 30 (SUTURE) IMPLANT
SUT PROLENE 2 0 KS (SUTURE) IMPLANT
SUT SILK 2 0 SH CR/8 (SUTURE) ×2 IMPLANT
SUT SILK 2 0 TIES 10X30 (SUTURE) ×2 IMPLANT
SUT SILK 3 0 SH CR/8 (SUTURE) ×2 IMPLANT
SUT SILK 3 0 TIES 10X30 (SUTURE) ×2 IMPLANT
SUT VIC AB 3-0 SH 18 (SUTURE) IMPLANT
SYR BULB IRRIGATION 50ML (SYRINGE) ×2 IMPLANT
TOWEL OR 17X26 10 PK STRL BLUE (TOWEL DISPOSABLE) ×4 IMPLANT
TRAY FOLEY CATH 14FRSI W/METER (CATHETERS) ×2 IMPLANT
TRAY PROCTOSCOPIC FIBER OPTIC (SET/KITS/TRAYS/PACK) ×2 IMPLANT
WATER STERILE IRR 1000ML POUR (IV SOLUTION) IMPLANT
YANKAUER SUCT BULB TIP NO VENT (SUCTIONS) ×2 IMPLANT

## 2012-03-12 NOTE — Progress Notes (Signed)
Notified NP on call that pt had a dose of Lovenox scheduled for this pm and asked if this dose should be given. Per NP order - hold Lovenox for tonight and do not re-start until restarted by surgeon. Also received order for neosporin ointment to pt R hand laceration.

## 2012-03-12 NOTE — Op Note (Signed)
Pre Operative Diagnosis: acute on Chronic diverticulitis  Post Operative Diagnosis: same  Procedure: sigmoid resection with Hartman's pouch and end colostomy  Surgeon: Dr. Axel Filler  Assistant: none  Anesthesia: GETA  EBL: 300cc  Complications: none  Counts: reported as correct x 2  Findings:  The patient had a very inflamed sigmoid colon there is minimal redundancy of the sigmoid colon.  There was also a small bowel mesenteric phlegmon that was adhered to the sigmoid colon. This was easily fractured and separated in the sigmoid colon. There was no injury to small bowel. The distal resection toko place the pelvic brim.  The Hartmann pouch was tagged with  1-0 Prolene stitches.  Seprafilm was used in the midline and omentum was brought over the small bowel prior to closure.  The end colostomy was viable the end of the case.  Indications for procedure:  The patient is an 79 year old female with a several month history of smoldering diverticulitis. Patient placed on IV antibiotics during his hospital stay continued abdominal pain and failure to resolve. The risks and benefits of sigmoid colectomy were discussed with the patient in detail and the patient decided to proceed with resection.  Details of the procedure:The patient was taken back to the operating room. The patient was placed in lithotomy position with bilateral SCDs in place. After appropriate anitbiotics were confirmed, a time-out was confirmed and all facts were verified.    A midline incision was made with 10 blade. Bovie cautery was used to maintain hemostasis dissection was taken down to the anterior fascia. This was incised bluntly and elevated and incised the Bovie cautery. At this point the abdomen was entered and the small bowel was ran from the ligament of Treitz to terminal ileum was small bowel it was noted to be adhered to the sigmoid colon from its mesentery. This was finger fractured and freed. There was also some  right-sided ovarian adhesions to the small bowel.  The ascending colon, transverse colon, and descending colon were seen to be free of disease and/or masses. The sigmoid colon was identified to be thickened and attached to the lateral sidewall. It was incised laterally and in the descending colon was freed proximally. I continued to dissect the distal sigmoid colon meticulously identifying the left ureter along its course and the lateral abdominal wall. The left ureter was protected all portions of the case. I proceeded to dissect the distal sigmoid colon below the pelvic rim where the rectum was free of disease. The mesentery of the rectum was then taken with the LigaSure, and a point the rectum was then and then transected with a 75 GIA stapler. We then proceeded to incise the mesentery of the sigmoid colon to include the sigmoid vessels with the LigaSure device proximal to the area of diverticular disease to an area of normal descending colon.  The mesentery was incised distal to a palpable left colic artery. A 75 GIA stapler was again fired across the proximal sigmoid colon the specimen was delivered. At this point the Hartmann's pouch was tagged with 1-0 Prolene stitches x2. The descending colon was mobilized proximally up to but not including the splenic flexure. The premarked left colostomy site was incised from the skin with Bovie cautery maintaining hemostasis. Was removed subcutaneously the fascia was incised in a cruciate fashion allowing 3 fingers to pass through the fascia easily. The end colostomy was brought up through the ostomy incision. The dissection area was then irrigated out with sterile saline approximately 2  L. Hemostasis was achieved.  At this time a #1 PDS was used to reapproximate the midline after the omentum was brought over the small bowel. Seprafilm was placed the midline and the posterior area. The skin was reapproximated skin staples.  The ostomy was then matured with 2-0 chromic  stitches in routine fashion, and had minimal bruising and was pink and viable.. An ostomy appliance was then applied over the ostomy.  The patient was taken to the recovery room in stable condition.

## 2012-03-12 NOTE — Anesthesia Postprocedure Evaluation (Signed)
Anesthesia Post Note  Patient: [name]  Procedure(s) Performed: Procedure(s) (LRB): COLON RESECTION SIGMOID  (N/A) COLOSTOMY (N/A)  Anesthesia type: General  Patient location: PACU  Post pain: Pain level controlled and Adequate analgesia  Post assessment: Post-op Vital signs reviewed, Patient's Cardiovascular Status Stable, Respiratory Function Stable, Patent Airway and Pain level controlled  Last Vitals:  Filed Vitals:   03/12/12 1216  BP: 119/53  Pulse:   Temp:   Resp:     Post vital signs: Reviewed and stable  Level of consciousness: awake, alert  and oriented  Complications: No apparent anesthesia complications

## 2012-03-12 NOTE — Transfer of Care (Signed)
Immediate Anesthesia Transfer of Care Note  Patient: [name]  Procedure(s) Performed: Procedure(s): COLON RESECTION SIGMOID  (N/A) COLOSTOMY (N/A)  Patient Location: PACU  Anesthesia Type:General  Level of Consciousness: awake, alert  and oriented  Airway & Oxygen Therapy: Patient Spontanous Breathing and Patient connected to face mask oxygen  Post-op Assessment: Report given to PACU RN, Post -op Vital signs reviewed and stable and Patient moving all extremities X 4  Post vital signs: Reviewed and stable  Complications: No apparent anesthesia complications

## 2012-03-12 NOTE — Anesthesia Preprocedure Evaluation (Addendum)
Anesthesia Evaluation  Patient identified by MRN, date of birth, ID band Patient awake    Reviewed: Allergy & Precautions, H&P , NPO status , Patient's Chart, lab work & pertinent test results  History of Anesthesia Complications Negative for: history of anesthetic complications  Airway Mallampati: I TM Distance: >3 FB Neck ROM: Full    Dental  (+) Edentulous Upper and Edentulous Lower   Pulmonary shortness of breath, COPD breath sounds clear to auscultation        Cardiovascular hypertension, +CHF + dysrhythmias Rhythm:Regular Rate:Normal     Neuro/Psych    GI/Hepatic Neg liver ROS, Bowel prep,  Endo/Other  Hypothyroidism   Renal/GU negative Renal ROS     Musculoskeletal   Abdominal   Peds  Hematology  (+) Blood dyscrasia, anemia ,   Anesthesia Other Findings   Reproductive/Obstetrics                          Anesthesia Physical Anesthesia Plan  ASA: III  Anesthesia Plan: General   Post-op Pain Management:    Induction: Intravenous  Airway Management Planned: Oral ETT  Additional Equipment:   Intra-op Plan:   Post-operative Plan: Extubation in OR  Informed Consent: I have reviewed the patients History and Physical, chart, labs and discussed the procedure including the risks, benefits and alternatives for the proposed anesthesia with the patient or authorized representative who has indicated his/her understanding and acceptance.   Dental advisory given  Plan Discussed with: CRNA and Surgeon  Anesthesia Plan Comments:         Anesthesia Quick Evaluation

## 2012-03-12 NOTE — Progress Notes (Signed)
PT Note: PT cancelled today due to pt in surgery, will check back tomorrow. Lyanne Co, PT  Acute Rehab Services  [phone]

## 2012-03-12 NOTE — Progress Notes (Signed)
Patient ID: [name]  female  GNF:[id number]    DOB: 06/23/1925    DOA: 03/07/2012  PCP: Kaleen Mask, MD  Assessment/Plan:  Recurrent acute sigmoid diverticulitis with intra-abdominal abscess: 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 today - Management per ccs - NPO, IVF, on morphine PCA    Hypertension: Stable    Chronic diastolic CHF (congestive heart failure) - Compensated, EF 55-60% based on 2D Echo on 02/25/2009    H/O: hypothyroidism - Stable, last TSH 1/14 normal   DVT Prophylaxis: lovenox  Code Status:  Disposition:    Subjective: Seen post-op, waking up, family at bed side  Objective: Weight change:   Intake/Output Summary (Last 24 hours) at 03/12/12 1530 Last data filed at 03/12/12 1123  Gross per 24 hour  Intake 2096.25 ml  Output    640 ml  Net 1456.25 ml   Blood pressure 116/57, pulse 67, temperature 98.4 F (36.9 C), temperature source Oral, resp. rate 28, height 5\' 1"  (1.549 m), weight 65.726 kg (144 lb 14.4 oz), SpO2 90.00%.  Physical Exam: General: somnolent post-op, not in any acute distress. CVS: S1-S2 clear, no murmur rubs or gallops Chest: clear to auscultation bilaterally, no wheezing, rales or rhonchi Abdomen:  Dressing intact, colostomy+ Extremities: no cyanosis, clubbing or edema noted bilaterally   Lab Results: Basic Metabolic Panel:  Recent Labs Lab 03/09/12 0602 03/12/12 0641 03/12/12 1025  NA 139 139 139  K 3.8 4.0 3.9  CL 102 105  --   CO2 28 26  --   GLUCOSE 89 98 142*  BUN 7 8  --   CREATININE 0.70 0.63  --   CALCIUM 9.1 8.6  --   MG  --  2.1  --    Liver Function Tests:  Recent Labs Lab 03/07/12 1319  AST 14  ALT 10  ALKPHOS 74  BILITOT 0.9  PROT 7.0  ALBUMIN 3.3*   CBC:  Recent Labs Lab 03/07/12 1319  03/09/12 0602 03/12/12 0641 03/12/12 1025  WBC 6.0  < > 4.7 5.7  --   NEUTROABS 4.0  --   --   --   --   HGB 12.4  < > 11.7* 11.4* 13.9  HCT 36.3  < >  34.5* 33.2* 41.0  MCV 87.7  < > 89.4 85.1  --   PLT 272  < > 255 224  --   < > = values in this interval not displayed. Cardiac Enzymes: No results found for this basename: CKTOTAL, CKMB, CKMBINDEX, TROPONINI,  in the last 168 hours BNP: No components found with this basename: POCBNP,  CBG: No results found for this basename: GLUCAP,  in the last 168 hours   Micro Results: Recent Results (from the past 240 hour(s))  SURGICAL PCR SCREEN     Status: None   Collection Time    03/11/12  9:28 PM      Result Value Range Status   MRSA, PCR NEGATIVE  NEGATIVE Final   Staphylococcus aureus NEGATIVE  NEGATIVE Final   Comment:            The Xpert SA Assay (FDA     approved for NASAL specimens     in patients over 54 years of age),     is one component of     a comprehensive surveillance     program.  Test performance has     been validated by [employer] for patients  greater     than or equal to 4 year old.     It is not intended     to diagnose infection nor to     guide or monitor treatment.    Studies/Results: Ct Abdomen Pelvis W Contrast  03/07/2012  *RADIOLOGY REPORT*  Clinical Data: Lower abdominal pain, history diverticulitis, CHF, COPD  CT ABDOMEN AND PELVIS WITH CONTRAST  Technique:  Multidetector CT imaging of the abdomen and pelvis was performed following the standard protocol during bolus administration of intravenous contrast. Sagittal and coronal MPR images reconstructed from axial data set.  Contrast: 80mL OMNIPAQUE IOHEXOL 300 MG/ML  SOLN Dilute oral contrast.  Comparison: 02/07/2012  Findings: Minimal atelectasis posterior right lower lobe base. Improved atelectasis left lower lobe. Mitral annular calcification noted. Bilateral renal cysts. Liver, spleen, pancreas, with kidneys, and adrenal glands otherwise normal appearance.  Stomach incompletely distended, suboptimal assessment wall thickness. Small umbilical hernia containing fat. Diverticulosis of descending and  sigmoid colon. Wall thickening identified at mid sigmoid colon with improved pericolic inflammatory changes compatible with acute diverticulitis. Extraluminal gas collections medial to the thickened sigmoid segment again seen compatible with contained perforation within the mesocolon. The presence of minimal high attenuation within this collection raises the possibility that this collection demonstrates continued communication with the adjacent sigmoid colon lumen.  No discrete drainable fluid/abscess collection identified. No free intraperitoneal air. Small bowel loops and remainder colon showed no additional acute changes. Scattered atherosclerotic calcifications. No mass, adenopathy, free to free of fluid, or hernia. Bones diffusely demineralized.  IMPRESSION: Persistent acute diverticulitis changes of the sigmoid colon with wall thickening and further improved pericolic infiltrative changes. Persistent visualization of extraluminal gas collections within the sigmoid mesocolon at site of prior diverticular abscess, containing a small amount of central high attenuation which could represent GI contrast and indicate continued communication of this collection with the sigmoid lumen. Diverticulosis of sigmoid and descending colon.   Original Report Authenticated By: Ulyses Southward, M.D.     Medications: Scheduled Meds: . [START ON 03/13/2012] enoxaparin (LOVENOX) injection  40 mg Subcutaneous Q24H  . ertapenem  1 g Intravenous Q24H  . feeding supplement  30 mL Oral QAC breakfast  . feeding supplement  1 Container Oral BID BM  . furosemide  5 mg Oral Daily  . HYDROmorphone PCA 0.3 mg/mL   Intravenous Q4H  . lisinopril  10 mg Oral Daily  . neomycin-bacitracin-polymyxin   Topical TID      LOS: 5 days   RAI,RIPUDEEP M.D. [facility] 03/12/2012, 3:30 PM Pager: [phone]  If 7PM-7AM, please contact night-coverage [email or web address] Password [email or web address]

## 2012-03-13 ENCOUNTER — Encounter (INDEPENDENT_AMBULATORY_CARE_PROVIDER_SITE_OTHER): Payer: Medicare Other | Admitting: Surgery

## 2012-03-13 LAB — BASIC METABOLIC PANEL
BUN: 12 mg/dL (ref 6–23)
Creatinine, Ser: 0.96 mg/dL (ref 0.50–1.10)
GFR calc Af Amer: 61 mL/min — ABNORMAL LOW (ref >90)
GFR calc non Af Amer: 53 mL/min — ABNORMAL LOW (ref >90)

## 2012-03-13 LAB — CBC
HCT: 37.6 % (ref 36.0–46.0)
MCH: 28.9 pg (ref 26.0–34.0)
MCHC: 33.2 g/dL (ref 30.0–36.0)
RDW: 13.4 % (ref 11.5–15.5)

## 2012-03-13 MED ORDER — WHITE PETROLATUM GEL
Status: AC
  Administered 2012-03-13: 0.2
  Filled 2012-03-13: qty 5

## 2012-03-13 NOTE — Progress Notes (Signed)
NUTRITION FOLLOW UP  Intervention:   1.  Modify diet; per MD discretion to Regular goal. 2.  Nutrition support; consider early nutrition support for pt if diet unable to be advanced with 48 hrs.  Pt meets criteria for severe malnutrition, now with post-op ileus.    Nutrition Dx:   Inadequate oral intake, ongoing  Monitor:   1. Food/Beverage; diet advancement with tolerance  2. Wt/wt change; monitor trends  Assessment:   Pt admitted with abdominal pain, found to have recurrent flare-up of diverticulitis with small prediverticular abscess which is recurring.  Pt is now POD 1. Now with post-op ileus, awaiting return of bowel function  Pt has lost approximately 30 lbs in the last 15 months r/t recurrence. Pt has lost 10 lbs since last admission ~1 months ago (6.9% wt change in 1 month). Pt reports she has been eating less as well. She states she is kept on liquids diet during hospitalizations and then has been able to up her intake to "about a cup or so" at meals.  Pt qualifies for severe malnutrition of chronic illness based on degree of wt loss and poor intake >1 month.    Height: Ht Readings from Last 1 Encounters:  03/07/12 5\' 1"  (1.549 m)    Weight Status:   Wt Readings from Last 1 Encounters:  03/07/12 144 lb 14.4 oz (65.726 kg)    Re-estimated needs:  Kcal: 1640-1820 Protein: 75-90g Fluid: >1.8 L/day  Skin: wnl  Diet Order: NPO   Intake/Output Summary (Last 24 hours) at 03/13/12 1150 Last data filed at 03/13/12 0605  Gross per 24 hour  Intake 1212.58 ml  Output    400 ml  Net 812.58 ml    Last BM: 3/12   Labs:   Recent Labs Lab 03/09/12 0602 03/12/12 0641 03/12/12 1025 03/12/12 1502 03/13/12 0655  NA 139 139 139  --  141  K 3.8 4.0 3.9  --  4.3  CL 102 105  --   --  107  CO2 28 26  --   --  25  BUN 7 8  --   --  12  CREATININE 0.70 0.63  --  0.70 0.96  CALCIUM 9.1 8.6  --   --  8.4  MG  --  2.1  --   --   --   GLUCOSE 89 98 142*  --  145*     CBG (last 3)  No results found for this basename: GLUCAP,  in the last 72 hours  Scheduled Meds: . antiseptic oral rinse  15 mL Mouth Rinse q12n4p  . chlorhexidine  15 mL Mouth Rinse BID  . enoxaparin (LOVENOX) injection  40 mg Subcutaneous Q24H  . ertapenem  1 g Intravenous Q24H  . feeding supplement  30 mL Oral QAC breakfast  . feeding supplement  1 Container Oral BID BM  . furosemide  5 mg Oral Daily  . HYDROmorphone PCA 0.3 mg/mL   Intravenous Q4H  . lisinopril  10 mg Oral Daily  . neomycin-bacitracin-polymyxin   Topical TID    Continuous Infusions: . dextrose 5 % and 0.9% NaCl 75 mL/hr at 03/13/12 0553    Loyce Dys, MS RD LDN Clinical Inpatient Dietitian Pager: [phone] Weekend/After hours pager: [phone]

## 2012-03-13 NOTE — Progress Notes (Signed)
PT Cancellation Note  Patient Details Name: [name] MRN: [id number] DOB: 1925-01-22   Cancelled Treatment:    Reason Eval/Treat Not Completed: Pain limiting ability to participate (Per sx pa; hold until 3/14)   [name] J 03/13/2012, 1:40 PM

## 2012-03-13 NOTE — Progress Notes (Signed)
1 Day Post-Op  Subjective: Pt feeling good, but c/o pain with even small movements of her abdomen.  Pt thirsty, and denies N/V.  No gas or ostomy output.  Pt not ambulating yet, but PT to start working with her tomorrow.  Objective: Vital signs in last 24 hours: Temp:  [97.1 F (36.2 C)-99.1 F (37.3 C)] 98.2 F (36.8 C) (03/13 0953) Pulse Rate:  [61-79] 73 (03/13 0953) Resp:  [9-28] 20 (03/13 0953) BP: (113-138)/(52-87) 119/52 mmHg (03/13 0953) SpO2:  [90 %-98 %] 93 % (03/13 0953) Last BM Date: 03/12/12  Intake/Output from previous day: 03/12 0701 - 03/13 0700 In: 2712.6 [I.V.:2662.6; IV Piggyback:50] Out: 1040 [Urine:540; Blood:500] Intake/Output this shift:    PE: Gen:  Alert, NAD, pleasant Abd: Soft, moderate tenderness, ND, +BS, no HSM, ostomy pink without gas or output yet   Lab Results:   Recent Labs  03/12/12 1502 03/13/12 0655  WBC 15.2* 13.9*  HGB 13.0 12.5  HCT 38.1 37.6  PLT 285 296   BMET  Recent Labs  03/12/12 0641 03/12/12 1025 03/12/12 1502 03/13/12 0655  NA 139 139  --  141  K 4.0 3.9  --  4.3  CL 105  --   --  107  CO2 26  --   --  25  GLUCOSE 98 142*  --  145*  BUN 8  --   --  12  CREATININE 0.63  --  0.70 0.96  CALCIUM 8.6  --   --  8.4   PT/INR No results found for this basename: LABPROT, INR,  in the last 72 hours CMP     Component Value Date/Time   NA 141 03/13/2012 0655   K 4.3 03/13/2012 0655   CL 107 03/13/2012 0655   CO2 25 03/13/2012 0655   GLUCOSE 145* 03/13/2012 0655   BUN 12 03/13/2012 0655   CREATININE 0.96 03/13/2012 0655   CREATININE 0.70 01/14/2012 1330   CALCIUM 8.4 03/13/2012 0655   PROT 7.0 03/07/2012 1319   ALBUMIN 3.3* 03/07/2012 1319   AST 14 03/07/2012 1319   ALT 10 03/07/2012 1319   ALKPHOS 74 03/07/2012 1319   BILITOT 0.9 03/07/2012 1319   GFRNONAA 53* 03/13/2012 0655   GFRAA 61* 03/13/2012 0655   Lipase  No results found for this basename: lipase       Studies/Results: No results  found.  Anti-infectives: Anti-infectives   Start     Dose/Rate Route Frequency Ordered Stop   03/08/12 1900  ertapenem (INVANZ) 1 g in sodium chloride 0.9 % 50 mL IVPB     1 g 100 mL/hr over 30 Minutes Intravenous Every 24 hours 03/08/12 1408     03/07/12 1800  ertapenem (INVANZ) 1 g in sodium chloride 0.9 % 50 mL IVPB     1 g 100 mL/hr over 30 Minutes Intravenous To Emergency Dept 03/07/12 1721 03/07/12 1955   03/07/12 1730  ertapenem (INVANZ) 1 g in sodium chloride 0.9 % 50 mL IVPB  Status:  Discontinued     1 g 100 mL/hr over 30 Minutes Intravenous  Once 03/07/12 1716 03/07/12 1717       Assessment/Plan 1. Recurrent diverticulitis with perforation prior hospitalizations 01/14/12, and 02/07/12 for the same problem.   S/p sigmoid resection with Hartman's pouch and end colostomy - Post-operative ileus  Cont NPO, IVF, await return of bowel function  PT/OT starting tomorrow to start mobilizing  Cont catheter today due to significant pain - try to d/c tomorrow  Hold PO meds until taking PO 2. COPD/remote history of tobacco use/chronic wheezing.  3. History of congestive heart failure/hospitalized 6 or more years ago for this.  4. Chronic shortness of breath since January 2014.  5. History of goiter/radioiodine therapy/not currently on Thyroid replacement  6. Hospitalized for pneumonia in January 2014.  7. Arthritis     LOS: 6 days    DORT, MEGAN 03/13/2012, 11:23 AM Pager: [phone]

## 2012-03-13 NOTE — Progress Notes (Signed)
I have seen and examined the pt and agree with PA-Dort's PN Pt doing well today. Await bowel function Will need ostomy education

## 2012-03-13 NOTE — Consult Note (Signed)
WOC ostomy consult  Stoma type/location: Colostomy stoma to left lower quad on 3/12.  Pt first day post-op; pouch intact with good seal and not removed at this time.   Stomal assessment/size: Appears to be flush with skin, red and viable when visualized through pouch. Output No stool or flatus at this time. Ostomy pouching: 1pc Education provided: Pt in pain since first post-op day.  States she will need assistance with pouching activities after discharge.  Recommend home health follow-up.  Son will be here tomorrow at 1000 for pouch change demonstration according to family member at bedside.  Supplies ordered to room for staff use. Cammie Mcgee MSN, RN, CWOCN, [place], CNS [phone]

## 2012-03-13 NOTE — Progress Notes (Signed)
Patient ID: [name]  female  ZOX:[id number]    DOB: April 03, 1925    DOA: 03/07/2012  PCP: Kaleen Mask, MD  Assessment/Plan:  Recurrent acute sigmoid diverticulitis with intra-abdominal abscess: 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 today - Management per ccs - NPO, IVF, on Dilaudid PCA    Hypertension: Stable   History of Chronic diastolic CHF (congestive heart failure): Currently stable and compensated - Compensated, EF 55-60% based on 2D Echo on 02/25/2009    H/O: hypothyroidism - Stable, last TSH 1/14 normal, the patient is not on any replacement  History of COPD currently not wheezing and stable:   DVT Prophylaxis: lovenox  Code Status:  Disposition:    Subjective: "i am thirsty'   Objective: Weight change:   Intake/Output Summary (Last 24 hours) at 03/13/12 1410 Last data filed at 03/13/12 0605  Gross per 24 hour  Intake 1212.58 ml  Output    400 ml  Net 812.58 ml   Blood pressure 119/52, pulse 73, temperature 98.2 F (36.8 C), temperature source Oral, resp. rate 22, height 5\' 1"  (1.549 m), weight 65.726 kg (144 lb 14.4 oz), SpO2 97.00%.  Physical Exam: General: Alert and oriented, daughter at bedside CVS: S1-S2 clear, no murmur rubs or gallops Chest: clear to auscultation bilaterally, no wheezing, rales or rhonchi Abdomen:  Dressing intact, colostomy+ Extremities: no c/c/e   Lab Results: Basic Metabolic Panel:  Recent Labs Lab 03/12/12 0641 03/12/12 1025 03/12/12 1502 03/13/12 0655  NA 139 139  --  141  K 4.0 3.9  --  4.3  CL 105  --   --  107  CO2 26  --   --  25  GLUCOSE 98 142*  --  145*  BUN 8  --   --  12  CREATININE 0.63  --  0.70 0.96  CALCIUM 8.6  --   --  8.4  MG 2.1  --   --   --    Liver Function Tests:  Recent Labs Lab 03/07/12 1319  AST 14  ALT 10  ALKPHOS 74  BILITOT 0.9  PROT 7.0  ALBUMIN 3.3*   CBC:  Recent Labs Lab 03/07/12 1319  03/12/12 1502 03/13/12 0655  WBC  6.0  < > 15.2* 13.9*  NEUTROABS 4.0  --   --   --   HGB 12.4  < > 13.0 12.5  HCT 36.3  < > 38.1 37.6  MCV 87.7  < > 86.2 87.0  PLT 272  < > 285 296  < > = values in this interval not displayed. Cardiac Enzymes: No results found for this basename: CKTOTAL, CKMB, CKMBINDEX, TROPONINI,  in the last 168 hours BNP: No components found with this basename: POCBNP,  CBG: No results found for this basename: GLUCAP,  in the last 168 hours   Micro Results: Recent Results (from the past 240 hour(s))  SURGICAL PCR SCREEN     Status: None   Collection Time    03/11/12  9:28 PM      Result Value Range Status   MRSA, PCR NEGATIVE  NEGATIVE Final   Staphylococcus aureus NEGATIVE  NEGATIVE Final   Comment:            The Xpert SA Assay (FDA     approved for NASAL specimens     in patients over 9 years of age),     is one component of     a comprehensive surveillance  program.  Test performance has     been validated by [facility] for patients greater     than or equal to 77 year old.     It is not intended     to diagnose infection nor to     guide or monitor treatment.    Studies/Results: Ct Abdomen Pelvis W Contrast  03/07/2012  *RADIOLOGY REPORT*  Clinical Data: Lower abdominal pain, history diverticulitis, CHF, COPD  CT ABDOMEN AND PELVIS WITH CONTRAST  Technique:  Multidetector CT imaging of the abdomen and pelvis was performed following the standard protocol during bolus administration of intravenous contrast. Sagittal and coronal MPR images reconstructed from axial data set.  Contrast: 80mL OMNIPAQUE IOHEXOL 300 MG/ML  SOLN Dilute oral contrast.  Comparison: 02/07/2012  Findings: Minimal atelectasis posterior right lower lobe base. Improved atelectasis left lower lobe. Mitral annular calcification noted. Bilateral renal cysts. Liver, spleen, pancreas, with kidneys, and adrenal glands otherwise normal appearance.  Stomach incompletely distended, suboptimal assessment wall  thickness. Small umbilical hernia containing fat. Diverticulosis of descending and sigmoid colon. Wall thickening identified at mid sigmoid colon with improved pericolic inflammatory changes compatible with acute diverticulitis. Extraluminal gas collections medial to the thickened sigmoid segment again seen compatible with contained perforation within the mesocolon. The presence of minimal high attenuation within this collection raises the possibility that this collection demonstrates continued communication with the adjacent sigmoid colon lumen.  No discrete drainable fluid/abscess collection identified. No free intraperitoneal air. Small bowel loops and remainder colon showed no additional acute changes. Scattered atherosclerotic calcifications. No mass, adenopathy, free to free of fluid, or hernia. Bones diffusely demineralized.  IMPRESSION: Persistent acute diverticulitis changes of the sigmoid colon with wall thickening and further improved pericolic infiltrative changes. Persistent visualization of extraluminal gas collections within the sigmoid mesocolon at site of prior diverticular abscess, containing a small amount of central high attenuation which could represent GI contrast and indicate continued communication of this collection with the sigmoid lumen. Diverticulosis of sigmoid and descending colon.   Original Report Authenticated By: Ulyses Southward, M.D.     Medications: Scheduled Meds: . antiseptic oral rinse  15 mL Mouth Rinse q12n4p  . chlorhexidine  15 mL Mouth Rinse BID  . enoxaparin (LOVENOX) injection  40 mg Subcutaneous Q24H  . ertapenem  1 g Intravenous Q24H  . feeding supplement  30 mL Oral QAC breakfast  . feeding supplement  1 Container Oral BID BM  . furosemide  5 mg Oral Daily  . HYDROmorphone PCA 0.3 mg/mL   Intravenous Q4H  . lisinopril  10 mg Oral Daily  . neomycin-bacitracin-polymyxin   Topical TID      LOS: 6 days   RAI,RIPUDEEP M.D. [facility] 03/13/2012, 2:10 PM Pager: [phone]  If 7PM-7AM, please contact night-coverage [email or web address] Password [email or web address]

## 2012-03-14 LAB — CREATININE, SERUM
Creatinine, Ser: 0.83 mg/dL (ref 0.50–1.10)
GFR calc non Af Amer: 63 mL/min — ABNORMAL LOW (ref >90)

## 2012-03-14 NOTE — Progress Notes (Signed)
Patient ID: [name]  female  ZOX:[id number]    DOB: 07-Jun-1925    DOA: 03/07/2012  PCP: Kaleen Mask, MD  Assessment/Plan:  Recurrent acute sigmoid diverticulitis with intra-abdominal abscess: with post op ileus 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  - Management per ccs - NPO, IVF, on Dilaudid PCA    Hypertension: Stable   History of Chronic diastolic CHF (congestive heart failure): Currently stable and compensated - Compensated, EF 55-60% based on 2D Echo on 02/25/2009    H/O: hypothyroidism - Stable, last TSH 1/14 normal, the patient is not on any replacement  History of COPD currently not wheezing and stable:   DVT Prophylaxis: lovenox  Code Status:  Disposition: PT/OT -> home health PT    Subjective: RN teaching colostomy care  Objective: Weight change:   Intake/Output Summary (Last 24 hours) at 03/14/12 1334 Last data filed at 03/14/12 0700  Gross per 24 hour  Intake 1802.5 ml  Output    450 ml  Net 1352.5 ml   Blood pressure 138/56, pulse 63, temperature 98.4 F (36.9 C), temperature source Oral, resp. rate 21, height 5\' 1"  (1.549 m), weight 65.726 kg (144 lb 14.4 oz), SpO2 93.00%.  Physical Exam: General: AXO3 CVS: S1-S2 clear Chest: CTAB Abdomen:  Dressing intact, colostomy+ Extremities: no c/c/e   Lab Results: Basic Metabolic Panel:  Recent Labs Lab 03/12/12 0641 03/12/12 1025  03/13/12 0655 03/14/12 0500  NA 139 139  --  141  --   K 4.0 3.9  --  4.3  --   CL 105  --   --  107  --   CO2 26  --   --  25  --   GLUCOSE 98 142*  --  145*  --   BUN 8  --   --  12  --   CREATININE 0.63  --   < > 0.96 0.83  CALCIUM 8.6  --   --  8.4  --   MG 2.1  --   --   --   --   < > = values in this interval not displayed. Liver Function Tests: No results found for this basename: AST, ALT, ALKPHOS, BILITOT, PROT, ALBUMIN,  in the last 168 hours CBC:  Recent Labs Lab 03/12/12 1502 03/13/12 0655  WBC 15.2*  13.9*  HGB 13.0 12.5  HCT 38.1 37.6  MCV 86.2 87.0  PLT 285 296   Cardiac Enzymes: No results found for this basename: CKTOTAL, CKMB, CKMBINDEX, TROPONINI,  in the last 168 hours BNP: No components found with this basename: POCBNP,  CBG: No results found for this basename: GLUCAP,  in the last 168 hours   Micro Results: Recent Results (from the past 240 hour(s))  SURGICAL PCR SCREEN     Status: None   Collection Time    03/11/12  9:28 PM      Result Value Range Status   MRSA, PCR NEGATIVE  NEGATIVE Final   Staphylococcus aureus NEGATIVE  NEGATIVE Final   Comment:            The Xpert SA Assay (FDA     approved for NASAL specimens     in patients over 69 years of age),     is one component of     a comprehensive surveillance     program.  Test performance has     been validated by [employer] for patients greater  than or equal to 76 year old.     It is not intended     to diagnose infection nor to     guide or monitor treatment.    Studies/Results: Ct Abdomen Pelvis W Contrast  03/07/2012  *RADIOLOGY REPORT*  Clinical Data: Lower abdominal pain, history diverticulitis, CHF, COPD  CT ABDOMEN AND PELVIS WITH CONTRAST  Technique:  Multidetector CT imaging of the abdomen and pelvis was performed following the standard protocol during bolus administration of intravenous contrast. Sagittal and coronal MPR images reconstructed from axial data set.  Contrast: 80mL OMNIPAQUE IOHEXOL 300 MG/ML  SOLN Dilute oral contrast.  Comparison: 02/07/2012  Findings: Minimal atelectasis posterior right lower lobe base. Improved atelectasis left lower lobe. Mitral annular calcification noted. Bilateral renal cysts. Liver, spleen, pancreas, with kidneys, and adrenal glands otherwise normal appearance.  Stomach incompletely distended, suboptimal assessment wall thickness. Small umbilical hernia containing fat. Diverticulosis of descending and sigmoid colon. Wall thickening identified at mid  sigmoid colon with improved pericolic inflammatory changes compatible with acute diverticulitis. Extraluminal gas collections medial to the thickened sigmoid segment again seen compatible with contained perforation within the mesocolon. The presence of minimal high attenuation within this collection raises the possibility that this collection demonstrates continued communication with the adjacent sigmoid colon lumen.  No discrete drainable fluid/abscess collection identified. No free intraperitoneal air. Small bowel loops and remainder colon showed no additional acute changes. Scattered atherosclerotic calcifications. No mass, adenopathy, free to free of fluid, or hernia. Bones diffusely demineralized.  IMPRESSION: Persistent acute diverticulitis changes of the sigmoid colon with wall thickening and further improved pericolic infiltrative changes. Persistent visualization of extraluminal gas collections within the sigmoid mesocolon at site of prior diverticular abscess, containing a small amount of central high attenuation which could represent GI contrast and indicate continued communication of this collection with the sigmoid lumen. Diverticulosis of sigmoid and descending colon.   Original Report Authenticated By: Ulyses Southward, M.D.     Medications: Scheduled Meds: . antiseptic oral rinse  15 mL Mouth Rinse q12n4p  . chlorhexidine  15 mL Mouth Rinse BID  . enoxaparin (LOVENOX) injection  40 mg Subcutaneous Q24H  . ertapenem  1 g Intravenous Q24H  . feeding supplement  30 mL Oral QAC breakfast  . feeding supplement  1 Container Oral BID BM  . furosemide  5 mg Oral Daily  . HYDROmorphone PCA 0.3 mg/mL   Intravenous Q4H  . lisinopril  10 mg Oral Daily      LOS: 7 days   RAI,RIPUDEEP M.D. [facility] 03/14/2012, 1:34 PM Pager: [phone]  If 7PM-7AM, please contact night-coverage [email or web address] Password [email or web address]

## 2012-03-14 NOTE — Progress Notes (Signed)
Pt had medium amount of liquid brown/green stool drain from rectum.

## 2012-03-14 NOTE — Progress Notes (Signed)
Occupational Therapy Evaluation Patient Details Name: [name] MRN: [id number] DOB: 26-Jul-1925 Today's Date: 03/14/2012 Time: 1610-1640 OT Time Calculation (min): 30 min  OT Assessment / Plan / Recommendation Clinical Impression  79 yo s/p colostomy. PTA, pt lived independently alone. Daughter assisted with errands,etc. Pt with poor performance this pm. Stated she was only able to stand 1 time with earlier PT session. If pt can progress to S level with mobility, then she will be able to D/C home only if 24.7 S can be set up. If this is not an option, pt will need ST SNF for rehab to return to PLOF.     OT Assessment  Patient needs continued OT Services    Follow Up Recommendations  Home health OT;SNF;Other (comment) (pending progress)    Barriers to Discharge None    Equipment Recommendations  None recommended by OT    Recommendations for Other Services    Frequency  Min 2X/week    Precautions / Restrictions Precautions Precautions: Fall;Other (comment) (colostomy) Precaution Comments: colostomy   Pertinent Vitals/Pain 7. Abdomen PCA.    ADL  Grooming: Minimal assistance Where Assessed - Grooming: Supported sitting Upper Body Bathing: Moderate assistance Where Assessed - Upper Body Bathing: Supported sitting Lower Body Bathing: Maximal assistance Where Assessed - Lower Body Bathing: Supported sit to stand Upper Body Dressing: Moderate assistance Where Assessed - Upper Body Dressing: Supported sitting Lower Body Dressing: Maximal assistance Where Assessed - Lower Body Dressing: Supported sit to Pharmacist, hospital: Maximal assistance Toilet Transfer Method: Sit to stand;Stand pivot Acupuncturist: Other (comment) (bed - chair) Toileting - Clothing Manipulation and Hygiene: +1 Total assistance Where Assessed - Toileting Clothing Manipulation and Hygiene: Sit to stand from 3-in-1 or toilet Transfers/Ambulation Related to ADLs: Max A ADL Comments: limited  by pain and weakness    OT Diagnosis: Generalized weakness;Acute pain  OT Problem List: Decreased strength;Decreased activity tolerance;Impaired balance (sitting and/or standing);Decreased safety awareness;Decreased knowledge of use of DME or AE;Decreased knowledge of precautions;Cardiopulmonary status limiting activity;Obesity;Pain OT Treatment Interventions: Self-care/ADL training;Therapeutic exercise;Energy conservation;DME and/or AE instruction;Therapeutic activities;Patient/family education   OT Goals Acute Rehab OT Goals OT Goal Formulation: With patient Time For Goal Achievement: 03/28/12 Potential to Achieve Goals: Good ADL Goals Pt Will Perform Lower Body Bathing: with min assist;with caregiver independent in assisting;Sit to stand from chair;Supported;with adaptive equipment;with cueing (comment type and amount) ADL Goal: Lower Body Bathing - Progress: Goal set today Pt Will Transfer to Toilet: with supervision;with caregiver independent in assisting;Ambulation;with DME;3-in-1 ADL Goal: Toilet Transfer - Progress: Goal set today Pt Will Perform Toileting - Clothing Manipulation: with supervision;with caregiver independent in assisting;Standing ADL Goal: Toileting - Clothing Manipulation - Progress: Goal set today Pt Will Perform Toileting - Hygiene: with supervision;Sit to stand from 3-in-1/toilet ADL Goal: Toileting - Hygiene - Progress: Goal set today Additional ADL Goal #1: Pt will complete functional mobility for ADL task @ RW level utilizing 2 E conservation techniques ADL Goal: Additional Goal #1 - Progress: Goal set today  Visit Information  Last OT Received On: 03/14/12 Assistance Needed: +1    Subjective Data      Prior Functioning     Home Living Lives With: Alone Available Help at Discharge: Family;Available PRN/intermittently Type of Home: House Home Access: Stairs to enter [employer] of Steps: 2 Entrance Stairs-Rails: None Home Layout: One  level Bathroom Shower/Tub: Engineer, manufacturing systems: Handicapped height Bathroom Accessibility: Yes How Accessible: Accessible via walker Home Adaptive Equipment: Dan Humphreys - four wheeled;Shower chair  with back;Straight cane Prior Function Level of Independence: Independent with assistive device(s) Able to Take Stairs?: Yes Driving: Yes Vocation: Retired         Haematologist Overall Cognitive Status: Appears within functional limits for tasks assessed/performed Arousal/Alertness: Awake/alert Orientation Level: Appears intact for tasks assessed Behavior During Session: [facility] for tasks performed    Extremity/Trunk Assessment Right Upper Extremity Assessment RUE ROM/Strength/Tone: [facility] for tasks assessed Left Upper Extremity Assessment LUE ROM/Strength/Tone: WFL for tasks assessed     Mobility Bed Mobility Bed Mobility: Rolling Left;Left Sidelying to Sit Rolling Left: 4: Min assist Left Sidelying to Sit: 3: Mod assist;HOB elevated Transfers Transfers: Sit to Stand;Stand to Sit Sit to Stand: From bed;1: +2 Total assist Sit to Stand: Patient Percentage: 50% Stand to Sit: 1: +2 Total assist;With upper extremity assist;To chair/3-in-1 Stand to Sit: Patient Percentage: 60% Details for Transfer Assistance: Pt fatigued this pm and required 3 attempts to stand and stand pivot to chair. Pt trying to sit without chair behind her     Exercise     Balance  mod A   End of Session OT - End of Session Equipment Utilized During Treatment: Gait belt Activity Tolerance: Patient limited by fatigue;Patient limited by pain Patient left: in chair;with call bell/phone within reach;with family/visitor present Nurse Communication: Mobility status  GO     WARD,HILLARY 03/14/2012, 5:00 PM [facility], OTR/L  [phone] 03/14/2012

## 2012-03-14 NOTE — Consult Note (Addendum)
WOC ostomy consult  Stoma type/location:  Colostomy to left lower quad. Stomal assessment/size:1 1/4 inches, red and viable, flush with skin level.  Peristomal assessment: Intact skin surrounding. Output No stool or flautus at present. Ostomy pouching: 1pc. Education provided: Son at bedside for teaching session. Demonstrated pouch application using one piece flexible pouch and barrier ring to maintain seal.  Pt able to open and close with velcro to empty.  Discussed pouching routines and ordering supplies.  Placed on [place] discharge program. Educational materials left at bedside for pt and family. Supplies at bedside for staff use. [phone]

## 2012-03-14 NOTE — Progress Notes (Signed)
Physical Therapy Treatment Patient Details Name: CEAIRA ERNSTER MRN: [id number] DOB: 1925-05-02 Today's Date: 03/14/2012 Time: 7829-5621 PT Time Calculation (min): 44 min  PT Assessment / Plan / Recommendation Comments on Treatment Session  Pt very limited today due to pain level. Pt attempted to participate and was able to stand with RW min assist. Pt reported pain level 10/10 and used the PCA one time and reported little change in pain level. I feel once pain is better controlled pt will return to prior level and be safe to d/c home with family and HHPT services.    Follow Up Recommendations  Home health PT     Does the patient have the potential to tolerate intense rehabilitation     Barriers to Discharge        Equipment Recommendations       Recommendations for Other Services    Frequency     Plan Discharge plan remains appropriate;Frequency remains appropriate    Precautions / Restrictions Precautions Precautions: Other (comment) Precaution Comments: colostomy   Pertinent Vitals/Pain     Mobility  Bed Mobility Bed Mobility: Not assessed Transfers Transfers: Sit to Stand Sit to Stand: 4: Min assist;With armrests;From chair/3-in-1 Stand to Sit: 4: Min assist (2 trials for strengthening and activity tolerance) Details for Transfer Assistance: cues for hand placement and forward trunk flexion Ambulation/Gait Ambulation/Gait Assistance: Not tested (comment) (NT due to pain level)    Exercises General Exercises - Lower Extremity Ankle Circles/Pumps: AROM;Both;10 reps;Supine;Strengthening Quad Sets: AROM;Strengthening;Both;10 reps;Supine Long Arc Quad: AROM;Strengthening;Both;10 reps;Seated Heel Slides: AAROM;Strengthening;Both;10 reps;Supine Hip ABduction/ADduction: AROM;Strengthening;Both;10 reps;Supine   PT Diagnosis:    PT Problem List:   PT Treatment Interventions:     PT Goals Acute Rehab PT Goals PT Goal: Sit to Stand - Progress: Progressing toward goal PT  Goal: Ambulate - Progress: Not progressing (due to pain)  Visit Information  Last PT Received On: 03/14/12 Assistance Needed: +1    Subjective Data  Subjective: I am in a lot of pain   Cognition  Cognition Overall Cognitive Status: Appears within functional limits for tasks assessed/performed Arousal/Alertness: Awake/alert Orientation Level: Appears intact for tasks assessed Behavior During Session: Restless (due to pain)    Balance     End of Session PT - End of Session Activity Tolerance: Patient limited by pain Patient left: in chair;with call bell/phone within reach;with family/visitor present Nurse Communication: Mobility status   GP     Greggory Stallion 03/14/2012, 9:42 AM

## 2012-03-14 NOTE — Progress Notes (Signed)
I have seen and examined the pt and agree with Dr. Janeece Riggers note -Mobilize -Await bowel function -ostomy RN has began to provide education

## 2012-03-14 NOTE — Progress Notes (Signed)
2 Days Post-Op  Subjective: Pt feeling good, but c/o pain with even small movements of her abdomen.  Pt remains thirsty, and denies N/V.  No gas or ostomy output. Small amount of rectal leaking with reported tenesmus.  Pt not ambulating yet, but PT to start working with her today.  Objective: Vital signs in last 24 hours: Temp:  [97.7 F (36.5 C)-99.1 F (37.3 C)] 98.4 F (36.9 C) (03/14 0620) Pulse Rate:  [63-87] 63 (03/14 0620) Resp:  [17-28] 19 (03/14 0620) BP: (119-146)/(52-69) 138/56 mmHg (03/14 0620) SpO2:  [92 %-97 %] 93 % (03/14 0620) Last BM Date: 03/12/12  Intake/Output from previous day: 03/13 0701 - 03/14 0700 In: 1802.5 [I.V.:1752.5; IV Piggyback:50] Out: 450 [Urine:450] Intake/Output this shift:    PE: Gen:  Alert, NAD, pleasant, laying supine in bed-side chair, VSS Abd: Soft, moderate tenderness, ND, +BS, tympanic over epigastrum, ostomy pink without gas or output yet   Lab Results:   Recent Labs  03/12/12 1502 03/13/12 0655  WBC 15.2* 13.9*  HGB 13.0 12.5  HCT 38.1 37.6  PLT 285 296   BMET  Recent Labs  03/12/12 0641 03/12/12 1025  03/13/12 0655 03/14/12 0500  NA 139 139  --  141  --   K 4.0 3.9  --  4.3  --   CL 105  --   --  107  --   CO2 26  --   --  25  --   GLUCOSE 98 142*  --  145*  --   BUN 8  --   --  12  --   CREATININE 0.63  --   < > 0.96 0.83  CALCIUM 8.6  --   --  8.4  --   < > = values in this interval not displayed. PT/INR No results found for this basename: LABPROT, INR,  in the last 72 hours CMP     Component Value Date/Time   NA 141 03/13/2012 0655   K 4.3 03/13/2012 0655   CL 107 03/13/2012 0655   CO2 25 03/13/2012 0655   GLUCOSE 145* 03/13/2012 0655   BUN 12 03/13/2012 0655   CREATININE 0.83 03/14/2012 0500   CREATININE 0.70 01/14/2012 1330   CALCIUM 8.4 03/13/2012 0655   PROT 7.0 03/07/2012 1319   ALBUMIN 3.3* 03/07/2012 1319   AST 14 03/07/2012 1319   ALT 10 03/07/2012 1319   ALKPHOS 74 03/07/2012 1319   BILITOT 0.9 03/07/2012  1319   GFRNONAA 63* 03/14/2012 0500   GFRAA 73* 03/14/2012 0500   Lipase  No results found for this basename: lipase       Studies/Results: No results found.  Anti-infectives: Anti-infectives   Start     Dose/Rate Route Frequency Ordered Stop   03/08/12 1900  ertapenem (INVANZ) 1 g in sodium chloride 0.9 % 50 mL IVPB     1 g 100 mL/hr over 30 Minutes Intravenous Every 24 hours 03/08/12 1408     03/07/12 1800  ertapenem (INVANZ) 1 g in sodium chloride 0.9 % 50 mL IVPB     1 g 100 mL/hr over 30 Minutes Intravenous To Emergency Dept 03/07/12 1721 03/07/12 1955   03/07/12 1730  ertapenem (INVANZ) 1 g in sodium chloride 0.9 % 50 mL IVPB  Status:  Discontinued     1 g 100 mL/hr over 30 Minutes Intravenous  Once 03/07/12 1716 03/07/12 1717       Assessment/Plan 1. Recurrent diverticulitis with perforation prior hospitalizations 01/14/12, and 02/07/12  for the same problem.   S/p sigmoid resection with Hartman's pouch and end colostomy - Post-operative ileus  Cont NPO, IVF, await return of bowel function  PT/OT starting today to start mobilizing  Agree with cont catheter today due to significant pain and decreased mobility  Hold PO meds until taking PO  Consider KUB 2. COPD/remote history of tobacco use/chronic wheezing.  3. History of congestive heart failure/hospitalized 6 or more years ago for this.  4. Chronic shortness of breath since January 2014.  5. History of goiter/radioiodine therapy/not currently on Thyroid replacement  6. Hospitalized for pneumonia in January 2014.  7. Arthritis     LOS: 7 days   Andrena Mews, DO [facility] Resident - PGY-2 03/14/2012 9:02 AM Pager: [phone]

## 2012-03-15 ENCOUNTER — Encounter (HOSPITAL_COMMUNITY): Payer: Self-pay | Admitting: General Surgery

## 2012-03-15 DIAGNOSIS — I4949 Other premature depolarization: Secondary | ICD-10-CM

## 2012-03-15 LAB — CBC
HCT: 32.2 % — ABNORMAL LOW (ref 36.0–46.0)
MCH: 31.7 pg (ref 26.0–34.0)
MCHC: 34.2 g/dL (ref 30.0–36.0)
RDW: 13.9 % (ref 11.5–15.5)

## 2012-03-15 LAB — BASIC METABOLIC PANEL
BUN: 14 mg/dL (ref 6–23)
Chloride: 111 mEq/L (ref 96–112)
Creatinine, Ser: 0.62 mg/dL (ref 0.50–1.10)
GFR calc Af Amer: >90 mL/min (ref >90)
Glucose, Bld: 128 mg/dL — ABNORMAL HIGH (ref 70–99)

## 2012-03-15 MED ORDER — LISINOPRIL 20 MG PO TABS
20.0000 mg | ORAL_TABLET | Freq: Every day | ORAL | Status: DC
  Administered 2012-03-16 – 2012-03-24 (×8): 20 mg via ORAL
  Filled 2012-03-15 (×9): qty 1

## 2012-03-15 MED ORDER — HYDROCODONE-ACETAMINOPHEN 5-325 MG PO TABS
1.0000 | ORAL_TABLET | ORAL | Status: DC | PRN
  Administered 2012-03-15 – 2012-03-16 (×6): 2 via ORAL
  Administered 2012-03-17: 1 via ORAL
  Administered 2012-03-17: 2 via ORAL
  Administered 2012-03-17: 1 via ORAL
  Filled 2012-03-15: qty 2
  Filled 2012-03-15: qty 1
  Filled 2012-03-15 (×5): qty 2
  Filled 2012-03-15: qty 1
  Filled 2012-03-15: qty 2

## 2012-03-15 MED ORDER — ACETAMINOPHEN 325 MG PO TABS
650.0000 mg | ORAL_TABLET | ORAL | Status: DC | PRN
  Administered 2012-03-18 – 2012-03-25 (×4): 650 mg via ORAL
  Filled 2012-03-15 (×4): qty 2

## 2012-03-15 NOTE — Progress Notes (Signed)
Patient ID: [name], female   DOB: 1925/08/25, 79 y.o.   MRN: [id number]  General Surgery - [facility] - Progress Note  POD# 3  Subjective: Patient in bed.  Daughter at bedside.  Somnolent.  Getting up to void.  Doesn't like PCA pump.  Wants to eat.  Objective: Vital signs in last 24 hours: Temp:  [97.5 F (36.4 C)-98.4 F (36.9 C)] 97.5 F (36.4 C) (03/15 0643) Pulse Rate:  [63-93] 81 (03/15 0643) Resp:  [12-24] 14 (03/15 0849) BP: (136-165)/(56-93) 157/68 mmHg (03/15 0643) SpO2:  [92 %-97 %] 96 % (03/15 0849) FiO2 (%):  [94 %] 94 % (03/14 2000) Weight:  [144 lb 14.4 oz (65.726 kg)] 144 lb 14.4 oz (65.726 kg) (03/14 1000) Last BM Date: 03/12/12  Intake/Output from previous day: 03/14 0701 - 03/15 0700 In: 1281.3 [I.V.:1231.3; IV Piggyback:50] Out: 602 [Urine:602]  Exam: HEENT - clear, not icteric Neck - soft Chest - clear bilaterally Cor - RRR, no murmur Abd - soft, mild distension; BS present; no output from stoma; midline dressing intact Ext - no significant edema Neuro - grossly intact, no focal deficits  Lab Results:   Recent Labs  03/13/12 0655 03/15/12 0642  WBC 13.9* 9.1  HGB 12.5 11.0*  HCT 37.6 32.2*  PLT 296 304     Recent Labs  03/13/12 0655 03/14/12 0500 03/15/12 0642  NA 141  --  145  K 4.3  --  4.0  CL 107  --  111  CO2 25  --  28  GLUCOSE 145*  --  128*  BUN 12  --  14  CREATININE 0.96 0.83 0.62  CALCIUM 8.4  --  8.8    Studies/Results: No results found.  Assessment / Plan: 1.  Status post Hartmann's resection for diverticular disease  Continue IV Invanz  Begin clear liquid diet  OOB, ambulate  Discontinue PCA - begin po pain Rx  Will likely need Rehab consult next week  Velora Heckler, MD, [facility] Office: [phone]  03/15/2012

## 2012-03-15 NOTE — Progress Notes (Signed)
Patient ID: [name]  female  ZOX:[id number]    DOB: 24-Dec-1925    DOA: 03/07/2012  PCP: Kaleen Mask, MD  Assessment/Plan:  Recurrent acute sigmoid diverticulitis with intra-abdominal abscess: with post op ileus 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  - Management per ccs - Started on oral narcotics, dc'ed PCA - clear liquid diet today by CCS    Hypertension: Somewhat elevated, increase lisinopril to 20 mg every   History of Chronic diastolic CHF (congestive heart failure): Currently stable and compensated - Compensated, EF 55-60% based on 2D Echo on 02/25/2009 - On low-dose Lasix    H/O: hypothyroidism - Stable, last TSH 1/14 normal, the patient is not on any replacement  History of COPD currently not wheezing and stable:   DVT Prophylaxis: lovenox  Code Status:  Disposition: PT/OT -> home health PT    Subjective: Daughter at the bedside, wants to eat today  Objective: Weight change:   Intake/Output Summary (Last 24 hours) at 03/15/12 1242 Last data filed at 03/14/12 2300  Gross per 24 hour  Intake 1281.25 ml  Output    302 ml  Net 979.25 ml   Blood pressure 149/73, pulse 89, temperature 98.1 F (36.7 C), temperature source Oral, resp. rate 16, height 5\' 1"  (1.549 m), weight 65.726 kg (144 lb 14.4 oz), SpO2 97.00%.  Physical Exam: General: AXO3 CVS: S1-S2 clear Chest: CTAB Abdomen:  Dressing intact, colostomy+ Extremities: no c/c/e   Lab Results: Basic Metabolic Panel:  Recent Labs Lab 03/12/12 0641  03/13/12 0655 03/14/12 0500 03/15/12 0642  NA 139  < > 141  --  145  K 4.0  < > 4.3  --  4.0  CL 105  --  107  --  111  CO2 26  --  25  --  28  GLUCOSE 98  < > 145*  --  128*  BUN 8  --  12  --  14  CREATININE 0.63  < > 0.96 0.83 0.62  CALCIUM 8.6  --  8.4  --  8.8  MG 2.1  --   --   --   --   < > = values in this interval not displayed. Liver Function Tests: No results found for this basename: AST, ALT,  ALKPHOS, BILITOT, PROT, ALBUMIN,  in the last 168 hours CBC:  Recent Labs Lab 03/13/12 0655 03/15/12 0642  WBC 13.9* 9.1  HGB 12.5 11.0*  HCT 37.6 32.2*  MCV 87.0 92.8  PLT 296 304   Cardiac Enzymes: No results found for this basename: CKTOTAL, CKMB, CKMBINDEX, TROPONINI,  in the last 168 hours BNP: No components found with this basename: POCBNP,  CBG: No results found for this basename: GLUCAP,  in the last 168 hours   Micro Results: Recent Results (from the past 240 hour(s))  SURGICAL PCR SCREEN     Status: None   Collection Time    03/11/12  9:28 PM      Result Value Range Status   MRSA, PCR NEGATIVE  NEGATIVE Final   Staphylococcus aureus NEGATIVE  NEGATIVE Final   Comment:            The Xpert SA Assay (FDA     approved for NASAL specimens     in patients over 63 years of age),     is one component of     a comprehensive surveillance     program.  Test performance has  been validated by [facility] for patients greater     than or equal to 75 year old.     It is not intended     to diagnose infection nor to     guide or monitor treatment.    Studies/Results: Ct Abdomen Pelvis W Contrast  03/07/2012  *RADIOLOGY REPORT*  Clinical Data: Lower abdominal pain, history diverticulitis, CHF, COPD  CT ABDOMEN AND PELVIS WITH CONTRAST  Technique:  Multidetector CT imaging of the abdomen and pelvis was performed following the standard protocol during bolus administration of intravenous contrast. Sagittal and coronal MPR images reconstructed from axial data set.  Contrast: 80mL OMNIPAQUE IOHEXOL 300 MG/ML  SOLN Dilute oral contrast.  Comparison: 02/07/2012  Findings: Minimal atelectasis posterior right lower lobe base. Improved atelectasis left lower lobe. Mitral annular calcification noted. Bilateral renal cysts. Liver, spleen, pancreas, with kidneys, and adrenal glands otherwise normal appearance.  Stomach incompletely distended, suboptimal assessment wall thickness.  Small umbilical hernia containing fat. Diverticulosis of descending and sigmoid colon. Wall thickening identified at mid sigmoid colon with improved pericolic inflammatory changes compatible with acute diverticulitis. Extraluminal gas collections medial to the thickened sigmoid segment again seen compatible with contained perforation within the mesocolon. The presence of minimal high attenuation within this collection raises the possibility that this collection demonstrates continued communication with the adjacent sigmoid colon lumen.  No discrete drainable fluid/abscess collection identified. No free intraperitoneal air. Small bowel loops and remainder colon showed no additional acute changes. Scattered atherosclerotic calcifications. No mass, adenopathy, free to free of fluid, or hernia. Bones diffusely demineralized.  IMPRESSION: Persistent acute diverticulitis changes of the sigmoid colon with wall thickening and further improved pericolic infiltrative changes. Persistent visualization of extraluminal gas collections within the sigmoid mesocolon at site of prior diverticular abscess, containing a small amount of central high attenuation which could represent GI contrast and indicate continued communication of this collection with the sigmoid lumen. Diverticulosis of sigmoid and descending colon.   Original Report Authenticated By: Ulyses Southward, M.D.     Medications: Scheduled Meds: . antiseptic oral rinse  15 mL Mouth Rinse q12n4p  . chlorhexidine  15 mL Mouth Rinse BID  . enoxaparin (LOVENOX) injection  40 mg Subcutaneous Q24H  . ertapenem  1 g Intravenous Q24H  . feeding supplement  30 mL Oral QAC breakfast  . feeding supplement  1 Container Oral BID BM  . furosemide  5 mg Oral Daily  . lisinopril  10 mg Oral Daily      LOS: 8 days   Babita Amaker M.D. [facility] 03/15/2012, 12:42 PM Pager: [phone]  If 7PM-7AM, please contact night-coverage [email or web address] Password  [email or web address]

## 2012-03-16 LAB — BASIC METABOLIC PANEL
BUN: 14 mg/dL (ref 6–23)
Calcium: 8.9 mg/dL (ref 8.4–10.5)
Creatinine, Ser: 0.6 mg/dL (ref 0.50–1.10)
GFR calc Af Amer: >90 mL/min (ref >90)
GFR calc non Af Amer: 81 mL/min — ABNORMAL LOW (ref >90)
Glucose, Bld: 129 mg/dL — ABNORMAL HIGH (ref 70–99)
Potassium: 3.4 mEq/L — ABNORMAL LOW (ref 3.5–5.1)

## 2012-03-16 LAB — TYPE AND SCREEN

## 2012-03-16 LAB — CBC
Hemoglobin: 12.6 g/dL (ref 12.0–15.0)
MCH: 29.9 pg (ref 26.0–34.0)
MCHC: 33.4 g/dL (ref 30.0–36.0)
Platelets: 370 10*3/uL (ref 150–400)
RDW: 13.6 % (ref 11.5–15.5)

## 2012-03-16 NOTE — Progress Notes (Signed)
Patient ID: [name], female   DOB: 1925/05/26, 79 y.o.   MRN: [id number]  General Surgery - [facility] - Progress Note  POD# 4  Subjective: Patient in bed.  Family at bedside.  Fatigues easily.  Up to bedside commode.  Voiding.  No complaints of pain.  Tolerating clear liquids.  Objective: Vital signs in last 24 hours: Temp:  [98 F (36.7 C)-98.2 F (36.8 C)] 98.2 F (36.8 C) (03/16 0645) Pulse Rate:  [65-90] 65 (03/16 0645) Resp:  [14-18] 18 (03/16 0645) BP: (136-156)/(65-73) 156/72 mmHg (03/16 0645) SpO2:  [90 %-98 %] 90 % (03/16 0645) Last BM Date: 03/12/12  Intake/Output from previous day: 03/15 0701 - 03/16 0700 In: 2807 [P.O.:360; I.V.:2447] Out: 0   Exam: HEENT - clear, not icteric Neck - soft Chest - clear bilaterally Cor - RRR, no murmur Abd - mild distension; BS present; no output at stoma; midline wound intact and dry Ext - no significant edema Neuro - grossly intact, no focal deficits  Lab Results:   Recent Labs  03/15/12 0642 03/16/12 0440  WBC 9.1 9.6  HGB 11.0* 12.6  HCT 32.2* 37.7  PLT 304 370     Recent Labs  03/15/12 0642 03/16/12 0440  NA 145 146*  K 4.0 3.4*  CL 111 108  CO2 28 27  GLUCOSE 128* 129*  BUN 14 14  CREATININE 0.62 0.60  CALCIUM 8.8 8.9    Studies/Results: No results found.  Assessment / Plan: 1. Status post Hartmann's resection for diverticular disease   Continue IV Invanz   Clear liquid diet   OOB, ambulate    Will likely need Rehab consult next week   Velora Heckler, MD, [facility] Office: [phone]  03/16/2012

## 2012-03-16 NOTE — Progress Notes (Signed)
Pt. exhausted up to [facility] @ least q30-40 min. during the night for amts. less than 60cc.Pt./daughter states lasix is not taken every day @ home, only 3x's a week

## 2012-03-16 NOTE — Progress Notes (Signed)
Patient ID: [name]  female  WGN:[id number]    DOB: 1925/04/28    DOA: 03/07/2012  PCP: Kaleen Mask, MD  Assessment/Plan:  Recurrent acute sigmoid diverticulitis with intra-abdominal abscess: POD4 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  - Management per ccs - Tolerating clears, cont IV invanz    Hypertension: cont lisinopril   History of Chronic diastolic CHF (congestive heart failure): Currently stable and compensated - Compensated, EF 55-60% based on 2D Echo on 02/25/2009 - On low-dose Lasix    H/O: hypothyroidism - Stable, last TSH 1/14 normal, the patient is not on any replacement  History of COPD currently not wheezing and stable:   DVT Prophylaxis: lovenox  Code Status:  Disposition: I reordered the physical therapy evaluation, probably will benefit from rehabilitation DC when cleared by surgery and tolerating solids.   Subjective: Patient getting frustrated with the staying in the bed. However per her daughter, she gets tired within 5 minutes on sitting up in the chair  Objective: Weight change:   Intake/Output Summary (Last 24 hours) at 03/16/12 1043 Last data filed at 03/16/12 0700  Gross per 24 hour  Intake   2807 ml  Output      0 ml  Net   2807 ml   Blood pressure 156/72, pulse 65, temperature 98.2 F (36.8 C), temperature source Oral, resp. rate 18, height 5\' 1"  (1.549 m), weight 65.726 kg (144 lb 14.4 oz), SpO2 90.00%.  Physical Exam: General: AXO3 CVS: S1-S2 clear Chest: CTAB Abdomen:  Dressing intact, colostomy+ Extremities: no c/c/e   Lab Results: Basic Metabolic Panel:  Recent Labs Lab 03/12/12 0641  03/15/12 0642 03/16/12 0440  NA 139  < > 145 146*  K 4.0  < > 4.0 3.4*  CL 105  < > 111 108  CO2 26  < > 28 27  GLUCOSE 98  < > 128* 129*  BUN 8  < > 14 14  CREATININE 0.63  < > 0.62 0.60  CALCIUM 8.6  < > 8.8 8.9  MG 2.1  --   --   --   < > = values in this interval not displayed. Liver  Function Tests: No results found for this basename: AST, ALT, ALKPHOS, BILITOT, PROT, ALBUMIN,  in the last 168 hours CBC:  Recent Labs Lab 03/15/12 0642 03/16/12 0440  WBC 9.1 9.6  HGB 11.0* 12.6  HCT 32.2* 37.7  MCV 92.8 89.5  PLT 304 370   Cardiac Enzymes: No results found for this basename: CKTOTAL, CKMB, CKMBINDEX, TROPONINI,  in the last 168 hours BNP: No components found with this basename: POCBNP,  CBG: No results found for this basename: GLUCAP,  in the last 168 hours   Micro Results: Recent Results (from the past 240 hour(s))  SURGICAL PCR SCREEN     Status: None   Collection Time    03/11/12  9:28 PM      Result Value Range Status   MRSA, PCR NEGATIVE  NEGATIVE Final   Staphylococcus aureus NEGATIVE  NEGATIVE Final   Comment:            The Xpert SA Assay (FDA     approved for NASAL specimens     in patients over 49 years of age),     is one component of     a comprehensive surveillance     program.  Test performance has     been validated by [facility] for patients greater     than or equal to 61 year old.     It is not intended     to diagnose infection nor to     guide or monitor treatment.    Studies/Results: Ct Abdomen Pelvis W Contrast  03/07/2012  *RADIOLOGY REPORT*  Clinical Data: Lower abdominal pain, history diverticulitis, CHF, COPD  CT ABDOMEN AND PELVIS WITH CONTRAST  Technique:  Multidetector CT imaging of the abdomen and pelvis was performed following the standard protocol during bolus administration of intravenous contrast. Sagittal and coronal MPR images reconstructed from axial data set.  Contrast: 80mL OMNIPAQUE IOHEXOL 300 MG/ML  SOLN Dilute oral contrast.  Comparison: 02/07/2012  Findings: Minimal atelectasis posterior right lower lobe base. Improved atelectasis left lower lobe. Mitral annular calcification noted. Bilateral renal cysts. Liver, spleen, pancreas, with kidneys, and adrenal glands otherwise normal appearance.  Stomach  incompletely distended, suboptimal assessment wall thickness. Small umbilical hernia containing fat. Diverticulosis of descending and sigmoid colon. Wall thickening identified at mid sigmoid colon with improved pericolic inflammatory changes compatible with acute diverticulitis. Extraluminal gas collections medial to the thickened sigmoid segment again seen compatible with contained perforation within the mesocolon. The presence of minimal high attenuation within this collection raises the possibility that this collection demonstrates continued communication with the adjacent sigmoid colon lumen.  No discrete drainable fluid/abscess collection identified. No free intraperitoneal air. Small bowel loops and remainder colon showed no additional acute changes. Scattered atherosclerotic calcifications. No mass, adenopathy, free to free of fluid, or hernia. Bones diffusely demineralized.  IMPRESSION: Persistent acute diverticulitis changes of the sigmoid colon with wall thickening and further improved pericolic infiltrative changes. Persistent visualization of extraluminal gas collections within the sigmoid mesocolon at site of prior diverticular abscess, containing a small amount of central high attenuation which could represent GI contrast and indicate continued communication of this collection with the sigmoid lumen. Diverticulosis of sigmoid and descending colon.   Original Report Authenticated By: Ulyses Southward, M.D.     Medications: Scheduled Meds: . antiseptic oral rinse  15 mL Mouth Rinse q12n4p  . chlorhexidine  15 mL Mouth Rinse BID  . enoxaparin (LOVENOX) injection  40 mg Subcutaneous Q24H  . ertapenem  1 g Intravenous Q24H  . feeding supplement  30 mL Oral QAC breakfast  . feeding supplement  1 Container Oral BID BM  . furosemide  5 mg Oral Daily  . lisinopril  20 mg Oral Daily      LOS: 9 days   RAI,RIPUDEEP M.D. [facility] 03/16/2012, 10:43 AM Pager: [phone]  If 7PM-7AM,  please contact night-coverage [email or web address] Password [email or web address]

## 2012-03-17 DIAGNOSIS — E871 Hypo-osmolality and hyponatremia: Secondary | ICD-10-CM

## 2012-03-17 DIAGNOSIS — K6389 Other specified diseases of intestine: Secondary | ICD-10-CM

## 2012-03-17 LAB — CBC
Hemoglobin: 12.8 g/dL (ref 12.0–15.0)
MCH: 28.4 pg (ref 26.0–34.0)
Platelets: 364 10*3/uL (ref 150–400)
RBC: 4.5 MIL/uL (ref 3.87–5.11)

## 2012-03-17 LAB — BASIC METABOLIC PANEL
Calcium: 8.7 mg/dL (ref 8.4–10.5)
GFR calc Af Amer: >90 mL/min (ref >90)
GFR calc non Af Amer: 81 mL/min — ABNORMAL LOW (ref >90)
Potassium: 3.4 mEq/L — ABNORMAL LOW (ref 3.5–5.1)
Sodium: 145 mEq/L (ref 135–145)

## 2012-03-17 LAB — GLUCOSE, CAPILLARY
Glucose-Capillary: 143 mg/dL — ABNORMAL HIGH (ref 70–99)
Glucose-Capillary: 132 mg/dL — ABNORMAL HIGH (ref 70–99)

## 2012-03-17 LAB — MAGNESIUM: Magnesium: 1.8 mg/dL (ref 1.5–2.5)

## 2012-03-17 MED ORDER — FAT EMULSION 20 % IV EMUL
120.0000 mL | INTRAVENOUS | Status: AC
  Administered 2012-03-17: 120 mL via INTRAVENOUS
  Filled 2012-03-17: qty 200

## 2012-03-17 MED ORDER — SODIUM CHLORIDE 0.9 % IV SOLN
INTRAVENOUS | Status: DC
  Administered 2012-03-21 – 2012-03-24 (×2): via INTRAVENOUS

## 2012-03-17 MED ORDER — POLYETHYLENE GLYCOL 3350 17 G PO PACK
17.0000 g | PACK | Freq: Every day | ORAL | Status: DC
  Administered 2012-03-17 – 2012-03-31 (×12): 17 g via ORAL
  Filled 2012-03-17 (×15): qty 1

## 2012-03-17 MED ORDER — POTASSIUM CHLORIDE 10 MEQ/50ML IV SOLN
10.0000 meq | INTRAVENOUS | Status: AC
  Administered 2012-03-17 (×3): 10 meq via INTRAVENOUS
  Filled 2012-03-17 (×3): qty 50

## 2012-03-17 MED ORDER — TRACE MINERALS CR-CU-F-FE-I-MN-MO-SE-ZN IV SOLN
INTRAVENOUS | Status: AC
  Administered 2012-03-17: 18:00:00 via INTRAVENOUS
  Filled 2012-03-17: qty 1000

## 2012-03-17 MED ORDER — INSULIN ASPART 100 UNIT/ML ~~LOC~~ SOLN
0.0000 [IU] | Freq: Four times a day (QID) | SUBCUTANEOUS | Status: DC
  Administered 2012-03-17 – 2012-03-18 (×5): 1 [IU] via SUBCUTANEOUS
  Administered 2012-03-19 (×2): via SUBCUTANEOUS
  Administered 2012-03-19 – 2012-03-20 (×3): 1 [IU] via SUBCUTANEOUS

## 2012-03-17 MED ORDER — DOCUSATE SODIUM 100 MG PO CAPS
100.0000 mg | ORAL_CAPSULE | Freq: Two times a day (BID) | ORAL | Status: DC | PRN
  Filled 2012-03-17 (×2): qty 1

## 2012-03-17 MED ORDER — MAGNESIUM SULFATE 40 MG/ML IJ SOLN
2.0000 g | Freq: Once | INTRAMUSCULAR | Status: AC
  Administered 2012-03-17: 2 g via INTRAVENOUS
  Filled 2012-03-17: qty 50

## 2012-03-17 MED ORDER — SODIUM CHLORIDE 0.9 % IJ SOLN
10.0000 mL | INTRAMUSCULAR | Status: DC | PRN
  Administered 2012-03-19 – 2012-03-22 (×3): 10 mL
  Filled 2012-03-17: qty 20

## 2012-03-17 NOTE — Progress Notes (Signed)
Peripherally Inserted Central Catheter/Midline Placement  The IV Nurse has discussed with the patient and/or persons authorized to consent for the patient, the purpose of this procedure and the potential benefits and risks involved with this procedure.  The benefits include less needle sticks, lab draws from the catheter and patient may be discharged home with the catheter.  Risks include, but not limited to, infection, bleeding, blood clot (thrombus formation), and puncture of an artery; nerve damage and irregular heat beat.  Alternatives to this procedure were also discussed.  PICC/Midline Placement Documentation        [name] 03/17/2012, 1:06 PM

## 2012-03-17 NOTE — Consult Note (Signed)
WOC ostomy  Follow-up consult  Stoma type/location: Left lower quad colostomy.  [name], CCS PA at bedside to assess stoma for patency since there has been no output since surgery. Refer to CCS notes for further information. Stomal assessment/size: Stoma red and viable, 11/4 inches, flush with skin level. Peristomal assessment: Skin intact surrounding stoma. Output Some flatus in pouch, no stool. Ostomy pouching: 1pc. With barrier ring Education provided: Demonstrated pouch change with one piece flexible pouch with barrier ring.  Pt watched and able to open and close velcro to empty.  No family members at bedside.  Pt will need assistance with pouching activities after discharge.  Supplies ordered to bedside for staff use. Cammie Mcgee MSN, RN, CWOCN, [place], CNS [phone]

## 2012-03-17 NOTE — Progress Notes (Signed)
Physical Therapy Treatment Patient Details Name: [name] MRN: [id number] DOB: 05-06-1925 Today's Date: 03/17/2012 Time: 4098-1191 PT Time Calculation (min): 41 min  PT Assessment / Plan / Recommendation Comments on Treatment Session  Pt very limited with functional mobility secondary to pain; fatigue and poor activity tolerance.  Pt O2 sats prior to therapy 90% on rm air (pt granddaughter states she does not recall last time pt was on oxygen while here) patient with standing desaturated in mid 80s. Pt transferred to commode. Placed on 2 liters O2 support via [place]. Patient +dizziness which resolved as O2 increased.  Patient ambulated on 3 liters with 4 standing rest breaks for desaturation. fluctuation from 89% to 94% on 3 liters with activity.  Spoke with patient and granddaughter at length regarding concerns for deep breathing and educated on use of IS and PLBing techniques.  Additionally encouraged patient to remain OOB and upright as able. Will continue to see patient as indicated. Upon re-evaluation, recommend short term SNF stay ti improve patients functional mobility and activity tolerance. Pt and granddaughter in agreement. Will update recommendations.    Follow Up Recommendations  SNF     Does the patient have the potential to tolerate intense rehabilitation     Barriers to Discharge        Equipment Recommendations  None recommended by PT    Recommendations for Other Services    Frequency Min 3X/week   Plan Discharge plan needs to be updated    Precautions / Restrictions Precautions Precautions: Fall;Other (comment) (colostomy) Precaution Comments: colostomy Restrictions Weight Bearing Restrictions: No   Pertinent Vitals/Pain 10/10 with activity 6/10 at rest    Mobility  Bed Mobility Bed Mobility: Rolling Left;Left Sidelying to Sit Rolling Left: 4: Min assist Left Sidelying to Sit: 3: Mod assist;HOB elevated Details for Bed Mobility Assistance: Pt with difficulty  carrying out VC's for positioning req multimodal cues and assist Transfers Transfers: Sit to Stand;Stand to Sit;Stand Pivot Transfers Sit to Stand: 4: Min assist;From bed;From chair/3-in-1 Stand to Sit: 4: Min assist;With upper extremity assist;To chair/3-in-1 Stand to Sit: Patient Percentage: 60% Stand Pivot Transfers: 4: Min assist Details for Transfer Assistance: Max VCs for hand placement and positioning; assist for RW stabilization and Mx cues to carryout task; O2 desaturation 85% on rm air Ambulation/Gait Ambulation/Gait Assistance: 4: Min guard;4: Min assist (NT due to pain level) Ambulation Distance (Feet): 50 Feet Assistive device: Rolling walker Ambulation/Gait Assistance Details: Max cues for position with RW; Cues for breathing; Pt ambulated on 3 L O2; continuous monitoring  Gait Pattern: Step-through pattern;Decreased stride length;Trunk flexed Gait velocity: decreased Stairs: No     PT Goals Acute Rehab PT Goals PT Goal Formulation: With patient Time For Goal Achievement: 03/15/12 Potential to Achieve Goals: Good Pt will go Supine/Side to Sit: Independently;with HOB 0 degrees PT Goal: Supine/Side to Sit - Progress: Progressing toward goal Pt will go Sit to Supine/Side: Independently;with HOB 0 degrees PT Goal: Sit to Supine/Side - Progress: Progressing toward goal Pt will go Sit to Stand: with modified independence;with upper extremity assist PT Goal: Sit to Stand - Progress: Progressing toward goal Pt will Ambulate: >150 feet;with modified independence;with rolling walker PT Goal: Ambulate - Progress: Progressing toward goal Pt will Go Up / Down Stairs: 1-2 stairs;with supervision;with least restrictive assistive device  Visit Information  Last PT Received On: 03/17/12 Assistance Needed: +1    Subjective Data  Subjective: I am in a lot of pain Patient Stated Goal: To return home.  Cognition  Cognition Overall Cognitive Status: Appears within functional  limits for tasks assessed/performed Arousal/Alertness: Awake/alert Orientation Level: Appears intact for tasks assessed Behavior During Session: [facility] for tasks performed       End of Session PT - End of Session Equipment Utilized During Treatment: Gait belt;Oxygen Activity Tolerance: Patient limited by fatigue;Patient limited by pain;Other (comment) Patient left: in chair;with call bell/phone within reach;with family/visitor present (O2 levels) Nurse Communication: Mobility status;Other (comment) (O2 concerns and desaturation)   GP     [name] 03/17/2012, 12:08 PM [name], PT DPT  [phone]

## 2012-03-17 NOTE — Progress Notes (Signed)
NUTRITION FOLLOW UP  Intervention:   1.  Modify diet; per MD discretion to Regular goal. 2.  Parenteral nutrition; per PharmD.   Nutrition Dx:   Inadequate oral intake, ongoing  Monitor:   1. Food/Beverage; diet advancement with tolerance  2. Wt/wt change; monitor trends  Assessment:   Pt admitted with abdominal pain, found to have recurrent flare-up of diverticulitis with small prediverticular abscess which is recurring.  Pt is now POD 5. Now with post-op ileus, awaiting return of bowel function.  Pt unavailable at time of visit- sterile procedure in progress.  Per MD note, pt c/o distention and inability to pass gas.   Pt to start TPN today.  Height: Ht Readings from Last 1 Encounters:  03/14/12 5\' 1"  (1.549 m)    Weight Status:   Wt Readings from Last 1 Encounters:  03/14/12 144 lb 14.4 oz (65.726 kg)    Re-estimated needs:  Kcal: 1640-1820 Protein: 75-90g Fluid: >1.8 L/day  Skin: wnl  Diet Order: NPO   Intake/Output Summary (Last 24 hours) at 03/17/12 1221 Last data filed at 03/17/12 1478  Gross per 24 hour  Intake   1955 ml  Output     15 ml  Net   1940 ml    Last BM: 3/12   Labs:   Recent Labs Lab 03/12/12 0641  03/15/12 0642 03/16/12 0440 03/17/12 0705  NA 139  < > 145 146* 145  K 4.0  < > 4.0 3.4* 3.4*  CL 105  < > 111 108 111  CO2 26  < > 28 27 28   BUN 8  < > 14 14 15   CREATININE 0.63  < > 0.62 0.60 0.61  CALCIUM 8.6  < > 8.8 8.9 8.7  MG 2.1  --   --   --   --   GLUCOSE 98  < > 128* 129* 131*  < > = values in this interval not displayed.  CBG (last 3)  No results found for this basename: GLUCAP,  in the last 72 hours  Scheduled Meds: . antiseptic oral rinse  15 mL Mouth Rinse q12n4p  . chlorhexidine  15 mL Mouth Rinse BID  . enoxaparin (LOVENOX) injection  40 mg Subcutaneous Q24H  . ertapenem  1 g Intravenous Q24H  . feeding supplement  30 mL Oral QAC breakfast  . feeding supplement  1 Container Oral BID BM  . furosemide  5 mg  Oral Daily  . lisinopril  20 mg Oral Daily  . polyethylene glycol  17 g Oral Daily    Continuous Infusions: . dextrose 5 % and 0.9% NaCl 75 mL/hr at 03/17/12 0607    Loyce Dys, MS RD LDN Clinical Inpatient Dietitian Pager: [phone] Weekend/After hours pager: [phone]

## 2012-03-17 NOTE — Progress Notes (Signed)
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  - Management per ccs - Tolerating clears, cont IV invanz - TPN to be started per pharmacy (per surgery recommendations)    Hypertension: cont lisinopril   History of Chronic diastolic CHF (congestive heart failure): Currently stable and compensated - Compensated, EF 55-60% based on 2D Echo on 02/25/2009 - On low-dose Lasix    H/O: hypothyroidism - Stable, last TSH 1/14 normal, the patient is not on any replacement  History of COPD currently not wheezing and stable:   DVT Prophylaxis: lovenox  Code Status:  Disposition:   Subjective: Caregiver at bedside, patient complaining of abdominal distention, nausea, poor appetite  Objective: Weight change:   Intake/Output Summary (Last 24 hours) at 03/17/12 1503 Last data filed at 03/17/12 0607  Gross per 24 hour  Intake   1115 ml  Output     15 ml  Net   1100 ml   Blood pressure 154/75, pulse 78, temperature 97.9 F (36.6 C), temperature source Oral, resp. rate 18, height 5\' 1"  (1.549 m), weight 65.726 kg (144 lb 14.4 oz), SpO2 91.00%.  Physical Exam: General: Alert and awake  CVS: S1-S2 clear Chest: CTAB Abdomen:  Dressing intact, colostomy+, BS+ Extremities: no c/c/e   Lab Results: Basic Metabolic Panel:  Recent Labs Lab 03/16/12 0440 03/17/12 0705  NA 146* 145  K 3.4* 3.4*  CL 108 111  CO2 27 28  GLUCOSE 129* 131*  BUN 14 15  CREATININE 0.60 0.61  CALCIUM 8.9 8.7  MG  --  1.8   Liver Function Tests: No results found for this basename: AST, ALT, ALKPHOS, BILITOT, PROT, ALBUMIN,  in the last 168 hours CBC:  Recent Labs Lab 03/16/12 0440 03/17/12 0705  WBC 9.6 7.9  HGB 12.6 12.8  HCT 37.7 38.9  MCV 89.5  86.4  PLT 370 364   Cardiac Enzymes: No results found for this basename: CKTOTAL, CKMB, CKMBINDEX, TROPONINI,  in the last 168 hours BNP: No components found with this basename: POCBNP,  CBG: No results found for this basename: GLUCAP,  in the last 168 hours   Micro Results: Recent Results (from the past 240 hour(s))  SURGICAL PCR SCREEN     Status: None   Collection Time    03/11/12  9:28 PM      Result Value Range Status   MRSA, PCR NEGATIVE  NEGATIVE Final   Staphylococcus aureus NEGATIVE  NEGATIVE Final   Comment:            The Xpert SA Assay (FDA     approved for NASAL specimens     in patients over 77 years of age),     is one component of     a comprehensive surveillance     program.  Test performance has     been validated by [employer] for patients greater     than or equal to 25 year old.     It is not intended     to diagnose infection nor to     guide or monitor treatment.    Studies/Results: Ct Abdomen Pelvis W Contrast  03/07/2012  *RADIOLOGY REPORT*  Clinical Data: Lower abdominal pain, history diverticulitis, CHF, COPD  CT ABDOMEN  AND PELVIS WITH CONTRAST  Technique:  Multidetector CT imaging of the abdomen and pelvis was performed following the standard protocol during bolus administration of intravenous contrast. Sagittal and coronal MPR images reconstructed from axial data set.  Contrast: 80mL OMNIPAQUE IOHEXOL 300 MG/ML  SOLN Dilute oral contrast.  Comparison: 02/07/2012  Findings: Minimal atelectasis posterior right lower lobe base. Improved atelectasis left lower lobe. Mitral annular calcification noted. Bilateral renal cysts. Liver, spleen, pancreas, with kidneys, and adrenal glands otherwise normal appearance.  Stomach incompletely distended, suboptimal assessment wall thickness. Small umbilical hernia containing fat. Diverticulosis of descending and sigmoid colon. Wall thickening identified at mid sigmoid colon with improved pericolic inflammatory  changes compatible with acute diverticulitis. Extraluminal gas collections medial to the thickened sigmoid segment again seen compatible with contained perforation within the mesocolon. The presence of minimal high attenuation within this collection raises the possibility that this collection demonstrates continued communication with the adjacent sigmoid colon lumen.  No discrete drainable fluid/abscess collection identified. No free intraperitoneal air. Small bowel loops and remainder colon showed no additional acute changes. Scattered atherosclerotic calcifications. No mass, adenopathy, free to free of fluid, or hernia. Bones diffusely demineralized.  IMPRESSION: Persistent acute diverticulitis changes of the sigmoid colon with wall thickening and further improved pericolic infiltrative changes. Persistent visualization of extraluminal gas collections within the sigmoid mesocolon at site of prior diverticular abscess, containing a small amount of central high attenuation which could represent GI contrast and indicate continued communication of this collection with the sigmoid lumen. Diverticulosis of sigmoid and descending colon.   Original Report Authenticated By: Ulyses Southward, M.D.     Medications: Scheduled Meds: . antiseptic oral rinse  15 mL Mouth Rinse q12n4p  . chlorhexidine  15 mL Mouth Rinse BID  . enoxaparin (LOVENOX) injection  40 mg Subcutaneous Q24H  . ertapenem  1 g Intravenous Q24H  . feeding supplement  30 mL Oral QAC breakfast  . feeding supplement  1 Container Oral BID BM  . furosemide  5 mg Oral Daily  . insulin aspart  0-9 Units Subcutaneous Q6H  . lisinopril  20 mg Oral Daily  . magnesium sulfate 1 - 4 g bolus IVPB  2 g Intravenous Once  . polyethylene glycol  17 g Oral Daily  . potassium chloride  10 mEq Intravenous Q1 Hr x 3      LOS: 10 days   RAI,RIPUDEEP M.D. [facility] 03/17/2012, 3:03 PM Pager: [phone]  If 7PM-7AM, please contact  night-coverage [email or web address] Password [email or web address]

## 2012-03-17 NOTE — Progress Notes (Addendum)
PARENTERAL NUTRITION CONSULT NOTE - INITIAL  Pharmacy Consult for TPN Indication: Prolonged ileus  Allergies  Allergen Reactions  . Indomethacin Anaphylaxis and Other (See Comments)    "took it fine for awhile; one day I stopped breathing and I ended up in the hospital" (01/02/2012)    Patient Measurements: Height: 5\' 1"  (154.9 cm) Weight: 144 lb 14.4 oz (65.726 kg) IBW/kg (Calculated) : 47.8 Adjusted Body Weight: 53.2 Usual Weight: 65.7  Vital Signs: Temp: 97.9 F (36.6 C) (03/17 0613) Temp src: Oral (03/17 0613) BP: 154/75 mmHg (03/17 0613) Pulse Rate: 78 (03/17 0613) Intake/Output from previous day: 03/16 0701 - 03/17 0700 In: 1955 [P.O.:240; I.V.:1715] Out: 30 [Stool:30] Intake/Output from this shift:    Labs:  Recent Labs  03/15/12 0642 03/16/12 0440 03/17/12 0705  WBC 9.1 9.6 7.9  HGB 11.0* 12.6 12.8  HCT 32.2* 37.7 38.9  PLT 304 370 364     Recent Labs  03/15/12 0642 03/16/12 0440 03/17/12 0705  NA 145 146* 145  K 4.0 3.4* 3.4*  CL 111 108 111  CO2 28 27 28   GLUCOSE 128* 129* 131*  BUN 14 14 15   CREATININE 0.62 0.60 0.61  CALCIUM 8.8 8.9 8.7   Estimated Creatinine Clearance: 45.5 ml/min (by C-G formula based on Cr of 0.61).   No results found for this basename: GLUCAP,  in the last 72 hours  Medical History: Past Medical History  Diagnosis Date  . Hypertension   . Goiter     radioactive iodine ablation/notes 01/02/2012  . COPD (chronic obstructive pulmonary disease)   . CHF (congestive heart failure)   . Pneumonia ?01/02/2012; ? 2009  . SOB (shortness of breath)     'all the time" (01/02/2012)  . Hypothyroidism     "I have taken Synthroid before" (01/02/2012)  . Arthritis     "right shoulder" (01/02/2012)  . Diverticulitis     Medications:  Scheduled:  . antiseptic oral rinse  15 mL Mouth Rinse q12n4p  . chlorhexidine  15 mL Mouth Rinse BID  . enoxaparin (LOVENOX) injection  40 mg Subcutaneous Q24H  . ertapenem  1 g Intravenous Q24H  .  feeding supplement  30 mL Oral QAC breakfast  . feeding supplement  1 Container Oral BID BM  . furosemide  5 mg Oral Daily  . lisinopril  20 mg Oral Daily  . polyethylene glycol  17 g Oral Daily    Insulin Requirements in the past 24 hours:  No hx of DM, on no insulin  Current Nutrition:  NPO  Assessment: Admitted 3/7 with abdominal pain x 1 week.  She has lost 30lb in last 15 months due to abdominal pain.  Now s/p sigmoid resection, Hartman's pouch, & end colostomy for smoldering diverticulitis; prolonged ileus.  GI:  POD#5, prolonged ileus.  (+)nausea, abd distention & diffuse tenderness.  (-)flatus, hunger.  Endo:  No hx DM, (+)hx goiter, radioiodine tx , on no thyroid replacement pta.  Lytes:  Na 145, K 3.4, Mg 1.7.  Goal K > 4 and Mg > 2 with ileus.  Currently on D5NS at 68ml/hr.  Renal:  Cr 0.61, uop tallied x 7 yesterday  Pulm:  Hx COPD, not wheezing.  RA  Cards:  Hx HTN, CHF.  154/75, 78.  Lisinopril, Lasix 5mg  daily  Hepatobil:   LFTs wnl on 3/7  Neuro: No major issues.  Vicodin for pain control.  ID:   AFeb, WBC wnl.  Invanz #7:  Diverticulitis & abscess.  Best  Practices:  Lovenox VTE px TPN Access:  PICC being placed right now TPN day#:  1  Nutritional Goals:  1640-1820 kCal, 75-90 grams of protein per day  Plan:  1.  Clinimix 5/15-E at 61ml/hr, with plan to advance to goal rate 5ml/hr to meet 100% protein needs and ~90% kcal 2.  TPN labs in AM 3.  Decrease IVF to [facility] when tpn hung & change to NS 4.  K-runs x 3 and Mg 2gm x 1 5.  Sensitive SSI q6, to start when TPN hung  Marisue Humble, PharmD Clinical Pharmacist [facility]

## 2012-03-17 NOTE — Progress Notes (Signed)
5 Days Post-Op  Subjective: Pt c/o distension and unable to pass much gas.  Pt ambulating some.  Family (daughter?) at bedside ambulating the patient without assistance, instructed to only allow this if PT/OT clears her to ambulate her due to high risk for falling.  Pt still a bit nauseated and not very hungry.    Objective: Vital signs in last 24 hours: Temp:  [97.2 F (36.2 C)-97.9 F (36.6 C)] 97.9 F (36.6 C) (03/17 1610) Pulse Rate:  [76-81] 78 (03/17 0613) Resp:  [18] 18 (03/17 0613) BP: (135-154)/(63-75) 154/75 mmHg (03/17 0613) SpO2:  [90 %-91 %] 91 % (03/17 0613) Last BM Date:  (prior to surgery. has colostomy with smalll amount of draina)  Intake/Output from previous day: 03/16 0701 - 03/17 0700 In: 1953 [P.O.:240; I.V.:1715] Out: 30 [Stool:30] Intake/Output this shift:    PE: Gen:  Alert, NAD, pleasant Abd: Soft, mildly distended, diffusely tender, few BS, no HSM, incisions C/D/I, drain with minimal sanguinous drainage    Lab Results:   Recent Labs  03/16/12 0440 03/17/12 0705  WBC 9.6 7.9  HGB 12.6 12.8  HCT 37.7 38.9  PLT 370 364   BMET  Recent Labs  03/16/12 0440 03/17/12 0705  NA 146* 145  K 3.4* 3.4*  CL 108 111  CO2 27 28  GLUCOSE 129* 131*  BUN 14 15  CREATININE 0.60 0.61  CALCIUM 8.9 8.7   PT/INR No results found for this basename: LABPROT, INR,  in the last 72 hours CMP     Component Value Date/Time   NA 145 03/17/2012 0705   K 3.4* 03/17/2012 0705   CL 111 03/17/2012 0705   CO2 28 03/17/2012 0705   GLUCOSE 131* 03/17/2012 0705   BUN 15 03/17/2012 0705   CREATININE 0.61 03/17/2012 0705   CREATININE 0.70 01/14/2012 1330   CALCIUM 8.7 03/17/2012 0705   PROT 7.0 03/07/2012 1319   ALBUMIN 3.3* 03/07/2012 1319   AST 14 03/07/2012 1319   ALT 10 03/07/2012 1319   ALKPHOS 74 03/07/2012 1319   BILITOT 0.9 03/07/2012 1319   GFRNONAA 81* 03/17/2012 0705   GFRAA >90 03/17/2012 0705   Lipase  No results found for this basename: lipase        Studies/Results: No results found.  Anti-infectives: Anti-infectives   Start     Dose/Rate Route Frequency Ordered Stop   03/08/12 1900  ertapenem (INVANZ) 1 g in sodium chloride 0.9 % 50 mL IVPB     1 g 100 mL/hr over 30 Minutes Intravenous Every 24 hours 03/08/12 1408     03/07/12 1800  ertapenem (INVANZ) 1 g in sodium chloride 0.9 % 50 mL IVPB     1 g 100 mL/hr over 30 Minutes Intravenous To Emergency Dept 03/07/12 1721 03/07/12 1955   03/07/12 1730  ertapenem (INVANZ) 1 g in sodium chloride 0.9 % 50 mL IVPB  Status:  Discontinued     1 g 100 mL/hr over 30 Minutes Intravenous  Once 03/07/12 1716 03/07/12 1717       Assessment/Plan 1. Recurrent diverticulitis with perforation prior hospitalizations 01/14/12, and 02/07/12 for the same problem.   *S/p sigmoid resection with Hartman's pouch and end colostomy - Post-operative ileus   *Tolerating sips only  *Cont PT/OT, cont ambulat OOB  *Distended, NPO except sips/ice, start TPN, PICC  *Will likely need SNF placement at some point 2. COPD/remote history of tobacco use/chronic wheezing.  3. History of congestive heart failure/hospitalized 6 or more years ago for  this.  4. Chronic shortness of breath since January 2014.  5. History of goiter/radioiodine therapy/not currently on Thyroid replacement  6. Hospitalized for pneumonia in January 2014.  7. Arthritis      LOS: 10 days    Aris Georgia 03/17/2012, 8:41 AM Pager: [phone]   She is distended and not much out from ostomy.  Change dressings and she likely has postop ileus.  Given prolonged NPO, I would consider TPN until the bowel shows return of function.

## 2012-03-18 LAB — PHOSPHORUS: Phosphorus: 3.5 mg/dL (ref 2.3–4.6)

## 2012-03-18 LAB — CBC
MCH: 28.9 pg (ref 26.0–34.0)
MCHC: 32.8 g/dL (ref 30.0–36.0)
Platelets: 391 10*3/uL (ref 150–400)
RBC: 4.29 MIL/uL (ref 3.87–5.11)
RDW: 13.9 % (ref 11.5–15.5)

## 2012-03-18 LAB — DIFFERENTIAL
Basophils Absolute: 0 10*3/uL (ref 0.0–0.1)
Basophils Relative: 0 % (ref 0–1)
Eosinophils Absolute: 0.1 10*3/uL (ref 0.0–0.7)
Lymphs Abs: 0.6 10*3/uL — ABNORMAL LOW (ref 0.7–4.0)
Neutrophils Relative %: 85 % — ABNORMAL HIGH (ref 43–77)

## 2012-03-18 LAB — MAGNESIUM: Magnesium: 2.2 mg/dL (ref 1.5–2.5)

## 2012-03-18 LAB — COMPREHENSIVE METABOLIC PANEL
ALT: 8 U/L (ref 0–35)
AST: 10 U/L (ref 0–37)
Albumin: 2.3 g/dL — ABNORMAL LOW (ref 3.5–5.2)
Alkaline Phosphatase: 54 U/L (ref 39–117)
CO2: 29 mEq/L (ref 19–32)
Chloride: 111 mEq/L (ref 96–112)
Creatinine, Ser: 0.51 mg/dL (ref 0.50–1.10)
GFR calc non Af Amer: 86 mL/min — ABNORMAL LOW (ref >90)
Potassium: 3.6 mEq/L (ref 3.5–5.1)
Total Bilirubin: 0.3 mg/dL (ref 0.3–1.2)

## 2012-03-18 LAB — CHOLESTEROL, TOTAL: Cholesterol: 94 mg/dL (ref 0–200)

## 2012-03-18 MED ORDER — CLINIMIX E/DEXTROSE (5/15) 5 % IV SOLN
INTRAVENOUS | Status: DC
  Filled 2012-03-18: qty 2000

## 2012-03-18 MED ORDER — CLINIMIX E/DEXTROSE (5/20) 5 % IV SOLN
INTRAVENOUS | Status: AC
  Administered 2012-03-18: 18:00:00 via INTRAVENOUS
  Filled 2012-03-18: qty 2000

## 2012-03-18 MED ORDER — TRAMADOL HCL 50 MG PO TABS
50.0000 mg | ORAL_TABLET | Freq: Four times a day (QID) | ORAL | Status: DC | PRN
  Administered 2012-03-18 – 2012-03-20 (×4): 50 mg via ORAL
  Filled 2012-03-18 (×5): qty 1

## 2012-03-18 MED ORDER — POTASSIUM CHLORIDE 10 MEQ/50ML IV SOLN
10.0000 meq | INTRAVENOUS | Status: AC
  Administered 2012-03-18 (×3): 10 meq via INTRAVENOUS
  Filled 2012-03-18 (×3): qty 50

## 2012-03-18 NOTE — Clinical Social Work Placement (Addendum)
    Clinical Social Work Department CLINICAL SOCIAL WORK PLACEMENT NOTE 04/01/2012  Patient:  [name], [name]  Account Number:  [id number] Admit date:  03/07/2012  Clinical Social Worker:  Rozetta Nunnery, Theresia Majors  Date/time:  03/18/2012 01:46 PM  Clinical Social Work is seeking post-discharge placement for this patient at the following level of care:   SKILLED NURSING   (*CSW will update this form in Epic as items are completed)   03/18/2012  Patient/family provided with [facility] Department of Clinical Social Work's list of facilities offering this level of care within the geographic area requested by the patient (or if unable, by the patient's family).  03/18/2012  Patient/family informed of their freedom to choose among providers that offer the needed level of care, that participate in Medicare, Medicaid or managed care program needed by the patient, have an available bed and are willing to accept the patient.  03/18/2012  Patient/family informed of [facility]' ownership interest in [facility], as well as of the fact that they are under no obligation to receive care at this facility.  PASARR submitted to EDS on 03/18/2012 PASARR number received from EDS on 03/18/2012  FL2 transmitted to all facilities in geographic area requested by pt/family on  03/18/2012 FL2 transmitted to all facilities within larger geographic area on   Patient informed that his/her managed care company has contracts with or will negotiate with  certain facilities, including the following:   [facility]- Medicare Complete     Patient/family informed of bed offers received:  03/28/2012 Patient chooses bed at [facility], [place] Physician recommends and patient chooses bed at    [facility] [place] on  03/31/2012 Patient to be transferred to facility by ambulance  Sharin Mons)  The following physician request were entered in  Epic:   Additional Comments: 03/31/12  [place] and family are pleased with d/c plan. Notified SNF and patient's nurse of d/c.  No further CSW needs identified. CSW signing off.  Lorri Frederick. [place] [phone]

## 2012-03-18 NOTE — Progress Notes (Addendum)
PARENTERAL NUTRITION CONSULT NOTE  Pharmacy Consult for TPN Indication: Prolonged ileus  Allergies  Allergen Reactions  . Indomethacin Anaphylaxis and Other (See Comments)    "took it fine for awhile; one day I stopped breathing and I ended up in the hospital" (01/02/2012)    Patient Measurements: Height: 5\' 1"  (154.9 cm) Weight: 144 lb 14.4 oz (65.726 kg) IBW/kg (Calculated) : 47.8 Adjusted Body Weight: 53.2 Usual Weight: 65.7  Vital Signs: Temp: 98 F (36.7 C) (03/18 0615) Temp src: Oral (03/18 0615) BP: 147/76 mmHg (03/18 0615) Pulse Rate: 72 (03/18 0615) Intake/Output from previous day:   Intake/Output from this shift:    Labs:  Recent Labs  03/16/12 0440 03/17/12 0705 03/18/12 0557  WBC 9.6 7.9 10.9*  HGB 12.6 12.8 12.4  HCT 37.7 38.9 37.8  PLT 370 364 391     Recent Labs  03/16/12 0440 03/17/12 0705 03/18/12 0425 03/18/12 0557  NA 146* 145  --  146*  K 3.4* 3.4*  --  3.6  CL 108 111  --  111  CO2 27 28  --  29  GLUCOSE 129* 131*  --  154*  BUN 14 15  --  17  CREATININE 0.60 0.61  --  0.51  CALCIUM 8.9 8.7  --  8.5  MG  --  1.8  --  2.2  PHOS  --   --   --  3.5  PROT  --   --   --  5.3*  ALBUMIN  --   --   --  2.3*  AST  --   --   --  10  ALT  --   --   --  8  ALKPHOS  --   --   --  54  BILITOT  --   --   --  0.3  TRIG  --   --  124  --   CHOL  --   --  94  --    Estimated Creatinine Clearance: 45.5 ml/min (by C-G formula based on Cr of 0.51).    Recent Labs  03/17/12 2012 03/17/12 2357 03/18/12 0611  GLUCAP 132* 143* 146*    Medical History: Past Medical History  Diagnosis Date  . Hypertension   . Goiter     radioactive iodine ablation/notes 01/02/2012  . COPD (chronic obstructive pulmonary disease)   . CHF (congestive heart failure)   . Pneumonia ?01/02/2012; ? 2009  . SOB (shortness of breath)     'all the time" (01/02/2012)  . Hypothyroidism     "I have taken Synthroid before" (01/02/2012)  . Arthritis     "right shoulder"  (01/02/2012)  . Diverticulitis     Medications:  Scheduled:  . antiseptic oral rinse  15 mL Mouth Rinse q12n4p  . chlorhexidine  15 mL Mouth Rinse BID  . enoxaparin (LOVENOX) injection  40 mg Subcutaneous Q24H  . ertapenem  1 g Intravenous Q24H  . feeding supplement  30 mL Oral QAC breakfast  . feeding supplement  1 Container Oral BID BM  . furosemide  5 mg Oral Daily  . insulin aspart  0-9 Units Subcutaneous Q6H  . lisinopril  20 mg Oral Daily  . [COMPLETED] magnesium sulfate 1 - 4 g bolus IVPB  2 g Intravenous Once  . polyethylene glycol  17 g Oral Daily  . [COMPLETED] potassium chloride  10 mEq Intravenous Q1 Hr x 3    Insulin Requirements in the past 24 hours: 3 units;  SSI sens q6h  Current Nutrition:  NPO; Clinimix E5/15 at 40 ml/hr;  Nutritional Goals:  1640-1820 kCal, 70-80 grams of protein per day  Assessment: Admitted 3/7 with abdominal pain x 1 week.  She has lost 30lb in last 15 months due to abdominal pain.  Now s/p sigmoid resection, Hartman's pouch, & end colostomy for smoldering diverticulitis; prolonged ileus.  Tolerated initiation of TPN overnight.  GI:  POD#6, prolonged ileus.  (+)nausea, abd distention & diffuse tenderness.  (-)flatus, hunger.  Endo:  No hx DM, (+)hx goiter, radioiodine tx , on no thyroid replacement pta.  Lytes:  Na 146- consistent with previous labs, K slightly improved with K runs - up to 3.6, Mg improved with bolus, now 2.2, Phos 3.5.  Goal K > 4 and Mg > 2 with ileus.  IVF: NS at [facility].  Renal:  Cr 0.51, uop -- not well recorded yesterday  Pulm:  Hx COPD, not wheezing.  3L [place]  Cards:  Hx HTN, CHF.  154/75, 78.  Lisinopril, Lasix 5mg  daily  Hepatobil:   LFTs wnl on 3/7  Neuro: No major issues.  Vicodin for pain control.  ID:   AFeb, WBC inc to 10.9 today; Invanz 3/7>>continues:  Diverticulitis & abscess.  Best Practices:  Lovenox VTE px TPN Access:  PICC being placed right now TPN day#:  2  Plan:  - Change Clinimix to E5/20 at  65 ml/hr -- goal.  Will provide 78g protein per day and an average of ~1580 kcal/day. - Provide lipids at 10 ml/hr, MVI, TE MWF only 2/2 national backorder - Repeat K-runs x 3 - Continue NS at [facility] - Continue sensitive SSI q6 - Continue standing TPN labs, f/u am bmp, cbgs  Joevanni Roddey L. Illene Bolus, PharmD, BCPS Clinical Pharmacist Pager: [phone] Pharmacy: [phone] 03/18/2012 7:32 AM

## 2012-03-18 NOTE — Progress Notes (Signed)
6 Days Post-Op  Subjective: Pt states pain is a little better today.  No N/V, tolerating sips only.  C/o distension and mild pain.  Ambulated some with PT/OT.  Pt was a bit delirious overnight after being given dilaudid.    Objective: Vital signs in last 24 hours: Temp:  [97.6 F (36.4 C)-98 F (36.7 C)] 98 F (36.7 C) (03/18 0615) Pulse Rate:  [72-77] 72 (03/18 0615) Resp:  [17-20] 20 (03/18 0615) BP: (147-163)/(74-77) 147/76 mmHg (03/18 0615) SpO2:  [93 %-100 %] 100 % (03/18 0615) Last BM Date:  (prior to surgery. has colostomy with smalll amount of draina)    PE: Gen:  Alert, NAD, pleasant Abd: Soft, +distension, mild tenderness, +BS (improved), no HSM, incisions C/D/I, ostomy patent (digitalized yesterday), still not ostomy output other than +gas in bag.     Lab Results:   Recent Labs  03/17/12 0705 03/18/12 0557  WBC 7.9 10.9*  HGB 12.8 12.4  HCT 38.9 37.8  PLT 364 391   BMET  Recent Labs  03/17/12 0705 03/18/12 0557  NA 145 146*  K 3.4* 3.6  CL 111 111  CO2 28 29  GLUCOSE 131* 154*  BUN 15 17  CREATININE 0.61 0.51  CALCIUM 8.7 8.5   PT/INR No results found for this basename: LABPROT, INR,  in the last 72 hours CMP     Component Value Date/Time   NA 146* 03/18/2012 0557   K 3.6 03/18/2012 0557   CL 111 03/18/2012 0557   CO2 29 03/18/2012 0557   GLUCOSE 154* 03/18/2012 0557   BUN 17 03/18/2012 0557   CREATININE 0.51 03/18/2012 0557   CREATININE 0.70 01/14/2012 1330   CALCIUM 8.5 03/18/2012 0557   PROT 5.3* 03/18/2012 0557   ALBUMIN 2.3* 03/18/2012 0557   AST 10 03/18/2012 0557   ALT 8 03/18/2012 0557   ALKPHOS 54 03/18/2012 0557   BILITOT 0.3 03/18/2012 0557   GFRNONAA 86* 03/18/2012 0557   GFRAA >90 03/18/2012 0557   Lipase  No results found for this basename: lipase       Studies/Results: No results found.  Anti-infectives: Anti-infectives   Start     Dose/Rate Route Frequency Ordered Stop   03/08/12 1900  ertapenem (INVANZ) 1 g in sodium  chloride 0.9 % 50 mL IVPB     1 g 100 mL/hr over 30 Minutes Intravenous Every 24 hours 03/08/12 1408     03/07/12 1800  ertapenem (INVANZ) 1 g in sodium chloride 0.9 % 50 mL IVPB     1 g 100 mL/hr over 30 Minutes Intravenous To Emergency Dept 03/07/12 1721 03/07/12 1955   03/07/12 1730  ertapenem (INVANZ) 1 g in sodium chloride 0.9 % 50 mL IVPB  Status:  Discontinued     1 g 100 mL/hr over 30 Minutes Intravenous  Once 03/07/12 1716 03/07/12 1717       Assessment/Plan 1. Recurrent diverticulitis with perforation prior hospitalizations 01/14/12, and 02/07/12 for the same problem.  *S/p sigmoid resection with Hartman's pouch and end colostomy - Post-operative ileus  *Tolerating sips only  *Cont PT/OT, cont ambulat OOB  *Distended, NPO except sips/ice, Cont. TPN, PICC  *Will likely need SNF placement at some point   2. COPD/remote history of tobacco use/chronic wheezing O2 requirements high over night 3. History of congestive heart failure/hospitalized 6 or more years ago for this.  4. Chronic shortness of breath since January 2014.  5. History of goiter/radioiodine therapy/not currently on Thyroid replacement  6. Hospitalized  for pneumonia in January 2014.  7. Arthritis      LOS: 11 days    DORT, MEGAN 03/18/2012, 9:34 AM Pager: [phone]  Her abdomen is still distended.  Ostomy with gas but not much output otherwise.  Still awaiting return of bowel function but she seems to be doing okay otherwise.

## 2012-03-18 NOTE — Clinical Social Work Psychosocial (Signed)
     Clinical Social Work Department BRIEF PSYCHOSOCIAL ASSESSMENT 03/18/2012  Patient:  [name], [name]     Account Number:  [id number]     Admit date:  03/07/2012  Clinical Social Worker:  Hulan Fray  Date/Time:  03/18/2012 10:44 AM  Referred by:  CSW  Date Referred:  03/18/2012 Referred for  SNF Placement   Other Referral:   Interview type:  Patient Other interview type:   daughter, Lenn Sink ([phone])    PSYCHOSOCIAL DATA Living Status:  ALONE Admitted from facility:   Level of care:   Primary support name:  Lenn Sink Primary support relationship to patient:  CHILD, ADULT Degree of support available:   supportive    CURRENT CONCERNS Current Concerns  Post-Acute Placement   Other Concerns:    SOCIAL WORK ASSESSMENT / PLAN Clinical Social Worker received referral for SNF placement at discharge. CSW introduced self and explained reason for visit. Patient's daughter was at bedside. Patient and daughter are agreeable for SNF placement and prefers [facility] as this facility is close to home. CSW provided SNF packet. CSW encouraged patient and family to have alternative facility as well. Patient was agreeable for CSW to initiate SNF search in [place]. with the exclusion of [facility]. Patient did not want to have referral extended to this facility.    CSW will complete FL2 for MD's signature and continue to follow and update patient and daughter when bed offers are made.   Assessment/plan status:  Psychosocial Support/Ongoing Assessment of Needs Other assessment/ plan:   Information/referral to community resources:   SNF packet    PATIENTS/FAMILYS RESPONSE TO PLAN OF CARE: Patient and daughter are agreeable for SNF placement at discharge. Patient is hopeful for [facility], and understands the need for alternative choices if [facility] does not have availability.

## 2012-03-18 NOTE — Progress Notes (Signed)
Patient ID: [name]  female  ZOX:[id number]    DOB: 1925-10-31    DOA: 03/07/2012  PCP: Kaleen Mask, MD  Assessment/Plan:  Recurrent acute sigmoid diverticulitis with intra-abdominal abscess: POD5, post-op ileus 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  - Management per ccs - Tolerating clears, cont IV invanz - TPN started per pharmacy     Hypertension: cont lisinopril   History of Chronic diastolic CHF (congestive heart failure): Currently stable and compensated - Compensated, EF 55-60% based on 2D Echo on 02/25/2009 - On low-dose Lasix    H/O: hypothyroidism - Stable, last TSH 1/14 normal, the patient is not on any replacement  History of COPD currently not wheezing and stable   DVT Prophylaxis: lovenox  Code Status:  Disposition:   Subjective: Pain somewhat better today, no nausea, vomiting, still abdominal distention. Per caregiver, she was confused last night  Objective: Weight change:  No intake or output data in the 24 hours ending 03/18/12 1315 Blood pressure 147/76, pulse 72, temperature 98 F (36.7 C), temperature source Oral, resp. rate 20, height 5\' 1"  (1.549 m), weight 65.726 kg (144 lb 14.4 oz), SpO2 100.00%.  Physical Exam: General: Alert and awake, NAD  CVS: S1-S2 clear Chest: CTAB Abdomen:  Dressing intact, colostomy+, BS+, mild tenderness Extremities: no c/c/e   Lab Results: Basic Metabolic Panel:  Recent Labs Lab 03/17/12 0705 03/18/12 0557  NA 145 146*  K 3.4* 3.6  CL 111 111  CO2 28 29  GLUCOSE 131* 154*  BUN 15 17  CREATININE 0.61 0.51  CALCIUM 8.7 8.5  MG 1.8 2.2  PHOS  --  3.5   Liver Function Tests:  Recent Labs Lab 03/18/12 0557  AST 10  ALT 8  ALKPHOS 54  BILITOT 0.3  PROT 5.3*  ALBUMIN 2.3*   CBC:  Recent Labs Lab 03/17/12 0705 03/18/12 0557  WBC 7.9 10.9*  NEUTROABS  --  9.2*  HGB 12.8 12.4  HCT 38.9 37.8  MCV 86.4 88.1  PLT 364 391   Cardiac Enzymes: No  results found for this basename: CKTOTAL, CKMB, CKMBINDEX, TROPONINI,  in the last 168 hours BNP: No components found with this basename: POCBNP,  CBG:  Recent Labs Lab 03/17/12 2012 03/17/12 2357 03/18/12 0611 03/18/12 0744 03/18/12 1154  GLUCAP 132* 143* 146* 135* 134*     Micro Results: Recent Results (from the past 240 hour(s))  SURGICAL PCR SCREEN     Status: None   Collection Time    03/11/12  9:28 PM      Result Value Range Status   MRSA, PCR NEGATIVE  NEGATIVE Final   Staphylococcus aureus NEGATIVE  NEGATIVE Final   Comment:            The Xpert SA Assay (FDA     approved for NASAL specimens     in patients over 40 years of age),     is one component of     a comprehensive surveillance     program.  Test performance has     been validated by [employer] for patients greater     than or equal to 40 year old.     It is not intended     to diagnose infection nor to     guide or monitor treatment.    Studies/Results: Ct Abdomen Pelvis W Contrast  03/07/2012  *RADIOLOGY REPORT*  Clinical Data: Lower abdominal pain, history diverticulitis, CHF, COPD  CT ABDOMEN AND PELVIS WITH CONTRAST  Technique:  Multidetector CT imaging of the abdomen and pelvis was performed following the standard protocol during bolus administration of intravenous contrast. Sagittal and coronal MPR images reconstructed from axial data set.  Contrast: 80mL OMNIPAQUE IOHEXOL 300 MG/ML  SOLN Dilute oral contrast.  Comparison: 02/07/2012  Findings: Minimal atelectasis posterior right lower lobe base. Improved atelectasis left lower lobe. Mitral annular calcification noted. Bilateral renal cysts. Liver, spleen, pancreas, with kidneys, and adrenal glands otherwise normal appearance.  Stomach incompletely distended, suboptimal assessment wall thickness. Small umbilical hernia containing fat. Diverticulosis of descending and sigmoid colon. Wall thickening identified at mid sigmoid colon with improved  pericolic inflammatory changes compatible with acute diverticulitis. Extraluminal gas collections medial to the thickened sigmoid segment again seen compatible with contained perforation within the mesocolon. The presence of minimal high attenuation within this collection raises the possibility that this collection demonstrates continued communication with the adjacent sigmoid colon lumen.  No discrete drainable fluid/abscess collection identified. No free intraperitoneal air. Small bowel loops and remainder colon showed no additional acute changes. Scattered atherosclerotic calcifications. No mass, adenopathy, free to free of fluid, or hernia. Bones diffusely demineralized.  IMPRESSION: Persistent acute diverticulitis changes of the sigmoid colon with wall thickening and further improved pericolic infiltrative changes. Persistent visualization of extraluminal gas collections within the sigmoid mesocolon at site of prior diverticular abscess, containing a small amount of central high attenuation which could represent GI contrast and indicate continued communication of this collection with the sigmoid lumen. Diverticulosis of sigmoid and descending colon.   Original Report Authenticated By: Ulyses Southward, M.D.     Medications: Scheduled Meds: . antiseptic oral rinse  15 mL Mouth Rinse q12n4p  . chlorhexidine  15 mL Mouth Rinse BID  . enoxaparin (LOVENOX) injection  40 mg Subcutaneous Q24H  . ertapenem  1 g Intravenous Q24H  . feeding supplement  30 mL Oral QAC breakfast  . feeding supplement  1 Container Oral BID BM  . furosemide  5 mg Oral Daily  . insulin aspart  0-9 Units Subcutaneous Q6H  . lisinopril  20 mg Oral Daily  . polyethylene glycol  17 g Oral Daily      LOS: 11 days   [name] M.D. [facility] 03/18/2012, 1:15 PM Pager: [phone]  If 7PM-7AM, please contact night-coverage [email or web address] Password [email or web address]

## 2012-03-19 ENCOUNTER — Inpatient Hospital Stay (HOSPITAL_COMMUNITY): Payer: Medicare Other

## 2012-03-19 LAB — BASIC METABOLIC PANEL
BUN: 18 mg/dL (ref 6–23)
CO2: 30 mEq/L (ref 19–32)
Chloride: 107 mEq/L (ref 96–112)
Glucose, Bld: 144 mg/dL — ABNORMAL HIGH (ref 70–99)
Potassium: 3.7 mEq/L (ref 3.5–5.1)
Sodium: 143 mEq/L (ref 135–145)

## 2012-03-19 LAB — GLUCOSE, CAPILLARY
Glucose-Capillary: 120 mg/dL — ABNORMAL HIGH (ref 70–99)
Glucose-Capillary: 128 mg/dL — ABNORMAL HIGH (ref 70–99)
Glucose-Capillary: 143 mg/dL — ABNORMAL HIGH (ref 70–99)
Glucose-Capillary: 153 mg/dL — ABNORMAL HIGH (ref 70–99)

## 2012-03-19 LAB — URINALYSIS, ROUTINE W REFLEX MICROSCOPIC
Leukocytes, UA: NEGATIVE
Nitrite: NEGATIVE
Protein, ur: 30 mg/dL — AB
Specific Gravity, Urine: 1.027 (ref 1.005–1.030)
Urobilinogen, UA: 0.2 mg/dL (ref 0.0–1.0)

## 2012-03-19 LAB — URINE MICROSCOPIC-ADD ON

## 2012-03-19 LAB — PRO B NATRIURETIC PEPTIDE: Pro B Natriuretic peptide (BNP): 3342 pg/mL — ABNORMAL HIGH (ref 0–450)

## 2012-03-19 MED ORDER — BOOST / RESOURCE BREEZE PO LIQD
1.0000 | Freq: Three times a day (TID) | ORAL | Status: DC
  Administered 2012-03-19 – 2012-03-22 (×6): 1 via ORAL

## 2012-03-19 MED ORDER — ALBUTEROL SULFATE (5 MG/ML) 0.5% IN NEBU
2.5000 mg | INHALATION_SOLUTION | Freq: Three times a day (TID) | RESPIRATORY_TRACT | Status: DC
  Administered 2012-03-19 – 2012-03-21 (×7): 2.5 mg via RESPIRATORY_TRACT
  Filled 2012-03-19 (×8): qty 0.5

## 2012-03-19 MED ORDER — POTASSIUM CHLORIDE 10 MEQ/50ML IV SOLN
10.0000 meq | INTRAVENOUS | Status: AC
  Administered 2012-03-19 (×3): 10 meq via INTRAVENOUS
  Filled 2012-03-19 (×3): qty 50

## 2012-03-19 MED ORDER — FAT EMULSION 20 % IV EMUL
240.0000 mL | INTRAVENOUS | Status: AC
  Administered 2012-03-19: 240 mL via INTRAVENOUS
  Filled 2012-03-19: qty 250

## 2012-03-19 MED ORDER — IPRATROPIUM BROMIDE 0.02 % IN SOLN
0.5000 mg | Freq: Three times a day (TID) | RESPIRATORY_TRACT | Status: DC
  Administered 2012-03-19 – 2012-03-21 (×7): 0.5 mg via RESPIRATORY_TRACT
  Filled 2012-03-19 (×8): qty 2.5

## 2012-03-19 MED ORDER — FUROSEMIDE 10 MG/ML IJ SOLN
40.0000 mg | Freq: Once | INTRAMUSCULAR | Status: AC
  Administered 2012-03-19: 40 mg via INTRAVENOUS
  Filled 2012-03-19: qty 4

## 2012-03-19 MED ORDER — ALBUTEROL SULFATE (5 MG/ML) 0.5% IN NEBU: 2.5000 mg | INHALATION_SOLUTION | Freq: Four times a day (QID) | RESPIRATORY_TRACT | Status: DC

## 2012-03-19 MED ORDER — FUROSEMIDE 20 MG PO TABS
20.0000 mg | ORAL_TABLET | Freq: Every day | ORAL | Status: DC
  Filled 2012-03-19: qty 1

## 2012-03-19 MED ORDER — TRACE MINERALS CR-CU-F-FE-I-MN-MO-SE-ZN IV SOLN
INTRAVENOUS | Status: AC
  Administered 2012-03-19: 17:00:00 via INTRAVENOUS
  Filled 2012-03-19: qty 2000

## 2012-03-19 NOTE — Progress Notes (Signed)
Patient ID: [name], female   DOB: [birth year], 79 y.o.   MRN: [id number] 7 Days Post-Op  Subjective: Pt feels ok.  Starting passing stool in her bag.  Nausea after narcotic pain meds, but none since taking tylenol.  She is c/o SOB and seems to be having some increased WOB since she just transferred from the bedside commode to her chair.  Objective: Vital signs in last 24 hours: Temp:  [97.7 F (36.5 C)-98 F (36.7 C)] 97.7 F (36.5 C) (03/19 0458) Pulse Rate:  [72-78] 75 (03/19 0458) Resp:  [18] 18 (03/19 0458) BP: (135-151)/(77-88) 151/77 mmHg (03/19 0458) SpO2:  [96 %-100 %] 96 % (03/19 0458) Last BM Date: 03/18/12  Intake/Output from previous day: 03/18 0701 - 03/19 0700 In: 390 [P.O.:30; TPN:360] Out: 250 [Stool:250] Intake/Output this shift:    PE: Abd: soft, tender appropriately, +BS, ostomy with stool output and air.  Midline wound is dry and packed. Heart: regular Lungs: decrease BS at bases, but clear for the most part, but increased WOB  Lab Results:   Recent Labs  03/17/12 0705 03/18/12 0557  WBC 7.9 10.9*  HGB 12.8 12.4  HCT 38.9 37.8  PLT 364 391   BMET  Recent Labs  03/18/12 0557 03/19/12 0541  NA 146* 143  K 3.6 3.7  CL 111 107  CO2 29 30  GLUCOSE 154* 144*  BUN 17 18  CREATININE 0.51 0.46*  CALCIUM 8.5 8.4   PT/INR No results found for this basename: LABPROT, INR,  in the last 72 hours CMP     Component Value Date/Time   NA 143 03/19/2012 0541   K 3.7 03/19/2012 0541   CL 107 03/19/2012 0541   CO2 30 03/19/2012 0541   GLUCOSE 144* 03/19/2012 0541   BUN 18 03/19/2012 0541   CREATININE 0.46* 03/19/2012 0541   CREATININE 0.70 01/14/2012 1330   CALCIUM 8.4 03/19/2012 0541   PROT 5.3* 03/18/2012 0557   ALBUMIN 2.3* 03/18/2012 0557   AST 10 03/18/2012 0557   ALT 8 03/18/2012 0557   ALKPHOS 54 03/18/2012 0557   BILITOT 0.3 03/18/2012 0557   GFRNONAA 89* 03/19/2012 0541   GFRAA >90 03/19/2012 0541   Lipase  No results found for this basename:  lipase       Studies/Results: No results found.  Anti-infectives: Anti-infectives   Start     Dose/Rate Route Frequency Ordered Stop   03/08/12 1900  ertapenem (INVANZ) 1 g in sodium chloride 0.9 % 50 mL IVPB  Status:  Discontinued     1 g 100 mL/hr over 30 Minutes Intravenous Every 24 hours 03/08/12 1408 03/19/12 1106   03/07/12 1800  ertapenem (INVANZ) 1 g in sodium chloride 0.9 % 50 mL IVPB     1 g 100 mL/hr over 30 Minutes Intravenous To Emergency Dept 03/07/12 1721 03/07/12 1955   03/07/12 1730  ertapenem (INVANZ) 1 g in sodium chloride 0.9 % 50 mL IVPB  Status:  Discontinued     1 g 100 mL/hr over 30 Minutes Intravenous  Once 03/07/12 1716 03/07/12 1717       Assessment/Plan  1. S/p Harmtan's for diverticulitis 2. Shortness of breath, question secondary to mild fluid overload. H/O CHF 3. Dysuria 4. Prolonged post op ileus, resolving Plan: 1. Agree with UA 2. Start clear liquids as ileus seems to be resolving. 3. Defer SOB to primary team 4. Patient needs to mobilize more; however, she states she gets very SOB when mobilizing.  She says she normally takes 2 different inhalers at home.  Unsure if she is on those here or not. 5. On Invanz for 11 days.  No further need.  Will dc  LOS: 12 days    OSBORNE,KELLY E 03/19/2012, 11:09 AM Pager: [phone]  Her ostomy is working now.  Abdomen improving as well.  Slowly advance diet.  Ileus appears to be resolving.

## 2012-03-19 NOTE — Progress Notes (Signed)
NUTRITION FOLLOW UP  Intervention:   - Diet advancement per MD   - Resource Breeze TID. Each supplement provides 250 kcal and 9 grams of protein.  - RD to continue to follow   Nutrition Dx:   Inadequate oral intake, ongoing  Monitor:   Toleration of TPN, weight trends, labs   Assessment:   Pt admitted with abdominal pain, found to have recurrent flare-up of diverticulitis with small prediverticular abscess which is recurring. She has lost 30lb in last 15 months due to abdominal pain. Now s/p sigmoid resection, Hartman's pouch, & end colostomy for smoldering diverticulitis; prolonged ileus.  Pt is POD#7.   Pt started TPN 3/17. Per pharmacy note 3/18, pt tolerated initiation of TPN overnight. Current nutrition: Clinimix E5/20 at 65 ml/hr (goal, provides 78g protein per day and an average of ~1580 kcal/day)  Pt with bowel movement yesterday. Per MD note, pt's ileus seems to be resolving.  Pt now advanced to clear liquid diet. Dietetic intern spoke with pt and her son about her nutrition status. Son concerned pt may not be able to eat enough to meet her needs. Intern suggested oral nutrition supplement consistent with clear liquid diet. Pt and son agreeable to supplement.   Labs reviewed, K, Mg, and Phos wnl. Height: Ht Readings from Last 1 Encounters:  03/14/12 5\' 1"  (1.549 m)    Weight Status:   Wt Readings from Last 1 Encounters:  03/14/12 144 lb 14.4 oz (65.726 kg)    Re-estimated needs:  Kcal: 1500-1700 Protein: 75-90 g Fluid: 1.5 L/day  Skin: Left lower quad colostomy  Diet Order: Clear liquids    Intake/Output Summary (Last 24 hours) at 03/19/12 1032 Last data filed at 03/19/12 0459  Gross per 24 hour  Intake    390 ml  Output    250 ml  Net    140 ml    Last BM: 3/18   Labs:   Recent Labs Lab 03/17/12 0705 03/18/12 0557 03/19/12 0541  NA 145 146* 143  K 3.4* 3.6 3.7  CL 111 111 107  CO2 28 29 30   BUN 15 17 18   CREATININE 0.61 0.51 0.46*  CALCIUM  8.7 8.5 8.4  MG 1.8 2.2  --   PHOS  --  3.5  --   GLUCOSE 131* 154* 144*    CBG (last 3)   Recent Labs  03/18/12 2016 03/19/12 0023 03/19/12 0630  GLUCAP 152* 153* 143*    Scheduled Meds: . antiseptic oral rinse  15 mL Mouth Rinse q12n4p  . chlorhexidine  15 mL Mouth Rinse BID  . enoxaparin (LOVENOX) injection  40 mg Subcutaneous Q24H  . ertapenem  1 g Intravenous Q24H  . feeding supplement  30 mL Oral QAC breakfast  . feeding supplement  1 Container Oral BID BM  . furosemide  5 mg Oral Daily  . insulin aspart  0-9 Units Subcutaneous Q6H  . lisinopril  20 mg Oral Daily  . polyethylene glycol  17 g Oral Daily  . potassium chloride  10 mEq Intravenous Q1 Hr x 3    Continuous Infusions: . sodium chloride    . TPN (CLINIMIX) +/- additives     And  . fat emulsion    . TPN [facility]) +/- additives 65 mL/hr at 03/18/12 1748    Belenda Cruise  Dietetic Intern Pager: [phone]  Loyce Dys, MS RD LDN Clinical Inpatient Dietitian Pager: [phone] Weekend/After hours pager: [phone]

## 2012-03-19 NOTE — Progress Notes (Signed)
Physical Therapy Treatment Patient Details Name: [name] MRN: [id number] DOB: 10/30/1925 Today's Date: 03/19/2012 Time: 4098-1191 PT Time Calculation (min): 24 min  PT Assessment / Plan / Recommendation Comments on Treatment Session  Pt making progress towards PT goals at this time. Less assistance required for ambulation and better activity tolerance with SpO2 on rm air remaining stable >90.  Will continue to monitor and progress activity as tolerated.    Follow Up Recommendations  SNF     Does the patient have the potential to tolerate intense rehabilitation     Barriers to Discharge        Equipment Recommendations  None recommended by PT    Recommendations for Other Services    Frequency Min 3X/week   Plan Discharge plan needs to be updated    Precautions / Restrictions Precautions Precautions: Fall;Other (comment) (colostomy) Precaution Comments: colostomy Restrictions Weight Bearing Restrictions: No   Pertinent Vitals/Pain 8/10 pain reported; NAD    Mobility  Bed Mobility Bed Mobility: Sit to Supine;Scooting to HOB Sit to Supine: 4: Min assist Scooting to [facility]: 4: Min assist Details for Bed Mobility Assistance: Pt exhausted and needed assist to help position in bed Transfers Transfers: Sit to Stand;Stand to Sit Sit to Stand: 4: Min guard;With armrests;From chair/3-in-1 Stand to Sit: 4: Min assist;To bed Details for Transfer Assistance: Maximal Verbal cues for safety and hand placement; performed x4 to reinforce proper technique although patient demonstrates little carry over. Pt abondoning rw and attempting to sit before even reaching the bed completely. Mod assist to prevent patient from sitting and multimodal cues required to illicit safe technique Ambulation/Gait Ambulation/Gait Assistance: 4: Min guard Ambulation Distance (Feet): 30 Feet Assistive device: Rolling walker Ambulation/Gait Assistance Details: one rest break seated; o2 on rm air remained 92-96  during ambulation. Gait Pattern: Step-through pattern;Decreased stride length;Trunk flexed Gait velocity: decreased General Gait Details: pt more steady with ambulationl VC's still required for positioning inside rw      PT Goals Acute Rehab PT Goals PT Goal Formulation: With patient Time For Goal Achievement: 03/15/12 Potential to Achieve Goals: Good Pt will go Supine/Side to Sit: Independently;with HOB 0 degrees Pt will go Sit to Supine/Side: Independently;with HOB 0 degrees PT Goal: Sit to Supine/Side - Progress: Progressing toward goal Pt will go Sit to Stand: with modified independence;with upper extremity assist PT Goal: Sit to Stand - Progress: Progressing toward goal Pt will Ambulate: >150 feet;with modified independence;with rolling walker PT Goal: Ambulate - Progress: Progressing toward goal Pt will Go Up / Down Stairs: 1-2 stairs;with supervision;with least restrictive assistive device  Visit Information  Last PT Received On: 03/19/12    Subjective Data  Subjective: I really want to get back to bed   Cognition  Cognition Overall Cognitive Status: Appears within functional limits for tasks assessed/performed Arousal/Alertness: Awake/alert Orientation Level: Appears intact for tasks assessed Behavior During Session: [facility] for tasks performed       End of Session PT - End of Session Equipment Utilized During Treatment: Gait belt;Oxygen (2 liters on rm air prior to session and post session) Activity Tolerance: Patient limited by fatigue;Patient limited by pain;Other (comment) Patient left: in bed;with call bell/phone within reach;with family/visitor present Nurse Communication: Mobility status;Other (comment) (O2 concerns and desaturation)   GP     [name] 03/19/2012, 10:55 AM [name], PT DPT  [phone]

## 2012-03-19 NOTE — Progress Notes (Signed)
Rehab Admissions Coordinator Note:  Patient was screened by Clois Dupes for appropriateness for an Inpatient Acute Rehab Consult. [employer] Medicare will not approve IP rehab admission for this diagnosis.  At this time, we are recommending Skilled Nursing Facility.  Clois Dupes 03/19/2012, 1:59 PM  I can be reached at [phone].

## 2012-03-19 NOTE — Progress Notes (Signed)
Patient ID: [name]  female  WUJ:[id number]    DOB: 02/23/1946    DOA: 03/07/2012  PCP: Kaleen Mask, MD  Assessment/Plan:  Recurrent acute sigmoid diverticulitis with intra-abdominal abscess:  post-op ileus APPEARS TO BE RESOLVING, passing stool in colostomy bag.   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  - placed on clears, on TPN  - mobilize, patient's family also interested in inpatient-rehab, placed CIR consult    Hypertension: cont lisinopril   History of Chronic diastolic CHF (congestive heart failure):appears to somewhat fluid overloaded now after starting TPN -  EF 55-60% based on 2D Echo on 02/25/2009 - obtain BNP, CXR, will give one dose of 40mg  IV lasix, place on home dose of 20mg  daily from tomorrow, adjust according to symptoms     H/O: hypothyroidism - Stable, last TSH 1/14 normal, the patient is not on any replacement  History of COPD  - placed on alb and atrovent nebs  DVT Prophylaxis: lovenox  Code Status:  Disposition: to rehab hopefully 1-2 days when stable and cleared by surgery   Subjective: No nausea, vomiting, started passing stool in her bag  Objective: Weight change:   Intake/Output Summary (Last 24 hours) at 03/19/12 1342 Last data filed at 03/19/12 0459  Gross per 24 hour  Intake      0 ml  Output    250 ml  Net   -250 ml   Blood pressure 151/77, pulse 75, temperature 97.7 F (36.5 C), temperature source Oral, resp. rate 18, height 5\' 1"  (1.549 m), weight 65.726 kg (144 lb 14.4 oz), SpO2 96.00%.  Physical Exam: General: Alert and awake, NAD  CVS: S1-S2 clear Chest: Decreased breath sound at the bases, no active rhonchi or wheezing Abdomen:  Dressing intact, colostomy with output Extremities: no c/c/e   Lab Results: Basic Metabolic Panel:  Recent Labs Lab 03/18/12 0557 03/19/12 0541  NA 146* 143  K 3.6 3.7  CL 111 107  CO2 29 30  GLUCOSE 154* 144*  BUN 17 18  CREATININE 0.51 0.46*   CALCIUM 8.5 8.4  MG 2.2  --   PHOS 3.5  --    Liver Function Tests:  Recent Labs Lab 03/18/12 0557  AST 10  ALT 8  ALKPHOS 54  BILITOT 0.3  PROT 5.3*  ALBUMIN 2.3*   CBC:  Recent Labs Lab 03/17/12 0705 03/18/12 0557  WBC 7.9 10.9*  NEUTROABS  --  9.2*  HGB 12.8 12.4  HCT 38.9 37.8  MCV 86.4 88.1  PLT 364 391   Cardiac Enzymes: No results found for this basename: CKTOTAL, CKMB, CKMBINDEX, TROPONINI,  in the last 168 hours BNP: No components found with this basename: POCBNP,  CBG:  Recent Labs Lab 03/18/12 1634 03/18/12 2016 03/19/12 0023 03/19/12 0630 03/19/12 1146  GLUCAP 132* 152* 153* 143* 128*     Micro Results: Recent Results (from the past 240 hour(s))  SURGICAL PCR SCREEN     Status: None   Collection Time    03/11/12  9:28 PM      Result Value Range Status   MRSA, PCR NEGATIVE  NEGATIVE Final   Staphylococcus aureus NEGATIVE  NEGATIVE Final   Comment:            The Xpert SA Assay (FDA     approved for NASAL specimens     in patients over 3 years of age),     is one component of     a  comprehensive surveillance     program.  Test performance has     been validated by [facility] for patients greater     than or equal to 12 year old.     It is not intended     to diagnose infection nor to     guide or monitor treatment.    Studies/Results: Ct Abdomen Pelvis W Contrast  03/07/2012  *RADIOLOGY REPORT*  Clinical Data: Lower abdominal pain, history diverticulitis, CHF, COPD  CT ABDOMEN AND PELVIS WITH CONTRAST  Technique:  Multidetector CT imaging of the abdomen and pelvis was performed following the standard protocol during bolus administration of intravenous contrast. Sagittal and coronal MPR images reconstructed from axial data set.  Contrast: 80mL OMNIPAQUE IOHEXOL 300 MG/ML  SOLN Dilute oral contrast.  Comparison: 02/07/2012  Findings: Minimal atelectasis posterior right lower lobe base. Improved atelectasis left lower lobe. Mitral  annular calcification noted. Bilateral renal cysts. Liver, spleen, pancreas, with kidneys, and adrenal glands otherwise normal appearance.  Stomach incompletely distended, suboptimal assessment wall thickness. Small umbilical hernia containing fat. Diverticulosis of descending and sigmoid colon. Wall thickening identified at mid sigmoid colon with improved pericolic inflammatory changes compatible with acute diverticulitis. Extraluminal gas collections medial to the thickened sigmoid segment again seen compatible with contained perforation within the mesocolon. The presence of minimal high attenuation within this collection raises the possibility that this collection demonstrates continued communication with the adjacent sigmoid colon lumen.  No discrete drainable fluid/abscess collection identified. No free intraperitoneal air. Small bowel loops and remainder colon showed no additional acute changes. Scattered atherosclerotic calcifications. No mass, adenopathy, free to free of fluid, or hernia. Bones diffusely demineralized.  IMPRESSION: Persistent acute diverticulitis changes of the sigmoid colon with wall thickening and further improved pericolic infiltrative changes. Persistent visualization of extraluminal gas collections within the sigmoid mesocolon at site of prior diverticular abscess, containing a small amount of central high attenuation which could represent GI contrast and indicate continued communication of this collection with the sigmoid lumen. Diverticulosis of sigmoid and descending colon.   Original Report Authenticated By: Ulyses Southward, M.D.     Medications: Scheduled Meds: . albuterol  2.5 mg Nebulization TID  . antiseptic oral rinse  15 mL Mouth Rinse q12n4p  . chlorhexidine  15 mL Mouth Rinse BID  . enoxaparin (LOVENOX) injection  40 mg Subcutaneous Q24H  . feeding supplement  30 mL Oral QAC breakfast  . feeding supplement  1 Container Oral TID BM  . furosemide  40 mg Intravenous Once   . [START ON 03/20/2012] furosemide  20 mg Oral Daily  . insulin aspart  0-9 Units Subcutaneous Q6H  . ipratropium  0.5 mg Nebulization TID  . lisinopril  20 mg Oral Daily  . polyethylene glycol  17 g Oral Daily      LOS: 12 days   Serenna Deroy M.D. [facility] 03/19/2012, 1:42 PM Pager: [phone]  If 7PM-7AM, please contact night-coverage [email or web address] Password [email or web address]

## 2012-03-19 NOTE — Progress Notes (Signed)
PARENTERAL NUTRITION CONSULT NOTE  Pharmacy Consult for TPN Indication: Prolonged ileus  Allergies  Allergen Reactions  . Indomethacin Anaphylaxis and Other (See Comments)    "took it fine for awhile; one day I stopped breathing and I ended up in the hospital" (01/02/2012)    Patient Measurements: Height: 5\' 1"  (154.9 cm) Weight: 144 lb 14.4 oz (65.726 kg) IBW/kg (Calculated) : 47.8 Adjusted Body Weight: 53.2 Usual Weight: 65.7  Vital Signs: Temp: 97.7 F (36.5 C) (03/19 0458) Temp src: Oral (03/19 0458) BP: 151/77 mmHg (03/19 0458) Pulse Rate: 75 (03/19 0458) Intake/Output from previous day: 03/18 0701 - 03/19 0700 In: 390 [P.O.:30; TPN:360] Out: 250 [Stool:250] Intake/Output from this shift:    Labs:  Recent Labs  03/17/12 0705 03/18/12 0557  WBC 7.9 10.9*  HGB 12.8 12.4  HCT 38.9 37.8  PLT 364 391     Recent Labs  03/17/12 0705 03/18/12 0425 03/18/12 0557 03/19/12 0541  NA 145  --  146* 143  K 3.4*  --  3.6 3.7  CL 111  --  111 107  CO2 28  --  29 30  GLUCOSE 131*  --  154* 144*  BUN 15  --  17 18  CREATININE 0.61  --  0.51 0.46*  CALCIUM 8.7  --  8.5 8.4  MG 1.8  --  2.2  --   PHOS  --   --  3.5  --   PROT  --   --  5.3*  --   ALBUMIN  --   --  2.3*  --   AST  --   --  10  --   ALT  --   --  8  --   ALKPHOS  --   --  54  --   BILITOT  --   --  0.3  --   PREALBUMIN  --   --  11.7*  --   TRIG  --  124  --   --   CHOL  --  94  --   --    Estimated Creatinine Clearance: 45.5 ml/min (by C-G formula based on Cr of 0.46).    Recent Labs  03/18/12 2016 03/19/12 0023 03/19/12 0630  GLUCAP 152* 153* 143*    Medical History: Past Medical History  Diagnosis Date  . Hypertension   . Goiter     radioactive iodine ablation/notes 01/02/2012  . COPD (chronic obstructive pulmonary disease)   . CHF (congestive heart failure)   . Pneumonia ?01/02/2012; ? 2009  . SOB (shortness of breath)     'all the time" (01/02/2012)  . Hypothyroidism     "I have  taken Synthroid before" (01/02/2012)  . Arthritis     "right shoulder" (01/02/2012)  . Diverticulitis     Medications:  Scheduled:  . antiseptic oral rinse  15 mL Mouth Rinse q12n4p  . chlorhexidine  15 mL Mouth Rinse BID  . enoxaparin (LOVENOX) injection  40 mg Subcutaneous Q24H  . ertapenem  1 g Intravenous Q24H  . feeding supplement  30 mL Oral QAC breakfast  . feeding supplement  1 Container Oral BID BM  . furosemide  5 mg Oral Daily  . insulin aspart  0-9 Units Subcutaneous Q6H  . lisinopril  20 mg Oral Daily  . polyethylene glycol  17 g Oral Daily  . [COMPLETED] potassium chloride  10 mEq Intravenous Q1 Hr x 3    Insulin Requirements in the past 24 hours: 3 units;  SSI sens q6h  Current Nutrition:  NPO; Clinimix E5/20 at 65 ml/hr (goal, provides 78g protein per day and an average of ~1580 kcal/day)   Nutritional Goals:  1640-1820 kCal, 70-80 grams of protein per day  Assessment: Admitted 3/7 with abdominal pain x 1 week.  She has lost 30lb in last 15 months due to abdominal pain.  Now s/p sigmoid resection, Hartman's pouch, & end colostomy for smoldering diverticulitis; prolonged ileus.  Tolerated initiation of TPN overnight.  GI:  POD#7, prolonged ileus.  (+)nausea, abd distention & diffuse tenderness.  (-)flatus, hunger.  Endo:  No hx DM.  CBG ok control.  Hx goiter, radioiodine tx , on no thyroid replacement pta.    Lytes:  K slightly improved with K runs - up to 3.7;  Goal K > 4 and Mg > 2 with ileus.  [facility]F: [facility] at [facility].  Renal:  Cr 0.46, uop -- not well recorded yesterday  Pulm:  Hx COPD, not wheezing.  3L [place]  Cards:  Hx HTN, CHF.  VSS.  Lisinopril, Lasix 5mg  daily  Hepatobil:   LFTs wnl on 3/7  Neuro: No major issues.  Vicodin for pain control.  ID:   AFeb, last WBC 10.9; Invanz 3/7>>continues:  Diverticulitis & abscess.  Best Practices:  Lovenox VTE px TPN Access:  PICC for TNA TPN day#: 3  Plan:  - Continue Clinimix E5/20 at 65 ml/hr -- goal. - Provide  lipids at 10 ml/hr, MVI, TE MWF only 2/2 national backorder - Repeat K-runs x 3 - Continue NS at [facility] - Continue sensitive SSI q6 - Continue standing TPN labs, f/u am bmp, cbgs  Vendela Troung L. Illene Bolus, PharmD, BCPS Clinical Pharmacist Pager: [phone] Pharmacy: [phone] 03/19/2012 7:39 AM

## 2012-03-20 DIAGNOSIS — E46 Unspecified protein-calorie malnutrition: Secondary | ICD-10-CM

## 2012-03-20 LAB — COMPREHENSIVE METABOLIC PANEL
ALT: 9 U/L (ref 0–35)
AST: 16 U/L (ref 0–37)
Alkaline Phosphatase: 57 U/L (ref 39–117)
CO2: 33 mEq/L — ABNORMAL HIGH (ref 19–32)
Calcium: 8.4 mg/dL (ref 8.4–10.5)
Chloride: 102 mEq/L (ref 96–112)
GFR calc non Af Amer: >90 mL/min (ref >90)
Potassium: 3.6 mEq/L (ref 3.5–5.1)
Sodium: 141 mEq/L (ref 135–145)

## 2012-03-20 LAB — GLUCOSE, CAPILLARY
Glucose-Capillary: 134 mg/dL — ABNORMAL HIGH (ref 70–99)
Glucose-Capillary: 137 mg/dL — ABNORMAL HIGH (ref 70–99)
Glucose-Capillary: 143 mg/dL — ABNORMAL HIGH (ref 70–99)

## 2012-03-20 LAB — URINE CULTURE
Colony Count: NO GROWTH
Culture: NO GROWTH

## 2012-03-20 MED ORDER — PIPERACILLIN-TAZOBACTAM 3.375 G IVPB
3.3750 g | Freq: Three times a day (TID) | INTRAVENOUS | Status: DC
  Administered 2012-03-20 – 2012-03-27 (×21): 3.375 g via INTRAVENOUS
  Filled 2012-03-20 (×28): qty 50

## 2012-03-20 MED ORDER — FUROSEMIDE 10 MG/ML IJ SOLN
40.0000 mg | Freq: Once | INTRAMUSCULAR | Status: AC
  Administered 2012-03-20: 40 mg via INTRAVENOUS
  Filled 2012-03-20: qty 4

## 2012-03-20 MED ORDER — POTASSIUM CHLORIDE 10 MEQ/50ML IV SOLN
10.0000 meq | INTRAVENOUS | Status: AC
  Administered 2012-03-20 (×4): 10 meq via INTRAVENOUS
  Filled 2012-03-20 (×4): qty 50

## 2012-03-20 MED ORDER — CLINIMIX E/DEXTROSE (5/20) 5 % IV SOLN
INTRAVENOUS | Status: AC
  Administered 2012-03-20: 18:00:00 via INTRAVENOUS
  Filled 2012-03-20 (×2): qty 2000

## 2012-03-20 MED ORDER — ENSURE COMPLETE PO LIQD
237.0000 mL | Freq: Three times a day (TID) | ORAL | Status: DC
  Administered 2012-03-20 – 2012-03-22 (×5): 237 mL via ORAL

## 2012-03-20 MED ORDER — FUROSEMIDE 10 MG/ML IJ SOLN
40.0000 mg | Freq: Every day | INTRAMUSCULAR | Status: DC
  Administered 2012-03-20 – 2012-03-28 (×8): 40 mg via INTRAVENOUS
  Filled 2012-03-20 (×9): qty 4

## 2012-03-20 NOTE — Progress Notes (Signed)
Patient ID: [name], female   DOB: Apr 13, 1925, 79 y.o.   MRN: [id number] 8 Days Post-Op  Subjective: Pt feels ok today.  Doesn't have much appetite, as expected.  Legitimate concerns from patient and family about patient not being ambulated and other concerns.  Objective: Vital signs in last 24 hours: Temp:  [97.6 F (36.4 C)-98.4 F (36.9 C)] 98.4 F (36.9 C) (03/20 0550) Pulse Rate:  [75-86] 79 (03/20 0550) Resp:  [18] 18 (03/20 0550) BP: (139-149)/(62-77) 149/72 mmHg (03/20 0550) SpO2:  [96 %-99 %] 96 % (03/20 0550) Last BM Date: 03/19/12  Intake/Output from previous day: 03/19 0701 - 03/20 0700 In: 1417.6 [P.O.:240; TPN:1177.6] Out: 125 [Stool:125] Intake/Output this shift:    PE: Abd: soft, few BS, mild distention, stool in ostomy, wound is closed with staples.  Minimally tender  Lab Results:   Recent Labs  03/18/12 0557  WBC 10.9*  HGB 12.4  HCT 37.8  PLT 391   BMET  Recent Labs  03/19/12 0541 03/20/12 0540  NA 143 141  K 3.7 3.6  CL 107 102  CO2 30 33*  GLUCOSE 144* 142*  BUN 18 20  CREATININE 0.46* 0.42*  CALCIUM 8.4 8.4   PT/INR No results found for this basename: LABPROT, INR,  in the last 72 hours CMP     Component Value Date/Time   NA 141 03/20/2012 0540   K 3.6 03/20/2012 0540   CL 102 03/20/2012 0540   CO2 33* 03/20/2012 0540   GLUCOSE 142* 03/20/2012 0540   BUN 20 03/20/2012 0540   CREATININE 0.42* 03/20/2012 0540   CREATININE 0.70 01/14/2012 1330   CALCIUM 8.4 03/20/2012 0540   PROT 5.3* 03/20/2012 0540   ALBUMIN 2.3* 03/20/2012 0540   AST 16 03/20/2012 0540   ALT 9 03/20/2012 0540   ALKPHOS 57 03/20/2012 0540   BILITOT 0.4 03/20/2012 0540   GFRNONAA >90 03/20/2012 0540   GFRAA >90 03/20/2012 0540   Lipase  No results found for this basename: lipase       Studies/Results: Dg Chest Port 1 View  03/19/2012  *RADIOLOGY REPORT*  Clinical Data: .  PORTABLE CHEST - 1 VIEW  Comparison: PA and lateral chest 02/10/2012.  Findings: The  patient has a new right PICC with the tip projecting at the superior cavoatrial junction.  There is new extensive right basilar airspace disease.  Milder degree of airspace opacity is seen in the left lung base.  Cardiomegaly is noted.  No pneumothorax.  IMPRESSION:  1.  Tip of right PICC projects over the superior cavoatrial junction. 2.  Right much worse than left basilar airspace disease could be due to asymmetric edema.  Pneumonia or aspiration in the right base could create a similar appearance.   Original Report Authenticated By: Holley Dexter, M.D.     Anti-infectives: Anti-infectives   Start     Dose/Rate Route Frequency Ordered Stop   03/20/12 0900  piperacillin-tazobactam (ZOSYN) IVPB 3.375 g     3.375 g 12.5 mL/hr over 240 Minutes Intravenous 3 times per day 03/20/12 0839     03/08/12 1900  ertapenem (INVANZ) 1 g in sodium chloride 0.9 % 50 mL IVPB  Status:  Discontinued     1 g 100 mL/hr over 30 Minutes Intravenous Every 24 hours 03/08/12 1408 03/19/12 1106   03/07/12 1800  ertapenem (INVANZ) 1 g in sodium chloride 0.9 % 50 mL IVPB     1 g 100 mL/hr over 30 Minutes Intravenous To Emergency  Dept 03/07/12 1721 03/07/12 1955   03/07/12 1730  ertapenem (INVANZ) 1 g in sodium chloride 0.9 % 50 mL IVPB  Status:  Discontinued     1 g 100 mL/hr over 30 Minutes Intravenous  Once 03/07/12 1716 03/07/12 1717       Assessment/Plan  1. S/p Hartman's 2. ?PNA  Right base 3. Deconditioning 4. PCM/TNA  Plan: 1. Cont full strength TNA right now for caloric support as patient is barely eating due to a decrease in appetite, which is expected post op. 2. Advance to full liquids and add ensure 3. pulm per primary team 4. Many concerns were address with Dorina Hoyer and Collene Leyden and AD. 5. Patient MUST ambulate in halls BID-TID, TID preferably  LOS: 13 days    OSBORNE,KELLY E 03/20/2012, 11:05 AM Pager: [phone]  She looks better today.  Ostomy still working but not much appetite.   Wound looks good.  Advance diet slowly. Mobilize as tolerated.

## 2012-03-20 NOTE — Progress Notes (Addendum)
PARENTERAL NUTRITION CONSULT NOTE  Pharmacy Consult for TPN Indication: Prolonged ileus  Allergies  Allergen Reactions  . Indomethacin Anaphylaxis and Other (See Comments)    "took it fine for awhile; one day I stopped breathing and I ended up in the hospital" (01/02/2012)    Patient Measurements: Height: 5\' 1"  (154.9 cm) Weight: 144 lb 14.4 oz (65.726 kg) IBW/kg (Calculated) : 47.8 Adjusted Body Weight: 53.2 Usual Weight: 65.7  Vital Signs: Temp: 98.4 F (36.9 C) (03/20 0550) Temp src: Oral (03/20 0550) BP: 149/72 mmHg (03/20 0550) Pulse Rate: 79 (03/20 0550) Intake/Output from previous day: 03/19 0701 - 03/20 0700 In: 1417.6 [P.O.:240; TPN:1177.6] Out: 125 [Stool:125] Intake/Output from this shift:    Labs:  Recent Labs  03/18/12 0557  WBC 10.9*  HGB 12.4  HCT 37.8  PLT 391     Recent Labs  03/18/12 0425 03/18/12 0557 03/19/12 0541 03/20/12 0540  NA  --  146* 143 141  K  --  3.6 3.7 3.6  CL  --  111 107 102  CO2  --  29 30 33*  GLUCOSE  --  154* 144* 142*  BUN  --  17 18 20   CREATININE  --  0.51 0.46* 0.42*  CALCIUM  --  8.5 8.4 8.4  MG  --  2.2  --  1.9  PHOS  --  3.5  --  3.2  PROT  --  5.3*  --  5.3*  ALBUMIN  --  2.3*  --  2.3*  AST  --  10  --  16  ALT  --  8  --  9  ALKPHOS  --  54  --  57  BILITOT  --  0.3  --  0.4  PREALBUMIN  --  11.7*  --   --   TRIG 124  --   --   --   CHOL 94  --   --   --    Estimated Creatinine Clearance: 45.5 ml/min (by C-G formula based on Cr of 0.42).    Recent Labs  03/19/12 1801 03/19/12 2344 03/20/12 0552  GLUCAP 120* 137* 147*    Medical History: Past Medical History  Diagnosis Date  . Hypertension   . Goiter     radioactive iodine ablation/notes 01/02/2012  . COPD (chronic obstructive pulmonary disease)   . CHF (congestive heart failure)   . Pneumonia ?01/02/2012; ? 2009  . SOB (shortness of breath)     'all the time" (01/02/2012)  . Hypothyroidism     "I have taken Synthroid before"  (01/02/2012)  . Arthritis     "right shoulder" (01/02/2012)  . Diverticulitis     Medications:  Scheduled:  . albuterol  2.5 mg Nebulization TID  . antiseptic oral rinse  15 mL Mouth Rinse q12n4p  . chlorhexidine  15 mL Mouth Rinse BID  . enoxaparin (LOVENOX) injection  40 mg Subcutaneous Q24H  . feeding supplement  30 mL Oral QAC breakfast  . feeding supplement  1 Container Oral TID BM  . [COMPLETED] furosemide  40 mg Intravenous Once  . furosemide  20 mg Oral Daily  . insulin aspart  0-9 Units Subcutaneous Q6H  . ipratropium  0.5 mg Nebulization TID  . lisinopril  20 mg Oral Daily  . polyethylene glycol  17 g Oral Daily  . [COMPLETED] potassium chloride  10 mEq Intravenous Q1 Hr x 3  . [DISCONTINUED] albuterol  2.5 mg Nebulization Q6H  . [DISCONTINUED] ertapenem  1  g Intravenous Q24H  . [DISCONTINUED] feeding supplement  1 Container Oral BID BM  . [DISCONTINUED] furosemide  5 mg Oral Daily    Insulin Requirements in the past 24 hours: 3 units; SSI sens q6h  Current Nutrition:  NPO; Clinimix E5/20 at 65 ml/hr (goal, provides 78g protein per day and an average of ~1580 kcal/day)  Nutritional Goals:  1640-1820 kCal, 70-80 grams of protein per day  Assessment: Admitted 3/7 with abdominal pain x 1 week.  She has lost 30lb in last 15 months due to abdominal pain.  Now s/p sigmoid resection, Hartman's pouch, & end colostomy for smoldering diverticulitis; prolonged ileus.    GI:  POD#8, prolonged ileus - now resolving per MD note.  Diet advanced 3/19 to clears.  Noted 240 cc po intake yesterday.  Now passing stool into her ostomy.  Endo:  No hx DM.  CBG good with minimal SSI.  Hx goiter, radioiodine tx , on no thyroid replacement pta.    Lytes:  K remains low normal at 3.6 despite repletion x 2 days.  Goal K > 4 and Mg > 2 with ileus.  IVF: NS at [facility].  Renal:  Cr 0.42, uop -- not well recorded yesterday  Pulm:  Hx COPD, not wheezing.  1L [place]  Cards:  Hx HTN, CHF.  VSS.   Lisinopril, Lasix 5mg  daily  Hepatobil:   LFTs wnl on 3/7  Neuro: No major issues.  Vicodin for pain control.  ID:   AFeb, last WBC 10.9; Invanz 3/7>3/18:  Diverticulitis & abscess.  Best Practices:  Lovenox VTE px TPN Access:  PICC for TNA TPN day#: 4  Plan:  - Continue Clinimix E5/20 at 65 ml/hr -- goal. - Provide lipids at 10 ml/hr, MVI, TE MWF only 2/2 national backorder - Repeat K-runs x 4 - Continue NS at [facility] - d/c CBG/SSI - Continue standing TPN labs, f/u am bmp - f/u PO progress, hopefully can d/c TNA soon  Naithen Rivenburg L. Illene Bolus, PharmD, BCPS Clinical Pharmacist Pager: [phone] Pharmacy: [phone] 03/20/2012 7:56 AM   Addendum  -Asked to add zosyn for aspiration pna.  Will give Zosyn 3.375G IV q8h to be infused over 4 hours.  Follow up SCr, UOP, cultures, clinical course and adjust as clinically indicated.  - Also, upon rounding on patient, discovered that lipid bag was hanging at 14 ml/hr instead of the ordered 10  Ml/hr, causing it to finish sooner than anticipated and had another patients name on the label.  Product was the same as had been ordered for this patient.  Ensured RN and patient family that increased rate would be unlikely to have adverse ramifications for this patient.  Safety Zone write up completed.  Wyndham Santilli L. Illene Bolus, PharmD, BCPS Clinical Pharmacist Pager: [phone] Pharmacy: [phone] 03/20/2012 2:30 PM

## 2012-03-20 NOTE — Progress Notes (Signed)
Physical Therapy Treatment Patient Details Name: [name] MRN: [id number] DOB: 03/30/1925 Today's Date: 03/20/2012 Time: 2841-3244 PT Time Calculation (min): 28 min  PT Assessment / Plan / Recommendation Comments on Treatment Session  Pt continues to make progress towards PT goals.  Advised pt and family that patient needs to be OOB as much as possible. Encouraged ambulation and positioning changes.  Will continue to see and progress activity as tolerated.    Follow Up Recommendations  SNF           Equipment Recommendations  None recommended by PT    Recommendations for Other Services    Frequency Min 3X/week   Plan Discharge plan needs to be updated    Precautions / Restrictions Precautions Precautions: Fall;Other (comment) (colostomy) Precaution Comments: colostomy Restrictions Weight Bearing Restrictions: No   Pertinent Vitals/Pain 6/10    Mobility  Bed Mobility Bed Mobility: Sit to Supine;Scooting to HOB Supine to Sit: 4: Min assist;HOB elevated Sit to Supine: 4: Min assist Scooting to [facility]: 4: Min assist Details for Bed Mobility Assistance: Pt exhausted and needed assist to help position in bed Transfers Transfers: Sit to Stand;Stand to Sit Sit to Stand: 4: Min guard;With armrests;From chair/3-in-1 Stand to Sit: 4: Min assist;To bed Details for Transfer Assistance: VC's for handplacement and safety with verbal;teachback before performance Ambulation/Gait Ambulation/Gait Assistance: 4: Min guard Ambulation Distance (Feet): 160 Feet Assistive device: Rolling walker Ambulation/Gait Assistance Details: Max cues for encouragement Gait Pattern: Step-through pattern;Decreased stride length;Trunk flexed Gait velocity: decreased General Gait Details: pt more steady with ambulationl VC's still required for positioning inside rw     PT Goals Acute Rehab PT Goals PT Goal Formulation: With patient Time For Goal Achievement: 03/15/12 Potential to Achieve Goals:  Good Pt will go Supine/Side to Sit: Independently;with HOB 0 degrees PT Goal: Supine/Side to Sit - Progress: Progressing toward goal Pt will go Sit to Supine/Side: Independently;with HOB 0 degrees PT Goal: Sit to Supine/Side - Progress: Progressing toward goal Pt will go Sit to Stand: with modified independence;with upper extremity assist PT Goal: Sit to Stand - Progress: Progressing toward goal Pt will Ambulate: >150 feet;with modified independence;with rolling walker PT Goal: Ambulate - Progress: Progressing toward goal Pt will Go Up / Down Stairs: 1-2 stairs;with supervision;with least restrictive assistive device  Visit Information  Last PT Received On: 03/20/12 Assistance Needed: +1    Subjective Data  Subjective: I feel cruddyy   Cognition  Cognition Overall Cognitive Status: Appears within functional limits for tasks assessed/performed Arousal/Alertness: Awake/alert Orientation Level: Appears intact for tasks assessed Behavior During Session: [facility] for tasks performed    Balance  Balance Balance Assessed: Yes Dynamic Standing Balance Dynamic Standing - Balance Support: Right upper extremity supported;Left upper extremity supported Dynamic Standing - Level of Assistance: 4: Min assist  End of Session PT - End of Session Equipment Utilized During Treatment: Gait belt Activity Tolerance: Patient limited by fatigue;Patient limited by pain Patient left: in bed;with call bell/phone within reach;with family/visitor present Nurse Communication: Mobility status (pt has ambulated 2x will need to ambulate with NSG again)   GP     [name] 03/20/2012, 4:15 PM [name], PT DPT  [phone]

## 2012-03-20 NOTE — Progress Notes (Signed)
Occupational Therapy Treatment Patient Details Name: [name] MRN: [id number] DOB: 10-20-1925 Today's Date: 03/20/2012 Time: 3086-5784 OT Time Calculation (min): 42 min  OT Assessment / Plan / Recommendation Comments on Treatment Session Pt making progress but at a slow rate.  Still with significant pain and limited endurance for ADLs.  Needs continued rehab at SNF level.    Follow Up Recommendations  SNF    Barriers to Discharge       Equipment Recommendations  Tub/shower bench    Recommendations for Other Services    Frequency Min 2X/week   Plan Discharge plan needs to be updated    Precautions / Restrictions Precautions Precautions: Fall;Other (comment) Precaution Comments: colostomy Restrictions Weight Bearing Restrictions: No   Pertinent Vitals/Pain O2 88% on room air with activity, increasing to 95% on 1L nasal cannula    ADL  Lower Body Dressing: Performed;Min guard (practiced with reacher and sockaide with socks) Where Assessed - Lower Body Dressing: Unsupported sitting Toilet Transfer: Simulated;Minimal assistance Toilet Transfer Method: Other (comment) (ambulate with RW) Toilet Transfer Equipment: Other (comment) (bedside chair, pt with colostomy and now cathetor) Toileting - Clothing Manipulation and Hygiene: Simulated;Minimal assistance Where Assessed - Engineer, mining and Hygiene: Other (comment) (sit to stand from bedside chair) Transfers/Ambulation Related to ADLs: Pt min assist level for transfers and mobility using the RW. ADL Comments: Pt still with abdominal pain.  Worked on using AE for LB selfcare secondary to increased pain in her abdomen.  Pt with increased SOB with minimal activity.  O2 sats decreasing to 88% on room air and 95% on 1liter nasal cannula.  Daughter present during treatment and discussed use of tub bench at home after rehab if needed and AE availability for LB selfcare.      OT Goals Acute Rehab OT Goals OT Goal  Formulation: With patient ADL Goals Pt Will Perform Lower Body Bathing: with supervision;Sit to stand from bed;with adaptive equipment ADL Goal: Lower Body Bathing - Progress: Goal set today ADL Goal: Toilet Transfer - Progress: Progressing toward goals ADL Goal: Toileting - Clothing Manipulation - Progress: Progressing toward goals ADL Goal: Toileting - Hygiene - Progress: Progressing toward goals ADL Goal: Additional Goal #1 - Progress: Progressing toward goals  Visit Information  Last OT Received On: 03/20/12 Assistance Needed: +1    Subjective Data  Subjective: I just get so tired so easily. Patient Stated Goal: To get her stamina and strength.      Cognition  Cognition Overall Cognitive Status: Appears within functional limits for tasks assessed/performed Arousal/Alertness: Awake/alert Orientation Level: Appears intact for tasks assessed Behavior During Session: [facility] for tasks performed    Mobility  Bed Mobility Bed Mobility: Supine to Sit Supine to Sit: 4: Min assist;HOB elevated Details for Bed Mobility Assistance: Pt needing assistance to begin transition from supine to sit with her trunk. Transfers Transfers: Sit to Stand;Stand to Sit Sit to Stand: 4: Min assist;With upper extremity assist;With armrests;From bed Stand to Sit: 4: Min assist;With armrests;To chair/3-in-1 Details for Transfer Assistance: Mod instructional cueing needed for hand placement with sit to stand and stand to sit transtions.       Balance Balance Balance Assessed: Yes Dynamic Standing Balance Dynamic Standing - Balance Support: Right upper extremity supported;Left upper extremity supported Dynamic Standing - Level of Assistance: 4: Min assist   End of Session OT - End of Session Equipment Utilized During Treatment: Gait belt Activity Tolerance: Patient limited by fatigue;Patient limited by pain Patient left: in chair;with call  bell/phone within reach;with family/visitor present Nurse  Communication: Mobility status    Mykle Pascua OTR/L Pager number [phone] 03/20/2012, 1:49 PM

## 2012-03-20 NOTE — Progress Notes (Signed)
Patient ID: [name]  female  WUJ:[id number]    DOB: 01/08/1925    DOA: 03/07/2012  PCP: Kaleen Mask, MD  Assessment/Plan:  Recurrent acute sigmoid diverticulitis with intra-abdominal abscess:  post-op ileus resolving, passing stool in colostomy bag.   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  - placed on clears, on TPN     Hypertension: cont lisinopril   History of Chronic diastolic CHF (congestive heart failure): fluid overloaded now after starting TPN, had been on IVF -  EF 55-60% based on 2D Echo on 02/25/2009 - BNP 3342, CXR, cont IV lasix, nebs, O2 - also question of aspiration PNA on CXR, placed on IV zosyn, will repeat another CXR tomorrow    H/O: hypothyroidism - Stable, last TSH 1/14 normal, the patient is not on any replacement  History of COPD  - placed on alb and atrovent nebs  DVT Prophylaxis: lovenox  Code Status:  Disposition: declined by in-patient rehab   Subjective: No nausea, vomiting, started passing stool in her bag. Still SOB.   Objective: Weight change:   Intake/Output Summary (Last 24 hours) at 03/20/12 0921 Last data filed at 03/20/12 0550  Gross per 24 hour  Intake 1417.58 ml  Output    125 ml  Net 1292.58 ml   Blood pressure 149/72, pulse 79, temperature 98.4 F (36.9 C), temperature source Oral, resp. rate 18, height 5\' 1"  (1.549 m), weight 65.726 kg (144 lb 14.4 oz), SpO2 96.00%.  Physical Exam: General: Ax O, NAD  CVS: S1-S2 clear Chest: Decreased breath sound at the bases, no active rhonchi or wheezing Abdomen:  Dressing intact, colostomy with output Extremities: no c/c/e   Lab Results: Basic Metabolic Panel:  Recent Labs Lab 03/19/12 0541 03/20/12 0540  NA 143 141  K 3.7 3.6  CL 107 102  CO2 30 33*  GLUCOSE 144* 142*  BUN 18 20  CREATININE 0.46* 0.42*  CALCIUM 8.4 8.4  MG  --  1.9  PHOS  --  3.2   Liver Function Tests:  Recent Labs Lab 03/18/12 0557 03/20/12 0540  AST  10 16  ALT 8 9  ALKPHOS 54 57  BILITOT 0.3 0.4  PROT 5.3* 5.3*  ALBUMIN 2.3* 2.3*   CBC:  Recent Labs Lab 03/17/12 0705 03/18/12 0557  WBC 7.9 10.9*  NEUTROABS  --  9.2*  HGB 12.8 12.4  HCT 38.9 37.8  MCV 86.4 88.1  PLT 364 391   Cardiac Enzymes: No results found for this basename: CKTOTAL, CKMB, CKMBINDEX, TROPONINI,  in the last 168 hours BNP: No components found with this basename: POCBNP,  CBG:  Recent Labs Lab 03/19/12 0630 03/19/12 1146 03/19/12 1801 03/19/12 2344 03/20/12 0552  GLUCAP 143* 128* 120* 137* 147*     Micro Results: Recent Results (from the past 240 hour(s))  SURGICAL PCR SCREEN     Status: None   Collection Time    03/11/12  9:28 PM      Result Value Range Status   MRSA, PCR NEGATIVE  NEGATIVE Final   Staphylococcus aureus NEGATIVE  NEGATIVE Final   Comment:            The Xpert SA Assay (FDA     approved for NASAL specimens     in patients over 72 years of age),     is one component of     a comprehensive surveillance     program.  Test performance has     been  validated by [facility] for patients greater     than or equal to 5 year old.     It is not intended     to diagnose infection nor to     guide or monitor treatment.    Studies/Results: Ct Abdomen Pelvis W Contrast  03/07/2012  *RADIOLOGY REPORT*  Clinical Data: Lower abdominal pain, history diverticulitis, CHF, COPD  CT ABDOMEN AND PELVIS WITH CONTRAST  Technique:  Multidetector CT imaging of the abdomen and pelvis was performed following the standard protocol during bolus administration of intravenous contrast. Sagittal and coronal MPR images reconstructed from axial data set.  Contrast: 80mL OMNIPAQUE IOHEXOL 300 MG/ML  SOLN Dilute oral contrast.  Comparison: 02/07/2012  Findings: Minimal atelectasis posterior right lower lobe base. Improved atelectasis left lower lobe. Mitral annular calcification noted. Bilateral renal cysts. Liver, spleen, pancreas, with kidneys, and  adrenal glands otherwise normal appearance.  Stomach incompletely distended, suboptimal assessment wall thickness. Small umbilical hernia containing fat. Diverticulosis of descending and sigmoid colon. Wall thickening identified at mid sigmoid colon with improved pericolic inflammatory changes compatible with acute diverticulitis. Extraluminal gas collections medial to the thickened sigmoid segment again seen compatible with contained perforation within the mesocolon. The presence of minimal high attenuation within this collection raises the possibility that this collection demonstrates continued communication with the adjacent sigmoid colon lumen.  No discrete drainable fluid/abscess collection identified. No free intraperitoneal air. Small bowel loops and remainder colon showed no additional acute changes. Scattered atherosclerotic calcifications. No mass, adenopathy, free to free of fluid, or hernia. Bones diffusely demineralized.  IMPRESSION: Persistent acute diverticulitis changes of the sigmoid colon with wall thickening and further improved pericolic infiltrative changes. Persistent visualization of extraluminal gas collections within the sigmoid mesocolon at site of prior diverticular abscess, containing a small amount of central high attenuation which could represent GI contrast and indicate continued communication of this collection with the sigmoid lumen. Diverticulosis of sigmoid and descending colon.   Original Report Authenticated By: Ulyses Southward, M.D.     Medications: Scheduled Meds: . albuterol  2.5 mg Nebulization TID  . antiseptic oral rinse  15 mL Mouth Rinse q12n4p  . chlorhexidine  15 mL Mouth Rinse BID  . enoxaparin (LOVENOX) injection  40 mg Subcutaneous Q24H  . feeding supplement  30 mL Oral QAC breakfast  . feeding supplement  1 Container Oral TID BM  . furosemide  40 mg Intravenous Daily  . ipratropium  0.5 mg Nebulization TID  . lisinopril  20 mg Oral Daily  .  piperacillin-tazobactam (ZOSYN)  IV  3.375 g Intravenous Q8H  . polyethylene glycol  17 g Oral Daily  . potassium chloride  10 mEq Intravenous Q1 Hr x 4      LOS: 13 days   RAI,RIPUDEEP M.D. [facility] 03/20/2012, 9:21 AM Pager: [phone]  If 7PM-7AM, please contact night-coverage [email or web address] Password [email or web address]

## 2012-03-20 NOTE — Clinical Social Work Note (Signed)
Clinical Social Worker followed up with patient and family, that was at bedside regarding bed offers received. Patient and family still prefer [facility], but they are considering. CSW will continue to follow and update patient and family when more bed offers are made.   Rozetta Nunnery MSW, [employer] [phone]

## 2012-03-20 NOTE — Consult Note (Signed)
WOC ostomy consult  Stoma type/location: Colostomy stoma to left lower quad. Stomal assessment/size: Stoma red and viable, flush with skin level. Peristomal assessment: Intact skin surrounding. Output  50cc liquid brown stool Ostomy pouching: 1pc with barrier ring.  Education provided: Daughter at bedside for pouch change demonstration.  Pt able to apply pouch with barrier ring if opening has been cut to fit ahead of time.  Able to open and close velcro to open.  Supplies at bedside for staff use.  Reviewed pouching routines and ordering supplies with daughter at bedside.  Pt has been placed on [facility] discharge program and will need total pouching assistance until she is strong enough to be independent. Cammie Mcgee MSN, RN, CWOCN, [place], CNS [phone]

## 2012-03-21 ENCOUNTER — Inpatient Hospital Stay (HOSPITAL_COMMUNITY): Payer: Medicare Other

## 2012-03-21 LAB — BASIC METABOLIC PANEL
BUN: 24 mg/dL — ABNORMAL HIGH (ref 6–23)
Calcium: 8.6 mg/dL (ref 8.4–10.5)
Chloride: 97 mEq/L (ref 96–112)
Creatinine, Ser: 0.59 mg/dL (ref 0.50–1.10)
GFR calc Af Amer: >90 mL/min (ref >90)
GFR calc non Af Amer: 82 mL/min — ABNORMAL LOW (ref >90)

## 2012-03-21 LAB — GLUCOSE, CAPILLARY: Glucose-Capillary: 131 mg/dL — ABNORMAL HIGH (ref 70–99)

## 2012-03-21 MED ORDER — FAT EMULSION 20 % IV EMUL
250.0000 mL | INTRAVENOUS | Status: AC
  Administered 2012-03-21: 250 mL via INTRAVENOUS
  Filled 2012-03-21: qty 250

## 2012-03-21 MED ORDER — POTASSIUM CHLORIDE 10 MEQ/50ML IV SOLN
10.0000 meq | INTRAVENOUS | Status: AC
  Administered 2012-03-21 (×2): 10 meq via INTRAVENOUS
  Filled 2012-03-21 (×2): qty 50

## 2012-03-21 MED ORDER — TRACE MINERALS CR-CU-F-FE-I-MN-MO-SE-ZN IV SOLN
INTRAVENOUS | Status: AC
  Administered 2012-03-21: 17:00:00 via INTRAVENOUS
  Filled 2012-03-21: qty 2000

## 2012-03-21 MED ORDER — ACETAMINOPHEN 500 MG PO TABS
500.0000 mg | ORAL_TABLET | Freq: Three times a day (TID) | ORAL | Status: DC
  Administered 2012-03-21 – 2012-03-31 (×26): 500 mg via ORAL
  Filled 2012-03-21 (×35): qty 1

## 2012-03-21 MED ORDER — CYCLOBENZAPRINE HCL 5 MG PO TABS
5.0000 mg | ORAL_TABLET | Freq: Two times a day (BID) | ORAL | Status: DC
  Administered 2012-03-21 – 2012-03-23 (×5): 5 mg via ORAL
  Filled 2012-03-21 (×6): qty 1

## 2012-03-21 NOTE — Progress Notes (Signed)
Patient still being considered by [facility] for ?SNF- requested information related to her colostomy- [name] for [facility]- Reece Levy, MSW, Theresia Majors [phone]

## 2012-03-21 NOTE — Progress Notes (Signed)
PARENTERAL NUTRITION CONSULT NOTE  Pharmacy Consult for TPN Indication: Prolonged ileus  Subjective:  Patient reports her appetite is a little better.  She does not like the food choices on her tray.  Allergies  Allergen Reactions  . Indomethacin Anaphylaxis and Other (See Comments)    "took it fine for awhile; one day I stopped breathing and I ended up in the hospital" (01/02/2012)    Patient Measurements: Height: 5\' 1"  (154.9 cm) Weight: 144 lb 14.4 oz (65.726 kg) IBW/kg (Calculated) : 47.8 Adjusted Body Weight: 53.2 Usual Weight: 65.7  Vital Signs: Temp: 98.6 F (37 C) (03/21 0606) Temp src: Oral (03/21 0606) BP: 110/56 mmHg (03/21 0606) Pulse Rate: 78 (03/21 0606) Intake/Output from previous day: 03/20 0701 - 03/21 0700 In: 1631.4 [TPN:1631.4] Out: 2800 [Urine:2800] Intake/Output from this shift:    Labs: No results found for this basename: WBC, HGB, HCT, PLT, APTT, INR,  in the last 72 hours   Recent Labs  03/19/12 0541 03/20/12 0540 03/21/12 0500  NA 143 141 138  K 3.7 3.6 3.9  CL 107 102 97  CO2 30 33* 35*  GLUCOSE 144* 142* 142*  BUN 18 20 24*  CREATININE 0.46* 0.42* 0.59  CALCIUM 8.4 8.4 8.6  MG  --  1.9  --   PHOS  --  3.2  --   PROT  --  5.3*  --   ALBUMIN  --  2.3*  --   AST  --  16  --   ALT  --  9  --   ALKPHOS  --  57  --   BILITOT  --  0.4  --    Estimated Creatinine Clearance: 45.5 ml/min (by C-G formula based on Cr of 0.59).    Recent Labs  03/20/12 1152 03/20/12 1829 03/21/12 0017  GLUCAP 134* 143* 121*    Medical History: Past Medical History  Diagnosis Date  . Hypertension   . Goiter     radioactive iodine ablation/notes 01/02/2012  . COPD (chronic obstructive pulmonary disease)   . CHF (congestive heart failure)   . Pneumonia ?01/02/2012; ? 2009  . SOB (shortness of breath)     'all the time" (01/02/2012)  . Hypothyroidism     "I have taken Synthroid before" (01/02/2012)  . Arthritis     "right shoulder" (01/02/2012)  .  Diverticulitis     Medications:  Scheduled:  . acetaminophen  500 mg Oral TID  . albuterol  2.5 mg Nebulization TID  . antiseptic oral rinse  15 mL Mouth Rinse q12n4p  . chlorhexidine  15 mL Mouth Rinse BID  . cyclobenzaprine  5 mg Oral BID  . enoxaparin (LOVENOX) injection  40 mg Subcutaneous Q24H  . feeding supplement  237 mL Oral TID WC  . feeding supplement  30 mL Oral QAC breakfast  . feeding supplement  1 Container Oral TID BM  . furosemide  40 mg Intravenous Daily  . [COMPLETED] furosemide  40 mg Intravenous Once  . ipratropium  0.5 mg Nebulization TID  . lisinopril  20 mg Oral Daily  . piperacillin-tazobactam (ZOSYN)  IV  3.375 g Intravenous Q8H  . polyethylene glycol  17 g Oral Daily  . [COMPLETED] potassium chloride  10 mEq Intravenous Q1 Hr x 4    Insulin Requirements in the past 24 hours: SSI discontinued  Current Nutrition:  Full liquids; Clinimix E5/20 at 65 ml/hr (goal, provides 78g protein per day and an average of ~1580 kcal/day)  Nutritional Goals:  1640-1820 kCal, 70-80 grams of protein per day  Assessment: Admitted 3/7 with abdominal pain x 1 week.  She has lost 30lb in last 15 months due to abdominal pain.  Now s/p sigmoid resection, Hartman's pouch, & end colostomy for smoldering diverticulitis; prolonged ileus.    GI:  POD#9, prolonged ileus - now resolving per MD note.  Diet advanced 3/20 to full liquids but with minimal PO intake due to appetite and dislike of food selections.  Now passing stool into her ostomy.  Endo:  No hx DM.  CBGs have been well-controlled and her SSI coverage was discontinued on 3/20.  Hx goiter, radioiodine tx , on no thyroid replacement pta.    Lytes:  K remains low normal at 3.9 despite repletion x 3 days.  Goal K > 4 and Mg > 2 with ileus.  IVF: NS at [facility].  Renal:  Cr slightly up to 0.59  Pulm:  Hx COPD, not wheezing.  1L [place]  Cards:  Hx HTN, CHF.  VSS.  Lisinopril, Lasix 40mg  IV qday  Hepatobil:   LFTs wnl on  3/20  Neuro: No major issues.  Vicodin for pain control.  ID:   Aspiration PNA - Day #2 Zosyn.  AFeb, last WBC 10.9; Invanz 3/7>3/18:  Diverticulitis & abscess.  Best Practices:  Lovenox VTE px TPN Access:  PICC for TNA TPN day#: 5  Plan:  Continue Clinimix E 5/20 at 65 ml/hr. Provide lipids, MVI, and trace elements on Mon-Wed-Fri only due to national backorder. Give KCl runs x 2 today Continue NS at [facility] and Magnesium with AM labs Follow-up tolerance and advancement of diet for potential discontinuation of TPN.   Encouraged patient to speak with her dietary ambassador regarding available choices for her full liquid meal trays.  Estella Husk, Pharm.D., BCPS Clinical Pharmacist Phone: [phone] or [phone] Pager: [phone] 03/21/2012, 10:05 AM

## 2012-03-21 NOTE — Progress Notes (Signed)
9 Days Post-Op  Subjective: Feels bloated and c/o back pain  Objective: Vital signs in last 24 hours: Temp:  [98.1 F (36.7 C)-98.6 F (37 C)] 98.6 F (37 C) (03/21 0606) Pulse Rate:  [75-79] 78 (03/21 0606) Resp:  [17-20] 18 (03/21 0606) BP: (110-131)/(51-72) 110/56 mmHg (03/21 0606) SpO2:  [95 %-98 %] 98 % (03/21 0845) Last BM Date: 03/20/12  Intake/Output from previous day: 03/20 0701 - 03/21 0700 In: 1631.4 [ZOX:0960.4] Out: 2800 [Urine:2800] Intake/Output this shift:    General appearance: alert, cooperative and no distress GI: soft, mild distension, she does have a little tympany in the upper abdomen as well, her incision looks good and her ostomy is functioning well, she really doesnt have any tenderness  Lab Results:  No results found for this basename: WBC, HGB, HCT, PLT,  in the last 72 hours BMET  Recent Labs  03/20/12 0540 03/21/12 0500  NA 141 138  K 3.6 3.9  CL 102 97  CO2 33* 35*  GLUCOSE 142* 142*  BUN 20 24*  CREATININE 0.42* 0.59  CALCIUM 8.4 8.6   PT/INR No results found for this basename: LABPROT, INR,  in the last 72 hours ABG No results found for this basename: PHART, PCO2, PO2, HCO3,  in the last 72 hours  Studies/Results: Dg Chest 2 View  03/21/2012  *RADIOLOGY REPORT*  Clinical Data: 79 year old female with shortness of breath. 03/19/2012 and earlier.  CHEST - 2 VIEW  Comparison:  03/19/2012 and earlier.  Findings: Semi upright AP and lateral views of the chest.  Stable right PICC line.  Small to moderate bilateral layering pleural effusions.  No pneumothorax or pulmonary edema. Stable cardiomegaly and mediastinal contours.  There is streaky right infrahilar opacity, increased since 02/10/2012.  No other confluent pulmonary opacity.  Bulky chronic calcification of the thoracic inlet could be related to lymph nodes or thyroid nodules, not significantly changed since 2009. Stable visualized osseous structures.    IMPRESSION: 1.  Streaky right  lower lobe opacities suspicious for pneumonia. 2.  Small to moderate layering bilateral pleural effusions. 3.  Stable right PICC line.   Original Report Authenticated By: Erskine Speed, M.D.    Dg Chest Port 1 View  03/19/2012  *RADIOLOGY REPORT*  Clinical Data: .  PORTABLE CHEST - 1 VIEW  Comparison: PA and lateral chest 02/10/2012.  Findings: The patient has a new right PICC with the tip projecting at the superior cavoatrial junction.  There is new extensive right basilar airspace disease.  Milder degree of airspace opacity is seen in the left lung base.  Cardiomegaly is noted.  No pneumothorax.  IMPRESSION:  1.  Tip of right PICC projects over the superior cavoatrial junction. 2.  Right much worse than left basilar airspace disease could be due to asymmetric edema.  Pneumonia or aspiration in the right base could create a similar appearance.   Original Report Authenticated By: Holley Dexter, M.D.     Anti-infectives: Anti-infectives   Start     Dose/Rate Route Frequency Ordered Stop   03/20/12 0900  piperacillin-tazobactam (ZOSYN) IVPB 3.375 g     3.375 g 12.5 mL/hr over 240 Minutes Intravenous 3 times per day 03/20/12 0839     03/08/12 1900  ertapenem (INVANZ) 1 g in sodium chloride 0.9 % 50 mL IVPB  Status:  Discontinued     1 g 100 mL/hr over 30 Minutes Intravenous Every 24 hours 03/08/12 1408 03/19/12 1106   03/07/12 1800  ertapenem (INVANZ) 1 g  in sodium chloride 0.9 % 50 mL IVPB     1 g 100 mL/hr over 30 Minutes Intravenous To Emergency Dept 03/07/12 1721 03/07/12 1955   03/07/12 1730  ertapenem (INVANZ) 1 g in sodium chloride 0.9 % 50 mL IVPB  Status:  Discontinued     1 g 100 mL/hr over 30 Minutes Intravenous  Once 03/07/12 1716 03/07/12 1717      Assessment/Plan: s/p Procedure(s): COLON RESECTION SIGMOID  (N/A) COLOSTOMY (N/A) I think that overall she is improving but she does feel a little bloated today and has some abdominal tympany.  Consider abdominal xrays but  regardless, she is not eating much.  she is not nauseated or vomiting and no evidence of gastric distension on cxr this am.  I would just continue slowly with her and consider xrays of her abdomen.   LOS: 14 days    Lodema Pilot DAVID 03/21/2012

## 2012-03-21 NOTE — Progress Notes (Signed)
Patient ID: LLEWELLYN SCHOENBERGER  female  NWG:[id number]    DOB: [birth year]    DOA: 03/07/2012  PCP: Kaleen Mask, MD  Assessment/Plan:  Recurrent acute sigmoid diverticulitis with intra-abdominal abscess:  post-op ileus, passing stool in colostomy bag.  Feels bloated today 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  - placed on clears, on TPN  - [facility] closely following  Low back pain: Obtained x-ray of the lower spine doesn't show any acute spinal abnormality however did show small bowel obstruction (? Cause of bloating), [facility] following    Hypertension: cont lisinopril   History of Chronic diastolic CHF (congestive heart failure): fluid overloaded now after starting TPN, had been on IVF BNP 3342 -  EF 55-60% based on 2D Echo on 02/25/2009 -  cont IV lasix, nebs, O2 - Repeat chest x-ray done today showed right lobe pneumonia, likely aspiration, continue IV Zosyn  - Bilateral pleural effusion likely secondary to volume overload, -1 L in the last 24 hour shift however still net 9L positive since admission     H/O: hypothyroidism - Stable, last TSH 1/14 normal, the patient is not on any replacement  History of COPD  - placed on alb and atrovent nebs  DVT Prophylaxis: lovenox  Code Status:  Disposition: declined by in-patient rehab   Subjective: States shortness of breath has started improving but today feels bloated with low back pain  Objective: Weight change:   Intake/Output Summary (Last 24 hours) at 03/21/12 1327 Last data filed at 03/21/12 0559  Gross per 24 hour  Intake 1569.75 ml  Output   2800 ml  Net -1230.25 ml   Blood pressure 110/56, pulse 78, temperature 98.6 F (37 C), temperature source Oral, resp. rate 18, height 5\' 1"  (1.549 m), weight 65.726 kg (144 lb 14.4 oz), SpO2 98.00%.  Physical Exam: General: Ax O, NAD , CVS: S1-S2 clear Chest: Decreased breath sound at the bases, no active rhonchi or wheezing Abdomen:  Dressing intact,  colostomy with output Extremities: no c/c/e   Lab Results: Basic Metabolic Panel:  Recent Labs Lab 03/20/12 0540 03/21/12 0500  NA 141 138  K 3.6 3.9  CL 102 97  CO2 33* 35*  GLUCOSE 142* 142*  BUN 20 24*  CREATININE 0.42* 0.59  CALCIUM 8.4 8.6  MG 1.9  --   PHOS 3.2  --    Liver Function Tests:  Recent Labs Lab 03/18/12 0557 03/20/12 0540  AST 10 16  ALT 8 9  ALKPHOS 54 57  BILITOT 0.3 0.4  PROT 5.3* 5.3*  ALBUMIN 2.3* 2.3*   CBC:  Recent Labs Lab 03/17/12 0705 03/18/12 0557  WBC 7.9 10.9*  NEUTROABS  --  9.2*  HGB 12.8 12.4  HCT 38.9 37.8  MCV 86.4 88.1  PLT 364 391   Cardiac Enzymes: No results found for this basename: CKTOTAL, CKMB, CKMBINDEX, TROPONINI,  in the last 168 hours BNP: No components found with this basename: POCBNP,  CBG:  Recent Labs Lab 03/20/12 0552 03/20/12 1152 03/20/12 1829 03/21/12 0017 03/21/12 1154  GLUCAP 147* 134* 143* 121* 133*     Micro Results: Recent Results (from the past 240 hour(s))  SURGICAL PCR SCREEN     Status: None   Collection Time    03/11/12  9:28 PM      Result Value Range Status   MRSA, PCR NEGATIVE  NEGATIVE Final   Staphylococcus aureus NEGATIVE  NEGATIVE Final   Comment:  The Xpert SA Assay (FDA     approved for NASAL specimens     in patients over 55 years of age),     is one component of     a comprehensive surveillance     program.  Test performance has     been validated by [employer] for patients greater     than or equal to 43 year old.     It is not intended     to diagnose infection nor to     guide or monitor treatment.  URINE CULTURE     Status: None   Collection Time    03/19/12 10:21 AM      Result Value Range Status   Specimen Description URINE, CLEAN CATCH   Final   Special Requests NONE   Final   Culture  Setup Time 03/19/2012 12:09   Final   Colony Count NO GROWTH   Final   Culture NO GROWTH   Final   Report Status 03/20/2012 FINAL   Final     Studies/Results: Ct Abdomen Pelvis W Contrast  03/07/2012  *RADIOLOGY REPORT*  Clinical Data: Lower abdominal pain, history diverticulitis, CHF, COPD  CT ABDOMEN AND PELVIS WITH CONTRAST  Technique:  Multidetector CT imaging of the abdomen and pelvis was performed following the standard protocol during bolus administration of intravenous contrast. Sagittal and coronal MPR images reconstructed from axial data set.  Contrast: 80mL OMNIPAQUE IOHEXOL 300 MG/ML  SOLN Dilute oral contrast.  Comparison: 02/07/2012  Findings: Minimal atelectasis posterior right lower lobe base. Improved atelectasis left lower lobe. Mitral annular calcification noted. Bilateral renal cysts. Liver, spleen, pancreas, with kidneys, and adrenal glands otherwise normal appearance.  Stomach incompletely distended, suboptimal assessment wall thickness. Small umbilical hernia containing fat. Diverticulosis of descending and sigmoid colon. Wall thickening identified at mid sigmoid colon with improved pericolic inflammatory changes compatible with acute diverticulitis. Extraluminal gas collections medial to the thickened sigmoid segment again seen compatible with contained perforation within the mesocolon. The presence of minimal high attenuation within this collection raises the possibility that this collection demonstrates continued communication with the adjacent sigmoid colon lumen.  No discrete drainable fluid/abscess collection identified. No free intraperitoneal air. Small bowel loops and remainder colon showed no additional acute changes. Scattered atherosclerotic calcifications. No mass, adenopathy, free to free of fluid, or hernia. Bones diffusely demineralized.  IMPRESSION: Persistent acute diverticulitis changes of the sigmoid colon with wall thickening and further improved pericolic infiltrative changes. Persistent visualization of extraluminal gas collections within the sigmoid mesocolon at site of prior diverticular abscess,  containing a small amount of central high attenuation which could represent GI contrast and indicate continued communication of this collection with the sigmoid lumen. Diverticulosis of sigmoid and descending colon.   Original Report Authenticated By: Ulyses Southward, M.D.     Medications: Scheduled Meds: . acetaminophen  500 mg Oral TID  . albuterol  2.5 mg Nebulization TID  . antiseptic oral rinse  15 mL Mouth Rinse q12n4p  . chlorhexidine  15 mL Mouth Rinse BID  . cyclobenzaprine  5 mg Oral BID  . enoxaparin (LOVENOX) injection  40 mg Subcutaneous Q24H  . feeding supplement  237 mL Oral TID WC  . feeding supplement  30 mL Oral QAC breakfast  . feeding supplement  1 Container Oral TID BM  . furosemide  40 mg Intravenous Daily  . ipratropium  0.5 mg Nebulization TID  . lisinopril  20 mg Oral Daily  .  piperacillin-tazobactam (ZOSYN)  IV  3.375 g Intravenous Q8H  . polyethylene glycol  17 g Oral Daily  . potassium chloride  10 mEq Intravenous Q1 Hr x 2      LOS: 14 days   Glorine Hanratty M.D. [facility] 03/21/2012, 1:27 PM Pager: [phone]  If 7PM-7AM, please contact night-coverage [email or web address] Password [email or web address]

## 2012-03-22 ENCOUNTER — Inpatient Hospital Stay (HOSPITAL_COMMUNITY): Payer: Medicare Other

## 2012-03-22 LAB — BASIC METABOLIC PANEL
BUN: 22 mg/dL (ref 6–23)
CO2: 34 mEq/L — ABNORMAL HIGH (ref 19–32)
Chloride: 97 mEq/L (ref 96–112)
GFR calc non Af Amer: 81 mL/min — ABNORMAL LOW (ref >90)
Glucose, Bld: 122 mg/dL — ABNORMAL HIGH (ref 70–99)
Potassium: 4 mEq/L (ref 3.5–5.1)
Sodium: 139 mEq/L (ref 135–145)

## 2012-03-22 LAB — CBC
HCT: 33.9 % — ABNORMAL LOW (ref 36.0–46.0)
MCHC: 33.6 g/dL (ref 30.0–36.0)
RDW: 14.9 % (ref 11.5–15.5)

## 2012-03-22 LAB — GLUCOSE, CAPILLARY
Glucose-Capillary: 135 mg/dL — ABNORMAL HIGH (ref 70–99)
Glucose-Capillary: 144 mg/dL — ABNORMAL HIGH (ref 70–99)

## 2012-03-22 MED ORDER — CLINIMIX E/DEXTROSE (5/20) 5 % IV SOLN
INTRAVENOUS | Status: AC
  Administered 2012-03-22: 17:00:00 via INTRAVENOUS
  Filled 2012-03-22: qty 2000

## 2012-03-22 MED ORDER — ALBUTEROL SULFATE (5 MG/ML) 0.5% IN NEBU
2.5000 mg | INHALATION_SOLUTION | Freq: Four times a day (QID) | RESPIRATORY_TRACT | Status: DC | PRN
  Administered 2012-03-23: 2.5 mg via RESPIRATORY_TRACT
  Filled 2012-03-22: qty 0.5

## 2012-03-22 MED ORDER — IPRATROPIUM-ALBUTEROL 20-100 MCG/ACT IN AERS: 2.0000 | INHALATION_SPRAY | Freq: Four times a day (QID) | RESPIRATORY_TRACT | Status: DC

## 2012-03-22 MED ORDER — FUROSEMIDE 10 MG/ML IJ SOLN
20.0000 mg | Freq: Once | INTRAMUSCULAR | Status: AC
  Administered 2012-03-22: 20 mg via INTRAVENOUS
  Filled 2012-03-22: qty 2

## 2012-03-22 MED ORDER — IPRATROPIUM-ALBUTEROL 20-100 MCG/ACT IN AERS
2.0000 | INHALATION_SPRAY | Freq: Four times a day (QID) | RESPIRATORY_TRACT | Status: DC | PRN
  Filled 2012-03-22: qty 4

## 2012-03-22 NOTE — Progress Notes (Signed)
10 Days Post-Op  Subjective: Says that she is feeling better  Objective: Vital signs in last 24 hours: Temp:  [98 F (36.7 C)-98.3 F (36.8 C)] 98.1 F (36.7 C) (03/22 0635) Pulse Rate:  [65-82] 68 (03/22 0635) Resp:  [16-18] 16 (03/22 0635) BP: (111-127)/(58-88) 127/88 mmHg (03/22 0635) SpO2:  [95 %-99 %] 97 % (03/22 0635) Last BM Date: 03/22/12  Intake/Output from previous day: 03/21 0701 - 03/22 0700 In: 2095 [P.O.:120; I.V.:320; IV Piggyback:100; TPN:1555] Out: 3100 [Urine:1300; Stool:1800] Intake/Output this shift:    General appearance: alert, cooperative and no distress Resp: winded with speech but otherwise breathing comfortably GI: soft, no significant tenderness, still with some distension, ostomy looks fine and is working well  Lab Results:  No results found for this basename: WBC, HGB, HCT, PLT,  in the last 72 hours BMET  Recent Labs  03/21/12 0500 03/22/12 0540  NA 138 139  K 3.9 4.0  CL 97 97  CO2 35* 34*  GLUCOSE 142* 122*  BUN 24* 22  CREATININE 0.59 0.60  CALCIUM 8.6 9.0   PT/INR No results found for this basename: LABPROT, INR,  in the last 72 hours ABG No results found for this basename: PHART, PCO2, PO2, HCO3,  in the last 72 hours  Studies/Results: Dg Chest 2 View  03/21/2012  *RADIOLOGY REPORT*  Clinical Data: 79 year old female with shortness of breath. 03/19/2012 and earlier.  CHEST - 2 VIEW  Comparison:  03/19/2012 and earlier.  Findings: Semi upright AP and lateral views of the chest.  Stable right PICC line.  Small to moderate bilateral layering pleural effusions.  No pneumothorax or pulmonary edema. Stable cardiomegaly and mediastinal contours.  There is streaky right infrahilar opacity, increased since 02/10/2012.  No other confluent pulmonary opacity.  Bulky chronic calcification of the thoracic inlet could be related to lymph nodes or thyroid nodules, not significantly changed since 2009. Stable visualized osseous structures.     IMPRESSION: 1.  Streaky right lower lobe opacities suspicious for pneumonia. 2.  Small to moderate layering bilateral pleural effusions. 3.  Stable right PICC line.   Original Report Authenticated By: Erskine Speed, M.D.    Dg Lumbar Spine 2-3 Views  03/21/2012  *RADIOLOGY REPORT*  Clinical Data: 79 year old female with back pain.  No injury.  LUMBAR SPINE - 2-3 VIEW  Comparison: CT abdomen pelvis 03/07/2012 and earlier.  Findings: Osteopenia.  Normal lumbar segmentation.  Stable vertebral height and alignment since 03/07/2012.  Mild grade 1 anterolisthesis of L4 on L5.  Multilevel moderate to severe lumbar facet hypertrophy.  Grossly stable visible lower thoracic levels. Midline abdominal skin staples. Calcified atherosclerosis of the abdominal aorta and iliac arteries.  Multiple gas-filled small bowel loops measuring up to 50 mm diameter.  IMPRESSION: 1.  Stable lumbar vertebral height and alignment since 03/07/2012. 2.  Gas filled dilated small bowel loops up to 50 mm diameter suggestive of small bowel obstruction.   Original Report Authenticated By: Erskine Speed, M.D.     Anti-infectives: Anti-infectives   Start     Dose/Rate Route Frequency Ordered Stop   03/20/12 0900  piperacillin-tazobactam (ZOSYN) IVPB 3.375 g     3.375 g 12.5 mL/hr over 240 Minutes Intravenous 3 times per day 03/20/12 0839     03/08/12 1900  ertapenem (INVANZ) 1 g in sodium chloride 0.9 % 50 mL IVPB  Status:  Discontinued     1 g 100 mL/hr over 30 Minutes Intravenous Every 24 hours 03/08/12 1408 03/19/12 1106  03/07/12 1800  ertapenem (INVANZ) 1 g in sodium chloride 0.9 % 50 mL IVPB     1 g 100 mL/hr over 30 Minutes Intravenous To Emergency Dept 03/07/12 1721 03/07/12 1955   03/07/12 1730  ertapenem (INVANZ) 1 g in sodium chloride 0.9 % 50 mL IVPB  Status:  Discontinued     1 g 100 mL/hr over 30 Minutes Intravenous  Once 03/07/12 1716 03/07/12 1717      Assessment/Plan: s/p Procedure(s): COLON RESECTION SIGMOID   (N/A) COLOSTOMY (N/A) she is still bloated and had dilated and gas filled small bowel on lumbar xrays but she is doing okay with liquids and no nausea or vomiting.  because of her distension we are advancing very slowly her diet but her ostomy does seem to be working okay.  LOS: 15 days    Lodema Pilot DAVID 03/22/2012

## 2012-03-22 NOTE — Progress Notes (Signed)
**Note De-Identified  Obfuscation** RT protocol assessment note: patient has h/o COPD denies any SOB, BBS clear/mildly diminished. 97% on 1 L [place], VS WNL. CXR on 03/21/12: RLL opacities suspicious of PNA and sm. To med. Bilateral pleural effusions. Patient currently on Ventolin and Atrovent HHN TID, will discontinue treatments and place back to home regimen of combivent 2p Q6 PRN as tolerated.

## 2012-03-22 NOTE — Progress Notes (Addendum)
Patient ID: [name]  female  RUE:[id number]    DOB: [birth year]    DOA: 03/07/2012  PCP: Kaleen Mask, MD  Assessment/Plan:  Recurrent acute sigmoid diverticulitis with intra-abdominal abscess:  post-op ileus, passing stool in colostomy bag.  Feels bloated today 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  - placed on clears, on TPN  - [facility] closely following, D/W Dr Biagio Quint bedside 03-22-12, liquids continue and monitor.    Low back pain: Obtained x-ray of the lower spine doesn't show any acute spinal abnormality however did show small bowel obstruction (? Cause of bloating), [facility] following    Hypertension: cont lisinopril     History of Chronic diastolic CHF (congestive heart failure): fluid overloaded now after starting TPN, had been on IVF BNP 3342, Bilateral pleural effusion likely secondary to volume overload, -1 L in the last 24 hour shift however still net 9L positive since admission   -  EF 55-60% based on 2D Echo on 02/25/2009 -  cont IV lasix ( another extra dose 03-22-12), nebs, O2 Improving.     - Repeat chest x-ray done 03-21-12  showed right lobe pneumonia, likely aspiration, continue IV Zosyn , CBC in am, stable now.         H/O: hypothyroidism - Stable, last TSH 1/14 normal, the patient is not on any replacement    History of COPD  - placed on alb and atrovent nebs    I was paged on 03/22/2012 3 PM the patient is acting confused and delirious.  And to evaluate the patient bedside, no focal neurological deficits, no headache, she is mildly confused and delirious, discussed this with her son, according to the son she's been getting on and off confused for the last 3-4 days, explained to him that this is delirium, however for the sake of completeness we'll check a CT of the brain, will repeat CBC along with KUB and chest x-ray, continue supportive care. Fall and aspiration precautions. Will currently make her n.p.o. his chances  of respiration are high.     DVT Prophylaxis: lovenox  Code Status:  Disposition: declined by in-patient rehab   Subjective: States shortness of breath has started improving but today feels bloated with mild nausea, no back pain  Objective: Weight change:   Intake/Output Summary (Last 24 hours) at 03/22/12 1027 Last data filed at 03/22/12 0556  Gross per 24 hour  Intake   2095 ml  Output   3100 ml  Net  -1005 ml   Blood pressure 127/88, pulse 68, temperature 98.1 F (36.7 C), temperature source Oral, resp. rate 16, height 5\' 1"  (1.549 m), weight 65.726 kg (144 lb 14.4 oz), SpO2 97.00%.  Physical Exam: General: Ax O, NAD , CVS: S1-S2 clear Chest: Decreased breath sound at the bases, no active rhonchi or wheezing Abdomen:  Dressing intact, colostomy with output, mild ABD distension Extremities: no c/c/e   Lab Results: Basic Metabolic Panel:  Recent Labs Lab 03/20/12 0540 03/21/12 0500 03/22/12 0540  NA 141 138 139  K 3.6 3.9 4.0  CL 102 97 97  CO2 33* 35* 34*  GLUCOSE 142* 142* 122*  BUN 20 24* 22  CREATININE 0.42* 0.59 0.60  CALCIUM 8.4 8.6 9.0  MG 1.9  --  2.0  PHOS 3.2  --   --    Liver Function Tests:  Recent Labs Lab 03/18/12 0557 03/20/12 0540  AST 10 16  ALT 8 9  ALKPHOS 54 57  BILITOT  0.3 0.4  PROT 5.3* 5.3*  ALBUMIN 2.3* 2.3*   CBC:  Recent Labs Lab 03/17/12 0705 03/18/12 0557  WBC 7.9 10.9*  NEUTROABS  --  9.2*  HGB 12.8 12.4  HCT 38.9 37.8  MCV 86.4 88.1  PLT 364 391   Cardiac Enzymes: No results found for this basename: CKTOTAL, CKMB, CKMBINDEX, TROPONINI,  in the last 168 hours BNP: No components found with this basename: POCBNP,  CBG:  Recent Labs Lab 03/21/12 1154 03/21/12 1630 03/21/12 1937 03/22/12 0012 03/22/12 0756  GLUCAP 133* 122* 131* 132* 135*     Micro Results: Recent Results (from the past 240 hour(s))  URINE CULTURE     Status: None   Collection Time    03/19/12 10:21 AM      Result Value  Range Status   Specimen Description URINE, CLEAN CATCH   Final   Special Requests NONE   Final   Culture  Setup Time 03/19/2012 12:09   Final   Colony Count NO GROWTH   Final   Culture NO GROWTH   Final   Report Status 03/20/2012 FINAL   Final        Medications: Scheduled Meds: . acetaminophen  500 mg Oral TID  . antiseptic oral rinse  15 mL Mouth Rinse q12n4p  . chlorhexidine  15 mL Mouth Rinse BID  . cyclobenzaprine  5 mg Oral BID  . enoxaparin (LOVENOX) injection  40 mg Subcutaneous Q24H  . feeding supplement  237 mL Oral TID WC  . feeding supplement  30 mL Oral QAC breakfast  . feeding supplement  1 Container Oral TID BM  . furosemide  20 mg Intravenous Once  . furosemide  40 mg Intravenous Daily  . lisinopril  20 mg Oral Daily  . piperacillin-tazobactam (ZOSYN)  IV  3.375 g Intravenous Q8H  . polyethylene glycol  17 g Oral Daily      LOS: 15 days   Leroy Sea M.D. [facility] 03/22/2012, 10:27 AM Pager: [phone]  If 7PM-7AM, please contact night-coverage [email or web address] Password [email or web address]

## 2012-03-22 NOTE — Progress Notes (Signed)
Patient talking to herself and "people" in the room that are not there.  Patient's vital signs are stable and no new medications have been administered.  She is not taking any medicine for pain.  Called Dr. Thedore Mins who suggested CT head without contrast, stat CBC, and a sitter for safety for probable delirium.  Will continue to follow.  Roland Rack, RN

## 2012-03-22 NOTE — Progress Notes (Signed)
PARENTERAL NUTRITION CONSULT NOTE  Pharmacy Consult for TPN Indication: Prolonged ileus  Subjective:  Patient reports she was able to eat some grits and drink coffee this morning.     Allergies  Allergen Reactions  . Indomethacin Anaphylaxis and Other (See Comments)    "took it fine for awhile; one day I stopped breathing and I ended up in the hospital" (01/02/2012)    Patient Measurements: Height: 5\' 1"  (154.9 cm) Weight: 144 lb 14.4 oz (65.726 kg) IBW/kg (Calculated) : 47.8 Adjusted Body Weight: 53.2 Usual Weight: 65.7  Vital Signs: Temp: 98.1 F (36.7 C) (03/22 0635) Temp src: Oral (03/22 0635) BP: 127/88 mmHg (03/22 0635) Pulse Rate: 68 (03/22 0635) Intake/Output from previous day: 03/21 0701 - 03/22 0700 In: 2095 [P.O.:120; I.V.:320; IV Piggyback:100; TPN:1555] Out: 3100 [Urine:1300; Stool:1800] Intake/Output from this shift:    Labs: No results found for this basename: WBC, HGB, HCT, PLT, APTT, INR,  in the last 72 hours   Recent Labs  03/20/12 0540 03/21/12 0500 03/22/12 0540  NA 141 138 139  K 3.6 3.9 4.0  CL 102 97 97  CO2 33* 35* 34*  GLUCOSE 142* 142* 122*  BUN 20 24* 22  CREATININE 0.42* 0.59 0.60  CALCIUM 8.4 8.6 9.0  MG 1.9  --  2.0  PHOS 3.2  --   --   PROT 5.3*  --   --   ALBUMIN 2.3*  --   --   AST 16  --   --   ALT 9  --   --   ALKPHOS 57  --   --   BILITOT 0.4  --   --    Estimated Creatinine Clearance: 45.5 ml/min (by C-G formula based on Cr of 0.6).    Recent Labs  03/21/12 1937 03/22/12 0012 03/22/12 0756  GLUCAP 131* 132* 135*    Medical History: Past Medical History  Diagnosis Date  . Hypertension   . Goiter     radioactive iodine ablation/notes 01/02/2012  . COPD (chronic obstructive pulmonary disease)   . CHF (congestive heart failure)   . Pneumonia ?01/02/2012; ? 2009  . SOB (shortness of breath)     'all the time" (01/02/2012)  . Hypothyroidism     "I have taken Synthroid before" (01/02/2012)  . Arthritis     "right  shoulder" (01/02/2012)  . Diverticulitis     Medications:  Scheduled:  . acetaminophen  500 mg Oral TID  . antiseptic oral rinse  15 mL Mouth Rinse q12n4p  . chlorhexidine  15 mL Mouth Rinse BID  . cyclobenzaprine  5 mg Oral BID  . enoxaparin (LOVENOX) injection  40 mg Subcutaneous Q24H  . feeding supplement  237 mL Oral TID WC  . feeding supplement  30 mL Oral QAC breakfast  . feeding supplement  1 Container Oral TID BM  . furosemide  20 mg Intravenous Once  . furosemide  40 mg Intravenous Daily  . lisinopril  20 mg Oral Daily  . piperacillin-tazobactam (ZOSYN)  IV  3.375 g Intravenous Q8H  . polyethylene glycol  17 g Oral Daily  . [COMPLETED] potassium chloride  10 mEq Intravenous Q1 Hr x 2  . [DISCONTINUED] albuterol  2.5 mg Nebulization TID  . [DISCONTINUED] ipratropium  0.5 mg Nebulization TID  . [DISCONTINUED] Ipratropium-Albuterol  2 puff Inhalation Q6H    Insulin Requirements in the past 24 hours: SSI discontinued  Current Nutrition:  Full liquids; Clinimix E5/20 at 65 ml/hr (goal, provides 78g protein  per day and an average of ~1580 kcal/day)  Nutritional Goals:  1640-1820 kCal, 70-80 grams of protein per day  Assessment: Admitted 3/7 with abdominal pain x 1 week.  She has lost 30lb in last 15 months due to abdominal pain.  Now s/p sigmoid resection, Hartman's pouch, & end colostomy for smoldering diverticulitis; prolonged ileus.    GI:  POD#10, prolonged ileus - now resolving per MD note.  Diet advanced 3/20 to full liquids but with low PO intake due to poor appetite.  She feels her PO intake is slowly improving.  Now passing stool into her ostomy.  Endo:  No hx DM.  CBGs have been well-controlled and her SSI coverage was discontinued on 3/20.  Hx goiter, radioiodine tx , on no thyroid replacement pta.    Lytes:  K has normalized after repletion x 4 days.  Mag is at goal.  Goal K > 4 and Mg > 2 with ileus.  [facility]F: [facility] at [facility].  Renal:  Cr slightly up to 0.6  Pulm:  Hx  COPD, not wheezing.  1L [place]  Cards:  Hx HTN, CHF.  VSS.  Lisinopril, Lasix 40mg  IV qday  Hepatobil:   LFTs wnl on 3/20  Neuro: No major issues.  Vicodin for pain control.  ID:   Aspiration PNA - Day #3 Zosyn.  AFeb, last WBC 10.9; Invanz 3/7>3/18:  Diverticulitis & abscess.  Best Practices:  Lovenox VTE px TPN Access:  PICC for TNA TPN day#: 6  Plan:  Continue Clinimix E 5/20 at 65 ml/hr. Provide lipids, MVI, and trace elements on Mon-Wed-Fri only due to national backorder. Continue NS at [facility] with AM labs Follow-up tolerance and advancement of diet for potential discontinuation of TPN.    Estella Husk, Pharm.D., BCPS Clinical Pharmacist Phone: [phone] or [phone] Pager: [phone] 03/22/2012, 11:11 AM

## 2012-03-23 ENCOUNTER — Inpatient Hospital Stay (HOSPITAL_COMMUNITY): Payer: Medicare Other

## 2012-03-23 LAB — GLUCOSE, CAPILLARY
Glucose-Capillary: 129 mg/dL — ABNORMAL HIGH (ref 70–99)
Glucose-Capillary: 138 mg/dL — ABNORMAL HIGH (ref 70–99)
Glucose-Capillary: 140 mg/dL — ABNORMAL HIGH (ref 70–99)
Glucose-Capillary: 143 mg/dL — ABNORMAL HIGH (ref 70–99)

## 2012-03-23 LAB — CBC
HCT: 32.8 % — ABNORMAL LOW (ref 36.0–46.0)
Hemoglobin: 11.8 g/dL — ABNORMAL LOW (ref 12.0–15.0)
MCH: 32.2 pg (ref 26.0–34.0)
MCHC: 36 g/dL (ref 30.0–36.0)
MCV: 89.4 fL (ref 78.0–100.0)
Platelets: 328 10*3/uL (ref 150–400)
RBC: 3.67 MIL/uL — ABNORMAL LOW (ref 3.87–5.11)
RDW: 15.3 % (ref 11.5–15.5)
WBC: 9.7 10*3/uL (ref 4.0–10.5)

## 2012-03-23 LAB — BASIC METABOLIC PANEL
BUN: 21 mg/dL (ref 6–23)
Creatinine, Ser: 0.62 mg/dL (ref 0.50–1.10)
GFR calc Af Amer: >90 mL/min (ref >90)
GFR calc non Af Amer: 81 mL/min — ABNORMAL LOW (ref >90)
Potassium: 3.7 mEq/L (ref 3.5–5.1)

## 2012-03-23 MED ORDER — HALOPERIDOL LACTATE 5 MG/ML IJ SOLN
1.0000 mg | Freq: Three times a day (TID) | INTRAMUSCULAR | Status: DC | PRN
  Administered 2012-03-23 – 2012-03-24 (×3): 1 mg via INTRAVENOUS
  Filled 2012-03-23 (×4): qty 1

## 2012-03-23 MED ORDER — LORAZEPAM 2 MG/ML IJ SOLN
INTRAMUSCULAR | Status: AC
  Administered 2012-03-23: 2 mg
  Filled 2012-03-23: qty 1

## 2012-03-23 MED ORDER — HALOPERIDOL LACTATE 5 MG/ML IJ SOLN
2.0000 mg | Freq: Once | INTRAMUSCULAR | Status: AC
  Administered 2012-03-23: 2 mg via INTRAVENOUS

## 2012-03-23 MED ORDER — QUETIAPINE FUMARATE 25 MG PO TABS
25.0000 mg | ORAL_TABLET | Freq: Two times a day (BID) | ORAL | Status: DC
  Administered 2012-03-23 – 2012-03-24 (×2): 25 mg via ORAL
  Filled 2012-03-23 (×4): qty 1

## 2012-03-23 MED ORDER — CLINIMIX E/DEXTROSE (5/20) 5 % IV SOLN
INTRAVENOUS | Status: AC
  Administered 2012-03-23: 17:00:00 via INTRAVENOUS
  Filled 2012-03-23: qty 2000

## 2012-03-23 MED ORDER — LORAZEPAM 2 MG/ML IJ SOLN
1.0000 mg | Freq: Once | INTRAMUSCULAR | Status: AC
  Administered 2012-03-23: 1 mg via INTRAVENOUS

## 2012-03-23 MED ORDER — QUETIAPINE FUMARATE 25 MG PO TABS
25.0000 mg | ORAL_TABLET | Freq: Every evening | ORAL | Status: DC | PRN
  Filled 2012-03-23: qty 1

## 2012-03-23 MED ORDER — HALOPERIDOL LACTATE 5 MG/ML IJ SOLN: 0.5000 mg | Freq: Four times a day (QID) | INTRAMUSCULAR | Status: DC | PRN

## 2012-03-23 MED ORDER — ZOLPIDEM TARTRATE 5 MG PO TABS: 5.0000 mg | ORAL_TABLET | Freq: Every evening | ORAL | Status: DC | PRN

## 2012-03-23 MED ORDER — DIPHENHYDRAMINE HCL 25 MG PO CAPS
25.0000 mg | ORAL_CAPSULE | Freq: Once | ORAL | Status: AC
  Administered 2012-03-23: 25 mg via ORAL
  Filled 2012-03-23: qty 1

## 2012-03-23 NOTE — Consult Note (Signed)
Reason for Consult: Delerium Referring Physician: Dr. Thedore Mins  CC: Confusion  HPI: [name] is a pleasant 79 y.o. female who was admitted to [facility] on 03/07/2012 for treatment of diverticulitis. Prior to admission the patient was living alone and independently. On 03/12/2012 the patient underwent sigmoid resection with Hartman's pouch and end Colostomy. On 03/22/2012 in the afternoon the patient became mildly confused and delirious. The patient's son reported that she had been intermittently confused for several days prior to this; although, he states he had never known his mother to be confused prior to this. She does have a history of intermittent insomnia and according to her nurse she is not had any restful sleep for the past 4-5 nights. Her home medication list did not include any benzodiazepines. Since her symptoms started in the hospital she has received Benadryl, Haldol, lorazepam, Flexeril, tramadol, and Ambien without improvement and possibly worsening of her symptoms. A CT scan of the head performed 03/22/2012 showed no acute abnormality. A neurology consult was been requested in an attempt to sort this out.  Past Medical History  Diagnosis Date  . Hypertension   . Goiter     radioactive iodine ablation/notes 01/02/2012  . COPD (chronic obstructive pulmonary disease)   . CHF (congestive heart failure)   . Pneumonia ?01/02/2012; ? 2009  . SOB (shortness of breath)     'all the time" (01/02/2012)  . Hypothyroidism     "I have taken Synthroid before" (01/02/2012)  . Arthritis     "right shoulder" (01/02/2012)  . Diverticulitis     Past Surgical History  Procedure Laterality Date  . Cataract extraction w/ intraocular lens  implant, bilateral  ?1990's  . Appendectomy  1980's    "when they did hysterectomy" (01/02/2012)  . Abdominal hysterectomy  1980's  . Colostomy revision N/A 03/12/2012    Procedure: COLON RESECTION SIGMOID ;  Surgeon: Axel Filler, MD;  Location: [name] OR;   Service: General;  Laterality: N/A;  . Colostomy N/A 03/12/2012    Procedure: COLOSTOMY;  Surgeon: Axel Filler, MD;  Location: [facility] OR;  Service: General;  Laterality: N/A;    Family History  Problem Relation Age of Onset  . Heart disease    . Cancer Sister     Breast    Social History:  reports that she quit smoking about 21 years ago. Her smoking use included Cigarettes. She has a 4.2 [name]-year smoking history. She has never used smokeless tobacco. She reports that she does not drink alcohol or use illicit drugs.  Allergies  Allergen Reactions  . Indomethacin Anaphylaxis and Other (See Comments)    "took it fine for awhile; one day I stopped breathing and I ended up in the hospital" (01/02/2012)    Medications:  Scheduled: . acetaminophen  500 mg Oral TID  . antiseptic oral rinse  15 mL Mouth Rinse q12n4p  . chlorhexidine  15 mL Mouth Rinse BID  . enoxaparin (LOVENOX) injection  40 mg Subcutaneous Q24H  . furosemide  40 mg Intravenous Daily  . lisinopril  20 mg Oral Daily  . piperacillin-tazobactam (ZOSYN)  IV  3.375 g Intravenous Q8H  . polyethylene glycol  17 g Oral Daily  . QUEtiapine  25 mg Oral BID    ROS: The patient is unable to give a reliable review of systems at this time.  Physical Examination: Blood pressure 125/67, pulse 73, temperature 98.8 F (37.1 C), temperature source Oral, resp. rate 20, height 5\' 1"  (1.549 m), weight  65.726 kg (144 lb 14.4 oz), SpO2 93.00%.  General - pleasantly confused 79 year old female with mild agitation. Heart - Regular rate and rhythm - no murmer Lungs - decreased breath sounds with occasional wheezing. Abdomen - Soft - non tender Extremities - Distal pulses intact - no edema Skin - Warm and dry  Neurologic Examination:  Blood pressure 162/92, pulse 90, temperature 98.6 F (37 C), temperature source Oral, resp. rate 18, SpO2 98.00%.  Mental Status:  Alert, oriented, but inappropriate thought content. Speech mildly  dysarthric possibly secondary to dentures and a dry mouth. No evidence of aphasia. Able to follow simple commands with cueing. Cranial Nerves:  II: Discs not visualized; Visual fields grossly normal, pupils equal, round, reactive to light and accommodation  III,IV, VI: ptosis not present, extra-ocular motions intact bilaterally  V,VII: smile asymmetric on the right, facial light touch decreased sensation on the right  VIII: hearing normal bilaterally  IX,X: gag reflex present  XI: bilateral shoulder shrug  XII: midline tongue extension  Motor:  Right : Upper extremity 5-/5 Left: Upper extremity 5/5  Lower extremity 5/5 Lower extremity 5/5  Tone and bulk:normal tone throughout; no atrophy noted  Sensory: Unreliable  Deep Tendon Reflexes: 2+ and symmetric throughout  Plantars:  Right: downgoing Left: downgoing  Cerebellar:  The patient was unable to follow commands for finger to nose or heel-to-shin testing. CV: pulses weak but intact.   Laboratory Studies:   Basic Metabolic Panel:  Recent Labs Lab 03/17/12 0705 03/18/12 0557 03/19/12 0541 03/20/12 0540 03/21/12 0500 03/22/12 0540 03/23/12 0505  NA 145 146* 143 141 138 139 137  K 3.4* 3.6 3.7 3.6 3.9 4.0 3.7  CL 111 111 107 102 97 97 97  CO2 28 29 30  33* 35* 34* 31  GLUCOSE 131* 154* 144* 142* 142* 122* 135*  BUN 15 17 18 20  24* 22 21  CREATININE 0.61 0.51 0.46* 0.42* 0.59 0.60 0.62  CALCIUM 8.7 8.5 8.4 8.4 8.6 9.0 9.1  MG 1.8 2.2  --  1.9  --  2.0  --   PHOS  --  3.5  --  3.2  --   --   --     Liver Function Tests:  Recent Labs Lab 03/18/12 0557 03/20/12 0540  AST 10 16  ALT 8 9  ALKPHOS 54 57  BILITOT 0.3 0.4  PROT 5.3* 5.3*  ALBUMIN 2.3* 2.3*   No results found for this basename: LIPASE, AMYLASE,  in the last 168 hours No results found for this basename: AMMONIA,  in the last 168 hours  CBC:  Recent Labs Lab 03/17/12 0705 03/18/12 0557 03/22/12 1715 03/23/12 0505  WBC 7.9 10.9* 11.0* 9.7   NEUTROABS  --  9.2*  --   --   HGB 12.8 12.4 11.4* 11.8*  HCT 38.9 37.8 33.9* 32.8*  MCV 86.4 88.1 85.8 89.4  PLT 364 391 306 328    Cardiac Enzymes: No results found for this basename: CKTOTAL, CKMB, CKMBINDEX, TROPONINI,  in the last 168 hours  BNP: No components found with this basename: POCBNP,   CBG:  Recent Labs Lab 03/22/12 2024 03/23/12 0012 03/23/12 0421 03/23/12 0742 03/23/12 1200  GLUCAP 124* 129* 138* 143* 134*    Microbiology: Results for orders placed during the hospital encounter of 03/07/12  SURGICAL PCR SCREEN     Status: None   Collection Time    03/11/12  9:28 PM      Result Value Range Status   MRSA, PCR  NEGATIVE  NEGATIVE Final   Staphylococcus aureus NEGATIVE  NEGATIVE Final   Comment:            The Xpert SA Assay (FDA     approved for NASAL specimens     in patients over 73 years of age),     is one component of     a comprehensive surveillance     program.  Test performance has     been validated by [employer] for patients greater     than or equal to 11 year old.     It is not intended     to diagnose infection nor to     guide or monitor treatment.  URINE CULTURE     Status: None   Collection Time    03/19/12 10:21 AM      Result Value Range Status   Specimen Description URINE, CLEAN CATCH   Final   Special Requests NONE   Final   Culture  Setup Time 03/19/2012 12:09   Final   Colony Count NO GROWTH   Final   Culture NO GROWTH   Final   Report Status 03/20/2012 FINAL   Final    Coagulation Studies: No results found for this basename: LABPROT, INR,  in the last 72 hours  Urinalysis:  Recent Labs Lab 03/19/12 1021  COLORURINE YELLOW  LABSPEC 1.027  PHURINE 6.0  GLUCOSEU NEGATIVE  HGBUR LARGE*  BILIRUBINUR NEGATIVE  KETONESUR NEGATIVE  PROTEINUR 30*  UROBILINOGEN 0.2  NITRITE NEGATIVE  LEUKOCYTESUR NEGATIVE    Lipid Panel:     Component Value Date/Time   CHOL 94 03/18/2012 0425   TRIG 124 03/18/2012 0425    HDL 20* 01/03/2007 0335   CHOLHDL 6.4 01/03/2007 0335   VLDL 10 01/03/2007 0335   LDLCALC  Value: 98        Total Cholesterol/HDL:CHD Risk Coronary Heart Disease Risk Table                     Men   Women  1/2 Average Risk   3.4   3.3  Average Risk       5.0   4.4  2 X Average Risk   9.6   7.1  3 X Average Risk  23.4   11.0        Use the calculated Patient Ratio above and the CHD Risk Table to determine the patient's CHD Risk.        ATP III CLASSIFICATION (LDL):  <100     mg/dL   Optimal  161-096  mg/dL   Near or Above                    Optimal  130-159  mg/dL   Borderline  045-409  mg/dL   High  >811     mg/dL   Very High 09/01/4780 9562    HgbA1C:  Lab Results  Component Value Date   HGBA1C  Value: 5.3 (NOTE)   The ADA recommends the following therapeutic goals for glycemic   control related to Hgb A1C measurement:   Goal of Therapy:   < 7.0% Hgb A1C   Action Suggested:  > 8.0% Hgb A1C   Ref:  Diabetes Care, 22, Suppl. 1, 1999 01/03/2007    Urine Drug Screen:     Component Value Date/Time   LABOPIA NONE DETECTED 01/03/2007 0006   COCAINSCRNUR NONE DETECTED 01/03/2007 0006   LABBENZ  NONE DETECTED 01/03/2007 0006   AMPHETMU NONE DETECTED 01/03/2007 0006   THCU NONE DETECTED 01/03/2007 0006   LABBARB  Value: NONE DETECTED        DRUG SCREEN FOR MEDICAL PURPOSES ONLY.  IF CONFIRMATION IS NEEDED FOR ANY PURPOSE, NOTIFY LAB WITHIN 5 DAYS.        LOWEST DETECTABLE LIMITS FOR URINE DRUG SCREEN Drug Class       Cutoff (ng/mL) Amphetamine      1000 Barbiturate      200 Benzodiazepine   200 Cocaine          300 Opiates          300 THC              50 01/03/2007 0006    Alcohol Level: No results found for this basename: ETH,  in the last 168 hours  Other results: EKG: SR 86 BPM with PVCs  Imaging:  Ct Head Wo Contrast 03/22/12 No evidence of acute intracranial abnormality.  Chronic small vessel white matter ischemic changes and remote right occipital/posterior parietal infarct.      Dg Chest Port 1  View 03/23/12 1.  Stable mild cardiomegaly. 2.  Suspect goiter. In the absence of any prior studies, consider elective outpatient thyroid ultrasound for further characterization.       Dg Abd Acute W/chest 03/22/12  Bibasilar airspace disease, with mild worsening on the left. Question atelectasis versus pneumonia.  Dilated small bowel with air-fluid levels, with mild improvement. This is most compatible with small bowel obstruction.   Dg Abd Portable 1v 03/23/12  Little change in degree of small bowel dilatation.       Assessment/Plan:  This is a pleasant 79 year old female admitted to [facility], hospital on 03/07/2012 with diverticulitis who eventually  required sigmoid resection on 03/12/2012. On approximately 03/18/2012 the patient developed intermittent confusion and hallucinations consistent with delirium. This was also accompanied by insomnia. Her symptoms have progressed despite medical therapy with multiple different medications. A CT scan of the head on 03/22/2012 was negative for acute abnormalities .  Dr. Amada Jupiter has reviewed the patient's chart, spoken with the patient's son, and examined the patient. He agrees with the current plan to treat the patient with seroquel 25 mg twice each day. The patient could also receive one or two additional 25 mg doses at bedtime if sleep is not obtained.  Further recommendations would be to order an MRI of the brain to rule out stroke.  Delton See PA-C [facility] Pager [phone] 03/23/2012, 5:55 PM  79 yo  F with delirium and dysarthria. She sees patterns on the walls as well as people who have been dead for some time. She is aware they are not real at this time. Given the dysarthria, would get MRI brain to rule out CVA, however I feel that this is unlikely. Agree with low dose neuroleptic at night to aid with sleep.   Ritta Slot, MD [facility] [phone]  If 7pm- 7am, please page neurology on  call at [phone].

## 2012-03-23 NOTE — Progress Notes (Signed)
11 Days Post-Op  Subjective: Had some nausea but she says that her abdomen is less distended and feels better  Objective: Vital signs in last 24 hours: Temp:  [97.6 F (36.4 C)-98.4 F (36.9 C)] 97.6 F (36.4 C) (03/23 0550) Pulse Rate:  [77-83] 83 (03/23 0550) Resp:  [17-18] 17 (03/23 0550) BP: (121-146)/(73-95) 133/95 mmHg (03/23 0550) SpO2:  [94 %-98 %] 96 % (03/23 0550) Last BM Date: 03/22/12  Intake/Output from previous day: 03/22 0701 - 03/23 0700 In: -  Out: 4475 [Urine:3900; Stool:575] Intake/Output this shift:    General appearance: alert, cooperative, no distress and responds appropriately, but has some delirium Resp: winded with speech but sats okay GI: abdomen is less distended today and no obvious tenderness, ostomy functioning with stool and gas  Lab Results:   Recent Labs  03/22/12 1715 03/23/12 0505  WBC 11.0* 9.7  HGB 11.4* 11.8*  HCT 33.9* 32.8*  PLT 306 328   BMET  Recent Labs  03/22/12 0540 03/23/12 0505  NA 139 137  K 4.0 3.7  CL 97 97  CO2 34* 31  GLUCOSE 122* 135*  BUN 22 21  CREATININE 0.60 0.62  CALCIUM 9.0 9.1   PT/INR No results found for this basename: LABPROT, INR,  in the last 72 hours ABG No results found for this basename: PHART, PCO2, PO2, HCO3,  in the last 72 hours  Studies/Results: Dg Lumbar Spine 2-3 Views  03/21/2012  *RADIOLOGY REPORT*  Clinical Data: 79 year old female with back pain.  No injury.  LUMBAR SPINE - 2-3 VIEW  Comparison: CT abdomen pelvis 03/07/2012 and earlier.  Findings: Osteopenia.  Normal lumbar segmentation.  Stable vertebral height and alignment since 03/07/2012.  Mild grade 1 anterolisthesis of L4 on L5.  Multilevel moderate to severe lumbar facet hypertrophy.  Grossly stable visible lower thoracic levels. Midline abdominal skin staples. Calcified atherosclerosis of the abdominal aorta and iliac arteries.  Multiple gas-filled small bowel loops measuring up to 50 mm diameter.  IMPRESSION: 1.  Stable  lumbar vertebral height and alignment since 03/07/2012. 2.  Gas filled dilated small bowel loops up to 50 mm diameter suggestive of small bowel obstruction.   Original Report Authenticated By: Erskine Speed, M.D.    Ct Head Wo Contrast  03/22/2012  *RADIOLOGY REPORT*  Clinical Data: 79 year old female with altered mental status and confusion.  CT HEAD WITHOUT CONTRAST  Technique:  Contiguous axial images were obtained from the base of the skull through the vertex without contrast.  Comparison: None  Findings: Chronic small vessel white matter ischemic changes are present. A remote right occipital/posterior parietal infarct is present. No acute intracranial abnormalities are identified, including mass lesion or mass effect, hydrocephalus, extra-axial fluid collection, midline shift, hemorrhage, or acute infarction.  The visualized bony calvarium is unremarkable.  IMPRESSION: No evidence of acute intracranial abnormality.  Chronic small vessel white matter ischemic changes and remote right occipital/posterior parietal infarct.   Original Report Authenticated By: Harmon Pier, M.D.    Dg Chest Port 1 View  03/23/2012  *RADIOLOGY REPORT*  Clinical Data: Shortness of breath.  PORTABLE CHEST - 1 VIEW  Comparison:   the previous day's study  Findings: Right arm PICC stable.  Coarse bilateral calcifications, presumably thyroid, at the thoracic inlet with possible tracheal narrowing. Heart size upper limits normal.  Lungs are clear.  No effusion.  IMPRESSION:  1.  Stable mild cardiomegaly. 2.  Suspect goiter. In the absence of any prior studies, consider elective outpatient thyroid ultrasound for  further characterization.   Original Report Authenticated By: D. Andria Rhein, MD    Dg Abd Acute W/chest  03/22/2012  *RADIOLOGY REPORT*  Clinical Data: Abdominal pain  ACUTE ABDOMEN SERIES (ABDOMEN 2 VIEW & CHEST 1 VIEW)  Comparison: In the 03/21/2012  Findings: Right arm PICC tip in the SVC, unchanged.  Right lower lobe  airspace disease again noted and unchanged.  There is mild left lower lobe atelectasis which has increased slightly.  This could be due to atelectasis or pneumonia.  Mild vascular congestion without edema.  Calcified goiter.  Advanced degenerative change right shoulder  Dilated small bowel loops with air-fluid levels, with mild improvement since the prior study.  Colon is decompressed.  No free air on the decubitus view.  IMPRESSION: Bibasilar airspace disease, with mild worsening on the left. Question atelectasis versus pneumonia.  Dilated small bowel with air-fluid levels, with mild improvement. This is most compatible with small bowel obstruction.   Original Report Authenticated By: Janeece Riggers, M.D.    Dg Abd Portable 1v  03/23/2012  *RADIOLOGY REPORT*  Clinical Data: Small bowel obstruction.  PORTABLE ABDOMEN - 1 VIEW  Comparison:   the previous day's study  Findings: Persistent dilated   small bowel loops in the mid and lower abdomen.  The colon appears decompressed.  The stomach is nondilated.  Midline skin staples.  Degenerative changes in the lower lumbar spine.  Visualized lung bases clear.  IMPRESSION:  1.  Little change in degree of small bowel dilatation.   Original Report Authenticated By: D. Andria Rhein, MD     Anti-infectives: Anti-infectives   Start     Dose/Rate Route Frequency Ordered Stop   03/20/12 0900  piperacillin-tazobactam (ZOSYN) IVPB 3.375 g     3.375 g 12.5 mL/hr over 240 Minutes Intravenous 3 times per day 03/20/12 0839     03/08/12 1900  ertapenem (INVANZ) 1 g in sodium chloride 0.9 % 50 mL IVPB  Status:  Discontinued     1 g 100 mL/hr over 30 Minutes Intravenous Every 24 hours 03/08/12 1408 03/19/12 1106   03/07/12 1800  ertapenem (INVANZ) 1 g in sodium chloride 0.9 % 50 mL IVPB     1 g 100 mL/hr over 30 Minutes Intravenous To Emergency Dept 03/07/12 1721 03/07/12 1955   03/07/12 1730  ertapenem (INVANZ) 1 g in sodium chloride 0.9 % 50 mL IVPB  Status:  Discontinued      1 g 100 mL/hr over 30 Minutes Intravenous  Once 03/07/12 1716 03/07/12 1717      Assessment/Plan: s/p Procedure(s): COLON RESECTION SIGMOID  (N/A) COLOSTOMY (N/A) I am not sure why she fluctuates with her bowel function. She seems to be less distended today and ostomy working but she had some nausea today.  I do not see any evidence of postop complication.  I would continue TPN until she is able to eat.  For now she is NPO.  Her delirium is treated by primary team but there is no obvious infection or hypoxia as she is sating well.  LOS: 16 days    Lodema Pilot DAVID 03/23/2012

## 2012-03-23 NOTE — Progress Notes (Addendum)
Patient ID: [name]  female  ZOX:[id number]    DOB: [birth year]    DOA: 03/07/2012  PCP: Kaleen Mask, MD  Assessment/Plan:  Recurrent acute sigmoid diverticulitis with intra-abdominal abscess:  post-op ileus, passing stool in colostomy bag.  Feels bloated today 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  - placed on clears, on TPN  - CCS closely following, D/W Dr Biagio Quint bedside 03-22-12 and 03/23/2012, clear liquids with feeding assistance and aspiration precautions, repeat KUB     Low back pain: Obtained x-ray of the lower spine doesn't show any acute spinal abnormality however did show small bowel obstruction (? Cause of bloating), CCS following, currently no back pain complaint.     Ongoing delirium.  Had detailed discussion with family members and nursing staff, apparently patient has had poor quality of sleep since her admission, also family and nursing staff state that delirium and confusion has been going on on and off basis for last 4-5 days however it's getting worse since 03/22/2012, I have checked a baseline EKG and QTC is stable, will commence her on when necessary Haldol, we'll try to avoid benzodiazepines except when necessary each bedtime Ambien to help her sleep, apparently she has not slept for the last 4-5 days and this could be contributing to her confusion. Fall and aspiration precautions. DC flexeril, Neuro input.  Did check a baseline CT scan of the head which is unremarkable.  Son and daughter updated bedside.     Hypertension: cont lisinopril, stable     History of Chronic diastolic CHF (congestive heart failure): fluid overloaded now after starting TPN, had been on IVF BNP 3342, Bilateral pleural effusion likely secondary to volume overload, -1 L in the last 24 hour shift however still net 9L positive since admission   -  EF 55-60% based on 2D Echo on 02/25/2009 -  cont IV lasix ( another extra dose 03-22-12), nebs, O2,  Improving.     - Right lobe pneumonia, likely aspiration, continue IV Zosyn , CBC and clinically stable now. We'll continue clear liquids with feeding assistance and aspiration precautions, speech to evaluate, repeat chest x-ray.         H/O: hypothyroidism - Stable, last TSH 1/14 normal, the patient is not on any replacement    History of COPD  - placed on alb and atrovent nebs      DVT Prophylaxis: lovenox  Code Status:  Disposition: declined by in-patient rehab  Updated son and daughter bedside   Subjective: States shortness of breath has started improving but today feels less bloated with no nausea, no back pain, she says that she's been confused.  Objective: Weight change:   Intake/Output Summary (Last 24 hours) at 03/23/12 8119 Last data filed at 03/23/12 0550  Gross per 24 hour  Intake      0 ml  Output   4475 ml  Net  -4475 ml   Blood pressure 133/95, pulse 83, temperature 97.6 F (36.4 C), temperature source Oral, resp. rate 17, height 5\' 1"  (1.549 m), weight 65.726 kg (144 lb 14.4 oz), SpO2 96.00%.  Physical Exam: General: Awake but not alert, appears confused, NAD , CVS: S1-S2 clear Chest: Decreased breath sound at the bases, no active rhonchi or wheezing Abdomen:  Dressing intact, colostomy with output, mild ABD distension appears to have improved since yesterday Extremities: no c/c/e   Lab Results: Basic Metabolic Panel:  Recent Labs Lab 03/20/12 0540  03/22/12 0540 03/23/12 0505  NA  141  < > 139 137  K 3.6  < > 4.0 3.7  CL 102  < > 97 97  CO2 33*  < > 34* 31  GLUCOSE 142*  < > 122* 135*  BUN 20  < > 22 21  CREATININE 0.42*  < > 0.60 0.62  CALCIUM 8.4  < > 9.0 9.1  MG 1.9  --  2.0  --   PHOS 3.2  --   --   --   < > = values in this interval not displayed. Liver Function Tests:  Recent Labs Lab 03/18/12 0557 03/20/12 0540  AST 10 16  ALT 8 9  ALKPHOS 54 57  BILITOT 0.3 0.4  PROT 5.3* 5.3*  ALBUMIN 2.3* 2.3*    CBC:  Recent Labs Lab 03/18/12 0557 03/22/12 1715 03/23/12 0505  WBC 10.9* 11.0* 9.7  NEUTROABS 9.2*  --   --   HGB 12.4 11.4* 11.8*  HCT 37.8 33.9* 32.8*  MCV 88.1 85.8 89.4  PLT 391 306 328   Cardiac Enzymes: No results found for this basename: CKTOTAL, CKMB, CKMBINDEX, TROPONINI,  in the last 168 hours BNP: No components found with this basename: POCBNP,  CBG:  Recent Labs Lab 03/22/12 1605 03/22/12 2024 03/23/12 0012 03/23/12 0421 03/23/12 0742  GLUCAP 129* 124* 129* 138* 143*     Micro Results: Recent Results (from the past 240 hour(s))  URINE CULTURE     Status: None   Collection Time    03/19/12 10:21 AM      Result Value Range Status   Specimen Description URINE, CLEAN CATCH   Final   Special Requests NONE   Final   Culture  Setup Time 03/19/2012 12:09   Final   Colony Count NO GROWTH   Final   Culture NO GROWTH   Final   Report Status 03/20/2012 FINAL   Final        Medications: Scheduled Meds: . acetaminophen  500 mg Oral TID  . antiseptic oral rinse  15 mL Mouth Rinse q12n4p  . chlorhexidine  15 mL Mouth Rinse BID  . cyclobenzaprine  5 mg Oral BID  . enoxaparin (LOVENOX) injection  40 mg Subcutaneous Q24H  . furosemide  40 mg Intravenous Daily  . lisinopril  20 mg Oral Daily  . piperacillin-tazobactam (ZOSYN)  IV  3.375 g Intravenous Q8H  . polyethylene glycol  17 g Oral Daily      LOS: 16 days   Leroy Sea M.D. [facility] 03/23/2012, 8:22 AM Pager: [phone]  If 7PM-7AM, please contact night-coverage [email or web address] Password [email or web address]

## 2012-03-23 NOTE — Progress Notes (Signed)
PARENTERAL NUTRITION CONSULT NOTE  Pharmacy Consult for TPN Indication: Prolonged ileus   Allergies  Allergen Reactions  . Indomethacin Anaphylaxis and Other (See Comments)    "took it fine for awhile; one day I stopped breathing and I ended up in the hospital" (01/02/2012)    Patient Measurements: Height: 5\' 1"  (154.9 cm) Weight: 144 lb 14.4 oz (65.726 kg) IBW/kg (Calculated) : 47.8 Adjusted Body Weight: 53.2 Usual Weight: 65.7  Vital Signs: Temp: 97.6 F (36.4 C) (03/23 0550) Temp src: Oral (03/22 2200) BP: 133/95 mmHg (03/23 0550) Pulse Rate: 83 (03/23 0550) Intake/Output from previous day: 03/22 0701 - 03/23 0700 In: -  Out: 4475 [Urine:3900; Stool:575] Intake/Output from this shift:    Labs:  Recent Labs  03/22/12 1715 03/23/12 0505  WBC 11.0* 9.7  HGB 11.4* 11.8*  HCT 33.9* 32.8*  PLT 306 328     Recent Labs  03/21/12 0500 03/22/12 0540 03/23/12 0505  NA 138 139 137  K 3.9 4.0 3.7  CL 97 97 97  CO2 35* 34* 31  GLUCOSE 142* 122* 135*  BUN 24* 22 21  CREATININE 0.59 0.60 0.62  CALCIUM 8.6 9.0 9.1  MG  --  2.0  --    Estimated Creatinine Clearance: 45.5 ml/min (by C-G formula based on Cr of 0.62).    Recent Labs  03/23/12 0012 03/23/12 0421 03/23/12 0742  GLUCAP 129* 138* 143*    Medical History: Past Medical History  Diagnosis Date  . Hypertension   . Goiter     radioactive iodine ablation/notes 01/02/2012  . COPD (chronic obstructive pulmonary disease)   . CHF (congestive heart failure)   . Pneumonia ?01/02/2012; ? 2009  . SOB (shortness of breath)     'all the time" (01/02/2012)  . Hypothyroidism     "I have taken Synthroid before" (01/02/2012)  . Arthritis     "right shoulder" (01/02/2012)  . Diverticulitis     Medications:  Scheduled:  . acetaminophen  500 mg Oral TID  . antiseptic oral rinse  15 mL Mouth Rinse q12n4p  . chlorhexidine  15 mL Mouth Rinse BID  . cyclobenzaprine  5 mg Oral BID  . [COMPLETED] diphenhydrAMINE  25  mg Oral Once  . enoxaparin (LOVENOX) injection  40 mg Subcutaneous Q24H  . [COMPLETED] furosemide  20 mg Intravenous Once  . furosemide  40 mg Intravenous Daily  . lisinopril  20 mg Oral Daily  . piperacillin-tazobactam (ZOSYN)  IV  3.375 g Intravenous Q8H  . polyethylene glycol  17 g Oral Daily  . [DISCONTINUED] feeding supplement  237 mL Oral TID WC  . [DISCONTINUED] feeding supplement  30 mL Oral QAC breakfast  . [DISCONTINUED] feeding supplement  1 Container Oral TID BM    Insulin Requirements in the past 24 hours: SSI discontinued  Current Nutrition:  Clear liquids; Clinimix E5/20 at 65 ml/hr (goal, provides 78g protein per day and an average of ~1580 kcal/day)  Nutritional Goals:  1640-1820 kCal, 70-80 grams of protein per day  Assessment: Admitted 3/7 with abdominal pain x 1 week.  She has lost 30lb in last 15 months due to abdominal pain.  Now s/p sigmoid resection, Hartman's pouch, & end colostomy for smoldering diverticulitis; prolonged ileus.    GI:  POD#11, prolonged ileus - now resolving per MD note.  Now passing stool into her ostomy.  Difficulties with PO intake - poor appetite, possible aspiration, delirium.  Endo:  No hx DM.  CBGs have been well-controlled and her  SSI coverage was discontinued on 3/20.  Hx goiter, radioiodine tx , on no thyroid replacement pta.    Lytes:  K is slightly below goal.  Goal K > 4 and Mg > 2 with ileus.  IVF: NS at [facility].  Renal:  Cr stable at 0.6  Pulm:  Hx COPD, not wheezing.  1L   Cards:  Hx HTN, CHF.  VSS.  Lisinopril, Lasix 40mg  IV qday for volume overload.  Hepatobil:   LFTs wnl on 3/20  Neuro: No major issues.  Vicodin for pain control.  ID:   Aspiration PNA - Day #4 Zosyn.  AFeb, WBC 9.7; s/p Invanz 3/7>3/18 for Diverticulitis & abscess.  Best Practices:  Lovenox VTE px TPN Access:  PICC for TNA TPN day#: 7  Plan:  Continue Clinimix E 5/20 at 65 ml/hr. Provide lipids, MVI, and trace elements on Mon-Wed-Fri only due  to national backorder. Continue NS at [facility] TPN labs in AM Follow-up tolerance and advancement of diet for potential discontinuation of TPN.    Estella Husk, Pharm.D., BCPS Clinical Pharmacist Phone: [phone] or [phone] Pager: [phone] 03/23/2012, 9:57 AM

## 2012-03-24 ENCOUNTER — Inpatient Hospital Stay (HOSPITAL_COMMUNITY): Payer: Medicare Other

## 2012-03-24 DIAGNOSIS — R4182 Altered mental status, unspecified: Secondary | ICD-10-CM

## 2012-03-24 DIAGNOSIS — R3129 Other microscopic hematuria: Secondary | ICD-10-CM

## 2012-03-24 DIAGNOSIS — R404 Transient alteration of awareness: Secondary | ICD-10-CM

## 2012-03-24 DIAGNOSIS — R410 Disorientation, unspecified: Secondary | ICD-10-CM | POA: Diagnosis not present

## 2012-03-24 LAB — BLOOD GAS, ARTERIAL
Bicarbonate: 29.6 mEq/L — ABNORMAL HIGH (ref 20.0–24.0)
Drawn by: 205171
O2 Content: 2 L/min
O2 Saturation: 97.8 %
pCO2 arterial: 46.2 mmHg — ABNORMAL HIGH (ref 35.0–45.0)
pH, Arterial: 7.422 (ref 7.350–7.450)
pO2, Arterial: 84.6 mmHg (ref 80.0–100.0)

## 2012-03-24 LAB — GLUCOSE, CAPILLARY: Glucose-Capillary: 137 mg/dL — ABNORMAL HIGH (ref 70–99)

## 2012-03-24 LAB — CBC
Hemoglobin: 11.1 g/dL — ABNORMAL LOW (ref 12.0–15.0)
MCHC: 33.7 g/dL (ref 30.0–36.0)
RBC: 3.7 MIL/uL — ABNORMAL LOW (ref 3.87–5.11)

## 2012-03-24 LAB — DIFFERENTIAL
Basophils Relative: 1 % (ref 0–1)
Monocytes Relative: 13 % — ABNORMAL HIGH (ref 3–12)
Neutro Abs: 7.3 10*3/uL (ref 1.7–7.7)
Neutrophils Relative %: 77 % (ref 43–77)

## 2012-03-24 LAB — PRO B NATRIURETIC PEPTIDE: Pro B Natriuretic peptide (BNP): 821.4 pg/mL — ABNORMAL HIGH (ref 0–450)

## 2012-03-24 LAB — COMPREHENSIVE METABOLIC PANEL
Albumin: 2.7 g/dL — ABNORMAL LOW (ref 3.5–5.2)
BUN: 24 mg/dL — ABNORMAL HIGH (ref 6–23)
Calcium: 8.8 mg/dL (ref 8.4–10.5)
Creatinine, Ser: 0.65 mg/dL (ref 0.50–1.10)
Total Protein: 6.3 g/dL (ref 6.0–8.3)

## 2012-03-24 LAB — PHOSPHORUS: Phosphorus: 3.6 mg/dL (ref 2.3–4.6)

## 2012-03-24 LAB — TRIGLYCERIDES: Triglycerides: 89 mg/dL (ref <150)

## 2012-03-24 LAB — MAGNESIUM: Magnesium: 2.2 mg/dL (ref 1.5–2.5)

## 2012-03-24 LAB — AMMONIA: Ammonia: 13 umol/L (ref 11–60)

## 2012-03-24 MED ORDER — ALBUTEROL SULFATE (5 MG/ML) 0.5% IN NEBU
2.5000 mg | INHALATION_SOLUTION | Freq: Four times a day (QID) | RESPIRATORY_TRACT | Status: DC
  Administered 2012-03-24 – 2012-03-25 (×6): 2.5 mg via RESPIRATORY_TRACT
  Filled 2012-03-24 (×6): qty 0.5

## 2012-03-24 MED ORDER — TRACE MINERALS CR-CU-F-FE-I-MN-MO-SE-ZN IV SOLN
INTRAVENOUS | Status: AC
  Administered 2012-03-24: 18:00:00 via INTRAVENOUS
  Filled 2012-03-24: qty 2000

## 2012-03-24 MED ORDER — FUROSEMIDE 10 MG/ML IJ SOLN
40.0000 mg | Freq: Once | INTRAMUSCULAR | Status: DC
  Filled 2012-03-24: qty 4

## 2012-03-24 MED ORDER — IPRATROPIUM BROMIDE 0.02 % IN SOLN
0.5000 mg | Freq: Four times a day (QID) | RESPIRATORY_TRACT | Status: DC
  Administered 2012-03-24 – 2012-03-25 (×6): 0.5 mg via RESPIRATORY_TRACT
  Filled 2012-03-24 (×6): qty 2.5

## 2012-03-24 MED ORDER — FAT EMULSION 20 % IV EMUL
250.0000 mL | INTRAVENOUS | Status: AC
  Administered 2012-03-24: 250 mL via INTRAVENOUS
  Filled 2012-03-24: qty 250

## 2012-03-24 MED ORDER — ZIPRASIDONE MESYLATE 20 MG IM SOLR
20.0000 mg | Freq: Every evening | INTRAMUSCULAR | Status: DC | PRN
  Filled 2012-03-24: qty 20

## 2012-03-24 MED ORDER — LORAZEPAM 2 MG/ML IJ SOLN
0.5000 mg | Freq: Once | INTRAMUSCULAR | Status: AC
  Administered 2012-03-24: 0.5 mg via INTRAVENOUS
  Filled 2012-03-24: qty 1

## 2012-03-24 NOTE — Evaluation (Signed)
Clinical/Bedside Swallow Evaluation Patient Details  Name: [name] MRN: [id number] Date of Birth: [birth year]  Today's Date: 03/24/2012 Time: 0900-0935 SLP Time Calculation (min): 35 min  Past Medical History:  Past Medical History  Diagnosis Date  . Hypertension   . Goiter     radioactive iodine ablation/notes 01/02/2012  . COPD (chronic obstructive pulmonary disease)   . CHF (congestive heart failure)   . Pneumonia ?01/02/2012; ? 2009  . SOB (shortness of breath)     'all the time" (01/02/2012)  . Hypothyroidism     "I have taken Synthroid before" (01/02/2012)  . Arthritis     "right shoulder" (01/02/2012)  . Diverticulitis    Past Surgical History:  Past Surgical History  Procedure Laterality Date  . Cataract extraction w/ intraocular lens  implant, bilateral  ?1990's  . Appendectomy  1980's    "when they did hysterectomy" (01/02/2012)  . Abdominal hysterectomy  1980's  . Colostomy revision N/A 03/12/2012    Procedure: COLON RESECTION SIGMOID ;  Surgeon: Axel Filler, MD;  Location: [facility] OR;  Service: General;  Laterality: N/A;  . Colostomy N/A 03/12/2012    Procedure: COLOSTOMY;  Surgeon: Axel Filler, MD;  Location: [facility] OR;  Service: General;  Laterality: N/A;   HPI:  This is a pleasant 79 year old female admitted to [facility], hospital on 03/07/2012 with diverticulitis who eventually  required sigmoid resection and colostomy on 03/12/2012. On approximately 03/18/2012 the patient developed slurred speech and intermittent confusion and hallucinations consistent with delirium. This was also accompanied by insomnia. Her symptoms have progressed despite medical therapy with multiple different medications. A CT scan of the head on 03/22/2012 was negative for acute abnormalities but pt will also have MRI. Pt also with new right LL pna suspicious for aspiration. Pt on a clear liquid diet. .   Assessment / Plan / Recommendation Clinical Impression  Pt demonstrates an acute respiratory  based dysphagia. Pt very short of breath, struggles to coordinate timing of breath swallow cycle. Over evidence of aspriation present with most trisl. Also concern for an esopahgeal related issue as pt expectorates phlegm after 255 of swallows. At this time do not feel pt is safe to consume POs. Recommend she be made NPO except pills whole or crushed in puree. Pt may also ahve ice chips after oral care. Pt has lingual blisters warranting aggressive oral care. Plan for possible MBS tomorrow to evaluate swallow function. Pt unlikely to participate well today given restless/combative behavior and probable need for sedation for MRI happening also this am. Will check on pt in am tomorrow. Family in agreement with plan.     Aspiration Risk  Severe    Diet Recommendation NPO except meds;Ice chips PRN after oral care   Medication Administration: Whole meds with puree    Other  Recommendations Recommended Consults: MBS Oral Care Recommendations: Oral care QID   Follow Up Recommendations       Frequency and Duration        Pertinent Vitals/Pain NA    SLP Swallow Goals     Swallow Study Prior Functional Status       General HPI: This is a pleasant 79 year old female admitted to [place], hospital on 03/07/2012 with diverticulitis who eventually  required sigmoid resection and colostomy on 03/12/2012. On approximately 03/18/2012 the patient developed slurred speech and intermittent confusion and hallucinations consistent with delirium. This was also accompanied by insomnia. Her symptoms have progressed despite medical therapy with multiple different medications. A CT scan of the  head on 03/22/2012 was negative for acute abnormalities but pt will also have MRI. Pt also with new right LL pna suspicious for aspiration. Pt on a clear liquid diet. . Type of Study: Bedside swallow evaluation Diet Prior to this Study: Thin liquids Temperature Spikes Noted: No Respiratory Status: Supplemental O2 delivered  via (comment) History of Recent Intubation: No Behavior/Cognition: Lethargic;Cooperative;Confused Oral Cavity - Dentition: Dentures, top;Dentures, bottom Self-Feeding Abilities: Total assist (attempts to feed herself, but discoordinated and confused) Patient Positioning: Upright in bed Baseline Vocal Quality: Breathy;Aphonic;Hoarse;Low vocal intensity Volitional Cough: Weak Volitional Swallow: Unable to elicit    Oral/Motor/Sensory Function Overall Oral Motor/Sensory Function: Impaired (generalized weakness)   Ice Chips Ice chips: Impaired Presentation: Spoon Oral Phase Functional Implications: Prolonged oral transit Pharyngeal Phase Impairments: Suspected delayed Swallow;Decreased hyoid-laryngeal movement   Thin Liquid Thin Liquid: Impaired Presentation: Cup;Straw Oral Phase Impairments: Impaired anterior to posterior transit Oral Phase Functional Implications: Prolonged oral transit Pharyngeal  Phase Impairments: Suspected delayed Swallow;Decreased hyoid-laryngeal movement;Multiple swallows;Throat Clearing - Immediate;Cough - Immediate    Nectar Thick Nectar Thick Liquid: Impaired Presentation: Cup;Straw Oral Phase Impairments: Reduced labial seal Oral phase functional implications: Prolonged oral transit Pharyngeal Phase Impairments: Suspected delayed Swallow;Decreased hyoid-laryngeal movement;Throat Clearing - Immediate;Cough - Immediate   Honey Thick Honey Thick Liquid: Not tested   Puree Puree: Impaired Presentation: Spoon Oral Phase Impairments: Impaired anterior to posterior transit Oral Phase Functional Implications: Prolonged oral transit Pharyngeal Phase Impairments: Suspected delayed Swallow;Decreased hyoid-laryngeal movement;Throat Clearing - Delayed   Solid   GO    Solid: Not tested      Harlon Ditty, MA CCC-SLP [phone]  Claudine Mouton 03/24/2012,9:50 AM

## 2012-03-24 NOTE — Progress Notes (Addendum)
PARENTERAL NUTRITION CONSULT NOTE  Pharmacy Consult for TPN (Day # 8) Indication: Prolonged ileus   Allergies  Allergen Reactions  . Indomethacin Anaphylaxis and Other (See Comments)    "took it fine for awhile; one day I stopped breathing and I ended up in the hospital" (01/02/2012)    Patient Measurements: Height: 5\' 1"  (154.9 cm) Weight: 144 lb 14.4 oz (65.726 kg) IBW/kg (Calculated) : 47.8 Adjusted Body Weight: 53.2 Usual Weight: 65.7  Vital Signs: Temp: 98.9 F (37.2 C) (03/24 0418) BP: 124/80 mmHg (03/24 0600) Pulse Rate: 88 (03/24 0600) Intake/Output from previous day: 03/23 0701 - 03/24 0700 In: 906.8 [TPN:906.8] Out: 3875 [Urine:3675; Stool:200] Intake/Output from this shift: Total I/O In: -  Out: 250 [Urine:250]  Labs:  Recent Labs  03/22/12 1715 03/23/12 0505 03/24/12 0635  WBC 11.0* 9.7 9.5  HGB 11.4* 11.8* 11.1*  HCT 33.9* 32.8* 32.9*  PLT 306 328 300     Recent Labs  03/22/12 0540 03/23/12 0505 03/24/12 0635  NA 139 137 139  K 4.0 3.7 3.7  CL 97 97 100  CO2 34* 31 30  GLUCOSE 122* 135* 143*  BUN 22 21 24*  CREATININE 0.60 0.62 0.65  CALCIUM 9.0 9.1 8.8  MG 2.0  --  2.2  PHOS  --   --  3.6  PROT  --   --  6.3  ALBUMIN  --   --  2.7*  AST  --   --  23  ALT  --   --  16  ALKPHOS  --   --  64  BILITOT  --   --  0.6  TRIG  --   --  89  CHOL  --   --  114   Estimated Creatinine Clearance: 45.5 ml/min (by C-G formula based on Cr of 0.65).    Recent Labs  03/23/12 2037 03/24/12 0241 03/24/12 0752  GLUCAP 140* 138* 141*    Medical History: Past Medical History  Diagnosis Date  . Hypertension   . Goiter     radioactive iodine ablation/notes 01/02/2012  . COPD (chronic obstructive pulmonary disease)   . CHF (congestive heart failure)   . Pneumonia ?01/02/2012; ? 2009  . SOB (shortness of breath)     'all the time" (01/02/2012)  . Hypothyroidism     "I have taken Synthroid before" (01/02/2012)  . Arthritis     "right shoulder"  (01/02/2012)  . Diverticulitis     Medications:  Scheduled:  . acetaminophen  500 mg Oral TID  . antiseptic oral rinse  15 mL Mouth Rinse q12n4p  . chlorhexidine  15 mL Mouth Rinse BID  . enoxaparin (LOVENOX) injection  40 mg Subcutaneous Q24H  . furosemide  40 mg Intravenous Daily  . [COMPLETED] haloperidol lactate  2 mg Intravenous Once  . lisinopril  20 mg Oral Daily  . [COMPLETED] LORazepam      . [COMPLETED] LORazepam  0.5 mg Intravenous Once  . [COMPLETED] LORazepam  1 mg Intravenous Once  . piperacillin-tazobactam (ZOSYN)  IV  3.375 g Intravenous Q8H  . polyethylene glycol  17 g Oral Daily  . QUEtiapine  25 mg Oral BID  . [DISCONTINUED] cyclobenzaprine  5 mg Oral BID    Insulin Requirements in the past 24 hours: SSI discontinued  Current Nutrition:  To start clear liquids today - neuro status still unclear; Clinimix E5/20 at 65 ml/hr (goal, provides 78g protein per day and an average of ~1580 kcal/day)  Nutritional Goals:  1640-1820 kCal, 70-80 grams of protein per day  Assessment: Admitted 3/7 with abdominal pain x 1 week.  She has lost 30lb in last 15 months due to abdominal pain.  Now s/p sigmoid resection, Hartman's pouch, & end colostomy for smoldering diverticulitis; prolonged ileus.    IV Pump:  Verified rate on IV pump, running correctly without noted complications.  GI:  POD#12, prolonged ileus - now resolving per MD note.  Now passing stool into her ostomy.  Difficulties with PO intake - poor appetite, possible aspiration, delirium.  Endo:  No hx DM.  CBGs have been well-controlled and < 150, her SSI coverage was discontinued on 3/20.  Hx goiter, radioiodine tx , on no thyroid replacement pta.    Lytes:  K is slightly below goal.  Goal K > 4 and Mg > 2 with ileus.  IVF: NS at [facility].  Renal:  Cr stable at 0.6, continues to have stable UOP  Pulm:  Hx COPD, not wheezing.  1L [place]  Cards:  Hx HTN, CHF.  VSS.  Lisinopril, Lasix 40mg  IV qday for volume  overload.  Hepatobil:   LFTs wnl on 3/20.  Cholesterol and triglycerides are fine.  Neuro: No major issues.  Vicodin for pain control.  ID:   Aspiration PNA - Day #5 Zosyn.  AFeb, WBC 9.7; s/p Invanz 3/7>3/18 for Diverticulitis & abscess.  Best Practices:  Lovenox VTE px TPN Access:  PICC for TNA TPN day#: 8  Plan:  Continue Clinimix E 5/20 at 65 ml/hr. Provide lipids, MVI, and trace elements on Mon-Wed-Fri only due to national backorder. Continue NS at [facility] TPN labs in AM Follow-up tolerance and advancement of diet for potential discontinuation of TPN.    Nadara Mustard, PharmD., MS Clinical Pharmacist Pager:  [phone] Thank you for allowing pharmacy to be part of this patients care team. 03/24/2012, 9:13 AM

## 2012-03-24 NOTE — Progress Notes (Signed)
Patient ID: [name], female   DOB: Jul 09, 1925, 79 y.o.   MRN: [id number] 12 Days Post-Op  Subjective: Pt still confused and agitated at time.  No nausea.   Objective: Vital signs in last 24 hours: Temp:  [98.1 F (36.7 C)-98.9 F (37.2 C)] 98.9 F (37.2 C) (03/24 0418) Pulse Rate:  [73-88] 88 (03/24 0600) Resp:  [20] 20 (03/23 1350) BP: (113-130)/(67-82) 124/80 mmHg (03/24 0600) SpO2:  [93 %-98 %] 98 % (03/24 0418) Last BM Date: 03/23/12  Intake/Output from previous day: 03/23 0701 - 03/24 0700 In: 906.8 [TPN:906.8] Out: 3875 [Urine:3675; Stool:200] Intake/Output this shift: Total I/O In: -  Out: 250 [Urine:250]  PE: Abd: soft, still with some distention, but better.  Some BS, ostomy with air and some liquid stool.  Difficulty seeing stoma due to one piece bag and stool over stoma, some pink mucosa visible.  Incision c/d/i with staples Lungs: CTAB, but appears to be working to breath at times Lab Results:   Recent Labs  03/23/12 0505 03/24/12 0635  WBC 9.7 9.5  HGB 11.8* 11.1*  HCT 32.8* 32.9*  PLT 328 300   BMET  Recent Labs  03/23/12 0505 03/24/12 0635  NA 137 139  K 3.7 3.7  CL 97 100  CO2 31 30  GLUCOSE 135* 143*  BUN 21 24*  CREATININE 0.62 0.65  CALCIUM 9.1 8.8   PT/INR No results found for this basename: LABPROT, INR,  in the last 72 hours CMP     Component Value Date/Time   NA 139 03/24/2012 0635   K 3.7 03/24/2012 0635   CL 100 03/24/2012 0635   CO2 30 03/24/2012 0635   GLUCOSE 143* 03/24/2012 0635   BUN 24* 03/24/2012 0635   CREATININE 0.65 03/24/2012 0635   CREATININE 0.70 01/14/2012 1330   CALCIUM 8.8 03/24/2012 0635   PROT 6.3 03/24/2012 0635   ALBUMIN 2.7* 03/24/2012 0635   AST 23 03/24/2012 0635   ALT 16 03/24/2012 0635   ALKPHOS 64 03/24/2012 0635   BILITOT 0.6 03/24/2012 0635   GFRNONAA 79* 03/24/2012 0635   GFRAA >90 03/24/2012 0635   Lipase  No results found for this basename: lipase       Studies/Results: Ct Head Wo  Contrast  03/22/2012  *RADIOLOGY REPORT*  Clinical Data: 79 year old female with altered mental status and confusion.  CT HEAD WITHOUT CONTRAST  Technique:  Contiguous axial images were obtained from the base of the skull through the vertex without contrast.  Comparison: None  Findings: Chronic small vessel white matter ischemic changes are present. A remote right occipital/posterior parietal infarct is present. No acute intracranial abnormalities are identified, including mass lesion or mass effect, hydrocephalus, extra-axial fluid collection, midline shift, hemorrhage, or acute infarction.  The visualized bony calvarium is unremarkable.  IMPRESSION: No evidence of acute intracranial abnormality.  Chronic small vessel white matter ischemic changes and remote right occipital/posterior parietal infarct.   Original Report Authenticated By: Harmon Pier, M.D.    Dg Chest Port 1 View  03/23/2012  *RADIOLOGY REPORT*  Clinical Data: Shortness of breath.  PORTABLE CHEST - 1 VIEW  Comparison:   the previous day's study  Findings: Right arm PICC stable.  Coarse bilateral calcifications, presumably thyroid, at the thoracic inlet with possible tracheal narrowing. Heart size upper limits normal.  Lungs are clear.  No effusion.  IMPRESSION:  1.  Stable mild cardiomegaly. 2.  Suspect goiter. In the absence of any prior studies, consider elective outpatient thyroid ultrasound for  further characterization.   Original Report Authenticated By: D. Andria Rhein, MD    Dg Abd Acute W/chest  03/22/2012  *RADIOLOGY REPORT*  Clinical Data: Abdominal pain  ACUTE ABDOMEN SERIES (ABDOMEN 2 VIEW & CHEST 1 VIEW)  Comparison: In the 03/21/2012  Findings: Right arm PICC tip in the SVC, unchanged.  Right lower lobe airspace disease again noted and unchanged.  There is mild left lower lobe atelectasis which has increased slightly.  This could be due to atelectasis or pneumonia.  Mild vascular congestion without edema.  Calcified goiter.   Advanced degenerative change right shoulder  Dilated small bowel loops with air-fluid levels, with mild improvement since the prior study.  Colon is decompressed.  No free air on the decubitus view.  IMPRESSION: Bibasilar airspace disease, with mild worsening on the left. Question atelectasis versus pneumonia.  Dilated small bowel with air-fluid levels, with mild improvement. This is most compatible with small bowel obstruction.   Original Report Authenticated By: Janeece Riggers, M.D.    Dg Abd Portable 1v  03/23/2012  *RADIOLOGY REPORT*  Clinical Data: Small bowel obstruction.  PORTABLE ABDOMEN - 1 VIEW  Comparison:   the previous day's study  Findings: Persistent dilated   small bowel loops in the mid and lower abdomen.  The colon appears decompressed.  The stomach is nondilated.  Midline skin staples.  Degenerative changes in the lower lumbar spine.  Visualized lung bases clear.  IMPRESSION:  1.  Little change in degree of small bowel dilatation.   Original Report Authenticated By: D. Andria Rhein, MD     Anti-infectives: Anti-infectives   Start     Dose/Rate Route Frequency Ordered Stop   03/20/12 0900  piperacillin-tazobactam (ZOSYN) IVPB 3.375 g     3.375 g 12.5 mL/hr over 240 Minutes Intravenous 3 times per day 03/20/12 0839     03/08/12 1900  ertapenem (INVANZ) 1 g in sodium chloride 0.9 % 50 mL IVPB  Status:  Discontinued     1 g 100 mL/hr over 30 Minutes Intravenous Every 24 hours 03/08/12 1408 03/19/12 1106   03/07/12 1800  ertapenem (INVANZ) 1 g in sodium chloride 0.9 % 50 mL IVPB     1 g 100 mL/hr over 30 Minutes Intravenous To Emergency Dept 03/07/12 1721 03/07/12 1955   03/07/12 1730  ertapenem (INVANZ) 1 g in sodium chloride 0.9 % 50 mL IVPB  Status:  Discontinued     1 g 100 mL/hr over 30 Minutes Intravenous  Once 03/07/12 1716 03/07/12 1717       Assessment/Plan  1. S/p Hartman's for diverticulitis 2. Prolonged post op ileus 3. Confusion/agitation 4. SOB 5. H/o CHF 6.  PNA 7. PCM/TNA  Plan:  1. Will try clear liquids as patient seems better this morning.  If she fails this, she needs a CT scan to rule out intra-abdominal abscess.  She has a normal WBC with no fevers currently 2. Suspect some of her delirium is unfortunately, typical for an elderly patient s/p general anaesthesia.  Further care per primary and neuro. 3. SOB - lungs are clear and x-ray improved; however, at times she seems to be working fairly hard to breath.  Unsure of reason. 4. Patient needs to continue to mobilize and pulm toilet 5. Cont tNA while not taking in much orally  LOS: 17 days    Myrene Bougher E 03/24/2012, 9:14 AM Pager: [phone]

## 2012-03-24 NOTE — Consult Note (Signed)
Patient Identification:  [name] Date of Evaluation:  03/24/2012 Reason for Consult:  Progressive Delirium  Referring Provider: Dr. Isidoro Donning  History of Present Illness:[name] is a 79 yo woman with prior h/o COPD hypertension, recent admission for diverticulitis.  She was sent home on oral antibiotics.  She returned in one week with abd. Pain.  CT of abdomen revealed persistent diverticulitis and small abscess.  Surgery consult recommended conservative treatment with antibiotics.  She was admitted for IV antibiotic treatment.     Past Psychiatric History: Per son's information.  There is no prior psychiatric history.  Son reports mother [pt] as active, doing ADLs, reading socializing going to church and activities.  No hx of alcohol or drugs given.  Past Medical History:     Past Medical History  Diagnosis Date  . Hypertension   . Goiter     radioactive iodine ablation/notes 01/02/2012  . COPD (chronic obstructive pulmonary disease)   . CHF (congestive heart failure)   . Pneumonia ?01/02/2012; ? 2009  . SOB (shortness of breath)     'all the time" (01/02/2012)  . Hypothyroidism     "I have taken Synthroid before" (01/02/2012)  . Arthritis     "right shoulder" (01/02/2012)  . Diverticulitis        Past Surgical History  Procedure Laterality Date  . Cataract extraction w/ intraocular lens  implant, bilateral  ?1990's  . Appendectomy  1980's    "when they did hysterectomy" (01/02/2012)  . Abdominal hysterectomy  1980's  . Colostomy revision N/A 03/12/2012    Procedure: COLON RESECTION SIGMOID ;  Surgeon: Axel Filler, MD;  Location: [facility] OR;  Service: General;  Laterality: N/A;  . Colostomy N/A 03/12/2012    Procedure: COLOSTOMY;  Surgeon: Axel Filler, MD;  Location: [facility] OR;  Service: General;  Laterality: N/A;    Allergies:  Allergies  Allergen Reactions  . Indomethacin Anaphylaxis and Other (See Comments)    "took it fine for awhile; one day I stopped breathing and I ended up in  the hospital" (01/02/2012)    Current Medications:  Prior to Admission medications   Medication Sig Start Date End Date Taking? Authorizing Provider  albuterol-ipratropium (COMBIVENT) 18-103 MCG/ACT inhaler Inhale 2 puffs into the lungs every 6 (six) hours as needed for wheezing. For shortness of breath   Yes Historical Provider, MD  amoxicillin-clavulanate (AUGMENTIN) 500-125 MG per tablet Take 1 tablet (500 mg total) by mouth 3 (three) times daily. 02/12/12  Yes Doristine Mango, PA-C  aspirin 81 MG chewable tablet Chew 81 mg by mouth daily.   Yes Historical Provider, MD  HYDROcodone-acetaminophen (NORCO) 5-325 MG per tablet Take 1 tablet by mouth every 6 (six) hours as needed for pain. 02/12/12  Yes Doristine Mango, PA-C  lisinopril (PRINIVIL,ZESTRIL) 10 MG tablet Take 10 mg by mouth.   Yes Historical Provider, MD  metroNIDAZOLE (FLAGYL) 500 MG tablet Take 500 mg by mouth 3 (three) times daily.   Yes Historical Provider, MD  furosemide (LASIX) 20 MG tablet Take 20 mg by mouth daily.    Historical Provider, MD  Vitamin D, Ergocalciferol, (DRISDOL) 50000 UNITS CAPS Take 50,000 Units by mouth every 14 (fourteen) days.    Historical Provider, MD    Social History:    reports that she quit smoking about 21 years ago. Her smoking use included Cigarettes. She has a 4.2 pack-year smoking history. She has never used smokeless tobacco. She reports that she does not drink alcohol or use illicit  drugs.  Family History:    Family History  Problem Relation Age of Onset  . Heart disease    . Cancer Sister     Breast   Mental Status Examination   Appearance:  Prone in bed.  Eyes closed, Calm muttering      sounds non-stop.  Eye Contact:     NA  peech:    Rambling mumbling Volume:    Normal Mood:    Delirious Affect:     Blunt  Thought Process:    NA Orientation: Pt states her name, DOB,  "[place]" Thought content:    NA Suicidal Ideation:    NA Homicidal Ideation:     NA Judgment:        NA Insight:     NA  DIAGNOSIS: AXIS I    Delirium due to medical condition AXIS II   Deferred AXIS III  See medical record AXIS IV  Conditions related to medical conditions,  AXIS V   GAF  51-60 serious conditions  Assessment/Plan:  Discussed with Dr. Isidoro Donning, son, [name] [phone] Pt responds to name, know DOB, and that she is at [facility].  She is rambling unintelligible words non-stop when not addressed.  She has claimed she needs a bedpan.  At times she can make her needs known.  Noted:  She was taken to MRI - it was cancelled that she was unable to remain still. Son provides hx that he had met with pt, mom, every morning ~ 2 hours.  She has been cognitively intact, maintains her house, does cross-word puzzles.  She has friends, goes to activities and attends church.  This phase of delirium is very atypical of known patient's behavior.  Agree with DC of tramadol, flexeril     It is known pt has not been able to sleep well, if at all, and delirium has evolved  with symptoms since Sat. 03/21/12.  Repeaat MRI is pending.    Pt may benefit from IV SGA Geodon for agitation and to promote sleep. She has NSR with QTc wnl.  RECOMMENDATION:  1. Pt does not have capacity; active delirium 2.  Suggest Geodon, 10 mg IV prn agitation, especially before bedtime to promote sleep.  QTc reported within normal limits.  3.  Will follow. Mickeal Skinner MD 03/24/2012 12:16 PM

## 2012-03-24 NOTE — Progress Notes (Signed)
Patient continues to have labored breathing and expiratory wheezing.  Vital signs stable, O2 2L on and O2 stats 95-100%.  Patient continues to be confused and unaware of surroundings.  Sitter at bedside.  Dr. Isidoro Donning notified of patient staus.  Orders received to transfer to step down.  Will continue to monitor.

## 2012-03-24 NOTE — Progress Notes (Signed)
Pt arrived from 6N with sitter, family oriented to unit and safety discussed

## 2012-03-24 NOTE — Progress Notes (Signed)
Subjective: Little sleep overnight. Still confused.   Exam: Filed Vitals:   03/24/12 1520  BP: 103/45  Pulse: 79  Temp:   Resp: 26   Gen: In bed, NAD MS: Responds to unseen visual stimuli while in room. Follwos commands. Very dysarthria.  CN: fixates and tracks,  Motor: moves all 4 ext to command Sensory: reacts to stim x 4   Impression: 79 yo F with AMS in the setting of infection, l strongly suspect delirium due ot general medical condiiton, prolonged hospitalization, lack of sleep. An MRI when possible would be helpful in the workup, but she is not able to obtain it currently due to uncooperation.   Recommendations: 1) Could increase  neuroleptic to help restore sleep/wake cycle. Psychiatry has been consulted, and will defer to their recommendations.  2) MRI brain when possible.   Ritta Slot, MD [facility] [phone]  If 7pm- 7am, please page neurology on call at [phone].

## 2012-03-24 NOTE — Consult Note (Signed)
WOC ostomy follow-up consult  Stoma type/location: Colostomy to left lower quad Stomal assessment/size: 11/4 inches, red and viable, flush with skin level Peristomal assessment: Intact skin surrounding Output 50cc liquid brwon stool Ostomy pouching: 1pc. with barrier ring.  Education provided: Pt very confused and agitated.  Unable to participate in pouch change demonstration.  Son at bedside to watch process.  Pt will need total assistance with pouch application and emptying in any setting outside of the hospital.  Applied one piece pouch with barrier ring to maintain seal.  Supplies at bedside for staff use. Barnetta Chapel, CCS PA in at bedside to assess stoma. Cammie Mcgee MSN, RN, CWOCN, [place], CNS [phone]

## 2012-03-24 NOTE — Progress Notes (Signed)
03/24/12 1300  PT Visit Information  Last PT Received On 03/24/12  Reason Eval/Treat Not Completed Medical issues which prohibited therapy; Pt with respiratory distress, has been agitated all am; Pt now calm but about to transfer to step-down;   Defer PT today.

## 2012-03-24 NOTE — Progress Notes (Signed)
NUTRITION FOLLOW UP  Intervention:   1.  Parenteral nutrition; ongoing management per PharmD.  Current regimen meets 100% of estimated kcal and protein needs.  2.  Diet advancement; per MD discretion once medically appropriate.   Nutrition Dx:   Inadequate oral intake, ongoing  Monitor:   Toleration of TPN, weight trends, labs   Assessment:   Pt admitted with abdominal pain, found to have recurrent flare-up of diverticulitis with small prediverticular abscess which is recurring. She has lost 30lb in last 15 months due to abdominal pain. Now s/p sigmoid resection, Hartman's pouch, & end colostomy for smoldering diverticulitis; prolonged ileus.  Pt is POD#7.   Continues Clinimix E5/20 at 65 ml/hr (goal, provides 78g protein per day and an average of ~1580 kcal/day).  Pt now with increased confusion and agitation.  Pt planning to transfer to SDU.  Labs reviewed, K, Mg, and Phos wnl. Height: Ht Readings from Last 1 Encounters:  03/14/12 5\' 1"  (1.549 m)    Weight Status:   Wt Readings from Last 1 Encounters:  03/14/12 144 lb 14.4 oz (65.726 kg)    Re-estimated needs:  Kcal: 1500-1700 Protein: 75-90 g Fluid: 1.5 L/day  Skin: Left lower quad colostomy  Diet Order: NPO   Intake/Output Summary (Last 24 hours) at 03/24/12 0959 Last data filed at 03/24/12 0900  Gross per 24 hour  Intake 906.75 ml  Output   4125 ml  Net -3218.25 ml    Last BM: 3/18   Labs:   Recent Labs Lab 03/18/12 0557  03/20/12 0540  03/22/12 0540 03/23/12 0505 03/24/12 0635  NA 146*  < > 141  < > 139 137 139  K 3.6  < > 3.6  < > 4.0 3.7 3.7  CL 111  < > 102  < > 97 97 100  CO2 29  < > 33*  < > 34* 31 30  BUN 17  < > 20  < > 22 21 24*  CREATININE 0.51  < > 0.42*  < > 0.60 0.62 0.65  CALCIUM 8.5  < > 8.4  < > 9.0 9.1 8.8  MG 2.2  --  1.9  --  2.0  --  2.2  PHOS 3.5  --  3.2  --   --   --  3.6  GLUCOSE 154*  < > 142*  < > 122* 135* 143*  < > = values in this interval not displayed.  CBG  (last 3)   Recent Labs  03/23/12 2037 03/24/12 0241 03/24/12 0752  GLUCAP 140* 138* 141*    Lipid Panel     Component Value Date/Time   CHOL 114 03/24/2012 0635   TRIG 89 03/24/2012 0635   HDL 20* 01/03/2007 0335   CHOLHDL 6.4 01/03/2007 0335   VLDL 10 01/03/2007 0335   LDLCALC  Value: 98        Total Cholesterol/HDL:CHD Risk Coronary Heart Disease Risk Table                     Men   Women  1/2 Average Risk   3.4   3.3  Average Risk       5.0   4.4  2 X Average Risk   9.6   7.1  3 X Average Risk  23.4   11.0        Use the calculated Patient Ratio above and the CHD Risk Table to determine the patient's CHD Risk.  ATP III CLASSIFICATION (LDL):  <100     mg/dL   Optimal  409-811  mg/dL   Near or Above                    Optimal  130-159  mg/dL   Borderline  914-782  mg/dL   High  >956     mg/dL   Very High 02/02/3084 5784    Scheduled Meds: . acetaminophen  500 mg Oral TID  . antiseptic oral rinse  15 mL Mouth Rinse q12n4p  . chlorhexidine  15 mL Mouth Rinse BID  . enoxaparin (LOVENOX) injection  40 mg Subcutaneous Q24H  . furosemide  40 mg Intravenous Daily  . lisinopril  20 mg Oral Daily  . piperacillin-tazobactam (ZOSYN)  IV  3.375 g Intravenous Q8H  . polyethylene glycol  17 g Oral Daily  . QUEtiapine  25 mg Oral BID    Continuous Infusions: . sodium chloride 20 mL/hr at 03/21/12 2140  . Marland KitchenTPN (CLINIMIX-E) Adult     And  . fat emulsion    . Marland KitchenTPN (CLINIMIX-E) Adult 65 mL/hr at 03/23/12 1704    Loyce Dys, MS RD LDN Clinical Inpatient Dietitian Pager: [phone] Weekend/After hours pager: [phone]

## 2012-03-24 NOTE — Progress Notes (Signed)
Rapid response at bedside to assess patient.

## 2012-03-24 NOTE — Progress Notes (Signed)
Patient ID: [name]  female  NWG:[id number]    DOB: 01/09/1925    DOA: 03/07/2012  PCP: Kaleen Mask, MD  Assessment/Plan:  Acute delirium: Unclear etiology, patient is hallucinating at the time of my examination. Possibility of an residual anesthesia effect, narcotics (not any more), tramadol and flexeril (DC'ed), lack of sleep (per family, she has not slept for several days), wheezing  - Stat ABG did not show any CO2 narcosis, will give extra dose of Lasix as patient has some hypoxia and increased WOB, will check BNP - Place her back on scheduled albuterol and Atrovent nebs  - Checked the ammonia level, 13 - Called psychiatry consultation to assist with acute delirium  - Unfortunately unable to do MRI due to patient's uncooperativeness - Due to increased work of breathing and ac encepahlopathy, will transfer to SDU for close monitoring   Recurrent acute sigmoid diverticulitis with intra-abdominal abscess:  Prolonged post-op ileus. POD#12 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  -Currently n.p.o. due to her mental status, on TPN  - CCS closely following   Hypertension: cont lisinopril   History of Chronic diastolic CHF (congestive heart failure): fluid overloaded after starting TPN, had been on IVF BNP 3342 on 3/19. Still positive balance but has improved  -  EF 55-60% based on 2D Echo on 02/25/2009, repeat ECHO -  cont IV lasix, with one dose extra, nebs, O2  Right lower lobe pneumonia, likely aspiration, continue IV Zosyn  - Currently n.p.o. status due to her worsening mental status    H/O: hypothyroidism - Stable, last TSH 1/14 normal, the patient is not on any replacement  History of COPD with acute bronchitis - placed on alb and atrovent nebs scheduled  DVT Prophylaxis: lovenox  Code Status:  Disposition: Transfer to the step down unit for closer monitoring, updated and discussed with patient's son at the  bedside.   Subjective: Hallucinating and talking gibberish  Objective: Weight change:   Intake/Output Summary (Last 24 hours) at 03/24/12 1327 Last data filed at 03/24/12 1218  Gross per 24 hour  Intake 1018.75 ml  Output   4125 ml  Net -3106.25 ml   Blood pressure 139/67, pulse 67, temperature 98.9 F (37.2 C), temperature source Oral, resp. rate 20, height 5\' 1"  (1.549 m), weight 65.726 kg (144 lb 14.4 oz), SpO2 100.00%.  Physical Exam: General: Confused not responding to verbal commands, hallucinating CVS: S1-S2 clear Chest: Increased work of breathing, bilateral expiratory wheezing Abdomen:  Dressing intact, colostomy with output Extremities: no c/c/e   Lab Results: Basic Metabolic Panel:  Recent Labs Lab 03/23/12 0505 03/24/12 0635  NA 137 139  K 3.7 3.7  CL 97 100  CO2 31 30  GLUCOSE 135* 143*  BUN 21 24*  CREATININE 0.62 0.65  CALCIUM 9.1 8.8  MG  --  2.2  PHOS  --  3.6   Liver Function Tests:  Recent Labs Lab 03/20/12 0540 03/24/12 0635  AST 16 23  ALT 9 16  ALKPHOS 57 64  BILITOT 0.4 0.6  PROT 5.3* 6.3  ALBUMIN 2.3* 2.7*   CBC:  Recent Labs Lab 03/23/12 0505 03/24/12 0635  WBC 9.7 9.5  NEUTROABS  --  7.3  HGB 11.8* 11.1*  HCT 32.8* 32.9*  MCV 89.4 88.9  PLT 328 300   Cardiac Enzymes: No results found for this basename: CKTOTAL, CKMB, CKMBINDEX, TROPONINI,  in the last 168 hours BNP: No components found with this basename: POCBNP,  CBG:  Recent Labs Lab 03/23/12 1200 03/23/12 2037 03/24/12 0241 03/24/12 0752 03/24/12 1158  GLUCAP 134* 140* 138* 141* 137*     Micro Results: Recent Results (from the past 240 hour(s))  URINE CULTURE     Status: None   Collection Time    03/19/12 10:21 AM      Result Value Range Status   Specimen Description URINE, CLEAN CATCH   Final   Special Requests NONE   Final   Culture  Setup Time 03/19/2012 12:09   Final   Colony Count NO GROWTH   Final   Culture NO GROWTH   Final   Report  Status 03/20/2012 FINAL   Final    Studies/Results: Ct Abdomen Pelvis W Contrast  03/07/2012  *RADIOLOGY REPORT*  Clinical Data: Lower abdominal pain, history diverticulitis, CHF, COPD  CT ABDOMEN AND PELVIS WITH CONTRAST  Technique:  Multidetector CT imaging of the abdomen and pelvis was performed following the standard protocol during bolus administration of intravenous contrast. Sagittal and coronal MPR images reconstructed from axial data set.  Contrast: 80mL OMNIPAQUE IOHEXOL 300 MG/ML  SOLN Dilute oral contrast.  Comparison: 02/07/2012  Findings: Minimal atelectasis posterior right lower lobe base. Improved atelectasis left lower lobe. Mitral annular calcification noted. Bilateral renal cysts. Liver, spleen, pancreas, with kidneys, and adrenal glands otherwise normal appearance.  Stomach incompletely distended, suboptimal assessment wall thickness. Small umbilical hernia containing fat. Diverticulosis of descending and sigmoid colon. Wall thickening identified at mid sigmoid colon with improved pericolic inflammatory changes compatible with acute diverticulitis. Extraluminal gas collections medial to the thickened sigmoid segment again seen compatible with contained perforation within the mesocolon. The presence of minimal high attenuation within this collection raises the possibility that this collection demonstrates continued communication with the adjacent sigmoid colon lumen.  No discrete drainable fluid/abscess collection identified. No free intraperitoneal air. Small bowel loops and remainder colon showed no additional acute changes. Scattered atherosclerotic calcifications. No mass, adenopathy, free to free of fluid, or hernia. Bones diffusely demineralized.  IMPRESSION: Persistent acute diverticulitis changes of the sigmoid colon with wall thickening and further improved pericolic infiltrative changes. Persistent visualization of extraluminal gas collections within the sigmoid mesocolon at site of  prior diverticular abscess, containing a small amount of central high attenuation which could represent GI contrast and indicate continued communication of this collection with the sigmoid lumen. Diverticulosis of sigmoid and descending colon.   Original Report Authenticated By: Ulyses Southward, M.D.     Medications: Scheduled Meds: . acetaminophen  500 mg Oral TID  . albuterol  2.5 mg Nebulization Q6H  . antiseptic oral rinse  15 mL Mouth Rinse q12n4p  . chlorhexidine  15 mL Mouth Rinse BID  . enoxaparin (LOVENOX) injection  40 mg Subcutaneous Q24H  . furosemide  40 mg Intravenous Daily  . furosemide  40 mg Intravenous Once  . ipratropium  0.5 mg Nebulization Q6H  . lisinopril  20 mg Oral Daily  . piperacillin-tazobactam (ZOSYN)  IV  3.375 g Intravenous Q8H  . polyethylene glycol  17 g Oral Daily      LOS: 17 days   Jerid Catherman M.D. [facility] 03/24/2012, 1:27 PM Pager: [phone]  If 7PM-7AM, please contact night-coverage [email or web address] Password [email or web address]

## 2012-03-25 DIAGNOSIS — I517 Cardiomegaly: Secondary | ICD-10-CM

## 2012-03-25 LAB — GLUCOSE, CAPILLARY
Glucose-Capillary: 137 mg/dL — ABNORMAL HIGH (ref 70–99)
Glucose-Capillary: 140 mg/dL — ABNORMAL HIGH (ref 70–99)
Glucose-Capillary: 125 mg/dL — ABNORMAL HIGH (ref 70–99)
Glucose-Capillary: 130 mg/dL — ABNORMAL HIGH (ref 70–99)
Glucose-Capillary: 133 mg/dL — ABNORMAL HIGH (ref 70–99)

## 2012-03-25 MED ORDER — STARCH (THICKENING) PO POWD
ORAL | Status: DC | PRN
  Filled 2012-03-25: qty 227

## 2012-03-25 MED ORDER — RESOURCE THICKENUP CLEAR PO POWD
ORAL | Status: DC | PRN
  Filled 2012-03-25: qty 125

## 2012-03-25 MED ORDER — CLINIMIX E/DEXTROSE (5/20) 5 % IV SOLN
INTRAVENOUS | Status: AC
  Administered 2012-03-25: 18:00:00 via INTRAVENOUS
  Filled 2012-03-25: qty 2000

## 2012-03-25 NOTE — Progress Notes (Signed)
PARENTERAL NUTRITION CONSULT NOTE  Pharmacy Consult for TPN (Day # 9) Indication: Prolonged ileus   Allergies  Allergen Reactions  . Indomethacin Anaphylaxis and Other (See Comments)    "took it fine for awhile; one day I stopped breathing and I ended up in the hospital" (01/02/2012)    Patient Measurements: Height: 5\' 1"  (154.9 cm) Weight: 144 lb 14.4 oz (65.726 kg) IBW/kg (Calculated) : 47.8 Adjusted Body Weight: 53.2 Usual Weight: 65.7  Vital Signs: Temp: 98.4 F (36.9 C) (03/25 0736) Temp src: Oral (03/25 0736) BP: 107/57 mmHg (03/25 0355) Pulse Rate: 82 (03/25 0355) Intake/Output from previous day: 03/24 0701 - 03/25 0700 In: 1962 [I.V.:392; IV Piggyback:150; TPN:1420] Out: 1905 [Urine:1650; Stool:255] Intake/Output from this shift: Total I/O In: -  Out: 175 [Urine:175]  Labs:  Recent Labs  03/22/12 1715 03/23/12 0505 03/24/12 0635  WBC 11.0* 9.7 9.5  HGB 11.4* 11.8* 11.1*  HCT 33.9* 32.8* 32.9*  PLT 306 328 300     Recent Labs  03/23/12 0505 03/24/12 0635  NA 137 139  K 3.7 3.7  CL 97 100  CO2 31 30  GLUCOSE 135* 143*  BUN 21 24*  CREATININE 0.62 0.65  CALCIUM 9.1 8.8  MG  --  2.2  PHOS  --  3.6  PROT  --  6.3  ALBUMIN  --  2.7*  AST  --  23  ALT  --  16  ALKPHOS  --  64  BILITOT  --  0.6  PREALBUMIN  --  16.4*  TRIG  --  89  CHOL  --  114   Estimated Creatinine Clearance: 45.5 ml/min (by C-G formula based on Cr of 0.65).    Recent Labs  03/25/12 0004 03/25/12 0359 03/25/12 0740  GLUCAP 137* 140* 125*    Medical History: Past Medical History  Diagnosis Date  . Hypertension   . Goiter     radioactive iodine ablation/notes 01/02/2012  . COPD (chronic obstructive pulmonary disease)   . CHF (congestive heart failure)   . Pneumonia ?01/02/2012; ? 2009  . SOB (shortness of breath)     'all the time" (01/02/2012)  . Hypothyroidism     "I have taken Synthroid before" (01/02/2012)  . Arthritis     "right shoulder" (01/02/2012)  .  Diverticulitis     Medications:  Scheduled:  . acetaminophen  500 mg Oral TID  . albuterol  2.5 mg Nebulization Q6H  . antiseptic oral rinse  15 mL Mouth Rinse q12n4p  . chlorhexidine  15 mL Mouth Rinse BID  . enoxaparin (LOVENOX) injection  40 mg Subcutaneous Q24H  . furosemide  40 mg Intravenous Daily  . furosemide  40 mg Intravenous Once  . ipratropium  0.5 mg Nebulization Q6H  . [COMPLETED] LORazepam  0.5 mg Intravenous Once  . piperacillin-tazobactam (ZOSYN)  IV  3.375 g Intravenous Q8H  . polyethylene glycol  17 g Oral Daily  . [DISCONTINUED] lisinopril  20 mg Oral Daily  . [DISCONTINUED] QUEtiapine  25 mg Oral BID    Insulin Requirements in the past 24 hours: SSI discontinued and CBG's have remained within range.  Current Nutrition:  She remains NPO due to neuro status and risk for aspiration.  Clinimix E5/20 at 65 ml/hr (goal, provides 78g protein per day and an average of ~1580 kcal/day).  Her prealbumin is trending up 11.7 >> 16.4 and is just below desired goal.  Nutritional Goals:  1640-1820 kCal, 70-80 grams of protein per day  Assessment: Admitted  3/7 with abdominal pain x 1 week.  She has lost 30lb in last 15 months due to abdominal pain.  Now s/p sigmoid resection, Hartman's pouch, & end colostomy for smoldering diverticulitis; prolonged ileus.  She continues to have active delirium which is preventing advancement of diet.  IV Pump:  Verified rate on IV pump, running correctly without noted complications.  GI:  POD#13, prolonged ileus - now resolving per MD note.  Now passing stool into her ostomy.  Difficulties with PO intake - poor appetite, possible aspiration, delirium.  Endo:  No hx DM.  CBGs have been well-controlled and < 150, her SSI coverage was discontinued on 3/20.  Hx goiter, radioiodine tx , on no thyroid replacement pta.    Lytes:  No new labs today but electrolytes were ok yesterday.  K is slightly below goal.  Goal K > 4 and Mg > 2 with ileus.   IVF: NS at [facility]  Renal:  Cr stable at 0.6, continues to have stable UOP  Pulm:  Hx COPD, wheezing - now back on Atrovent/Albuterol nebs.  2L [place]  Cards:  Hx HTN, CHF.  VSS.  Lisinopril, Lasix 40mg  IV qday for volume overload.  Need to check potassium tomorrow.  Hepatobil:   LFTs wnl on 3/20.  Cholesterol and triglycerides are fine.  Neuro: Acute delirium - have now added Geodon prn.  Vicodin for pain control.  Monitor QTC  ID:   Aspiration PNA - Day #6 Zosyn.  AFeb, WBC 9.7; s/p Invanz 3/7>3/18 for Diverticulitis & abscess.  Best Practices:  Lovenox VTE px TPN Access:  PICC for TNA TPN day#: 9  Plan:  Continue Clinimix E 5/20 at 65 ml/hr. Provide lipids, MVI, and trace elements on Mon-Wed-Fri only due to national backorder. Continue NS at [facility] TPN labs in AM Follow-up tolerance and advancement of diet for potential discontinuation of TPN.    Nadara Mustard, PharmD., MS Clinical Pharmacist Pager:  [phone] Thank you for allowing pharmacy to be part of this patients care team. 03/25/2012, 8:21 AM

## 2012-03-25 NOTE — Progress Notes (Signed)
Speech Language Pathology Dysphagia Treatment Patient Details Name: [name] MRN: [id number] DOB: 29-Nov-1925 Today's Date: 03/25/2012 Time: 4098-1191 SLP Time Calculation (min): 22 min  Assessment / Plan / Recommendation Clinical Impression  Pts function much improved today. Pt fully alert, with adequate RR and oxygenation. No distress. SLP provided trials fo puree, thin and nectar thick liquids at bedside, pt did not display any overt signs of aspriation though there was still a slight discoordination of respiration and swallow responae resulting in subjective deleay in swallow initiation. A conservative diet is recommended to reduce aspiration risk as pt continues to recover function. Will defer objective test at this time and continue to follow for tolerance at bedside. Will write order for Clear liquid, nectar thick, diet, per surgery PA.     Diet Recommendation  Initiate / Change Diet: Nectar-thick liquid    SLP Plan Continue with current plan of care   Pertinent Vitals/Pain NA   Swallowing Goals  SLP Swallowing Goals Patient will consume recommended diet without observed clinical signs of aspiration with: Minimal assistance Patient will utilize recommended strategies during swallow to increase swallowing safety with: Minimal cueing Swallow Study Goal #2 - Progress: Progressing toward goal  General Temperature Spikes Noted: No Respiratory Status: Supplemental O2 delivered via (comment) Behavior/Cognition: Alert;Cooperative;Pleasant mood Oral Cavity - Dentition: Dentures, top;Dentures, bottom Patient Positioning: Upright in bed  Oral Cavity - Oral Hygiene Does patient have any of the following "at risk" factors?: Oxygen therapy - cannula, mask, simple oxygen devices Patient is AT RISK - Oral Care Protocol followed (see row info): Yes   Dysphagia Treatment Treatment focused on: Upgraded PO texture trials;Patient/family/caregiver education Treatment Methods/Modalities:  Skilled observation Patient observed directly with PO's: Yes Type of PO's observed: Dysphagia 1 (puree);Thin liquids;Nectar-thick liquids Feeding: Able to feed self Liquids provided via: Cup;Straw Oral Phase Signs & Symptoms: Prolonged oral phase Pharyngeal Phase Signs & Symptoms: Suspected delayed swallow initiation Type of cueing: Verbal Amount of cueing: Minimal   GO    Jacqueline Ditty, MA CCC-SLP [phone]  Claudine Mouton 03/25/2012, 9:12 AM

## 2012-03-25 NOTE — Progress Notes (Signed)
  Echocardiogram 2D Echocardiogram has been performed.  [name] 03/25/2012, 5:39 PM

## 2012-03-25 NOTE — Progress Notes (Signed)
Mobilizing Patient examined and I agree with the assessment and plan  Violeta Gelinas, MD, MPH, FACS Pager: [phone]  03/25/2012 6:37 AM

## 2012-03-25 NOTE — Progress Notes (Addendum)
NEURO HOSPITALIST PROGRESS NOTE   SUBJECTIVE:                                                                                                                        Patient is back to her baseline per daughter.  Patient is calm, telling me she is at [facility] hospital, 2014, March 25th.  No complaints.   OBJECTIVE:                                                                                                                           Vital signs in last 24 hours: Temp:  [98.3 F (36.8 C)-100 F (37.8 C)] 98.4 F (36.9 C) (03/25 0736) Pulse Rate:  [67-83] 72 (03/25 0736) Resp:  [23-37] 24 (03/25 0736) BP: (87-139)/(45-67) 118/51 mmHg (03/25 0736) SpO2:  [88 %-100 %] 94 % (03/25 0946) FiO2 (%):  [93 %] 93 % (03/24 1455)  Intake/Output from previous day: 03/24 0701 - 03/25 0700 In: 2057 [I.V.:412; IV Piggyback:150; TPN:1495] Out: 1905 [Urine:1650; Stool:255] Intake/Output this shift: Total I/O In: 95 [I.V.:20; TPN:75] Out: 175 [Urine:175] Nutritional status: Clear Liquid  Past Medical History  Diagnosis Date  . Hypertension   . Goiter     radioactive iodine ablation/notes 01/02/2012  . COPD (chronic obstructive pulmonary disease)   . CHF (congestive heart failure)   . Pneumonia ?01/02/2012; ? 2009  . SOB (shortness of breath)     'all the time" (01/02/2012)  . Hypothyroidism     "I have taken Synthroid before" (01/02/2012)  . Arthritis     "right shoulder" (01/02/2012)  . Diverticulitis      Neurologic Exam:  Mental Status: Alert, oriented, thought content appropriate.  Speech fluent without evidence of aphasia.  Able to follow 3 step commands without difficulty. Cranial Nerves: II:  Visual fields grossly normal, pupils equal, round, reactive to light and accommodation III,IV, VI: ptosis not present, extra-ocular motions intact bilaterally V,VII: smile symmetric, facial light touch sensation normal bilaterally VIII: hearing normal  bilaterally IX,X: gag reflex present XI: bilateral shoulder shrug XII: midline tongue extension Motor: Right : Upper extremity   5/5    Left:     Upper extremity   5/5  Lower extremity   5/5  Lower extremity   5/5 Tone and bulk:normal tone throughout; no atrophy noted Sensory: Pinprick and light touch intact throughout, bilaterally Deep Tendon Reflexes: 2+ and symmetric throughout Plantars: Right: downgoing   Left: downgoing Cerebellar: normal finger-to-nose,  normal heel-to-shin test CV: pulses palpable throughout    Lab Results: Lab Results  Component Value Date/Time   CHOL 114 03/24/2012  6:35 AM   Lipid Panel  Recent Labs  03/24/12 0635  CHOL 114  TRIG 89    Studies/Results: No results found.  MEDICATIONS                                                                                                                        Scheduled: . acetaminophen  500 mg Oral TID  . albuterol  2.5 mg Nebulization Q6H  . antiseptic oral rinse  15 mL Mouth Rinse q12n4p  . chlorhexidine  15 mL Mouth Rinse BID  . enoxaparin (LOVENOX) injection  40 mg Subcutaneous Q24H  . furosemide  40 mg Intravenous Daily  . furosemide  40 mg Intravenous Once  . ipratropium  0.5 mg Nebulization Q6H  . piperacillin-tazobactam (ZOSYN)  IV  3.375 g Intravenous Q8H  . polyethylene glycol  17 g Oral Daily    ASSESSMENT/PLAN:                                                                                                            79 YO female with AMS in setting of infection and lack of sleep.  Today she is alert and oriented showing no signs of confusion or hallucinations. Likely delerium in the setting of infection.  At this time do not see need for MRI as her symptoms have completely cleared.   Recommend:  1)continue good sleep regime 2) continue to treat infection  Neurology will S/O.   Assessment and plan discussed with with attending physician and they are in agreement.    Felicie Morn  PA-C [facility] [phone]  03/25/2012, 11:35 AM   I have seen and evaluated the patient. I have reviewed the above note and made appropriate changes. Delirium, symptoms seem to have resolved with sleep. I would continue to give low dose atypical agents at night if needed to ensure good sleep to avoid recurrance. No need for MRI given improvement. Will sign off at this time.   Ritta Slot, MD [facility] [phone]  If 7pm- 7am, please page neurology on call at [phone].

## 2012-03-25 NOTE — Progress Notes (Signed)
Pt alert,oriented x4, and following commands. No need for safety sitter at this time. Notified MD and sitter DC'ed.  Will continue to monitor.

## 2012-03-25 NOTE — Progress Notes (Signed)
Patient ID: [name]  female  ZOX:[id number]    DOB: 1925-08-03    DOA: 03/07/2012  PCP: Kaleen Mask, MD  Assessment/Plan:  Acute delirium: Unclear etiology, but significantly improved from yesterday. Possibility of residual anesthesia effect, narcotics (not any more), tramadol and flexeril (DC'ed), lack of sleep (per family, she had not slept for several days). No CO2 narcosis, ammonia level was normal. Patient slept well last night possibly cause of improvement of delerium this morning. - Cont scheduled albuterol and Atrovent nebs  - MRI brain pending   Recurrent acute sigmoid diverticulitis with intra-abdominal abscess:  Prolonged post-op ileus. 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  - restarted diet today after swallow eval, on TPN - [facility] closely following   Hypertension: dc'ed lisinopril   History of Chronic diastolic CHF (congestive heart failure): fluid overloaded after starting TPN, had been on IVF BNP 3342 on 3/19 but improved to 821 on 3/24 -  EF 55-60% based on 2D Echo on 02/25/2009, repeat ECHO pending -  cont IV lasix until TNA off, nebs, O2  Right lower lobe pneumonia, likely aspiration, continue IV Zosyn  - cont nectar thick diet as per speech therapy     H/O: hypothyroidism - Stable, last TSH 1/14 normal, the patient is not on any replacement  History of COPD with acute bronchitis - placed on alb and atrovent nebs scheduled  DVT Prophylaxis: lovenox  Code Status:  Disposition:   Subjective: Mental status significantly improved today, alert and oriented today, slept well last night.  Objective: Weight change:   Intake/Output Summary (Last 24 hours) at 03/25/12 1136 Last data filed at 03/25/12 0800  Gross per 24 hour  Intake   2152 ml  Output   1830 ml  Net    322 ml   Blood pressure 118/51, pulse 72, temperature 98.4 F (36.9 C), temperature source Oral, resp. rate 24, height 5\' 1"  (1.549 m), weight 65.726  kg (144 lb 14.4 oz), SpO2 94.00%.  Physical Exam: General: alert and oriented, responding to most questions appropriately CVS: S1-S2 clear Chest: decreased breath sounds at the bases but no wheezing or rales Abdomen:  Dressing intact, colostomy with output Extremities: no c/c/e   Lab Results: Basic Metabolic Panel:  Recent Labs Lab 03/23/12 0505 03/24/12 0635  NA 137 139  K 3.7 3.7  CL 97 100  CO2 31 30  GLUCOSE 135* 143*  BUN 21 24*  CREATININE 0.62 0.65  CALCIUM 9.1 8.8  MG  --  2.2  PHOS  --  3.6   Liver Function Tests:  Recent Labs Lab 03/20/12 0540 03/24/12 0635  AST 16 23  ALT 9 16  ALKPHOS 57 64  BILITOT 0.4 0.6  PROT 5.3* 6.3  ALBUMIN 2.3* 2.7*   CBC:  Recent Labs Lab 03/23/12 0505 03/24/12 0635  WBC 9.7 9.5  NEUTROABS  --  7.3  HGB 11.8* 11.1*  HCT 32.8* 32.9*  MCV 89.4 88.9  PLT 328 300   Cardiac Enzymes: No results found for this basename: CKTOTAL, CKMB, CKMBINDEX, TROPONINI,  in the last 168 hours BNP: No components found with this basename: POCBNP,  CBG:  Recent Labs Lab 03/24/12 1521 03/24/12 1952 03/25/12 0004 03/25/12 0359 03/25/12 0740  GLUCAP 130* 141* 137* 140* 125*     Micro Results: Recent Results (from the past 240 hour(s))  URINE CULTURE     Status: None   Collection Time    03/19/12 10:21 AM  Result Value Range Status   Specimen Description URINE, CLEAN CATCH   Final   Special Requests NONE   Final   Culture  Setup Time 03/19/2012 12:09   Final   Colony Count NO GROWTH   Final   Culture NO GROWTH   Final   Report Status 03/20/2012 FINAL   Final    Studies/Results: Ct Abdomen Pelvis W Contrast  03/07/2012  *RADIOLOGY REPORT*  Clinical Data: Lower abdominal pain, history diverticulitis, CHF, COPD  CT ABDOMEN AND PELVIS WITH CONTRAST  Technique:  Multidetector CT imaging of the abdomen and pelvis was performed following the standard protocol during bolus administration of intravenous contrast. Sagittal and  coronal MPR images reconstructed from axial data set.  Contrast: 80mL OMNIPAQUE IOHEXOL 300 MG/ML  SOLN Dilute oral contrast.  Comparison: 02/07/2012  Findings: Minimal atelectasis posterior right lower lobe base. Improved atelectasis left lower lobe. Mitral annular calcification noted. Bilateral renal cysts. Liver, spleen, pancreas, with kidneys, and adrenal glands otherwise normal appearance.  Stomach incompletely distended, suboptimal assessment wall thickness. Small umbilical hernia containing fat. Diverticulosis of descending and sigmoid colon. Wall thickening identified at mid sigmoid colon with improved pericolic inflammatory changes compatible with acute diverticulitis. Extraluminal gas collections medial to the thickened sigmoid segment again seen compatible with contained perforation within the mesocolon. The presence of minimal high attenuation within this collection raises the possibility that this collection demonstrates continued communication with the adjacent sigmoid colon lumen.  No discrete drainable fluid/abscess collection identified. No free intraperitoneal air. Small bowel loops and remainder colon showed no additional acute changes. Scattered atherosclerotic calcifications. No mass, adenopathy, free to free of fluid, or hernia. Bones diffusely demineralized.  IMPRESSION: Persistent acute diverticulitis changes of the sigmoid colon with wall thickening and further improved pericolic infiltrative changes. Persistent visualization of extraluminal gas collections within the sigmoid mesocolon at site of prior diverticular abscess, containing a small amount of central high attenuation which could represent GI contrast and indicate continued communication of this collection with the sigmoid lumen. Diverticulosis of sigmoid and descending colon.   Original Report Authenticated By: Ulyses Southward, M.D.     Medications: Scheduled Meds: . acetaminophen  500 mg Oral TID  . albuterol  2.5 mg Nebulization  Q6H  . antiseptic oral rinse  15 mL Mouth Rinse q12n4p  . chlorhexidine  15 mL Mouth Rinse BID  . enoxaparin (LOVENOX) injection  40 mg Subcutaneous Q24H  . furosemide  40 mg Intravenous Daily  . furosemide  40 mg Intravenous Once  . ipratropium  0.5 mg Nebulization Q6H  . piperacillin-tazobactam (ZOSYN)  IV  3.375 g Intravenous Q8H  . polyethylene glycol  17 g Oral Daily      LOS: 18 days   Yocelin Vanlue M.D. [facility] 03/25/2012, 11:36 AM Pager: [phone]  If 7PM-7AM, please contact night-coverage [email or web address] Password [email or web address]

## 2012-03-25 NOTE — Progress Notes (Signed)
Clinical Child psychotherapist (CSW) observed that pt transferred to unit 3300. CSW updated pt FL2 and submitted updated clinicals to [facility] for potential placement. CSW to remain following.  Theresia Bough, MSW, Theresia Majors [phone]

## 2012-03-25 NOTE — Clinical Social Work Psych Assess (Signed)
Clinical Social Work Department CLINICAL SOCIAL WORK PSYCHIATRY SERVICE LINE ASSESSMENT 03/25/2012  Patient:  [name]  Account:  [id number]  Admit Date:  03/07/2012  Clinical Social Worker:  [name]  Date/Time:  03/25/2012 01:03 PM Referred by:  RN  Date referred:  03/25/2012 Reason for Referral  Behavioral Health Issues   Presenting Symptoms/Problems (In the person's/family's own words):   Pt presents to [facility] with SOB.  Pt was alert and oriented x4 during assessment.  Pt was answering and responding appropriately.  Pt did not verbalize any concerns at this time.  Psych consult was requested to determine capacity.   Abuse/Neglect/Trauma History (check all that apply)  Denies history   Abuse/Neglect/Trauma Comments:   none reported   Psychiatric History (check all that apply)  Denies history   Psychiatric medications:  haloperidol lactate (HALDOL) injection 1 mg, 1 mg, Intravenous, Q8H PRN, Jacqueline Sea, MD; ziprasidone (GEODON) injection 20 mg, 20 mg, Intramuscular, QHS PRN, Jacqueline K Rai, MD;  zolpidem (AMBIEN) tablet 5 mg, 5 mg, Oral, QHS PRN, Jacqueline Sea, MD   Current Mental Health Hospitalizations/Previous Mental Health History:   pt denies history   Current provider:   none reported   Place and Date:   none reported   Current Medications:   Jacqueline Filler, MD, 15 mL at 03/25/12 0829  docusate sodium (COLACE) capsule 100 mg, 100 mg, Oral, BID PRN, Jacqueline Dort, PA-C;  enoxaparin (LOVENOX) injection 40 mg, 40 mg, Subcutaneous, Q24H, Jacqueline Filler, MD, 40 mg at 03/25/12 0905;  fat emulsion 20 % infusion 250 mL, 250 mL, Intravenous, Continuous TPN, [name], [facility], Last Rate: 10 mL/hr at 03/24/12 1738, 250 mL at 03/24/12 1738  furosemide (LASIX) injection 40 mg, 40 mg, Intravenous, Daily, Jacqueline K Rai, MD, 40 mg at 03/24/12 1157; furosemide (LASIX) injection 40 mg, 40 mg, Intravenous, Once, Jacqueline K Rai, MD;  , 1 mg at 03/24/12 1004;  ipratropium (ATROVENT) nebulizer solution 0.5 mg, 0.5 mg, Nebulization, Q6H, Jacqueline K Rai, MD, 0.5 mg at 03/25/12 0946  naloxone [facility]) injection 0.4 mg, 0.4 mg, Intravenous, PRN, Jacqueline Filler, MD;  ondansetron (ZOFRAN) injection 4 mg, 4 mg, Intravenous, Q6H PRN, Jacqueline Mody, MD, 4 mg at 03/11/12 0751;  piperacillin-tazobactam (ZOSYN) IVPB 3.375 g, 3.375 g, Intravenous, Q8H, [name], RPH, 3.375 g at 03/25/12 0610;  polyethylene glycol (MIRALAX / GLYCOLAX) packet 17 g, 17 g, Oral, Daily, Jacqueline Dort, PA-C, 17 g at 03/22/12 1033  RESOURCE THICKENUP CLEAR, , Oral, PRN, Jacqueline K Rai, MD; sodium chloride 0.9 % injection 10-40 mL, 10-40 mL, Intracatheter, PRN, Jacqueline K Rai, MD, 10 mL at 03/22/12 1706;  sodium chloride 0.9 % injection 9 mL, 9 mL, Intravenous, PRN, Jacqueline Filler, MD;  TPN (CLINIMIX-E) Adult, , Intravenous, Continuous TPN, [name], [facility], Last Rate: 65 mL/hr at 03/24/12 1737   Previous Impatient Admission/Date/Reason:   Admitted 02/07/2012 c/o abdominal pain   Emotional Health / Current Symptoms    Suicide/Self Harm  None reported   Suicide attempt in the past:   none reported   Other harmful behavior:   none reported   Psychotic/Dissociative Symptoms  None reported   Other Psychotic/Dissociative Symptoms:   Chart reviewed and pt admission complaints of delirium. CSW noted no signs of delirium upon assessment.    Attention/Behavioral Symptoms  Within Normal Limits   Other Attention / Behavioral Symptoms:   none reported or noted    Cognitive Impairment  Orientation - Place  Orientation -  Self  Orientation - Situation  Orientation - Time   Other Cognitive Impairment:   none noted    Mood and Adjustment  Mood Congruent    Stress, Anxiety, Trauma, Any Recent Loss/Stressor  None reported   Anxiety (frequency):   none reported or noted   Phobia (specify):   none reported or noted   Compulsive behavior (specify):   none  reported or noted   Obsessive behavior (specify):   none reported or noted   Other:   none reported or noted   Substance Abuse/Use  None   SBIRT completed (please refer for detailed history):  NA  Self-reported substance use:   none reported   Urinary Drug Screen Completed:  N Alcohol level:   n/a    Environmental/Housing/Living Arrangement  Stable housing   Who is in the home:   pt lives alone   Emergency contact:  Son [name] [phone]   Financial  Medicare   Patient's Strengths and Goals (patient's own words):   Pt is able to request and does ask for assistance when needed.  Pt very realistic about the care she will need upon d/c.  Pt very self aware of her limitations.  Pt has 6 children.  5 of which live within 10 miles of her home. The other one lives in [place]. Pt has a large support system.   Clinical Social Worker's Interpretive Summary:   Pt presents to CSW during assessment at baseline prior to admission.  Pt has progressed drastically since the time of [name] (Psychiatry) assessment.   Disposition:  Psych Clinical Social Worker signing off [name], [place] [phone]  Clinical Social Work

## 2012-03-25 NOTE — Progress Notes (Signed)
Patient ID: ANASTACIA REINECKE, female   DOB: 11-17-1925, 79 y.o.   MRN: [id number] 13 Days Post-Op  Subjective: Pt still confused, but not agitated right now.  No other complaints except for back pain due to bed.  Objective: Vital signs in last 24 hours: Temp:  [98.3 F (36.8 C)-100 F (37.8 C)] 98.4 F (36.9 C) (03/25 0736) Pulse Rate:  [67-83] 82 (03/25 0355) Resp:  [23-37] 23 (03/25 0355) BP: (87-139)/(45-67) 107/57 mmHg (03/25 0355) SpO2:  [88 %-100 %] 100 % (03/25 0355) FiO2 (%):  [93 %] 93 % (03/24 1455) Last BM Date: 03/24/12  Intake/Output from previous day: 03/24 0701 - 03/25 0700 In: 1962 [I.V.:392; IV Piggyback:150; TPN:1420] Out: 1905 [Urine:1650; Stool:255] Intake/Output this shift: Total I/O In: -  Out: 175 [Urine:175]  PE: Abd: softer, and less distended today, +BS, ostomy in place with some output.  Incision c/d/i  Lab Results:   Recent Labs  03/23/12 0505 03/24/12 0635  WBC 9.7 9.5  HGB 11.8* 11.1*  HCT 32.8* 32.9*  PLT 328 300   BMET  Recent Labs  03/23/12 0505 03/24/12 0635  NA 137 139  K 3.7 3.7  CL 97 100  CO2 31 30  GLUCOSE 135* 143*  BUN 21 24*  CREATININE 0.62 0.65  CALCIUM 9.1 8.8   PT/INR No results found for this basename: LABPROT, INR,  in the last 72 hours CMP     Component Value Date/Time   NA 139 03/24/2012 0635   K 3.7 03/24/2012 0635   CL 100 03/24/2012 0635   CO2 30 03/24/2012 0635   GLUCOSE 143* 03/24/2012 0635   BUN 24* 03/24/2012 0635   CREATININE 0.65 03/24/2012 0635   CREATININE 0.70 01/14/2012 1330   CALCIUM 8.8 03/24/2012 0635   PROT 6.3 03/24/2012 0635   ALBUMIN 2.7* 03/24/2012 0635   AST 23 03/24/2012 0635   ALT 16 03/24/2012 0635   ALKPHOS 64 03/24/2012 0635   BILITOT 0.6 03/24/2012 0635   GFRNONAA 79* 03/24/2012 0635   GFRAA >90 03/24/2012 0635   Lipase  No results found for this basename: lipase       Studies/Results: Dg Chest Port 1 View  03/23/2012  *RADIOLOGY REPORT*  Clinical Data: Shortness of breath.   PORTABLE CHEST - 1 VIEW  Comparison:   the previous day's study  Findings: Right arm PICC stable.  Coarse bilateral calcifications, presumably thyroid, at the thoracic inlet with possible tracheal narrowing. Heart size upper limits normal.  Lungs are clear.  No effusion.  IMPRESSION:  1.  Stable mild cardiomegaly. 2.  Suspect goiter. In the absence of any prior studies, consider elective outpatient thyroid ultrasound for further characterization.   Original Report Authenticated By: D. Andria Rhein, MD     Anti-infectives: Anti-infectives   Start     Dose/Rate Route Frequency Ordered Stop   03/20/12 0900  piperacillin-tazobactam (ZOSYN) IVPB 3.375 g     3.375 g 12.5 mL/hr over 240 Minutes Intravenous 3 times per day 03/20/12 0839     03/08/12 1900  ertapenem (INVANZ) 1 g in sodium chloride 0.9 % 50 mL IVPB  Status:  Discontinued     1 g 100 mL/hr over 30 Minutes Intravenous Every 24 hours 03/08/12 1408 03/19/12 1106   03/07/12 1800  ertapenem (INVANZ) 1 g in sodium chloride 0.9 % 50 mL IVPB     1 g 100 mL/hr over 30 Minutes Intravenous To Emergency Dept 03/07/12 1721 03/07/12 1955   03/07/12 1730  ertapenem (INVANZ)  1 g in sodium chloride 0.9 % 50 mL IVPB  Status:  Discontinued     1 g 100 mL/hr over 30 Minutes Intravenous  Once 03/07/12 1716 03/07/12 1717       Assessment/Plan  1. S/p Hartman's for diverticulitis 2. Delirium 3. Post op ileus, resolving 4. Dysphagia 5. PCM/TNA  Plan: 1. Swallow study reveals dysphagia, SLP to see patient again today.  Follow their recommendations.  If passes today, may have clear liquids from surgical standpoint 2. Dc staples, wound appears well healed 3. Delirium, etc per medicine 4. Cont to mobilize patient  5. Cont TNA for nutritional support while NPO  LOS: 18 days    Aza Dantes E 03/25/2012, 8:21 AM Pager: [phone]

## 2012-03-25 NOTE — Progress Notes (Signed)
Physical Therapy Treatment Patient Details Name: [name] MRN: [id number] DOB: [birth year] Today's Date: 03/25/2012 Time: 1443-1500 PT Time Calculation (min): 17 min  PT Assessment / Plan / Recommendation Comments on Treatment Session  79 y.o. female admitted to [facility] with abdominal pain found to have an abcess now s/p end colostomy.  She presents today much weaker than her last session with PT only able to walk 12' with increased DOE and leg weakness limiting her ability to go further.  She continues to be appropriate for SNF level therapy at discharge.      Follow Up Recommendations  SNF     Does the patient have the potential to tolerate intense rehabilitation    NA  Barriers to Discharge   none      Equipment Recommendations  None recommended by PT    Recommendations for Other Services   none  Frequency Min 3X/week   Plan Discharge plan remains appropriate;Frequency remains appropriate    Precautions / Restrictions Precautions Precautions: Fall   Pertinent Vitals/Pain O2 sats low 90s with gait on RA, DOE 3/4 with gait.  Pain 7/10 incisional pain, pt repositioned in bed after treatment.     Mobility  Bed Mobility Sit to Supine: 3: Mod assist;HOB flat;With rail Details for Bed Mobility Assistance: mod assist to help get bil legs into bed.   Transfers Sit to Stand: 4: Min assist Stand to Sit: 4: Min assist Details for Transfer Assistance: min assist using momentum to get out of low recliner chair.  Ambulation/Gait Ambulation/Gait Assistance: 4: Min assist Ambulation Distance (Feet): 50 Feet Assistive device: Rolling walker Ambulation/Gait Assistance Details: verbal cues to stay inside of RW and for upright posture.  Pt self correcting posture by end of walk.   Gait Pattern: Step-through pattern;Shuffle;Trunk flexed Gait velocity: less than 1.8 ft/sec putting her at risk for recurrent falls.  General Gait Details: pt needed 3 standing rest breaks due to 3/4 DOE with gait.   O2 sats hovering in the low 90s on RA.        PT Goals Acute Rehab PT Goals PT Goal: Sit to Supine/Side - Progress: Progressing toward goal PT Goal: Sit to Stand - Progress: Progressing toward goal PT Goal: Ambulate - Progress: Progressing toward goal  Visit Information  Last PT Received On: 03/25/12 Assistance Needed: +1    Subjective Data  Subjective: Pt reports she is weaker than last time she walked in the hallway.     Cognition  Cognition Overall Cognitive Status: Appears within functional limits for tasks assessed/performed Arousal/Alertness: Awake/alert Orientation Level: Oriented X4 / Intact Behavior During Session: WFL for tasks performed       End of Session PT - End of Session Activity Tolerance: Patient limited by fatigue;Patient limited by pain Patient left: in bed;with call bell/phone within reach    [name], PT, DPT [phone]   03/25/2012, 3:09 PM

## 2012-03-25 NOTE — Progress Notes (Signed)
Improving and passed swallow Ostomy output good Patient examined and I agree with the assessment and plan  Violeta Gelinas, MD, MPH, FACS Pager: [phone]  03/25/2012 11:01 AM

## 2012-03-26 ENCOUNTER — Inpatient Hospital Stay (HOSPITAL_COMMUNITY): Payer: Medicare Other

## 2012-03-26 LAB — GLUCOSE, CAPILLARY
Glucose-Capillary: 141 mg/dL — ABNORMAL HIGH (ref 70–99)
Glucose-Capillary: 142 mg/dL — ABNORMAL HIGH (ref 70–99)
Glucose-Capillary: 133 mg/dL — ABNORMAL HIGH (ref 70–99)

## 2012-03-26 MED ORDER — QUETIAPINE FUMARATE ER 50 MG PO TB24
50.0000 mg | ORAL_TABLET | Freq: Every day | ORAL | Status: DC
  Administered 2012-03-26 – 2012-03-27 (×2): 50 mg via ORAL
  Filled 2012-03-26 (×4): qty 1

## 2012-03-26 MED ORDER — ZIPRASIDONE MESYLATE 20 MG IM SOLR
10.0000 mg | Freq: Every evening | INTRAMUSCULAR | Status: DC | PRN
  Filled 2012-03-26: qty 20

## 2012-03-26 MED ORDER — HALOPERIDOL LACTATE 5 MG/ML IJ SOLN
0.5000 mg | Freq: Two times a day (BID) | INTRAMUSCULAR | Status: DC | PRN
  Filled 2012-03-26: qty 0.1

## 2012-03-26 MED ORDER — FAT EMULSION 20 % IV EMUL
250.0000 mL | INTRAVENOUS | Status: AC
  Administered 2012-03-26: 250 mL via INTRAVENOUS
  Filled 2012-03-26: qty 250

## 2012-03-26 MED ORDER — TRACE MINERALS CR-CU-F-FE-I-MN-MO-SE-ZN IV SOLN
INTRAVENOUS | Status: AC
  Administered 2012-03-26: 17:00:00 via INTRAVENOUS
  Filled 2012-03-26: qty 2000

## 2012-03-26 MED ORDER — ALBUTEROL SULFATE (5 MG/ML) 0.5% IN NEBU
2.5000 mg | INHALATION_SOLUTION | Freq: Four times a day (QID) | RESPIRATORY_TRACT | Status: DC | PRN
  Administered 2012-03-26: 2.5 mg via RESPIRATORY_TRACT
  Filled 2012-03-26: qty 0.5

## 2012-03-26 MED ORDER — BUDESONIDE 0.25 MG/2ML IN SUSP
0.2500 mg | Freq: Two times a day (BID) | RESPIRATORY_TRACT | Status: DC
  Administered 2012-03-27 – 2012-03-31 (×8): 0.25 mg via RESPIRATORY_TRACT
  Filled 2012-03-26 (×14): qty 2

## 2012-03-26 MED ORDER — LISINOPRIL 10 MG PO TABS
10.0000 mg | ORAL_TABLET | Freq: Every day | ORAL | Status: DC
  Administered 2012-03-26 – 2012-03-31 (×6): 10 mg via ORAL
  Filled 2012-03-26 (×6): qty 1

## 2012-03-26 MED ORDER — TEMAZEPAM 7.5 MG PO CAPS
7.5000 mg | ORAL_CAPSULE | Freq: Every evening | ORAL | Status: DC | PRN
  Administered 2012-03-29 – 2012-03-30 (×2): 7.5 mg via ORAL
  Filled 2012-03-26 (×3): qty 1

## 2012-03-26 MED ORDER — MAGNESIUM SULFATE 40 MG/ML IJ SOLN
2.0000 g | Freq: Once | INTRAMUSCULAR | Status: AC
  Administered 2012-03-26: 2 g via INTRAVENOUS
  Filled 2012-03-26: qty 50

## 2012-03-26 MED ORDER — IPRATROPIUM BROMIDE 0.02 % IN SOLN: 0.5000 mg | Freq: Four times a day (QID) | RESPIRATORY_TRACT | Status: DC | PRN

## 2012-03-26 NOTE — Progress Notes (Signed)
Patient had a 9 beat run of V-Tach and having frequent PVCs and Bigeminy. Patient has been asymptomatic with all of these occurences. BP is 137/63 and HR is now 76. MD has been made aware. New orders has been given. Will continue to monitor patient for further changes in condition.

## 2012-03-26 NOTE — Progress Notes (Signed)
Patient ID: [name], female   DOB: [birth year], 79 y.o.   MRN: [id number] 14 Days Post-Op  Subjective: Pt feels ok today, except for some abdominal discomfort.  Tolerated clears with no nausea  Objective: Vital signs in last 24 hours: Temp:  [98.3 F (36.8 C)-99 F (37.2 C)] 98.8 F (37.1 C) (03/26 0756) Pulse Rate:  [78-87] 80 (03/26 0756) Resp:  [26-35] 34 (03/26 0756) BP: (92-152)/(48-68) 145/68 mmHg (03/26 0756) SpO2:  [93 %-96 %] 93 % (03/26 0756) Last BM Date: 03/25/12  Intake/Output from previous day: 03/25 0701 - 03/26 0700 In: 2990 [P.O.:720; I.V.:460; IV Piggyback:150; TPN:1660] Out: 2275 [Urine:1875; Stool:400] Intake/Output this shift: Total I/O In: 105 [I.V.:40; TPN:65] Out: 125 [Stool:125]  PE: Abd: soft, +BS, obese, ostomy intact with liquid output.  Stoma pink and viable, wound healing well with steristrips  Lab Results:   Recent Labs  03/24/12 0635  WBC 9.5  HGB 11.1*  HCT 32.9*  PLT 300   BMET  Recent Labs  03/24/12 0635 03/26/12 0511  NA 139  --   K 3.7  --   CL 100  --   CO2 30  --   GLUCOSE 143*  --   BUN 24*  --   CREATININE 0.65 0.67  CALCIUM 8.8  --    PT/INR No results found for this basename: LABPROT, INR,  in the last 72 hours CMP     Component Value Date/Time   NA 139 03/24/2012 0635   K 3.7 03/24/2012 0635   CL 100 03/24/2012 0635   CO2 30 03/24/2012 0635   GLUCOSE 143* 03/24/2012 0635   BUN 24* 03/24/2012 0635   CREATININE 0.67 03/26/2012 0511   CREATININE 0.70 01/14/2012 1330   CALCIUM 8.8 03/24/2012 0635   PROT 6.3 03/24/2012 0635   ALBUMIN 2.7* 03/24/2012 0635   AST 23 03/24/2012 0635   ALT 16 03/24/2012 0635   ALKPHOS 64 03/24/2012 0635   BILITOT 0.6 03/24/2012 0635   GFRNONAA 79* 03/26/2012 0511   GFRAA >90 03/26/2012 0511   Lipase  No results found for this basename: lipase       Studies/Results: No results found.  Anti-infectives: Anti-infectives   Start     Dose/Rate Route Frequency Ordered Stop   03/20/12 0900  piperacillin-tazobactam (ZOSYN) IVPB 3.375 g     3.375 g 12.5 mL/hr over 240 Minutes Intravenous 3 times per day 03/20/12 0839     03/08/12 1900  ertapenem (INVANZ) 1 g in sodium chloride 0.9 % 50 mL IVPB  Status:  Discontinued     1 g 100 mL/hr over 30 Minutes Intravenous Every 24 hours 03/08/12 1408 03/19/12 1106   03/07/12 1800  ertapenem (INVANZ) 1 g in sodium chloride 0.9 % 50 mL IVPB     1 g 100 mL/hr over 30 Minutes Intravenous To Emergency Dept 03/07/12 1721 03/07/12 1955   03/07/12 1730  ertapenem (INVANZ) 1 g in sodium chloride 0.9 % 50 mL IVPB  Status:  Discontinued     1 g 100 mL/hr over 30 Minutes Intravenous  Once 03/07/12 1716 03/07/12 1717       Assessment/Plan  1. S/p Hartman's for tics 2. Delirium 3. Deconditioning 4. Dysphagia, improved 5. PCM/TNA  Plan: 1. Will advance to full liquids today.  SLP still working with patient on swallow function 2. Other medical problems per medicine 3. Continue mobilization 4. Cont TNA until begins to take more po   LOS: 19 days    Delroy Ordway E  03/26/2012, 9:24 AM Pager: [phone]

## 2012-03-26 NOTE — Progress Notes (Signed)
PT Cancellation Note  Patient Details Name: [name] MRN: [id number] DOB: [birth year]   Cancelled Treatment:    Reason Eval/Treat Not Completed: Fatigue/lethargy limiting ability to participate; pt just back to bed and trying to get some rest.  Daughter reports walked to bathroom recently and prior to transfer to floor walked to nurses desk in step down.  Will see tomorrow.   WYNN,CYNDI 03/26/2012, 4:30 PM

## 2012-03-26 NOTE — Progress Notes (Signed)
Speech Language Pathology Dysphagia Treatment Patient Details Name: [name] MRN: [id number] DOB: 26-Sep-1925 Today's Date: 03/26/2012 Time: 4098-1191 SLP Time Calculation (min): 22 min  Assessment / Plan / Recommendation Clinical Impression  Mentation still very good today, but pt continues to demonstrate rapid respiratory rate with consumption of liquids and occasional soft throat clear concerning for risk of aspiration.  Pt did demonstrate ability to masticate regular solids with dentures in place. Pt would be physically able to consume a regular texture diet when ready per surgery.  Given there there is clinical evidence that pt may have had some aspiration prior to transfer to step down, feel objective test is warranted before upgrading diet to thin liquids. Pt does not like thickener and is eager to consume thin, but is willing to evaluate first. SLP will schedule test for today at 1300 and upgrade diet to full, thin liquids per Barnetta Chapel, PA if pt does well.     Diet Recommendation  Continue with Current Diet: Nectar-thick liquid    SLP Plan MBS   Pertinent Vitals/Pain NA   Swallowing Goals  SLP Swallowing Goals Patient will consume recommended diet without observed clinical signs of aspiration with: Minimal assistance Swallow Study Goal #1 - Progress: Progressing toward goal Patient will utilize recommended strategies during swallow to increase swallowing safety with: Minimal cueing Swallow Study Goal #2 - Progress: Progressing toward goal  General Temperature Spikes Noted: No Respiratory Status: Supplemental O2 delivered via (comment) Behavior/Cognition: Alert;Cooperative;Pleasant mood Oral Cavity - Dentition: Dentures, top;Dentures, bottom Patient Positioning: Upright in bed  Oral Cavity - Oral Hygiene Does patient have any of the following "at risk" factors?: Oxygen therapy - cannula, mask, simple oxygen devices;Diet - patient on thickened liquids Brush patient's  teeth BID with toothbrush (using toothpaste with fluoride): Yes Patient is AT RISK - Oral Care Protocol followed (see row info): Yes   Dysphagia Treatment Treatment focused on: Upgraded PO texture trials;Patient/family/caregiver education Treatment Methods/Modalities: Skilled observation Patient observed directly with PO's: Yes Type of PO's observed: Thin liquids;Dysphagia 3 (soft) Feeding: Able to feed self Liquids provided via: Cup;Straw Oral Phase Signs & Symptoms: Prolonged oral phase;Prolonged bolus formation;Prolonged mastication Pharyngeal Phase Signs & Symptoms: Suspected delayed swallow initiation;Immediate throat clear Type of cueing: Verbal Amount of cueing: Minimal   GO    Harlon Ditty, MA CCC-SLP [phone]  [name] 03/26/2012, 9:44 AM

## 2012-03-26 NOTE — Procedures (Signed)
Objective Swallowing Evaluation: Modified Barium Swallowing Study  Patient Details  Name: [name] MRN: [id number] Date of Birth: 1925/02/18  Today's Date: 03/26/2012 Time: 1310-1340 SLP Time Calculation (min): 30 min  Past Medical History:  Past Medical History  Diagnosis Date  . Hypertension   . Goiter     radioactive iodine ablation/notes 01/02/2012  . COPD (chronic obstructive pulmonary disease)   . CHF (congestive heart failure)   . Pneumonia ?01/02/2012; ? 2009  . SOB (shortness of breath)     'all the time" (01/02/2012)  . Hypothyroidism     "I have taken Synthroid before" (01/02/2012)  . Arthritis     "right shoulder" (01/02/2012)  . Diverticulitis    Past Surgical History:  Past Surgical History  Procedure Laterality Date  . Cataract extraction w/ intraocular lens  implant, bilateral  ?1990's  . Appendectomy  1980's    "when they did hysterectomy" (01/02/2012)  . Abdominal hysterectomy  1980's  . Colostomy revision N/A 03/12/2012    Procedure: COLON RESECTION SIGMOID ;  Surgeon: Axel Filler, MD;  Location: [facility] OR;  Service: General;  Laterality: N/A;  . Colostomy N/A 03/12/2012    Procedure: COLOSTOMY;  Surgeon: Axel Filler, MD;  Location: [facility] OR;  Service: General;  Laterality: N/A;   HPI:  Pt currently tolerating nectar thick liquids. Objective study recommended to determine appropriateness for advancing diet to thin.     Assessment / Plan / Recommendation Clinical Impression  Dysphagia Diagnosis: Mild pharyngeal phase dysphagia Clinical impression: Mild pharyngeal dysphagia characterized primarily by delayed swallow reflex, which resulted in vallecular pooling of puree and pyriform pooling of thin liquid.  Flash penetration was noted intermittently on thin liquids, but penetrate was cleared spontaneously and was not aspirated.  Pt tolerated solid consistency and barium tablet without difficulty. Recommend advancing diet to dys 3/thin liquids when ok per surgery.  In  the interim, will advance liquids to thin and follow for diet tolerance.    Treatment Recommendation  Therapy as outlined in treatment plan below    Diet Recommendation Thin liquid (Full liquid until surgery ok's dys 3 solids)   Liquid Administration via: Cup;Straw Medication Administration: Whole meds with liquid Supervision: Intermittent supervision to cue for compensatory strategies Compensations: Slow rate;Small sips/bites Postural Changes and/or Swallow Maneuvers: Seated upright 90 degrees    Other  Recommendations Oral Care Recommendations: Oral care BID   Follow Up Recommendations  None    Frequency and Duration min 1 x/week  1 week   Pertinent Vitals/Pain Pt reports discomfort sitting upright    SLP Swallow Goals Patient will consume recommended diet without observed clinical signs of aspiration with: Set-up Swallow Study Goal #1 - Progress: Progressing toward goal Patient will utilize recommended strategies during swallow to increase swallowing safety with: Set-up Swallow Study Goal #2 - Progress: Progressing toward goal   General Date of Onset: 03/07/12 HPI: Pt currently tolerating nectar thick liquids. Objective study recommended to determine appropriateness for advancing diet to thin. Type of Study: Modified Barium Swallowing Study Reason for Referral: Objectively evaluate swallowing function Diet Prior to this Study: Nectar-thick liquids Temperature Spikes Noted: No Respiratory Status: Room air Behavior/Cognition: Alert;Cooperative;Pleasant mood Oral Cavity - Dentition: Dentures, bottom;Dentures, top Oral Motor / Sensory Function: Within functional limits Self-Feeding Abilities: Able to feed self Patient Positioning: Upright in bed Baseline Vocal Quality: Clear Volitional Cough: Weak Volitional Swallow: Able to elicit Anatomy: Within functional limits Pharyngeal Secretions: Not observed secondary MBS    Reason for Referral Objectively evaluate  swallowing  function   Oral Phase Oral Preparation/Oral Phase Oral Phase: WFL   Pharyngeal Phase Pharyngeal Phase Pharyngeal Phase: Impaired Pharyngeal - Thin Pharyngeal - Thin Straw: Delayed swallow initiation;Premature spillage to pyriform sinuses;Premature spillage to valleculae;Reduced airway/laryngeal closure;Penetration/Aspiration during swallow Penetration/Aspiration details (thin straw): Material enters airway, remains ABOVE vocal cords then ejected out;Material does not enter airway Pharyngeal - Solids Pharyngeal - Puree: Premature spillage to valleculae;Delayed swallow initiation  Cervical Esophageal Phase    [name] [facility], CCC-SLP [phone]  Cervical Esophageal Phase Cervical Esophageal Phase: [facility]         Leigh Aurora 03/26/2012, 2:27 PM

## 2012-03-26 NOTE — Progress Notes (Signed)
Patient ID: [name]  female  ZOX:[id number]    DOB: Jun 06, 1925    DOA: 03/07/2012  PCP: Kaleen Mask, MD  Assessment/Plan:  Acute delirium: Unclear etiology, but continue improving. Possibility of toxic metabolic encephalopathy due to bronchitis and presumed UTI; also due to residual anesthesia effect, Meds (narcotics, tramadol and flexeril, ambien, lack of sleep (per family, she had not slept for several days). No CO2 narcosis, ammonia level was normal.  - Cont zosyn -start restoril -minimize pain meds -start seroquel -d/c foley -awake during daytime and sleeping at night time -per psych rec's will use PRN geodon for agitation.  Recurrent acute sigmoid diverticulitis with intra-abdominal abscess:   -Prolonged post-op ileus. POD#14 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  - restarted diet on 3/25 after swallow eval, on TPN until nutrition/intake improved and tolerated - CCS closely following  Hypertension: resume lisinopril  History of Chronic diastolic CHF (congestive heart failure): fluid overloaded after starting TPN,  -had been on IVF BNP 3342 on 3/19 but improved to 821 on 3/24) - EF 55-60% based on 2D Echo on 02/25/2009, repeat ECHO demonstrated EF down to 40-45%, no wall motion abnormalities - cont IV lasix until TNA off -Resume lisinopril  Right lower lobe pneumonia, likely aspiration, continue IV Zosyn  - cont nectar thick diet as per speech therapy  -breathing improving; WBC's WNL  H/O: hypothyroidism - Stable, last TSH 1/14 normal, the patient is not on any replacement -will monitor as an outpatient  History of COPD with acute bronchitis - placed on pulmicort -change alb and atrovent nebs PRN -continue abx's  DVT Prophylaxis: lovenox  Code Status:Full  Disposition: move to telemetry bed; follow improvement/stability prior to discharge; needs SNF  Subjective: Mental status continue improving; no nausea, no vomiting;  tolerating CLD. Afebrile; patient with mild to moderate discomfort on her abdomen.  Objective: Weight change:   Intake/Output Summary (Last 24 hours) at 03/26/12 0856 Last data filed at 03/26/12 0800  Gross per 24 hour  Intake   3000 ml  Output   2225 ml  Net    775 ml   Blood pressure 145/68, pulse 80, temperature 98.8 F (37.1 C), temperature source Oral, resp. rate 34, height 5\' 1"  (1.549 m), weight 65.726 kg (144 lb 14.4 oz), SpO2 93.00%.  Physical Exam: General: alert, awake and oriented X2, responding appropriately to all questions and at this point with good insight of her situation. Afebrile. CVS: S1-S2, regular rate; no rubs or gallops Chest: positive wheezing, no crackles and scattered rhonchi Abdomen:  Dressing intact, colostomy with positive output, mild distension , positive BS Extremities: no c/c/e   Lab Results: Basic Metabolic Panel:  Recent Labs Lab 03/23/12 0505 03/24/12 0635 03/26/12 0511  NA 137 139  --   K 3.7 3.7  --   CL 97 100  --   CO2 31 30  --   GLUCOSE 135* 143*  --   BUN 21 24*  --   CREATININE 0.62 0.65 0.67  CALCIUM 9.1 8.8  --   MG  --  2.2  --   PHOS  --  3.6  --    Liver Function Tests:  Recent Labs Lab 03/20/12 0540 03/24/12 0635  AST 16 23  ALT 9 16  ALKPHOS 57 64  BILITOT 0.4 0.6  PROT 5.3* 6.3  ALBUMIN 2.3* 2.7*   CBC:  Recent Labs Lab 03/23/12 0505 03/24/12 0635  WBC 9.7 9.5  NEUTROABS  --  7.3  HGB 11.8* 11.1*  HCT 32.8* 32.9*  MCV 89.4 88.9  PLT 328 300   CBG:  Recent Labs Lab 03/25/12 1553 03/25/12 1926 03/25/12 2320 03/26/12 0245 03/26/12 0814  GLUCAP 130* 133* 141* 101* 146*     Micro Results: Recent Results (from the past 240 hour(s))  URINE CULTURE     Status: None   Collection Time    03/19/12 10:21 AM      Result Value Range Status   Specimen Description URINE, CLEAN CATCH   Final   Special Requests NONE   Final   Culture  Setup Time 03/19/2012 12:09   Final   Colony Count NO  GROWTH   Final   Culture NO GROWTH   Final   Report Status 03/20/2012 FINAL   Final    Studies/Results: Ct Abdomen Pelvis W Contrast  03/07/2012  *RADIOLOGY REPORT*  Clinical Data: Lower abdominal pain, history diverticulitis, CHF, COPD  CT ABDOMEN AND PELVIS WITH CONTRAST  Technique:  Multidetector CT imaging of the abdomen and pelvis was performed following the standard protocol during bolus administration of intravenous contrast. Sagittal and coronal MPR images reconstructed from axial data set.  Contrast: 80mL OMNIPAQUE IOHEXOL 300 MG/ML  SOLN Dilute oral contrast.  Comparison: 02/07/2012  Findings: Minimal atelectasis posterior right lower lobe base. Improved atelectasis left lower lobe. Mitral annular calcification noted. Bilateral renal cysts. Liver, spleen, pancreas, with kidneys, and adrenal glands otherwise normal appearance.  Stomach incompletely distended, suboptimal assessment wall thickness. Small umbilical hernia containing fat. Diverticulosis of descending and sigmoid colon. Wall thickening identified at mid sigmoid colon with improved pericolic inflammatory changes compatible with acute diverticulitis. Extraluminal gas collections medial to the thickened sigmoid segment again seen compatible with contained perforation within the mesocolon. The presence of minimal high attenuation within this collection raises the possibility that this collection demonstrates continued communication with the adjacent sigmoid colon lumen.  No discrete drainable fluid/abscess collection identified. No free intraperitoneal air. Small bowel loops and remainder colon showed no additional acute changes. Scattered atherosclerotic calcifications. No mass, adenopathy, free to free of fluid, or hernia. Bones diffusely demineralized.  IMPRESSION: Persistent acute diverticulitis changes of the sigmoid colon with wall thickening and further improved pericolic infiltrative changes. Persistent visualization of extraluminal  gas collections within the sigmoid mesocolon at site of prior diverticular abscess, containing a small amount of central high attenuation which could represent GI contrast and indicate continued communication of this collection with the sigmoid lumen. Diverticulosis of sigmoid and descending colon.   Original Report Authenticated By: Ulyses Southward, M.D.     Medications: Scheduled Meds: . acetaminophen  500 mg Oral TID  . antiseptic oral rinse  15 mL Mouth Rinse q12n4p  . budesonide (PULMICORT) nebulizer solution  0.25 mg Nebulization BID  . chlorhexidine  15 mL Mouth Rinse BID  . enoxaparin (LOVENOX) injection  40 mg Subcutaneous Q24H  . furosemide  40 mg Intravenous Daily  . lisinopril  10 mg Oral Daily  . piperacillin-tazobactam (ZOSYN)  IV  3.375 g Intravenous Q8H  . polyethylene glycol  17 g Oral Daily  . QUEtiapine  50 mg Oral QHS      LOS: 19 days   Jackqulyn Mendel M.D. [facility] 03/26/2012, 8:56 AM Pager: [phone]  If 7PM-7AM, please contact night-coverage [email or web address] Password [email or web address]

## 2012-03-26 NOTE — Progress Notes (Signed)
Fulls and speech therapy Patient examined and I agree with the assessment and plan  Violeta Gelinas, MD, MPH, FACS Pager: [phone]  03/26/2012 3:00 PM

## 2012-03-26 NOTE — Progress Notes (Signed)
Clinical Child psychotherapist (CSW) met with pt daughter and confirmed that the plan remains for SNF placement at [facility]. CSW answered daughters questions regarding insurance costs and transportation. CSW also provided [facility] with information regarding pt colostomy type/size. CSW to remain following and assist with dc when pt is medically ready.  Theresia Bough, MSW, Theresia Majors [phone]

## 2012-03-26 NOTE — Progress Notes (Signed)
PARENTERAL NUTRITION CONSULT NOTE  Pharmacy Consult for TPN (Day # 10) Indication: Prolonged ileus   Allergies  Allergen Reactions  . Indomethacin Anaphylaxis and Other (See Comments)    "took it fine for awhile; one day I stopped breathing and I ended up in the hospital" (01/02/2012)    Patient Measurements: Height: 5\' 1"  (154.9 cm) Weight: 144 lb 14.4 oz (65.726 kg) IBW/kg (Calculated) : 47.8 Adjusted Body Weight: 53.2 Usual Weight: 65.7  Vital Signs: Temp: 98.8 F (37.1 C) (03/26 0756) Temp src: Oral (03/26 0756) BP: 145/68 mmHg (03/26 0756) Pulse Rate: 80 (03/26 0756) Intake/Output from previous day: 03/25 0701 - 03/26 0700 In: 2990 [P.O.:720; I.V.:460; IV Piggyback:150; TPN:1660] Out: 2275 [Urine:1875; Stool:400] Intake/Output from this shift: Total I/O In: 105 [I.V.:40; TPN:65] Out: 125 [Stool:125]  Labs:  Recent Labs  03/24/12 0635  WBC 9.5  HGB 11.1*  HCT 32.9*  PLT 300     Recent Labs  03/24/12 0635 03/26/12 0511  NA 139  --   K 3.7  --   CL 100  --   CO2 30  --   GLUCOSE 143*  --   BUN 24*  --   CREATININE 0.65 0.67  CALCIUM 8.8  --   MG 2.2  --   PHOS 3.6  --   PROT 6.3  --   ALBUMIN 2.7*  --   AST 23  --   ALT 16  --   ALKPHOS 64  --   BILITOT 0.6  --   PREALBUMIN 16.4*  --   TRIG 89  --   CHOL 114  --    Estimated Creatinine Clearance: 45.5 ml/min (by C-G formula based on Cr of 0.67).    Recent Labs  03/25/12 2320 03/26/12 0245 03/26/12 0814  GLUCAP 141* 101* 146*    Medical History: Past Medical History  Diagnosis Date  . Hypertension   . Goiter     radioactive iodine ablation/notes 01/02/2012  . COPD (chronic obstructive pulmonary disease)   . CHF (congestive heart failure)   . Pneumonia ?01/02/2012; ? 2009  . SOB (shortness of breath)     'all the time" (01/02/2012)  . Hypothyroidism     "I have taken Synthroid before" (01/02/2012)  . Arthritis     "right shoulder" (01/02/2012)  . Diverticulitis     Medications:   Scheduled:  . acetaminophen  500 mg Oral TID  . antiseptic oral rinse  15 mL Mouth Rinse q12n4p  . budesonide (PULMICORT) nebulizer solution  0.25 mg Nebulization BID  . chlorhexidine  15 mL Mouth Rinse BID  . enoxaparin (LOVENOX) injection  40 mg Subcutaneous Q24H  . furosemide  40 mg Intravenous Daily  . lisinopril  10 mg Oral Daily  . piperacillin-tazobactam (ZOSYN)  IV  3.375 g Intravenous Q8H  . polyethylene glycol  17 g Oral Daily  . QUEtiapine  50 mg Oral QHS  . [DISCONTINUED] albuterol  2.5 mg Nebulization Q6H  . [DISCONTINUED] furosemide  40 mg Intravenous Once  . [DISCONTINUED] ipratropium  0.5 mg Nebulization Q6H    Insulin Requirements in the past 24 hours: SSI discontinued and CBG's have remained within range.  Current Nutrition:  She tolerated clear liquids and is advancing to full liquids today.   Clinimix E5/20 at 65 ml/hr (goal, provides 78g protein per day and an average of ~1580 kcal/day).  Her prealbumin is trending up 11.7 >> 16.4 and is just below desired goal.  Nutritional Goals:  1640-1820 kCal, 70-80  grams of protein per day  Assessment: Admitted 3/7 with abdominal pain x 1 week.  She has lost 30lb in last 15 months due to abdominal pain.  Now s/p sigmoid resection, Hartman's pouch, & end colostomy for smoldering diverticulitis; prolonged ileus.  She continues to have active delirium which is preventing advancement of diet.  IV Pump:  Verified rate on IV pump, running correctly without noted complications.  GI:  POD#14, prolonged ileus - now resolving per MD note.  Now passing stool into her ostomy.  She has had difficulties with PO intake through her course - poor appetite, possible aspiration, delirium - and this appears to be improving.  Per CCS, will continue TPN until her PO intake improves.  Endo:  No hx DM.  CBGs have been well-controlled and < 150, her SSI coverage was discontinued on 3/20.  Hx goiter, radioiodine tx , on no thyroid replacement  pta.    Lytes:  No new labs today but electrolytes were ok 3/24. IVF: NS at [facility].  Renal:  Cr stable at 0.7, continues to have stable UOP on IV Lasix for diuresis.  Pulm:  Hx COPD, wheezing - now back on Atrovent/Albuterol/Budesonide nebs.   Cards:  Hx HTN, CHF.  VSS.  Lisinopril, Lasix 40mg  IV qday for volume overload.   Hepatobil:   LFTs wnl on 3/20.  Cholesterol and triglycerides are fine.  Neuro: Acute delirium - have now added Seroquel QHS and Geodon prn.  Vicodin for pain control.  QTc this AM 0.4.  ID:   Aspiration PNA - Day #7 Zosyn.  AFeb, WBC 9.5; s/p Invanz 3/7>3/18 for Diverticulitis & abscess.  Best Practices:  Lovenox VTE px TPN Access:  PICC for TNA TPN day#: 10  Plan:  Continue Clinimix E 5/20 at 65 ml/hr. Provide lipids, MVI, and trace elements on Mon-Wed-Fri only due to national backorder. Continue NS at [facility] TPN labs in AM Follow-up tolerance and advancement of diet for potential discontinuation of TPN.  If full liquid diet is tolerated will consider decreasing TPN rate to stimulate appetite.  Estella Husk, Pharm.D., BCPS Clinical Pharmacist Phone: [phone] or [phone] Pager: [phone] 03/26/2012, 9:35 AM

## 2012-03-26 NOTE — Progress Notes (Signed)
Report called pt transferring to [id number] via w/c with belongings. [name]

## 2012-03-27 DIAGNOSIS — R1310 Dysphagia, unspecified: Secondary | ICD-10-CM

## 2012-03-27 DIAGNOSIS — J690 Pneumonitis due to inhalation of food and vomit: Secondary | ICD-10-CM

## 2012-03-27 LAB — GLUCOSE, CAPILLARY
Glucose-Capillary: 120 mg/dL — ABNORMAL HIGH (ref 70–99)
Glucose-Capillary: 130 mg/dL — ABNORMAL HIGH (ref 70–99)
Glucose-Capillary: 143 mg/dL — ABNORMAL HIGH (ref 70–99)
Glucose-Capillary: 144 mg/dL — ABNORMAL HIGH (ref 70–99)

## 2012-03-27 LAB — COMPREHENSIVE METABOLIC PANEL
ALT: 29 U/L (ref 0–35)
AST: 25 U/L (ref 0–37)
Calcium: 8.6 mg/dL (ref 8.4–10.5)
Sodium: 138 mEq/L (ref 135–145)
Total Protein: 6.2 g/dL (ref 6.0–8.3)

## 2012-03-27 MED ORDER — ENSURE COMPLETE PO LIQD
237.0000 mL | Freq: Three times a day (TID) | ORAL | Status: DC
  Administered 2012-03-27 – 2012-03-31 (×11): 237 mL via ORAL

## 2012-03-27 MED ORDER — AMOXICILLIN-POT CLAVULANATE 500-125 MG PO TABS
1.0000 | ORAL_TABLET | Freq: Three times a day (TID) | ORAL | Status: AC
  Administered 2012-03-27 – 2012-03-30 (×12): 500 mg via ORAL
  Filled 2012-03-27 (×12): qty 1

## 2012-03-27 MED ORDER — CLINIMIX E/DEXTROSE (5/20) 5 % IV SOLN
INTRAVENOUS | Status: AC
  Administered 2012-03-27: 18:00:00 via INTRAVENOUS
  Filled 2012-03-27: qty 2000

## 2012-03-27 NOTE — Progress Notes (Signed)
I have reviewed and agree with this treatment session. Ignacia Palma, [place] [phone] 03/27/2012

## 2012-03-27 NOTE — Progress Notes (Addendum)
PARENTERAL NUTRITION CONSULT NOTE  Pharmacy Consult for TPN (Day # 11) Indication: Prolonged ileus   Allergies  Allergen Reactions  . Indomethacin Anaphylaxis and Other (See Comments)    "took it fine for awhile; one day I stopped breathing and I ended up in the hospital" (01/02/2012)   Patient Measurements: Height: 5\' 1"  (154.9 cm) Weight: 145 lb 11.2 oz (66.089 kg) (a scale) IBW/kg (Calculated) : 47.8 Adjusted Body Weight: 53.2 Usual Weight: 65.7  Vital Signs: Temp: 98.4 F (36.9 C) (03/27 0618) Temp src: Oral (03/27 0618) BP: 125/57 mmHg (03/27 0618) Pulse Rate: 78 (03/27 0618) Intake/Output from previous day: 03/26 0701 - 03/27 0700 In: 360 [I.V.:100; TPN:260] Out: 1400 [Urine:975; Stool:425] Intake/Output from this shift:   Labs: No results found for this basename: WBC, HGB, HCT, PLT, APTT, INR,  in the last 72 hours   Recent Labs  03/26/12 0511 03/27/12 0522  NA  --  138  K  --  3.8  CL  --  102  CO2  --  27  GLUCOSE  --  138*  BUN  --  27*  CREATININE 0.67 0.64  CALCIUM  --  8.6  MG  --  2.6*  PHOS  --  3.5  PROT  --  6.2  ALBUMIN  --  2.4*  AST  --  25  ALT  --  29  ALKPHOS  --  64  BILITOT  --  0.3   Estimated Creatinine Clearance: 45.5 ml/min (by C-G formula based on Cr of 0.64).   Recent Labs  03/26/12 1208 03/26/12 1616 03/26/12 2005  GLUCAP 142* 144* 133*   Medical History: Past Medical History  Diagnosis Date  . Hypertension   . Goiter     radioactive iodine ablation/notes 01/02/2012  . COPD (chronic obstructive pulmonary disease)   . CHF (congestive heart failure)   . Pneumonia ?01/02/2012; ? 2009  . SOB (shortness of breath)     'all the time" (01/02/2012)  . Hypothyroidism     "I have taken Synthroid before" (01/02/2012)  . Arthritis     "right shoulder" (01/02/2012)  . Diverticulitis    Medications:  Scheduled:  . acetaminophen  500 mg Oral TID  . antiseptic oral rinse  15 mL Mouth Rinse q12n4p  . budesonide (PULMICORT)  nebulizer solution  0.25 mg Nebulization BID  . chlorhexidine  15 mL Mouth Rinse BID  . enoxaparin (LOVENOX) injection  40 mg Subcutaneous Q24H  . furosemide  40 mg Intravenous Daily  . lisinopril  10 mg Oral Daily  . [COMPLETED] magnesium sulfate 1 - 4 g bolus IVPB  2 g Intravenous Once  . piperacillin-tazobactam (ZOSYN)  IV  3.375 g Intravenous Q8H  . polyethylene glycol  17 g Oral Daily  . QUEtiapine  50 mg Oral QHS  . [DISCONTINUED] albuterol  2.5 mg Nebulization Q6H  . [DISCONTINUED] ipratropium  0.5 mg Nebulization Q6H   Insulin Requirements in the past 24 hours: SSI discontinued and CBG's have remained within range.  Current Nutrition:  She tolerated clear liquids and is advancing to full liquids today.   Clinimix E5/20 at 65 ml/hr (goal, provides 78g protein per day and an average of ~1580 kcal/day).  Her prealbumin is trending up 11.7 >> 16.4 and is just below desired goal.  Nutritional Goals:  1640-1820 kCal, 70-80 grams of protein per day  Assessment: Admitted 3/7 with abdominal pain x 1 week.  She has lost 30lb in last 15 months due to abdominal  pain.  Now s/p sigmoid resection, Hartman's pouch, & end colostomy for smoldering diverticulitis; prolonged ileus.    IV Pump:  Verified rate on IV pump, running correctly without noted complications.  GI:  POD#15, prolonged ileus - now resolving per MD note.  Now passing stool into her ostomy.  She has had difficulties with PO intake through her course - poor appetite, possible aspiration, delirium - and this appears to be improving.  Per CCS, will continue TPN until her PO intake improves.  She had a modified barium swallow evaluation yesterday which revealed the need for continued liquid diet.  No documentation of intake recorded yesterday.  Endo:  No hx DM.  CBGs have been well-controlled and < 150, her SSI coverage was discontinued on 3/20.  Hx goiter, radioiodine tx , on no thyroid replacement pta.    Lytes:  No new labs today  but electrolytes were ok 3/24. IVF: NS at [facility].  Renal:  Cr stable at 0.7, continues to have stable UOP on IV Lasix for diuresis.  Pulm:  Hx COPD, wheezing - now back on Atrovent/Albuterol/Budesonide nebs.   Cards:  Hx HTN, CHF.  VSS.  Lisinopril, Lasix 40mg  IV qday for volume overload.   Hepatobil:   LFTs wnl on 3/20.  Cholesterol and triglycerides are fine.  Neuro: Acute delirium - have now added Seroquel QHS and Geodon prn.  Vicodin for pain control.  QTc this AM 0.4.  ID:   Aspiration PNA - Day #7 Zosyn.  AFeb, WBC 9.5; s/p Invanz 3/7>3/18 for Diverticulitis & abscess.  Best Practices:  Lovenox VTE px TPN Access:  PICC for TNA   Plan:  Continue Clinimix E 5/20 at 65 ml/hr. Provide lipids, MVI, and trace elements on Mon-Wed-Fri only due to national backorder. Continue NS at [facility] TPN labs in AM Follow-up tolerance and advancement of diet for potential discontinuation of TPN.  If full liquid diet is tolerated will consider decreasing TPN rate to stimulate appetite.  Nadara Mustard, PharmD., MS Clinical Pharmacist Pager:  [phone] Thank you for allowing pharmacy to be part of this patients care team.  03/27/2012, 8:38 AM

## 2012-03-27 NOTE — Consult Note (Signed)
WOC ostomy follow-up consult  Stoma type/location: Colostomy to left lower quad Stomal assessment/size: Stoma red and viable, flush with skin level, 1 1/4 inches Peristomal assessment: Intact skin surrounding Output 50cc semi-formed stool Ostomy pouching: 1pc with barrier ring.  Education provided: Pt did not participate in pouch change.  Much less confused than previous visit and asked appropriate questions.  Will need total assistance with pouch change after discharge.  No family members present during pouch change.  Demonstrated application process using one piece pouch and barrier ring to maintain seal. Supplies ordered to bedside for staff use. Cammie Mcgee MSN, RN, CWOCN, [place], CNS [phone]

## 2012-03-27 NOTE — Progress Notes (Signed)
Patient ID: [name]  female  WUJ:[id number]    DOB: 04-14-1925    DOA: 03/07/2012  PCP: Kaleen Mask, MD  Assessment/Plan:  Acute delirium: Unclear etiology, but continue improving. Possibility of toxic metabolic encephalopathy due to bronchitis and presumed UTI; also due to residual anesthesia effect, Meds (narcotics, tramadol and flexeril, ambien, lack of sleep (per family, she had not slept for several days). No CO2 narcosis, ammonia level was normal.  -Cont zosyn -continue restoril -minimize pain meds -continue seroquel -awake during daytime and sleeping at night time -per psych rec's will use PRN geodon for agitation.  Recurrent acute sigmoid diverticulitis with intra-abdominal abscess:   -Prolonged post-op ileus. POD#14 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  - restarted diet on 3/25 after swallow eval, on TPN until nutrition/intake improved and tolerated - CCS closely following  Hypertension: resume lisinopril  History of Chronic diastolic CHF (congestive heart failure): fluid overloaded after starting TPN,  -had been on IVF BNP 3342 on 3/19 but improved to 821 on 3/24) -EF 55-60% based on 2D Echo on 02/25/2009, repeat ECHO demonstrated EF down to 40-45%, no wall motion abnormalities. -cont IV lasix until TNA off -continue lisinopril  Right lower lobe pneumonia, likely aspiration, continue IV Zosyn  - cont advancing diet as recommended by speech therapy  -breathing improving; WBC's WNL and patient afebrile -still with SOB on minimal exertion.  -start flutter  H/O: hypothyroidism - Stable, last TSH 1/14 normal, the patient is not on any replacement -will monitor as an outpatient  History of COPD with acute bronchitis - placed on pulmicort -change alb and atrovent nebs PRN -continue abx's; but will changed to PO  Deconditioning -continue PT/OT -SNF at discharge  Protein calorie malnutrition (moderate) due to acute illness/  Hypoalbuminemia: -continue TNA -continue advancing diet -start ensure  Bigeminy and V-tach: -no sustained and asymptomatic -electrolytes repleted yesterday -monitor on telemetry.  DVT Prophylaxis: lovenox  Code Status:Full  Disposition: continue telemetry bed; follow improvement/stability prior to discharge; will need SNF  Subjective: Mental status continue improving and today oriented X 3 and totally appropriate. Yesterday experienced no sustained bigeminy and 9 beats of V-tach (patient asymptomatic). Feeling mild SOB with minimal exertion.  Objective: Weight change:   Intake/Output Summary (Last 24 hours) at 03/27/12 0927 Last data filed at 03/27/12 0909  Gross per 24 hour  Intake    375 ml  Output   1325 ml  Net   -950 ml   Blood pressure 125/57, pulse 78, temperature 98.4 F (36.9 C), temperature source Oral, resp. rate 20, height 5\' 1"  (1.549 m), weight 66.1 kg (145 lb 11.6 oz), SpO2 97.00%.  Physical Exam: General: alert, awake and oriented X2, responding appropriately to all questions and at this point with good insight of her situation. Afebrile. CVS: S1-S2, regular rate; no rubs or gallops Chest: no wheezing, no crackles and scattered rhonchi Abdomen:  Wound intact and healing properly, no redness and no drainage; colostomy with positive output, mild distension , positive BS and mild discomfort Extremities: no c/c/e   Lab Results: Basic Metabolic Panel:  Recent Labs Lab 03/24/12 0635 03/26/12 0511 03/27/12 0522  NA 139  --  138  K 3.7  --  3.8  CL 100  --  102  CO2 30  --  27  GLUCOSE 143*  --  138*  BUN 24*  --  27*  CREATININE 0.65 0.67 0.64  CALCIUM 8.8  --  8.6  MG 2.2  --  2.6*  PHOS 3.6  --  3.5   Liver Function Tests:  Recent Labs Lab 03/24/12 0635 03/27/12 0522  AST 23 25  ALT 16 29  ALKPHOS 64 64  BILITOT 0.6 0.3  PROT 6.3 6.2  ALBUMIN 2.7* 2.4*   CBC:  Recent Labs Lab 03/23/12 0505 03/24/12 0635  WBC 9.7 9.5  NEUTROABS   --  7.3  HGB 11.8* 11.1*  HCT 32.8* 32.9*  MCV 89.4 88.9  PLT 328 300   CBG:  Recent Labs Lab 03/26/12 0245 03/26/12 0814 03/26/12 1208 03/26/12 1616 03/26/12 2005  GLUCAP 101* 146* 142* 144* 133*     Micro Results: Recent Results (from the past 240 hour(s))  URINE CULTURE     Status: None   Collection Time    03/19/12 10:21 AM      Result Value Range Status   Specimen Description URINE, CLEAN CATCH   Final   Special Requests NONE   Final   Culture  Setup Time 03/19/2012 12:09   Final   Colony Count NO GROWTH   Final   Culture NO GROWTH   Final   Report Status 03/20/2012 FINAL   Final    Studies/Results: Ct Abdomen Pelvis W Contrast  03/07/2012  *RADIOLOGY REPORT*  Clinical Data: Lower abdominal pain, history diverticulitis, CHF, COPD  CT ABDOMEN AND PELVIS WITH CONTRAST  Technique:  Multidetector CT imaging of the abdomen and pelvis was performed following the standard protocol during bolus administration of intravenous contrast. Sagittal and coronal MPR images reconstructed from axial data set.  Contrast: 80mL OMNIPAQUE IOHEXOL 300 MG/ML  SOLN Dilute oral contrast.  Comparison: 02/07/2012  Findings: Minimal atelectasis posterior right lower lobe base. Improved atelectasis left lower lobe. Mitral annular calcification noted. Bilateral renal cysts. Liver, spleen, pancreas, with kidneys, and adrenal glands otherwise normal appearance.  Stomach incompletely distended, suboptimal assessment wall thickness. Small umbilical hernia containing fat. Diverticulosis of descending and sigmoid colon. Wall thickening identified at mid sigmoid colon with improved pericolic inflammatory changes compatible with acute diverticulitis. Extraluminal gas collections medial to the thickened sigmoid segment again seen compatible with contained perforation within the mesocolon. The presence of minimal high attenuation within this collection raises the possibility that this collection demonstrates continued  communication with the adjacent sigmoid colon lumen.  No discrete drainable fluid/abscess collection identified. No free intraperitoneal air. Small bowel loops and remainder colon showed no additional acute changes. Scattered atherosclerotic calcifications. No mass, adenopathy, free to free of fluid, or hernia. Bones diffusely demineralized.  IMPRESSION: Persistent acute diverticulitis changes of the sigmoid colon with wall thickening and further improved pericolic infiltrative changes. Persistent visualization of extraluminal gas collections within the sigmoid mesocolon at site of prior diverticular abscess, containing a small amount of central high attenuation which could represent GI contrast and indicate continued communication of this collection with the sigmoid lumen. Diverticulosis of sigmoid and descending colon.   Original Report Authenticated By: Ulyses Southward, M.D.     Medications: Scheduled Meds: . acetaminophen  500 mg Oral TID  . antiseptic oral rinse  15 mL Mouth Rinse q12n4p  . budesonide (PULMICORT) nebulizer solution  0.25 mg Nebulization BID  . chlorhexidine  15 mL Mouth Rinse BID  . enoxaparin (LOVENOX) injection  40 mg Subcutaneous Q24H  . furosemide  40 mg Intravenous Daily  . lisinopril  10 mg Oral Daily  . piperacillin-tazobactam (ZOSYN)  IV  3.375 g Intravenous Q8H  . polyethylene glycol  17 g Oral Daily  . QUEtiapine  50  mg Oral QHS      LOS: 20 days   Graesyn Schreifels M.D. [facility] 03/27/2012, 9:27 AM Pager: [phone]  If 7PM-7AM, please contact night-coverage [email or web address] Password [email or web address]

## 2012-03-27 NOTE — Progress Notes (Signed)
Speech Language Pathology Dysphagia Treatment Patient Details Name: [name] MRN: [id number] DOB: [birth year] Today's Date: 03/27/2012 Time: 1030-1055 SLP Time Calculation (min): 25 min  Assessment / Plan / Recommendation Clinical Impression  Pt seen following MBS to reinforce safety precautions with PO. Pt seen to manage soft solids without difficulty. Initially consumed thin liquids with min verbal cues to follow precuations without evidence of struggle. After transfer to bedside commode and bask to chair, pt short of breath. Took sip of water with immediate cough reponse. Educated pt on importance of awareness of respiratory function when consuming PO. Placed signage with precautions. Education with pt, staff and family complete. If difficutly with PO continues, please reorder SLP, will sign off for now.     Diet Recommendation  Continue with Current Diet: Dysphagia 3 (mechanical soft);Thin liquid    SLP Plan     Pertinent Vitals/Pain NA   Swallowing Goals  SLP Swallowing Goals Patient will utilize recommended strategies during swallow to increase swallowing safety with: Set-up Swallow Study Goal #2 - Progress: Met  General Temperature Spikes Noted: No Respiratory Status: Supplemental O2 delivered via (comment) Behavior/Cognition: Alert;Cooperative;Pleasant mood Oral Cavity - Dentition: Dentures, bottom;Dentures, top Patient Positioning: Upright in chair  Oral Cavity - Oral Hygiene     Dysphagia Treatment Treatment focused on: Skilled observation of diet tolerance;Utilization of compensatory strategies Treatment Methods/Modalities: Skilled observation Patient observed directly with PO's: Yes Type of PO's observed: Thin liquids;Dysphagia 3 (soft) Feeding: Able to feed self Liquids provided via: Cup;Straw Pharyngeal Phase Signs & Symptoms: Immediate throat clear;Immediate cough Type of cueing: Verbal Amount of cueing: Minimal   GO    Harlon Ditty, MA CCC-SLP  [phone]  Claudine Mouton 03/27/2012, 3:10 PM

## 2012-03-27 NOTE — Progress Notes (Signed)
Occupational Therapy Treatment Patient Details Name: KLA BILY MRN: [id number] DOB: Mar 07, 1925 Today's Date: 03/27/2012 Time: 0133-0200 OT Time Calculation (min): 27 min  OT Assessment / Plan / Recommendation Comments on Treatment Session Pt is making progress.  Pt was not limited by pain during session was willing to walk and completed a toliet transfer with min guard A. Continues to need rehab at [facility] level    Follow Up Recommendations  SNF       Equipment Recommendations  Tub/shower bench       Frequency Min 2X/week   Plan Discharge plan remains appropriate    Precautions / Restrictions Precautions Precautions: Other (comment) Precaution Comments: colostomy Restrictions Weight Bearing Restrictions: No   Pertinent Vitals/Pain No denies of pain    ADL  Toilet Transfer: Performed;Min guard Toilet Transfer Method: Other (comment);Sit to stand (ambulate with RW) Toilet Transfer Equipment: Raised toilet seat with arms (or 3-in-1 over toilet) Toileting - Clothing Manipulation and Hygiene: Min guard;Performed Where Assessed - Glass blower/designer Manipulation and Hygiene: Standing Equipment Used: Gait belt;Rolling walker Transfers/Ambulation Related to ADLs: Pt was min guard for transfers and mobility using a RW ADL Comments: Pt was wheezing but, when checked O2 stats she was at 94.  Pt first asked OTA/S to complete toliet hygiene, then decided to complete toliet hygiene herself     OT Goals ADL Goals ADL Goal: Statistician - Progress: Progressing toward goals ADL Goal: Toileting - Clothing Manipulation - Progress: Progressing toward goals ADL Goal: Toileting - Hygiene - Progress: Progressing toward goals ADL Goal: Additional Goal #1 - Progress: Progressing toward goals  Visit Information  Last OT Received On: 03/27/12 Assistance Needed: +1    Subjective Data  Subjective: I am not in any pain I am frustrated      Cognition  Cognition Overall Cognitive Status:  Appears within functional limits for tasks assessed/performed Arousal/Alertness: Awake/alert Orientation Level: Appears intact for tasks assessed Behavior During Session: [facility] for tasks performed    Mobility  Bed Mobility Details for Bed Mobility Assistance: pt in chair, wanted to get in bed then decided againist because she didn't want to lay down then have to get back up Transfers Transfers: Sit to Stand;Stand to Sit Sit to Stand: 4: Min guard;From chair/3-in-1;With armrests;With upper extremity assist Stand to Sit: 4: Min guard;With upper extremity assist;With armrests;To chair/3-in-1 Details for Transfer Assistance: Was able to stand from chair,toliet, and then back to chair with Minguard A. Had gait belt applied only for safety       Balance Balance Balance Assessed: Yes Dynamic Standing Balance Dynamic Standing - Balance Support: During functional activity;No upper extremity supported Dynamic Standing - Level of Assistance:  (min guard A) Dynamic Standing - Comments: Went to walk in hallway and returned to [employer]. Needed VCs to stay inside walker why ambulating   End of Session OT - End of Session Equipment Utilized During Treatment: Gait belt;Other (comment) (RW) Activity Tolerance: Patient tolerated treatment well Patient left: in chair;with call bell/phone within reach       Dietrich Pates 03/27/2012, 3:33 PM

## 2012-03-27 NOTE — Progress Notes (Signed)
NUTRITION FOLLOW UP  Intervention:   1. Once pt is able to tolerate full liquids and D3 diet, recommend decrease TPN rate to stimulate appetite 2. Ensure Complete po BID, each supplement provides 350 kcal and 13 grams of protein. 3. RD will continue to follow    Nutrition Dx:   Inadequate oral intake, ongoing   Goal:   Meet >/=90% estimated nutrition needs. Met   Monitor:   Po intake, TPN wean, labs   Assessment:   Pt continues on TPN. Passed swallow eval, MBS, yesterday and was up graded to thin liquids. Has been tolerating clear liquids and diet advanced to full liquids and then to D3 today. Per pt, she has not had any full liquids of D3 food yet, only clear liquids today. Will try and drink an Ensure this afternoon. Very little appetite, likely from the TPN. Explained that she needs to demonstrate tolerance of full liquids and be able to meet needs before TPN will be d/c'd. Pt understood and will try to eat more today.   Patient is receiving TPN with Clinimix E 5/20  @ 65 ml/hr.  Lipids (20% IVFE @ 10 ml/hr), multivitamins, and trace elements are provided 3 times weekly (MWF) due to national backorder.  Provides 1580 kcal and 78 grams protein daily (based on weekly average).  Meets 100% minimum estimated kcal and 100% minimum estimated protein needs.   Height: Ht Readings from Last 1 Encounters:  03/26/12 5\' 1"  (1.549 m)    Weight Status:   Wt Readings from Last 1 Encounters:  03/27/12 145 lb 11.6 oz (66.1 kg)    Re-estimated needs:  Kcal: 1500-1700  Protein: 75-90 g  Fluid: 1.5 L/day   Skin: abdominal incision   Diet Order: Dysphagia 3, thin liquids   Intake/Output Summary (Last 24 hours) at 03/27/12 1137 Last data filed at 03/27/12 1005  Gross per 24 hour  Intake    120 ml  Output    675 ml  Net   -555 ml    Last BM: 3/26   Labs:   Recent Labs Lab 03/22/12 0540 03/23/12 0505 03/24/12 0635 03/26/12 0511 03/27/12 0522  NA 139 137 139  --  138  K 4.0  3.7 3.7  --  3.8  CL 97 97 100  --  102  CO2 34* 31 30  --  27  BUN 22 21 24*  --  27*  CREATININE 0.60 0.62 0.65 0.67 0.64  CALCIUM 9.0 9.1 8.8  --  8.6  MG 2.0  --  2.2  --  2.6*  PHOS  --   --  3.6  --  3.5  GLUCOSE 122* 135* 143*  --  138*    CBG (last 3)   Recent Labs  03/27/12 0129 03/27/12 0500 03/27/12 1120  GLUCAP 152* 143* 130*    Scheduled Meds: . acetaminophen  500 mg Oral TID  . amoxicillin-clavulanate  1 tablet Oral TID  . antiseptic oral rinse  15 mL Mouth Rinse q12n4p  . budesonide (PULMICORT) nebulizer solution  0.25 mg Nebulization BID  . chlorhexidine  15 mL Mouth Rinse BID  . enoxaparin (LOVENOX) injection  40 mg Subcutaneous Q24H  . feeding supplement  237 mL Oral TID BM  . furosemide  40 mg Intravenous Daily  . lisinopril  10 mg Oral Daily  . polyethylene glycol  17 g Oral Daily  . QUEtiapine  50 mg Oral QHS    Continuous Infusions: . Marland KitchenTPN (CLINIMIX-E) Adult    .  sodium chloride 20 mL/hr at 03/24/12 1218  . Marland KitchenTPN (CLINIMIX-E) Adult 65 mL/hr at 03/26/12 1724   And  . fat emulsion 250 mL (03/26/12 1725)    Clarene Duke RD, LDN Pager [phone] After Hours pager [phone]

## 2012-03-27 NOTE — Progress Notes (Signed)
15 Days Post-Op  Subjective: Better, no abdominal pain, tolerated fulls, some SOB  Objective: Vital signs in last 24 hours: Temp:  [97.6 F (36.4 C)-98.9 F (37.2 C)] 98.4 F (36.9 C) (03/27 0618) Pulse Rate:  [74-84] 78 (03/27 0618) Resp:  [20-35] 20 (03/27 0618) BP: (99-152)/(46-74) 125/57 mmHg (03/27 0618) SpO2:  [93 %-97 %] 97 % (03/27 0618) Weight:  [63.3 kg (139 lb 8.8 oz)-66.089 kg (145 lb 11.2 oz)] 66.089 kg (145 lb 11.2 oz) (03/27 0618) Last BM Date: 03/26/12  Intake/Output from previous day: 03/26 0701 - 03/27 0700 In: 360 [I.V.:100; TPN:260] Out: 1400 [Urine:975; Stool:425] Intake/Output this shift:    General appearance: alert and cooperative Resp: clear but RR 28 Cardio: S1, S2 normal GI: soft, NT incision CDI, colostomy with liquid and air output EXT: calves soft  Lab Results:  No results found for this basename: WBC, HGB, HCT, PLT,  in the last 72 hours BMET  Recent Labs  03/26/12 0511 03/27/12 0522  NA  --  138  K  --  3.8  CL  --  102  CO2  --  27  GLUCOSE  --  138*  BUN  --  27*  CREATININE 0.67 0.64  CALCIUM  --  8.6   PT/INR No results found for this basename: LABPROT, INR,  in the last 72 hours ABG  Recent Labs  03/24/12 1125  PHART 7.422  HCO3 29.6*    Studies/Results: Dg Swallowing Func-speech Pathology  03/26/2012  [name], CCC-SLP     03/26/2012  2:28 PM Objective Swallowing Evaluation: Modified Barium Swallowing Study   Patient Details  Name: [name] MRN: [id number] Date of Birth: 05/17/1925  Today's Date: 03/26/2012 Time: 1310-1340 SLP Time Calculation (min): 30 min  Past Medical History:  Past Medical History  Diagnosis Date  . Hypertension   . Goiter     radioactive iodine ablation/notes 01/02/2012  . COPD (chronic obstructive pulmonary disease)   . CHF (congestive heart failure)   . Pneumonia ?01/02/2012; ? 2009  . SOB (shortness of breath)     'all the time" (01/02/2012)  . Hypothyroidism     "I have taken Synthroid before"  (01/02/2012)  . Arthritis     "right shoulder" (01/02/2012)  . Diverticulitis    Past Surgical History:  Past Surgical History  Procedure Laterality Date  . Cataract extraction w/ intraocular lens  implant, bilateral   ?1990's  . Appendectomy  1980's    "when they did hysterectomy" (01/02/2012)  . Abdominal hysterectomy  1980's  . Colostomy revision N/A 03/12/2012    Procedure: COLON RESECTION SIGMOID ;  Surgeon: Axel Filler,  MD;  Location: [facility];  Service: General;  Laterality: N/A;  . Colostomy N/A 03/12/2012    Procedure: COLOSTOMY;  Surgeon: Axel Filler, MD;  Location:  [facility] OR;  Service: General;  Laterality: N/A;   HPI:  Pt currently tolerating nectar thick liquids. Objective study  recommended to determine appropriateness for advancing diet to  thin.     Assessment / Plan / Recommendation Clinical Impression  Dysphagia Diagnosis: Mild pharyngeal phase dysphagia Clinical impression: Mild pharyngeal dysphagia characterized  primarily by delayed swallow reflex, which resulted in vallecular  pooling of puree and pyriform pooling of thin liquid.  Flash  penetration was noted intermittently on thin liquids, but  penetrate was cleared spontaneously and was not aspirated.  Pt  tolerated solid consistency and barium tablet without difficulty.  Recommend advancing diet to dys 3/thin liquids when  ok per  surgery.  In the interim, will advance liquids to thin and follow  for diet tolerance.    Treatment Recommendation  Therapy as outlined in treatment plan below    Diet Recommendation Thin liquid (Full liquid until surgery ok's  dys 3 solids)   Liquid Administration via: Cup;Straw Medication Administration: Whole meds with liquid Supervision: Intermittent supervision to cue for compensatory  strategies Compensations: Slow rate;Small sips/bites Postural Changes and/or Swallow Maneuvers: Seated upright 90  degrees    Other  Recommendations Oral Care Recommendations: Oral care BID   Follow Up Recommendations  None     Frequency and Duration min 1 x/week  1 week   Pertinent Vitals/Pain Pt reports discomfort sitting upright    SLP Swallow Goals Patient will consume recommended diet without observed clinical  signs of aspiration with: Set-up Swallow Study Goal #1 - Progress: Progressing toward goal Patient will utilize recommended strategies during swallow to  increase swallowing safety with: Set-up Swallow Study Goal #2 - Progress: Progressing toward goal   General Date of Onset: 03/07/12 HPI: Pt currently tolerating nectar thick liquids. Objective  study recommended to determine appropriateness for advancing diet  to thin. Type of Study: Modified Barium Swallowing Study Reason for Referral: Objectively evaluate swallowing function Diet Prior to this Study: Nectar-thick liquids Temperature Spikes Noted: No Respiratory Status: Room air Behavior/Cognition: Alert;Cooperative;Pleasant mood Oral Cavity - Dentition: Dentures, bottom;Dentures, top Oral Motor / Sensory Function: Within functional limits Self-Feeding Abilities: Able to feed self Patient Positioning: Upright in bed Baseline Vocal Quality: Clear Volitional Cough: Weak Volitional Swallow: Able to elicit Anatomy: Within functional limits Pharyngeal Secretions: Not observed secondary MBS    Reason for Referral Objectively evaluate swallowing function   Oral Phase Oral Preparation/Oral Phase Oral Phase: WFL   Pharyngeal Phase Pharyngeal Phase Pharyngeal Phase: Impaired Pharyngeal - Thin Pharyngeal - Thin Straw: Delayed swallow initiation;Premature  spillage to pyriform sinuses;Premature spillage to  valleculae;Reduced airway/laryngeal  closure;Penetration/Aspiration during swallow Penetration/Aspiration details (thin straw): Material enters  airway, remains ABOVE vocal cords then ejected out;Material does  not enter airway Pharyngeal - Solids Pharyngeal - Puree: Premature spillage to valleculae;Delayed  swallow initiation  Cervical Esophageal Phase    [name],  CCC-SLP [phone]  Cervical Esophageal Phase Cervical Esophageal Phase: [facility]         Leigh Aurora 03/26/2012, 2:27 PM      Anti-infectives: Anti-infectives   Start     Dose/Rate Route Frequency Ordered Stop   03/20/12 0900  piperacillin-tazobactam (ZOSYN) IVPB 3.375 g     3.375 g 12.5 mL/hr over 240 Minutes Intravenous 3 times per day 03/20/12 0839     03/08/12 1900  ertapenem (INVANZ) 1 g in sodium chloride 0.9 % 50 mL IVPB  Status:  Discontinued     1 g 100 mL/hr over 30 Minutes Intravenous Every 24 hours 03/08/12 1408 03/19/12 1106   03/07/12 1800  ertapenem (INVANZ) 1 g in sodium chloride 0.9 % 50 mL IVPB     1 g 100 mL/hr over 30 Minutes Intravenous To Emergency Dept 03/07/12 1721 03/07/12 1955   03/07/12 1730  ertapenem (INVANZ) 1 g in sodium chloride 0.9 % 50 mL IVPB  Status:  Discontinued     1 g 100 mL/hr over 30 Minutes Intravenous  Once 03/07/12 1716 03/07/12 1717      Assessment/Plan: s/p Procedure(s): COLON RESECTION SIGMOID  (N/A) COLOSTOMY (N/A) 1. S/p Hartman's for tics 2. Delirium 3. Deconditioning 4. Dysphagia, improved 5. PCM/TNA  Plan: 1. Will advance diet, Speech Tx following 2. Other medical problems per medicine 3. Continue mobilization 4. Cont TNA until begins to take more po   LOS: 20 days    Tjuana Vickrey E 03/27/2012

## 2012-03-27 NOTE — Progress Notes (Signed)
Physical Therapy Treatment Patient Details Name: [name] MRN: [id number] DOB: May 10, 1925 Today's Date: 03/27/2012 Time: 4098-1191 PT Time Calculation (min): 24 min  PT Assessment / Plan / Recommendation Comments on Treatment Session  Patient progressing with activity tolerance walking further with SpO2 WNL, just dyspneic.  She doesn't complain of pain today during treatment as well.  Agree with STSNF to allow return home alone with intermittent help.    Follow Up Recommendations  SNF           Equipment Recommendations  None recommended by PT       Frequency Min 3X/week   Plan Discharge plan remains appropriate    Precautions / Restrictions Precautions Precautions: None Precaution Comments: colostomy Restrictions Weight Bearing Restrictions: No   Pertinent Vitals/Pain No complaints    Mobility  Bed Mobility Details for Bed Mobility Assistance: pt in chair Transfers Sit to Stand: 5: Supervision;From chair/3-in-1 Stand to Sit: 5: Supervision;To chair/3-in-1 Details for Transfer Assistance: at times able to stand without assist of hands Ambulation/Gait Ambulation/Gait Assistance: 5: Supervision;4: Min guard Ambulation Distance (Feet): 150 Feet Assistive device: Rolling walker Ambulation/Gait Assistance Details: stays up inside walker, but mainly flexed posture, takes breaks to stretch and stand erect or even reverse lordosis, but needs steadying assist due to loss of balance posterior; amb on room air, SpO2 93-97%, HR 93, RR 40 Gait Pattern: Step-through pattern;Shuffle;Trunk flexed     PT Goals Acute Rehab PT Goals Pt will go Sit to Stand: with modified independence;with upper extremity assist PT Goal: Sit to Stand - Progress: Progressing toward goal Pt will Ambulate: >150 feet;with modified independence;with rolling walker PT Goal: Ambulate - Progress: Progressing toward goal  Visit Information  Last PT Received On: 03/27/12 Assistance Needed: +1     Subjective Data  Subjective: Need to sit on pot if they give me lasix   Cognition  Cognition Overall Cognitive Status: Appears within functional limits for tasks assessed/performed Arousal/Alertness: Awake/alert Orientation Level: Appears intact for tasks assessed Behavior During Session: [facility] for tasks performed    Balance  Dynamic Standing Balance Dynamic Standing - Balance Support: During functional activity;Left upper extremity supported Dynamic Standing - Level of Assistance: 5: Stand by assistance Dynamic Standing - Comments: performing perineal hygiene after toileting; also during stepping though narrow place in room with walker going sideways with cues and SBA  End of Session PT - End of Session Equipment Utilized During Treatment: Gait belt Activity Tolerance: Patient limited by fatigue Patient left: in chair;with family/visitor present   GP     [facility] 03/27/2012, 2:17 PM Sheran Lawless, PT [phone] 03/27/2012

## 2012-03-27 NOTE — Progress Notes (Signed)
CSW spoke to patient's daughter [name] today regarding continued plan for d/c to [facility] when medically stable.  Awaiting MD's decision regarding date of stability. Bed is available at SNF when stable.  Lorri Frederick. [place]  [phone]

## 2012-03-28 LAB — GLUCOSE, CAPILLARY
Glucose-Capillary: 133 mg/dL — ABNORMAL HIGH (ref 70–99)
Glucose-Capillary: 134 mg/dL — ABNORMAL HIGH (ref 70–99)
Glucose-Capillary: 121 mg/dL — ABNORMAL HIGH (ref 70–99)
Glucose-Capillary: 102 mg/dL — ABNORMAL HIGH (ref 70–99)

## 2012-03-28 LAB — CREATININE, SERUM: GFR calc Af Amer: >90 mL/min (ref >90)

## 2012-03-28 MED ORDER — TEMAZEPAM 7.5 MG PO CAPS: 7.5000 mg | ORAL_CAPSULE | Freq: Every evening | ORAL | Status: DC | PRN

## 2012-03-28 MED ORDER — FUROSEMIDE 20 MG PO TABS: 40.0000 mg | ORAL_TABLET | Freq: Every day | ORAL | Status: DC

## 2012-03-28 MED ORDER — TRACE MINERALS CR-CU-F-FE-I-MN-MO-SE-ZN IV SOLN
INTRAVENOUS | Status: AC
  Administered 2012-03-28: 18:00:00 via INTRAVENOUS
  Filled 2012-03-28: qty 1000

## 2012-03-28 MED ORDER — POLYETHYLENE GLYCOL 3350 17 G PO PACK: 17.0000 g | PACK | Freq: Every day | ORAL | Status: DC

## 2012-03-28 MED ORDER — ENSURE COMPLETE PO LIQD: 237.0000 mL | Freq: Three times a day (TID) | ORAL | Status: DC

## 2012-03-28 MED ORDER — FUROSEMIDE 40 MG PO TABS
40.0000 mg | ORAL_TABLET | Freq: Every day | ORAL | Status: DC
  Administered 2012-03-29 – 2012-03-31 (×3): 40 mg via ORAL
  Filled 2012-03-28 (×3): qty 1

## 2012-03-28 MED ORDER — FAT EMULSION 20 % IV EMUL
250.0000 mL | INTRAVENOUS | Status: AC
  Administered 2012-03-28: 250 mL via INTRAVENOUS
  Filled 2012-03-28: qty 250

## 2012-03-28 MED ORDER — HYDROCODONE-ACETAMINOPHEN 5-325 MG PO TABS: 1.0000 | ORAL_TABLET | Freq: Three times a day (TID) | ORAL | Status: DC | PRN

## 2012-03-28 MED ORDER — AMOXICILLIN-POT CLAVULANATE 500-125 MG PO TABS: 1.0000 | ORAL_TABLET | Freq: Three times a day (TID) | ORAL | Status: DC

## 2012-03-28 NOTE — Progress Notes (Signed)
16 Days Post-Op  Subjective: Taking small amounts of diet, no NV, no abd pain  Objective: Vital signs in last 24 hours: Temp:  [97.7 F (36.5 C)-98.6 F (37 C)] 98.4 F (36.9 C) (03/28 0531) Pulse Rate:  [67-93] 81 (03/28 0531) Resp:  [18-40] 19 (03/28 0531) BP: (98-141)/(46-63) 127/63 mmHg (03/28 0531) SpO2:  [94 %-97 %] 96 % (03/28 0531) Weight:  [66.1 kg (145 lb 11.6 oz)-66.951 kg (147 lb 9.6 oz)] 66.951 kg (147 lb 9.6 oz) (03/28 0531) Last BM Date: 03/27/12  Intake/Output from previous day: 03/27 0701 - 03/28 0700 In: 360 [P.O.:360] Out: 725 [Urine:725] Intake/Output this shift: Total I/O In: 120 [P.O.:120] Out: 200 [Urine:200]  General appearance: alert and cooperative Resp: clear to auscultation bilaterally GI: soft, NT, incision CDI, ostomy with stool  Lab Results:  No results found for this basename: WBC, HGB, HCT, PLT,  in the last 72 hours BMET  Recent Labs  03/27/12 0522 03/28/12 0433  NA 138  --   K 3.8  --   CL 102  --   CO2 27  --   GLUCOSE 138*  --   BUN 27*  --   CREATININE 0.64 0.59  CALCIUM 8.6  --    PT/INR No results found for this basename: LABPROT, INR,  in the last 72 hours ABG No results found for this basename: PHART, PCO2, PO2, HCO3,  in the last 72 hours  Studies/Results: Dg Swallowing Func-speech Pathology  03/26/2012  [name], CCC-SLP     03/26/2012  2:28 PM Objective Swallowing Evaluation: Modified Barium Swallowing Study   Patient Details  Name: [name] MRN: [id number] Date of Birth: 19-Jun-1925  Today's Date: 03/26/2012 Time: 1310-1340 SLP Time Calculation (min): 30 min  Past Medical History:  Past Medical History  Diagnosis Date  . Hypertension   . Goiter     radioactive iodine ablation/notes 01/02/2012  . COPD (chronic obstructive pulmonary disease)   . CHF (congestive heart failure)   . Pneumonia ?01/02/2012; ? 2009  . SOB (shortness of breath)     'all the time" (01/02/2012)  . Hypothyroidism     "I have taken Synthroid  before" (01/02/2012)  . Arthritis     "right shoulder" (01/02/2012)  . Diverticulitis    Past Surgical History:  Past Surgical History  Procedure Laterality Date  . Cataract extraction w/ intraocular lens  implant, bilateral   ?1990's  . Appendectomy  1980's    "when they did hysterectomy" (01/02/2012)  . Abdominal hysterectomy  1980's  . Colostomy revision N/A 03/12/2012    Procedure: COLON RESECTION SIGMOID ;  Surgeon: Axel Filler,  MD;  Location: [facility] OR;  Service: General;  Laterality: N/A;  . Colostomy N/A 03/12/2012    Procedure: COLOSTOMY;  Surgeon: Axel Filler, MD;  Location:  [facility] OR;  Service: General;  Laterality: N/A;   HPI:  Pt currently tolerating nectar thick liquids. Objective study  recommended to determine appropriateness for advancing diet to  thin.     Assessment / Plan / Recommendation Clinical Impression  Dysphagia Diagnosis: Mild pharyngeal phase dysphagia Clinical impression: Mild pharyngeal dysphagia characterized  primarily by delayed swallow reflex, which resulted in vallecular  pooling of puree and pyriform pooling of thin liquid.  Flash  penetration was noted intermittently on thin liquids, but  penetrate was cleared spontaneously and was not aspirated.  Pt  tolerated solid consistency and barium tablet without difficulty.  Recommend advancing diet to dys 3/thin liquids when ok per  surgery.  In the interim, will advance liquids to thin and follow  for diet tolerance.    Treatment Recommendation  Therapy as outlined in treatment plan below    Diet Recommendation Thin liquid (Full liquid until surgery ok's  dys 3 solids)   Liquid Administration via: Cup;Straw Medication Administration: Whole meds with liquid Supervision: Intermittent supervision to cue for compensatory  strategies Compensations: Slow rate;Small sips/bites Postural Changes and/or Swallow Maneuvers: Seated upright 90  degrees    Other  Recommendations Oral Care Recommendations: Oral care BID   Follow Up Recommendations  None     Frequency and Duration min 1 x/week  1 week   Pertinent Vitals/Pain Pt reports discomfort sitting upright    SLP Swallow Goals Patient will consume recommended diet without observed clinical  signs of aspiration with: Set-up Swallow Study Goal #1 - Progress: Progressing toward goal Patient will utilize recommended strategies during swallow to  increase swallowing safety with: Set-up Swallow Study Goal #2 - Progress: Progressing toward goal   General Date of Onset: 03/07/12 HPI: Pt currently tolerating nectar thick liquids. Objective  study recommended to determine appropriateness for advancing diet  to thin. Type of Study: Modified Barium Swallowing Study Reason for Referral: Objectively evaluate swallowing function Diet Prior to this Study: Nectar-thick liquids Temperature Spikes Noted: No Respiratory Status: Room air Behavior/Cognition: Alert;Cooperative;Pleasant mood Oral Cavity - Dentition: Dentures, bottom;Dentures, top Oral Motor / Sensory Function: Within functional limits Self-Feeding Abilities: Able to feed self Patient Positioning: Upright in bed Baseline Vocal Quality: Clear Volitional Cough: Weak Volitional Swallow: Able to elicit Anatomy: Within functional limits Pharyngeal Secretions: Not observed secondary MBS    Reason for Referral Objectively evaluate swallowing function   Oral Phase Oral Preparation/Oral Phase Oral Phase: WFL   Pharyngeal Phase Pharyngeal Phase Pharyngeal Phase: Impaired Pharyngeal - Thin Pharyngeal - Thin Straw: Delayed swallow initiation;Premature  spillage to pyriform sinuses;Premature spillage to  valleculae;Reduced airway/laryngeal  closure;Penetration/Aspiration during swallow Penetration/Aspiration details (thin straw): Material enters  airway, remains ABOVE vocal cords then ejected out;Material does  not enter airway Pharyngeal - Solids Pharyngeal - Puree: Premature spillage to valleculae;Delayed  swallow initiation  Cervical Esophageal Phase    [name] [facility],  CCC-SLP [phone]  Cervical Esophageal Phase Cervical Esophageal Phase: [facility]         Leigh Aurora 03/26/2012, 2:27 PM      Anti-infectives: Anti-infectives   Start     Dose/Rate Route Frequency Ordered Stop   03/27/12 1100  amoxicillin-clavulanate (AUGMENTIN) 500-125 MG per tablet 500 mg     1 tablet Oral 3 times daily 03/27/12 0951 03/31/12 0959   03/20/12 0900  piperacillin-tazobactam (ZOSYN) IVPB 3.375 g  Status:  Discontinued     3.375 g 12.5 mL/hr over 240 Minutes Intravenous 3 times per day 03/20/12 0839 03/27/12 0951   03/08/12 1900  ertapenem (INVANZ) 1 g in sodium chloride 0.9 % 50 mL IVPB  Status:  Discontinued     1 g 100 mL/hr over 30 Minutes Intravenous Every 24 hours 03/08/12 1408 03/19/12 1106   03/07/12 1800  ertapenem (INVANZ) 1 g in sodium chloride 0.9 % 50 mL IVPB     1 g 100 mL/hr over 30 Minutes Intravenous To Emergency Dept 03/07/12 1721 03/07/12 1955   03/07/12 1730  ertapenem (INVANZ) 1 g in sodium chloride 0.9 % 50 mL IVPB  Status:  Discontinued     1 g 100 mL/hr over 30 Minutes Intravenous  Once 03/07/12 1716 03/07/12 1717  Assessment/Plan: s/p Procedure(s): COLON RESECTION SIGMOID  (N/A) COLOSTOMY (N/A) 1. S/p Hartman's for tics 2. Delirium 3. Deconditioning 4. Dysphagia, improved 5. PCM/TNA  Plan: 1. Tolerating soft diet but not taking in much - order calorie count 2. Other medical problems per medicine 3. Has bed at [facility] once off TNA and OK per medical team 4. Will need to F/U with Dr. Derrell Lolling at [facility] 1-2 weeks after D/C to SNF  LOS: 21 days    Ahmira Boisselle E 03/28/2012

## 2012-03-28 NOTE — Progress Notes (Signed)
Pt. With 6 beat run of VTach. Pt. Asymptomatic. Resting in bed quietly. No signs or symptoms of distress noted. VSS. Family at bedside. On call floor coverage made aware via text page. Will continue to monitor pt. For changes in condition. [name], Cheryll Dessert

## 2012-03-28 NOTE — Progress Notes (Signed)
Calorie Count Note  48 hour calorie count ordered at 9 am this morning. TPN being weaned by pharmacy.   Diet: D3 Supplements: Ensure Complete po BID, each supplement provides 350 kcal and 13 grams of protein.   RD will follow up with calorie count results.   Clarene Duke RD, LDN Pager [phone] After Hours pager [phone]

## 2012-03-28 NOTE — Progress Notes (Signed)
Patient ID: [name]  female  ZOX:[id number]    DOB: 1925/12/22    DOA: 03/07/2012  PCP: Kaleen Mask, MD  Assessment/Plan:  Acute delirium: Unclear etiology, but continue improving. Possibility of toxic metabolic encephalopathy due to bronchitis and presumed UTI; also due to residual anesthesia effect, Meds (narcotics, tramadol and flexeril, ambien, lack of sleep (per family, she had not slept for several days). No CO2 narcosis, ammonia level was normal.  -Cont augmentin for a total of 4 more days. -continue restoril -minimize pain meds -discontinue seroquel -awake during daytime and sleeping at night time -per psych rec's will use PRN geodon for agitation.  Recurrent acute sigmoid diverticulitis with intra-abdominal abscess:   -Prolonged post-op ileus. 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  Hypertension: resume lisinopril  History of Chronic diastolic CHF (congestive heart failure): fluid overloaded after starting TPN,  -had been on IVF BNP 3342 on 3/19 but improved to 821 on 3/24) -EF 55-60% based on 2D Echo on 02/25/2009, repeat ECHO demonstrated EF down to 40-45%, no wall motion abnormalities. -cont lasix, now PO -continue lisinopril  Right lower lobe pneumonia, likely aspiration,  -cont advancing diet as recommended by speech therapy  -breathing improving; WBC's WNL and patient afebrile -still with mild SOB   -Continue flutter valve and PO abx;s  H/O: hypothyroidism -Stable, last TSH 1/14 normal, the patient is not on any replacement -will monitor as an outpatient  History of COPD with acute bronchitis -continue alb and atrovent nebs PRN -continue abx's; but will changed to PO to finish tx. -breathing a lot better not wheezing  anymore.  Deconditioning -continue PT/OT -SNF at discharge  Protein calorie malnutrition (moderate) due to acute illness/ Hypoalbuminemia: -patient able to tolerate diet better. -most likely off TNA -continue ensure  Bigeminy and V-tach: -no sustained and asymptomatic -electrolytes repleted. No further abnormalities -monitor on telemetry.  DVT Prophylaxis: lovenox  Code Status:Full  Disposition: continue telemetry bed; follow improvement/stability prior to discharge; will go to  SNF most likely in am.  Subjective: Mental status continue improving and today oriented X 3 and totally appropriate. No overnight events. Tolerating diet.  Objective: Weight change: 2.8 kg (6 lb 2.8 oz)  Intake/Output Summary (Last 24 hours) at 03/28/12 1540 Last data filed at 03/28/12 1426  Gross per 24 hour  Intake 1711.84 ml  Output   1225 ml  Net 486.84 ml   Blood pressure 131/56, pulse 76, temperature 98.6 F (37 C), temperature source Oral, resp. rate 18, height 5\' 1"  (1.549 m), weight 66.951 kg (147 lb 9.6 oz), SpO2 98.00%.  Physical Exam: General: alert, awake and oriented X3, responding appropriately to all questions and at this point with good insight of her situation. Afebrile and following commands CVS: S1-S2, regular rate; no rubs or gallops Chest: no wheezing, no crackles and just mild scattered rhonchi; good air movement bilaterally Abdomen:  Wound intact and healing properly, no redness and no drainage; colostomy with positive output, belly was soft and no distended; positive BS Extremities: no c/c/e   Lab Results: Basic Metabolic Panel:  Recent Labs Lab 03/24/12 0635  03/27/12 0522 03/28/12 0433  NA 139  --  138  --   K 3.7  --  3.8  --   CL 100  --  102  --  CO2 30  --  27  --   GLUCOSE 143*  --  138*  --   BUN 24*  --  27*  --   CREATININE 0.65  < > 0.64 0.59  CALCIUM 8.8  --  8.6  --   MG 2.2  --  2.6*  --   PHOS 3.6  --  3.5  --   < > = values in this  interval not displayed. Liver Function Tests:  Recent Labs Lab 03/24/12 0635 03/27/12 0522  AST 23 25  ALT 16 29  ALKPHOS 64 64  BILITOT 0.6 0.3  PROT 6.3 6.2  ALBUMIN 2.7* 2.4*   CBC:  Recent Labs Lab 03/23/12 0505 03/24/12 0635  WBC 9.7 9.5  NEUTROABS  --  7.3  HGB 11.8* 11.1*  HCT 32.8* 32.9*  MCV 89.4 88.9  PLT 328 300   CBG:  Recent Labs Lab 03/27/12 1632 03/27/12 2158 03/27/12 2355 03/28/12 0533 03/28/12 1119  GLUCAP 120* 151* 144* 133* 134*     Micro Results: Recent Results (from the past 240 hour(s))  URINE CULTURE     Status: None   Collection Time    03/19/12 10:21 AM      Result Value Range Status   Specimen Description URINE, CLEAN CATCH   Final   Special Requests NONE   Final   Culture  Setup Time 03/19/2012 12:09   Final   Colony Count NO GROWTH   Final   Culture NO GROWTH   Final   Report Status 03/20/2012 FINAL   Final    Studies/Results: Ct Abdomen Pelvis W Contrast  03/07/2012  *RADIOLOGY REPORT*  Clinical Data: Lower abdominal pain, history diverticulitis, CHF, COPD  CT ABDOMEN AND PELVIS WITH CONTRAST  Technique:  Multidetector CT imaging of the abdomen and pelvis was performed following the standard protocol during bolus administration of intravenous contrast. Sagittal and coronal MPR images reconstructed from axial data set.  Contrast: 80mL OMNIPAQUE IOHEXOL 300 MG/ML  SOLN Dilute oral contrast.  Comparison: 02/07/2012  Findings: Minimal atelectasis posterior right lower lobe base. Improved atelectasis left lower lobe. Mitral annular calcification noted. Bilateral renal cysts. Liver, spleen, pancreas, with kidneys, and adrenal glands otherwise normal appearance.  Stomach incompletely distended, suboptimal assessment wall thickness. Small umbilical hernia containing fat. Diverticulosis of descending and sigmoid colon. Wall thickening identified at mid sigmoid colon with improved pericolic inflammatory changes compatible with acute  diverticulitis. Extraluminal gas collections medial to the thickened sigmoid segment again seen compatible with contained perforation within the mesocolon. The presence of minimal high attenuation within this collection raises the possibility that this collection demonstrates continued communication with the adjacent sigmoid colon lumen.  No discrete drainable fluid/abscess collection identified. No free intraperitoneal air. Small bowel loops and remainder colon showed no additional acute changes. Scattered atherosclerotic calcifications. No mass, adenopathy, free to free of fluid, or hernia. Bones diffusely demineralized.  IMPRESSION: Persistent acute diverticulitis changes of the sigmoid colon with wall thickening and further improved pericolic infiltrative changes. Persistent visualization of extraluminal gas collections within the sigmoid mesocolon at site of prior diverticular abscess, containing a small amount of central high attenuation which could represent GI contrast and indicate continued communication of this collection with the sigmoid lumen. Diverticulosis of sigmoid and descending colon.   Original Report Authenticated By: Ulyses Southward, M.D.     Medications: Scheduled Meds: . acetaminophen  500 mg Oral TID  . amoxicillin-clavulanate  1 tablet Oral TID  . antiseptic oral rinse  15  mL Mouth Rinse q12n4p  . budesonide (PULMICORT) nebulizer solution  0.25 mg Nebulization BID  . chlorhexidine  15 mL Mouth Rinse BID  . enoxaparin (LOVENOX) injection  40 mg Subcutaneous Q24H  . feeding supplement  237 mL Oral TID BM  . furosemide  40 mg Intravenous Daily  . lisinopril  10 mg Oral Daily  . polyethylene glycol  17 g Oral Daily  . QUEtiapine  50 mg Oral QHS      LOS: 21 days   Shemar Plemmons M.D. [facility] 03/28/2012, 3:40 PM Pager: [phone]  If 7PM-7AM, please contact night-coverage [email or web address] Password [email or web address]

## 2012-03-28 NOTE — Progress Notes (Signed)
Chart review complete.  Patient is not eligible for [facility] Care Management services because her PCP is not a [facility] primary care provider or is not [facility] affiliated.  For any additional questions or new referrals please contact Tim Henderson BSN RN [facility] Liaison at [phone] °

## 2012-03-28 NOTE — Progress Notes (Addendum)
PARENTERAL NUTRITION CONSULT NOTE  Pharmacy Consult for TPN (Day # 12) Indication: Prolonged ileus  Allergies  Allergen Reactions  . Indomethacin Anaphylaxis and Other (See Comments)    "took it fine for awhile; one day I stopped breathing and I ended up in the hospital" (01/02/2012)   Patient Measurements: Height: 5\' 1"  (154.9 cm) Weight: 147 lb 9.6 oz (66.951 kg) (Scale A) IBW/kg (Calculated) : 47.8 Adjusted Body Weight: 53.2 Usual Weight: 65.7  Vital Signs: Temp: 98.4 F (36.9 C) (03/28 0531) Temp src: Oral (03/28 0531) BP: 127/63 mmHg (03/28 0531) Pulse Rate: 81 (03/28 0531) Intake/Output from previous day: 03/27 0701 - 03/28 0700 In: 360 [P.O.:360] Out: 725 [Urine:725] Intake/Output from this shift: Total I/O In: 120 [P.O.:120] Out: 200 [Urine:200]   Recent Labs  03/26/12 0511 03/27/12 0522 03/28/12 0433  NA  --  138  --   K  --  3.8  --   CL  --  102  --   CO2  --  27  --   GLUCOSE  --  138*  --   BUN  --  27*  --   CREATININE 0.67 0.64 0.59  CALCIUM  --  8.6  --   MG  --  2.6*  --   PHOS  --  3.5  --   PROT  --  6.2  --   ALBUMIN  --  2.4*  --   AST  --  25  --   ALT  --  29  --   ALKPHOS  --  64  --   BILITOT  --  0.3  --    Estimated Creatinine Clearance: 45.9 ml/min (by C-G formula based on Cr of 0.59).   Recent Labs  03/27/12 2158 03/27/12 2355 03/28/12 0533  GLUCAP 151* 144* 133*   Medical History: Past Medical History  Diagnosis Date  . Hypertension   . Goiter     radioactive iodine ablation/notes 01/02/2012  . COPD (chronic obstructive pulmonary disease)   . CHF (congestive heart failure)   . Pneumonia ?01/02/2012; ? 2009  . SOB (shortness of breath)     'all the time" (01/02/2012)  . Hypothyroidism     "I have taken Synthroid before" (01/02/2012)  . Arthritis     "right shoulder" (01/02/2012)  . Diverticulitis    Medications:  Scheduled:  . acetaminophen  500 mg Oral TID  . amoxicillin-clavulanate  1 tablet Oral TID  . antiseptic  oral rinse  15 mL Mouth Rinse q12n4p  . budesonide (PULMICORT) nebulizer solution  0.25 mg Nebulization BID  . chlorhexidine  15 mL Mouth Rinse BID  . enoxaparin (LOVENOX) injection  40 mg Subcutaneous Q24H  . feeding supplement  237 mL Oral TID BM  . furosemide  40 mg Intravenous Daily  . lisinopril  10 mg Oral Daily  . polyethylene glycol  17 g Oral Daily  . QUEtiapine  50 mg Oral QHS  . [DISCONTINUED] piperacillin-tazobactam (ZOSYN)  IV  3.375 g Intravenous Q8H   Insulin Requirements in the past 24 hours: SSI discontinued and CBG's have remained within range.  Current Nutrition:  She tolerated clear liquids and is advancing to full liquids today.   Clinimix E5/20 at 65 ml/hr (goal, provides 78g protein per day and an average of ~1580 kcal/day).  Her prealbumin is trending up 11.7 >> 16.4 and is just below desired goal.  Nutritional Goals:  1640-1820 kCal, 70-80 grams of protein per day  Assessment: Admitted 3/7 with  abdominal pain x 1 week.  She has lost 30lb in last 15 months due to abdominal pain.  Now s/p sigmoid resection, Hartman's pouch, & end colostomy for smoldering diverticulitis; prolonged ileus.    IV Pump:  Verified rate on IV pump, running correctly without noted complications.  GI:  POD#16, prolonged ileus - now resolving per MD note.  Now passing stool into her ostomy.  She has tolerated 50% of her meals according to flow sheet.  Spoke with Dr. Janee Morn, we will decrease TNA rate today to enhance oral intake and discontinue tomorrow.  Endo:  No hx DM.  CBGs have been well-controlled and < 150, her SSI coverage was discontinued on 3/20.  Hx goiter, radioiodine tx , on no thyroid replacement pta.    Lytes:  No new labs today but electrolytes were ok 3/27. IVF: [facility] at [facility].  Renal:  Cr stable, continues to have stable UOP on IV Lasix for diuresis.  Pulm:  Hx COPD, wheezing - now back on Atrovent/Albuterol/Budesonide nebs.   Cards:  Hx HTN, CHF.  VSS.  Lisinopril,  Lasix 40mg  IV qday for volume overload.   Hepatobil:   LFTs wnl on 3/20.  Cholesterol and triglycerides are fine.  Neuro: Acute delirium - have now added Seroquel QHS and Geodon prn.  Vicodin for pain control.  QTc this AM 0.4.  ID:   Aspiration PNA - Day #7 Zosyn.  AFeb, WBC 9.5; s/p Invanz 3/7>3/18 for Diverticulitis & abscess.  Best Practices:  Lovenox VTE px TPN Access:  PICC for TNA   Plan:   - Decrease Clinimix E 5/20 to 42 ml/hr.  - Provide lipids, MVI, and trace elements on Mon-Wed-Fri only due to national backorder. -  Continue NS at [facility] - Discontinue TNA 3/29.   Nadara Mustard, PharmD., MS Clinical Pharmacist Pager:  [phone] Thank you for allowing pharmacy to be part of this patients care team.  03/28/2012, 9:11 AM

## 2012-03-28 NOTE — Progress Notes (Addendum)
Patient's d/c was delayed as she was still on TNA today but per Pharmacist who stated that TNA would be discontinued today.  Plan was for d/c to SNF at [facility] tomorrow and CSW asked Dr. Gwenlyn Perking to complete d/c paperwork today so that d/c could be facilitated tomorrow.  Received a call late this afternoon from [place] at [facility] that due to issues beyond her control- there would not be anyone in admissions to work tomorrow- thus- would not be able to accept patient over the weekend. Dr. Gwenlyn Perking and patient notified of above.Message left for patient's daughter Jasmine December regarding above.   CSW will facilitate d/c Monday a.m.  Lorri Frederick. [place]  [phone]

## 2012-03-28 NOTE — Progress Notes (Signed)
Physical Therapy Treatment Patient Details Name: [name] MRN: [id number] DOB: 1925-12-25 Today's Date: 03/28/2012 Time: 1415-1440 PT Time Calculation (min): 25 min  PT Assessment / Plan / Recommendation Comments on Treatment Session  Amb further distance today. Education provided on improved breathing mechanics and control of breathing especially at rest. Patient excited about possible d/c tomorrow.     Follow Up Recommendations  SNF     Does the patient have the potential to tolerate intense rehabilitation     Barriers to Discharge        Equipment Recommendations  None recommended by PT    Recommendations for Other Services    Frequency Min 3X/week   Plan Discharge plan remains appropriate;Frequency remains appropriate    Precautions / Restrictions Precautions Precautions: Fall Precaution Comments: colostomy Restrictions Weight Bearing Restrictions: No   Pertinent Vitals/Pain Denies pain, DOE 2-3/4 with activity but 2 at rest as well; SpO2 98% on RA while walking    Mobility  Bed Mobility Bed Mobility: Sit to Supine Sit to Supine: HOB flat;7: Independent Transfers Sit to Stand: 4: Min guard;With upper extremity assist Stand to Sit: 4: Min guard;With upper extremity assist Details for Transfer Assistance: gaurding for stability, pivoted 3in1->bed (180 turn) using upper extremities for support Ambulation/Gait Ambulation/Gait Assistance: 5: Supervision;4: Min guard Ambulation Distance (Feet): 200 Feet Ambulation/Gait Assistance Details: needs mingaurdA as she fatigues especially when navigating through tight spaces, verbal and tactile cues for tall posture as well as deep more efficient breathing; Ambulated again on RA with dyspnea 2-3/4 needing several rest breaks, SpO2 at 98% on RA Gait Pattern: Step-through pattern;Shuffle;Trunk flexed    Exercises Other Exercises Other Exercises: incentive spirometer x 10 with cueing for technique Other Exercises: relaxation  exercises and cues for pursed lip breathing     PT Goals Acute Rehab PT Goals PT Goal: Sit to Supine/Side - Progress: Met PT Goal: Sit to Stand - Progress: Progressing toward goal PT Goal: Ambulate - Progress: Progressing toward goal PT Goal: Up/Down Stairs - Progress: Progressing toward goal  Visit Information  Last PT Received On: 03/28/12 Assistance Needed: +1    Subjective Data  Subjective: I would like to walk. Patient Stated Goal: to SNF tomorrow   Cognition  Cognition Overall Cognitive Status: Appears within functional limits for tasks assessed/performed Arousal/Alertness: Awake/alert Orientation Level: Appears intact for tasks assessed Behavior During Session: [facility] for tasks performed    Balance     End of Session PT - End of Session Equipment Utilized During Treatment: Gait belt Activity Tolerance: Patient limited by fatigue Patient left: in chair;with family/visitor present Nurse Communication: Mobility status   GP     [facility] HELEN 03/28/2012, 3:08 PM

## 2012-03-29 LAB — GLUCOSE, CAPILLARY
Glucose-Capillary: 113 mg/dL — ABNORMAL HIGH (ref 70–99)
Glucose-Capillary: 117 mg/dL — ABNORMAL HIGH (ref 70–99)
Glucose-Capillary: 119 mg/dL — ABNORMAL HIGH (ref 70–99)
Glucose-Capillary: 124 mg/dL — ABNORMAL HIGH (ref 70–99)
Glucose-Capillary: 127 mg/dL — ABNORMAL HIGH (ref 70–99)

## 2012-03-29 MED ORDER — CLINIMIX E/DEXTROSE (5/20) 5 % IV SOLN
INTRAVENOUS | Status: AC
  Administered 2012-03-29: 17:00:00 via INTRAVENOUS
  Filled 2012-03-29: qty 1000

## 2012-03-29 NOTE — Progress Notes (Signed)
Patient ID: [name]  female  ZOX:[id number]    DOB: 1926/01/02    DOA: 03/07/2012  PCP: Kaleen Mask, MD  Assessment/Plan:  Acute delirium: Unclear etiology, but continue improving. Possibility of toxic metabolic encephalopathy due to bronchitis and presumed UTI; also due to residual anesthesia effect, Meds (narcotics, tramadol and flexeril, ambien, lack of sleep). No CO2 narcosis, ammonia level was normal.  -Cont augmentin for a total of 3 more days. -continue restoril -minimize pain meds -discontinue seroquel as patient mentation now back to baseline and is not having any further delirium -awake during daytime and sleeping at night time -per psych rec's will use PRN geodon for agitation. -Rehabilitation at SNF at discharge  Recurrent acute sigmoid diverticulitis with intra-abdominal abscess:   -Prolonged post-op ileus. 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  Hypertension: Continue lisinopril  History of Chronic diastolic CHF (congestive heart failure): fluid overloaded after starting TPN,  -had been on IVF BNP 3342 on 3/19 but improved to 821 on 3/24) -EF 55-60% based on 2D Echo on 02/25/2009, repeat ECHO demonstrated EF down to 40-45%, no wall motion abnormalities. -cont lasix, now PO daily -continue lisinopril  Right lower lobe pneumonia, likely aspiration,  -cont advancing diet as recommended by speech therapy  -breathing improving; WBC's WNL and patient afebrile -mild SOB  On exertion only -Continue flutter valve and PO abx;s  H/O: hypothyroidism -Stable, last TSH 1/14 normal, the patient is not on any replacement -will monitor as an outpatient  History of COPD with acute bronchitis -continue alb and atrovent nebs  PRN -continue abx's; but will changed to PO to finish tx (now just 3 more days left). -breathing a lot better not wheezing anymore.  Deconditioning -continue PT/OT -SNF at discharge  Protein calorie malnutrition (moderate) due to acute illness/ Hypoalbuminemia: -patient able to tolerate diet better. -most likely off TNA by 3/30 in anticipation to discharge to SNF on Monday 3/31 -continue ensure TID  Bigeminy and V-tach: -no sustained and asymptomatic -electrolytes repleted. -No further abnormalities -Continue monitoring on telemetry.  DVT Prophylaxis: lovenox  Code Status:Full  Disposition: continue telemetry bed; follow improvement/stability prior to discharge; will go to  SNF on Monday (issues at SNF prevented patient to be transferred over the weekend)  Subjective: Mental status continue improving and today oriented X 3 and totally appropriate. No overnight events. Tolerating diet.  Objective: Weight change: 1.259 kg (2 lb 12.4 oz)  Intake/Output Summary (Last 24 hours) at 03/29/12 1440 Last data filed at 03/29/12 1300  Gross per 24 hour  Intake 1320.34 ml  Output   1625 ml  Net -304.66 ml   Blood pressure 129/57, pulse 73, temperature 98.6 F (37 C), temperature source Oral, resp. rate 20, height 5\' 1"  (1.549 m), weight 67.359 kg (148 lb 8 oz), SpO2 95.00%.  Physical Exam: General: alert, awake and oriented X3, responding appropriately to all questions and at this point with good insight of her situation. Afebrile and following commands CVS: S1-S2, regular rate; no rubs or gallops Chest: no wheezing, no crackles and just mild scattered rhonchi; good air movement bilaterally Abdomen:  Wound intact and healing properly, no redness and no drainage; colostomy with positive output, belly was soft and no distended; positive BS Extremities: no c/c/e  Lab Results: Basic Metabolic Panel:  Recent Labs Lab 03/24/12 0635  03/27/12 0522 03/28/12 0433  NA 139  --  138   --   K 3.7  --  3.8  --   CL 100  --  102  --   CO2 30  --  27  --   GLUCOSE 143*  --  138*  --   BUN 24*  --  27*  --   CREATININE 0.65  < > 0.64 0.59  CALCIUM 8.8  --  8.6  --   MG 2.2  --  2.6*  --   PHOS 3.6  --  3.5  --   < > = values in this interval not displayed. Liver Function Tests:  Recent Labs Lab 03/24/12 0635 03/27/12 0522  AST 23 25  ALT 16 29  ALKPHOS 64 64  BILITOT 0.6 0.3  PROT 6.3 6.2  ALBUMIN 2.7* 2.4*   CBC:  Recent Labs Lab 03/23/12 0505 03/24/12 0635  WBC 9.7 9.5  NEUTROABS  --  7.3  HGB 11.8* 11.1*  HCT 32.8* 32.9*  MCV 89.4 88.9  PLT 328 300   CBG:  Recent Labs Lab 03/28/12 1119 03/28/12 1627 03/28/12 1923 03/29/12 0023 03/29/12 0341  GLUCAP 134* 121* 102* 117* 127*    Studies/Results: Ct Abdomen Pelvis W Contrast  03/07/2012  *RADIOLOGY REPORT*  Clinical Data: Lower abdominal pain, history diverticulitis, CHF, COPD  CT ABDOMEN AND PELVIS WITH CONTRAST  Technique:  Multidetector CT imaging of the abdomen and pelvis was performed following the standard protocol during bolus administration of intravenous contrast. Sagittal and coronal MPR images reconstructed from axial data set.  Contrast: 80mL OMNIPAQUE IOHEXOL 300 MG/ML  SOLN Dilute oral contrast.  Comparison: 02/07/2012  Findings: Minimal atelectasis posterior right lower lobe base. Improved atelectasis left lower lobe. Mitral annular calcification noted. Bilateral renal cysts. Liver, spleen, pancreas, with kidneys, and adrenal glands otherwise normal appearance.  Stomach incompletely distended, suboptimal assessment wall thickness. Small umbilical hernia containing fat. Diverticulosis of descending and sigmoid colon. Wall thickening identified at mid sigmoid colon with improved pericolic inflammatory changes compatible with acute diverticulitis. Extraluminal gas collections medial to the thickened sigmoid segment again seen compatible with contained perforation within the mesocolon. The  presence of minimal high attenuation within this collection raises the possibility that this collection demonstrates continued communication with the adjacent sigmoid colon lumen.  No discrete drainable fluid/abscess collection identified. No free intraperitoneal air. Small bowel loops and remainder colon showed no additional acute changes. Scattered atherosclerotic calcifications. No mass, adenopathy, free to free of fluid, or hernia. Bones diffusely demineralized.  IMPRESSION: Persistent acute diverticulitis changes of the sigmoid colon with wall thickening and further improved pericolic infiltrative changes. Persistent visualization of extraluminal gas collections within the sigmoid mesocolon at site of prior diverticular abscess, containing a small amount of central high attenuation which could represent GI contrast and indicate continued communication of this collection with the sigmoid lumen. Diverticulosis of sigmoid and descending colon.   Original Report Authenticated By: Ulyses Southward, M.D.     Medications: Scheduled Meds: . acetaminophen  500 mg Oral TID  . amoxicillin-clavulanate  1 tablet Oral TID  . antiseptic oral rinse  15 mL Mouth Rinse q12n4p  . budesonide (PULMICORT) nebulizer solution  0.25 mg Nebulization BID  . chlorhexidine  15 mL Mouth Rinse BID  . enoxaparin (LOVENOX) injection  40 mg Subcutaneous Q24H  . feeding supplement  237 mL Oral TID BM  . furosemide  40  mg Oral Daily  . lisinopril  10 mg Oral Daily  . polyethylene glycol  17 g Oral Daily      LOS: 22 days   Nehemyah Foushee M.D. [facility] 03/29/2012, 2:40 PM Pager: [phone]  If 7PM-7AM, please contact night-coverage [email or web address] Password [email or web address]

## 2012-03-29 NOTE — Progress Notes (Signed)
PARENTERAL NUTRITION CONSULT NOTE  Pharmacy Consult for TPN (Day # 13) Indication: Prolonged ileus  Assessment: Apparently some mis-communication between social services and pharmacy.  Per conversation with Dr. Janee Morn, the plan was to continue TPN yesterday 3/28,  get a calorie count with possible discharge to [facility] on Saturday 3/29.  Per social services note, she received a call and [facility] will be unable to admit patient over the weekend.  In an effort to continue to boost this patients nutritional state, we will continue her TPN today.    Plan:   - Continue Clinimix E 5/20 at 42 ml/hr, but will discontinue tomorrow so she will be ready for  discharge on Monday.  - Cancel labs  Nadara Mustard, PharmD., MS Clinical Pharmacist Pager:  [phone] Thank you for allowing pharmacy to be part of this patients care team.  03/29/2012, 8:16 AM

## 2012-03-30 LAB — GLUCOSE, CAPILLARY
Glucose-Capillary: 116 mg/dL — ABNORMAL HIGH (ref 70–99)
Glucose-Capillary: 116 mg/dL — ABNORMAL HIGH (ref 70–99)

## 2012-03-30 NOTE — Progress Notes (Signed)
PARENTERAL NUTRITION CONSULT NOTE  Pharmacy Consult for TPN (Day # 14) Indication: Prolonged ileus  Assessment: She is obviously feeling better and able to eat more of her meals (80% of dinner) as well as her nutritional Ensure supplements..  Her TNA continued overnight as well without noted complications.  Her CBG's are well controlled without any additional insulin.    Plan:   - Discontinue TNA so she will be ready for  discharge on Monday.  - Please let us know if we can assist further in her care.  Nadara Mustard, PharmD., MS Clinical Pharmacist Pager:  [phone] Thank you for allowing pharmacy to be part of this patients care team.  03/30/2012, 7:28 AM

## 2012-03-30 NOTE — Progress Notes (Signed)
Patient ID: [name]  female  ZOX:[id number]    DOB: [birth year]    DOA: 03/07/2012  PCP: Kaleen Mask, MD  Assessment/Plan:  Acute delirium: Unclear etiology, but continue improving. Possibility of toxic metabolic encephalopathy due to bronchitis and presumed UTI; also due to residual anesthesia effect, Meds (narcotics, tramadol and flexeril, ambien, lack of sleep). No CO2 narcosis, ammonia level was normal.  -Cont augmentin for a total of 2 more days. -continue restoril -minimize pain meds -discontinue seroquel as patient mentation now back to baseline and is not having any further delirium -awake during daytime and sleeping at night time -per psych rec's will use PRN geodon for agitation. -Rehabilitation at SNF on discharge (anticipated for 3/31)  Recurrent acute sigmoid diverticulitis with intra-abdominal abscess:   -Prolonged post-op ileus. 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  Hypertension: Continue lisinopril  History of Chronic diastolic CHF (congestive heart failure): fluid overloaded after starting TPN,  -had been on IVF BNP 3342 on 3/19 but improved to 821 on 3/24) -EF 55-60% based on 2D Echo on 02/25/2009, repeat ECHO demonstrated EF down to 40-45%, no wall motion abnormalities. -cont lasix, now PO daily -continue lisinopril  Right lower lobe pneumonia, likely aspiration,  -cont advancing diet as recommended by speech therapy  -breathing improving; WBC's WNL and patient afebrile -mild SOB  On exertion only -Continue flutter valve and PO abx;s  H/O: hypothyroidism -Stable, last TSH 1/14 normal, the patient is not on any replacement -will monitor as an outpatient  History of COPD with acute bronchitis -continue alb  and atrovent nebs PRN -continue abx's; but will changed to PO to finish tx (now just 2 more days left). -breathing a lot better not wheezing anymore.  Deconditioning -continue PT/OT -SNF at discharge  Protein calorie malnutrition (moderate) due to acute illness/ Hypoalbuminemia: -patient able to tolerate diet better. -most likely off TNA today 3/30 in anticipation to discharge to SNF on Monday 3/31 -continue ensure TID  Bigeminy and V-tach: -no sustained and asymptomatic -electrolytes repleted and WNL now. -No further abnormalities -Continue monitoring on telemetry.  DVT Prophylaxis: lovenox  Code Status:Full  Disposition: continue telemetry bed; follow improvement/stability prior to discharge; will go to  SNF on Monday (issues at SNF prevented patient to be transferred over the weekend)  Subjective: Mental status continue improving and today oriented X 3 and totally appropriate. No overnight events. Tolerating diet.  Objective: Weight change: 0.045 kg (1.6 oz)  Intake/Output Summary (Last 24 hours) at 03/30/12 1618 Last data filed at 03/30/12 1500  Gross per 24 hour  Intake   1394 ml  Output   1900 ml  Net   -506 ml   Blood pressure 125/52, pulse 75, temperature 98.2 F (36.8 C), temperature source Oral, resp. rate 20, height 5\' 1"  (1.549 m), weight 67.405 kg (148 lb 9.6 oz), SpO2 96.00%.  Physical Exam: General: alert, awake and oriented X3, responding appropriately to all questions and at this point with good insight of her situation. Afebrile and following commands CVS: S1-S2, regular rate; no rubs or gallops Chest: no wheezing, no crackles and just mild scattered rhonchi; good air movement bilaterally Abdomen:  Wound intact and healing properly, no redness and no drainage; colostomy with positive output, belly was soft and no  distended; positive BS Extremities: no c/c/e   Lab Results: Basic Metabolic Panel:  Recent Labs Lab 03/24/12 0635  03/27/12 0522  03/28/12 0433  NA 139  --  138  --   K 3.7  --  3.8  --   CL 100  --  102  --   CO2 30  --  27  --   GLUCOSE 143*  --  138*  --   BUN 24*  --  27*  --   CREATININE 0.65  < > 0.64 0.59  CALCIUM 8.8  --  8.6  --   MG 2.2  --  2.6*  --   PHOS 3.6  --  3.5  --   < > = values in this interval not displayed. Liver Function Tests:  Recent Labs Lab 03/24/12 0635 03/27/12 0522  AST 23 25  ALT 16 29  ALKPHOS 64 64  BILITOT 0.6 0.3  PROT 6.3 6.2  ALBUMIN 2.7* 2.4*   CBC:  Recent Labs Lab 03/24/12 0635  WBC 9.5  NEUTROABS 7.3  HGB 11.1*  HCT 32.9*  MCV 88.9  PLT 300   CBG:  Recent Labs Lab 03/29/12 0735 03/29/12 1211 03/29/12 1604 03/29/12 2025 03/30/12 0038  GLUCAP 119* 117* 124* 113* 124*    Studies/Results: Ct Abdomen Pelvis W Contrast  03/07/2012  *RADIOLOGY REPORT*  Clinical Data: Lower abdominal pain, history diverticulitis, CHF, COPD  CT ABDOMEN AND PELVIS WITH CONTRAST  Technique:  Multidetector CT imaging of the abdomen and pelvis was performed following the standard protocol during bolus administration of intravenous contrast. Sagittal and coronal MPR images reconstructed from axial data set.  Contrast: 80mL OMNIPAQUE IOHEXOL 300 MG/ML  SOLN Dilute oral contrast.  Comparison: 02/07/2012  Findings: Minimal atelectasis posterior right lower lobe base. Improved atelectasis left lower lobe. Mitral annular calcification noted. Bilateral renal cysts. Liver, spleen, pancreas, with kidneys, and adrenal glands otherwise normal appearance.  Stomach incompletely distended, suboptimal assessment wall thickness. Small umbilical hernia containing fat. Diverticulosis of descending and sigmoid colon. Wall thickening identified at mid sigmoid colon with improved pericolic inflammatory changes compatible with acute diverticulitis. Extraluminal gas collections medial to the thickened sigmoid segment again seen compatible with contained perforation within the mesocolon. The presence of  minimal high attenuation within this collection raises the possibility that this collection demonstrates continued communication with the adjacent sigmoid colon lumen.  No discrete drainable fluid/abscess collection identified. No free intraperitoneal air. Small bowel loops and remainder colon showed no additional acute changes. Scattered atherosclerotic calcifications. No mass, adenopathy, free to free of fluid, or hernia. Bones diffusely demineralized.  IMPRESSION: Persistent acute diverticulitis changes of the sigmoid colon with wall thickening and further improved pericolic infiltrative changes. Persistent visualization of extraluminal gas collections within the sigmoid mesocolon at site of prior diverticular abscess, containing a small amount of central high attenuation which could represent GI contrast and indicate continued communication of this collection with the sigmoid lumen. Diverticulosis of sigmoid and descending colon.   Original Report Authenticated By: Ulyses Southward, M.D.     Medications: Scheduled Meds: . acetaminophen  500 mg Oral TID  . amoxicillin-clavulanate  1 tablet Oral TID  . antiseptic oral rinse  15 mL Mouth Rinse q12n4p  . budesonide (PULMICORT) nebulizer solution  0.25 mg Nebulization BID  . chlorhexidine  15 mL Mouth Rinse BID  . enoxaparin (LOVENOX) injection  40 mg Subcutaneous Q24H  . feeding supplement  237 mL Oral TID BM  . furosemide  40 mg Oral  Daily  . lisinopril  10 mg Oral Daily  . polyethylene glycol  17 g Oral Daily      LOS: 23 days   Bascom Biel M.D. [facility] 03/30/2012, 4:18 PM Pager: [phone]  If 7PM-7AM, please contact night-coverage [email or web address] Password [email or web address]

## 2012-03-30 NOTE — Progress Notes (Signed)
Ambulated patient. Walked to nurses station and back. Tolerated well. Steady gait.

## 2012-03-31 LAB — GLUCOSE, CAPILLARY: Glucose-Capillary: 96 mg/dL (ref 70–99)

## 2012-03-31 MED ORDER — AMOXICILLIN-POT CLAVULANATE 500-125 MG PO TABS: 1.0000 | ORAL_TABLET | Freq: Three times a day (TID) | ORAL | Status: AC

## 2012-03-31 MED ORDER — LISINOPRIL 20 MG PO TABS: 20.0000 mg | ORAL_TABLET | Freq: Every day | ORAL | Status: DC

## 2012-03-31 MED ORDER — POTASSIUM CHLORIDE ER 10 MEQ PO TBCR: 10.0000 meq | EXTENDED_RELEASE_TABLET | Freq: Every day | ORAL | Status: DC

## 2012-03-31 MED ORDER — BUDESONIDE-FORMOTEROL FUMARATE 160-4.5 MCG/ACT IN AERO: 2.0000 | INHALATION_SPRAY | Freq: Two times a day (BID) | RESPIRATORY_TRACT | Status: DC

## 2012-03-31 NOTE — Progress Notes (Signed)
Discharged via ambulance transport to  [facility] in [place].  Packet sent with patient.  Med list noted "Next dose due.'  Report called to Amy at facility.  Daughter took personal belongings.

## 2012-03-31 NOTE — Progress Notes (Signed)
Report called to Amy @ [facility], in [place].  Pt and daughter received colostormy care instructions this am from [place], [employer] nurse and ostomy bag changed bu [employer].  No voiced complaints form patient.   She was bedrest for 30 minutes post PICC removal earlier.  Mid-line incision with edges approximated and steri-strips intact.

## 2012-03-31 NOTE — Progress Notes (Signed)
Improving Outpatient f/u w Dr. Derrell Lolling in 2 weeks

## 2012-03-31 NOTE — Discharge Summary (Addendum)
Physician Discharge Summary  [name]:[id number] DOB: 1925-03-01 DOA: 03/07/2012  PCP: Kaleen Mask, MD  Admit date: 03/07/2012 Discharge date: 03/31/2012  Time spent: >30 minutes  Recommendations for Outpatient Follow-up:  Follow up with [facility] in 2 weeks Encourage PO intake and good hydration Take medications as prescribed BMET in 1 week to follow electrolytes and kidney function Follow a heart healthy diet Calorie count and encourage compliance with feeding supplements.  Discharge Diagnoses:  Active Problems:   Hypertension   CHF (congestive heart failure)   H/O: hypothyroidism   Intra-abdominal abscess   Diverticulitis of sigmoid colon   Altered mental state   Acute delirium   Discharge Condition: Stable and improved. Will be discharge to SNF.  Diet recommendation: heart healthy diet.  Filed Weights   03/29/12 0438 03/30/12 0507 03/31/12 0624  Weight: 67.359 kg (148 lb 8 oz) 67.405 kg (148 lb 9.6 oz) 67.314 kg (148 lb 6.4 oz)    History of present illness:  79 y.o. female with prior h/o copd, hypertension, recent admission for diverticulitis, sent home on oral antibiotics , comes back with abd pain since one week, not associated with nausea or vomiting. No diarrhea or constipation. Denies fever. On arrival she under ct abd and pelvis was found to have persistent diverticulitis but improvement int he size of the tiny abscess. Surgery was consulted by ED, recommended IV antibiotics and no surgical intervention at this time. She denies any other complaints. She will be admitted to hsopitalist service for further management.    Hospital Course:   Acute delirium: Unclear etiology, but continue improving. Possibility of toxic metabolic encephalopathy due to bronchitis and presumed UTI; also due to residual anesthesia effect, Meds (narcotics, tramadol and flexeril, ambien, lack of sleep). No CO2 narcosis, ammonia level was normal.  -Cont augmentin  for a total of 1 more day.  -continue restoril  -minimize pain meds (narcotics) -awake during daytime and sleeping at night time  -Rehabilitation at [facility] on discharge (anticipated for 3/31)   Recurrent acute sigmoid diverticulitis with intra-abdominal abscess:  -Prolonged post-op ileus. 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   Hypertension: Continue lisinopril and lasix  History of Chronic diastolic CHF (congestive heart failure): fluid overloaded after starting TPN,  -had been on IVF BNP 3342 on 3/19 but improved to 821 on 3/24)  -EF 55-60% based on 2D Echo on 02/25/2009, repeat ECHO demonstrated EF down to 40-45%, no wall motion abnormalities.  -cont lasix, now PO daily  -continue lisinopril   Right lower lobe pneumonia, likely aspiration,  -cont advancing diet as recommended by speech therapy  -breathing improving; WBC's WNL and patient afebrile  -mild SOB On exertion only  -Continue flutter valve and PO abx's   H/O: hypothyroidism  -Stable, last TSH 1/14 normal, the patient is not on any replacement  -will monitor as an outpatient   History of COPD with acute bronchitis  -continue alb and atrovent nebs PRN  -started on symbicort BID as maintenance treatment -continue abx's -breathing a lot better not wheezing anymore.   Deconditioning  -continue PT/OT  -SNF at discharge   Protein calorie malnutrition (moderate) due to acute illness/ Hypoalbuminemia:  -patient able to tolerate diet better.  -Off TNA on 3/30 in anticipation to discharge to SNF on Monday 3/31  -  continue ensure TID  -calorie intake count at SNF  Bigeminy and V-tach:  -no sustained and asymptomatic  -electrolytes repleted and WNL now.  -No further abnormalities  -Continue monitoring on  telemetry.   Procedures: -Sigmoid colectomy with hartman's pouch  Consultations:  Surgery  PT/OT  RD   Discharge Exam: Filed Vitals:   03/30/12 1000 03/30/12 1300 03/30/12 2052 03/31/12 0624  BP: 121/64 125/52 147/59 154/59  Pulse:  75 68 86  Temp:  98.2 F (36.8 C) 98.2 F (36.8 C) 97.3 F (36.3 C)  TempSrc:  Oral Oral Oral  Resp:  20 20 20   Height:      Weight:    67.314 kg (148 lb 6.4 oz)  SpO2:  96% 96% 91%   General: alert, awake and oriented X3, responding appropriately to all questions and at this point with good insight of her situation. Afebrile and following commands  CVS: S1-S2, regular rate; no rubs or gallops  Chest: no wheezing, no crackles and just mild scattered rhonchi; good air movement bilaterally  Abdomen: Wound intact and healing properly, no redness and no drainage; colostomy with positive output, belly was soft and no distended; positive BS  Extremities: no c/c/e   Discharge Instructions  Discharge Orders   Future Orders Complete By Expires     Diet - low sodium heart healthy  As directed     Discharge instructions  As directed     Comments:      Follow up with [facility] in 2 weeks Encourage PO intake and good hydration Take medications as prescribed BMET in 1 week to follow electrolytes and kidney function Follow a heart healthy diet        Medication List    STOP taking these medications       metroNIDAZOLE 500 MG tablet  Commonly known as:  FLAGYL      TAKE these medications       albuterol-ipratropium 18-103 MCG/ACT inhaler  Commonly known as:  COMBIVENT  Inhale 2 puffs into the lungs every 6 (six) hours as needed for wheezing. For shortness of breath     amoxicillin-clavulanate 500-125 MG per tablet  Commonly known as:  AUGMENTIN  Take 1 tablet (500 mg total) by mouth 3 (three) times daily.     aspirin 81 MG chewable tablet  Chew 81 mg by mouth daily.     budesonide-formoterol 160-4.5 MCG/ACT inhaler   Commonly known as:  SYMBICORT  Inhale 2 puffs into the lungs 2 (two) times daily.     feeding supplement Liqd  Take 237 mLs by mouth 3 (three) times daily between meals.     furosemide 20 MG tablet  Commonly known as:  LASIX  Take 2 tablets (40 mg total) by mouth daily.     HYDROcodone-acetaminophen 5-325 MG per tablet  Commonly known as:  NORCO  Take 1 tablet by mouth every 8 (eight) hours as needed for pain.     lisinopril 20 MG tablet  Commonly known as:  PRINIVIL,ZESTRIL  Take 1 tablet (20 mg total) by mouth daily.     polyethylene glycol packet  Commonly known as:  MIRALAX / GLYCOLAX  Take 17 g by mouth daily.     temazepam 7.5 MG capsule  Commonly known as:  RESTORIL  Take 1 capsule (7.5 mg total) by mouth at bedtime as needed for sleep.     Vitamin D (Ergocalciferol) 50000 UNITS Caps  Commonly known as:  DRISDOL  Take 50,000 Units by  mouth every 14 (fourteen) days.           Follow-up Information   Follow up with Lajean Saver, MD. Schedule an appointment as soon as possible for a visit in 2 weeks.   Contact information:   [address] [place] [address] [phone]       Follow up with Kaleen Mask, MD. Schedule an appointment as soon as possible for a visit in 3 weeks.   Contact information:   [address] [place] [address] [phone]        The results of significant diagnostics from this hospitalization (including imaging, microbiology, ancillary and laboratory) are listed below for reference.    Significant Diagnostic Studies: Dg Chest 2 View  03/21/2012  *RADIOLOGY REPORT*  Clinical Data: 80 year old female with shortness of breath. 03/19/2012 and earlier.  CHEST - 2 VIEW  Comparison:  03/19/2012 and earlier.  Findings: Semi upright AP and lateral views of the chest.  Stable right PICC line.  Small to moderate bilateral layering pleural effusions.  No pneumothorax or pulmonary edema. Stable cardiomegaly and  mediastinal contours.  There is streaky right infrahilar opacity, increased since 02/10/2012.  No other confluent pulmonary opacity.  Bulky chronic calcification of the thoracic inlet could be related to lymph nodes or thyroid nodules, not significantly changed since 2009. Stable visualized osseous structures.    IMPRESSION: 1.  Streaky right lower lobe opacities suspicious for pneumonia. 2.  Small to moderate layering bilateral pleural effusions. 3.  Stable right PICC line.   Original Report Authenticated By: Erskine Speed, M.D.    Dg Lumbar Spine 2-3 Views  03/21/2012  *RADIOLOGY REPORT*  Clinical Data: 79 year old female with back pain.  No injury.  LUMBAR SPINE - 2-3 VIEW  Comparison: CT abdomen pelvis 03/07/2012 and earlier.  Findings: Osteopenia.  Normal lumbar segmentation.  Stable vertebral height and alignment since 03/07/2012.  Mild grade 1 anterolisthesis of L4 on L5.  Multilevel moderate to severe lumbar facet hypertrophy.  Grossly stable visible lower thoracic levels. Midline abdominal skin staples. Calcified atherosclerosis of the abdominal aorta and iliac arteries.  Multiple gas-filled small bowel loops measuring up to 50 mm diameter.  IMPRESSION: 1.  Stable lumbar vertebral height and alignment since 03/07/2012. 2.  Gas filled dilated small bowel loops up to 50 mm diameter suggestive of small bowel obstruction.   Original Report Authenticated By: Erskine Speed, M.D.    Ct Head Wo Contrast  03/22/2012  *RADIOLOGY REPORT*  Clinical Data: 79 year old female with altered mental status and confusion.  CT HEAD WITHOUT CONTRAST  Technique:  Contiguous axial images were obtained from the base of the skull through the vertex without contrast.  Comparison: None  Findings: Chronic small vessel white matter ischemic changes are present. A remote right occipital/posterior parietal infarct is present. No acute intracranial abnormalities are identified, including mass lesion or mass effect, hydrocephalus,  extra-axial fluid collection, midline shift, hemorrhage, or acute infarction.  The visualized bony calvarium is unremarkable.  IMPRESSION: No evidence of acute intracranial abnormality.  Chronic small vessel white matter ischemic changes and remote right occipital/posterior parietal infarct.   Original Report Authenticated By: Harmon Pier, M.D.    Ct Abdomen Pelvis W Contrast  03/07/2012  *RADIOLOGY REPORT*  Clinical Data: Lower abdominal pain, history diverticulitis, CHF, COPD  CT ABDOMEN AND PELVIS WITH CONTRAST  Technique:  Multidetector CT imaging of the abdomen and pelvis was performed following the standard protocol during bolus administration of intravenous contrast. Sagittal and  coronal MPR images reconstructed from axial data set.  Contrast: 80mL OMNIPAQUE IOHEXOL 300 MG/ML  SOLN Dilute oral contrast.  Comparison: 02/07/2012  Findings: Minimal atelectasis posterior right lower lobe base. Improved atelectasis left lower lobe. Mitral annular calcification noted. Bilateral renal cysts. Liver, spleen, pancreas, with kidneys, and adrenal glands otherwise normal appearance.  Stomach incompletely distended, suboptimal assessment wall thickness. Small umbilical hernia containing fat. Diverticulosis of descending and sigmoid colon. Wall thickening identified at mid sigmoid colon with improved pericolic inflammatory changes compatible with acute diverticulitis. Extraluminal gas collections medial to the thickened sigmoid segment again seen compatible with contained perforation within the mesocolon. The presence of minimal high attenuation within this collection raises the possibility that this collection demonstrates continued communication with the adjacent sigmoid colon lumen.  No discrete drainable fluid/abscess collection identified. No free intraperitoneal air. Small bowel loops and remainder colon showed no additional acute changes. Scattered atherosclerotic calcifications. No mass, adenopathy, free to free of  fluid, or hernia. Bones diffusely demineralized.  IMPRESSION: Persistent acute diverticulitis changes of the sigmoid colon with wall thickening and further improved pericolic infiltrative changes. Persistent visualization of extraluminal gas collections within the sigmoid mesocolon at site of prior diverticular abscess, containing a small amount of central high attenuation which could represent GI contrast and indicate continued communication of this collection with the sigmoid lumen. Diverticulosis of sigmoid and descending colon.   Original Report Authenticated By: Ulyses Southward, M.D.    Dg Chest Port 1 View  03/23/2012  *RADIOLOGY REPORT*  Clinical Data: Shortness of breath.  PORTABLE CHEST - 1 VIEW  Comparison:   the previous day's study  Findings: Right arm PICC stable.  Coarse bilateral calcifications, presumably thyroid, at the thoracic inlet with possible tracheal narrowing. Heart size upper limits normal.  Lungs are clear.  No effusion.  IMPRESSION:  1.  Stable mild cardiomegaly. 2.  Suspect goiter. In the absence of any prior studies, consider elective outpatient thyroid ultrasound for further characterization.   Original Report Authenticated By: D. Andria Rhein, MD    Dg Chest Port 1 View  03/19/2012  *RADIOLOGY REPORT*  Clinical Data: .  PORTABLE CHEST - 1 VIEW  Comparison: PA and lateral chest 02/10/2012.  Findings: The patient has a new right PICC with the tip projecting at the superior cavoatrial junction.  There is new extensive right basilar airspace disease.  Milder degree of airspace opacity is seen in the left lung base.  Cardiomegaly is noted.  No pneumothorax.  IMPRESSION:  1.  Tip of right PICC projects over the superior cavoatrial junction. 2.  Right much worse than left basilar airspace disease could be due to asymmetric edema.  Pneumonia or aspiration in the right base could create a similar appearance.   Original Report Authenticated By: Holley Dexter, M.D.    Dg Abd Acute  W/chest  03/22/2012  *RADIOLOGY REPORT*  Clinical Data: Abdominal pain  ACUTE ABDOMEN SERIES (ABDOMEN 2 VIEW & CHEST 1 VIEW)  Comparison: In the 03/21/2012  Findings: Right arm PICC tip in the SVC, unchanged.  Right lower lobe airspace disease again noted and unchanged.  There is mild left lower lobe atelectasis which has increased slightly.  This could be due to atelectasis or pneumonia.  Mild vascular congestion without edema.  Calcified goiter.  Advanced degenerative change right shoulder  Dilated small bowel loops with air-fluid levels, with mild improvement since the prior study.  Colon is decompressed.  No free air on the decubitus view.  IMPRESSION: Bibasilar airspace disease, with  mild worsening on the left. Question atelectasis versus pneumonia.  Dilated small bowel with air-fluid levels, with mild improvement. This is most compatible with small bowel obstruction.   Original Report Authenticated By: Janeece Riggers, M.D.    Dg Abd Portable 1v  03/23/2012  *RADIOLOGY REPORT*  Clinical Data: Small bowel obstruction.  PORTABLE ABDOMEN - 1 VIEW  Comparison:   the previous day's study  Findings: Persistent dilated   small bowel loops in the mid and lower abdomen.  The colon appears decompressed.  The stomach is nondilated.  Midline skin staples.  Degenerative changes in the lower lumbar spine.  Visualized lung bases clear.  IMPRESSION:  1.  Little change in degree of small bowel dilatation.   Original Report Authenticated By: D. Andria Rhein, MD    Dg Swallowing [facility] Pathology  03/26/2012  [name], CCC-SLP     03/26/2012  2:28 PM Objective Swallowing Evaluation: Modified Barium Swallowing Study   Patient Details  Name: [name] MRN: [id number] Date of Birth: Dec 16, 1925  Today's Date: 03/26/2012 Time: 1310-1340 SLP Time Calculation (min): 30 min  Past Medical History:  Past Medical History  Diagnosis Date  . Hypertension   . Goiter     radioactive iodine ablation/notes 01/02/2012  . COPD (chronic  obstructive pulmonary disease)   . CHF (congestive heart failure)   . Pneumonia ?01/02/2012; ? 2009  . SOB (shortness of breath)     'all the time" (01/02/2012)  . Hypothyroidism     "I have taken Synthroid before" (01/02/2012)  . Arthritis     "right shoulder" (01/02/2012)  . Diverticulitis    Past Surgical History:  Past Surgical History  Procedure Laterality Date  . Cataract extraction w/ intraocular lens  implant, bilateral   ?1990's  . Appendectomy  1980's    "when they did hysterectomy" (01/02/2012)  . Abdominal hysterectomy  1980's  . Colostomy revision N/A 03/12/2012    Procedure: COLON RESECTION SIGMOID ;  Surgeon: Axel Filler,  MD;  Location: [facility] OR;  Service: General;  Laterality: N/A;  . Colostomy N/A 03/12/2012    Procedure: COLOSTOMY;  Surgeon: Axel Filler, MD;  Location:  [facility] OR;  Service: General;  Laterality: N/A;   HPI:  Pt currently tolerating nectar thick liquids. Objective study  recommended to determine appropriateness for advancing diet to  thin.     Assessment / Plan / Recommendation Clinical Impression  Dysphagia Diagnosis: Mild pharyngeal phase dysphagia Clinical impression: Mild pharyngeal dysphagia characterized  primarily by delayed swallow reflex, which resulted in vallecular  pooling of puree and pyriform pooling of thin liquid.  Flash  penetration was noted intermittently on thin liquids, but  penetrate was cleared spontaneously and was not aspirated.  Pt  tolerated solid consistency and barium tablet without difficulty.  Recommend advancing diet to dys 3/thin liquids when ok per  surgery.  In the interim, will advance liquids to thin and follow  for diet tolerance.    Treatment Recommendation  Therapy as outlined in treatment plan below    Diet Recommendation Thin liquid (Full liquid until surgery ok's  dys 3 solids)   Liquid Administration via: Cup;Straw Medication Administration: Whole meds with liquid Supervision: Intermittent supervision to cue for compensatory  strategies  Compensations: Slow rate;Small sips/bites Postural Changes and/or Swallow Maneuvers: Seated upright 90  degrees    Other  Recommendations Oral Care Recommendations: Oral care BID   Follow Up Recommendations  None    Frequency and Duration min 1 x/week  1  week   Pertinent Vitals/Pain Pt reports discomfort sitting upright    SLP Swallow Goals Patient will consume recommended diet without observed clinical  signs of aspiration with: Set-up Swallow Study Goal #1 - Progress: Progressing toward goal Patient will utilize recommended strategies during swallow to  increase swallowing safety with: Set-up Swallow Study Goal #2 - Progress: Progressing toward goal   General Date of Onset: 03/07/12 HPI: Pt currently tolerating nectar thick liquids. Objective  study recommended to determine appropriateness for advancing diet  to thin. Type of Study: Modified Barium Swallowing Study Reason for Referral: Objectively evaluate swallowing function Diet Prior to this Study: Nectar-thick liquids Temperature Spikes Noted: No Respiratory Status: Room air Behavior/Cognition: Alert;Cooperative;Pleasant mood Oral Cavity - Dentition: Dentures, bottom;Dentures, top Oral Motor / Sensory Function: Within functional limits Self-Feeding Abilities: Able to feed self Patient Positioning: Upright in bed Baseline Vocal Quality: Clear Volitional Cough: Weak Volitional Swallow: Able to elicit Anatomy: Within functional limits Pharyngeal Secretions: Not observed secondary MBS    Reason for Referral Objectively evaluate swallowing function   Oral Phase Oral Preparation/Oral Phase Oral Phase: WFL   Pharyngeal Phase Pharyngeal Phase Pharyngeal Phase: Impaired Pharyngeal - Thin Pharyngeal - Thin Straw: Delayed swallow initiation;Premature  spillage to pyriform sinuses;Premature spillage to  valleculae;Reduced airway/laryngeal  closure;Penetration/Aspiration during swallow Penetration/Aspiration details (thin straw): Material enters  airway, remains ABOVE  vocal cords then ejected out;Material does  not enter airway Pharyngeal - Solids Pharyngeal - Puree: Premature spillage to valleculae;Delayed  swallow initiation  Cervical Esophageal Phase    [name], CCC-SLP [phone]  Cervical Esophageal Phase Cervical Esophageal Phase: [facility]         Leigh Aurora 03/26/2012, 2:27 PM    Labs: Basic Metabolic Panel:  Recent Labs Lab 03/26/12 0511 03/27/12 0522 03/28/12 0433  NA  --  138  --   K  --  3.8  --   CL  --  102  --   CO2  --  27  --   GLUCOSE  --  138*  --   BUN  --  27*  --   CREATININE 0.67 0.64 0.59  CALCIUM  --  8.6  --   MG  --  2.6*  --   PHOS  --  3.5  --    Liver Function Tests:  Recent Labs Lab 03/27/12 0522  AST 25  ALT 29  ALKPHOS 64  BILITOT 0.3  PROT 6.2  ALBUMIN 2.4*    Recent Labs Lab 03/24/12 1050  AMMONIA 13   BNP: BNP (last 3 results)  Recent Labs  02/10/12 0850 03/19/12 1400 03/24/12 1608  PROBNP 5743.0* 3342.0* 821.4*   CBG:  Recent Labs Lab 03/30/12 0726 03/30/12 1140 03/30/12 1557 03/30/12 2043 03/31/12 0031  GLUCAP 125* 116* 116* 99 94    Signed:  Sharna Gabrys  [facility] 03/31/2012, 7:54 AM

## 2012-03-31 NOTE — Progress Notes (Signed)
Patient ID: [name], female   DOB: Jul 18, 1925, 79 y.o.   MRN: [id number] 19 Days Post-Op  Subjective: Pt feels well.  Eating better.  No pain.  Sitting up in chair  Objective: Vital signs in last 24 hours: Temp:  [97.3 F (36.3 C)-98.4 F (36.9 C)] 98.4 F (36.9 C) (03/31 1040) Pulse Rate:  [68-86] 80 (03/31 1040) Resp:  [20] 20 (03/31 1040) BP: (125-154)/(52-88) 130/88 mmHg (03/31 1040) SpO2:  [91 %-96 %] 92 % (03/31 1040) FiO2 (%):  [95 %] 95 % (03/30 2112) Weight:  [148 lb 6.4 oz (67.314 kg)] 148 lb 6.4 oz (67.314 kg) (03/31 0624) Last BM Date: 03/30/12  Intake/Output from previous day: 03/30 0701 - 03/31 0700 In: 1746.8 [P.O.:1040; I.V.:228; TPN:478.8] Out: 1575 [Urine:1400; Stool:175] Intake/Output this shift: Total I/O In: 50 [P.O.:50] Out: 100 [Urine:100]  PE: Abd: soft, Nt, ND, incision healing well.  Ostomy just changed  Lab Results:  No results found for this basename: WBC, HGB, HCT, PLT,  in the last 72 hours BMET No results found for this basename: NA, K, CL, CO2, GLUCOSE, BUN, CREATININE, CALCIUM,  in the last 72 hours PT/INR No results found for this basename: LABPROT, INR,  in the last 72 hours CMP     Component Value Date/Time   NA 138 03/27/2012 0522   K 3.8 03/27/2012 0522   CL 102 03/27/2012 0522   CO2 27 03/27/2012 0522   GLUCOSE 138* 03/27/2012 0522   BUN 27* 03/27/2012 0522   CREATININE 0.59 03/28/2012 0433   CREATININE 0.70 01/14/2012 1330   CALCIUM 8.6 03/27/2012 0522   PROT 6.2 03/27/2012 0522   ALBUMIN 2.4* 03/27/2012 0522   AST 25 03/27/2012 0522   ALT 29 03/27/2012 0522   ALKPHOS 64 03/27/2012 0522   BILITOT 0.3 03/27/2012 0522   GFRNONAA 82* 03/28/2012 0433   GFRAA >90 03/28/2012 0433   Lipase  No results found for this basename: lipase       Studies/Results: No results found.  Anti-infectives: Anti-infectives   Start     Dose/Rate Route Frequency Ordered Stop   03/31/12 0000  amoxicillin-clavulanate (AUGMENTIN) 500-125 MG per  tablet     1 tablet Oral 3 times daily 03/31/12 0751 04/01/12 2359   03/28/12 0000  amoxicillin-clavulanate (AUGMENTIN) 500-125 MG per tablet  Status:  Discontinued     1 tablet Oral 3 times daily 03/28/12 1539 03/31/12    03/27/12 1100  amoxicillin-clavulanate (AUGMENTIN) 500-125 MG per tablet 500 mg     1 tablet Oral 3 times daily 03/27/12 0951 03/30/12 2200   03/20/12 0900  piperacillin-tazobactam (ZOSYN) IVPB 3.375 g  Status:  Discontinued     3.375 g 12.5 mL/hr over 240 Minutes Intravenous 3 times per day 03/20/12 0839 03/27/12 0951   03/08/12 1900  ertapenem (INVANZ) 1 g in sodium chloride 0.9 % 50 mL IVPB  Status:  Discontinued     1 g 100 mL/hr over 30 Minutes Intravenous Every 24 hours 03/08/12 1408 03/19/12 1106   03/07/12 1800  ertapenem (INVANZ) 1 g in sodium chloride 0.9 % 50 mL IVPB     1 g 100 mL/hr over 30 Minutes Intravenous To Emergency Dept 03/07/12 1721 03/07/12 1955   03/07/12 1730  ertapenem (INVANZ) 1 g in sodium chloride 0.9 % 50 mL IVPB  Status:  Discontinued     1 g 100 mL/hr over 30 Minutes Intravenous  Once 03/07/12 1716 03/07/12 1717       Assessment/Plan  1.  S/p Hartman's for diverticulitis  Plan: 1. DC to [facility] today.  Follow up appt made with Derrell Lolling on 04-15-12.   LOS: 24 days    Naythan Douthit E 03/31/2012, 11:40 AM Pager: [phone]

## 2012-03-31 NOTE — Plan of Care (Signed)
Problem: Discharge Progression Outcomes Goal: Steri-Strips applied Outcome: Completed/Met Date Met:  03/31/12 Steri-strips intact

## 2012-03-31 NOTE — Consult Note (Signed)
WOC ostomy follow-up consult  Stoma type/location: Colostomy to left lower quad Stomal assessment/size: 1 1/4 inches, red and viable, flush with skin level Peristomal assessment: Macerated skin to 2cm surrounding stoma Output 100cc semi formed brown stool Ostomy pouching: 1pc with barrier ring Education provided: assisted daughter and pt with pouch change.   Pt was able to apply barrier ring and one piece pouch with assistance.  She is able to open and close velco to empty. Plans to transfer to a SNF today where she will have assistance emptying and changing pouch.  Reviewed ordering supplies with daughter.  Placed on [facility] discharge program.  Supplies at bedside for staff use.  Denies further questions at this time. Cammie Mcgee MSN, RN, CWOCN, [place], CNS [phone]

## 2012-03-31 NOTE — Progress Notes (Signed)
Calorie Count Note  48 hour calorie count ordered.  Diet: Low Sodium Heart Healthy Supplements: Ensure Complete TID   3/29 Breakfast: 115 kcal, 3 gm protein  Lunch: not recorded Dinner: not recorded  Supplements: not recorded   3/30 Breakfast: not recorded  Lunch: 455 kcal, 13 gm protein  Dinner: not recorded  Supplements: not recorded   3/30 Breakfast: 200 kcal, 0 gm protein  Lunch: 280 kcal, 10 gm protein  Dinner: not recorded  Supplements: not recorded    Meals have not been well documented, no supplement intake was documented for RD to assess. Meals that were recorded were minimally completed. Expect that total daily intake is inadequate, but at this time unable to determine.   Per notes, pt is planned to d/c to SNF facility. Pt will be followed by RD at SNF where intake can be determined.   Nutrition Dx: inadequate oral intake related to poor appetite as evidenced by calorie count results   Goal: Meet >/=90% estimated nutrition needs. Unmet   Intervention: Continue Ensure Complete TID.   Clarene Duke RD, LDN Pager [phone] After Hours pager [phone]

## 2012-03-31 NOTE — Progress Notes (Signed)
Physical Therapy Treatment Patient Details Name: [name] MRN: [id number] DOB: 05/17/1925 Today's Date: 03/31/2012 Time: 1345-1400 PT Time Calculation (min): 15 min  PT Assessment / Plan / Recommendation Comments on Treatment Session  79 y.o. female admitted to [facility] with abdominal pain found to have an abcess now s/p end colostomy.  She is progressing nicely with gait, but still needs cues for safety and to slow down.  She gets very DOE and needs encouragement to take her time, rest and perform pursed lip breathing.  She is due to s/c to [facility] for rehab today.     Follow Up Recommendations  SNF     Does the patient have the potential to tolerate intense rehabilitation    NA  Barriers to Discharge   none      Equipment Recommendations  None recommended by PT    Recommendations for Other Services   none  Frequency Min 3X/week   Plan Discharge plan remains appropriate;Frequency remains appropriate    Precautions / Restrictions Precautions Precautions: Fall Precaution Comments: ostomy bag L LQ   Pertinent Vitals/Pain No reports of pain.  3/4 DOE with gait.      Mobility  Bed Mobility Bed Mobility: Supine to Sit;Sitting - Scoot to Edge of Bed Supine to Sit: 5: Supervision;With rails;HOB elevated Sitting - Scoot to Edge of Bed: 5: Supervision;With rail Details for Bed Mobility Assistance: pt struggling to get EOB, relying heavily on bed rail to get to sitting.   Transfers Transfers: Sit to Stand;Stand to Sit Sit to Stand: 4: Min guard;With upper extremity assist;From bed;From chair/3-in-1;With armrests Stand to Sit: 4: Min guard;With upper extremity assist;To chair/3-in-1;With armrests Details for Transfer Assistance: min guard assist to steady pt for balance.  Ambulation/Gait Ambulation/Gait Assistance: 4: Min guard Ambulation Distance (Feet): 120 Feet Assistive device: Rolling walker Ambulation/Gait Assistance Details: verbal cues for upright posture, to stay closer  to RW and for correct hand placement.   Gait Pattern: Step-through pattern;Shuffle;Trunk flexed Gait velocity: pt walking uickly, but needed encouragement to slow down and breathe due to walking too fast to get done, but unsafe     PT Goals Acute Rehab PT Goals PT Goal: Supine/Side to Sit - Progress: Progressing toward goal PT Goal: Sit to Stand - Progress: Progressing toward goal PT Goal: Ambulate - Progress: Progressing toward goal  Visit Information  Last PT Received On: 03/31/12 Assistance Needed: +1    Subjective Data  Subjective: Pt reports she is dressed and ready to go to [facility] for rehab today.  Waiting on the ambulance.     Cognition  Cognition Overall Cognitive Status: Appears within functional limits for tasks assessed/performed Arousal/Alertness: Awake/alert Orientation Level: Appears intact for tasks assessed Behavior During Session: [facility] for tasks performed Cognition - Other Comments: not specifically tested       End of Session PT - End of Session Activity Tolerance: Patient limited by fatigue Patient left: in chair;with call bell/phone within reach       [place] B. Yuleni Burich, PT, DPT [phone]   03/31/2012, 3:00 PM

## 2012-04-15 ENCOUNTER — Encounter (INDEPENDENT_AMBULATORY_CARE_PROVIDER_SITE_OTHER): Payer: Self-pay | Admitting: General Surgery

## 2012-04-15 ENCOUNTER — Ambulatory Visit (INDEPENDENT_AMBULATORY_CARE_PROVIDER_SITE_OTHER): Payer: Medicare Other | Admitting: General Surgery

## 2012-04-15 VITALS — BP 140/82 | HR 82 | Temp 97.5°F | Resp 18 | Ht 60.0 in | Wt 141.0 lb

## 2012-04-15 DIAGNOSIS — Z9049 Acquired absence of other specified parts of digestive tract: Secondary | ICD-10-CM

## 2012-04-15 DIAGNOSIS — Z9889 Other specified postprocedural states: Secondary | ICD-10-CM

## 2012-04-15 NOTE — Progress Notes (Signed)
Patient ID: [name], female   DOB: 1925-08-11, 79 y.o.   MRN: [id number] The patient is a 79 year old female who underwent a Hartman's procedure.  The patient was discharged to a nursing home and has been working her strength. She is currently being released from the nursing home today. The patient also has been working well. Her previous abdominal pain for diverticulitis has been resolved.  On exam. Her midline wound is  well healed. Her ostomy is pink and patent  Assessment and plan.  1. We'll have patient follow back up in 6 weeks. 2 should the patient continue having issues with her ostomy. The patient may need possible nurse come to her home.

## 2012-05-13 ENCOUNTER — Telehealth (INDEPENDENT_AMBULATORY_CARE_PROVIDER_SITE_OTHER): Payer: Self-pay | Admitting: *Deleted

## 2012-05-13 NOTE — Telephone Encounter (Signed)
Patient called to state she noticed a small amount of blood on her sheet.  Patient is unsure if the blood came out of the stoma or she has a skin irritation.  Patient states she doesn't want to change the bag until tomorrow when she is suppose too.  Explained to patient she needs to look for where the blood is coming from either the skin with breakdown or out of the stoma.  Patient instructed if it is the skin then to use the skin barrier cream to help protect the skin.  Patient will call back tomorrow to let us know where the blood is coming from.

## 2012-05-22 ENCOUNTER — Encounter (INDEPENDENT_AMBULATORY_CARE_PROVIDER_SITE_OTHER): Payer: Self-pay | Admitting: General Surgery

## 2012-05-22 ENCOUNTER — Telehealth: Payer: Self-pay | Admitting: Internal Medicine

## 2012-05-22 ENCOUNTER — Ambulatory Visit (INDEPENDENT_AMBULATORY_CARE_PROVIDER_SITE_OTHER): Payer: Medicare Other | Admitting: General Surgery

## 2012-05-22 ENCOUNTER — Telehealth (INDEPENDENT_AMBULATORY_CARE_PROVIDER_SITE_OTHER): Payer: Self-pay | Admitting: General Surgery

## 2012-05-22 VITALS — BP 148/70 | HR 67 | Temp 97.6°F | Resp 18 | Ht 61.0 in | Wt 141.2 lb

## 2012-05-22 DIAGNOSIS — Z9049 Acquired absence of other specified parts of digestive tract: Secondary | ICD-10-CM

## 2012-05-22 DIAGNOSIS — Z9889 Other specified postprocedural states: Secondary | ICD-10-CM

## 2012-05-22 NOTE — Telephone Encounter (Signed)
Pt has no gi history. Has had problems with diverticulitis and is post partial colectomy surgery. Requests pt be seen and scheduled for colon. Pt scheduled to see Dr. Arlyce Dice 05/27/12@8 :30am. [facility] to notify pt of appt date and time.

## 2012-05-22 NOTE — Addendum Note (Signed)
Addended by: Joanette Gula on: 05/22/2012 11:51 AM   Modules accepted: Orders

## 2012-05-22 NOTE — Telephone Encounter (Signed)
Spoke with daughter Lenn Sink she is aware of appt with D kaplin 05/27/12 at 830am address and telephone was given to  [facility]

## 2012-05-22 NOTE — Progress Notes (Signed)
Patient ID: [name], female   DOB: February 14, 1925, 79 y.o.   MRN: [id number] The patient is a 79 year old female status post Hartman's procedure for diverticulitis. The patient has been doing well after leaving the nursing home. The patient states her appetite is getting better, her ostomy is working well.  On exam: Her wound is well healed Ostomy Is patent  Assessment and plan: 1. The patient referred to GI for a screening colonoscopy, the patient has not had one in the past and has had an episode of diverticulitis. 2. We'll have the patient scheduled for a barium enema to evaluate her rectal stump 3. The patient will follow back up in 6 weeks to review the studies and set up her stomy takedown.

## 2012-05-27 ENCOUNTER — Ambulatory Visit (INDEPENDENT_AMBULATORY_CARE_PROVIDER_SITE_OTHER): Payer: Medicare Other | Admitting: Gastroenterology

## 2012-05-27 ENCOUNTER — Encounter: Payer: Self-pay | Admitting: Gastroenterology

## 2012-05-27 VITALS — BP 118/60 | HR 58 | Ht 61.0 in | Wt 141.4 lb

## 2012-05-27 DIAGNOSIS — K5720 Diverticulitis of large intestine with perforation and abscess without bleeding: Secondary | ICD-10-CM

## 2012-05-27 DIAGNOSIS — K5732 Diverticulitis of large intestine without perforation or abscess without bleeding: Secondary | ICD-10-CM

## 2012-05-27 NOTE — Progress Notes (Signed)
History of Present Illness:  Pleasant 79 year old Afro-American female referred at the request of Dr. Derrell Lolling for screening colonoscopy.  In March, 2014 she underwent a sigmoid resection with Hartmann pouch and end colostomy for acute diverticulitis.  She had a prolonged postoperative ileus.  Since discharge she has done well. She's had no further episodes of pain. She denies bleeding. Prior to her surgery she had been complaining of frequent lower abdominal pain. She tends to be constipated.    Past Medical History  Diagnosis Date  . Hypertension   . Goiter     radioactive iodine ablation/notes 01/02/2012  . COPD (chronic obstructive pulmonary disease)   . CHF (congestive heart failure)   . Pneumonia ?01/02/2012; ? 2009  . SOB (shortness of breath)     'all the time" (01/02/2012)  . Hypothyroidism     "I have taken Synthroid before" (01/02/2012)  . Arthritis     "right shoulder" (01/02/2012)  . Diverticulitis    Past Surgical History  Procedure Laterality Date  . Cataract extraction w/ intraocular lens  implant, bilateral  ?1990's  . Appendectomy  1980's    "when they did hysterectomy" (01/02/2012)  . Abdominal hysterectomy  1980's  . Colostomy revision N/A 03/12/2012    Procedure: COLON RESECTION SIGMOID ;  Surgeon: Axel Filler, MD;  Location: [facility] OR;  Service: General;  Laterality: N/A;  . Colostomy N/A 03/12/2012    Procedure: COLOSTOMY;  Surgeon: Axel Filler, MD;  Location: [facility] OR;  Service: General;  Laterality: N/A;   family history includes Cancer in her sister and Heart disease in an unspecified family member. Current Outpatient Prescriptions  Medication Sig Dispense Refill  . albuterol-ipratropium (COMBIVENT) 18-103 MCG/ACT inhaler Inhale 2 puffs into the lungs every 6 (six) hours as needed for wheezing. For shortness of breath      . aspirin 81 MG chewable tablet Chew 81 mg by mouth daily.      . budesonide-formoterol (SYMBICORT) 160-4.5 MCG/ACT inhaler Inhale 2 puffs into the  lungs 2 (two) times daily.  1 Inhaler  12  . furosemide (LASIX) 20 MG tablet Take 2 tablets (40 mg total) by mouth daily.  30 tablet    . ipratropium (ATROVENT) 0.02 % nebulizer solution Take 500 mcg by nebulization 4 (four) times daily.      Marland Kitchen lisinopril (PRINIVIL,ZESTRIL) 20 MG tablet Take 1 tablet (20 mg total) by mouth daily.      . potassium chloride (K-DUR) 10 MEQ tablet Take 1 tablet (10 mEq total) by mouth daily.      . Vitamin D, Ergocalciferol, (DRISDOL) 50000 UNITS CAPS Take 50,000 Units by mouth every 14 (fourteen) days.       No current facility-administered medications for this visit.   Allergies as of 05/27/2012 - Review Complete 05/27/2012  Allergen Reaction Noted  . Indomethacin Anaphylaxis and Other (See Comments) 01/29/2011    reports that she quit smoking about 21 years ago. Her smoking use included Cigarettes. She has a 4.2 pack-year smoking history. She has never used smokeless tobacco. She reports that she does not drink alcohol or use illicit drugs.     Review of Systems: She has baseline dyspnea on exertion. Pertinent positive and negative review of systems were noted in the above HPI section. All other review of systems were otherwise negative.  Vital signs were reviewed in today's medical record Physical Exam: General: Well developed , well nourished, no acute distress Skin: anicteric Head: Normocephalic and atraumatic Eyes:  sclerae anicteric, EOMI Ears: Normal  auditory acuity Mouth: No deformity or lesions Neck: Supple, no masses or thyromegaly Lungs: Clear throughout to auscultation Heart: Regular rate and rhythm; no murmurs, rubs or bruits Abdomen: Soft, non tender and non distended. No masses, hepatosplenomegaly or hernias noted. Normal Bowel sounds.  A colostomy is in place and appears normal. Rectal:deferred Musculoskeletal: Symmetrical with no gross deformities  Skin: No lesions on visible extremities Pulses:  Normal pulses noted Extremities: No  clubbing, cyanosis, edema or deformities noted Neurological: Alert oriented x 4, grossly nonfocal Cervical Nodes:  No significant cervical adenopathy Inguinal Nodes: No significant inguinal adenopathy Psychological:  Alert and cooperative. Normal mood and affect

## 2012-05-27 NOTE — Patient Instructions (Addendum)
You are already scheduled for your Colonoscopy Please use a Fleet Enema before your procedure as well

## 2012-05-27 NOTE — Assessment & Plan Note (Signed)
Patient has had an unremarkable postoperative recovery. Plan colonoscopy prior to takedown of ostomy. Both the proximal colon and the Hartman's pouch will be examined.

## 2012-05-29 ENCOUNTER — Ambulatory Visit (HOSPITAL_COMMUNITY)
Admission: RE | Admit: 2012-05-29 | Discharge: 2012-05-29 | Disposition: A | Discharge status: Home / Self Care | Payer: Medicare Other | Source: Ambulatory Visit | Attending: General Surgery | Admitting: General Surgery

## 2012-05-29 DIAGNOSIS — Z01818 Encounter for other preprocedural examination: Secondary | ICD-10-CM | POA: Insufficient documentation

## 2012-05-29 DIAGNOSIS — K5732 Diverticulitis of large intestine without perforation or abscess without bleeding: Secondary | ICD-10-CM | POA: Insufficient documentation

## 2012-05-29 DIAGNOSIS — Z9049 Acquired absence of other specified parts of digestive tract: Secondary | ICD-10-CM

## 2012-07-28 ENCOUNTER — Telehealth: Payer: Self-pay | Admitting: Gastroenterology

## 2012-07-28 ENCOUNTER — Encounter (INDEPENDENT_AMBULATORY_CARE_PROVIDER_SITE_OTHER): Payer: Self-pay | Admitting: General Surgery

## 2012-07-28 ENCOUNTER — Telehealth (INDEPENDENT_AMBULATORY_CARE_PROVIDER_SITE_OTHER): Payer: Self-pay | Admitting: General Surgery

## 2012-07-28 ENCOUNTER — Telehealth: Payer: Self-pay | Admitting: *Deleted

## 2012-07-28 ENCOUNTER — Ambulatory Visit (INDEPENDENT_AMBULATORY_CARE_PROVIDER_SITE_OTHER): Payer: Medicare Other | Admitting: General Surgery

## 2012-07-28 VITALS — BP 130/76 | HR 64 | Temp 98.0°F | Resp 15 | Ht 61.0 in | Wt 148.6 lb

## 2012-07-28 DIAGNOSIS — Z9889 Other specified postprocedural states: Secondary | ICD-10-CM

## 2012-07-28 DIAGNOSIS — Z9049 Acquired absence of other specified parts of digestive tract: Secondary | ICD-10-CM

## 2012-07-28 NOTE — Telephone Encounter (Signed)
Pt aware of appt dates and times. 

## 2012-07-28 NOTE — Telephone Encounter (Signed)
Pt scheduled for previsit tomorrow at 3:30pm. Colon scheduled with Dr. Arlyce Dice in the [facility] 07/31/12@10am . Left message for pt to call back regarding appt dates and times.

## 2012-07-28 NOTE — Telephone Encounter (Signed)
She has an ostomy and a Hartman's pouch.  She need both the oral prep and fleet's enema per rectum.

## 2012-07-28 NOTE — Progress Notes (Signed)
Subjective:     Patient ID: [name], female   DOB: [birth year], 79 y.o.   MRN: [id number]  HPI The patient is a 79 year old female with a previous history of a sigmoid resection secondary to diverticulitis. Patient most recently underwent a barium enema which in no irregular masses. Patient had seen Dr. Arlyce Dice for initial evaluation for a colonoscopy but subsequent to misunderstanding patient has not had a colonoscopy.  The patient otherwise states that her ostomy is functioning well and has been gaining weight.  Review of Systems  Constitutional: Negative.   HENT: Negative.   Respiratory: Negative.   Cardiovascular: Negative.   Gastrointestinal: Negative.   Neurological: Negative.   All other systems reviewed and are negative.       Objective:   Physical Exam  Constitutional: She is oriented to person, place, and time. She appears well-developed and well-nourished.  HENT:  Head: Normocephalic and atraumatic.  Eyes: Conjunctivae and EOM are normal. Pupils are equal, round, and reactive to light.  Neck: Normal range of motion. Neck supple.  Cardiovascular: Normal rate, regular rhythm and normal heart sounds.   Pulmonary/Chest: Effort normal and breath sounds normal.  Abdominal: Soft. Bowel sounds are normal.  Musculoskeletal: Normal range of motion.  Neurological: She is alert and oriented to person, place, and time.       Assessment:     A 79 year old female status post sigmoid resection and an end ostomy with Hartman's procedure.     Plan:     1. We will have the patient follow back up with Dr. Arlyce Dice for scheduling of her colonoscopy. 2. The patient also needs to follow up with her primary care physician to evaluate her medical condition she should decide to undergo Hartmann takedown and anastomosis. 3. Once her colonoscopy in her evaluation by her PCP are done she can follow back up for a rediscussion of her surgery. I discussed with her that is not something that  definitely has to be done and she needs to consider her medical issues as part of the decision to schedule surgery. I spoke with her in regards to the possibilities of worsening her medical condition and possibly death secondary to her medical condition should  She choose undergoing surgery. The patient understands this and will discuss thiswith her family prior to decision.

## 2012-07-28 NOTE — Telephone Encounter (Signed)
[name]/Dr.Kaplan's nurse called and made nurse appt for 7/29 @ 3:30 and colonoscopy for 7/31 @ 10:00.Marland KitchenMarland KitchenBonita Quin stated that she would call and make patient aware of everything...today's office note along with medical clearance letter for surgery faxed to Dr.Wilson Jeannetta Nap [phone] and confirmation received

## 2012-07-28 NOTE — Telephone Encounter (Signed)
Patient is for colonoscopy on Thursday 7/31. In the office visit note it states for patient to "use Fleet Enema before procedure as well". Do you want patient to drink the Suprep and do fleet enema per rectum also? Please clarify prep instructions for pre-visit. Thanks.

## 2012-07-29 ENCOUNTER — Ambulatory Visit (AMBULATORY_SURGERY_CENTER): Payer: Medicare Other

## 2012-07-29 VITALS — Ht 61.0 in | Wt 147.8 lb

## 2012-07-29 DIAGNOSIS — Z9049 Acquired absence of other specified parts of digestive tract: Secondary | ICD-10-CM

## 2012-07-29 DIAGNOSIS — Z9889 Other specified postprocedural states: Secondary | ICD-10-CM

## 2012-07-29 DIAGNOSIS — Z8719 Personal history of other diseases of the digestive system: Secondary | ICD-10-CM

## 2012-07-29 MED ORDER — NA SULFATE-K SULFATE-MG SULF 17.5-3.13-1.6 GM/177ML PO SOLN: 1.0000 | Freq: Once | ORAL | Status: DC

## 2012-07-30 ENCOUNTER — Encounter: Payer: Self-pay | Admitting: Gastroenterology

## 2012-07-30 NOTE — Telephone Encounter (Signed)
Called patient to let her know that per Dr.Wilson Jeannetta Nap he will need to see her in his office before he can give medical clearance for her surgery.Marland Kitchenspoke with patient 07/30/12 @ 10:30 and she was agreeable and stated she would call them and make an appt

## 2012-07-31 ENCOUNTER — Ambulatory Visit (AMBULATORY_SURGERY_CENTER): Payer: Medicare Other | Admitting: Gastroenterology

## 2012-07-31 ENCOUNTER — Encounter: Payer: Self-pay | Admitting: Gastroenterology

## 2012-07-31 VITALS — BP 132/60 | HR 49 | Temp 96.8°F | Resp 16 | Ht 61.0 in | Wt 147.0 lb

## 2012-07-31 DIAGNOSIS — D126 Benign neoplasm of colon, unspecified: Secondary | ICD-10-CM

## 2012-07-31 DIAGNOSIS — K5730 Diverticulosis of large intestine without perforation or abscess without bleeding: Secondary | ICD-10-CM

## 2012-07-31 DIAGNOSIS — Z8719 Personal history of other diseases of the digestive system: Secondary | ICD-10-CM

## 2012-07-31 MED ORDER — SODIUM CHLORIDE 0.9 % IV SOLN: 500.0000 mL | INTRAVENOUS | Status: DC

## 2012-07-31 NOTE — Progress Notes (Signed)
Patient did not experience any of the following events: a burn prior to discharge; a fall within the facility; wrong site/side/patient/procedure/implant event; or a hospital transfer or hospital admission upon discharge from the facility. (G8907) Patient did not have preoperative order for IV antibiotic SSI prophylaxis. (G8918)  

## 2012-07-31 NOTE — Patient Instructions (Signed)
YOU HAD AN ENDOSCOPIC PROCEDURE TODAY AT THE [facility]: Refer to the procedure report that was given to you for any specific questions about what was found during the examination.  If the procedure report does not answer your questions, please call your gastroenterologist to clarify.  If you requested that your care partner not be given the details of your procedure findings, then the procedure report has been included in a sealed envelope for you to review at your convenience later.  YOU SHOULD EXPECT: Some feelings of bloating in the abdomen. Passage of more gas than usual.  Walking can help get rid of the air that was put into your GI tract during the procedure and reduce the bloating. If you had a lower endoscopy (such as a colonoscopy or flexible sigmoidoscopy) you may notice spotting of blood in your stool or on the toilet paper. If you underwent a bowel prep for your procedure, then you may not have a normal bowel movement for a few days.  DIET: Your first meal following the procedure should be a light meal and then it is ok to progress to your normal diet.  A half-sandwich or bowl of soup is an example of a good first meal.  Heavy or fried foods are harder to digest and may make you feel nauseous or bloated.  Likewise meals heavy in dairy and vegetables can cause extra gas to form and this can also increase the bloating.  Drink plenty of fluids but you should avoid alcoholic beverages for 24 hours.  ACTIVITY: Your care partner should take you home directly after the procedure.  You should plan to take it easy, moving slowly for the rest of the day.  You can resume normal activity the day after the procedure however you should NOT DRIVE or use heavy machinery for 24 hours (because of the sedation medicines used during the test).    SYMPTOMS TO REPORT IMMEDIATELY: A gastroenterologist can be reached at any hour.  During normal business hours, 8:30 AM to 5:00 PM Monday through Friday,  call [phone].  After hours and on weekends, please call the GI answering service at [phone] who will take a message and have the physician on call contact you.   Following lower endoscopy (colonoscopy or flexible sigmoidoscopy):  Excessive amounts of blood in the stool  Significant tenderness or worsening of abdominal pains  Swelling of the abdomen that is new, acute  Fever of 100F or higher   FOLLOW UP: If any biopsies were taken you will be contacted by phone or by letter within the next 1-3 weeks.  Call your gastroenterologist if you have not heard about the biopsies in 3 weeks.  Our staff will call the home number listed on your records the next business day following your procedure to check on you and address any questions or concerns that you may have at that time regarding the information given to you following your procedure. This is a courtesy call and so if there is no answer at the home number and we have not heard from you through the emergency physician on call, we will assume that you have returned to your regular daily activities without incident.  SIGNATURES/CONFIDENTIALITY: You and/or your care partner have signed paperwork which will be entered into your electronic medical record.  These signatures attest to the fact that that the information above on your After Visit Summary has been reviewed and is understood.  Full responsibility of the confidentiality of   this discharge information lies with you and/or your care-partner.  INFORMATION ON POLYPS & DIVERTICULOSIS  GIVEN TO YOU TODAY  

## 2012-07-31 NOTE — Progress Notes (Signed)
Called to room to assist during endoscopic procedure.  Patient ID and intended procedure confirmed with present staff. Received instructions for my participation in the procedure from the performing physician.  

## 2012-07-31 NOTE — Progress Notes (Signed)
Procedure ends, to recovery, report given and VSS. 

## 2012-07-31 NOTE — Op Note (Signed)
[facility] [address] [place], [address]   COLONOSCOPY PROCEDURE REPORT  PATIENT: Jacqueline, [name]  MR#: [id number] BIRTHDATE: 1925/09/18 , 86  yrs. old GENDER: Female ENDOSCOPIST: Louis Meckel, MD REFERRED XB:[id number], Wilson PROCEDURE DATE:  07/31/2012 PROCEDURE:    Diagnostic colonoscopy via stoma, and Colonoscopy via stoma with snare polypectomy, cold bx polypectomy First Screening Colonoscopy - Avg.  risk and is 50 yrs.  old or older - No.  Prior Negative Screening - Now for repeat screening. N/A  History of Adenoma - Now for follow-up colonoscopy & has been > or = to 3 yrs.  N/A  Polyps Removed Today? Yes. ASA CLASS:   Class II INDICATIONS:history of acute diverticulitis with diverting colostomy.Marland Kitchen MEDICATIONS: MAC sedation, administered by CRNA and propofol (Diprivan) 200mg  IV  DESCRIPTION OF PROCEDURE:   After the risks benefits and alternatives of the procedure were thoroughly explained, informed consent was obtained.  A digital rectal exam revealed stenosis of the anal canal.  ostomy site was normal. The [id number] endoscope was introduced through the anus and advanced to the end of the Hartman's pouch.  Separately the ostomy was entered and advanced to the cecum which was identified by both the appendix and ileocecal valve. No adverse events experienced.   The quality of the prep was Suprep good  The instrument was then slowly withdrawn as the colon was fully examined.      COLON FINDINGS: A sessile polyp measuring 3 mm in size was found in the proximal transverse colon.  A polypectomy was performed with a cold snare.  The resection was complete and the polyp tissue was completely retrieved.   A sessile polyp measuring 2 mm in size was found in the distal transverse colon.  A polypectomy was performed with cold forceps.   Moderate diverticulosis was noted in the transverse colon.   The colon mucosa was otherwise normal. Retroflexed  views revealed no abnormalities. The time to cecum=1 minutes 52 seconds.  Withdrawal time=7 minutes 19 seconds.  The scope was withdrawn and the procedure completed. COMPLICATIONS: There were no complications.  ENDOSCOPIC IMPRESSION: 1.   Sessile polyp measuring 3 mm in size was found in the proximal transverse colon; polypectomy was performed with a cold snare 2.   Sessile polyp measuring 2 mm in size was found in the distal transverse colon; polypectomy was performed with cold forceps 3.   Moderate diverticulosis was noted in the transverse colon 4.   The colon mucosa was otherwise normal  RECOMMENDATIONS: Given your age, you will not need another colonoscopy for colon cancer screening or polyp surveillance.  These types of tests usually stop around the age 59.   eSigned:  Louis Meckel, MD 07/31/2012 10:21 AM   cc: Dr. Derrell Lolling ([facility]),   PATIENT NAME:  Jacqueline, [name] MR#: [id number]

## 2012-08-01 ENCOUNTER — Telehealth: Payer: Self-pay | Admitting: *Deleted

## 2012-08-01 NOTE — Telephone Encounter (Signed)
  Follow up Call-  Call back number 07/31/2012  Post procedure Call Back phone  # [phone]  Permission to leave phone message Yes     Patient questions:  Do you have a fever, pain , or abdominal swelling? no Pain Score  0 *  Have you tolerated food without any problems? yes  Have you been able to return to your normal activities? yes  Do you have any questions about your discharge instructions: Diet   no Medications  no Follow up visit  no  Do you have questions or concerns about your Care? no  Actions: * If pain score is 4 or above: No action needed, pain <4.

## 2012-08-12 ENCOUNTER — Encounter: Payer: Self-pay | Admitting: Gastroenterology

## 2012-09-04 ENCOUNTER — Encounter: Payer: Self-pay | Admitting: *Deleted

## 2012-09-04 ENCOUNTER — Encounter: Payer: Self-pay | Admitting: Cardiovascular Disease

## 2012-09-04 ENCOUNTER — Ambulatory Visit (INDEPENDENT_AMBULATORY_CARE_PROVIDER_SITE_OTHER): Payer: Medicare Other | Admitting: Cardiovascular Disease

## 2012-09-04 VITALS — BP 137/60 | HR 58 | Ht 60.0 in | Wt 151.1 lb

## 2012-09-04 DIAGNOSIS — I509 Heart failure, unspecified: Secondary | ICD-10-CM

## 2012-09-04 DIAGNOSIS — I5042 Chronic combined systolic (congestive) and diastolic (congestive) heart failure: Secondary | ICD-10-CM

## 2012-09-04 NOTE — Patient Instructions (Addendum)
Your physician wants you to follow-up in: 6 months  You will receive a reminder letter in the mail two months in advance. If you don't receive a letter, please call our office to schedule the follow-up appointment.  You are low to moderate risk for colostomy reversal.  See letter

## 2012-09-04 NOTE — Assessment & Plan Note (Signed)
[name] presents today for preoperative assessment prior to having reversal of colostomy. She has been seen in this office many years ago. She's not had any recent cardiac complications. CBC had a perforated diverticulum and had emergent partial colectomy with the placement of a colostomy bag. She's done well since that time but now scheduled have reversal of her colostomy.  She's able to do all of her housework without any significant problems. She's able to climb one flight of stairs without apical disease. In my opinion she is at low to intermediate risk for any cardiovascular complications during her colostomy reversal. She had a much more complicated surgery earlier this spring and did not have cardiac complications at that time. I doubt that she's going to have cardiac palpitations with this elective colostomy reversal.  We will continue with her same medications. I'll see her again in 6 months.

## 2012-09-04 NOTE — Progress Notes (Signed)
[name] Date of Birth  12-25-1925       [facility]    [place] [address]  [address], [address] [place], [place]  [address]   [place], [place]  [address] [phone]     [phone]   Fax  [phone]    Fax [phone]  Problem List: 1. HTN 2. CHF 3. Hypothyroidism 4. diverticulitis  History of Present Illness:  [name] is an 79 yo with hx diverticulitis with diverticulosis in April. She had a perforated diverticulum and became septic. She had emergent surgery with a colostomy placement. She now scheduled to have reversal of her colostomy and the surgeons are requesting cardiac clearance. When she was hospitalized she had an echocardiogram that revealed mildly depressed left ventricular systolic function with an ejection fraction of 40-45%. She did have some diastolic dysfunction.  Left ventricle: The cavity size was moderately dilated. Wall thickness was normal. Systolic function was mildly to moderately reduced. The estimated ejection fraction was in the range of 40% to 45%. There was an increased relative contribution of atrial contraction to ventricular filling. - Left atrium: The atrium was mildly dilated  He has not had any cardiac problems since I last saw her 7 years ago.   She does all of her housework without any significant problems. She's able to calm 1 flight of stairs without difficulty. She thinks that she would become short of breath if I asked her to climb 2 flights of stairs.    Current Outpatient Prescriptions on File Prior to Visit  Medication Sig Dispense Refill  . albuterol-ipratropium (COMBIVENT) 18-103 MCG/ACT inhaler Inhale 2 puffs into the lungs every 6 (six) hours as needed for wheezing. For shortness of breath      . aspirin 81 MG chewable tablet Chew 81 mg by mouth daily.      . budesonide-formoterol (SYMBICORT) 160-4.5 MCG/ACT inhaler Inhale 2 puffs into the lungs 2 (two) times daily.  1 Inhaler   12  . ipratropium (ATROVENT) 0.02 % nebulizer solution Take 500 mcg by nebulization 4 (four) times daily as needed.       . Vitamin D, Ergocalciferol, (DRISDOL) 50000 UNITS CAPS Take 50,000 Units by mouth every 14 (fourteen) days.       No current facility-administered medications on file prior to visit.    Allergies  Allergen Reactions  . Indomethacin Anaphylaxis and Other (See Comments)    "took it fine for awhile; one day I stopped breathing and I ended up in the hospital" (01/02/2012)    Past Medical History  Diagnosis Date  . Hypertension   . Goiter     radioactive iodine ablation/notes 01/02/2012  . COPD (chronic obstructive pulmonary disease)   . CHF (congestive heart failure)   . Pneumonia ?01/02/2012; ? 2009  . SOB (shortness of breath)     'all the time" (01/02/2012)  . Hypothyroidism     "I have taken Synthroid before" (01/02/2012)  . Arthritis     "right shoulder" (01/02/2012)  . Diverticulitis   . H/O colostomy     Past Surgical History  Procedure Laterality Date  . Cataract extraction w/ intraocular lens  implant, bilateral  ?1990's    Bil  . Appendectomy  1980's    "when they did hysterectomy" (01/02/2012)  . Abdominal hysterectomy  1980's  . Colostomy revision N/A 03/12/2012    Procedure: COLON RESECTION SIGMOID ;  Surgeon: Axel Filler, MD;  Location: [facility] OR;  Service: General;  Laterality: N/A;  . Colostomy N/A 03/12/2012    Procedure: COLOSTOMY;  Surgeon: Axel Filler, MD;  Location: [facility] OR;  Service: General;  Laterality: N/A;    History  Smoking status  . Former Smoker -- 0.12 packs/day for 35 years  . Types: Cigarettes  . Quit date: 01/02/1991  Smokeless tobacco  . Never Used    History  Alcohol Use No    Family History  Problem Relation Age of Onset  . Heart disease    . Cancer Sister     Breast    Reviw of Systems:  Reviewed in the HPI.  All other systems are negative.  Physical Exam: Blood pressure 137/60, pulse 58, height 5' (1.524 m),  weight 151 lb 1.9 oz (68.548 kg). General: Well developed, well nourished, in no acute distress.  Head: Normocephalic, atraumatic, sclera non-icteric, mucus membranes are moist,   Neck: Supple. Carotids are 2 + without bruits. No JVD   Lungs: Clear   Heart: RR, soft murmur  Abdomen: Soft, non-tender, non-distended with normal bowel sounds. + colostomy bag present  Msk:  Strength and tone are normal   Extremities: No clubbing or cyanosis. No edema.  Distal pedal pulses are 2+ and equal    Neuro: CN II - XII intact.  Alert and oriented X 3.   Psych:  Normal   ECG: Sept. 4, 2014:  Sinus brady.  ST /T abnormality.  No significant changes from previous   Assessment / Plan:

## 2012-09-08 ENCOUNTER — Telehealth (INDEPENDENT_AMBULATORY_CARE_PROVIDER_SITE_OTHER): Payer: Self-pay | Admitting: General Surgery

## 2012-09-08 NOTE — Telephone Encounter (Signed)
Spoke with patient and let her know that we needed something in writing faxed over from Dr.Elkins office stating that she cleared from a medical standpoint for her surgery...we already have note in system from Dr.Nahser and Dr.Kaplan from colonoscopy report...patient stated that she would call dr.Elkins to see if they would get something faxed over to [name]'s attn

## 2012-09-08 NOTE — Telephone Encounter (Signed)
Message copied by June Leap on Mon Sep 08, 2012  2:49 PM ------      Message from: [facility]      Created: Mon Sep 08, 2012 11:52 AM      Contact: [phone]       PLEASE CALL HER SHE HAD ALL OF THE TEST DONE HE ASKED FOR SO SHE NEEDS TO NO OF SHE CAN GO AHEAD WITH SURGERY OR COME BACK IN THE OFFICE FOR A VISIT ------

## 2012-09-18 ENCOUNTER — Telehealth (INDEPENDENT_AMBULATORY_CARE_PROVIDER_SITE_OTHER): Payer: Self-pay | Admitting: General Surgery

## 2012-09-18 ENCOUNTER — Other Ambulatory Visit (INDEPENDENT_AMBULATORY_CARE_PROVIDER_SITE_OTHER): Payer: Self-pay | Admitting: General Surgery

## 2012-09-18 NOTE — Telephone Encounter (Signed)
LMOM at 12:35 letting patient know that  CC has been received and to stop her ASA 7 days prior to her surgery date...called and spoke with Jasmine December her daughter and gave her the info as well...she verbalized agreement with POC at this time

## 2012-09-22 ENCOUNTER — Encounter (INDEPENDENT_AMBULATORY_CARE_PROVIDER_SITE_OTHER): Payer: Self-pay

## 2012-10-13 ENCOUNTER — Encounter (HOSPITAL_COMMUNITY): Payer: Self-pay | Admitting: Pharmacy Technician

## 2012-10-16 ENCOUNTER — Other Ambulatory Visit (INDEPENDENT_AMBULATORY_CARE_PROVIDER_SITE_OTHER): Payer: Self-pay | Admitting: *Deleted

## 2012-10-16 ENCOUNTER — Encounter (HOSPITAL_COMMUNITY)
Admission: RE | Admit: 2012-10-16 | Discharge: 2012-10-16 | Disposition: A | Discharge status: Home / Self Care | Payer: Medicare Other | Source: Ambulatory Visit | Attending: General Surgery | Admitting: General Surgery

## 2012-10-16 ENCOUNTER — Telehealth (INDEPENDENT_AMBULATORY_CARE_PROVIDER_SITE_OTHER): Payer: Self-pay | Admitting: *Deleted

## 2012-10-16 ENCOUNTER — Telehealth (INDEPENDENT_AMBULATORY_CARE_PROVIDER_SITE_OTHER): Payer: Self-pay

## 2012-10-16 ENCOUNTER — Encounter (HOSPITAL_COMMUNITY): Payer: Self-pay

## 2012-10-16 ENCOUNTER — Telehealth (INDEPENDENT_AMBULATORY_CARE_PROVIDER_SITE_OTHER): Payer: Self-pay | Admitting: General Surgery

## 2012-10-16 ENCOUNTER — Other Ambulatory Visit (INDEPENDENT_AMBULATORY_CARE_PROVIDER_SITE_OTHER): Payer: Self-pay | Admitting: General Surgery

## 2012-10-16 DIAGNOSIS — E875 Hyperkalemia: Secondary | ICD-10-CM

## 2012-10-16 DIAGNOSIS — Z01818 Encounter for other preprocedural examination: Secondary | ICD-10-CM | POA: Insufficient documentation

## 2012-10-16 DIAGNOSIS — Z01812 Encounter for preprocedural laboratory examination: Secondary | ICD-10-CM | POA: Insufficient documentation

## 2012-10-16 HISTORY — DX: Gastro-esophageal reflux disease without esophagitis: K21.9

## 2012-10-16 HISTORY — DX: Anemia, unspecified: D64.9

## 2012-10-16 LAB — BASIC METABOLIC PANEL
BUN: 21 mg/dL (ref 6–23)
CO2: 27 mEq/L (ref 19–32)
Chloride: 106 mEq/L (ref 96–112)
Creatinine, Ser: 0.77 mg/dL (ref 0.50–1.10)
GFR calc Af Amer: 86 mL/min — ABNORMAL LOW (ref >90)
GFR calc non Af Amer: 74 mL/min — ABNORMAL LOW (ref >90)
Potassium: 6.8 mEq/L (ref 3.5–5.1)
Sodium: 138 mEq/L (ref 135–145)

## 2012-10-16 LAB — TYPE AND SCREEN
ABO/RH(D): A POS
Antibody Screen: NEGATIVE

## 2012-10-16 LAB — ABO/RH: ABO/RH(D): A POS

## 2012-10-16 LAB — CBC
HCT: 36.3 % (ref 36.0–46.0)
MCHC: 33.3 g/dL (ref 30.0–36.0)
MCV: 90.5 fL (ref 78.0–100.0)
RBC: 4.01 MIL/uL (ref 3.87–5.11)
RDW: 12 % (ref 11.5–15.5)
WBC: 5.9 10*3/uL (ref 4.0–10.5)

## 2012-10-16 NOTE — Telephone Encounter (Signed)
Called and spoke with pt who stated that pre op told her to perform fleets enema the a.m of surgery and the pt stated that she had to perform one of these the a.m before her colonoscopy and it burned her so bad and also brought blood...is there anything else she can do ...she does NOT want to perform another fleets enema

## 2012-10-16 NOTE — Progress Notes (Signed)
CRITICAL VALUE ALERT  Critical value received:  Potassium 6.8  Date of notification:  10/16/12  Time of notification:  1250  Critical value read back:yes  Nurse who received alert:  R.Cariana Karge RN  MD notified (1st page):  Dr. Derrell Lolling  Time of first page:  1255  MD notified (2nd page):  Time of second page:  Responding MD:  Dr. Derrell Lolling  Time MD responded:  1255

## 2012-10-16 NOTE — Telephone Encounter (Signed)
Per Cyndra Numbers and Dr. Abbey Chatters an order for BMET was placed for patient.  Called and spoke to patient's daughter Lenn Sink who states understanding of the plan and agreeable at this time.  Daughter will take patient tomorrow to [facility] to get blood drawn.

## 2012-10-16 NOTE — Progress Notes (Signed)
Pt informed of the need for fleets enema the night before surgery. Pt stated that the enema did not work last time and caused a lot of pain. Please address

## 2012-10-16 NOTE — Telephone Encounter (Signed)
Pt notified and Blue bowel prep sheet put in mail for pt...also went over instructions over the phone and pt verbalized agreement with POC at this time...she was sooo happy that she did not have to preform another fleets enema

## 2012-10-16 NOTE — Patient Instructions (Addendum)
20 [name]  10/16/2012   Your procedure is scheduled on: 10/21/12  Report to [facility] at 5:15 AM.  Call this number if you have problems the morning of surgery [phone]   Remember: please call Dr. Derrell Lolling to find out about bowel prep, please bring inhaler on day of surgery   Do not eat food or drink liquids After Midnight.     Take these medicines the morning of surgery with A SIP OF WATER: inhalers if needed   Do not wear jewelry, make-up or nail polish.  Do not wear lotions, powders, or perfumes. You may wear deodorant.  Do not shave 48 hours prior to surgery. Men may shave face and neck.  Do not bring valuables to the hospital.  Contacts, dentures or bridgework may not be worn into surgery.  Leave suitcase in the car. After surgery it may be brought to your room.  For patients admitted to the hospital, checkout time is 11:00 AM the day of discharge.    Please read over the following fact sheets that you were given: blood fact sheet Birdie Sons, RN  pre op nurse call if needed [phone]    FAILURE TO FOLLOW THESE INSTRUCTIONS MAY RESULT IN CANCELLATION OF YOUR SURGERY   Patient Signature: ___________________________________________

## 2012-10-16 NOTE — Progress Notes (Signed)
Chest x-ray 03/23/12 on EP[facility], EKG 09/04/12 on EPIC, cardiac clearance 09/18/12 on EPIC

## 2012-10-16 NOTE — Telephone Encounter (Signed)
2 bottle of mag citrate which she can purchase at the drug store.

## 2012-10-16 NOTE — Telephone Encounter (Signed)
Fleet Contras, RN, calling with abnormal K of 6.8 from pre-op lab work.  Pt is scheduled for laparoscopic colostomy takedown and lysis of adhesions on 10/21/12 by Dr. Derrell Lolling.  Dr. Derrell Lolling also notified by staff message by Cyndra Numbers, CMA.

## 2012-10-16 NOTE — Telephone Encounter (Signed)
Message copied by June Leap on Thu Oct 16, 2012  2:48 PM ------      Message from: Larry Sierras      Created: Thu Oct 16, 2012  2:20 PM      Regarding: PRE OP QUESTION      Contact: [phone]       PT HAS REQ FOR DIFFERENT PREP PRIOR TO SX 10-21-12/GM ------

## 2012-10-17 LAB — BASIC METABOLIC PANEL
CO2: 28 mEq/L (ref 19–32)
Calcium: 9.2 mg/dL (ref 8.4–10.5)
Chloride: 104 mEq/L (ref 96–112)
Glucose, Bld: 94 mg/dL (ref 70–99)
Sodium: 138 mEq/L (ref 135–145)

## 2012-10-20 ENCOUNTER — Telehealth (INDEPENDENT_AMBULATORY_CARE_PROVIDER_SITE_OTHER): Payer: Self-pay

## 2012-10-20 NOTE — Anesthesia Preprocedure Evaluation (Addendum)
Anesthesia Evaluation  Patient identified by MRN, date of birth, ID band Patient awake    Reviewed: Allergy & Precautions, H&P , NPO status , Patient's Chart, lab work & pertinent test results  History of Anesthesia Complications Negative for: history of anesthetic complications  Airway Mallampati: I TM Distance: >3 FB Neck ROM: Full    Dental  (+) Edentulous Upper and Edentulous Lower   Pulmonary shortness of breath, COPDformer smoker,  breath sounds clear to auscultation        Cardiovascular hypertension, Pt. on medications +CHF + dysrhythmias Rhythm:Regular Rate:Normal     Neuro/Psych PSYCHIATRIC DISORDERS    GI/Hepatic Neg liver ROS, Bowel prep,  Endo/Other  Hypothyroidism   Renal/GU negative Renal ROS     Musculoskeletal   Abdominal   Peds  Hematology  (+) Blood dyscrasia, anemia ,   Anesthesia Other Findings   Reproductive/Obstetrics                         Anesthesia Physical Anesthesia Plan  ASA: III  Anesthesia Plan: General   Post-op Pain Management:    Induction: Intravenous  Airway Management Planned: Oral ETT  Additional Equipment:   Intra-op Plan:   Post-operative Plan: Extubation in OR  Informed Consent: I have reviewed the patients History and Physical, chart, labs and discussed the procedure including the risks, benefits and alternatives for the proposed anesthesia with the patient or authorized representative who has indicated his/her understanding and acceptance.   Dental advisory given  Plan Discussed with: CRNA  Anesthesia Plan Comments:         Anesthesia Quick Evaluation

## 2012-10-20 NOTE — Telephone Encounter (Signed)
Called and spoke to patient to make patient aware that per Dr. Derrell Lolling patient need's to complete a  pre-op enema through rectum.  Patient states "I don't think I can do that, cause of hemorrhoids" patient made aware that she need's to complete this enema through rectum today.

## 2012-10-20 NOTE — Progress Notes (Signed)
Writer called patient and asked her if surgeon told her to do fleet enema at home. Patient states she was told about fleet enema at home.

## 2012-10-21 ENCOUNTER — Encounter (HOSPITAL_COMMUNITY)
Admission: RE | Disposition: A | Discharge status: Home Health | Payer: Self-pay | Source: Ambulatory Visit | Attending: General Surgery

## 2012-10-21 ENCOUNTER — Inpatient Hospital Stay (HOSPITAL_COMMUNITY): Payer: Medicare Other | Admitting: Anesthesiology

## 2012-10-21 ENCOUNTER — Inpatient Hospital Stay (HOSPITAL_COMMUNITY)
Admission: RE | Admit: 2012-10-21 | Discharge: 2012-10-27 | DRG: 336 | Disposition: A | Discharge status: Home Health | Payer: Medicare Other | Source: Ambulatory Visit | Attending: General Surgery | Admitting: General Surgery

## 2012-10-21 ENCOUNTER — Encounter (HOSPITAL_COMMUNITY): Payer: Medicare Other | Admitting: Anesthesiology

## 2012-10-21 ENCOUNTER — Encounter (HOSPITAL_COMMUNITY): Payer: Self-pay

## 2012-10-21 DIAGNOSIS — Z9889 Other specified postprocedural states: Secondary | ICD-10-CM

## 2012-10-21 DIAGNOSIS — Z01812 Encounter for preprocedural laboratory examination: Secondary | ICD-10-CM

## 2012-10-21 DIAGNOSIS — K660 Peritoneal adhesions (postprocedural) (postinfection): Secondary | ICD-10-CM

## 2012-10-21 DIAGNOSIS — J4489 Other specified chronic obstructive pulmonary disease: Secondary | ICD-10-CM | POA: Diagnosis present

## 2012-10-21 DIAGNOSIS — Z433 Encounter for attention to colostomy: Principal | ICD-10-CM

## 2012-10-21 DIAGNOSIS — N736 Female pelvic peritoneal adhesions (postinfective): Secondary | ICD-10-CM | POA: Diagnosis present

## 2012-10-21 DIAGNOSIS — K5730 Diverticulosis of large intestine without perforation or abscess without bleeding: Secondary | ICD-10-CM | POA: Diagnosis present

## 2012-10-21 DIAGNOSIS — Z9049 Acquired absence of other specified parts of digestive tract: Secondary | ICD-10-CM

## 2012-10-21 DIAGNOSIS — I509 Heart failure, unspecified: Secondary | ICD-10-CM | POA: Diagnosis present

## 2012-10-21 DIAGNOSIS — J449 Chronic obstructive pulmonary disease, unspecified: Secondary | ICD-10-CM | POA: Diagnosis present

## 2012-10-21 DIAGNOSIS — I5042 Chronic combined systolic (congestive) and diastolic (congestive) heart failure: Secondary | ICD-10-CM | POA: Diagnosis present

## 2012-10-21 DIAGNOSIS — I10 Essential (primary) hypertension: Secondary | ICD-10-CM | POA: Diagnosis present

## 2012-10-21 HISTORY — DX: Pneumonia, unspecified organism: J18.9

## 2012-10-21 HISTORY — PX: LYSIS OF ADHESION: SHX5961

## 2012-10-21 HISTORY — PX: COLOSTOMY TAKEDOWN: SHX5258

## 2012-10-21 HISTORY — DX: Other specified diseases of intestine: K63.89

## 2012-10-21 HISTORY — DX: Diverticulitis of large intestine with perforation and abscess without bleeding: K57.20

## 2012-10-21 HISTORY — DX: Peritoneal abscess: K65.1

## 2012-10-21 LAB — CBC
Hemoglobin: 12.9 g/dL (ref 12.0–15.0)
MCH: 30.9 pg (ref 26.0–34.0)
MCHC: 34.5 g/dL (ref 30.0–36.0)
MCV: 89.5 fL (ref 78.0–100.0)
Platelets: 281 10*3/uL (ref 150–400)

## 2012-10-21 LAB — CREATININE, SERUM: Creatinine, Ser: 0.9 mg/dL (ref 0.50–1.10)

## 2012-10-21 SURGERY — CLOSURE, COLOSTOMY, LAPAROSCOPIC
Anesthesia: General | Wound class: Contaminated

## 2012-10-21 MED ORDER — FLEET ENEMA 7-19 GM/118ML RE ENEM: 1.0000 | ENEMA | Freq: Once | RECTAL | Status: DC

## 2012-10-21 MED ORDER — PROPOFOL 10 MG/ML IV BOLUS
INTRAVENOUS | Status: DC | PRN
  Administered 2012-10-21: 100 mg via INTRAVENOUS

## 2012-10-21 MED ORDER — ALVIMOPAN 12 MG PO CAPS
12.0000 mg | ORAL_CAPSULE | Freq: Two times a day (BID) | ORAL | Status: DC
  Administered 2012-10-22 – 2012-10-23 (×3): 12 mg via ORAL
  Filled 2012-10-21 (×5): qty 1

## 2012-10-21 MED ORDER — FENTANYL CITRATE 0.05 MG/ML IJ SOLN
INTRAMUSCULAR | Status: DC | PRN
  Administered 2012-10-21: 25 ug via INTRAVENOUS
  Administered 2012-10-21 (×7): 50 ug via INTRAVENOUS
  Administered 2012-10-21: 25 ug via INTRAVENOUS
  Administered 2012-10-21: 50 ug via INTRAVENOUS

## 2012-10-21 MED ORDER — DEXTROSE 5 % IV SOLN
2.0000 g | Freq: Two times a day (BID) | INTRAVENOUS | Status: AC
  Administered 2012-10-21: 2 g via INTRAVENOUS
  Filled 2012-10-21: qty 2

## 2012-10-21 MED ORDER — HYDROMORPHONE 0.3 MG/ML IV SOLN
INTRAVENOUS | Status: DC
  Administered 2012-10-21: 0.3 mg via INTRAVENOUS
  Administered 2012-10-21: 0.599 mg via INTRAVENOUS
  Administered 2012-10-22: 1.19 mg via INTRAVENOUS
  Administered 2012-10-22: 1.79 mg via INTRAVENOUS
  Administered 2012-10-22: 0.7 mg via INTRAVENOUS

## 2012-10-21 MED ORDER — LACTATED RINGERS IR SOLN
Status: DC | PRN
  Administered 2012-10-21: 1000 mL

## 2012-10-21 MED ORDER — ENOXAPARIN SODIUM 40 MG/0.4ML ~~LOC~~ SOLN
40.0000 mg | SUBCUTANEOUS | Status: DC
  Administered 2012-10-22 – 2012-10-27 (×6): 40 mg via SUBCUTANEOUS
  Filled 2012-10-21 (×8): qty 0.4

## 2012-10-21 MED ORDER — LIDOCAINE HCL (CARDIAC) 20 MG/ML IV SOLN
INTRAVENOUS | Status: DC | PRN
  Administered 2012-10-21: 50 mg via INTRAVENOUS

## 2012-10-21 MED ORDER — DIPHENHYDRAMINE HCL 12.5 MG/5ML PO ELIX: 12.5000 mg | ORAL_SOLUTION | Freq: Four times a day (QID) | ORAL | Status: DC | PRN

## 2012-10-21 MED ORDER — NEOSTIGMINE METHYLSULFATE 1 MG/ML IJ SOLN
INTRAMUSCULAR | Status: DC | PRN
  Administered 2012-10-21: 3.5 mg via INTRAVENOUS

## 2012-10-21 MED ORDER — HYDROMORPHONE HCL PF 1 MG/ML IJ SOLN
INTRAMUSCULAR | Status: AC
  Filled 2012-10-21: qty 1

## 2012-10-21 MED ORDER — ONDANSETRON HCL 4 MG/2ML IJ SOLN
INTRAMUSCULAR | Status: DC | PRN
  Administered 2012-10-21: 4 mg via INTRAVENOUS

## 2012-10-21 MED ORDER — GLYCOPYRROLATE 0.2 MG/ML IJ SOLN
INTRAMUSCULAR | Status: DC | PRN
  Administered 2012-10-21: .5 mg via INTRAVENOUS

## 2012-10-21 MED ORDER — PANTOPRAZOLE SODIUM 40 MG IV SOLR
40.0000 mg | INTRAVENOUS | Status: DC
  Administered 2012-10-21 – 2012-10-23 (×3): 40 mg via INTRAVENOUS
  Filled 2012-10-21 (×4): qty 40

## 2012-10-21 MED ORDER — BUPIVACAINE-EPINEPHRINE PF 0.25-1:200000 % IJ SOLN
INTRAMUSCULAR | Status: AC
  Filled 2012-10-21: qty 30

## 2012-10-21 MED ORDER — 0.9 % SODIUM CHLORIDE (POUR BTL) OPTIME
TOPICAL | Status: DC | PRN
  Administered 2012-10-21: 2000 mL

## 2012-10-21 MED ORDER — DIPHENHYDRAMINE HCL 50 MG/ML IJ SOLN: 12.5000 mg | Freq: Four times a day (QID) | INTRAMUSCULAR | Status: DC | PRN

## 2012-10-21 MED ORDER — ALBUTEROL SULFATE HFA 108 (90 BASE) MCG/ACT IN AERS
2.0000 | INHALATION_SPRAY | Freq: Four times a day (QID) | RESPIRATORY_TRACT | Status: DC | PRN
  Filled 2012-10-21: qty 6.7

## 2012-10-21 MED ORDER — BUPIVACAINE-EPINEPHRINE 0.25% -1:200000 IJ SOLN
INTRAMUSCULAR | Status: DC | PRN
  Administered 2012-10-21: 10 mL

## 2012-10-21 MED ORDER — BUDESONIDE-FORMOTEROL FUMARATE 160-4.5 MCG/ACT IN AERO
2.0000 | INHALATION_SPRAY | Freq: Two times a day (BID) | RESPIRATORY_TRACT | Status: DC
  Administered 2012-10-21 – 2012-10-27 (×11): 2 via RESPIRATORY_TRACT
  Filled 2012-10-21 (×2): qty 6

## 2012-10-21 MED ORDER — DEXTROSE 5 % IV SOLN
2.0000 g | INTRAVENOUS | Status: AC
  Administered 2012-10-21: 2 g via INTRAVENOUS
  Filled 2012-10-21: qty 2

## 2012-10-21 MED ORDER — LACTATED RINGERS IV SOLN: INTRAVENOUS | Status: DC

## 2012-10-21 MED ORDER — KCL IN DEXTROSE-NACL 40-5-0.9 MEQ/L-%-% IV SOLN
INTRAVENOUS | Status: DC
  Administered 2012-10-21 – 2012-10-23 (×5): via INTRAVENOUS
  Filled 2012-10-21 (×9): qty 1000

## 2012-10-21 MED ORDER — LISINOPRIL 10 MG PO TABS
10.0000 mg | ORAL_TABLET | Freq: Every morning | ORAL | Status: DC
  Administered 2012-10-21 – 2012-10-27 (×5): 10 mg via ORAL
  Filled 2012-10-21 (×7): qty 1

## 2012-10-21 MED ORDER — HYDROMORPHONE HCL PF 1 MG/ML IJ SOLN
0.2500 mg | INTRAMUSCULAR | Status: DC | PRN
  Administered 2012-10-21 (×2): 0.5 mg via INTRAVENOUS

## 2012-10-21 MED ORDER — SODIUM CHLORIDE 0.9 % IJ SOLN: 9.0000 mL | INTRAMUSCULAR | Status: DC | PRN

## 2012-10-21 MED ORDER — ONDANSETRON HCL 4 MG/2ML IJ SOLN
4.0000 mg | Freq: Four times a day (QID) | INTRAMUSCULAR | Status: DC | PRN
  Administered 2012-10-21 – 2012-10-22 (×2): 4 mg via INTRAVENOUS
  Filled 2012-10-21 (×2): qty 2

## 2012-10-21 MED ORDER — ROCURONIUM BROMIDE 100 MG/10ML IV SOLN
INTRAVENOUS | Status: DC | PRN
  Administered 2012-10-21: 5 mg via INTRAVENOUS
  Administered 2012-10-21: 50 mg via INTRAVENOUS

## 2012-10-21 MED ORDER — FUROSEMIDE 40 MG PO TABS
40.0000 mg | ORAL_TABLET | Freq: Every morning | ORAL | Status: DC
  Administered 2012-10-21: 40 mg via ORAL
  Filled 2012-10-21 (×2): qty 1

## 2012-10-21 MED ORDER — HYDROMORPHONE 0.3 MG/ML IV SOLN
INTRAVENOUS | Status: AC
  Filled 2012-10-21: qty 25

## 2012-10-21 MED ORDER — NALOXONE HCL 0.4 MG/ML IJ SOLN: 0.4000 mg | INTRAMUSCULAR | Status: DC | PRN

## 2012-10-21 MED ORDER — LACTATED RINGERS IV SOLN
INTRAVENOUS | Status: DC | PRN
  Administered 2012-10-21: 07:00:00 via INTRAVENOUS

## 2012-10-21 MED ORDER — ALVIMOPAN 12 MG PO CAPS
12.0000 mg | ORAL_CAPSULE | Freq: Once | ORAL | Status: AC
  Administered 2012-10-21: 12 mg via ORAL
  Filled 2012-10-21: qty 1

## 2012-10-21 SURGICAL SUPPLY — 95 items
APPLIER CLIP 5 13 M/L LIGAMAX5 (MISCELLANEOUS)
APPLIER CLIP ROT 10 11.4 M/L (STAPLE)
BENZOIN TINCTURE PRP APPL 2/3 (GAUZE/BANDAGES/DRESSINGS) ×2 IMPLANT
BLADE EXTENDED COATED 6.5IN (ELECTRODE) ×2 IMPLANT
BLADE HEX COATED 2.75 (ELECTRODE) ×2 IMPLANT
BLADE SURG SZ10 CARB STEEL (BLADE) ×4 IMPLANT
CANISTER SUCTION 2500CC (MISCELLANEOUS) ×2 IMPLANT
CELLS DAT CNTRL 66122 CELL SVR (MISCELLANEOUS) IMPLANT
CHLORAPREP W/TINT 26ML (MISCELLANEOUS) ×2 IMPLANT
CLAMP ENDO BABCK 10MM (STAPLE) IMPLANT
CLIP APPLIE 5 13 M/L LIGAMAX5 (MISCELLANEOUS) IMPLANT
CLIP APPLIE ROT 10 11.4 M/L (STAPLE) IMPLANT
CLOSURE STERI-STRIP 1/4X4 (GAUZE/BANDAGES/DRESSINGS) ×2 IMPLANT
CLOTH BEACON ORANGE TIMEOUT ST (SAFETY) IMPLANT
CONNECTOR 5 IN 1 STRAIGHT STRL (MISCELLANEOUS) IMPLANT
COVER MAYO STAND STRL (DRAPES) ×4 IMPLANT
COVER SURGICAL LIGHT HANDLE (MISCELLANEOUS) IMPLANT
DECANTER SPIKE VIAL GLASS SM (MISCELLANEOUS) IMPLANT
DEVICE TROCAR PUNCTURE CLOSURE (ENDOMECHANICALS) IMPLANT
DRAIN CHANNEL 19F RND (DRAIN) ×2 IMPLANT
DRAPE LAPAROSCOPIC ABDOMINAL (DRAPES) ×2 IMPLANT
DRAPE LG THREE QUARTER DISP (DRAPES) ×4 IMPLANT
DRAPE UTILITY XL STRL (DRAPES) ×2 IMPLANT
DRAPE WARM FLUID 44X44 (DRAPE) ×2 IMPLANT
ELECT REM PT RETURN 9FT ADLT (ELECTROSURGICAL) ×2
ELECTRODE REM PT RTRN 9FT ADLT (ELECTROSURGICAL) ×1 IMPLANT
ENSEAL DEVICE STD TIP 35CM (ENDOMECHANICALS) IMPLANT
EVACUATOR SILICONE 100CC (DRAIN) ×2 IMPLANT
GLOVE BIO SURGEON STRL SZ7.5 (GLOVE) ×4 IMPLANT
GLOVE BIOGEL PI IND STRL 7.0 (GLOVE) ×7 IMPLANT
GLOVE BIOGEL PI IND STRL 8 (GLOVE) ×5 IMPLANT
GLOVE BIOGEL PI INDICATOR 7.0 (GLOVE) ×7
GLOVE BIOGEL PI INDICATOR 8 (GLOVE) ×5
GLOVE ECLIPSE 7.0 STRL STRAW (GLOVE) ×2 IMPLANT
GLOVE ECLIPSE 8.0 STRL XLNG CF (GLOVE) ×4 IMPLANT
GLOVE SS BIOGEL STRL SZ 7 (GLOVE) ×3 IMPLANT
GLOVE SUPERSENSE BIOGEL SZ 7 (GLOVE) ×3
GOWN PREVENTION PLUS LG XLONG (DISPOSABLE) IMPLANT
GOWN STRL NON-REIN LRG LVL3 (GOWN DISPOSABLE) ×2 IMPLANT
GOWN STRL REIN 3XL XLG LVL4 (GOWN DISPOSABLE) ×4 IMPLANT
GOWN STRL REIN XL XLG (GOWN DISPOSABLE) ×16 IMPLANT
KIT BASIN OR (CUSTOM PROCEDURE TRAY) ×4 IMPLANT
LEGGING LITHOTOMY PAIR STRL (DRAPES) ×2 IMPLANT
LIGASURE IMPACT 36 18CM CVD LR (INSTRUMENTS) IMPLANT
NEEDLE INSUFFLATION 14GA 120MM (NEEDLE) ×2 IMPLANT
NS IRRIG 1000ML POUR BTL (IV SOLUTION) ×4 IMPLANT
PENCIL BUTTON HOLSTER BLD 10FT (ELECTRODE) ×4 IMPLANT
RTRCTR WOUND ALEXIS 18CM MED (MISCELLANEOUS)
SCALPEL HARMONIC ACE (MISCELLANEOUS) IMPLANT
SCISSORS LAP 5X35 DISP (ENDOMECHANICALS) ×2 IMPLANT
SET IRRIG TUBING LAPAROSCOPIC (IRRIGATION / IRRIGATOR) ×2 IMPLANT
SLEEVE XCEL OPT CAN 5 100 (ENDOMECHANICALS) ×6 IMPLANT
SOLUTION ANTI FOG 6CC (MISCELLANEOUS) ×2 IMPLANT
SPONGE DRAIN TRACH 4X4 STRL 2S (GAUZE/BANDAGES/DRESSINGS) ×2 IMPLANT
SPONGE GAUZE 4X4 12PLY (GAUZE/BANDAGES/DRESSINGS) ×2 IMPLANT
SPONGE LAP 18X18 X RAY DECT (DISPOSABLE) ×4 IMPLANT
STAPLER CIRC CVD 29MM 37CM (STAPLE) ×2 IMPLANT
STAPLER VISISTAT 35W (STAPLE) ×2 IMPLANT
STRIP CLOSURE SKIN 1/2X4 (GAUZE/BANDAGES/DRESSINGS) IMPLANT
SUCTION POOLE TIP (SUCTIONS) ×2 IMPLANT
SUT NYLON 3 0 (SUTURE) ×8 IMPLANT
SUT PDS AB 1 CT1 27 (SUTURE) IMPLANT
SUT PDS AB 1 CTX 36 (SUTURE) IMPLANT
SUT PDS AB 4-0 SH 27 (SUTURE) IMPLANT
SUT PROLENE 0 CT 2 (SUTURE) ×2 IMPLANT
SUT PROLENE 0 SH 30 (SUTURE) ×2 IMPLANT
SUT PROLENE 2 0 KS (SUTURE) IMPLANT
SUT SILK 2 0 (SUTURE) ×1
SUT SILK 2 0 SH CR/8 (SUTURE) ×2 IMPLANT
SUT SILK 2-0 18XBRD TIE 12 (SUTURE) ×1 IMPLANT
SUT SILK 3 0 (SUTURE) ×1
SUT SILK 3 0 SH 30 (SUTURE) ×2 IMPLANT
SUT SILK 3 0 SH CR/8 (SUTURE) ×2 IMPLANT
SUT SILK 3-0 18XBRD TIE 12 (SUTURE) ×1 IMPLANT
SUT VIC AB 1 CT1 36 (SUTURE) ×2 IMPLANT
SUT VIC AB 2-0 CT1 27 (SUTURE)
SUT VIC AB 2-0 CT1 27XBRD (SUTURE) IMPLANT
SUT VIC AB 3-0 PS2 18 (SUTURE)
SUT VIC AB 3-0 PS2 18XBRD (SUTURE) IMPLANT
SUT VIC AB 4-0 SH 18 (SUTURE) IMPLANT
SUT VICRYL 0 UR6 27IN ABS (SUTURE) IMPLANT
SYR 30ML LL (SYRINGE) IMPLANT
SYR BULB IRRIGATION 50ML (SYRINGE) ×2 IMPLANT
TAPE CLOTH SURG 4X10 WHT LF (GAUZE/BANDAGES/DRESSINGS) ×2 IMPLANT
TOWEL OR 17X26 10 PK STRL BLUE (TOWEL DISPOSABLE) ×4 IMPLANT
TOWEL OR NON WOVEN STRL DISP B (DISPOSABLE) ×2 IMPLANT
TRAY FOLEY CATH 14FRSI W/METER (CATHETERS) ×2 IMPLANT
TRAY LAP CHOLE (CUSTOM PROCEDURE TRAY) ×2 IMPLANT
TROCAR BLADELESS OPT 5 100 (ENDOMECHANICALS) ×2 IMPLANT
TROCAR XCEL NON-BLD 11X100MML (ENDOMECHANICALS) IMPLANT
TROCAR XCEL UNIV SLVE 11M 100M (ENDOMECHANICALS) IMPLANT
TUBING CONNECTING 10 (TUBING) IMPLANT
TUBING FILTER THERMOFLATOR (ELECTROSURGICAL) ×2 IMPLANT
YANKAUER SUCT BULB TIP 10FT TU (MISCELLANEOUS) ×4 IMPLANT
YANKAUER SUCT BULB TIP NO VENT (SUCTIONS) IMPLANT

## 2012-10-21 NOTE — Transfer of Care (Signed)
Immediate Anesthesia Transfer of Care Note  Patient: [name]  Procedure(s) Performed: Procedure(s): LAPAROSCOPIC COLOSTOMY TAKEDOWN AND HARTMANS ANASTOMOSIS, rigid proctoscopy (N/A) LYSIS OF ADHESION (N/A)  Patient Location: PACU  Anesthesia Type:General  Level of Consciousness: awake, alert , oriented and patient cooperative  Airway & Oxygen Therapy: Patient Spontanous Breathing and Patient connected to face mask oxygen  Post-op Assessment: Report given to PACU RN, Post -op Vital signs reviewed and stable and Patient moving all extremities  Post vital signs: Reviewed and stable  Complications: No apparent anesthesia complications

## 2012-10-21 NOTE — Op Note (Signed)
Pre Operative Diagnosis: Hartman's pouch and end colostomy   Post Operative Diagnosis: same and multiple adhesions to anterior abd wall   Procedure:Diagnostic Laparoscopy, Lysis of adhesions, Hartman's reversal and anastamosis, rigid sigmoidoscopy  Surgeon: Dr. Axel Filler   Assistant: Dr. Michaell Cowing  Anesthesia: GETA   EBL: 50cc   Complications: none  Counts: reported as correct x 2   Findings: Pt had a 29 EEA stapled anastomosis. The anastomosis was under no tension.  There were multiple small bowel to small bowel adhesions.  There was one enterotomy that was made with the lysis of adhesions, and was repaired with a 3-0 silk lembert stitch.  The bubble test was negative.  Both donuts were complete.  Indications for procedure: Pt is a 79 y/o s/p Hartman's pouch after a smoldering diverticulitis.  After workup and counseling she decided to have her ostomy reversed.  Details of the procedure:The patient was taken back to the operating room. The patient was placed in lithotomy position with bilateral SCDs in place. After appropriate anitbiotics were confirmed, a time-out was confirmed and all facts were verified. A pneumoperitoneum of 14 mmHg was obtained via Veress needle technique in the right subcostal margin. Subsequently this a 5 mm camera and trocar were then introduced there's no acute intra-abdominal organ injury. There seemed to be a moderate amount of adhesions to the anterior midline of omentum and small bowel adhesion. At this time the right lower quadrant 5 mm trocar was in place a direct visualization. At this time proceeded to take down the omental adhesions to the anterior abdominal wall.  Lysis of adhesions took approximately 60 minutes. A 5 mm trochar was placed in the right mid clavicular line in between the other two trchars under direct visualization. A fourth working trocar was then placed in the left suprapubically under direct visualization. At this time I was able to take  down the peritoneal adhesions from the ostomy.   Once these were taken down we turned our attention to the pelvis. There were a moderate amount of small bowel to small bowel adhesions which were taken down sharply. An enterotomy was made while lysiing some of these pelvic adhesions.  It was repaired with 1 3-0 silk lembert stitch.  I was able to milk the small bowel back forth after the repair, and there was no leak of succus.  The rectum was identified as were the  Prolenes that were used to tag the rectal stump.   The rectal stump was very adherent to the lateral pelvic wall and was slowly dissected away from the surrounding tissue and pelvic wall.  Once this was sufficiently dissected away circumferentially, we proceeded to take down the ostomy from the skin level.  An elliptical incision was made around the colostomy. The peritoneum was entered which assisted in dissecting away the ostomy from the surrounding tissue.  Were able to identify the rectal stump. At this time we checked to see if there was enough length from the colostomy/descending colon down to the rectal stump. The length was adequate, and there was no undue tension.   The distal portion of the ostomy was then resected to healthy tissue. The tissue at the end portion of the resection was bleeding and viable.  At this time sizers were used to size the colon. A size 29 sizer fit without undue tension.  At this time a purse-sting stitch was used to secure the anvil in place.  Anvil and the descending colon was then dropped back into  the abdomen. The skin was then closed with penetrating towel clips to maintain pneumoperitoneum. Insufflation was restarted.  At this time Dr. Michaell Cowing proceeded with a rigid sigmoidoscopy from below.  We again checked for adequate length of the colon, which was adequate and with undue tension. The EEA stapler was advanced.  The stapler was then advanced to the end of the rectal stump with ease and the spike deployed.   At this time the anvil was advanced over the spike and the EEA stapler was deployed in the standard fashion. Upon examining the donuts there were 2 complete donuts. We proceeded at this time with a bubble test laparoscopically. There was a negative bubble leak test.   At this time the ostomy site was closed with a #1 Vicryl in a running standard fashion.  We examined the peritoneal cavity with laparoscopy and there were no omental adhesions or bowel was caught within the suture lines. This time the pneumoperitoneum was evacuated. 2-0 nylons x3 were used and placed over the skin ostomy site, these were not sutured down. Betadine soaked gauze was used and placed into the wound. The midline wound and all trocar sites were reapproximated using skin staples.   The patient was taken to the recovery room in stable condition.

## 2012-10-21 NOTE — Anesthesia Postprocedure Evaluation (Signed)
Anesthesia Post Note  Patient: [name]  Procedure(s) Performed: Procedure(s) (LRB): LAPAROSCOPIC COLOSTOMY TAKEDOWN AND HARTMANS ANASTOMOSIS, rigid proctoscopy (N/A) LYSIS OF ADHESION (N/A)  Anesthesia type: General  Patient location: PACU  Post pain: Pain level controlled  Post assessment: Post-op Vital signs reviewed  Last Vitals:  Filed Vitals:   10/21/12 1430  BP: 134/65  Pulse: 63  Temp: 36.4 C  Resp: 16    Post vital signs: Reviewed  Level of consciousness: sedated  Complications: No apparent anesthesia complications

## 2012-10-21 NOTE — Preoperative (Signed)
Beta Blockers   Reason not to administer Beta Blockers:Not Applicable 

## 2012-10-21 NOTE — H&P (Signed)
  HPI  The patient is a 79 year old female with a previous history of a sigmoid resection secondary to diverticulitis. Patient most recently underwent a barium enema which in no irregular masses. Patient had seen Dr. Arlyce Dice for initial evaluation for a colonoscopy but subsequent to misunderstanding patient has not had a colonoscopy.  The patient otherwise states that her ostomy is functioning well and has been gaining weight.  Review of Systems  Constitutional: Negative.  HENT: Negative.  Respiratory: Negative.  Cardiovascular: Negative.  Gastrointestinal: Negative.  Neurological: Negative.  All other systems reviewed and are negative.  Objective:   Physical Exam  Constitutional: She is oriented to person, place, and time. She appears well-developed and well-nourished.  HENT:  Head: Normocephalic and atraumatic.  Eyes: Conjunctivae and EOM are normal. Pupils are equal, round, and reactive to light.  Neck: Normal range of motion. Neck supple.  Cardiovascular: Normal rate, regular rhythm and normal heart sounds.  Pulmonary/Chest: Effort normal and breath sounds normal.  Abdominal: Soft. Bowel sounds are normal.  Musculoskeletal: Normal range of motion.  Neurological: She is alert and oriented to person, place, and time.  Assessment:   A 79 year old female status post sigmoid resection and an end ostomy with Hartman's procedure.  1. To OR for hartman's reversal and ostomy takedown, possible ostomy. 2. All risks and benefits were discussed with the patient to include but not limited to : staple line dehiscence, wound complication, abscess, hematoma, and need for further surgery.  The patient voiced understanding and wishes to proceed.

## 2012-10-22 ENCOUNTER — Encounter (HOSPITAL_COMMUNITY): Payer: Self-pay | Admitting: General Surgery

## 2012-10-22 LAB — CBC
HCT: 33.5 % — ABNORMAL LOW (ref 36.0–46.0)
Hemoglobin: 11.3 g/dL — ABNORMAL LOW (ref 12.0–15.0)
MCH: 31.4 pg (ref 26.0–34.0)
MCHC: 33.7 g/dL (ref 30.0–36.0)
MCV: 93.1 fL (ref 78.0–100.0)
RDW: 12.2 % (ref 11.5–15.5)

## 2012-10-22 LAB — BASIC METABOLIC PANEL
BUN: 22 mg/dL (ref 6–23)
CO2: 26 mEq/L (ref 19–32)
Chloride: 102 mEq/L (ref 96–112)
Creatinine, Ser: 1.32 mg/dL — ABNORMAL HIGH (ref 0.50–1.10)
GFR calc non Af Amer: 36 mL/min — ABNORMAL LOW (ref >90)
Glucose, Bld: 144 mg/dL — ABNORMAL HIGH (ref 70–99)
Potassium: 4.7 mEq/L (ref 3.5–5.1)

## 2012-10-22 MED ORDER — ACETAMINOPHEN 500 MG PO TABS: 1000.0000 mg | ORAL_TABLET | Freq: Four times a day (QID) | ORAL | Status: DC | PRN

## 2012-10-22 MED ORDER — SODIUM CHLORIDE 0.9 % IV BOLUS (SEPSIS)
500.0000 mL | Freq: Once | INTRAVENOUS | Status: AC
  Administered 2012-10-22: 500 mL via INTRAVENOUS

## 2012-10-22 MED ORDER — HYDROMORPHONE HCL PF 1 MG/ML IJ SOLN
0.5000 mg | INTRAMUSCULAR | Status: AC | PRN
  Administered 2012-10-22: 0.5 mg via INTRAVENOUS
  Filled 2012-10-22: qty 1

## 2012-10-22 MED ORDER — ACETAMINOPHEN 325 MG PO TABS: 650.0000 mg | ORAL_TABLET | Freq: Four times a day (QID) | ORAL | Status: DC | PRN

## 2012-10-22 MED ORDER — HYDROMORPHONE HCL PF 1 MG/ML IJ SOLN
INTRAMUSCULAR | Status: AC
  Filled 2012-10-22: qty 1

## 2012-10-22 MED ORDER — HYDROMORPHONE HCL PF 1 MG/ML IJ SOLN
0.5000 mg | INTRAMUSCULAR | Status: DC | PRN
  Administered 2012-10-22 – 2012-10-23 (×4): 0.5 mg via INTRAVENOUS
  Filled 2012-10-22 (×3): qty 1

## 2012-10-22 NOTE — Evaluation (Signed)
Physical Therapy Evaluation Patient Details Name: [name] MRN: [id number] DOB: 09-May-1925 Today's Date: 10/22/2012 Time: 6578-4696 PT Time Calculation (min): 14 min  PT Assessment / Plan / Recommendation History of Present Illness  79 year old female s/p LAPAROSCOPIC COLOSTOMY TAKEDOWN AND HARTMANS ANASTOMOSIS, rigid proctoscopy   Clinical Impression  Pt s/p Diagnostic Laparoscopy, Lysis of adhesions, Hartman's reversal and anastamosis, rigid sigmoidoscopy. Pt currently with functional limitations due to the deficits listed below (see PT Problem List).  Pt will benefit from skilled PT to increase their independence and safety with mobility to allow discharge to the venue listed below.  Pt lives alone and currently states no 24/7 assist available so recommend ST-SNF at this time.  Pt would like to progress to home if possible.     PT Assessment  Patient needs continued PT services    Follow Up Recommendations  SNF;Supervision/Assistance - 24 hour    Does the patient have the potential to tolerate intense rehabilitation      Barriers to Discharge        Equipment Recommendations  None recommended by PT    Recommendations for Other Services     Frequency Min 3X/week    Precautions / Restrictions Precautions Precautions: Fall Precaution Comments: JP drain   Pertinent Vitals/Pain Pt reports no pain end of session, pt on PCA however trying not to use due to nausea SaO2 86% room air during ambulation so reapplied O2 [place] to wall upon return to room     Mobility  Bed Mobility Bed Mobility: Sit to Sidelying Right Sit to Sidelying Right: 3: Mod assist Details for Bed Mobility Assistance: verbal cue for log roll technique for abdomen pain control, assist required for LEs onto bed Transfers Transfers: Sit to Stand;Stand to Sit Sit to Stand: 4: Min guard;From toilet Stand to Sit: 4: Min guard;To bed Details for Transfer Assistance: verbal cues for safe  technique Ambulation/Gait Ambulation/Gait Assistance: 4: Min assist Ambulation Distance (Feet): 100 Feet Assistive device: Rolling walker Ambulation/Gait Assistance Details: verbal cues for safe RW distance, SaO2 down to 86% on room air so reapplied O2 to wall upon return to bed, pt on PCA pump Gait Pattern: Step-through pattern;Decreased stride length;Trunk flexed Gait velocity: decreased    Exercises     PT Diagnosis: Difficulty walking  PT Problem List: Decreased strength;Decreased activity tolerance;Decreased mobility;Decreased knowledge of use of DME PT Treatment Interventions: DME instruction;Gait training;Functional mobility training;Stair training;Therapeutic activities;Therapeutic exercise;Patient/family education     PT Goals(Current goals can be found in the care plan section) Acute Rehab PT Goals Patient Stated Goal: return home if possible PT Goal Formulation: With patient Time For Goal Achievement: 11/05/12 Potential to Achieve Goals: Good  Visit Information  Last PT Received On: 10/22/12 Assistance Needed: +1 History of Present Illness: 79 year old female s/p LAPAROSCOPIC COLOSTOMY TAKEDOWN AND HARTMANS ANASTOMOSIS, rigid proctoscopy        Prior Functioning  Home Living Family/patient expects to be discharged to:: Private residence Living Arrangements: Alone Available Help at Discharge: Available PRN/intermittently;Friend(s);Family Type of Home: House Home Access: Stairs to enter [employer] of Steps: 2 Entrance Stairs-Rails: None Home Layout: One level Home Equipment: Walker - 2 wheels;Cane - single point;Bedside commode Prior Function Level of Independence: Independent with assistive device(s) Communication Communication: No difficulties    Cognition  Cognition Arousal/Alertness: Awake/alert Behavior During Therapy: WFL for tasks assessed/performed Overall Cognitive Status: Within Functional Limits for tasks assessed    Extremity/Trunk  Assessment Lower Extremity Assessment Lower Extremity Assessment: Generalized  weakness   Balance    End of Session PT - End of Session Activity Tolerance: Patient limited by fatigue Patient left: in bed;with call bell/phone within reach;with family/visitor present  GP     Lukah Goswami,KATHrine E 10/22/2012, 3:17 PM Zenovia Jarred, PT, DPT 10/22/2012 Pager: [phone]

## 2012-10-22 NOTE — Care Management Note (Signed)
   CARE MANAGEMENT NOTE 10/22/2012  Patient:  [name], [name]   Account Number:  [id number]  Date Initiated:  10/22/2012  Documentation initiated by:  Clifford Benninger  Subjective/Objective Assessment:   79 yo female admitted s/p LAPAROSCOPIC COLOSTOMY TAKEDOWN AND HARTMANS ANASTOMOSIS, rigid proctoscopy (N/A)  LYSIS OF ADHESION (N/A)     Action/Plan:   Home when stable   Anticipated DC Date:     Anticipated DC Plan:        DC Planning Services  CM consult      Choice offered to / List presented to:  NA   DME arranged  NA      DME agency  NA     HH arranged  NA      HH agency  NA   Status of service:  In process, will continue to follow Medicare Important Message given?   (If response is "NO", the following Medicare IM given date fields will be blank) Date Medicare IM given:   Date Additional Medicare IM given:    Discharge Disposition:    Per UR Regulation:  Reviewed for med. necessity/level of care/duration of stay  If discussed at Long Length of Stay Meetings, dates discussed:    Comments:  10/22/12 0947 Roland Earl [phone] Chart reviewed for utilization of services. Post op day. No needs assessed at this time.

## 2012-10-22 NOTE — Progress Notes (Signed)
Pt complains of not being able to rest with constant beeping from PCA pump. Hx of confusion and anxiety during prior hospitalizations. Pt stating today "she feels very tense and jittery and just can't seem to relax." Pt's daughter states "she is feeling anxious because she couldn't breath earlier and she is scared it's going to happen again."  Pt having trouble moving secretions. Pt has not voided since Foley removal at 0540.  Bladder scan showed 204 mL of urine.   Per Dr. Derrell Lolling d/c PCA pump, control pain with Dilaudid 0.5 mg IV q1h PRN for moderate to severe pain and Tylenol 650 mg q6h for mild to moderate pain. Do NOT give sedatives. Ambulate patient and deep breathing and coughing exercises to help mobilize secretions. In and out cath once.  If have to in and out cath x2, leave Foley in place.  Read back and confirmed orders.

## 2012-10-22 NOTE — Progress Notes (Signed)
1 Day Post-Op  Subjective: PT doing well. No acute events overnight.  Objective: Vital signs in last 24 hours: Temp:  [97.5 F (36.4 C)-98.4 F (36.9 C)] 98.4 F (36.9 C) (10/22 0525) Pulse Rate:  [51-86] 61 (10/22 0525) Resp:  [12-24] 18 (10/22 0525) BP: (102-163)/(52-83) 102/52 mmHg (10/22 0525) SpO2:  [95 %-100 %] 98 % (10/22 0525) Weight:  [154 lb (69.854 kg)] 154 lb (69.854 kg) (10/21 0806) Last BM Date: 10/20/12  Intake/Output from previous day: 10/21 0701 - 10/22 0700 In: 3710 [I.V.:3660; IV Piggyback:50] Out: 1390 [Urine:1040; Drains:230; Blood:120] Intake/Output this shift:    General appearance: alert and cooperative Cardio: regular rate and rhythm, S1, S2 normal, no murmur, click, rub or gallop GI: soft, approp ttp, ND hypoactive BS, wounds c/d/i, JP SS  Lab Results:   Recent Labs  10/21/12 1410 10/22/12 0419  WBC 16.1* 12.5*  HGB 12.9 11.3*  HCT 37.4 33.5*  PLT 281 251   BMET  Recent Labs  10/21/12 1410 10/22/12 0419  NA  --  136  K  --  4.7  CL  --  102  CO2  --  26  GLUCOSE  --  144*  BUN  --  22  CREATININE 0.90 1.32*  CALCIUM  --  8.9   Anti-infectives: Anti-infectives   Start     Dose/Rate Route Frequency Ordered Stop   10/21/12 2000  cefoTEtan (CEFOTAN) 2 g in dextrose 5 % 50 mL IVPB     2 g 100 mL/hr over 30 Minutes Intravenous Every 12 hours 10/21/12 1249 10/21/12 2202   10/21/12 0616  cefoTEtan (CEFOTAN) 2 g in dextrose 5 % 50 mL IVPB     2 g 100 mL/hr over 30 Minutes Intravenous On call to O.R. 10/21/12 1610 10/21/12 0738      Assessment/Plan: s/p Procedure(s): LAPAROSCOPIC COLOSTOMY TAKEDOWN AND HARTMANS ANASTOMOSIS, rigid proctoscopy (N/A) LYSIS OF ADHESION (N/A) Mobilize, OOBTC Cr elev - Will hold Lasix, IVF bolus and increase IVF to 110/hr Await bowel function   LOS: 1 day    Marigene Ehlers., [facility] 10/22/2012

## 2012-10-23 ENCOUNTER — Encounter (HOSPITAL_COMMUNITY): Payer: Self-pay | Admitting: Surgery

## 2012-10-23 DIAGNOSIS — Z9889 Other specified postprocedural states: Secondary | ICD-10-CM

## 2012-10-23 LAB — CBC
HCT: 29.2 % — ABNORMAL LOW (ref 36.0–46.0)
MCH: 33.6 pg (ref 26.0–34.0)
MCHC: 34.9 g/dL (ref 30.0–36.0)
MCV: 96.1 fL (ref 78.0–100.0)
Platelets: 222 10*3/uL (ref 150–400)
RDW: 12.7 % (ref 11.5–15.5)

## 2012-10-23 LAB — BASIC METABOLIC PANEL
BUN: 20 mg/dL (ref 6–23)
Calcium: 8.7 mg/dL (ref 8.4–10.5)
Creatinine, Ser: 1.05 mg/dL (ref 0.50–1.10)
GFR calc Af Amer: 55 mL/min — ABNORMAL LOW (ref >90)
Potassium: 4.9 mEq/L (ref 3.5–5.1)

## 2012-10-23 MED ORDER — OXYCODONE-ACETAMINOPHEN 5-325 MG PO TABS
1.0000 | ORAL_TABLET | Freq: Four times a day (QID) | ORAL | Status: DC | PRN
  Administered 2012-10-23 – 2012-10-24 (×5): 1 via ORAL
  Filled 2012-10-23: qty 1
  Filled 2012-10-23: qty 2
  Filled 2012-10-23 (×4): qty 1

## 2012-10-23 MED ORDER — DEXTROSE-NACL 5-0.9 % IV SOLN
INTRAVENOUS | Status: DC
  Administered 2012-10-23 – 2012-10-24 (×2): via INTRAVENOUS

## 2012-10-23 NOTE — Progress Notes (Signed)
Physical Therapy Treatment Patient Details Name: [name] MRN: [id number] DOB: 12-Sep-1925 Today's Date: 10/23/2012 Time: 5784-6962 PT Time Calculation (min): 24 min  PT Assessment / Plan / Recommendation  History of Present Illness 79 year old female s/p LAPAROSCOPIC COLOSTOMY TAKEDOWN AND HARTMANS ANASTOMOSIS, rigid proctoscopy    PT Comments   Progressing with mobility. Continues to require Mod assist for bed mobility. Progressing with ambulation distance.   Follow Up Recommendations  SNF;Supervision/Assistance - 24 hour (unless pt will have assistance at home)     Does the patient have the potential to tolerate intense rehabilitation     Barriers to Discharge        Equipment Recommendations  None recommended by PT    Recommendations for Other Services    Frequency Min 3X/week   Progress towards PT Goals Progress towards PT goals: Progressing toward goals  Plan Current plan remains appropriate    Precautions / Restrictions Precautions Precautions: Fall Precaution Comments: JP drain Restrictions Weight Bearing Restrictions: No   Pertinent Vitals/Pain 8/10 abdomen. RN made aware    Mobility  Bed Mobility Bed Mobility: Sit to Supine Sit to Sidelying Right: 3: Mod assist Details for Bed Mobility Assistance: Assist for bil LEs onto bed. Increased time.  Transfers Transfers: Sit to Stand;Stand to Sit Sit to Stand: 4: Min guard;From chair/3-in-1;From toilet Stand to Sit: 4: Min guard;To bed;To toilet Details for Transfer Assistance: verbal cues for safe technique Ambulation/Gait Ambulation/Gait Assistance: 4: Min guard Ambulation Distance (Feet): 125 Feet Assistive device: Rolling walker Ambulation/Gait Assistance Details: VCs safety, distnce from RA. Dyspnea 3/4 with distance. Fatigues fairly easily.  Gait Pattern: Step-through pattern;Decreased stride length;Trunk flexed    Exercises     PT Diagnosis:    PT Problem List:   PT Treatment Interventions:      PT Goals (current goals can now be found in the care plan section)    Visit Information  Last PT Received On: 10/23/12 Assistance Needed: +1 History of Present Illness: 79 year old female s/p LAPAROSCOPIC COLOSTOMY TAKEDOWN AND HARTMANS ANASTOMOSIS, rigid proctoscopy     Subjective Data      Cognition  Cognition Arousal/Alertness: Awake/alert Behavior During Therapy: WFL for tasks assessed/performed Overall Cognitive Status: Within Functional Limits for tasks assessed    Balance     End of Session PT - End of Session Activity Tolerance: Patient limited by fatigue;Patient limited by pain Patient left: in bed;with call bell/phone within reach   GP     [name], MPT Pager: [phone]

## 2012-10-23 NOTE — Progress Notes (Signed)
2 Days Post-Op  Subjective: Yesterday's events noted.  Pt feeling better this AM and less sore.  Feels she may have passed some gas.  Objective: Vital signs in last 24 hours: Temp:  [97.4 F (36.3 C)-98.6 F (37 C)] 98 F (36.7 C) (10/23 0500) Pulse Rate:  [62-72] 72 (10/23 0500) Resp:  [15-18] 16 (10/23 0500) BP: (118-140)/(41-65) 126/62 mmHg (10/23 0500) SpO2:  [95 %-100 %] 95 % (10/23 0500) Last BM Date: 10/20/12  Intake/Output from previous day: 10/22 0701 - 10/23 0700 In: 7760.1 [I.V.:2368.4; IV Piggyback:5391.7] Out: 810 [Urine:700; Drains:110] Intake/Output this shift:    General appearance: alert and cooperative GI: soft, approp ttp, ND, active BS, wound c/d/i  Lab Results:   Recent Labs  10/22/12 0419 10/23/12 0400  WBC 12.5* 9.8  HGB 11.3* 10.2*  HCT 33.5* 29.2*  PLT 251 222   BMET  Recent Labs  10/22/12 0419 10/23/12 0400  NA 136 137  K 4.7 4.9  CL 102 108  CO2 26 23  GLUCOSE 144* 129*  BUN 22 20  CREATININE 1.32* 1.05  CALCIUM 8.9 8.7   PT/INR No results found for this basename: LABPROT, INR,  in the last 72 hours ABG No results found for this basename: PHART, PCO2, PO2, HCO3,  in the last 72 hours  Studies/Results: No results found.  Anti-infectives: Anti-infectives   Start     Dose/Rate Route Frequency Ordered Stop   10/21/12 2000  cefoTEtan (CEFOTAN) 2 g in dextrose 5 % 50 mL IVPB     2 g 100 mL/hr over 30 Minutes Intravenous Every 12 hours 10/21/12 1249 10/21/12 2202   10/21/12 0616  cefoTEtan (CEFOTAN) 2 g in dextrose 5 % 50 mL IVPB     2 g 100 mL/hr over 30 Minutes Intravenous On call to O.R. 10/21/12 4098 10/21/12 0738      Assessment/Plan: s/p Procedure(s): LAPAROSCOPIC COLOSTOMY TAKEDOWN AND HARTMANS ANASTOMOSIS, rigid proctoscopy (N/A) LYSIS OF ADHESION (N/A) Advance diet to Clears, OK to adv when having good bowel function PO pain Meds Con't PT Creat improving with IVF and holding lasix    LOS: 2 days     Marigene Ehlers., [facility] 10/23/2012

## 2012-10-23 NOTE — Progress Notes (Signed)
Dry dsg replaced this am to old colostomy site per Dr. Derrell Lolling.   Will [name] gave order approx 0930 to pack wound w/ ns wet to dry bid.  Wound packed w/ one ns dampened 4x4 to old colostomy site.  Minimal serosanguinous drng noted. Pt tolerated well

## 2012-10-24 LAB — URINALYSIS, ROUTINE W REFLEX MICROSCOPIC
Bilirubin Urine: NEGATIVE
Ketones, ur: NEGATIVE mg/dL
Nitrite: NEGATIVE
Protein, ur: NEGATIVE mg/dL
pH: 5.5 (ref 5.0–8.0)

## 2012-10-24 LAB — BASIC METABOLIC PANEL
BUN: 14 mg/dL (ref 6–23)
Calcium: 8.8 mg/dL (ref 8.4–10.5)
Chloride: 106 mEq/L (ref 96–112)
Creatinine, Ser: 1.01 mg/dL (ref 0.50–1.10)
GFR calc Af Amer: 57 mL/min — ABNORMAL LOW (ref >90)

## 2012-10-24 LAB — URINE MICROSCOPIC-ADD ON

## 2012-10-24 MED ORDER — IPRATROPIUM-ALBUTEROL 18-103 MCG/ACT IN AERO
2.0000 | INHALATION_SPRAY | Freq: Four times a day (QID) | RESPIRATORY_TRACT | Status: DC | PRN
  Filled 2012-10-24: qty 14.7

## 2012-10-24 MED ORDER — ACETAMINOPHEN 500 MG PO TABS
1000.0000 mg | ORAL_TABLET | Freq: Three times a day (TID) | ORAL | Status: DC
  Administered 2012-10-24 – 2012-10-27 (×10): 1000 mg via ORAL
  Filled 2012-10-24 (×11): qty 2

## 2012-10-24 MED ORDER — LACTATED RINGERS IV BOLUS (SEPSIS): 1000.0000 mL | Freq: Three times a day (TID) | INTRAVENOUS | Status: AC | PRN

## 2012-10-24 MED ORDER — HYDROMORPHONE HCL PF 1 MG/ML IJ SOLN: 0.5000 mg | INTRAMUSCULAR | Status: DC | PRN

## 2012-10-24 MED ORDER — IPRATROPIUM-ALBUTEROL 20-100 MCG/ACT IN AERS
2.0000 | INHALATION_SPRAY | Freq: Four times a day (QID) | RESPIRATORY_TRACT | Status: DC | PRN
  Filled 2012-10-24: qty 4

## 2012-10-24 MED ORDER — OXYCODONE HCL 5 MG PO TABS: 5.0000 mg | ORAL_TABLET | ORAL | Status: DC | PRN

## 2012-10-24 MED ORDER — OXYCODONE HCL 10 MG PO TABS: 5.0000 mg | ORAL_TABLET | Freq: Four times a day (QID) | ORAL | Status: DC | PRN

## 2012-10-24 MED ORDER — SACCHAROMYCES BOULARDII 250 MG PO CAPS
250.0000 mg | ORAL_CAPSULE | Freq: Two times a day (BID) | ORAL | Status: DC
  Administered 2012-10-24 – 2012-10-27 (×7): 250 mg via ORAL
  Filled 2012-10-24 (×8): qty 1

## 2012-10-24 MED ORDER — DIPHENHYDRAMINE HCL 50 MG/ML IJ SOLN: 12.5000 mg | Freq: Four times a day (QID) | INTRAMUSCULAR | Status: DC | PRN

## 2012-10-24 MED ORDER — PANTOPRAZOLE SODIUM 40 MG PO TBEC
40.0000 mg | DELAYED_RELEASE_TABLET | Freq: Every day | ORAL | Status: DC
  Administered 2012-10-24 – 2012-10-27 (×4): 40 mg via ORAL
  Filled 2012-10-24 (×4): qty 1

## 2012-10-24 MED ORDER — LIP MEDEX EX OINT
1.0000 "application " | TOPICAL_OINTMENT | Freq: Two times a day (BID) | CUTANEOUS | Status: DC
  Administered 2012-10-24 – 2012-10-27 (×6): 1 via TOPICAL
  Filled 2012-10-24 (×2): qty 7

## 2012-10-24 MED ORDER — MAGIC MOUTHWASH
15.0000 mL | Freq: Four times a day (QID) | ORAL | Status: DC | PRN
  Filled 2012-10-24: qty 15

## 2012-10-24 MED ORDER — OXYCODONE HCL 5 MG PO TABS
5.0000 mg | ORAL_TABLET | Freq: Four times a day (QID) | ORAL | Status: DC | PRN
  Administered 2012-10-25: 5 mg via ORAL
  Filled 2012-10-24 (×2): qty 1

## 2012-10-24 MED ORDER — ALUM & MAG HYDROXIDE-SIMETH 200-200-20 MG/5ML PO SUSP: 30.0000 mL | Freq: Four times a day (QID) | ORAL | Status: DC | PRN

## 2012-10-24 MED ORDER — SODIUM CHLORIDE 0.9 % IJ SOLN
3.0000 mL | INTRAMUSCULAR | Status: DC | PRN
Start: 2012-10-24 — End: 2012-10-27

## 2012-10-24 MED ORDER — ASPIRIN EC 81 MG PO TBEC
81.0000 mg | DELAYED_RELEASE_TABLET | Freq: Every morning | ORAL | Status: DC
  Administered 2012-10-24 – 2012-10-27 (×4): 81 mg via ORAL
  Filled 2012-10-24 (×5): qty 1

## 2012-10-24 MED ORDER — SODIUM CHLORIDE 0.9 % IJ SOLN
3.0000 mL | Freq: Two times a day (BID) | INTRAMUSCULAR | Status: DC
  Administered 2012-10-24 – 2012-10-27 (×7): 3 mL via INTRAVENOUS

## 2012-10-24 NOTE — Progress Notes (Addendum)
Pt complaining of burning frequency and urgency when urinated.  Output small amounts each time she voids.  Bladder scanned after last void of 50 cc dark urine.  UA obtained after speaking with Dr Derrell Lolling.

## 2012-10-24 NOTE — Care Management Note (Signed)
    Page 1 of 2   10/24/2012     3:30:00 PM   CARE MANAGEMENT NOTE 10/24/2012  Patient:  [name], [name]   Account Number:  [id number]  Date Initiated:  10/22/2012  Documentation initiated by:  DAVIS,TYMEEKA  Subjective/Objective Assessment:   79 yo female admitted s/p LAPAROSCOPIC COLOSTOMY TAKEDOWN AND HARTMANS ANASTOMOSIS, rigid proctoscopy (N/A)  LYSIS OF ADHESION (N/A)     Action/Plan:   Home when stable   Anticipated DC Date:  10/25/2012   Anticipated DC Plan:  HOME W HOME HEALTH SERVICES      DC Planning Services  CM consult      [facility] Choice  HOME HEALTH   Choice offered to / List presented to:  C-4 Adult Children   DME arranged  NA      DME agency  NA     HH arranged  HH-2 PT      HH agency  [facility]   Status of service:  Completed, signed off Medicare Important Message given?   (If response is "NO", the following Medicare IM given date fields will be blank) Date Medicare IM given:   Date Additional Medicare IM given:    Discharge Disposition:  HOME W HOME HEALTH SERVICES  Per UR Regulation:  Reviewed for med. necessity/level of care/duration of stay  If discussed at Long Length of Stay Meetings, dates discussed:    Comments:  10/22/12 0947 Roland Earl [phone] Chart reviewed for utilization of services. Post op day. No needs assessed at this time.

## 2012-10-24 NOTE — Progress Notes (Signed)
[name] [id number] 04/21/1925  CARE TEAM:  PCP: Kaleen Mask, MD  Outpatient Care Team: Patient Care Team: Kaleen Mask, MD as PCP - General (Family Medicine)  Inpatient Treatment Team: Treatment Team: Attending Provider: Axel Filler, MD; Technician: Lynden Ang, NT; Physical Therapist: Gershon Mussel, PT; Consulting Physician: Ardeth Sportsman, MD; Technician: Fredricka Bonine, NT; Technician: Cindie Crumbly, NT   Subjective:  She had tolerated breakfast and lunch today. She is mild nauseated after lunch.  No BM X 2 days per pt but 3 recorded yesterday. Passing gas.  She complaining of pain and tenderness at LLQ and LUQ.  Walked in hallway X 2 times today.  Objective:  Vital signs:  Filed Vitals:   10/23/12 2151 10/24/12 0500 10/24/12 0903 10/24/12 0929  BP: 127/70 125/61 125/61   Pulse: 66 68    Temp: 98.1 F (36.7 C) 98.7 F (37.1 C)    TempSrc: Oral Oral    Resp: 18 17    Height:      Weight:      SpO2: 99% 97%  99%    Last BM Date: 10/20/12  Intake/Output   Yesterday:  10/23 0701 - 10/24 0700 In: 2203.8 [P.O.:640; I.V.:1563.8] Out: 1025 [Urine:1000; Drains:25] This shift:  Total I/O In: 340 [P.O.:340] Out: 220 [Urine:200; Drains:20]  Bowel function:  Flatus: yes  BM: no  Drain: Serosanguinous scant  Physical Exam:  General: Pt awake/alert/oriented x4 in no acute distress Eyes: PERRL, normal EOM.  Sclera clear.  No icterus Neuro: CN II-XII intact w/o focal sensory/motor deficits. Lymph: No head/neck/groin lymphadenopathy Psych:  No delerium/psychosis/paranoia HENT: Normocephalic, Mucus membranes moist.  No thrush Neck: Supple, No tracheal deviation Chest: RRR. No chest wall pain w good excursion CV:  Pulses intact.  Regular rhythm MS: Normal AROM mjr joints.  No obvious deformity Abdomen: Soft.  Nondistended.  Mildly tender at LUQ and LLQ.  Ostomy wound set [facility].  No evidence of peritonitis.  No  incarcerated hernias. Ext:  SCDs BLE.  No mjr edema.  No cyanosis Skin: No petechiae / purpura   Problem List:   Principal Problem:   S/P colostomy takedown 10/21/2012 Active Problems:   COPD (chronic obstructive pulmonary disease)   Hypertension   Chronic combined systolic and diastolic congestive heart failure   Diverticulosis of colon   Assessment  [name]  79 y.o. female  3 Days Post-Op  Procedure(s): LAPAROSCOPIC COLOSTOMY TAKEDOWN AND ANASTOMOSIS rigid proctoscopy LYSIS OF ADHESIONS  Improving  Plan:  -adv diet per protocol -bowel regimen -HTN control -d/c drain at d/c home -VTE prophylaxis- SCDs, etc -mobilize as tolerate to help recovery  D/C patient from hospital when patient meets criteria (anticipate in 1-2 day(s)):  Tolerating oral intake well Ambulating in walkways Adequate pain control without IV medications Urinating  Having flatus  I updated the patient's status to her & the family.  Recommendations were made.  Questions were answered.  She & the family expressed understanding & appreciation.   Ardeth Sportsman, M.D., F.A.C.S. Gastrointestinal and Minimally Invasive Surgery [facility] [address] [place], [place] [address] [phone] Main / Paging   10/24/2012   Results:   Labs: Results for orders placed during the hospital encounter of 10/21/12 (from the past 48 hour(s))  CBC     Status: Abnormal   Collection Time    10/23/12  4:00 AM      Result Value Range   WBC 9.8  4.0 - 10.5 K/uL   RBC 3.04 (*) 3.87 - 5.11 MIL/uL   Hemoglobin 10.2 (*) 12.0 - 15.0 g/dL   HCT 16.1 (*) 09.6 - 04.5 %   MCV 96.1  78.0 - 100.0 fL   MCH 33.6  26.0 - 34.0 pg   MCHC 34.9  30.0 - 36.0 g/dL   RDW 40.9  81.1 - 91.4 %   Platelets 222  150 - 400 K/uL  BASIC METABOLIC PANEL     Status: Abnormal   Collection Time    10/23/12  4:00 AM      Result Value Range   Sodium 137  135 - 145 mEq/L   Potassium 4.9   3.5 - 5.1 mEq/L   Chloride 108  96 - 112 mEq/L   CO2 23  19 - 32 mEq/L   Glucose, Bld 129 (*) 70 - 99 mg/dL   BUN 20  6 - 23 mg/dL   Creatinine, Ser 7.82  0.50 - 1.10 mg/dL   Calcium 8.7  8.4 - 95.6 mg/dL   GFR calc non Af Amer 47 (*) >90 mL/min   GFR calc Af Amer 55 (*) >90 mL/min   Comment: (NOTE)     The eGFR has been calculated using the CKD EPI equation.     This calculation has not been validated in all clinical situations.     eGFR's persistently <90 mL/min signify possible Chronic Kidney     Disease.  BASIC METABOLIC PANEL     Status: Abnormal   Collection Time    10/24/12  3:35 AM      Result Value Range   Sodium 135  135 - 145 mEq/L   Potassium 4.5  3.5 - 5.1 mEq/L   Chloride 106  96 - 112 mEq/L   CO2 24  19 - 32 mEq/L   Glucose, Bld 116 (*) 70 - 99 mg/dL   BUN 14  6 - 23 mg/dL   Creatinine, Ser 2.13  0.50 - 1.10 mg/dL   Calcium 8.8  8.4 - 08.6 mg/dL   GFR calc non Af Amer 49 (*) >90 mL/min   GFR calc Af Amer 57 (*) >90 mL/min   Comment: (NOTE)     The eGFR has been calculated using the CKD EPI equation.     This calculation has not been validated in all clinical situations.     eGFR's persistently <90 mL/min signify possible Chronic Kidney     Disease.  URINALYSIS, ROUTINE W REFLEX MICROSCOPIC     Status: Abnormal   Collection Time    10/24/12  5:45 AM      Result Value Range   Color, Urine YELLOW  YELLOW   APPearance CLOUDY (*) CLEAR   Specific Gravity, Urine 1.021  1.005 - 1.030   pH 5.5  5.0 - 8.0   Glucose, UA NEGATIVE  NEGATIVE mg/dL   Hgb urine dipstick SMALL (*) NEGATIVE   Bilirubin Urine NEGATIVE  NEGATIVE   Ketones, ur NEGATIVE  NEGATIVE mg/dL   Protein, ur NEGATIVE  NEGATIVE mg/dL   Urobilinogen, UA 0.2  0.0 - 1.0 mg/dL   Nitrite NEGATIVE  NEGATIVE   Leukocytes, UA SMALL (*) NEGATIVE  URINE MICROSCOPIC-ADD ON     Status: Abnormal   Collection Time    10/24/12  5:45 AM      Result Value Range   Squamous Epithelial / LPF RARE  RARE   WBC, UA  3-6  <3 WBC/hpf   RBC / HPF 0-2  <  3 RBC/hpf   Bacteria, UA FEW (*) RARE   Casts HYALINE CASTS (*) NEGATIVE   Crystals URIC ACID CRYSTALS (*) NEGATIVE    Imaging / Studies: No results found.  Medications / Allergies: per chart  Antibiotics: Anti-infectives   Start     Dose/Rate Route Frequency Ordered Stop   10/21/12 2000  cefoTEtan (CEFOTAN) 2 g in dextrose 5 % 50 mL IVPB     2 g 100 mL/hr over 30 Minutes Intravenous Every 12 hours 10/21/12 1249 10/21/12 2202   10/21/12 0616  cefoTEtan (CEFOTAN) 2 g in dextrose 5 % 50 mL IVPB     2 g 100 mL/hr over 30 Minutes Intravenous On call to O.R. 10/21/12 1610 10/21/12 9604

## 2012-10-24 NOTE — Progress Notes (Addendum)
Physical Therapy Treatment Patient Details Name: [name] MRN: [id number] DOB: April 13, 1925 Today's Date: 10/24/2012 Time: 4098-1191 PT Time Calculation (min): 16 min  PT Assessment / Plan / Recommendation  History of Present Illness 79 year old female s/p LAPAROSCOPIC COLOSTOMY TAKEDOWN AND HARTMANS ANASTOMOSIS, rigid proctoscopy    PT Comments   Progressing with mobility. Still dyspneic with  Ambulation-3/4. Pt states plan is for d/c home with family assisting.   Follow Up Recommendations  Home health PT;Supervision for mobility/OOB (pt states family will assist her): 24 hour supervision/assist     Does the patient have the potential to tolerate intense rehabilitation     Barriers to Discharge        Equipment Recommendations  None recommended by PT    Recommendations for Other Services    Frequency Min 3X/week   Progress towards PT Goals Progress towards PT goals: Progressing toward goals  Plan Discharge plan needs to be updated    Precautions / Restrictions Precautions Precautions: Fall Precaution Comments: JP drain Restrictions Weight Bearing Restrictions: No   Pertinent Vitals/Pain No c/o pain    Mobility  Bed Mobility Bed Mobility: Not assessed Details for Bed Mobility Assistance: pt coming out of bathroom Transfers Transfers: Stand to Sit Stand to Sit: 5: Supervision;With armrests;To chair/3-in-1 Details for Transfer Assistance: verbal cues for safe technique Ambulation/Gait Ambulation/Gait Assistance: 4: Min guard Ambulation Distance (Feet): 185 Feet Assistive device: Rolling walker Ambulation/Gait Assistance Details: VCs safety, distance from RW. Dyspnea 3/4 with ambulation.  Gait Pattern: Trunk flexed;Step-through pattern    Exercises     PT Diagnosis:    PT Problem List:   PT Treatment Interventions:     PT Goals (current goals can now be found in the care plan section)    Visit Information  Last PT Received On: 10/24/12 Assistance Needed:  +1 History of Present Illness: 79 year old female s/p LAPAROSCOPIC COLOSTOMY TAKEDOWN AND HARTMANS ANASTOMOSIS, rigid proctoscopy     Subjective Data      Cognition  Cognition Arousal/Alertness: Awake/alert Behavior During Therapy: WFL for tasks assessed/performed Overall Cognitive Status: Within Functional Limits for tasks assessed    Balance     End of Session PT - End of Session Activity Tolerance: Patient tolerated treatment well Patient left: in chair;with call bell/phone within reach   GP     [name], MPT Pager: [phone]

## 2012-10-25 NOTE — Progress Notes (Signed)
Physical Therapy Treatment Patient Details Name: [name] MRN: [id number] DOB: 1925/09/01 Today's Date: 10/25/2012 Time: 4098-1191 PT Time Calculation (min): 30 min  PT Assessment / Plan / Recommendation  History of Present Illness 79 year old female s/p LAPAROSCOPIC COLOSTOMY TAKEDOWN AND HARTMANS ANASTOMOSIS, rigid proctoscopy    PT Comments   Pt continues very pleasant and motivated to return home.  Follow Up Recommendations  Home health PT;Supervision for mobility/OOB     Does the patient have the potential to tolerate intense rehabilitation     Barriers to Discharge        Equipment Recommendations  None recommended by PT    Recommendations for Other Services    Frequency Min 3X/week   Progress towards PT Goals Progress towards PT goals: Progressing toward goals  Plan Current plan remains appropriate    Precautions / Restrictions Precautions Precautions: Fall Precaution Comments: JP drain Restrictions Weight Bearing Restrictions: No   Pertinent Vitals/Pain Min c/o discomfort    Mobility  Bed Mobility Bed Mobility: Supine to Sit Supine to Sit: 4: Min assist Details for Bed Mobility Assistance: cues for correct log roll technique Transfers Transfers: Stand to Sit;Sit to Stand Sit to Stand: 4: Min guard;From toilet;From bed Stand to Sit: 4: Min guard;With armrests;To chair/3-in-1;To toilet Details for Transfer Assistance: verbal cues for safe technique Ambulation/Gait Ambulation/Gait Assistance: 4: Min guard Ambulation Distance (Feet): 500 Feet (and 25' to bathroom) Assistive device: Rolling walker Ambulation/Gait Assistance Details: cues for posture and position from RW Gait Pattern: Trunk flexed;Step-through pattern;Shuffle Gait velocity: decreased    Exercises     PT Diagnosis:    PT Problem List:   PT Treatment Interventions:     PT Goals (current goals can now be found in the care plan section) Acute Rehab PT Goals Patient Stated Goal: return  home if possible PT Goal Formulation: With patient Time For Goal Achievement: 11/05/12 Potential to Achieve Goals: Good  Visit Information  Last PT Received On: 10/25/12 Assistance Needed: +1 History of Present Illness: 79 year old female s/p LAPAROSCOPIC COLOSTOMY TAKEDOWN AND HARTMANS ANASTOMOSIS, rigid proctoscopy     Subjective Data  Patient Stated Goal: return home if possible   Cognition  Cognition Arousal/Alertness: Awake/alert Behavior During Therapy: WFL for tasks assessed/performed Overall Cognitive Status: Within Functional Limits for tasks assessed    Balance     End of Session PT - End of Session Activity Tolerance: Patient tolerated treatment well Patient left: in chair;with call bell/phone within reach Nurse Communication: Mobility status   GP     [name] 10/25/2012, 4:47 PM

## 2012-10-25 NOTE — Progress Notes (Signed)
4 Days Post-Op  Subjective: Feeling okay.  Walking with walker.  No BM.  Passing gas. Sitting up eating lunch. Objective: Vital signs in last 24 hours: Temp:  [97.6 F (36.4 C)-99.1 F (37.3 C)] 98.5 F (36.9 C) (10/25 1007) Pulse Rate:  [63-69] 63 (10/25 1007) Resp:  [14-18] 16 (10/25 1007) BP: (108-146)/(45-60) 122/54 mmHg (10/25 1007) SpO2:  [94 %-99 %] 99 % (10/25 1007) Last BM Date: 10/23/12  Intake/Output from previous day: 10/24 0701 - 10/25 0700 In: 977.8 [P.O.:580; I.V.:397.8] Out: 991 [Urine:951; Drains:40] Intake/Output this shift: Total I/O In: 243 [P.O.:240; I.V.:3] Out: 200 [Urine:200]  PE: General- In NAD Abdomen-soft, drain output serous, midline dressing dry.  Lab Results:   Recent Labs  10/23/12 0400  WBC 9.8  HGB 10.2*  HCT 29.2*  PLT 222   BMET  Recent Labs  10/23/12 0400 10/24/12 0335  NA 137 135  K 4.9 4.5  CL 108 106  CO2 23 24  GLUCOSE 129* 116*  BUN 20 14  CREATININE 1.05 1.01  CALCIUM 8.7 8.8   PT/INR No results found for this basename: LABPROT, INR,  in the last 72 hours Comprehensive Metabolic Panel:    Component Value Date/Time   NA 135 10/24/2012 0335   K 4.5 10/24/2012 0335   CL 106 10/24/2012 0335   CO2 24 10/24/2012 0335   BUN 14 10/24/2012 0335   CREATININE 1.01 10/24/2012 0335   CREATININE 0.81 10/16/2012 1231   GLUCOSE 116* 10/24/2012 0335   CALCIUM 8.8 10/24/2012 0335   AST 25 03/27/2012 0522   ALT 29 03/27/2012 0522   ALKPHOS 64 03/27/2012 0522   BILITOT 0.3 03/27/2012 0522   PROT 6.2 03/27/2012 0522   ALBUMIN 2.4* 03/27/2012 0522     Studies/Results: No results found.  Anti-infectives: Anti-infectives   Start     Dose/Rate Route Frequency Ordered Stop   10/21/12 2000  cefoTEtan (CEFOTAN) 2 g in dextrose 5 % 50 mL IVPB     2 g 100 mL/hr over 30 Minutes Intravenous Every 12 hours 10/21/12 1249 10/21/12 2202   10/21/12 0616  cefoTEtan (CEFOTAN) 2 g in dextrose 5 % 50 mL IVPB     2 g 100 mL/hr over 30  Minutes Intravenous On call to O.R. 10/21/12 0616 10/21/12 0738      Assessment Principal Problem:   S/P colostomy takedown 10/21/2012-progressing well Active Problems:   COPD (chronic obstructive pulmonary disease)   Hypertension   Chronic combined systolic and diastolic congestive heart failure   Diverticulosis of colon    LOS: 4 days   Plan: See how she tolerated solid diet today.   [name] 10/25/2012

## 2012-10-26 LAB — URINE CULTURE

## 2012-10-26 NOTE — Progress Notes (Signed)
5 Days Post-Op  Subjective: Bowels loose and difficult to control.  Tolerating diet. Objective: Vital signs in last 24 hours: Temp:  [97.8 F (36.6 C)-98.5 F (36.9 C)] 98.5 F (36.9 C) (10/26 0500) Pulse Rate:  [63-70] 70 (10/26 0500) Resp:  [16-18] 18 (10/26 0500) BP: (122-157)/(48-74) 157/74 mmHg (10/26 0500) SpO2:  [94 %-100 %] 97 % (10/26 0746) Last BM Date: 10/26/12  Intake/Output from previous day: 10/25 0701 - 10/26 0700 In: 483 [P.O.:480; I.V.:3] Out: 1685 [Urine:1650; Drains:35] Intake/Output this shift: Total I/O In: 240 [P.O.:240] Out: 150 [Urine:150]  PE: General- In NAD Abdomen-soft, drain output serous, midline dressing dry, colostomy wound open and clean  Lab Results:  No results found for this basename: WBC, HGB, HCT, PLT,  in the last 72 hours BMET  Recent Labs  10/24/12 0335  NA 135  K 4.5  CL 106  CO2 24  GLUCOSE 116*  BUN 14  CREATININE 1.01  CALCIUM 8.8   PT/INR No results found for this basename: LABPROT, INR,  in the last 72 hours Comprehensive Metabolic Panel:    Component Value Date/Time   NA 135 10/24/2012 0335   K 4.5 10/24/2012 0335   CL 106 10/24/2012 0335   CO2 24 10/24/2012 0335   BUN 14 10/24/2012 0335   CREATININE 1.01 10/24/2012 0335   CREATININE 0.81 10/16/2012 1231   GLUCOSE 116* 10/24/2012 0335   CALCIUM 8.8 10/24/2012 0335   AST 25 03/27/2012 0522   ALT 29 03/27/2012 0522   ALKPHOS 64 03/27/2012 0522   BILITOT 0.3 03/27/2012 0522   PROT 6.2 03/27/2012 0522   ALBUMIN 2.4* 03/27/2012 0522     Studies/Results: No results found.  Anti-infectives: Anti-infectives   Start     Dose/Rate Route Frequency Ordered Stop   10/21/12 2000  cefoTEtan (CEFOTAN) 2 g in dextrose 5 % 50 mL IVPB     2 g 100 mL/hr over 30 Minutes Intravenous Every 12 hours 10/21/12 1249 10/21/12 2202   10/21/12 0616  cefoTEtan (CEFOTAN) 2 g in dextrose 5 % 50 mL IVPB     2 g 100 mL/hr over 30 Minutes Intravenous On call to O.R. 10/21/12 0616  10/21/12 0738      Assessment Principal Problem:   S/P colostomy takedown 10/21/2012-bowel function has returned but she is having trouble controlling her bowels. Active Problems:   COPD (chronic obstructive pulmonary disease)   Hypertension   Chronic combined systolic and diastolic congestive heart failure   Diverticulosis of colon    LOS: 5 days   Plan: Still a little deconditioned and not quite ready for discharge today.  Will need home health.   Cheyne Bungert J 10/26/2012

## 2012-10-27 MED ORDER — OXYCODONE-ACETAMINOPHEN 5-325 MG PO TABS: 1.0000 | ORAL_TABLET | ORAL | Status: DC | PRN

## 2012-10-27 NOTE — Discharge Summary (Signed)
Physician Discharge Summary  Patient ID: [name] MRN: [id number] DOB/AGE: 1925-09-14 79 y.o.  Admit date: 10/21/2012 Discharge date: 10/27/2012  Admission Diagnoses:status post Hartman's reversal of colostomy takedown 10/21/2012  Discharge Diagnoses:  Principal Problem:   S/P colostomy takedown 10/21/2012 Active Problems:   COPD (chronic obstructive pulmonary disease)   Hypertension   Chronic combined systolic and diastolic congestive heart failure   Diverticulosis of colon   Discharged Condition: good  Hospital Course: the patient is an 79 year old female with previous history of Hartmann secondary perforated diverticulitis. Patient underwent Hartman's reversal on 10/21/2012. Please see operative note for full details. Postoperative patient was taken to the floor. Her bowel function was monitored. She was started on a liquid diet. She is advanced to regular diet with the patient was able to tolerate well. She was having normal bowel function. The patient also had good pain control and was ambulating in the [place] on her own. The patient was otherwise afebrile was deemed stable for discharge and discharged home.  Consults: None  Significant Diagnostic Studies: none  Treatments: surgery: 10/21/2012  Discharge Exam: Blood pressure 146/75, pulse 58, temperature 98 F (36.7 C), temperature source Oral, resp. rate 16, height 5' (1.524 m), weight 154 lb (69.854 kg), SpO2 95.00%. General appearance: alert and cooperative Cardio: regular rate and rhythm, S1, S2 normal, no murmur, click, rub or gallop GI: soft, non-tender; bowel sounds normal; no masses,  no organomegaly, wound clean dry and intact  Disposition: 03-Skilled Nursing Facility  Discharge Orders   Future Appointments Provider Department Dept Phone   10/30/2012 11:00 AM Axel Filler, MD [facility] Surgery, [place] [phone]   Future Orders Complete By Expires   Call MD for:  extreme fatigue  As directed    Call  MD for:  hives  As directed    Call MD for:  persistant nausea and vomiting  As directed    Call MD for:  redness, tenderness, or signs of infection (pain, swelling, redness, odor or green/yellow discharge around incision site)  As directed    Call MD for:  severe uncontrolled pain  As directed    Call MD for:  As directed    Comments:     Temperature > 101.8F   Diet - low sodium heart healthy  As directed    Discharge instructions  As directed    Comments:     Please see discharge instruction sheets.  Also refer to handout given an office.  Please call our office if you have any questions or concerns [phone]   Discharge patient  As directed    Discharge wound care:  As directed    Comments:     If you have closed incisions, shower and bathe over these incisions with soap and water every day.  Remove all surgical dressings on postoperative day #3.  You do not need to replace dressings over the closed incisions unless you feel more comfortable with a Band-Aid covering it.   If you have an open wound that requires packing, please see wound care instructions.  In general, remove all dressings, wash wound with soap and water and then replace with saline moistened gauze.  Do the dressing change at least every day.  Please call our office [phone] if you have further questions.   Driving Restrictions  As directed    Comments:     No driving until off narcotics and can safely swerve away without pain during an emergency   Increase activity slowly  As directed    Comments:     Walk an hour a day.  Use 20-30 minute walks.  When you can walk 30 minutes without difficulty, increase to low impact/moderate activities such as biking, jogging, swimming, sexual activity..  Eventually can increase to unrestricted activity when not feeling pain.  If you feel pain: STOP!Marland Kitchen   Let pain protect you from overdoing it.  Use ice/heat/over-the-counter pain medications to help minimize his soreness.  Use pain  prescriptions as needed to remain active.  It is better to take extra pain medications and be more active than to stay bedridden to avoid all pain medications.   Lifting restrictions  As directed    Comments:     Avoid heavy lifting initially.  Do not push through pain.  You have no specific weight limit.  Coughing and sneezing or four more stressful to your incision than any lifting you will do. Pain will protect you from injury.  Therefore, avoid intense activity until off all narcotic pain medications.  Coughing and sneezing or four more stressful to your incision than any lifting he will do.   May shower / Bathe  As directed    May walk up steps  As directed    Sexual Activity Restrictions  As directed    Comments:     Sexual activity as tolerated.  Do not push through pain.  Pain will protect you from injury.   Walk with assistance  As directed    Comments:     Walk over an hour a day.  May use a walker/cane/companion to help with balance and stamina.       Medication List    STOP taking these medications       SUPREP BOWEL PREP Soln  Generic drug:  Na Sulfate-K Sulfate-Mg Sulf      TAKE these medications       albuterol 108 (90 BASE) MCG/ACT inhaler  Commonly known as:  PROVENTIL HFA;VENTOLIN HFA  Inhale 2 puffs into the lungs every 6 (six) hours as needed for wheezing.     albuterol-ipratropium 18-103 MCG/ACT inhaler  Commonly known as:  COMBIVENT  Inhale 2 puffs into the lungs every 6 (six) hours as needed for wheezing. For shortness of breath     aspirin EC 81 MG tablet  Take 81 mg by mouth every morning.     budesonide-formoterol 160-4.5 MCG/ACT inhaler  Commonly known as:  SYMBICORT  Inhale 2 puffs into the lungs 2 (two) times daily.     furosemide 40 MG tablet  Commonly known as:  LASIX  Take 40 mg by mouth every morning.     lisinopril 20 MG tablet  Commonly known as:  PRINIVIL,ZESTRIL  Take 10 mg by mouth every morning.     Oxycodone HCl 10 MG Tabs   Take 0.5-1 tablets (5-10 mg total) by mouth every 6 (six) hours as needed (pain).     oxyCODONE-acetaminophen 5-325 MG per tablet  Commonly known as:  ROXICET  Take 1 tablet by mouth every 4 (four) hours as needed for pain.     Vitamin D (Ergocalciferol) 50000 UNITS Caps capsule  Commonly known as:  DRISDOL  Take 50,000 Units by mouth every 14 (fourteen) days. Sunday           Follow-up Information   Follow up with Marigene Ehlers., Jed Limerick, MD. Schedule an appointment as soon as possible for a visit in 2 weeks.   Specialty:  General Surgery   Contact information:  [address] [place] [address] [phone]       Signed: Marigene Ehlers., Jed Limerick 10/27/2012, 3:08 PM

## 2012-10-27 NOTE — Progress Notes (Signed)
Physical Therapy Treatment Patient Details Name: [name] MRN: [id number] DOB: [birth year] Today's Date: 10/27/2012 Time: 9811-9147 PT Time Calculation (min): 23 min  PT Assessment / Plan / Recommendation  History of Present Illness 79 year old female s/p LAPAROSCOPIC COLOSTOMY TAKEDOWN AND HARTMANS ANASTOMOSIS, rigid proctoscopy    PT Comments   Progressing well with mobility. Plan is for possible d/c home later today.  Follow Up Recommendations  Home health PT;Supervision for mobility/OOB     Does the patient have the potential to tolerate intense rehabilitation     Barriers to Discharge        Equipment Recommendations  None recommended by PT    Recommendations for Other Services    Frequency Min 3X/week   Progress towards PT Goals Progress towards PT goals: Progressing toward goals  Plan Current plan remains appropriate    Precautions / Restrictions Precautions Precautions: Fall Precaution Comments: JP drain Restrictions Weight Bearing Restrictions: No   Pertinent Vitals/Pain Pt denied pain    Mobility  Bed Mobility Bed Mobility: Supine to Sit Supine to Sit: 4: Min guard Transfers Transfers: Sit to Stand;Stand to Sit Sit to Stand: 5: Supervision;From bed;From toilet Stand to Sit: 5: Supervision;To toilet;To chair/3-in-1 Details for Transfer Assistance: verbal cues for safe technique Ambulation/Gait Ambulation/Gait Assistance: 4: Min guard Ambulation Distance (Feet): 500 Feet Ambulation/Gait Assistance Details: close guard for safety    Exercises     PT Diagnosis:    PT Problem List:   PT Treatment Interventions:     PT Goals (current goals can now be found in the care plan section)    Visit Information  Last PT Received On: 10/27/12 Assistance Needed: +1 History of Present Illness: 79 year old female s/p LAPAROSCOPIC COLOSTOMY TAKEDOWN AND HARTMANS ANASTOMOSIS, rigid proctoscopy     Subjective Data      Cognition   Cognition Arousal/Alertness: Awake/alert Behavior During Therapy: WFL for tasks assessed/performed Overall Cognitive Status: Within Functional Limits for tasks assessed    Balance     End of Session PT - End of Session Activity Tolerance: Patient tolerated treatment well Patient left: in chair;with call bell/phone within reach;with nursing/sitter in room   GP     [name], MPT Pager: [phone]

## 2012-10-30 ENCOUNTER — Encounter (INDEPENDENT_AMBULATORY_CARE_PROVIDER_SITE_OTHER): Payer: Self-pay | Admitting: General Surgery

## 2012-10-30 ENCOUNTER — Ambulatory Visit (INDEPENDENT_AMBULATORY_CARE_PROVIDER_SITE_OTHER): Payer: Medicare Other | Admitting: General Surgery

## 2012-10-30 VITALS — BP 170/102 | HR 80 | Temp 98.4°F | Resp 16 | Ht 61.0 in | Wt 158.2 lb

## 2012-10-30 DIAGNOSIS — Z9889 Other specified postprocedural states: Secondary | ICD-10-CM

## 2012-10-30 MED ORDER — SULFAMETHOXAZOLE-TRIMETHOPRIM 400-80 MG PO TABS: 1.0000 | ORAL_TABLET | Freq: Two times a day (BID) | ORAL | Status: AC

## 2012-10-30 NOTE — Progress Notes (Signed)
Patient ID: [name], female   DOB: [birth year], 79 y.o.   MRN: [id number] Post op course Patient has been doing well postoperatively. She status post Hartman's reversal. She's had normal bowel function at this time. She is having a normall diet and tolerating that well.  She's a minimal pain at this time.  She does state she's having some anxiety/urgency or earning with urination.  On Exam: Her laparoscopic wounds are clean dry and intact. Her previous ostomy site has some minimal erythema. There is no drainage. Her old JP site drainhas some serosanguineous  drainage.   Assessment and Plan 79 year old female status post Hartmann's reversal 1. Currently the patient prescription for antibiotics at this time secondary to potential infection near her ostomy site. 2. We'll obtain UA for possible UTI. 3. We'll have the patient follow back up in a month.   Axel Filler, MD [facility] [place] General & Minimally Invasive Surgery Trauma & Emergency Surgery

## 2012-10-31 ENCOUNTER — Telehealth (INDEPENDENT_AMBULATORY_CARE_PROVIDER_SITE_OTHER): Payer: Self-pay

## 2012-10-31 LAB — URINALYSIS
Ketones, ur: NEGATIVE mg/dL
Nitrite: NEGATIVE
Protein, ur: 30 mg/dL — AB
Specific Gravity, Urine: 1.022 (ref 1.005–1.030)
Urobilinogen, UA: 1 mg/dL (ref 0.0–1.0)
pH: 6.5 (ref 5.0–8.0)

## 2012-10-31 NOTE — Telephone Encounter (Signed)
The home visit NP called about the pt.  She was just seen here today.  Her bandages in the lower abdomen are saturated.  She has some redness at the ostomy site which was noted yesterday.  She has no fever.  She started her antibiotics.   I asked her to remove and replace the gauze and tape.  Apply plenty of dry gauze.  Monitor it today.  It could be a seroma.  If the bandage gets saturated again let us know by the end of the day and she needs to get checked today.  She agrees to the plan and will let us know if she needs anything else.

## 2012-11-03 ENCOUNTER — Telehealth (INDEPENDENT_AMBULATORY_CARE_PROVIDER_SITE_OTHER): Payer: Self-pay

## 2012-11-03 NOTE — Telephone Encounter (Signed)
Called patient to make aware that urinalysis results are negative for infection.  Patient reports she's had improvement in urgency. Patient advised to contact PCP if  Urgency continues for further work-up.  Patient denies having any fevers, abdominal pains.  Patient reports she's eating well and having bowel movements without difficulty.  Patient states she's having light pinkish colored drainage from incision.  Patient will keep dry gauze over incision and call our office if she has an increase in drainage, fever or abdominal pain.  Patient verbalized understanding and agrees with plan as above.

## 2012-11-03 NOTE — Telephone Encounter (Signed)
Message copied by Maryan Puls on Mon Nov 03, 2012 10:32 AM ------      Message from: Axel Filler      Created: Mon Nov 03, 2012  7:11 AM       Can you call her and let her know her urine had no infection in it.  If she con't with urgency she may need to be seen by her PCP.  She had a catheter in surgery and may have caused some irritation, so it may also be that.            Thanks      AR      ----- Message -----         From: [facility]         Sent: 10/31/2012   8:05 AM           To: Axel Filler, MD                   ------

## 2012-11-04 NOTE — Telephone Encounter (Signed)
Pt called today with update of her abd wd. She states still having small amount of clear to reddish drainage from colostomy closure site. No fever,redness,swelling or foul drainage. No abd pain. Pt states she is keeping a dry gauze on top of area and changing it when needed. Pt states her daughter is with her and also has looked at area. Pt just wanted Christy to know she will watch area and call to be seen if drainage changes or above symptoms occur. I advised pt I will sent this note to [facility] and Dr Derrell Lolling.

## 2012-11-24 ENCOUNTER — Encounter: Payer: Self-pay | Admitting: Cardiovascular Disease

## 2012-12-02 ENCOUNTER — Encounter (INDEPENDENT_AMBULATORY_CARE_PROVIDER_SITE_OTHER): Payer: Medicare Other | Admitting: General Surgery

## 2012-12-04 ENCOUNTER — Encounter (INDEPENDENT_AMBULATORY_CARE_PROVIDER_SITE_OTHER): Payer: Self-pay | Admitting: General Surgery

## 2012-12-04 ENCOUNTER — Ambulatory Visit (INDEPENDENT_AMBULATORY_CARE_PROVIDER_SITE_OTHER): Payer: Medicare Other | Admitting: General Surgery

## 2012-12-04 ENCOUNTER — Encounter (INDEPENDENT_AMBULATORY_CARE_PROVIDER_SITE_OTHER): Payer: Self-pay

## 2012-12-04 VITALS — BP 150/80 | HR 60 | Temp 97.6°F | Resp 18 | Ht 61.0 in | Wt 151.0 lb

## 2012-12-04 DIAGNOSIS — Z9889 Other specified postprocedural states: Secondary | ICD-10-CM

## 2012-12-04 NOTE — Progress Notes (Signed)
Patient ID: [name], female   DOB: [birth year], 79 y.o.   MRN: [id number] Post op course The patient has been doing well since her last visit. She is tolerating a normal diet and having good bowel function. Her ostomy site has healed and no longer has any drainage.  On Exam: Her ostomy site is clean dry and intact.  Assessment and Plan A 79 year old female status post ostomy reversal 1. Patient can resume a normal diet. 2. We'll remove her stitches. 3. Patient to follow up at the   Axel Filler, MD [facility] Surgery, [place] General & Minimally Invasive Surgery Trauma & Emergency Surgery

## 2013-03-23 ENCOUNTER — Other Ambulatory Visit: Payer: Self-pay | Admitting: Family Medicine

## 2013-03-23 DIAGNOSIS — R1909 Other intra-abdominal and pelvic swelling, mass and lump: Secondary | ICD-10-CM

## 2013-03-24 ENCOUNTER — Inpatient Hospital Stay: Admission: RE | Admit: 2013-03-24 | Payer: Medicare Other | Source: Ambulatory Visit

## 2013-03-30 ENCOUNTER — Ambulatory Visit
Admission: RE | Admit: 2013-03-30 | Discharge: 2013-03-30 | Disposition: A | Discharge status: Home / Self Care | Payer: Medicare Other | Source: Ambulatory Visit | Attending: Family Medicine | Admitting: Family Medicine

## 2013-03-30 DIAGNOSIS — R1909 Other intra-abdominal and pelvic swelling, mass and lump: Secondary | ICD-10-CM

## 2013-03-30 MED ORDER — IOHEXOL 300 MG/ML  SOLN
100.0000 mL | Freq: Once | INTRAMUSCULAR | Status: AC | PRN
  Administered 2013-03-30: 100 mL via INTRAVENOUS

## 2013-04-01 ENCOUNTER — Ambulatory Visit (INDEPENDENT_AMBULATORY_CARE_PROVIDER_SITE_OTHER): Payer: Medicare Other | Admitting: Cardiovascular Disease

## 2013-04-01 ENCOUNTER — Encounter: Payer: Self-pay | Admitting: Cardiovascular Disease

## 2013-04-01 VITALS — BP 150/70 | HR 64 | Ht 61.0 in | Wt 152.8 lb

## 2013-04-01 DIAGNOSIS — I10 Essential (primary) hypertension: Secondary | ICD-10-CM

## 2013-04-01 DIAGNOSIS — I509 Heart failure, unspecified: Secondary | ICD-10-CM

## 2013-04-01 LAB — HEPATIC FUNCTION PANEL
ALK PHOS: 60 U/L (ref 39–117)
ALT: 13 U/L (ref 0–35)
AST: 17 U/L (ref 0–37)
Albumin: 3.7 g/dL (ref 3.5–5.2)
BILIRUBIN TOTAL: 1 mg/dL (ref 0.3–1.2)
Bilirubin, Direct: 0 mg/dL (ref 0.0–0.3)
Total Protein: 6.7 g/dL (ref 6.0–8.3)

## 2013-04-01 LAB — LIPID PANEL
CHOL/HDL RATIO: 5
Cholesterol: 169 mg/dL (ref 0–200)
HDL: 34.9 mg/dL — ABNORMAL LOW (ref >39.00)
LDL Cholesterol: 104 mg/dL — ABNORMAL HIGH (ref 0–99)
Triglycerides: 150 mg/dL — ABNORMAL HIGH (ref 0.0–149.0)
VLDL: 30 mg/dL (ref 0.0–40.0)

## 2013-04-01 LAB — BASIC METABOLIC PANEL
BUN: 16 mg/dL (ref 6–23)
CALCIUM: 9 mg/dL (ref 8.4–10.5)
CO2: 27 meq/L (ref 19–32)
CREATININE: 0.9 mg/dL (ref 0.4–1.2)
Chloride: 104 mEq/L (ref 96–112)
GFR: 81.63 mL/min (ref >60.00)
GLUCOSE: 102 mg/dL — AB (ref 70–99)
Potassium: 4 mEq/L (ref 3.5–5.1)
Sodium: 139 mEq/L (ref 135–145)

## 2013-04-01 NOTE — Progress Notes (Signed)
Macy Mis Date of Birth  January 28, 1925       [facility] [address], [address], [place], [place]  [address]   [place], [place]  [address] [phone]     [phone]   Fax  [phone]    Fax [phone]  Problem List: 1. HTN 2. CHF 3. Hypothyroidism 4. diverticulitis  History of Present Illness:  Ms. Zabawa is an 80 yo with hx diverticulitis with diverticulosis in April. She had a perforated diverticulum and became septic. She had emergent surgery with a colostomy placement. She now scheduled to have reversal of her colostomy and the surgeons are requesting cardiac clearance. When she was hospitalized she had an echocardiogram that revealed mildly depressed left ventricular systolic function with an ejection fraction of 40-45%. She did have some diastolic dysfunction.  Left ventricle: The cavity size was moderately dilated. Wall thickness was normal. Systolic function was mildly to moderately reduced. The estimated ejection fraction was in the range of 40% to 45%. There was an increased relative contribution of atrial contraction to ventricular filling. - Left atrium: The atrium was mildly dilated  He has not had any cardiac problems since I last saw her 7 years ago.   She does all of her housework without any significant problems. She's able to calm 1 flight of stairs without difficulty. She thinks that she would become short of breath if I asked her to climb 2 flights of stairs.  April, 2015:   She is doing well.  She tolerated her colostomy reversal last year without problems.   Able to do all of her normal activities without any chest pain or shortness breath. She still able to go up steps although she comments that she climbs  slowly.  Current Outpatient Prescriptions on File Prior to Visit  Medication Sig Dispense Refill  . albuterol (PROVENTIL HFA;VENTOLIN HFA) 108 (90 BASE) MCG/ACT inhaler Inhale 2 puffs  into the lungs every 6 (six) hours as needed for wheezing.      Marland Kitchen aspirin EC 81 MG tablet Take 81 mg by mouth every morning.      . furosemide (LASIX) 40 MG tablet Take 40 mg by mouth every morning.       Marland Kitchen lisinopril (PRINIVIL,ZESTRIL) 20 MG tablet Take 10 mg by mouth every morning.       . Vitamin D, Ergocalciferol, (DRISDOL) 50000 UNITS CAPS Take 50,000 Units by mouth every 14 (fourteen) days. Sunday       No current facility-administered medications on file prior to visit.    Allergies  Allergen Reactions  . Indomethacin Anaphylaxis and Other (See Comments)    "took it fine for awhile; one day I stopped breathing and I ended up in the hospital" (01/02/2012)    Past Medical History  Diagnosis Date  . Hypertension   . Goiter     radioactive iodine ablation/notes 01/02/2012  . COPD (chronic obstructive pulmonary disease)   . CHF (congestive heart failure)   . SOB (shortness of breath)     'all the time" (01/02/2012)  . Hypothyroidism     "I have taken Synthroid before" (01/02/2012)  . Arthritis     "right shoulder" (01/02/2012)  . Diverticulitis   . H/O colostomy   . Pneumonia 01/02/2012; ? 2009  . GERD (gastroesophageal reflux disease)     occasional  . Anemia     years ago  . Diverticulitis of colon with  perforation 01/14/2012    That is post sigmoid resection with [facility] pouch and end colostomy   . CAP (community acquired pneumonia) 01/02/2012    Lower lobe   . Intra-abdominal abscess 01/14/2012  . Pneumatosis of intestines 01/14/2012  . UTI (lower urinary tract infection) 01/03/2012    Past Surgical History  Procedure Laterality Date  . Cataract extraction w/ intraocular lens  implant, bilateral  ?1990's    Bil  . Appendectomy  1980's    "when they did hysterectomy" (01/02/2012)  . Abdominal hysterectomy  1980's  . Colostomy revision N/A 03/12/2012    Procedure: COLON RESECTION SIGMOID ;  Surgeon: Ralene Ok, MD;  Location: [facility];  Service: General;  Laterality: N/A;  .  Colostomy N/A 03/12/2012    Procedure: COLOSTOMY;  Surgeon: Ralene Ok, MD;  Location: [facility];  Service: General;  Laterality: N/A;  . Colostomy takedown N/A 10/21/2012    Procedure: LAPAROSCOPIC COLOSTOMY TAKEDOWN AND HARTMANS ANASTOMOSIS, rigid proctoscopy;  Surgeon: Ralene Ok, MD;  Location: [facility];  Service: General;  Laterality: N/A;  . Lysis of adhesion N/A 10/21/2012    Procedure: LYSIS OF ADHESION;  Surgeon: Ralene Ok, MD;  Location: [facility];  Service: General;  Laterality: N/A;    History  Smoking status  . Former Smoker -- 0.12 packs/day for 35 years  . Types: Cigarettes  . Quit date: 01/02/1991  Smokeless tobacco  . Never Used    History  Alcohol Use No    Family History  Problem Relation Age of Onset  . Heart disease    . Cancer Sister     Breast    Reviw of Systems:  Reviewed in the HPI.  All other systems are negative.  Physical Exam: Blood pressure 150/70, pulse 64, height 5\' 1"  (1.549 m), weight 152 lb 12.8 oz (69.31 kg). General: Well developed, well nourished, in no acute distress.  Head: Normocephalic, atraumatic, sclera non-icteric, mucus membranes are moist,   Neck: Supple. Carotids are 2 + without bruits. No JVD   Lungs: Clear   Heart: RR, soft murmur  Abdomen: Soft, non-tender, non-distended with normal bowel sounds. + colostomy bag present  Msk:  Strength and tone are normal   Extremities: No clubbing or cyanosis. No edema.  Distal pedal pulses are 2+ and equal    Neuro: CN II - XII intact.  Alert and oriented X 3.   Psych:  Normal   ECG: Sept. 4, 2014:  Sinus brady.  ST /T abnormality.  No significant changes from previous   Assessment / Plan:

## 2013-04-01 NOTE — Assessment & Plan Note (Signed)
BP is usually ok.  Cont. meds

## 2013-04-01 NOTE — Patient Instructions (Signed)
Your physician recommends that you return for lab work in: BMET [place] wants you to follow-up in:6 MONTHS  You will receive a reminder letter in the mail two months in advance. If you don't receive a letter, please call our office to schedule the follow-up appointment.  Your physician recommends that you continue on your current medications as directed. Please refer to the Current Medication list given to you today.

## 2013-04-01 NOTE — Assessment & Plan Note (Signed)
[name] is doing well.  She has not had any chest pain or dyspnea.  Continue with current meds.  Will check labs today.

## 2013-04-10 ENCOUNTER — Other Ambulatory Visit: Payer: Self-pay | Admitting: Family Medicine

## 2013-04-10 DIAGNOSIS — R9389 Abnormal findings on diagnostic imaging of other specified body structures: Secondary | ICD-10-CM

## 2013-04-13 ENCOUNTER — Ambulatory Visit
Admission: RE | Admit: 2013-04-13 | Discharge: 2013-04-13 | Disposition: A | Discharge status: Home / Self Care | Payer: Medicare Other | Source: Ambulatory Visit | Attending: Family Medicine | Admitting: Family Medicine

## 2013-04-13 DIAGNOSIS — R9389 Abnormal findings on diagnostic imaging of other specified body structures: Secondary | ICD-10-CM

## 2013-05-12 ENCOUNTER — Other Ambulatory Visit: Payer: Self-pay | Admitting: Urology

## 2013-05-12 DIAGNOSIS — R1900 Intra-abdominal and pelvic swelling, mass and lump, unspecified site: Secondary | ICD-10-CM

## 2013-05-21 ENCOUNTER — Ambulatory Visit (HOSPITAL_COMMUNITY)
Admission: RE | Admit: 2013-05-21 | Discharge: 2013-05-21 | Disposition: A | Discharge status: Home / Self Care | Payer: Medicare Other | Source: Ambulatory Visit | Attending: Urology | Admitting: Urology

## 2013-05-21 DIAGNOSIS — R1900 Intra-abdominal and pelvic swelling, mass and lump, unspecified site: Secondary | ICD-10-CM

## 2013-05-21 DIAGNOSIS — N3289 Other specified disorders of bladder: Secondary | ICD-10-CM | POA: Insufficient documentation

## 2013-05-21 DIAGNOSIS — N9489 Other specified conditions associated with female genital organs and menstrual cycle: Secondary | ICD-10-CM | POA: Insufficient documentation

## 2013-05-21 LAB — POCT I-STAT CREATININE: Creatinine, Ser: 0.9 mg/dL (ref 0.50–1.10)

## 2013-05-21 MED ORDER — GADOBENATE DIMEGLUMINE 529 MG/ML IV SOLN
15.0000 mL | Freq: Once | INTRAVENOUS | Status: AC | PRN
  Administered 2013-05-21: 14 mL via INTRAVENOUS

## 2013-06-17 ENCOUNTER — Telehealth: Payer: Self-pay | Admitting: Cardiovascular Disease

## 2013-06-17 NOTE — Telephone Encounter (Signed)
New Prob    Dr. Arelia Sneddon is calling to verify clearance for this pt. Hoping to do a biopsy (has not yet been scheduled). States he sent a fax and is following up.

## 2013-06-17 NOTE — Telephone Encounter (Signed)
Dr. Arelia Sneddon nurse called to verify surgical clearance for pt . Pt  is having a bladder biopsy . The procedure will be schedules after clearance. Dr. Arelia Sneddon  is aware that Dr. Elmarie Shiley nurse will be in 06/22/13 she probable knows about the fax sent from them.

## 2013-06-22 NOTE — Telephone Encounter (Signed)
Clearance placed in HIM for faxing

## 2013-06-23 ENCOUNTER — Other Ambulatory Visit: Payer: Self-pay | Admitting: Urology

## 2013-06-23 ENCOUNTER — Telehealth: Payer: Self-pay | Admitting: Cardiovascular Disease

## 2013-06-23 NOTE — Telephone Encounter (Signed)
Received request from Nurse fax box, documents faxed for surgical clearance. To: [facility] Fax number: [phone] Attention: 6.23.15/km

## 2013-06-23 NOTE — Progress Notes (Signed)
Please put orders in EPIC as patient has pre-op appointment on 06/24/2013 at 930 am! Thank you!

## 2013-06-24 ENCOUNTER — Encounter (HOSPITAL_COMMUNITY): Payer: Self-pay | Admitting: Pharmacy Technician

## 2013-06-24 ENCOUNTER — Encounter (HOSPITAL_COMMUNITY): Payer: Self-pay

## 2013-06-24 ENCOUNTER — Encounter (INDEPENDENT_AMBULATORY_CARE_PROVIDER_SITE_OTHER): Payer: Self-pay

## 2013-06-24 ENCOUNTER — Encounter (HOSPITAL_COMMUNITY)
Admission: RE | Admit: 2013-06-24 | Discharge: 2013-06-24 | Disposition: A | Discharge status: Home / Self Care | Payer: Medicare Other | Source: Ambulatory Visit | Attending: Urology | Admitting: Urology

## 2013-06-24 DIAGNOSIS — Z01812 Encounter for preprocedural laboratory examination: Secondary | ICD-10-CM | POA: Insufficient documentation

## 2013-06-24 HISTORY — DX: Personal history of urinary calculi: Z87.442

## 2013-06-24 HISTORY — DX: Other specified disorders of bladder: N32.89

## 2013-06-24 LAB — BASIC METABOLIC PANEL
BUN: 22 mg/dL (ref 6–23)
CALCIUM: 9.3 mg/dL (ref 8.4–10.5)
CO2: 28 mEq/L (ref 19–32)
Chloride: 107 mEq/L (ref 96–112)
Creatinine, Ser: 0.81 mg/dL (ref 0.50–1.10)
GFR calc Af Amer: 75 mL/min — ABNORMAL LOW (ref >90)
GFR calc non Af Amer: 64 mL/min — ABNORMAL LOW (ref >90)
GLUCOSE: 106 mg/dL — AB (ref 70–99)
Potassium: 4.5 mEq/L (ref 3.7–5.3)
Sodium: 146 mEq/L (ref 137–147)

## 2013-06-24 LAB — CBC
HEMATOCRIT: 36.1 % (ref 36.0–46.0)
HEMOGLOBIN: 12.1 g/dL (ref 12.0–15.0)
MCH: 29.9 pg (ref 26.0–34.0)
MCHC: 33.5 g/dL (ref 30.0–36.0)
MCV: 89.1 fL (ref 78.0–100.0)
Platelets: 296 10*3/uL (ref 150–400)
RBC: 4.05 MIL/uL (ref 3.87–5.11)
RDW: 12.4 % (ref 11.5–15.5)
WBC: 8.1 10*3/uL (ref 4.0–10.5)

## 2013-06-24 NOTE — Patient Instructions (Signed)
[name]  06/24/2013                           YOUR PROCEDURE IS SCHEDULED ON: 06/30/13 AT 9:15 AM               ENTER THRU [facility] [place] ENTRANCE AND                            FOLLOW  SIGNS TO SHORT STAY CENTER                 ARRIVE AT SHORT STAY AT: 7:15 AM               CALL THIS NUMBER IF ANY PROBLEMS THE DAY OF SURGERY :               [phone]                                REMEMBER:   Do not eat food or drink liquids AFTER MIDNIGHT                 Take these medicines the morning of surgery with               A SIPS OF WATER :    NONE /  MAY USE ALBUTEROL INHALER IF NEEDED     Do not wear jewelry, make-up   Do not wear lotions, powders, or perfumes.   Do not shave legs or underarms 12 hrs. before surgery (men may shave face)  Do not bring valuables to the hospital.  Contacts, dentures or bridgework may not be worn into surgery.  Leave suitcase in the car. After surgery it may be brought to your room.  For patients admitted to the hospital more than one night, checkout time is            11:00 AM                                                       The day of discharge.   Patients discharged the day of surgery will not be allowed to drive home.            If going home same day of surgery, must have someone stay with you              FIRST 24 hrs at home and arrange for some one to drive you              home from hospital.   ________________________________________________________________________                                                                        [facility]  Before surgery, you can play an important role.  Because skin is not sterile, your skin needs to be as free of germs as possible.  You can  reduce the number of germs on your skin by washing with CHG (chlorahexidine gluconate) soap before surgery.  CHG is an antiseptic cleaner which kills germs and bonds with the skin to continue killing  germs even after washing. Please DO NOT use if you have an allergy to CHG or antibacterial soaps.  If your skin becomes reddened/irritated stop using the CHG and inform your nurse when you arrive at Short Stay. Do not shave (including legs and underarms) for at least 48 hours prior to the first CHG shower.  You may shave your face. Please follow these instructions carefully:   1.  Shower with CHG Soap the night before surgery and the  morning of Surgery.   2.  If you choose to wash your hair, wash your hair first as usual with your  normal  Shampoo.   3.  After you shampoo, rinse your hair and body thoroughly to remove the  shampoo.                                         4.  Use CHG as you would any other liquid soap.  You can apply chg directly  to the skin and wash . Gently wash with scrungie or clean wascloth    5.  Apply the CHG Soap to your body ONLY FROM THE NECK DOWN.   Do not use on open                           Wound or open sores. Avoid contact with eyes, ears mouth and genitals (private parts).                        Genitals (private parts) with your normal soap.              6.  Wash thoroughly, paying special attention to the area where your surgery  will be performed.   7.  Thoroughly rinse your body with warm water from the neck down.   8.  DO NOT shower/wash with your normal soap after using and rinsing off  the CHG Soap .                9.  Pat yourself dry with a clean towel.             10.  Wear clean pajamas.             11.  Place clean sheets on your bed the night of your first shower and do not  sleep with pets.  Day of Surgery : Do not apply any lotions/deodorants the morning of surgery.  Please wear clean clothes to the hospital/surgery center.  FAILURE TO FOLLOW THESE INSTRUCTIONS MAY RESULT IN THE CANCELLATION OF YOUR SURGERY    PATIENT  SIGNATURE_________________________________  ______________________________________________________________________

## 2013-06-24 NOTE — Progress Notes (Signed)
06/24/13 0954  OBSTRUCTIVE SLEEP APNEA  Have you ever been diagnosed with sleep apnea through a sleep study? No  Do you snore loudly (loud enough to be heard through closed doors)?  0  Do you often feel tired, fatigued, or sleepy during the daytime? 1  Has anyone observed you stop breathing during your sleep? 0  Do you have, or are you being treated for high blood pressure? 1  BMI more than 35 kg/m2? 0  Age over 78 years old? 1  Neck circumference greater than 40 cm/16 inches? 1  Gender: 0  Obstructive Sleep Apnea Score 4  Score 4 or greater  Results sent to PCP

## 2013-06-30 ENCOUNTER — Encounter (HOSPITAL_COMMUNITY): Payer: Self-pay | Admitting: *Deleted

## 2013-06-30 ENCOUNTER — Ambulatory Visit (HOSPITAL_COMMUNITY): Payer: Medicare Other | Admitting: Certified Registered Nurse Anesthetist

## 2013-06-30 ENCOUNTER — Encounter (HOSPITAL_COMMUNITY)
Admission: RE | Disposition: A | Discharge status: Home / Self Care | Payer: Self-pay | Source: Ambulatory Visit | Attending: Urology

## 2013-06-30 ENCOUNTER — Ambulatory Visit (HOSPITAL_COMMUNITY)
Admission: RE | Admit: 2013-06-30 | Discharge: 2013-06-30 | Disposition: A | Discharge status: Home / Self Care | Payer: Medicare Other | Source: Ambulatory Visit | Attending: Urology | Admitting: Urology

## 2013-06-30 ENCOUNTER — Encounter (HOSPITAL_COMMUNITY): Payer: Medicare Other | Admitting: Certified Registered Nurse Anesthetist

## 2013-06-30 DIAGNOSIS — C52 Malignant neoplasm of vagina: Secondary | ICD-10-CM | POA: Insufficient documentation

## 2013-06-30 DIAGNOSIS — J4489 Other specified chronic obstructive pulmonary disease: Secondary | ICD-10-CM | POA: Insufficient documentation

## 2013-06-30 DIAGNOSIS — Z79899 Other long term (current) drug therapy: Secondary | ICD-10-CM | POA: Insufficient documentation

## 2013-06-30 DIAGNOSIS — I509 Heart failure, unspecified: Secondary | ICD-10-CM | POA: Insufficient documentation

## 2013-06-30 DIAGNOSIS — Z7982 Long term (current) use of aspirin: Secondary | ICD-10-CM | POA: Insufficient documentation

## 2013-06-30 DIAGNOSIS — M129 Arthropathy, unspecified: Secondary | ICD-10-CM | POA: Insufficient documentation

## 2013-06-30 DIAGNOSIS — C670 Malignant neoplasm of trigone of bladder: Secondary | ICD-10-CM

## 2013-06-30 DIAGNOSIS — E039 Hypothyroidism, unspecified: Secondary | ICD-10-CM | POA: Insufficient documentation

## 2013-06-30 DIAGNOSIS — K219 Gastro-esophageal reflux disease without esophagitis: Secondary | ICD-10-CM | POA: Insufficient documentation

## 2013-06-30 DIAGNOSIS — D649 Anemia, unspecified: Secondary | ICD-10-CM | POA: Insufficient documentation

## 2013-06-30 DIAGNOSIS — C674 Malignant neoplasm of posterior wall of bladder: Secondary | ICD-10-CM | POA: Insufficient documentation

## 2013-06-30 DIAGNOSIS — J449 Chronic obstructive pulmonary disease, unspecified: Secondary | ICD-10-CM | POA: Insufficient documentation

## 2013-06-30 DIAGNOSIS — I10 Essential (primary) hypertension: Secondary | ICD-10-CM | POA: Insufficient documentation

## 2013-06-30 DIAGNOSIS — Z87891 Personal history of nicotine dependence: Secondary | ICD-10-CM | POA: Insufficient documentation

## 2013-06-30 HISTORY — PX: TRANSURETHRAL RESECTION OF BLADDER TUMOR WITH GYRUS (TURBT-GYRUS): SHX6458

## 2013-06-30 SURGERY — TRANSURETHRAL RESECTION OF BLADDER TUMOR WITH GYRUS (TURBT-GYRUS)
Anesthesia: General

## 2013-06-30 MED ORDER — FENTANYL CITRATE 0.05 MG/ML IJ SOLN
INTRAMUSCULAR | Status: AC
  Filled 2013-06-30: qty 2

## 2013-06-30 MED ORDER — CLINDAMYCIN PHOSPHATE 2 % VA CREA
1.0000 | TOPICAL_CREAM | Freq: Every day | VAGINAL | Status: DC
  Administered 2013-06-30: 1 via VAGINAL
  Filled 2013-06-30: qty 40

## 2013-06-30 MED ORDER — CEFAZOLIN SODIUM-DEXTROSE 2-3 GM-% IV SOLR
INTRAVENOUS | Status: DC | PRN
  Administered 2013-06-30: 2 g via INTRAVENOUS

## 2013-06-30 MED ORDER — LIDOCAINE HCL 2 % EX GEL
CUTANEOUS | Status: DC | PRN
  Administered 2013-06-30: 1 via URETHRAL

## 2013-06-30 MED ORDER — ONDANSETRON HCL 4 MG/2ML IJ SOLN
INTRAMUSCULAR | Status: AC
  Filled 2013-06-30: qty 2

## 2013-06-30 MED ORDER — CIPROFLOXACIN IN D5W 400 MG/200ML IV SOLN
INTRAVENOUS | Status: AC
  Filled 2013-06-30: qty 200

## 2013-06-30 MED ORDER — LACTATED RINGERS IV SOLN
INTRAVENOUS | Status: DC | PRN
  Administered 2013-06-30: 08:00:00 via INTRAVENOUS

## 2013-06-30 MED ORDER — FENTANYL CITRATE 0.05 MG/ML IJ SOLN
25.0000 ug | INTRAMUSCULAR | Status: DC | PRN
  Administered 2013-06-30 (×3): 25 ug via INTRAVENOUS

## 2013-06-30 MED ORDER — PHENAZOPYRIDINE HCL 200 MG PO TABS
200.0000 mg | ORAL_TABLET | Freq: Once | ORAL | Status: AC
  Administered 2013-06-30: 200 mg via ORAL
  Filled 2013-06-30: qty 1

## 2013-06-30 MED ORDER — BELLADONNA ALKALOIDS-OPIUM 16.2-60 MG RE SUPP
RECTAL | Status: DC | PRN
  Administered 2013-06-30: 1 via RECTAL

## 2013-06-30 MED ORDER — CIPROFLOXACIN IN D5W 400 MG/200ML IV SOLN
400.0000 mg | INTRAVENOUS | Status: AC
  Administered 2013-06-30: 400 mg via INTRAVENOUS

## 2013-06-30 MED ORDER — LIDOCAINE HCL (CARDIAC) 20 MG/ML IV SOLN
INTRAVENOUS | Status: AC
  Filled 2013-06-30: qty 5

## 2013-06-30 MED ORDER — MORPHINE SULFATE ER 15 MG PO TBCR: 15.0000 mg | EXTENDED_RELEASE_TABLET | Freq: Two times a day (BID) | ORAL | Status: DC

## 2013-06-30 MED ORDER — ONDANSETRON HCL 4 MG/2ML IJ SOLN
INTRAMUSCULAR | Status: DC | PRN
  Administered 2013-06-30: 4 mg via INTRAVENOUS

## 2013-06-30 MED ORDER — PROPOFOL 10 MG/ML IV BOLUS
INTRAVENOUS | Status: AC
  Filled 2013-06-30: qty 20

## 2013-06-30 MED ORDER — PHENAZOPYRIDINE HCL 200 MG PO TABS: 200.0000 mg | ORAL_TABLET | Freq: Three times a day (TID) | ORAL | Status: DC | PRN

## 2013-06-30 MED ORDER — FENTANYL CITRATE 0.05 MG/ML IJ SOLN
INTRAMUSCULAR | Status: DC | PRN
  Administered 2013-06-30: 50 ug via INTRAVENOUS
  Administered 2013-06-30 (×2): 25 ug via INTRAVENOUS

## 2013-06-30 MED ORDER — LIDOCAINE HCL 2 % EX GEL
CUTANEOUS | Status: AC
  Filled 2013-06-30: qty 10

## 2013-06-30 MED ORDER — LACTATED RINGERS IV SOLN: INTRAVENOUS | Status: DC

## 2013-06-30 MED ORDER — LIDOCAINE HCL (CARDIAC) 20 MG/ML IV SOLN
INTRAVENOUS | Status: DC | PRN
  Administered 2013-06-30: 50 mg via INTRAVENOUS

## 2013-06-30 MED ORDER — SODIUM CHLORIDE 0.9 % IR SOLN
Status: DC | PRN
  Administered 2013-06-30: 6000 mL

## 2013-06-30 MED ORDER — FENTANYL CITRATE 0.05 MG/ML IJ SOLN
INTRAMUSCULAR | Status: DC
Start: 2013-06-30 — End: 2013-06-30
  Filled 2013-06-30: qty 2

## 2013-06-30 MED ORDER — BELLADONNA ALKALOIDS-OPIUM 16.2-60 MG RE SUPP
RECTAL | Status: AC
  Filled 2013-06-30: qty 1

## 2013-06-30 MED ORDER — DOCUSATE SODIUM 100 MG PO CAPS: 100.0000 mg | ORAL_CAPSULE | Freq: Two times a day (BID) | ORAL | Status: DC | PRN

## 2013-06-30 MED ORDER — OXYCODONE HCL 5 MG PO TABS: 5.0000 mg | ORAL_TABLET | ORAL | Status: DC | PRN

## 2013-06-30 MED ORDER — 0.9 % SODIUM CHLORIDE (POUR BTL) OPTIME
TOPICAL | Status: DC | PRN
  Administered 2013-06-30: 1000 mL

## 2013-06-30 MED ORDER — PROMETHAZINE HCL 25 MG/ML IJ SOLN: 6.2500 mg | INTRAMUSCULAR | Status: DC | PRN

## 2013-06-30 MED ORDER — PROPOFOL 10 MG/ML IV BOLUS
INTRAVENOUS | Status: DC | PRN
  Administered 2013-06-30: 170 mg via INTRAVENOUS

## 2013-06-30 MED ORDER — CEFAZOLIN SODIUM-DEXTROSE 2-3 GM-% IV SOLR
INTRAVENOUS | Status: AC
  Filled 2013-06-30: qty 50

## 2013-06-30 SURGICAL SUPPLY — 22 items
BAG URINE DRAINAGE (UROLOGICAL SUPPLIES) IMPLANT
BAG URO CATCHER STRL LF (DRAPE) ×3 IMPLANT
DRAPE CAMERA CLOSED 9X96 (DRAPES) ×3 IMPLANT
ELECT BUTTON HF 24-28F 2 30DE (ELECTRODE) IMPLANT
ELECT LOOP MED HF 24F 12D (CUTTING LOOP) IMPLANT
ELECT LOOP MED HF 24F 12D CBL (CLIP) ×3 IMPLANT
ELECT REM PT RETURN 9FT ADLT (ELECTROSURGICAL) ×3
ELECT RESECT VAPORIZE 12D CBL (ELECTRODE) IMPLANT
ELECTRODE REM PT RTRN 9FT ADLT (ELECTROSURGICAL) ×1 IMPLANT
EVACUATOR MICROVAS BLADDER (UROLOGICAL SUPPLIES) IMPLANT
GLOVE BIOGEL PI IND STRL 7.5 (GLOVE) ×2 IMPLANT
GLOVE BIOGEL PI INDICATOR 7.5 (GLOVE) ×4
GLOVE ECLIPSE 7.0 STRL STRAW (GLOVE) ×3 IMPLANT
GOWN STRL REUS W/TWL XL LVL3 (GOWN DISPOSABLE) ×3 IMPLANT
KIT ASPIRATION TUBING (SET/KITS/TRAYS/PACK) IMPLANT
MANIFOLD NEPTUNE II (INSTRUMENTS) ×3 IMPLANT
PACK CYSTO (CUSTOM PROCEDURE TRAY) ×3 IMPLANT
PACKING VAGINAL (PACKING) ×3 IMPLANT
SCRUB PCMX 4 OZ (MISCELLANEOUS) ×3 IMPLANT
SYRINGE IRR TOOMEY STRL 70CC (SYRINGE) IMPLANT
TUBING CONNECTING 10 (TUBING) ×2 IMPLANT
TUBING CONNECTING 10' (TUBING) ×1

## 2013-06-30 NOTE — Discharge Instructions (Signed)
Transurethral Resection of Bladder Tumor (TURBT) or Bladder Biopsy   Definition:  Transurethral Resection of the Bladder Tumor is a surgical procedure used to diagnose and remove tumors within the bladder. TURBT is the most common treatment for early stage bladder cancer.  General instructions:     Your recent bladder surgery requires very little post hospital care but some definite precautions.  Despite the fact that no skin incisions were used, the area around the bladder incisions are raw and covered with scabs to promote healing and prevent bleeding. Certain precautions are needed to insure that the scabs are not disturbed over the next 2-4 weeks while the healing proceeds.  Because the raw surface inside your bladder and the irritating effects of urine you may expect frequency of urination and/or urgency (a stronger desire to urinate) and perhaps even getting up at night more often. This will usually resolve or improve slowly over the healing period. You may see some blood in your urine over the first 6 weeks. Do not be alarmed, even if the urine was clear for a while. Get off your feet and drink lots of fluids until clearing occurs. If you start to pass clots or don't improve call us.  Diet:  You may return to your normal diet immediately. Because of the raw surface of your bladder, alcohol, spicy foods, foods high in acid and drinks with caffeine may cause irritation or frequency and should be used in moderation. To keep your urine flowing freely and avoid constipation, drink plenty of fluids during the day (8-10 glasses). Tip: Avoid cranberry juice because it is very acidic.  Activity:  Your physical activity doesn't need to be restricted. However, if you are very active, you may see some blood in the urine. We suggest that you reduce your activity under the circumstances until the bleeding has stopped.  Bowels:  It is important to keep your bowels regular during the postoperative  period. Straining with bowel movements can cause bleeding. A bowel movement every other day is reasonable. Use a mild laxative if needed, such as milk of magnesia 2-3 tablespoons, or 2 Dulcolax tablets. Call if you continue to have problems. If you had been taking narcotics for pain, before, during or after your surgery, you may be constipated. Take a laxative if necessary.    Medication:  You should resume your pre-surgery medications unless told not to. In addition you may be given an antibiotic to prevent or treat infection. Antibiotics are not always necessary. All medication should be taken as prescribed until the bottles are finished unless you are having an unusual reaction to one of the drugs.  Morphine 15mg  extended release every 12 hours scheduled. Oxycodone 5mg  every 4 hours as needed for break through pain Colace 100mg  twice daily for constipation Pyridium for stinging/burning with urination

## 2013-06-30 NOTE — Anesthesia Preprocedure Evaluation (Signed)
Anesthesia Evaluation  Patient identified by MRN, date of birth, ID band Patient awake    Reviewed: Allergy & Precautions, H&P , NPO status , Patient's Chart, lab work & pertinent test results  Airway Mallampati: II TM Distance: >3 FB Neck ROM: Full    Dental  (+) Edentulous Upper, Edentulous Lower   Pulmonary neg pulmonary ROS, shortness of breath and at rest, COPDformer smoker,  breath sounds clear to auscultation  Pulmonary exam normal       Cardiovascular hypertension, Pt. on medications +CHF Rhythm:Regular Rate:Normal     Neuro/Psych negative neurological ROS  negative psych ROS   GI/Hepatic negative GI ROS, Neg liver ROS, GERD-  ,  Endo/Other  Hypothyroidism   Renal/GU negative Renal ROS  negative genitourinary   Musculoskeletal  (+) Arthritis -,   Abdominal   Peds  Hematology  (+) anemia ,   Anesthesia Other Findings   Reproductive/Obstetrics negative OB ROS                           Anesthesia Physical Anesthesia Plan  ASA: III  Anesthesia Plan: General   Post-op Pain Management:    Induction: Intravenous  Airway Management Planned: LMA  Additional Equipment:   Intra-op Plan:   Post-operative Plan: Extubation in OR  Informed Consent: I have reviewed the patients History and Physical, chart, labs and discussed the procedure including the risks, benefits and alternatives for the proposed anesthesia with the patient or authorized representative who has indicated his/her understanding and acceptance.   Dental advisory given  Plan Discussed with: CRNA  Anesthesia Plan Comments:         Anesthesia Quick Evaluation

## 2013-06-30 NOTE — Op Note (Signed)
Preoperative diagnosis:  1. Bladder  mass   Postoperative diagnosis:  1. 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 Transvaginal biospy 2. TURBT 2-5cm, located at the bladder base   Surgeon: Ardis Hughs, MD  Anesthesia: General  Complications: None  Intraoperative findings: I began the operation by taking biopsies of the anterior vaginal wall where to palpate the mass and sending these for frozen section. This returned poorly differentiated squamous cell carcinoma. I then proceeded with cystoscopy and transurethral resection of the tumor located on the posterior wall and trigone of the bladder. I took 3 separate sections and fulgurated the base. The ureteral orifice ease were not visible bilaterally. There were no satellite lesions associated with the mass. The exam under anesthesia reveals a nodular firm mass on the anterior vaginal wall.  EBL: Minimal  Specimens:  #1. 3 core needle biopsies were taken and sent for frozen specimen returning as poorly differentiated squamous cell carcinoma #23 separate TUR specimens were sent for permanent section taken from the bladder trigone/posterior wall.  Indication: [name] is a 80 y.o. patient who initially presented to me with a bladder mass noted on a CAT scan performed for microscopic hematuria. Further evaluation on cystoscopy revealed a fungating tumor at the floor of the bladder. An MRI was obtained which suggested that the tumor was invading from the cervix.  After reviewing the management options for treatment, he elected to proceed with the above surgical procedure(s). We have discussed the potential benefits and risks of the procedure, side effects of the proposed treatment, the likelihood of the patient achieving the goals of the procedure, and any potential problems that might occur during the procedure or recuperation. Informed consent has been obtained.  Description of  procedure:  The patient was taken to the operating room and general anesthesia was induced.  The patient was placed in the dorsal lithotomy position, prepped and draped in the usual sterile fashion, and preoperative antibiotics were administered. A preoperative time-out was performed.   I started the procedure using a core needle biopsy done and after prepping the vagina taking 3 core needle biopsies from the anterior vaginal wall in the area that was nodular and palpably abnormal. I then packed this area with vaginal packing impregnated with clindamycin cream. The specimens were then sent to pathology for frozen section.  The perineum and vaginal area was then reprepped and draped. A second timeout was performed. I then inserted a 70 67 French rigid cystoscope into the patient's urethra and into the bladder under visual guidance. A 360 cystoscopic evaluation was performed with the above findings. I then exchanged the 70 lens for a 12.5 lens and inserted the 26 French resectoscope. Pathology then called back with results of the frozen specimen of this was poorly differentiated squamous cell carcinoma of unclear etiology. More tissue would be needed. As such, I resected 3 nodular areas within the base of the bladder around the trigone. I fulgurated the bases. Hemostasis was noted to be adequate. The bladder was then emptied. I then inserted a BNO suppository into the patient's rectum and 2% viscous lidocaine jelly into the patient's urethra. She was subsequently extubated and returned to the PACU in excellent condition.  Ardis Hughs, M.D.

## 2013-06-30 NOTE — Anesthesia Postprocedure Evaluation (Signed)
Anesthesia Post Note  Patient: [name]  Procedure(s) Performed: Procedure(s) (LRB): TRANSURETHRAL RESECTION OF BLADDER TUMOR WITH GYRUS (TURBT-GYRUS)/VAGINAL BIOPSY (N/A)  Anesthesia type: General  Patient location: PACU  Post pain: Pain level controlled  Post assessment: Post-op Vital signs reviewed  Last Vitals:  Filed Vitals:   06/30/13 1115  BP: 148/59  Pulse: 51  Temp: 36.7 C  Resp: 15    Post vital signs: Reviewed  Level of consciousness: sedated  Complications: No apparent anesthesia complications

## 2013-06-30 NOTE — Transfer of Care (Signed)
Immediate Anesthesia Transfer of Care Note  Patient: [name]  Procedure(s) Performed: Procedure(s): TRANSURETHRAL RESECTION OF BLADDER TUMOR WITH GYRUS (TURBT-GYRUS)/VAGINAL BIOPSY (N/A)  Patient Location: PACU  Anesthesia Type:General  Level of Consciousness: awake and alert   Airway & Oxygen Therapy: Patient Spontanous Breathing and Patient connected to face mask oxygen  Post-op Assessment: Report given to PACU RN and Post -op Vital signs reviewed and stable  Post vital signs: Reviewed and stable  Complications: No apparent anesthesia complications

## 2013-06-30 NOTE — H&P (Signed)
History of Present Illness This is an 80 year old female referred by Dr. Claris Gower, M.D. for evaluation and management of pelvic mass on CT scan.  The patient first developed symptoms approximately 4 months ago. Her symptoms include dysuria and pelvic pain. She describes severe burning with urination. This has been progressive. Currently she has pelvic pain which is worse at the end of the day and when she sits. This is also in addition to her dysuria. The pain is often so bad that she doubles over in pain. She denies any radiation to her flanks. She denies any changes to her bowel habits. She has been taking Tylenol with Codeine intermittently which helps with her pain but typically does not make it go away. The patient has had urine cultures which were negative she's also had a urine cytology which was negative. She had a CT scan which revealed a small pelvic mass. She then had an ultrasound transvaginally which suggested that the mass was a cervical remnant. She has been evaluated by Dr. Benjie Karvonen who thought that she needed further evaluation by urology.  The patient has no history of urinary tract infections. She denies any gross hematuria. She has passed a kidney stone many years ago. She denies any symptoms of bladder prolapse. She has occasional stress urinary incontinence. She also has urge incontinence. This is stable for years now.  The patient has had a diverting colostomy for diverticulitis which was then reanastomosed at the end of last year, proximally 5 or 6 months ago. She has had no complications from this. She had a hysterectomy approximately 40 years ago. She's had no other GU or GYN surgeries.    Workup:  cystoscopy revealed a mass at the trigone which appeared to be emanating from the posterior bladder floor or anterior vaginal wall. Her pelvic exam showed a very tender anterior vaginal wall with a bulky/lobulated mass. An MRI was obtained which confirm the presence of a mass that  appears to originate from the anterior vaginal cuff/cervix.    Interval: MRI was performed of her pelvis, confirming that mass appears to be arising from her cervical cuff and extending into the bladder. The patient's symptoms are not improved with Uribel. She states that ibuprofen has helped her pain the most. The oxycodone seems to make her pain tolerable but does not relieve it altogether. Otherwise her pain is unchanged.   Past Medical History Problems  1. History of arthritis (V13.4) 2. History of cardiac murmur (V12.59) 3. History of esophageal reflux (V12.79) 4. History of goiter (V12.29)  Surgical History Problems  1. History of Colostomy 2. History of Hysteroscopy Of Uterus  Current Meds 1. Albuterol 90 MCG/ACT AERS;  Therapy: (Recorded:12May2015) to Recorded 2. Aspirin 81 MG Oral Tablet;  Therapy: (Recorded:12May2015) to Recorded 3. Furosemide 80 MG Oral Tablet;  Therapy: (Recorded:12May2015) to Recorded 4. Lisinopril 20 MG Oral Tablet;  Therapy: (Recorded:12May2015) to Recorded 5. OxyCODONE HCl - 5 MG Oral Capsule; TAKE 1 CAPSULE EVERY 4  TO 6 HOURS AS  NEEDED FOR BREAKTHROUGH PAIN;  Therapy: [id number] to (Evaluate:22May2015); Last Rx:12May2015 Ordered  Allergies Medication  1. Indocin CAPS  Family History Problems  1. Family history of congestive heart failure (V17.49) : Mother 2. Family history of malignant neoplasm of breast (V16.3) : Father, Sister 3. Family history of Old age : Father  Social History Problems  1. Denied: History of Alcohol use 2. Caffeine use (V49.89) 3. Never smoker 4. Number of children   6 children 5. Widower  Vitals  Vital Signs [Data Includes: Last 1 Day]  Recorded: [id number] 09:18AM  Height: 5 ft 1 in Weight: 152 lb  BMI Calculated: 28.72 BSA Calculated: 1.68 Blood Pressure: 162 / 71 Temperature: 97.5 F Heart Rate: 60  Physical Exam Constitutional: Well nourished and well developed . No acute distress.  ENT:. The  ears and nose are normal in appearance.  Neck: The appearance of the neck is normal and no neck mass is present.  Pulmonary: No respiratory distress, normal respiratory rhythm and effort and clear bilateral breath sounds.  Cardiovascular: Heart rate and rhythm are normal . The arterial pulses are normal. No peripheral edema. A grade /6 systolic murmur was heard.  Abdomen: The abdomen is soft and nontender. No masses are palpated. No CVA tenderness. No hernias are palpable. No hepatosplenomegaly noted.  Lymphatics: The femoral and inguinal nodes are not enlarged or tender.  Skin: Normal skin turgor, no visible rash and no visible skin lesions.  Neuro/Psych:. Mood and affect are appropriate.    Results/Data Urine [Data Includes: Last 1 Day]   [id number]  COLOR YELLOW   APPEARANCE CLEAR   SPECIFIC GRAVITY 1.015   pH 6.5   GLUCOSE NEG mg/dL  BILIRUBIN NEG   KETONE NEG mg/dL  BLOOD TRACE   PROTEIN 30 mg/dL  UROBILINOGEN 1 mg/dL  NITRITE NEG   LEUKOCYTE ESTERASE TRACE   SQUAMOUS EPITHELIAL/HPF MODERATE   WBC 7-10 WBC/hpf  RBC 3-6 RBC/hpf  BACTERIA MODERATE   CRYSTALS NONE SEEN   CASTS NONE SEEN    The following images/tracing/specimen were independently visualized: Marland Kitchen   Pelvic MRI:  IMPRESSION:  1. Mass arising from the vaginal cuff is identified and is  concerning for tumor recurrence. This has mass effect and/or invades  the posterior wall of the urinary bladder. Correlation with tissue  sampling is recommended.    Assessment Patient has a bladder mass identified on cystoscopy which appears to be extending into the bladder from the patient's cervix and anterior vaginal wall.   Plan Health Maintenance  1. UA With REFLEX; [Do Not Release]; Status:Complete;   Done: [id number] 09:11AM Pelvic mass in female  2. Follow-up Schedule Surgery Office  Follow-up  Status: Complete  Done: [id number]  Discussion/Summary The plan is to take the patient to the operating room for a  biopsy. Once we establish a tissue diagnosis I will present her case to the tumor board. I got over the biopsy procedure in detail including risks and benefits. We will plan to get this scheduled as soon as possible. I have requested medical clearance from the patient's primary care doctor.

## 2013-06-30 NOTE — Interval H&P Note (Signed)
History and Physical Interval Note: Patient has been cleared medically.  No changes to H&P.  Proceed with biospy. 06/30/2013 8:58 AM  [name]  has presented today for surgery, with the diagnosis of BLADDER MASS  The various methods of treatment have been discussed with the patient and family. After consideration of risks, benefits and other options for treatment, the patient has consented to  Procedure(s): TRANSURETHRAL RESECTION OF BLADDER TUMOR WITH GYRUS (TURBT-GYRUS)/VAGINAL BIOPSY (N/A) as a surgical intervention .  The patient's history has been reviewed, patient examined, no change in status, stable for surgery.  I have reviewed the patient's chart and labs.  Questions were answered to the patient's satisfaction.     [name]

## 2013-07-01 ENCOUNTER — Encounter (HOSPITAL_COMMUNITY): Payer: Self-pay | Admitting: Urology

## 2013-07-02 SURGERY — TURBT (TRANSURETHRAL RESECTION OF BLADDER TUMOR)
Anesthesia: General

## 2013-07-07 ENCOUNTER — Other Ambulatory Visit (HOSPITAL_COMMUNITY): Payer: Self-pay | Admitting: Urology

## 2013-07-07 DIAGNOSIS — C539 Malignant neoplasm of cervix uteri, unspecified: Secondary | ICD-10-CM

## 2013-07-14 ENCOUNTER — Ambulatory Visit (HOSPITAL_COMMUNITY)
Admission: RE | Admit: 2013-07-14 | Discharge: 2013-07-14 | Disposition: A | Discharge status: Home / Self Care | Payer: Medicare Other | Source: Ambulatory Visit | Attending: Urology | Admitting: Urology

## 2013-07-14 DIAGNOSIS — E042 Nontoxic multinodular goiter: Secondary | ICD-10-CM | POA: Insufficient documentation

## 2013-07-14 DIAGNOSIS — C539 Malignant neoplasm of cervix uteri, unspecified: Secondary | ICD-10-CM

## 2013-07-14 LAB — GLUCOSE, CAPILLARY: Glucose-Capillary: 102 mg/dL — ABNORMAL HIGH (ref 70–99)

## 2013-07-14 MED ORDER — FLUDEOXYGLUCOSE F - 18 (FDG) INJECTION
7.3000 | Freq: Once | INTRAVENOUS | Status: AC | PRN
  Administered 2013-07-14: 7.3 via INTRAVENOUS

## 2013-07-15 ENCOUNTER — Telehealth: Payer: Self-pay | Admitting: *Deleted

## 2013-07-15 NOTE — Telephone Encounter (Signed)
Called pt to discuss new pt appt with Dr. Denman George. Pt advised Dr. Louis Meckel told her she would be coming to Cancer center to see a GYN oncologist. Pt confirmed date/time, requested I call her daughter Rory Percy to confirm date/time as she will be bringing pt to appt and helps her with all her needs. Pt gave me Shar[facility] phone # [phone], unable to reach at this time. Lmovm for Ivin Booty to call back to confirm appt date/time.

## 2013-07-24 ENCOUNTER — Ambulatory Visit: Payer: Medicare Other | Attending: Gynecologic Oncology | Admitting: Gynecologic Oncology

## 2013-07-24 ENCOUNTER — Encounter: Payer: Self-pay | Admitting: Gynecologic Oncology

## 2013-07-24 VITALS — BP 135/64 | HR 63 | Temp 97.8°F | Resp 16 | Ht 61.0 in | Wt 146.2 lb

## 2013-07-24 DIAGNOSIS — Z90710 Acquired absence of both cervix and uterus: Secondary | ICD-10-CM | POA: Diagnosis not present

## 2013-07-24 DIAGNOSIS — K5900 Constipation, unspecified: Secondary | ICD-10-CM

## 2013-07-24 DIAGNOSIS — R1900 Intra-abdominal and pelvic swelling, mass and lump, unspecified site: Secondary | ICD-10-CM

## 2013-07-24 DIAGNOSIS — C779 Secondary and unspecified malignant neoplasm of lymph node, unspecified: Secondary | ICD-10-CM | POA: Diagnosis present

## 2013-07-24 DIAGNOSIS — C4492 Squamous cell carcinoma of skin, unspecified: Secondary | ICD-10-CM | POA: Diagnosis not present

## 2013-07-24 DIAGNOSIS — C50919 Malignant neoplasm of unspecified site of unspecified female breast: Secondary | ICD-10-CM

## 2013-07-24 DIAGNOSIS — C801 Malignant (primary) neoplasm, unspecified: Secondary | ICD-10-CM

## 2013-07-24 DIAGNOSIS — N949 Unspecified condition associated with female genital organs and menstrual cycle: Secondary | ICD-10-CM

## 2013-07-24 MED ORDER — OXYCODONE HCL 5 MG PO TABS: 5.0000 mg | ORAL_TABLET | ORAL | Status: DC | PRN

## 2013-07-24 MED ORDER — MORPHINE SULFATE ER 15 MG PO TBCR: 15.0000 mg | EXTENDED_RELEASE_TABLET | Freq: Two times a day (BID) | ORAL | Status: DC

## 2013-07-24 NOTE — Progress Notes (Signed)
Consult Note: Gyn-Onc  Consult was requested by Dr. Louis Meckel for the evaluation of [name] 80 y.o. female with squamous cell carcinoma of unknown origin.  CC:  Chief Complaint  Patient presents with  . Abdominal mass    Assessment/Plan:  [name]  is a 80 y.o.  year old who is seen in consultation at the request of Dr. 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   1/ Locally advanced, metastatic pelvic SCC of unknown origin:   [name] has a 5 cm mass at the posterior trigone and vaginal cuff. Biopsies were taken of this mass through both the vagina and the bladder by Dr. Louis Meckel and showed invasive poorly differentiated carcinoma with immunostains consistent with squamous cell carcinoma. This is likely either a primary bladder or vaginal tumor, and metastatic to adjacent inguinal lymph nodes on PET scan. The clinical presentation is neither consistent with primary bladder cancer abnormal primary vaginal cancer. The patient has undergone a TAH/BSO in the 1980s for benign process and therefore does not have a cervix. On examination today the vaginal mucosa is completely normal. It is possible that this cancer arose from the deep margin of a small remnant of residual cervix.  Regardless of the determined organ of origin, she is not a surgical candidate for 2 reasons: a/her tumor is locally extensive and metastatic on PET scan to pelvic lymph nodes therefore an exenterative procedure which would be necessary would not be curative b/her age, prior surgeries, in general comorbidities mean that she would be a poor surgical candidate for such a radical procedure (anterior exenteration and urinary diversion) c/[name] herself does not desire a major surgical procedure.  I feel that primary radiation or chemoradiation would be ideal for this patient. Her primary concern is palliation of her severe pelvic pain. Given her age and comorbidities, radiation  without chemotherapy may be prudent and better tolerated than weekly cisplatin chemosensitization. I have arrange consultation with Dr. Sondra Come from [place] at he will see her as soon as possible to assist in palliation of her symptoms particularly pain and to initiate a regimen.  2/ Pelvic pain: I prescribed her oxycodone and MS Contin to alleviate her symptoms of pelvic pain.  3/ Constipation: It is unclear if this constipation is secondary to anastomotic stricture (as her symptoms developed following reanastomosis of her colon following a Hartman's procedure) or if these are secondary to narcotic use, or if these are secondary to obstruction from a pelvic mass. Because of concern of obstructive constipation, I am recommending she avoid the use of agents such as Lubiprostone which are contraindicated in case as above structure. I counseled her regarding a regimen of MiraLAX, milk of magnesia, and magnesium citrate until she commences radiation therapy. Radiation is commonly associated with loose stools. Additionally if the radiation causes reduction in the size of the mass it may alleviate her obstruction if this is an etiology.   HPI: [name] is an 80 year old woman with a poorly differentiated squamous cell carcinoma arising in the vesicular vaginal region of the central pelvis.   she again developing difficulty initiating urination in October of 2014 after her a reversal of a Hartman's procedure and take down of colostomy. Of note during that surgery no pelvic pathology was appreciated. She was seen by Dr. Louis Meckel from [facility].  An MRI confirmed a 5 cm mass in the posterior trigone and vaginal cuff centrally. There was suggestion that the mass  was originating from the uterine cervix, however the patient has a history of a prior TAH with removal of the cervi.. A cystoscopy confirmed the mass residing between the bladder and the vagina, with normal vaginal mucosa.   Cystoscopy  on 06/30/13 also retrieved samples from the posterior wall and trigone of the bladder with transurethral resection. The blood him mucosa was involved with a fungating tumor at the floor of the bladder.   PET scan 07/14/2013 revealed PET avid 5 cm lesion involving the vaginal cuff and posterior bladder base. Small less than 1 cm hypermetabolic lymph nodes were seen in the right iliac chain and left internal iliac chain both of which were consistent with metastatic disease.  Interval History:  the patient's pain persists after the surgery to biopsy bladder. She denies hematuria but does report ongoing constipation. She denies vaginal bleeding or rectal bleeding.   Current Meds:  Outpatient Encounter Prescriptions as of 07/24/2013  Medication Sig  . acetaminophen (TYLENOL) 500 MG tablet Take 1,000 mg by mouth every 6 (six) hours as needed (Pain).  Marland Kitchen albuterol (PROVENTIL HFA;VENTOLIN HFA) 108 (90 BASE) MCG/ACT inhaler Inhale 2 puffs into the lungs every 6 (six) hours as needed for wheezing.  Marland Kitchen amoxicillin (AMOXIL) 500 MG capsule Take 1,000 mg by mouth every 8 (eight) hours. 10 day course. Started 06/15/13  . docusate sodium (COLACE) 100 MG capsule Take 1 capsule (100 mg total) by mouth 2 (two) times daily as needed (take to keep stool soft.).  Marland Kitchen furosemide (LASIX) 80 MG tablet Take 40 mg by mouth daily.  Marland Kitchen lisinopril (PRINIVIL,ZESTRIL) 20 MG tablet Take 10 mg by mouth every morning.   Marland Kitchen morphine (MS CONTIN) 15 MG 12 hr tablet Take 1 tablet (15 mg total) by mouth every 12 (twelve) hours.  Marland Kitchen oxyCODONE (ROXICODONE) 5 MG immediate release tablet Take 1 tablet (5 mg total) by mouth every 4 (four) hours as needed for breakthrough pain.  . phenazopyridine (PYRIDIUM) 200 MG tablet Take 1 tablet (200 mg total) by mouth 3 (three) times daily as needed for pain.  . polyvinyl alcohol (LIQUIFILM TEARS) 1.4 % ophthalmic solution Place 1 drop into both eyes as needed for dry eyes.  . Vitamin D, Ergocalciferol,  (DRISDOL) 50000 UNITS CAPS Take 50,000 Units by mouth every 14 (fourteen) days. Sunday  . [DISCONTINUED] morphine (MS CONTIN) 15 MG 12 hr tablet Take 1 tablet (15 mg total) by mouth every 12 (twelve) hours.  . [DISCONTINUED] oxyCODONE (ROXICODONE) 5 MG immediate release tablet Take 1 tablet (5 mg total) by mouth every 4 (four) hours as needed for breakthrough pain.    Allergy:  Allergies  Allergen Reactions  . Indomethacin Anaphylaxis and Other (See Comments)    "took it fine for awhile; one day I stopped breathing and I ended up in the hospital" (01/02/2012)    Social Hx:   History   Social History  . Marital Status: Widowed    Spouse Name: N/A    Number of Children: 6  . Years of Education: N/A   Occupational History  . retired    Social History Main Topics  . Smoking status: Former Smoker -- 0.12 packs/day for 35 years    Types: Cigarettes    Quit date: 01/02/1991  . Smokeless tobacco: Never Used  . Alcohol Use: No  . Drug Use: No  . Sexual Activity: No   Other Topics Concern  . Not on file   Social History Narrative  . No narrative on file  Past Surgical Hx:  Past Surgical History  Procedure Laterality Date  . Cataract extraction w/ intraocular lens  implant, bilateral  ?1990's    Bil  . Appendectomy  1980's    "when they did hysterectomy" (01/02/2012)  . Abdominal hysterectomy  1980's  . Colostomy revision N/A 03/12/2012    Procedure: COLON RESECTION SIGMOID ;  Surgeon: Ralene Ok, MD;  Location: [facility];  Service: General;  Laterality: N/A;  . Colostomy N/A 03/12/2012    Procedure: COLOSTOMY;  Surgeon: Ralene Ok, MD;  Location: [facility];  Service: General;  Laterality: N/A;  . Colostomy takedown N/A 10/21/2012    Procedure: LAPAROSCOPIC COLOSTOMY TAKEDOWN AND HARTMANS ANASTOMOSIS, rigid proctoscopy;  Surgeon: Ralene Ok, MD;  Location: [facility];  Service: General;  Laterality: N/A;  . Lysis of adhesion N/A 10/21/2012    Procedure: LYSIS OF ADHESION;   Surgeon: Ralene Ok, MD;  Location: [facility];  Service: General;  Laterality: N/A;  . Transurethral resection of bladder tumor with gyrus (turbt-gyrus) N/A 06/30/2013    Procedure: TRANSURETHRAL RESECTION OF BLADDER TUMOR WITH GYRUS (TURBT-GYRUS)/VAGINAL BIOPSY;  Surgeon: Ardis Hughs, MD;  Location: [facility];  Service: Urology;  Laterality: N/A;    Past Medical Hx:  Past Medical History  Diagnosis Date  . Hypertension   . Goiter     radioactive iodine ablation/notes 01/02/2012  . COPD (chronic obstructive pulmonary disease)   . CHF (congestive heart failure)   . SOB (shortness of breath)     'all the time" (01/02/2012)  . Arthritis     "right shoulder" (01/02/2012)  . Pneumonia 01/02/2012; ? 2009  . GERD (gastroesophageal reflux disease)     occasional  . Anemia     years ago  . Diverticulitis of colon with perforation 01/14/2012    That is post sigmoid resection with [facility] pouch and end colostomy   . CAP (community acquired pneumonia) 01/02/2012    Lower lobe   . Intra-abdominal abscess 01/14/2012  . Pneumatosis of intestines 01/14/2012  . UTI (lower urinary tract infection) 01/03/2012  . Bladder mass   . History of kidney stones     X 1  . Hypothyroidism     "I have taken Synthroid before" (01/02/2012)   HAS HAD RADIOACTIVE IODINE TX 2 YR S AGO  . Difficulty sleeping     Past Gynecological History:  SVD x6. No prior abn pap smears. Hx of hysterectomy for "bleeding" in 1980's.  No LMP recorded. Patient has had a hysterectomy.  Family Hx:  Family History  Problem Relation Age of Onset  . Heart disease    . Cancer Sister     Breast    Review of Systems:  Constitutional  Feels pain in pelvix  ENT Normal appearing ears and nares bilaterally Skin/Breast  No rash, sores, jaundice, itching, dryness Cardiovascular  No chest pain, shortness of breath, or edema  Pulmonary  No cough or wheeze.  Gastro Intestinal  No nausea, vomitting, or diarrhoea. No bright red blood per  rectum, no abdominal pain. +++ constipation.  Genito Urinary  No frequency, urgency, dysuria, see HPI Musculo Skeletal  No myalgia, arthralgia, joint swelling or pain  Neurologic  No weakness, numbness, change in gait,  Psychology  No depression, anxiety, insomnia.   Vitals:  Blood pressure 135/64, pulse 63, temperature 97.8 F (36.6 C), temperature source Oral, resp. rate 16, height 5\' 1"  (1.549 m), weight 146 lb 3.2 oz (66.316 kg).  Physical Exam: WD in NAD Neck  Supple NROM,  without any enlargements.  Lymph Node Survey No cervical supraclavicular or inguinal adenopathy Cardiovascular  Pulse normal rate, regularity and rhythm. S1 and S2 normal.  Lungs  Clear to auscultation bilateraly, without wheezes/crackles/rhonchi. Good air movement.  Skin  No rash/lesions/breakdown  Psychiatry  Alert and oriented to person, place, and time  Abdomen  Normoactive bowel sounds, abdomen soft, non-tender and obese. She has a left mid abdominal 4cm incisional hernia.It is soft and easily reducible. Back No CVA tenderness Genito Urinary  Vulva/vagina: Normal external female genitalia.  No lesions. No discharge or bleeding.  Bladder/urethra:  Urethral meatus normal. Bladder has palpable mass centrally from mid vagina to vaginal apex.Minimally mobile.  Vagina: vaginal mucosa normal without lesions. No visible lesions. No blood.   Cervix: surgically absent  Uterus: surgically absent   Adnexa: no palpable masses. Rectal  Good tone, no masses no cul de sac nodularity. Unable to appreciate anastamosis. No palpable obstruction or masses. Extremities  No bilateral cyanosis, clubbing or edema.   Donaciano Eva, MD   07/24/2013, 4:44 PM

## 2013-07-24 NOTE — Patient Instructions (Signed)
You will follow up with Dr. Sondra Come

## 2013-07-27 NOTE — Progress Notes (Addendum)
GYN Location of Tumor / Histology: Locally advanced, metastatic pelvic SCC of unknown origin with hypermetabolic lymph nodes seen on PET scan from 07/14/13 in the right iliac chain and left internal iliac chain   [name] presented with difficulty initiating urination in October of 2014 after her a reversal of a Hartman's procedure and take down of colostomy.  Biopsies revealed:   06/30/13 Diagnosis 1. Vagina, biopsy, Anterior wall - INVASIVE POORLY DIFFERENTIATED CARCINOMA, PLEASE SEE COMMENT. 2. Bladder, biopsy, posterior wall - INVASIVE POORLY DIFFERENTIATED CARCINOMA. Microscopic Comment 1. , 2. Sections show invasive poorly differentiated carcinoma. Immunohistochemical  Past/Anticipated interventions by Gyn/Onc surgery, if any: abdominal hysterectomy in the 1980's, 06/30/13 - Procedure: TRANSURETHRAL RESECTION OF BLADDER TUMOR WITH GYRUS (TURBT-GYRUS)/VAGINAL BIOPSY;  Surgeon: Ardis Hughs, MD;  No further plans for surgery.  Past/Anticipated interventions by medical oncology, if any:   Past Gynecological History: SVD x6. No prior abn pap smears. Hx of hysterectomy for "bleeding" in 1980's. No LMP recorded. Patient has had a hysterectomy.  Weight changes, if any: loss 4-5 lbs last month loss appetite  Bowel/Bladder complaints, if any: constipation,dysuria, urgency, constipation, takes colace, miralax, MOM, magnisum citrate prn  Nausea/Vomiting, if any: none  Pain issues, if any:  No y SAFETY ISSUES:  Prior radiation? No, had radiation ablatin  For goiter 3 years ago,  Pacemaker/ICD? no  Possible current pregnancy? n/a  Is the patient on methotrexate? no  Current Complaints / other details: , Obesity,  6 children, menses age 57, first pregnancy age 33,  No HRT,  Retired from [employer],  Sister breast cancer,mother died heart disease,& heart failure,HTN Former smoker,quit 25-30 years ago,  No alcohol or illicit drug use;, moderate COPD, sob, ,hx mitral valve  prolapse,, hx V-Tach, ,hysterectomy,appendectomy,hypothyroidism,not taking synthroid medication

## 2013-07-28 ENCOUNTER — Encounter: Payer: Self-pay | Admitting: Radiation Oncology

## 2013-07-29 ENCOUNTER — Ambulatory Visit
Admission: RE | Admit: 2013-07-29 | Discharge: 2013-07-29 | Disposition: A | Discharge status: Home / Self Care | Payer: Medicare Other | Source: Ambulatory Visit | Attending: Radiation Oncology | Admitting: Radiation Oncology

## 2013-07-29 ENCOUNTER — Encounter: Payer: Self-pay | Admitting: Radiation Oncology

## 2013-07-29 VITALS — BP 142/87 | HR 65 | Temp 98.6°F | Resp 22 | Ht 60.0 in | Wt 147.4 lb

## 2013-07-29 DIAGNOSIS — I10 Essential (primary) hypertension: Secondary | ICD-10-CM | POA: Diagnosis not present

## 2013-07-29 DIAGNOSIS — Z87891 Personal history of nicotine dependence: Secondary | ICD-10-CM | POA: Insufficient documentation

## 2013-07-29 DIAGNOSIS — M25559 Pain in unspecified hip: Secondary | ICD-10-CM | POA: Insufficient documentation

## 2013-07-29 DIAGNOSIS — Z7982 Long term (current) use of aspirin: Secondary | ICD-10-CM | POA: Insufficient documentation

## 2013-07-29 DIAGNOSIS — Z510 Encounter for antineoplastic radiation therapy: Secondary | ICD-10-CM | POA: Insufficient documentation

## 2013-07-29 DIAGNOSIS — C4492 Squamous cell carcinoma of skin, unspecified: Secondary | ICD-10-CM

## 2013-07-29 DIAGNOSIS — J449 Chronic obstructive pulmonary disease, unspecified: Secondary | ICD-10-CM | POA: Diagnosis not present

## 2013-07-29 DIAGNOSIS — Z90710 Acquired absence of both cervix and uterus: Secondary | ICD-10-CM | POA: Diagnosis not present

## 2013-07-29 DIAGNOSIS — J4489 Other specified chronic obstructive pulmonary disease: Secondary | ICD-10-CM | POA: Insufficient documentation

## 2013-07-29 DIAGNOSIS — I509 Heart failure, unspecified: Secondary | ICD-10-CM | POA: Diagnosis not present

## 2013-07-29 NOTE — Progress Notes (Signed)
Please see the Nurse Progress Note in the MD Initial Consult Encounter for this patient. 

## 2013-07-29 NOTE — Progress Notes (Signed)
Radiation Oncology         [phone] ________________________________  Initial outpatient Consultation  Name: [name] MRN: [id number]  Date: 07/29/2013  DOB: [birth year]  OI:NOMVEH,MCNOBS Danne Baxter, MD  Everitt Amber, MD   REFERRING PHYSICIAN: Everitt Amber, MD  DIAGNOSIS: The encounter diagnosis was Squamous cell carcinoma of unknown origin.  HISTORY OF PRESENT ILLNESS::[name] is a 80 y.o. female who is seen out of the courtesy of Dr. Denman George for an opinion concerning radiation therapy as part of management of patient's recently diagnosed squamous cell carcinoma presenting in the lower pelvis region.  earlier this year the patient presented with dysuria and pelvic pain. She ultimately underwent a CT scan which revealed a possible mass in the vaginal apex region. A ultrasound, transvaginal was performed which revealed a masslike appearance c/w probable cervical remnant. An MRI of the pelvis revealed a mass arising from the vaginal cuff region which measured 2.7 x 4.0 x 3.8 cm. The patient was seen by urology and on cystoscopy the patient was noted to have a bladder mass which is approximately 3 cm in size. This tumor was located midline near the trigone of the bladder. The patient was taken to the operating room on June 30 and this was noted to have a tumor located along the posterior wall and trigone of the bladder. Patient underwent transurethral resection of this tumor.  on exam under anesthesia the patient was noted to have a nodular firm mass along the anterior vaginal wall. Biopsy from the anterior vaginal wall revealed poorly differentiated invasive poorly differentiated carcinoma. Biopsy from the bladder posterior wall also revealed invasive poorly differentiated carcinoma. Staining was most suggestive of a squamous cell carcinoma. P. 16 was negative. patient did undergo PET scan which showed hypermetabolic activity within this pelvic mass as well as hypermetabolic pelvic lymph nodes in  the internal iliac chains bilaterally. This was also noted to have a multinodular order which was hypermetabolic along the right superior lobe of the thyroid gland. She was seen by gynecologic oncology on pelvic examination the vaginal mucosa was completely normal.  the etiology of the patient's tumor was unknown. Possibly felt to be cancer that arose from the deep margin of a small remnant of residual cervix. She was not felt to be a surgical candidate in light of her age and the necessity of a anterior pelvic exenteration. Patient is now seen in radiation oncology for consideration for treatment.Marland Kitchen  PREVIOUS RADIATION THERAPY: No  PAST MEDICAL HISTORY:  has a past medical history of Hypertension; COPD (chronic obstructive pulmonary disease); CHF (congestive heart failure); SOB (shortness of breath); Arthritis; Pneumonia (01/02/2012; ? 2009); GERD (gastroesophageal reflux disease); Anemia; Diverticulitis of colon with perforation (01/14/2012); CAP (community acquired pneumonia) (01/02/2012); Intra-abdominal abscess (01/14/2012); Pneumatosis of intestines (01/14/2012); UTI (lower urinary tract infection) (01/03/2012); Bladder mass; History of kidney stones; Difficulty sleeping; Goiter; and Hypothyroidism.    PAST SURGICAL HISTORY: Past Surgical History  Procedure Laterality Date  . Cataract extraction w/ intraocular lens  implant, bilateral  ?1990's    Bil  . Appendectomy  1980's    "when they did hysterectomy" (01/02/2012)  . Abdominal hysterectomy  1980's  . Colostomy revision N/A 03/12/2012    Procedure: COLON RESECTION SIGMOID ;  Surgeon: Ralene Ok, MD;  Location: [facility];  Service: General;  Laterality: N/A;  . Colostomy N/A 03/12/2012    Procedure: COLOSTOMY;  Surgeon: Ralene Ok, MD;  Location: [facility];  Service: General;  Laterality: N/A;  . Colostomy takedown  N/A 10/21/2012    Procedure: LAPAROSCOPIC COLOSTOMY TAKEDOWN AND HARTMANS ANASTOMOSIS, rigid proctoscopy;  Surgeon: Ralene Ok, MD;   Location: [facility];  Service: General;  Laterality: N/A;  . Lysis of adhesion N/A 10/21/2012    Procedure: LYSIS OF ADHESION;  Surgeon: Ralene Ok, MD;  Location: [facility];  Service: General;  Laterality: N/A;  . Transurethral resection of bladder tumor with gyrus (turbt-gyrus) N/A 06/30/2013    Procedure: TRANSURETHRAL RESECTION OF BLADDER TUMOR WITH GYRUS (TURBT-GYRUS)/VAGINAL BIOPSY;  Surgeon: Ardis Hughs, MD;  Location: [facility];  Service: Urology;  Laterality: N/A;    FAMILY HISTORY: family history includes Cancer in her sister; Heart disease in her mother and another family member.  SOCIAL HISTORY:  reports that she quit smoking about 22 years ago. Her smoking use included Cigarettes. She has a 4.2 pack-year smoking history. She has never used smokeless tobacco. She reports that she does not drink alcohol or use illicit drugs.  ALLERGIES: Indomethacin  MEDICATIONS:  Current Outpatient Prescriptions  Medication Sig Dispense Refill  . acetaminophen (TYLENOL) 500 MG tablet Take 1,000 mg by mouth every 6 (six) hours as needed (Pain).      Marland Kitchen albuterol (PROVENTIL HFA;VENTOLIN HFA) 108 (90 BASE) MCG/ACT inhaler Inhale 2 puffs into the lungs every 6 (six) hours as needed for wheezing.      Marland Kitchen aspirin 81 MG tablet Take 81 mg by mouth daily.      Marland Kitchen docusate sodium (COLACE) 100 MG capsule Take 1 capsule (100 mg total) by mouth 2 (two) times daily as needed (take to keep stool soft.).  60 capsule  0  . furosemide (LASIX) 80 MG tablet Take 40 mg by mouth daily.      Marland Kitchen lisinopril (PRINIVIL,ZESTRIL) 20 MG tablet Take 10 mg by mouth every morning.       . magnesium citrate SOLN Take 1 Bottle by mouth as needed for severe constipation.      . magnesium hydroxide (MILK OF MAGNESIA) 400 MG/5ML suspension Take 15 mLs by mouth daily as needed for mild constipation.      Marland Kitchen morphine (MS CONTIN) 15 MG 12 hr tablet Take 1 tablet (15 mg total) by mouth every 12 (twelve) hours.  50 tablet  0  . oxyCODONE  (ROXICODONE) 5 MG immediate release tablet Take 1 tablet (5 mg total) by mouth every 4 (four) hours as needed for breakthrough pain.  50 tablet  0  . polyethylene glycol (MIRALAX / GLYCOLAX) packet Take 17 g by mouth as needed.      . Vitamin D, Ergocalciferol, (DRISDOL) 50000 UNITS CAPS Take 50,000 Units by mouth every 14 (fourteen) days. Sunday      . polyvinyl alcohol (LIQUIFILM TEARS) 1.4 % ophthalmic solution Place 1 drop into both eyes as needed for dry eyes.       No current facility-administered medications for this encounter.    REVIEW OF SYSTEMS:  A 15 point review of systems is documented in the electronic medical record. This was obtained by the nursing staff. However, I reviewed this with the patient to discuss relevant findings and make appropriate changes.  She continues to have pain in the lower pelvis region and dysuria. Her pain is under better control after being on MS Contin and oxycodone. She denies any hematuria vaginal bleeding, rectal bleeding. Patient denies any urinary incontinence.   PHYSICAL EXAM:  height is 5' (1.524 m) and weight is 147 lb 6.4 oz (66.86 kg). Her oral temperature is 98.6 F (37  C). Her blood pressure is 142/87 and her pulse is 65. Her respiration is 22 and oxygen saturation is 98%.   BP 142/87  Pulse 65  Temp(Src) 98.6 F (37 C) (Oral)  Resp 22  Ht 5' (1.524 m)  Wt 147 lb 6.4 oz (66.86 kg)  BMI 28.79 kg/m2  SpO2 98%  General Appearance:    Alert, cooperative, no distress, appears stated age, accompanied by her daughter on evaluation today   Head:    Normocephalic, without obvious abnormality, atraumatic  Eyes:    PERRL, conjunctiva/corneas clear, EOM's intact,      Ears:    Normal TM's and external ear canals, both ears  Nose:   Nares normal, septum midline, mucosa normal, no drainage    or sinus tenderness  Throat:   Lips, mucosa, and tongue normal; dentures in place, gums normal  Neck:   Supple, symmetrical, trachea midline, no adenopathy;      thyroid:  Significantly enlarged particularly along the right neck; no carotid   bruit or JVD  Back:     Symmetric, no curvature, ROM normal, no CVA tenderness  Lungs:     Clear to auscultation bilaterally, respirations unlabored  Chest Wall:    No tenderness or deformity   Heart:    Regular rate and rhythm, S1 and S2 normal, no murmur, rub   or gallop     Abdomen:     Soft, non-tender, bowel sounds active all four quadrants,    no masses, no organomegaly, multiple scars present   Genitalia:    external genitalia unremarkable. On digital examination the patient has a palpable mass centrally extends from the mid vagina to the vaginal apex, anteriorly located. No obvious mucosal involvement by this mass.   Rectal:    Normal tone,  no masses or tenderness;     Extremities:   Extremities normal, atraumatic, no cyanosis or edema  Pulses:   2+ and symmetric all extremities  Skin:   Skin color, texture, turgor normal, no rashes or lesions  Lymph nodes:   Cervical, supraclavicular, and axillary nodes normal, no inguinal adenopathy   Neurologic:   normal strength, sensation and reflexes    throughout     ECOG = 1    1 - Symptomatic but completely ambulatory (Restricted in physically strenuous activity but ambulatory and able to carry out work of a light or sedentary nature. For example, light housework, office work)  LABORATORY DATA:  Lab Results  Component Value Date   WBC 8.1 06/24/2013   HGB 12.1 06/24/2013   HCT 36.1 06/24/2013   MCV 89.1 06/24/2013   PLT 296 06/24/2013   NEUTROABS 7.3 03/24/2012   Lab Results  Component Value Date   NA 146 06/24/2013   K 4.5 06/24/2013   CL 107 06/24/2013   CO2 28 06/24/2013   GLUCOSE 106* 06/24/2013   CREATININE 0.81 06/24/2013   CALCIUM 9.3 06/24/2013      RADIOGRAPHY: Nm Pet Image Initial (pi) Skull Base To Thigh  07/14/2013   CLINICAL DATA:  Initial treatment strategy for cervical carcinoma.  EXAM: NUCLEAR MEDICINE PET SKULL BASE TO THIGH   TECHNIQUE: 7.3 mCi F-18 FDG was injected intravenously. Full-ring PET imaging was performed from the skull base to thigh after the radiotracer. CT data was obtained and used for attenuation correction and anatomic localization.  FASTING BLOOD GLUCOSE:  Value: 102 mg/dl  COMPARISON:  MRI on 05/21/2013 and CT on 03/30/2013  FINDINGS: NECK  No hypermetabolic lymph nodes in  the neck. A large multinodular goiter is seen. Hypermetabolic nodule measuring 5 cm is seen arising from the superior aspect of the right thyroid lobe. This has an SUV max of 17.0. Thyroid carcinoma cannot be excluded.  CHEST  No hypermetabolic mediastinal or hilar nodes. No suspicious pulmonary nodules on the CT scan.  ABDOMEN/PELVIS  No abnormal hypermetabolic activity within the liver, pancreas, adrenal glands, or spleen.  A hypermetabolic mass is seen involving the vaginal cuff and posterior bladder base. This mass measures approximately 4.9 cm in diameter. Hypermetabolic activity within this mass cannot be separated from urine activity in the adjacent urinary bladder, however SUV max measures 88. This is consistent with carcinoma.  A small less than 1 cm hypermetabolic lymph node is seen in the right internal iliac chain with SUV max of 7.25. A small less than 1 cm hypermetabolic lymph node is also seen in the left internal iliac chain with SUV max of 3.3. Both of these are consistent with metastatic disease. No other hypermetabolic adenopathy seen within the pelvis or abdomen.  Several small periumbilical and suprapubic anterior abdominal wall hernias are also seen containing small bowel as well as fat. No evidence of small bowel obstruction.  SKELETON  No focal hypermetabolic activity to suggest skeletal metastasis.  IMPRESSION: 5 cm hypermetabolic mass involving the vaginal cuff and posterior bladder base, consistent with malignancy.  Small less than 1 cm hypermetabolic pelvic lymph nodes in the internal iliac chains bilaterally, consistent  with metastatic disease.  No evidence of metastatic disease within the abdomen, chest, or neck.  Multinodular goiter, with 5 cm hypermetabolic mass in the superior right thyroid lobe. Thyroid carcinoma cannot be excluded. Thyroid ultrasound and percutaneous needle biopsy is recommended.   Electronically Signed   By: Earle Gell M.D.   On: 07/14/2013 13:51      IMPRESSION: Poorly differentiated squamous cell carcinoma of unclear origin. Patient would be a good candidate for an aggressive course of radiation therapy directed at the pelvic mass and associated lymphadenopathy. Unsure whether the patient would be a candidate for radiosensitizing chemotherapy but will make referral to Dr. Marko Plume.  PLAN: Simulation and planning later this morning. Patient will be treated with intensity modulated radiation therapy given her prior hysterectomy with small bowel in the pelvic field and to allow for a simultaneous integrated boost technique. The pelvic mass and associated lymphadenopathy will receive a dose of approximately 55 gray with a simultaneous integrated boost technique.   I spent 60 minutes minutes face to face with the patient and her daughter,  more than 50% of that time was spent in counseling and/or coordination of care.   ------------------------------------------------  Blair Promise, PhD, MD

## 2013-07-31 ENCOUNTER — Telehealth: Payer: Self-pay

## 2013-07-31 NOTE — Progress Notes (Signed)
  Radiation Oncology         [phone] ________________________________  Name: [name] MRN: [id number]  Date: 07/29/2013  DOB: September 02, 1925  SIMULATION AND TREATMENT PLANNING NOTE  DIAGNOSIS:  Squamous cell carcinoma of unknown origin.   NARRATIVE:  The patient was brought to the [facility].  Identity was confirmed.  All relevant records and images related to the planned course of therapy were reviewed.  The patient freely provided informed written consent to proceed with treatment after reviewing the details related to the planned course of therapy. The consent form was witnessed and verified by the simulation staff.  Then, the patient was set-up in a stable reproducible  supine position for radiation therapy.  CT images were obtained.  Surface markings were placed.  The CT images were loaded into the planning software.  Then the target and avoidance structures were contoured.  Treatment planning then occurred.  The radiation prescription was entered and confirmed.  Then, I designed and supervised the construction of a total of 1 medically necessary complex treatment devices.  I have requested : Intensity Modulated Radiotherapy (IMRT) is medically necessary for this case for the following reason:  Small bowel sparing..  I have ordered:dose calc.  PLAN:  The patient will receive 45 Gy in 25 fractions with a simultaneous integrated boost to the gross tumor volumes of 55 Gy.  ________________________________  -----------------------------------  Blair Promise, PhD, MD

## 2013-07-31 NOTE — Telephone Encounter (Signed)
Left voice message with [facility] new patient scheduler regarding referral  made by Dr. Sondra Come to Dr.Livesay.diagnosis of squamous cell ca (pelvis).

## 2013-08-03 ENCOUNTER — Telehealth: Payer: Self-pay | Admitting: Oncology

## 2013-08-03 NOTE — Telephone Encounter (Signed)
S/W PATIENT AND GAVE NP APPT FOR 08/14 3 W/DR. LIVEASY.  REFERRING DR. KINARD DX- SQUAMOUS CELL CARCINOMA OF UNKNOWN ORGIN

## 2013-08-04 ENCOUNTER — Telehealth: Payer: Self-pay | Admitting: Oncology

## 2013-08-04 NOTE — Addendum Note (Signed)
Encounter addended by: Jacqulyn Liner, RN on: 08/04/2013  3:24 PM<BR>     Documentation filed: Visit Diagnoses

## 2013-08-04 NOTE — Telephone Encounter (Signed)
C/D 07/31/13 for appt. 08/14/13

## 2013-08-05 DIAGNOSIS — Z510 Encounter for antineoplastic radiation therapy: Secondary | ICD-10-CM | POA: Diagnosis not present

## 2013-08-06 DIAGNOSIS — Z510 Encounter for antineoplastic radiation therapy: Secondary | ICD-10-CM | POA: Diagnosis not present

## 2013-08-10 ENCOUNTER — Ambulatory Visit
Admission: RE | Admit: 2013-08-10 | Discharge: 2013-08-10 | Disposition: A | Discharge status: Home / Self Care | Payer: Medicare Other | Source: Ambulatory Visit | Attending: Radiation Oncology | Admitting: Radiation Oncology

## 2013-08-10 DIAGNOSIS — Z510 Encounter for antineoplastic radiation therapy: Secondary | ICD-10-CM | POA: Diagnosis not present

## 2013-08-11 ENCOUNTER — Ambulatory Visit
Admission: RE | Admit: 2013-08-11 | Discharge: 2013-08-11 | Disposition: A | Discharge status: Home / Self Care | Payer: Medicare Other | Source: Ambulatory Visit | Attending: Radiation Oncology | Admitting: Radiation Oncology

## 2013-08-11 ENCOUNTER — Other Ambulatory Visit: Payer: Self-pay | Admitting: Oncology

## 2013-08-11 DIAGNOSIS — Z510 Encounter for antineoplastic radiation therapy: Secondary | ICD-10-CM | POA: Diagnosis not present

## 2013-08-11 DIAGNOSIS — C4492 Squamous cell carcinoma of skin, unspecified: Secondary | ICD-10-CM

## 2013-08-11 MED ORDER — PHENAZOPYRIDINE HCL 100 MG PO TABS: 100.0000 mg | ORAL_TABLET | Freq: Three times a day (TID) | ORAL | Status: DC

## 2013-08-11 NOTE — Progress Notes (Signed)
  Radiation Oncology         [phone] ________________________________  Name: MATRICE HERRO MRN: [id number]  Date: 08/11/2013  DOB: 08-05-1925  Weekly Radiation Therapy Management  DIAGNOSIS:  Squamous cell carcinoma of unknown presenting in the bladder/anterior vaginal region   Current Dose: 4.4 Gy     Planned Dose:  55 Gy  Narrative . . . . . . . . The patient presents for routine under treatment assessment.                                   The patient continues to have pain in the lower pelvis/vaginal area particularly with urination. I did give the patient a prescription for Pyridium in addition to her narcotics                                 Set-up films were reviewed. Vital signs stable                                The lungs are clear. The heart has regular rhythm and rate. The abdomen is soft and nontender with normal bowel sounds. Impression . . . . . . . The patient is tolerating radiation. Plan . . . . . . . . . . . . Continue treatment as planned.  ________________________________   Blair Promise, PhD, MD

## 2013-08-11 NOTE — Progress Notes (Signed)
Weekly rad txs 2/25 lower pelvis, c/o pain 6/10, pelvis/vagina area,  had normal bowel movement this am, says OXY IR 5mg  q 4h isn't lasting as long helping her pain level satted, asking for something stropnger, appetite poor,  Will start liquid supplements high calorie drinks stated, patient education, radiation therapy and you book given, discussed side effects, nausea,vomiting, diarrhea,skin irritation, dysuria freq, urgency, pt c/o also of dysuria had samples from [facility] believe pyridium, ran out, no discharge or hematuria,  Fatigued 10:23 AM

## 2013-08-12 ENCOUNTER — Ambulatory Visit
Admission: RE | Admit: 2013-08-12 | Discharge: 2013-08-12 | Disposition: A | Discharge status: Home / Self Care | Payer: Medicare Other | Source: Ambulatory Visit | Attending: Radiation Oncology | Admitting: Radiation Oncology

## 2013-08-12 ENCOUNTER — Ambulatory Visit: Payer: Medicare Other

## 2013-08-12 ENCOUNTER — Ambulatory Visit: Payer: Medicare Other | Admitting: Radiation Oncology

## 2013-08-12 DIAGNOSIS — Z510 Encounter for antineoplastic radiation therapy: Secondary | ICD-10-CM | POA: Diagnosis not present

## 2013-08-13 ENCOUNTER — Telehealth: Payer: Self-pay | Admitting: Cardiovascular Disease

## 2013-08-13 ENCOUNTER — Ambulatory Visit
Admission: RE | Admit: 2013-08-13 | Discharge: 2013-08-13 | Disposition: A | Discharge status: Home / Self Care | Payer: Medicare Other | Source: Ambulatory Visit | Attending: Radiation Oncology | Admitting: Radiation Oncology

## 2013-08-13 DIAGNOSIS — Z510 Encounter for antineoplastic radiation therapy: Secondary | ICD-10-CM | POA: Diagnosis not present

## 2013-08-13 NOTE — Telephone Encounter (Signed)
I spoke with Meg and made her aware of last EF on 03/2012 [facility].

## 2013-08-13 NOTE — Telephone Encounter (Signed)
New message    [employer] calling to check on the most recent ejection fraction % .  - heart failure program .

## 2013-08-14 ENCOUNTER — Ambulatory Visit: Payer: Medicare Other

## 2013-08-14 ENCOUNTER — Encounter: Payer: Self-pay | Admitting: Oncology

## 2013-08-14 ENCOUNTER — Telehealth: Payer: Self-pay | Admitting: Oncology

## 2013-08-14 ENCOUNTER — Ambulatory Visit
Admission: RE | Admit: 2013-08-14 | Discharge: 2013-08-14 | Disposition: A | Discharge status: Home / Self Care | Payer: Medicare Other | Source: Ambulatory Visit | Attending: Radiation Oncology | Admitting: Radiation Oncology

## 2013-08-14 ENCOUNTER — Ambulatory Visit (HOSPITAL_BASED_OUTPATIENT_CLINIC_OR_DEPARTMENT_OTHER): Payer: Medicare Other | Admitting: Oncology

## 2013-08-14 ENCOUNTER — Other Ambulatory Visit (HOSPITAL_BASED_OUTPATIENT_CLINIC_OR_DEPARTMENT_OTHER): Payer: Medicare Other

## 2013-08-14 VITALS — BP 136/58 | HR 72 | Temp 98.0°F | Resp 20 | Ht 60.0 in | Wt 144.1 lb

## 2013-08-14 DIAGNOSIS — C4492 Squamous cell carcinoma of skin, unspecified: Secondary | ICD-10-CM

## 2013-08-14 DIAGNOSIS — Z510 Encounter for antineoplastic radiation therapy: Secondary | ICD-10-CM | POA: Diagnosis not present

## 2013-08-14 DIAGNOSIS — R1900 Intra-abdominal and pelvic swelling, mass and lump, unspecified site: Secondary | ICD-10-CM

## 2013-08-14 DIAGNOSIS — C801 Malignant (primary) neoplasm, unspecified: Secondary | ICD-10-CM

## 2013-08-14 LAB — COMPREHENSIVE METABOLIC PANEL (CC13)
ALT: 7 U/L (ref 0–55)
ANION GAP: 8 meq/L (ref 3–11)
AST: 12 U/L (ref 5–34)
Albumin: 3.1 g/dL — ABNORMAL LOW (ref 3.5–5.0)
Alkaline Phosphatase: 68 U/L (ref 40–150)
BILIRUBIN TOTAL: 0.98 mg/dL (ref 0.20–1.20)
BUN: 24.9 mg/dL (ref 7.0–26.0)
CO2: 27 mEq/L (ref 22–29)
Calcium: 9 mg/dL (ref 8.4–10.4)
Chloride: 100 mEq/L (ref 98–109)
Creatinine: 1.2 mg/dL — ABNORMAL HIGH (ref 0.6–1.1)
GLUCOSE: 109 mg/dL (ref 70–140)
Potassium: 4.6 mEq/L (ref 3.5–5.1)
Sodium: 135 mEq/L — ABNORMAL LOW (ref 136–145)
Total Protein: 6.9 g/dL (ref 6.4–8.3)

## 2013-08-14 LAB — CBC WITH DIFFERENTIAL/PLATELET
BASO%: 0.6 % (ref 0.0–2.0)
Basophils Absolute: 0.1 10*3/uL (ref 0.0–0.1)
EOS ABS: 0.3 10*3/uL (ref 0.0–0.5)
EOS%: 2 % (ref 0.0–7.0)
HEMATOCRIT: 34.9 % (ref 34.8–46.6)
HGB: 11 g/dL — ABNORMAL LOW (ref 11.6–15.9)
LYMPH%: 5.7 % — AB (ref 14.0–49.7)
MCH: 28.2 pg (ref 25.1–34.0)
MCHC: 31.4 g/dL — ABNORMAL LOW (ref 31.5–36.0)
MCV: 89.9 fL (ref 79.5–101.0)
MONO#: 1 10*3/uL — ABNORMAL HIGH (ref 0.1–0.9)
MONO%: 6.7 % (ref 0.0–14.0)
NEUT%: 85 % — AB (ref 38.4–76.8)
NEUTROS ABS: 12.6 10*3/uL — AB (ref 1.5–6.5)
PLATELETS: 324 10*3/uL (ref 145–400)
RBC: 3.88 10*6/uL (ref 3.70–5.45)
RDW: 12 % (ref 11.2–14.5)
WBC: 14.8 10*3/uL — ABNORMAL HIGH (ref 3.9–10.3)
lymph#: 0.8 10*3/uL — ABNORMAL LOW (ref 0.9–3.3)

## 2013-08-14 MED ORDER — OXYCODONE HCL 5 MG PO TABS: 5.0000 mg | ORAL_TABLET | ORAL | Status: DC | PRN

## 2013-08-14 MED ORDER — ONDANSETRON 8 MG PO TBDP: 8.0000 mg | ORAL_TABLET | Freq: Two times a day (BID) | ORAL | Status: DC | PRN

## 2013-08-14 NOTE — Patient Instructions (Signed)
Please try miralax (glycolax) one capful in liquid once or twice daily to keep bowels moving daily  Metamucil sometimes makes you more bloated with the pain medicine, so you may not need this with miralax You can use milk of Mag also, but you may not need it with miralax  Increase the MS Contin to two of the 15 mg tablets morning and evening. If this dose makes you more comfortable, the next prescription can be written for 30 mg twice daily You can still use oxycodone 5 mg every 4 hours if needed for pain.  We will send prescription for zofran (ondansetron) ODT rapid dissolving tablets, 8 mg every 12 hours as needed for nausea.  Please try Boost Breeze or Resource (juice-type supplement drinks) ~ 3 daily if you are really not eating well.  Radiation Oncology can give you the sitz bath - you can pick it up today from Dr Clabe Seal nurse [place] [facility].

## 2013-08-14 NOTE — Telephone Encounter (Signed)
per pof to sch pt appt-sch and gave pt copy of sch °

## 2013-08-14 NOTE — Progress Notes (Signed)
Checked in new pt with no financial concerns. °

## 2013-08-14 NOTE — Progress Notes (Signed)
[facility] NEW PATIENT EVALUATION   Name: [name] Date: 08/14/2013 MRN: [id number] DOB: [birth year]  REFERRING PHYSICIAN: Gery Pray Cc Everitt Amber, Claris Gower, Ileene Hutchinson, Farris Has  Information reviewed from EMR and other subspecialties, as well as other pertient medical information from EMR information.  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 female who is seen in consultation, together with daughter, at the request of Dr Sondra Come, for consideration of sensitizing chemotherapy with radiation for recently diagnosed squamous cell carcinoma involving lower pelvis.  Patient believes that symptoms of dysuria and pelvic pain began ~ Jan 2015. She had CT AP 03-30-13 in [facility] system, which showed some abnormality at vaginal cuff / bladder base. She had pelvic US 04-13-13 which showed masslike appearance of probable cervical remnant. MRI pelvis 05-21-13 identified 2.7 x 4 x 3.8 cm mass at vaginal cuff, possibly invading posterior wall of bladder, with no adenopathy or significant fluid collection. She was seen by Dr Louis Meckel of urology, with cystoscopy 06-30-13 showing 3 cm bladder mass near trigone, and exam under anesthesia also showed a nodular firm mass at anterior vaginal wall; biopsy of the bladder showed invasive poorly differentiated carcinoma suggestive of squamous carcinoma, and biopsy of vaginal mass also poorly differentiated carcinoma. PET 07-14-13 had 5 cm hypermetabolic mass involving vaginal cuff and posterior bladder base and hypermetabolic internal iliac nodes bilaterally, without evidence of more distant metastatic involvement.  She was seen by Dr Denman George on 07-24-13, with finding of bladder with palpable mass centrally from mid vagina to vaginal apex, minimally mobile. Per Dr  Denman George, patient is not surgical candidate for what would require anterior exenteration and urinary diversion due to tumor locally extensive and metastatic on PET to pelvic nodes such the procedure would not be curative, and fact that she is poor surgical candidate due to age, prior surgeries and comorbidities. Dr Denman George thought possible that the cancer arose from the deep margin of a small cervical remnant, given completely normal vaginal mucosa at her exam. Dr Denman George recommended radiation therapy, and was concerned that patient likely would not tolerate concomitant chemotherapy. She saw Dr Sondra Come in consultation on 07-29-13 and IMRT is to begin 08-17-13.  Patient has been on MS Contin 15 mg bid from Dr Louis Meckel for ~ 4 weeks, but still needing oxycodone 5 mg every 4 hrs RTC without adequate control of pelvic pain. She has been extremely constipated, tho bowels are moving some better now with MOM and metamucil, including a bowel movement this am. She is able to void. She can sleep only for short intervals due to pain. Appetite is poor and we have discussed Boost Breeze or Resource, which she had during previous hospitalization. She has some nausea.    REVIEW OF SYSTEMS as above, also: No HA or neurologic changes. Good visual acuity with glasses. No environmental allergies. Full dentures. No chest pain. SOB at baseline. Some arthritis LE. No bleeding. No LE swelling. Minimal GERD.  Remainder of full 10 point review of systems negative.   ALLERGIES: Indomethacin  PAST MEDICAL/ SURGICAL HISTORY:    HTN COPD CHF. Echocardiogram 03-2012 with EF 40-45% Diverticulitis requiring sigmoid resection with colostomy 04-2012, then colostomy reversal by DR Rameriz 2014 Hysterectomy 1980s Appendectomy Goiter/ hypothyroid Nephrolithiasis once Anemia TUR bladder Dr Louis Meckel [phone]  CURRENT MEDICATIONS: reviewed as listed now in EMR. Patient  is instructed to increase MS Contin to 30 mg bid using the 15 mg tablets  already available, and prescription for oxycodone 5 mg rewritten. Recommended Miralax one capful once or twice daily instead of MOM/ metamucil. Prescription for zofran ODT. She is on 80 mg lasix daily and albuterol inhaler NOTE she does not swallow pills well, prefers liquids or other.   SOCIAL HISTORY:  Originally from [place] [facility], lives alone in [place]. 2 daughters, 4 sons, grands and great-grands. Minimal past tobacco DCd 22 years ago. No ETOH.   FAMILY HISTORY:  Breast cancer in sister in her 60s Heart disease in mother "died young" diabetes          PHYSICAL EXAM:  height is 5' (1.524 m) and weight is 144 lb 1.6 oz (65.363 kg). Her oral temperature is 98 F (36.7 C). Her blood pressure is 136/58 and her pulse is 72. Her respiration is 20 and oxygen saturation is 96%.  Alert, pleasant, cooperative elderly lady, looks stated age. Appears fatigued and moderately uncomfortable. SOB getting on and off exam table. Daughter very supportive.  HEENT: normal hair pattern. PERRL, not icteric. Full dentures. Oral mucosa moist and clear, posterior pharynx likewise  RESPIRATORY: lungs with somewhat diminished BS thruout, no wheezes or rales, no dullness to percussion, no use of accessory muscles  CARDIAC/ VASCULAR: heart RRR no gallop. Clear heart sounds. Peripheral pulses symmetrical  ABDOMEN: soft, nontender, not distended, diminished BS. No appreciable mass or HSM. Surgical incisions healed, midline and ostomy on left. Apparent ventral hernia, soft, reduces, not tender, 4-5 cm diameter.  LYMPH NODES: no cervical, supraclavicular, axillary or inguinal adenopathy.  BREASTS:bilaterally without dominant mass, skin or nipple findings  NEUROLOGIC: speech fluent and appropriate. CN, motor, sensory, cerebellar nonfocal. PSYCH appropriate mood and affect  SKIN: no rash, ecchymoses, petechiae  MUSCULOSKELETAL: Back not tender. LE without pitting edema, cords,  tenderness    LABORATORY DATA:  Results for orders placed in visit on 08/14/13 (from the past 48 hour(s))  CBC WITH DIFFERENTIAL     Status: Abnormal   Collection Time    08/14/13  3:07 PM      Result Value Ref Range   WBC 14.8 (*) 3.9 - 10.3 10e3/uL   NEUT# 12.6 (*) 1.5 - 6.5 10e3/uL   HGB 11.0 (*) 11.6 - 15.9 g/dL   HCT 34.9  34.8 - 46.6 %   Platelets 324  145 - 400 10e3/uL   MCV 89.9  79.5 - 101.0 fL   MCH 28.2  25.1 - 34.0 pg   MCHC 31.4 (*) 31.5 - 36.0 g/dL   RBC 3.88  3.70 - 5.45 10e6/uL   RDW 12.0  11.2 - 14.5 %   lymph# 0.8 (*) 0.9 - 3.3 10e3/uL   MONO# 1.0 (*) 0.1 - 0.9 10e3/uL   Eosinophils Absolute 0.3  0.0 - 0.5 10e3/uL   Basophils Absolute 0.1  0.0 - 0.1 10e3/uL   NEUT% 85.0 (*) 38.4 - 76.8 %   LYMPH% 5.7 (*) 14.0 - 49.7 %   MONO% 6.7  0.0 - 14.0 %   EOS% 2.0  0.0 - 7.0 %   BASO% 0.6  0.0 - 2.0 %  COMPREHENSIVE METABOLIC PANEL (ST41)     Status: Abnormal   Collection Time    08/14/13  3:07 PM      Result Value Ref Range   Sodium 135 (*) 136 - 145 mEq/L   Potassium 4.6  3.5 - 5.1 mEq/L   Chloride 100  98 - 109 mEq/L   CO2 27  22 - 29 mEq/L   Glucose 109  70 - 140 mg/dl   BUN 24.9  7.0 - 26.0 mg/dL   Creatinine 1.2 (*) 0.6 - 1.1 mg/dL   Total Bilirubin 0.98  0.20 - 1.20 mg/dL   Alkaline Phosphatase 68  40 - 150 U/L   AST 12  5 - 34 U/L   ALT 7  0 - 55 U/L   Total Protein 6.9  6.4 - 8.3 g/dL   Albumin 3.1 (*) 3.5 - 5.0 g/dL   Calcium 9.0  8.4 - 10.4 mg/dL   Anion Gap 8  3 - 11 mEq/L      PATHOLOGY: RENU, ASBY Collected: 06/30/2013 Client: [facility] Accession: [id number] Received: 06/30/2013 Louis Meckel, MDORT OFURGICALPATHOLOGY FINAL DIAGNOSIS Diagnosis 1. Vagina, biopsy, Anterior wall - INVASIVE POORLY DIFFERENTIATED CARCINOMA, PLEASE SEE COMMENT. 2. Bladder, biopsy, posterior wall - INVASIVE POORLY DIFFERENTIATED CARCINOMA. Microscopic Comment 1. , 2. Sections show invasive poorly differentiated carcinoma. Immunohistochemical  stains were performed and the malignant cells are strongly positive for squamous cell carcinoma markers cytokeratin 5/6 and p63, positive for cytokeratin 7 and cytokeratin 903, negative for cytokeratin 20 and p16 (high risk HPV marker). The control stained appropriately. The overall findings are diagnostic for invasive poorly differentiated carcinoma and the phenotype is strongly suggestive of squamous cell carcinoma. The main considerations include invasive squamous cell carcinoma of bladder, invasive urothelial carcinoma with squamous differentiation and invasive squamous cell carcinoma of cervical/vaginal primary. Given the negative staining for p16, the tumor is less likely to be a HPV related squamous cell carcinoma of cervical/vaginal primary. Clinical and radiographic correlation is highly recommended.  RADIOGRAPHY: CT ABDOMEN AND PELVIS WITH CONTRAST 03-30-13  COMPARISON: 03/07/2012 CT.  FINDINGS:  Periumbilical anterior abdominal/pelvic wall hernia contains small  bowel (centrally and to the right) and fat containing hernia (left  of midline). No findings to suggest this is causing of bowel  obstruction.  Change in appearance at the interface of what appears to be the  vaginal cuff and bladder base. A mass at this level cannot be  excluded. Clinical correlation as well as laboratory correlation  (urinalysis for blood/cytology) recommended.  Very small amount of free fluid right aspect of the pelvis. Etiology  indeterminate. No focal extra luminal bowel inflammatory process or  free intraperitoneal air.  Lung bases clear. Coronary artery calcifications. Heart size within  normal limits.  Atherosclerotic type changes of the abdominal aorta with ectasia  without focal aneurysm. Atherosclerotic type changes origin of the  is celiac artery, mid superior mesenteric artery, iliac arteries and  femoral arteries.  No worrisome focal hepatic lesion. Right lobe of liver is slightly   elongated. No calcified gallstones.  Calcified ectatic splenic artery. No pancreatic mass noted. No  splenic lesion. Mild hyperplasia of the adrenal glands. Bilateral  renal lesions some of which are clearly cysts and others too small  to adequately characterize.  Degenerative changes thoracic and lumbar spine. Mild hip joint  degenerative changes.  No adenopathy.  IMPRESSION:  Periumbilical anterior abdominal/pelvic wall hernia contains small  bowel (centrally and to the right) and fat containing hernia (left  of midline). No findings to suggest this is causing of bowel  obstruction at the current time.  Change in appearance at the interface of what appears to be the  vaginal cuff and bladder base. A mass at this level cannot be  excluded. Clinical correlation as well as laboratory correlation  (  urinalysis for blood/cytology) recommended.  Very small amount of free fluid right aspect of the pelvis. Etiology  indeterminate. No focal extra luminal bowel inflammatory process or  free intraperitoneal air.  Atherosclerotic type changes as detailed above.  Bilateral renal lesions some of which are clearly cysts and others  too small to adequately characterize.    TRANSABDOMINAL AND TRANSVAGINAL ULTRASOUND OF PELVIS  DOPPLER ULTRASOUND OF OVARIES    04-13-13 TECHNIQUE:  Both transabdominal and transvaginal ultrasound examinations of the  pelvis were performed. Transabdominal technique was performed for  global imaging of the pelvis including uterus, ovaries, adnexal  regions, and pelvic cul-de-sac.  It was necessary to proceed with endovaginal exam following the  transabdominal exam to visualize the ovaries and vaginal cuff. Color  and duplex Doppler ultrasound was utilized to evaluate blood flow to  the ovaries.  COMPARISON: CT ABD/PELVIS W CM dated 03/30/2013; CT ABD/PELVIS W CM  dated 03/07/2012  FINDINGS:  Uterus  Measurements: Surgically absent. Masslike appearance to the  vaginal  cuff versus remnant cervix measuring 3.7 x 3.6 x 2.9 cm.  Right ovary  Measurements: Not visualized due to bowel gas. No adnexal mass.  Left ovary  Measurements: Not visualized due to bowel gas. No adnexal mass.  Pulsed Doppler evaluation of both ovaries demonstrates  nonvisualization of the ovaries bilaterally.  Other findings  No free fluid.  IMPRESSION:  Masslike appearance at the probable cervical remnant. There is a  suggestion of linear fluid within the presumed lower uterine  segment/ cervical canal remnant. Pap smear is recommended for  further evaluation. Masslike appearance to the vaginal cuff is  possible but considered less likely.     MRI PELVIS WITHOUT AND WITH CONTRAST  05-21-13  COMPARISON: CT abdomen pelvis from 03/30/2013. Pelvic ultrasound  from 04/13/2013  FINDINGS:  Enhancing mass originating from the vaginal cuff is identified. This  has mass effect and/or may be invading the posterior wall of the  bladder. This measures 2.7 x 4.0 x 3.8 cm, image 20/series 12.  There is no pelvic or inguinal adenopathy identified. There is no  significant free fluid or abnormal fluid collections identified.  Normal signal is identified from within the bone marrow.  IMPRESSION:  1. Mass arising from the vaginal cuff is identified and is  concerning for tumor recurrence. This has mass effect and/or invades  the posterior wall of the urinary bladder. Correlation with tissue  sampling is recommended.      NUCLEAR MEDICINE PET SKULL BASE TO THIGH  07-14-13  COMPARISON: MRI on 05/21/2013 and CT on 03/30/2013  FINDINGS:  NECK  No hypermetabolic lymph nodes in the neck. A large multinodular  goiter is seen. Hypermetabolic nodule measuring 5 cm is seen arising  from the superior aspect of the right thyroid lobe. This has an SUV  max of 17.0. Thyroid carcinoma cannot be excluded.  CHEST  No hypermetabolic mediastinal or hilar nodes. No suspicious  pulmonary  nodules on the CT scan.  ABDOMEN/PELVIS  No abnormal hypermetabolic activity within the liver, pancreas,  adrenal glands, or spleen.  A hypermetabolic mass is seen involving the vaginal cuff and  posterior bladder base. This mass measures approximately 4.9 cm in  diameter. Hypermetabolic activity within this mass cannot be  separated from urine activity in the adjacent urinary bladder,  however SUV max measures 88. This is consistent with carcinoma.  A small less than 1 cm hypermetabolic lymph node is seen in the  right internal iliac chain with SUV max  of 7.25. A small less than 1  cm hypermetabolic lymph node is also seen in the left internal iliac  chain with SUV max of 3.3. Both of these are consistent with  metastatic disease. No other hypermetabolic adenopathy seen within  the pelvis or abdomen.  Several small periumbilical and suprapubic anterior abdominal wall  hernias are also seen containing small bowel as well as fat. No  evidence of small bowel obstruction.  SKELETON  No focal hypermetabolic activity to suggest skeletal metastasis.  IMPRESSION:  5 cm hypermetabolic mass involving the vaginal cuff and posterior  bladder base, consistent with malignancy.  Small less than 1 cm hypermetabolic pelvic lymph nodes in the  internal iliac chains bilaterally, consistent with metastatic  disease.  No evidence of metastatic disease within the abdomen, chest, or  neck.  Multinodular goiter, with 5 cm hypermetabolic mass in the superior  right thyroid lobe. Thyroid carcinoma cannot be excluded. Thyroid  ultrasound and percutaneous needle biopsy is recommended.        DISCUSSION: I have reviewed all of history above with patient and daughter now. I agree with Dr Denman George that she is not a candidate for sensitizing CDDP, particularly due to CHF and chronic renal disease. Patient tells me that she is relieved that chemotherapy is not recommended, as she did not want to take this.   Patient is anxious about the radiation but willing to try that. I have increased her MS Contin as above, and have asked daughter to let us know next week how she is with this, as she will need another prescription within the week. We have discussed bowel regimen and diet supplements. I will see her back in ~ 2 weeks to follow up this symptom management.  Written and oral instructions as follows: Please try miralax (glycolax) one capful in liquid once or twice daily to keep bowels moving daily  Metamucil sometimes makes you more bloated with the pain medicine, so you may not need this with miralax You can use milk of Mag also, but you may not need it with miralax  Increase the MS Contin to two of the 15 mg tablets morning and evening. If this dose makes you more comfortable, the next prescription can be written for 30 mg twice daily You can still use oxycodone 5 mg every 4 hours if needed for pain.  We will send prescription for zofran (ondansetron) ODT rapid dissolving tablets, 8 mg every 12 hours as needed for nausea.  Please try Boost Breeze or Resource (juice-type supplement drinks) ~ 3 daily if you are really not eating well.    IMPRESSION / PLAN:  1.metastatic squamous cell carcinoma from cervical remnant involving vaginal cuff, bladder trigone and bilateral internal iliac nodes, in 80 yo lady with significant cardiac and pulmonary disease: not candidate for surgical intervention or for sensitizing cisplatin chemotherapy. We appreciate Dr Clabe Seal treatment. Increase in pain meds and bowel program as noted. 2.cardiac disease with CHF, HTN known to Dr Acie Fredrickson 3. Chronic renal disease 4. COPD, past tobacco 5.post hysterectomy 1980s 6.hypothyroid 7.appendectomy 8.degenerative arthritis      Patient and daughter have had questions answered to their satisfaction and are in agreement with plan above. They can contact this office for questions or concerns at any time prior to next  scheduled visit.  Time spent  50 min, including >50% discussion and coordination of care.    Gordy Levan, MD 08/14/2013 4:56 PM

## 2013-08-17 ENCOUNTER — Ambulatory Visit
Admission: RE | Admit: 2013-08-17 | Discharge: 2013-08-17 | Disposition: A | Discharge status: Home / Self Care | Payer: Medicare Other | Source: Ambulatory Visit | Attending: Radiation Oncology | Admitting: Radiation Oncology

## 2013-08-17 DIAGNOSIS — Z510 Encounter for antineoplastic radiation therapy: Secondary | ICD-10-CM | POA: Diagnosis not present

## 2013-08-18 ENCOUNTER — Encounter: Payer: Self-pay | Admitting: Radiation Oncology

## 2013-08-18 ENCOUNTER — Ambulatory Visit
Admission: RE | Admit: 2013-08-18 | Discharge: 2013-08-18 | Disposition: A | Discharge status: Home / Self Care | Payer: Medicare Other | Source: Ambulatory Visit | Attending: Radiation Oncology | Admitting: Radiation Oncology

## 2013-08-18 VITALS — BP 126/64 | HR 71 | Temp 98.0°F | Resp 20 | Ht 60.0 in | Wt 146.0 lb

## 2013-08-18 DIAGNOSIS — C4492 Squamous cell carcinoma of skin, unspecified: Secondary | ICD-10-CM

## 2013-08-18 DIAGNOSIS — Z510 Encounter for antineoplastic radiation therapy: Secondary | ICD-10-CM | POA: Diagnosis not present

## 2013-08-18 MED ORDER — MORPHINE SULFATE ER 30 MG PO TBCR: 30.0000 mg | EXTENDED_RELEASE_TABLET | Freq: Two times a day (BID) | ORAL | Status: DC

## 2013-08-18 NOTE — Progress Notes (Signed)
  Radiation Oncology         ([phone] ________________________________  Name: [name] MRN: [id number]  Date: 08/18/2013  DOB: 07/24/1925  Weekly Radiation Therapy Management  Current Dose: 15.4 Gy     Planned Dose:  55 Gy  Narrative . . . . . . . . The patient presents for routine under treatment assessment.                                   The patient is without complaint for continued pain in the lower pelvis area. Her dysuria has been helped with pyridium.                                 Set-up films were reviewed.                                 The chart was checked. Physical Findings. . .  height is 5' (1.524 m) and weight is 146 lb (66.225 kg). Her oral temperature is 98 F (36.7 C). Her blood pressure is 126/64 and her pulse is 71. Her respiration is 20. . The lungs are clear. The heart has a regular rhythm and rate. The abdomen is soft and nontender with normal bowel sounds. Impression . . . . . . . The patient is tolerating radiation. Plan . . . . . . . . . . . . Continue treatment as planned.  ________________________________   Blair Promise, PhD, MD

## 2013-08-18 NOTE — Progress Notes (Addendum)
[name] has completed 7/25 fractions to her lower pelvis.  She denies pain and is taking Morphine 15 mg bid and oxycodone 1 tablet daily.  She reports having nausea in the mornings.  She is taking Zofran which helps.  She is taking miralax for her bowel movements.  She continues to have burning with urination.  She is taking pyridium as needed.  She reports her appetite is OK and her weight it up 2 lbs this week.  She denies vaginal/rectal bleeding, skin irritation and fatigue.

## 2013-08-19 ENCOUNTER — Ambulatory Visit
Admission: RE | Admit: 2013-08-19 | Discharge: 2013-08-19 | Disposition: A | Discharge status: Home / Self Care | Payer: Medicare Other | Source: Ambulatory Visit | Attending: Radiation Oncology | Admitting: Radiation Oncology

## 2013-08-19 DIAGNOSIS — Z510 Encounter for antineoplastic radiation therapy: Secondary | ICD-10-CM | POA: Diagnosis not present

## 2013-08-20 ENCOUNTER — Ambulatory Visit
Admission: RE | Admit: 2013-08-20 | Discharge: 2013-08-20 | Disposition: A | Discharge status: Home / Self Care | Payer: Medicare Other | Source: Ambulatory Visit | Attending: Radiation Oncology | Admitting: Radiation Oncology

## 2013-08-20 DIAGNOSIS — Z510 Encounter for antineoplastic radiation therapy: Secondary | ICD-10-CM | POA: Diagnosis not present

## 2013-08-21 ENCOUNTER — Ambulatory Visit
Admission: RE | Admit: 2013-08-21 | Discharge: 2013-08-21 | Disposition: A | Discharge status: Home / Self Care | Payer: Medicare Other | Source: Ambulatory Visit | Attending: Radiation Oncology | Admitting: Radiation Oncology

## 2013-08-21 DIAGNOSIS — Z510 Encounter for antineoplastic radiation therapy: Secondary | ICD-10-CM | POA: Diagnosis not present

## 2013-08-23 ENCOUNTER — Other Ambulatory Visit: Payer: Self-pay | Admitting: Oncology

## 2013-08-23 DIAGNOSIS — C4492 Squamous cell carcinoma of skin, unspecified: Secondary | ICD-10-CM

## 2013-08-24 ENCOUNTER — Ambulatory Visit
Admission: RE | Admit: 2013-08-24 | Discharge: 2013-08-24 | Disposition: A | Discharge status: Home / Self Care | Payer: Medicare Other | Source: Ambulatory Visit | Attending: Radiation Oncology | Admitting: Radiation Oncology

## 2013-08-24 DIAGNOSIS — Z510 Encounter for antineoplastic radiation therapy: Secondary | ICD-10-CM | POA: Diagnosis not present

## 2013-08-25 ENCOUNTER — Encounter: Payer: Self-pay | Admitting: Radiation Oncology

## 2013-08-25 ENCOUNTER — Ambulatory Visit
Admission: RE | Admit: 2013-08-25 | Discharge: 2013-08-25 | Disposition: A | Discharge status: Home / Self Care | Payer: Medicare Other | Source: Ambulatory Visit | Attending: Radiation Oncology | Admitting: Radiation Oncology

## 2013-08-25 VITALS — BP 133/59 | HR 62 | Resp 16 | Wt 146.5 lb

## 2013-08-25 DIAGNOSIS — Z510 Encounter for antineoplastic radiation therapy: Secondary | ICD-10-CM | POA: Diagnosis not present

## 2013-08-25 DIAGNOSIS — C4492 Squamous cell carcinoma of skin, unspecified: Secondary | ICD-10-CM

## 2013-08-25 MED ORDER — PHENAZOPYRIDINE HCL 100 MG PO TABS: 100.0000 mg | ORAL_TABLET | Freq: Three times a day (TID) | ORAL | Status: DC

## 2013-08-25 NOTE — Progress Notes (Signed)
  Radiation Oncology         [phone] ________________________________  Name: [name] MRN: [id number]  Date: 08/25/2013  DOB: 1925-08-11  Weekly Radiation Therapy Management  DIAGNOSIS: Squamous cell carcinoma of unknown primary presenting in the bladder/anterior vaginal region   Current Dose: 26.4 Gy     Planned Dose:  55 Gy  Narrative . . . . . . . . The patient presents for routine under treatment assessment.                                   The patient is doing well this week. Her dysuria is better with Pyridium. I did refill his medication for her. Patient denies any diarrhea or nausea.  Pelvic pain is well-controlled with her current pain medication regimen.                                 Set-up films were reviewed.                                 The chart was checked. Physical Findings. . .  weight is 146 lb 8 oz (66.452 kg). Her blood pressure is 133/59 and her pulse is 62. Her respiration is 16. . The lungs are clear. The heart has a regular rhythm and rate. The abdomen is soft and nontender with normal bowel sounds. Impression . . . . . . . The patient is tolerating radiation. Plan . . . . . . . . . . . . Continue treatment as planned.  ________________________________   Blair Promise, PhD, MD

## 2013-08-25 NOTE — Progress Notes (Signed)
Weight and vitals stable. Denies pain. Reports pain is well managed with prescribed Roxicodone and morphine. Reports fatigue is no worse than normal. Reports dysuria is better but, no completely resolved. Took last pyridium tablet yesterday. Denies diarrhea. Denies vaginal odor or discharge. Reports vaginal itching.

## 2013-08-26 ENCOUNTER — Ambulatory Visit
Admission: RE | Admit: 2013-08-26 | Discharge: 2013-08-26 | Disposition: A | Discharge status: Home / Self Care | Payer: Medicare Other | Source: Ambulatory Visit | Attending: Radiation Oncology | Admitting: Radiation Oncology

## 2013-08-26 DIAGNOSIS — Z510 Encounter for antineoplastic radiation therapy: Secondary | ICD-10-CM | POA: Diagnosis not present

## 2013-08-27 ENCOUNTER — Ambulatory Visit
Admission: RE | Admit: 2013-08-27 | Discharge: 2013-08-27 | Disposition: A | Discharge status: Home / Self Care | Payer: Medicare Other | Source: Ambulatory Visit | Attending: Radiation Oncology | Admitting: Radiation Oncology

## 2013-08-27 DIAGNOSIS — Z510 Encounter for antineoplastic radiation therapy: Secondary | ICD-10-CM | POA: Diagnosis not present

## 2013-08-28 ENCOUNTER — Ambulatory Visit
Admission: RE | Admit: 2013-08-28 | Discharge: 2013-08-28 | Disposition: A | Discharge status: Home / Self Care | Payer: Medicare Other | Source: Ambulatory Visit | Attending: Radiation Oncology | Admitting: Radiation Oncology

## 2013-08-28 ENCOUNTER — Telehealth: Payer: Self-pay | Admitting: Oncology

## 2013-08-28 ENCOUNTER — Ambulatory Visit (HOSPITAL_BASED_OUTPATIENT_CLINIC_OR_DEPARTMENT_OTHER): Payer: Medicare Other | Admitting: Oncology

## 2013-08-28 ENCOUNTER — Encounter: Payer: Self-pay | Admitting: Oncology

## 2013-08-28 ENCOUNTER — Other Ambulatory Visit (HOSPITAL_BASED_OUTPATIENT_CLINIC_OR_DEPARTMENT_OTHER): Payer: Medicare Other

## 2013-08-28 VITALS — BP 151/65 | HR 66 | Temp 98.0°F | Resp 18 | Ht 60.0 in | Wt 144.3 lb

## 2013-08-28 DIAGNOSIS — Z510 Encounter for antineoplastic radiation therapy: Secondary | ICD-10-CM | POA: Diagnosis not present

## 2013-08-28 DIAGNOSIS — C775 Secondary and unspecified malignant neoplasm of intrapelvic lymph nodes: Secondary | ICD-10-CM

## 2013-08-28 DIAGNOSIS — C7982 Secondary malignant neoplasm of genital organs: Secondary | ICD-10-CM

## 2013-08-28 DIAGNOSIS — N189 Chronic kidney disease, unspecified: Secondary | ICD-10-CM

## 2013-08-28 DIAGNOSIS — C4492 Squamous cell carcinoma of skin, unspecified: Secondary | ICD-10-CM

## 2013-08-28 DIAGNOSIS — C539 Malignant neoplasm of cervix uteri, unspecified: Secondary | ICD-10-CM

## 2013-08-28 DIAGNOSIS — C7919 Secondary malignant neoplasm of other urinary organs: Secondary | ICD-10-CM

## 2013-08-28 DIAGNOSIS — Z8639 Personal history of other endocrine, nutritional and metabolic disease: Secondary | ICD-10-CM

## 2013-08-28 LAB — BASIC METABOLIC PANEL (CC13)
Anion Gap: 8 mEq/L (ref 3–11)
BUN: 10 mg/dL (ref 7.0–26.0)
CHLORIDE: 106 meq/L (ref 98–109)
CO2: 28 meq/L (ref 22–29)
Calcium: 9.2 mg/dL (ref 8.4–10.4)
Creatinine: 0.9 mg/dL (ref 0.6–1.1)
GLUCOSE: 115 mg/dL (ref 70–140)
Potassium: 4.5 mEq/L (ref 3.5–5.1)
SODIUM: 142 meq/L (ref 136–145)

## 2013-08-28 NOTE — Telephone Encounter (Signed)
, °

## 2013-08-28 NOTE — Progress Notes (Signed)
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 She continues IMRT by Dr Sondra Come planned thru 09-14-13; she is not a candidate for sensitizing CDDP chemotherapy. Pain has been better controlled on MS Contin 30 mg q 12 hours, tho still needs occasional prn oxycodone. Constipation is not adequately controlled with miralax once daily tho she is tolerating this without difficulty, did move last PM and this AM after magnesium citrate yesterday; see medication changes below. Appetite is a little better and she is drinking fluids;  I have again suggested adding clear liquid supplements, which are stocked at [facility]. Has used ODT zofran a couple of times, helpful. Is voiding, no gross hematuria. SOB is no worse. She is overdue for thyroid check by Dr Carlis Abbott.  She does not have PAC.  ONCOLOGIC HISTORY REASON FOR REFERRAL: squamous cell carcinoma metastatic to lower pelvis likely cervical primary, for consideration of sensitizing chemotherapy  HISTORY OF PRESENT ILLNESS:[name] is a 80 y.o. female who is seen in consultation, together with daughter, at the request of Dr Sondra Come, for consideration of sensitizing chemotherapy with radiation for recently diagnosed squamous cell carcinoma involving lower pelvis.  Patient believes that symptoms of dysuria and pelvic pain began ~ Jan 2015. She had CT AP 03-30-13 in [facility] system, which showed some abnormality at vaginal cuff / bladder base. She had pelvic US 04-13-13 which showed masslike appearance of probable cervical remnant. MRI pelvis 05-21-13 identified 2.7 x 4 x 3.8 cm mass at vaginal cuff, possibly invading posterior wall of bladder, with no adenopathy or  significant fluid collection. She was seen by Dr Louis Meckel of urology, with cystoscopy 06-30-13 showing 3 cm bladder mass near trigone, and exam under anesthesia also showed a nodular firm mass at anterior vaginal wall; biopsy of the bladder showed invasive poorly differentiated carcinoma suggestive of squamous carcinoma, and biopsy of vaginal mass also poorly differentiated carcinoma. PET 07-14-13 had 5 cm hypermetabolic mass involving vaginal cuff and posterior bladder base and hypermetabolic internal iliac nodes bilaterally, without evidence of more distant metastatic involvement. She was seen by Dr Denman George on 07-24-13, with finding of bladder with palpable mass centrally from mid vagina to vaginal apex, minimally mobile. Per Dr Denman George, patient is not surgical candidate for what would require anterior exenteration and urinary diversion due to tumor locally extensive and metastatic on PET to pelvic nodes such the procedure would not be curative, and fact that she is poor surgical candidate due to age, prior surgeries and comorbidities. Dr Denman George thought possible that the cancer arose from the deep margin of a small cervical remnant, given completely normal vaginal mucosa at her exam. Radiation therapy is underway by Dr Sondra Come; comorbidities and performance status do not allow sensitizing CDDP.  Review of systems as above, also: No fever or symptoms of infection. Is sleeping better. Pain still mostly low pelvis. No bleeding. "Always cold" despite thermostat at 80 at home; overdue thyroid check. Patient and daughter cannot tell that goiter is any different visually Remainder of 10 point Review of Systems negative.  Objective:  Vital signs in last 24 hours:  BP 151/65  Pulse 66  Temp(Src) 98 F (36.7 C) (Oral)  Resp 18  Ht 5' (1.524 m)  Wt 144 lb 4.8 oz (65.454  kg)  BMI 28.18 kg/m2 weight down 2 lbs.  Alert, oriented and cooperative and very pleasant, look more comfortable today tho obviously frail.  Ambulatory with walker. Respirations not labored RA.  Daughter very supportive.   HEENT:PERRL, sclerae not icteric. Oral mucosa moist without lesions, posterior pharynx clear.  Neck supple. No JVD. Goiter most prominent right neck, soft. Lymphatics:no cervical,suraclavicular adenopathy appreciated Resp: clear to auscultation bilaterally and normal percussion bilaterally, no rales Cardio: regular rate and rhythm. No gallop. GI: soft, nontender, not distended, no mass or organomegaly. Few bowel sounds.  Musculoskeletal/ Extremities: without pitting edema, cords, tenderness Neuro: speech fluent and appropriate, moves all extremities equally. PSYCH appropriate mood and affect, smiling at times. Skin without rash, ecchymosis, petechiae   Lab Results:  Results for orders placed in visit on 40/34/74  BASIC METABOLIC PANEL (QV95)      Result Value Ref Range   Sodium 142  136 - 145 mEq/L   Potassium 4.5  3.5 - 5.1 mEq/L   Chloride 106  98 - 109 mEq/L   CO2 28  22 - 29 mEq/L   Glucose 115  70 - 140 mg/dl   BUN 10.0  7.0 - 26.0 mg/dL   Creatinine 0.9  0.6 - 1.1 mg/dL   Calcium 9.2  8.4 - 10.4 mg/dL   Anion Gap 8  3 - 11 mEq/L    Will check TSH and CBC within the week, coordinating lab with RT appointments.  Studies/Results:  No results found.  Medications: I have reviewed the patient's current medications. Continue MS Contin 30 mg bid. Increase miralax to bid.   DISCUSSION: Medications and symptoms discussed as above. Patient and daughter aware that they can call this office at any time prior to scheduled return appointment if needed.  Assessment/Plan:  1.metastatic squamous cell carcinoma from cervical remnant involving vaginal cuff, bladder trigone and bilateral internal iliac nodes, in 80 yo lady with significant cardiac and pulmonary disease: not candidate for surgical intervention or for sensitizing cisplatin chemotherapy. We appreciate Dr Clabe Seal treatment. Continue pain  medication, increase laxatives, add nutrition supplements. I will see her back 09-11-13. Will check CBC with pelvic radiation. 2.cardiac disease with CHF, HTN known to Dr Acie Fredrickson  3. Chronic renal disease: creatinine better today likely with better hydration 4. COPD, past tobacco  5.post hysterectomy 1980s  6.hypothyroid with goiter: check TSH, will let Dr Carlis Abbott know 7.appendectomy  8.degenerative arthritis 9.advance directives in place   All questions answered and they know to call if needed prior to next scheduled appointment. Time spent 20 min including >50% counseling and coordination of care. Cc this note Drs Louis Meckel, Woodroe Mode, MD   08/28/2013, 11:13 AM

## 2013-08-29 ENCOUNTER — Other Ambulatory Visit: Payer: Self-pay | Admitting: Oncology

## 2013-08-29 DIAGNOSIS — C4492 Squamous cell carcinoma of skin, unspecified: Secondary | ICD-10-CM

## 2013-08-31 ENCOUNTER — Ambulatory Visit
Admission: RE | Admit: 2013-08-31 | Discharge: 2013-08-31 | Disposition: A | Discharge status: Home / Self Care | Payer: Medicare Other | Source: Ambulatory Visit | Attending: Radiation Oncology | Admitting: Radiation Oncology

## 2013-08-31 DIAGNOSIS — Z510 Encounter for antineoplastic radiation therapy: Secondary | ICD-10-CM | POA: Diagnosis not present

## 2013-09-01 ENCOUNTER — Ambulatory Visit
Admission: RE | Admit: 2013-09-01 | Discharge: 2013-09-01 | Disposition: A | Discharge status: Home / Self Care | Payer: Medicare Other | Source: Ambulatory Visit | Attending: Radiation Oncology | Admitting: Radiation Oncology

## 2013-09-01 ENCOUNTER — Encounter: Payer: Self-pay | Admitting: Radiation Oncology

## 2013-09-01 VITALS — BP 150/59 | HR 60 | Temp 97.3°F | Resp 20 | Wt 144.5 lb

## 2013-09-01 DIAGNOSIS — Z510 Encounter for antineoplastic radiation therapy: Secondary | ICD-10-CM | POA: Diagnosis not present

## 2013-09-01 DIAGNOSIS — C4492 Squamous cell carcinoma of skin, unspecified: Secondary | ICD-10-CM

## 2013-09-01 NOTE — Progress Notes (Signed)
  Radiation Oncology         (336) [phone] ________________________________  Name: [name] MRN: [id number]  Date: 09/01/2013  DOB: January 02, 1926  Weekly Radiation Therapy Management  DIAGNOSIS: Squamous cell carcinoma of unknown primary presenting in the bladder/anterior vaginal region   Current Dose: 37.4 Gy     Planned Dose:  55 Gy  Narrative . . . . . . . . The patient presents for routine under treatment assessment.                                   The patient is without complaint.  Her pain overall continues to improve. She does have some dysuria which is controlled well with Pyridium she denies any vaginal bleeding or hematuria                                  Set-up films were reviewed.                                 The chart was checked. Physical Findings. . .  weight is 144 lb 8 oz (65.545 kg). Her oral temperature is 97.3 F (36.3 C). Her blood pressure is 150/59 and her pulse is 60. Her respiration is 20. .the lungs are clear. The heart has a regular rhythm and rate. The abdomen is soft and nontender with normal bowel sounds.  Impression . . . . . . . The patient is tolerating radiation. Plan . . . . . . . . . . . . Continue treatment as planned.  ________________________________   Blair Promise, PhD, MD

## 2013-09-01 NOTE — Progress Notes (Addendum)
Weekly rad txs pelvis region, 17 txs completed, now taking miralax bid, had bm today, still mild dysuria  When voiding, takes pyridium tid, no discharge, appetite fair,  Not much appetite,loss 2 lbs 1 week, drinks plenty water, slight fatigued, no c/o pain , takes oxycodone last night, TSH ordered, hx goiter Last labs 08/03/26/15

## 2013-09-02 ENCOUNTER — Ambulatory Visit: Payer: Medicare Other

## 2013-09-02 ENCOUNTER — Telehealth: Payer: Self-pay | Admitting: Oncology

## 2013-09-02 NOTE — Telephone Encounter (Signed)
Called Ivin Booty to see how Ximena is doing.  Ivin Booty said she has been throwing up this morning and also has diarrhea.  She is not coming to radiation today.  Ivin Booty also said Dmya took Zofran 2 hours ago but is still throwing up.  She is going to get her Imodium AD for her diarrhea.  She said that Moon stopped taking her Morphine because she was not having pain and is wondering if this is why she is sick because it was causing constipation before.  Advised her that stopping the morphine was probably not the cause.  Advised her that it may be due to a stomach virus or radiation for the diarrhea.  Advised her to have Abbegail take sips of water and to follow a bland diet today, take Zofran again tonight and to not have her take miralax today.  Advised her that I will notify Dr. Sondra Come and I will call to check on her this afternoon.  Ivin Booty verbalized agreement.

## 2013-09-02 NOTE — Telephone Encounter (Signed)
Called [name] to see how she is feeling.  Her daughter Ivin Booty said that she is nauseated but not throwing up.  She has taken a few sips of water.  Advised her to encourage her to keep taking sips of water and to take her Zofran tonight.  Advised her to come to treatment tomorrow and be seen by Dr. Sondra Come if she is not feeling any better.

## 2013-09-03 ENCOUNTER — Ambulatory Visit
Admission: RE | Admit: 2013-09-03 | Discharge: 2013-09-03 | Disposition: A | Discharge status: Home / Self Care | Payer: Medicare Other | Source: Ambulatory Visit | Attending: Radiation Oncology | Admitting: Radiation Oncology

## 2013-09-03 ENCOUNTER — Telehealth: Payer: Self-pay | Admitting: Oncology

## 2013-09-03 DIAGNOSIS — Z510 Encounter for antineoplastic radiation therapy: Secondary | ICD-10-CM | POA: Diagnosis not present

## 2013-09-03 NOTE — Telephone Encounter (Signed)
Called [name] to see how she is feeling today.  She said she is feeling better but weak.  She has not vomited today and the diarrhea is better.  Advised her to push fluids and to call the on call doctor if needed.  Tamryn verbalized agreement.

## 2013-09-04 ENCOUNTER — Ambulatory Visit
Admission: RE | Admit: 2013-09-04 | Discharge: 2013-09-04 | Disposition: A | Discharge status: Home / Self Care | Payer: Medicare Other | Source: Ambulatory Visit | Attending: Radiation Oncology | Admitting: Radiation Oncology

## 2013-09-04 ENCOUNTER — Encounter: Payer: Self-pay | Admitting: Radiation Oncology

## 2013-09-04 DIAGNOSIS — C4492 Squamous cell carcinoma of skin, unspecified: Secondary | ICD-10-CM

## 2013-09-04 DIAGNOSIS — Z510 Encounter for antineoplastic radiation therapy: Secondary | ICD-10-CM | POA: Diagnosis not present

## 2013-09-04 LAB — CBC WITH DIFFERENTIAL/PLATELET
BASO%: 0.7 % (ref 0.0–2.0)
BASOS ABS: 0.1 10*3/uL (ref 0.0–0.1)
EOS ABS: 0 10*3/uL (ref 0.0–0.5)
EOS%: 0.4 % (ref 0.0–7.0)
HEMATOCRIT: 36.4 % (ref 34.8–46.6)
HEMOGLOBIN: 11.6 g/dL (ref 11.6–15.9)
LYMPH#: 0.5 10*3/uL — AB (ref 0.9–3.3)
LYMPH%: 7.4 % — ABNORMAL LOW (ref 14.0–49.7)
MCH: 28.7 pg (ref 25.1–34.0)
MCHC: 31.8 g/dL (ref 31.5–36.0)
MCV: 90 fL (ref 79.5–101.0)
MONO#: 0.8 10*3/uL (ref 0.1–0.9)
MONO%: 12.1 % (ref 0.0–14.0)
NEUT%: 79.4 % — AB (ref 38.4–76.8)
NEUTROS ABS: 5.6 10*3/uL (ref 1.5–6.5)
Platelets: 317 10*3/uL (ref 145–400)
RBC: 4.04 10*6/uL (ref 3.70–5.45)
RDW: 13.9 % (ref 11.2–14.5)
WBC: 7 10*3/uL (ref 3.9–10.3)

## 2013-09-04 LAB — BASIC METABOLIC PANEL (CC13)
Anion Gap: 7 mEq/L (ref 3–11)
BUN: 16.4 mg/dL (ref 7.0–26.0)
CHLORIDE: 107 meq/L (ref 98–109)
CO2: 26 mEq/L (ref 22–29)
CREATININE: 0.9 mg/dL (ref 0.6–1.1)
Calcium: 9.1 mg/dL (ref 8.4–10.4)
GLUCOSE: 103 mg/dL (ref 70–140)
POTASSIUM: 3.4 meq/L — AB (ref 3.5–5.1)
Sodium: 140 mEq/L (ref 136–145)

## 2013-09-04 NOTE — Progress Notes (Signed)
Pt sent for lab work.  Only abnormal revealed was potassium 3.4 low.  Instructed to increase fluids and intake of potassium (bananas, potatoes etc).  Instructed to contact PCP if further issues continue.

## 2013-09-04 NOTE — Progress Notes (Signed)
Weekly Management Note:  outpatient    ICD-9-CM ICD-10-CM   1. Squamous cell carcinoma of unknown origin 199.1 H60.7 Basic metabolic panel     CBC with Differential    Status post 19 fractions  Narrative:  The patient presents for routine under treatment assessment.  CBCT/MVCT images/Port film x-rays were reviewed.  The chart was checked. Therapist sent her back to nursing due to "feeling weak." That is her main complaint. She feels pretty well overall, however. Earlier this week she had diarrhea nausea and vomiting but these symptoms have resolved. She is now able to eating/drink. Her urinary output, she reports, is normal. Orthostatic vital signs are negative. Not lightheaded   Physical Findings: Nontoxic appearing. Drinking ginger ale. . Vitals with Age-Percentiles 09/04/2013 09/04/2013  Length    Systolic 371 062  Diastolic 69 69  Pulse 76 65  Respiration  24  Weight  63.141 kg  BMI    VISIT REPORT     CBC    Component Value Date/Time   WBC 7.0 09/04/2013 1059   WBC 8.1 06/24/2013 1030   WBC 12.5* 01/14/2012 1246   RBC 4.04 09/04/2013 1059   RBC 4.05 06/24/2013 1030   RBC 4.03* 01/14/2012 1246   HGB 11.6 09/04/2013 1059   HGB 12.1 06/24/2013 1030   HGB 11.5* 01/14/2012 1246   HCT 36.4 09/04/2013 1059   HCT 36.1 06/24/2013 1030   HCT 37.8 01/14/2012 1246   PLT 317 09/04/2013 1059   PLT 296 06/24/2013 1030   MCV 90.0 09/04/2013 1059   MCV 89.1 06/24/2013 1030   MCV 93.9 01/14/2012 1246   MCH 28.7 09/04/2013 1059   MCH 29.9 06/24/2013 1030   MCH 28.5 01/14/2012 1246   MCHC 31.8 09/04/2013 1059   MCHC 33.5 06/24/2013 1030   MCHC 30.4* 01/14/2012 1246   RDW 13.9 09/04/2013 1059   RDW 12.4 06/24/2013 1030   LYMPHSABS 0.5* 09/04/2013 1059   LYMPHSABS 0.6* 03/24/2012 0635   MONOABS 0.8 09/04/2013 1059   MONOABS 1.3* 03/24/2012 0635   EOSABS 0.0 09/04/2013 1059   EOSABS 0.3 03/24/2012 0635   BASOSABS 0.1 09/04/2013 1059   BASOSABS 0.1 03/24/2012 0635     CMP     Component Value Date/Time   NA 140  09/04/2013 1059   NA 146 06/24/2013 1030   K 3.4* 09/04/2013 1059   K 4.5 06/24/2013 1030   CL 107 06/24/2013 1030   CO2 26 09/04/2013 1059   CO2 28 06/24/2013 1030   GLUCOSE 103 09/04/2013 1059   GLUCOSE 106* 06/24/2013 1030   BUN 16.4 09/04/2013 1059   BUN 22 06/24/2013 1030   CREATININE 0.9 09/04/2013 1059   CREATININE 0.81 06/24/2013 1030   CREATININE 0.81 10/16/2012 1231   CALCIUM 9.1 09/04/2013 1059   CALCIUM 9.3 06/24/2013 1030   PROT 6.9 08/14/2013 1507   PROT 6.7 04/01/2013 1143   ALBUMIN 3.1* 08/14/2013 1507   ALBUMIN 3.7 04/01/2013 1143   AST 12 08/14/2013 1507   AST 17 04/01/2013 1143   ALT 7 08/14/2013 1507   ALT 13 04/01/2013 1143   ALKPHOS 68 08/14/2013 1507   ALKPHOS 60 04/01/2013 1143   BILITOT 0.98 08/14/2013 1507   BILITOT 1.0 04/01/2013 1143   GFRNONAA 64* 06/24/2013 1030   GFRAA 75* 06/24/2013 1030     Impression:  The patient is tolerating radiotherapy.   Plan:  Continue radiotherapy as planned. She does not appear to be dehydrated. Labs are reassuring. Advised to eat foods rich  in potassium and to maintain oral hydration and nutrition.  -----------------------------------  Eppie Gibson, MD

## 2013-09-04 NOTE — Progress Notes (Signed)
Pt here due to not feeling well.  Pt reports the have had continued days of diarrhea, nausea and vomiting.  She took Imodium which resolved the diarrhea.  She hasn't vomited in a few days as well.  Pt has lost approx 5 lbs since Tuesday of this week.  Noted skin tenting on hands.  Reports her urinary output is "normal."  Her appetite is low and she continues to feel nauseated.  Orthostatic VS done.

## 2013-09-07 ENCOUNTER — Other Ambulatory Visit: Payer: Self-pay | Admitting: Oncology

## 2013-09-08 ENCOUNTER — Other Ambulatory Visit: Payer: Self-pay | Admitting: Oncology

## 2013-09-08 ENCOUNTER — Ambulatory Visit
Admission: RE | Admit: 2013-09-08 | Discharge: 2013-09-08 | Disposition: A | Discharge status: Home / Self Care | Payer: Medicare Other | Source: Ambulatory Visit | Attending: Radiation Oncology | Admitting: Radiation Oncology

## 2013-09-08 DIAGNOSIS — Z510 Encounter for antineoplastic radiation therapy: Secondary | ICD-10-CM | POA: Diagnosis not present

## 2013-09-09 ENCOUNTER — Encounter: Payer: Self-pay | Admitting: Radiation Oncology

## 2013-09-09 ENCOUNTER — Ambulatory Visit
Admission: RE | Admit: 2013-09-09 | Discharge: 2013-09-09 | Disposition: A | Discharge status: Home / Self Care | Payer: Medicare Other | Source: Ambulatory Visit | Attending: Radiation Oncology | Admitting: Radiation Oncology

## 2013-09-09 VITALS — BP 151/66 | HR 52 | Temp 97.8°F | Ht 60.0 in | Wt 142.0 lb

## 2013-09-09 DIAGNOSIS — C4492 Squamous cell carcinoma of skin, unspecified: Secondary | ICD-10-CM

## 2013-09-09 DIAGNOSIS — Z510 Encounter for antineoplastic radiation therapy: Secondary | ICD-10-CM | POA: Diagnosis not present

## 2013-09-09 NOTE — Progress Notes (Signed)
  Radiation Oncology         (336) [phone] ________________________________  Name: [name] MRN: [id number]  Date: 09/09/2013  DOB: 1925-11-05  Weekly Radiation Therapy Management  Current Dose: 46.2 Gy     Planned Dose:  55 Gy  Narrative . . . . . . . . The patient presents for routine under treatment assessment.                                   The patient is without complaint. Her nausea is improved. She denies any diarrhea at this time. She denies any vaginal bleeding or urinary symptoms.  Her pain overall is much improved.                                 Set-up films were reviewed.                                 The chart was checked. Physical Findings. . .  height is 5' (1.524 m) and weight is 142 lb (64.411 kg). Her oral temperature is 97.8 F (36.6 C). Her blood pressure is 151/66 and her pulse is 52. . Weight essentially stable.  No significant changes. Impression . . . . . . . The patient is tolerating radiation. Plan . . . . . . . . . . . . Continue treatment as planned.  ________________________________   Blair Promise, PhD, MD

## 2013-09-09 NOTE — Progress Notes (Signed)
[name] has completed 21 fractions to her lower pelvis.  She denies pain.  She reports having two bowel movements per day and takes Imodium as needed.  She reports her nausea has improved and she has not vomited since last week.  Her weight is up 3 lbs today.  She reports urinating more frequently.  She denies dysuria.  She denies skin irritation.  She reports fatigue.

## 2013-09-10 ENCOUNTER — Ambulatory Visit
Admission: RE | Admit: 2013-09-10 | Discharge: 2013-09-10 | Disposition: A | Discharge status: Home / Self Care | Payer: Medicare Other | Source: Ambulatory Visit | Attending: Radiation Oncology | Admitting: Radiation Oncology

## 2013-09-10 DIAGNOSIS — Z510 Encounter for antineoplastic radiation therapy: Secondary | ICD-10-CM | POA: Diagnosis not present

## 2013-09-11 ENCOUNTER — Ambulatory Visit (HOSPITAL_BASED_OUTPATIENT_CLINIC_OR_DEPARTMENT_OTHER): Payer: Medicare Other | Admitting: Oncology

## 2013-09-11 ENCOUNTER — Other Ambulatory Visit (HOSPITAL_BASED_OUTPATIENT_CLINIC_OR_DEPARTMENT_OTHER): Payer: Medicare Other

## 2013-09-11 ENCOUNTER — Ambulatory Visit
Admission: RE | Admit: 2013-09-11 | Discharge: 2013-09-11 | Disposition: A | Discharge status: Home / Self Care | Payer: Medicare Other | Source: Ambulatory Visit | Attending: Radiation Oncology | Admitting: Radiation Oncology

## 2013-09-11 ENCOUNTER — Encounter: Payer: Self-pay | Admitting: Oncology

## 2013-09-11 VITALS — BP 162/69 | HR 62 | Temp 97.5°F | Resp 18 | Ht 60.0 in | Wt 143.3 lb

## 2013-09-11 DIAGNOSIS — Z23 Encounter for immunization: Secondary | ICD-10-CM

## 2013-09-11 DIAGNOSIS — N189 Chronic kidney disease, unspecified: Secondary | ICD-10-CM

## 2013-09-11 DIAGNOSIS — E039 Hypothyroidism, unspecified: Secondary | ICD-10-CM

## 2013-09-11 DIAGNOSIS — C539 Malignant neoplasm of cervix uteri, unspecified: Secondary | ICD-10-CM

## 2013-09-11 DIAGNOSIS — C4492 Squamous cell carcinoma of skin, unspecified: Secondary | ICD-10-CM

## 2013-09-11 DIAGNOSIS — C779 Secondary and unspecified malignant neoplasm of lymph node, unspecified: Secondary | ICD-10-CM

## 2013-09-11 DIAGNOSIS — C50919 Malignant neoplasm of unspecified site of unspecified female breast: Secondary | ICD-10-CM

## 2013-09-11 DIAGNOSIS — Z8639 Personal history of other endocrine, nutritional and metabolic disease: Secondary | ICD-10-CM

## 2013-09-11 DIAGNOSIS — Z510 Encounter for antineoplastic radiation therapy: Secondary | ICD-10-CM | POA: Diagnosis not present

## 2013-09-11 LAB — CBC WITH DIFFERENTIAL/PLATELET
BASO%: 0.9 % (ref 0.0–2.0)
Basophils Absolute: 0 10*3/uL (ref 0.0–0.1)
EOS%: 5.9 % (ref 0.0–7.0)
Eosinophils Absolute: 0.3 10*3/uL (ref 0.0–0.5)
HCT: 33 % — ABNORMAL LOW (ref 34.8–46.6)
HGB: 10.9 g/dL — ABNORMAL LOW (ref 11.6–15.9)
LYMPH%: 11.4 % — ABNORMAL LOW (ref 14.0–49.7)
MCH: 30.2 pg (ref 25.1–34.0)
MCHC: 33 g/dL (ref 31.5–36.0)
MCV: 91.4 fL (ref 79.5–101.0)
MONO#: 0.6 10*3/uL (ref 0.1–0.9)
MONO%: 13 % (ref 0.0–14.0)
NEUT#: 2.9 10*3/uL (ref 1.5–6.5)
NEUT%: 68.8 % (ref 38.4–76.8)
Platelets: 259 10*3/uL (ref 145–400)
RBC: 3.61 10*6/uL — AB (ref 3.70–5.45)
RDW: 14.4 % (ref 11.2–14.5)
WBC: 4.2 10*3/uL (ref 3.9–10.3)
lymph#: 0.5 10*3/uL — ABNORMAL LOW (ref 0.9–3.3)

## 2013-09-11 LAB — BASIC METABOLIC PANEL (CC13)
Anion Gap: 7 mEq/L (ref 3–11)
BUN: 10.5 mg/dL (ref 7.0–26.0)
CALCIUM: 8.8 mg/dL (ref 8.4–10.4)
CHLORIDE: 108 meq/L (ref 98–109)
CO2: 26 mEq/L (ref 22–29)
CREATININE: 0.8 mg/dL (ref 0.6–1.1)
Glucose: 100 mg/dl (ref 70–140)
Potassium: 4.3 mEq/L (ref 3.5–5.1)
Sodium: 141 mEq/L (ref 136–145)

## 2013-09-11 LAB — TSH CHCC: TSH: 0.565 m[IU]/L (ref 0.308–3.960)

## 2013-09-11 MED ORDER — INFLUENZA VAC SPLIT QUAD 0.5 ML IM SUSY
0.5000 mL | PREFILLED_SYRINGE | Freq: Once | INTRAMUSCULAR | Status: AC
  Administered 2013-09-11: 0.5 mL via INTRAMUSCULAR
  Filled 2013-09-11: qty 0.5

## 2013-09-11 NOTE — Progress Notes (Signed)
OFFICE PROGRESS NOTE   09/11/2013   Physicians:[name], [name], [name], [name]   INTERVAL HISTORY:  Patient is seen, together with daughter, for symptom management related to recurrent cervical cancer. Radiation is to complete on 09-15-12. She has been much more comfortable for over a week, to the point that she stopped MSContin and has not needed OxyIR. Constipation has resolved off of the pain medication, and appetite has been much better this week.  She does not have PAC. She will receive flu vaccine today.   ONCOLOGIC HISTORY Patient believes that symptoms of dysuria and pelvic pain began ~ Jan 2015. She had CT AP 03-30-13 in [facility] system, which showed some abnormality at vaginal cuff / bladder base. She had pelvic US 04-13-13 which showed masslike appearance of probable cervical remnant. MRI pelvis 05-21-13 identified 2.7 x 4 x 3.8 cm mass at vaginal cuff, possibly invading posterior wall of bladder, with no adenopathy or significant fluid collection. She was seen by Dr [name] of urology, with cystoscopy 06-30-13 showing 3 cm bladder mass near trigone, and exam under anesthesia also showed a nodular firm mass at anterior vaginal wall; biopsy of the bladder showed invasive poorly differentiated carcinoma suggestive of squamous carcinoma, and biopsy of vaginal mass also poorly differentiated carcinoma. PET 07-14-13 had 5 cm hypermetabolic mass involving vaginal cuff and posterior bladder base and hypermetabolic internal iliac nodes bilaterally, without evidence of more distant metastatic involvement. She was seen by Dr [name] on 07-24-13, with finding of bladder with palpable mass centrally from mid vagina to vaginal apex, minimally mobile. Per Dr [name], patient is not surgical candidate for what would require anterior exenteration and urinary diversion due to tumor locally extensive and metastatic on PET to pelvic  nodes such the procedure would not be curative, and fact that she is poor surgical candidate due to age, prior surgeries and comorbidities. Dr [name] thought possible that the cancer arose from the deep margin of a small cervical remnant, given completely normal vaginal mucosa at her exam. Radiation therapy is underway by Dr [name]; comorbidities and performance status do not allow sensitizing CDDP.  Review of systems as above, also: Difficulty sleeping since stopping pain medication, tho she had slept well when on that. Some chronic arthritis right shoulder, not really bothersome at night. 48 hrs of vomiting and diarrhea since she was here last, thought gastroenteritis by Dr [name]; no other nausea/ vomiting, or diarrhea. Remainder of 10 point Review of Systems negative.  Objective:  Vital signs in last 24 hours:  BP 162/69  Pulse 62  Temp(Src) 97.5 F (36.4 C) (Oral)  Resp 18  Ht 5' (1.524 m)  Wt 143 lb 4.8 oz (65 kg)  BMI 27.99 kg/m2 weight is up 1 lb  Alert, oriented and appropriate. Ambulatory with walker. Smiling and looks much more comfortable today   HEENT:PERRL, sclerae not icteric. Oral mucosa moist without lesions, posterior pharynx clear.  Neck supple. No JVD. Goiter prominent, L>R. Lymphatics:no cervical,suraclavicular adenopathy Resp: clear to auscultation bilaterally and normal percussion bilaterally Cardio: regular rate and rhythm. No gallop. GI: soft, nontender, not distended, no mass or organomegaly. Normally active bowel sounds.  Musculoskeletal/ Extremities: without pitting edema, cords, tenderness. PSYCH appropriate mood and affect Neuro: no peripheral neuropathy. Otherwise nonfocal Skin without rash, ecchymosis   Lab Results:  Results for orders placed in visit on 09/11/13  CBC WITH DIFFERENTIAL      Result Value Ref Range  WBC 4.2  3.9 - 10.3 10e3/uL   NEUT# 2.9  1.5 - 6.5 10e3/uL   HGB 10.9 (*) 11.6 - 15.9 g/dL   HCT 33.0 (*) 34.8 - 46.6 %   Platelets  259  145 - 400 10e3/uL   MCV 91.4  79.5 - 101.0 fL   MCH 30.2  25.1 - 34.0 pg   MCHC 33.0  31.5 - 36.0 g/dL   RBC 3.61 (*) 3.70 - 5.45 10e6/uL   RDW 14.4  11.2 - 14.5 %   lymph# 0.5 (*) 0.9 - 3.3 10e3/uL   MONO# 0.6  0.1 - 0.9 10e3/uL   Eosinophils Absolute 0.3  0.0 - 0.5 10e3/uL   Basophils Absolute 0.0  0.0 - 0.1 10e3/uL   NEUT% 68.8  38.4 - 76.8 %   LYMPH% 11.4 (*) 14.0 - 49.7 %   MONO% 13.0  0.0 - 14.0 %   EOS% 5.9  0.0 - 7.0 %   BASO% 0.9  0.0 - 2.0 %  BASIC METABOLIC PANEL (EY81)      Result Value Ref Range   Sodium 141  136 - 145 mEq/L   Potassium 4.3  3.5 - 5.1 mEq/L   Chloride 108  98 - 109 mEq/L   CO2 26  22 - 29 mEq/L   Glucose 100  70 - 140 mg/dl   BUN 10.5  7.0 - 26.0 mg/dL   Creatinine 0.8  0.6 - 1.1 mg/dL   Calcium 8.8  8.4 - 10.4 mg/dL   Anion Gap 7  3 - 11 mEq/L    TSH available after visit 0.56  Studies/Results:  No results found.  Medications: I have reviewed the patient's current medications. Suggested benadryl or generic equivalent at hs if needed for sleep. She has some pain medication left at home, which they will keep in case needed at later time.  DISCUSSION:I am glad to see her back if needed, but will not set up scheduled visit at present. Follow up reviewed with Dr [name], who would be glad to see her back ~ 3 mo after Dr [name] if he would like.  Assessment/Plan: 1.metastatic squamous cell carcinoma from cervical remnant involving vaginal cuff, bladder trigone and bilateral internal iliac nodes, in 80 yo lady with significant cardiac and pulmonary disease: not candidate for surgical intervention or for sensitizing cisplatin chemotherapy. Pain much improved for past week with radiation, which will complete 09-15-13. She will see Dr [name] then in late Oct. I am glad to see her back in future if I can be of help. 2.cardiac disease with CHF, HTN known to Dr [name]  3. Chronic renal disease: creatinine better with better hydration  4. COPD, past tobacco   5.post hysterectomy 1980s  6.hypothyroid with goiter: will let Dr [name] know TSH results as above, as she is overdue follow up with him due to recent cancer problems 7.appendectomy  8.degenerative arthritis  9.advance directives in place 10. Flu vaccine given today  Patient and daughter are in agreement with plan above.     Tayvian Holycross P, MD   09/11/2013, 11:38 AM

## 2013-09-14 ENCOUNTER — Ambulatory Visit: Payer: Medicare Other

## 2013-09-14 ENCOUNTER — Telehealth: Payer: Self-pay

## 2013-09-14 ENCOUNTER — Ambulatory Visit
Admission: RE | Admit: 2013-09-14 | Discharge: 2013-09-14 | Disposition: A | Discharge status: Home / Self Care | Payer: Medicare Other | Source: Ambulatory Visit | Attending: Radiation Oncology | Admitting: Radiation Oncology

## 2013-09-14 DIAGNOSIS — Z510 Encounter for antineoplastic radiation therapy: Secondary | ICD-10-CM | POA: Diagnosis not present

## 2013-09-14 NOTE — Telephone Encounter (Signed)
Told [name] the results of the thyroid test  As noted below by Dr. Marko Plume.  Pt. Appreciated the call.

## 2013-09-14 NOTE — Telephone Encounter (Signed)
Message copied by Baruch Merl on Mon Sep 14, 2013  3:14 PM ------      Message from: Gordy Levan      Created: Sun Sep 13, 2013  6:42 PM       Please let her know thyroid test was in normal range and that I sent the information to Dr Jeanann Lewandowsky. ------

## 2013-09-15 ENCOUNTER — Ambulatory Visit: Payer: Medicare Other

## 2013-09-15 ENCOUNTER — Encounter: Payer: Self-pay | Admitting: Radiation Oncology

## 2013-09-15 ENCOUNTER — Ambulatory Visit: Payer: Medicare Other | Admitting: Radiation Oncology

## 2013-09-15 ENCOUNTER — Ambulatory Visit
Admission: RE | Admit: 2013-09-15 | Discharge: 2013-09-15 | Disposition: A | Discharge status: Home / Self Care | Payer: Medicare Other | Source: Ambulatory Visit | Attending: Radiation Oncology | Admitting: Radiation Oncology

## 2013-09-15 VITALS — BP 140/62 | HR 61 | Temp 98.3°F | Resp 20 | Ht 60.0 in | Wt 141.8 lb

## 2013-09-15 DIAGNOSIS — Z510 Encounter for antineoplastic radiation therapy: Secondary | ICD-10-CM | POA: Diagnosis not present

## 2013-09-15 DIAGNOSIS — C4492 Squamous cell carcinoma of skin, unspecified: Secondary | ICD-10-CM

## 2013-09-15 NOTE — Progress Notes (Signed)
[name] has completed treatment with 25 fractions to her lower pelvis.  She is using a walker today.  She denies pain, nausea, dysuria, and vaginal/rectal bleeding .  She reports some skin irritation and is using baby wipes.  She reports urinary frequency but says it is due to her lasix.  She reports soft bowel movements and takes imodium as needed.  She reports fatigue.

## 2013-09-15 NOTE — Progress Notes (Signed)
  Radiation Oncology         (336) [phone] ________________________________  Name: [name] MRN: [id number]  Date: 09/15/2013  DOB: Jul 26, 1925  End of Treatment Note  Diagnosis:    Squamous cell carcinoma of unknown origin presenting in the bladder and upper vaginal area    Indication for treatment:  Local regional control and pain      Radiation treatment dates:   August 10 through September 15  Site/dose:   55 gray in 25 fractions (2.2 gray per fraction) treatment was directed at the vaginal/bladder mass  Beams/energy:   Helical intensity modulated radiation therapy, 6 MV photons  Narrative: The patient tolerated radiation treatment relatively well.   She did experience some fatigue during the course for treatment. Her pain overall improved significantly and during the last week of her treatment she was completely off prescription pain medications.  Plan: The patient has completed radiation treatment. The patient will return to radiation oncology clinic for routine followup in one month. I advised them to call or return sooner if they have any questions or concerns related to their recovery or treatment.  -----------------------------------  Blair Promise, PhD, MD

## 2013-09-15 NOTE — Progress Notes (Signed)
  Radiation Oncology         [phone] ________________________________  Name: TEKESHIA KLAHR MRN: [id number]  Date: 09/15/2013  DOB: Mar 16, 1925  Weekly Radiation Therapy Management    Current Dose: 55 Gy     Planned Dose:  55 Gy  Narrative . . . . . . . . The patient presents for routine under treatment assessment. She is happy to complete her radiation therapy today.                                   The patient is without complaint for difficulties with sleeping at night. The patient's pain overall is much improved and she has stopped taking her prescription pain medication. She denies any vaginal bleeding hematuria or rectal bleeding. She does have some mild skin irritation is using baby wipes for this issue.                                 Set-up films were reviewed.                                 The chart was checked. Physical Findings. . .  height is 5' (1.524 m) and weight is 141 lb 12.8 oz (64.32 kg). Her oral temperature is 98.3 F (36.8 C). Her blood pressure is 140/62 and her pulse is 61. Her respiration is 20. . Weight essentially stable.  No significant changes. Impression . . . . . . . The patient is tolerating radiation. Plan . . . . . . . . . . . . Continue treatment as planned.  ________________________________   Blair Promise, PhD, MD

## 2013-09-28 ENCOUNTER — Other Ambulatory Visit: Payer: Self-pay | Admitting: Oncology

## 2013-09-28 ENCOUNTER — Ambulatory Visit
Admission: RE | Admit: 2013-09-28 | Discharge: 2013-09-28 | Disposition: A | Discharge status: Home / Self Care | Payer: Medicare Other | Source: Ambulatory Visit | Attending: Radiation Oncology | Admitting: Radiation Oncology

## 2013-09-28 ENCOUNTER — Telehealth: Payer: Self-pay | Admitting: Oncology

## 2013-09-28 DIAGNOSIS — C4492 Squamous cell carcinoma of skin, unspecified: Secondary | ICD-10-CM

## 2013-09-28 DIAGNOSIS — Z510 Encounter for antineoplastic radiation therapy: Secondary | ICD-10-CM | POA: Insufficient documentation

## 2013-09-28 DIAGNOSIS — C801 Malignant (primary) neoplasm, unspecified: Secondary | ICD-10-CM | POA: Insufficient documentation

## 2013-09-28 LAB — URINALYSIS, MICROSCOPIC - CHCC
BILIRUBIN (URINE): NEGATIVE
GLUCOSE UR CHCC: NEGATIVE mg/dL
Ketones: NEGATIVE mg/dL
NITRITE: NEGATIVE
Protein: 300 mg/dL
Specific Gravity, Urine: 1.025 (ref 1.003–1.035)
UROBILINOGEN UR: 0.2 mg/dL (ref 0.2–1)
pH: 6.5 (ref 4.6–8.0)

## 2013-09-28 NOTE — Telephone Encounter (Signed)
Called [name] and advised her that Dr. Sondra Come would like to have [name] come in for a urinalysis.  [name] said they would be able to come in 20 minutes.  Also advised [name] to have [name] push fluids today to see if her urination improves.  [name] verbalized agreement.  Advised her that we would call with results as soon as they are available.

## 2013-09-28 NOTE — Telephone Encounter (Signed)
Called Ivin Booty regarding her voice mail.  Ivin Booty stated that Manami started having pain in the lower groin area last night.  I then talked to [place] who said the pain started at 6 last night.  She could not describe the quality of the pain except that it was vague and was keeping her from resting.  She did take an oxycodone which helped the pain to let up.  She also reported urinating small amounts since yesterday but said she is not drinking a lot of fluids.  She also reported having diarrhea yesterday.  Advised her that I would check with Dr. Sondra Come and call her back.

## 2013-09-29 LAB — URINE CULTURE

## 2013-10-01 ENCOUNTER — Telehealth: Payer: Self-pay | Admitting: Oncology

## 2013-10-01 NOTE — Telephone Encounter (Signed)
[name] returned my call.  Advised her of the good results on her Urine Culture.  She verbalized agreement.  She said that she has not had any more pain and has not had to take any more pain medication since she has been drinking more fluids.  Advised her to keep drinking fluids and to call with any questions or concerns.

## 2013-10-01 NOTE — Telephone Encounter (Signed)
Called [name] and left a message requesting a call back.  Also called her daughter, Ivin Booty, and notified her of the good results on her urine culture.  She reports that the pain [name] was having has "let up" a little but she is still having to take pain medication.  Will clarify this with [name] when she calls back.

## 2013-10-28 ENCOUNTER — Encounter: Payer: Self-pay | Admitting: Oncology

## 2013-10-29 ENCOUNTER — Telehealth: Payer: Self-pay | Admitting: Oncology

## 2013-10-29 ENCOUNTER — Telehealth: Payer: Self-pay | Admitting: *Deleted

## 2013-10-29 ENCOUNTER — Ambulatory Visit
Admission: RE | Admit: 2013-10-29 | Discharge: 2013-10-29 | Disposition: A | Discharge status: Home / Self Care | Payer: Medicare Other | Source: Ambulatory Visit | Attending: Radiation Oncology | Admitting: Radiation Oncology

## 2013-10-29 ENCOUNTER — Encounter: Payer: Self-pay | Admitting: Radiation Oncology

## 2013-10-29 VITALS — BP 171/68 | HR 65 | Temp 97.5°F | Resp 24 | Ht 60.0 in | Wt 145.0 lb

## 2013-10-29 DIAGNOSIS — C801 Malignant (primary) neoplasm, unspecified: Secondary | ICD-10-CM | POA: Insufficient documentation

## 2013-10-29 DIAGNOSIS — C4492 Squamous cell carcinoma of skin, unspecified: Secondary | ICD-10-CM

## 2013-10-29 LAB — CBC WITH DIFFERENTIAL/PLATELET
BASO%: 1.5 % (ref 0.0–2.0)
Basophils Absolute: 0.1 10*3/uL (ref 0.0–0.1)
EOS%: 7.4 % — ABNORMAL HIGH (ref 0.0–7.0)
Eosinophils Absolute: 0.3 10*3/uL (ref 0.0–0.5)
HCT: 32.4 % — ABNORMAL LOW (ref 34.8–46.6)
HGB: 11.8 g/dL (ref 11.6–15.9)
LYMPH#: 0.5 10*3/uL — AB (ref 0.9–3.3)
LYMPH%: 14.7 % (ref 14.0–49.7)
MCH: 37 pg — AB (ref 25.1–34.0)
MCHC: 36.4 g/dL — AB (ref 31.5–36.0)
MCV: 101.6 fL — ABNORMAL HIGH (ref 79.5–101.0)
MONO#: 0.3 10*3/uL (ref 0.1–0.9)
MONO%: 9.1 % (ref 0.0–14.0)
NEUT#: 2.5 10*3/uL (ref 1.5–6.5)
NEUT%: 67.3 % (ref 38.4–76.8)
Platelets: 214 10*3/uL (ref 145–400)
RBC: 3.18 10*6/uL — ABNORMAL LOW (ref 3.70–5.45)
RDW: 14.1 % (ref 11.2–14.5)
WBC: 3.7 10*3/uL — ABNORMAL LOW (ref 3.9–10.3)

## 2013-10-29 LAB — URINALYSIS, MICROSCOPIC - CHCC
BILIRUBIN (URINE): NEGATIVE
Blood: NEGATIVE
Glucose: NEGATIVE mg/dL
Ketones: NEGATIVE mg/dL
Nitrite: NEGATIVE
Protein: 30 mg/dL
Specific Gravity, Urine: 1.01 (ref 1.003–1.035)
Urobilinogen, UR: 0.2 mg/dL (ref 0.2–1)
pH: 7.5 (ref 4.6–8.0)

## 2013-10-29 LAB — BASIC METABOLIC PANEL (CC13)
Anion Gap: 6 mEq/L (ref 3–11)
BUN: 16.8 mg/dL (ref 7.0–26.0)
CHLORIDE: 111 meq/L — AB (ref 98–109)
CO2: 28 mEq/L (ref 22–29)
CREATININE: 0.8 mg/dL (ref 0.6–1.1)
Calcium: 9.3 mg/dL (ref 8.4–10.4)
Glucose: 101 mg/dl (ref 70–140)
Potassium: 4.1 mEq/L (ref 3.5–5.1)
Sodium: 145 mEq/L (ref 136–145)

## 2013-10-29 NOTE — Progress Notes (Signed)
Radiation Oncology         [phone] ________________________________  Name: [name] MRN: [id number]  Date: 10/29/2013  DOB: 1925/06/25  Follow-Up Visit Note  CC: Leonard Downing, MD  Everitt Amber, MD    ICD-9-CM ICD-10-CM   1. Squamous cell carcinoma of unknown origin 199.1 C80.1 CBC with Differential     Basic metabolic panel     Urinalysis, Microscopic - [facility]     Urine culture     CT Pelvis W Contrast    Diagnosis:  Squamous cell carcinoma of unknown origin presenting in the bladder and upper vaginal area   Interval Since Last Radiation:  6  weeks  Narrative:  The patient returns today for routine follow-up.  She seems to be doing well this time. She denies any pain within the pelvis area nausea abdominal cramping or diarrhea. She denies any vaginal bleeding. She has noticed some dysuria and will present to the lab for urinalysis and culture after her followup visit.  denies any rectal bleeding.                              ALLERGIES:  is allergic to indomethacin.  Meds: Current Outpatient Prescriptions  Medication Sig Dispense Refill  . albuterol (PROVENTIL HFA;VENTOLIN HFA) 108 (90 BASE) MCG/ACT inhaler Inhale 2 puffs into the lungs every 6 (six) hours as needed for wheezing.      Marland Kitchen aspirin 81 MG tablet Take 81 mg by mouth daily.      . furosemide (LASIX) 80 MG tablet Take 40 mg by mouth daily.      Marland Kitchen lisinopril (PRINIVIL,ZESTRIL) 20 MG tablet Take 10 mg by mouth every morning.       . loperamide (IMODIUM) 2 MG capsule Take by mouth as needed for diarrhea or loose stools.      . polyethylene glycol (MIRALAX / GLYCOLAX) packet Take 17 g by mouth as needed.      . polyvinyl alcohol (LIQUIFILM TEARS) 1.4 % ophthalmic solution Place 1 drop into both eyes as needed for dry eyes.      Marland Kitchen temazepam (RESTORIL) 15 MG capsule       . Vitamin D, Ergocalciferol, (DRISDOL) 50000 UNITS CAPS Take 50,000 Units by mouth every 14 (fourteen) days. Sunday      . acetaminophen  (TYLENOL) 500 MG tablet Take 1,000 mg by mouth every 6 (six) hours as needed (Pain).      Marland Kitchen docusate sodium (COLACE) 100 MG capsule Take 1 capsule (100 mg total) by mouth 2 (two) times daily as needed (take to keep stool soft.).  60 capsule  0  . magnesium citrate SOLN Take 1 Bottle by mouth as needed for severe constipation.      . magnesium hydroxide (MILK OF MAGNESIA) 400 MG/5ML suspension Take 15 mLs by mouth daily as needed for mild constipation.      Marland Kitchen morphine (MS CONTIN) 30 MG 12 hr tablet Take 1 tablet (30 mg total) by mouth every 12 (twelve) hours.  30 tablet  0  . ondansetron (ZOFRAN ODT) 8 MG disintegrating tablet Take 1 tablet (8 mg total) by mouth every 12 (twelve) hours as needed for nausea or vomiting.  20 tablet  0  . oxyCODONE (ROXICODONE) 5 MG immediate release tablet Take 1 tablet (5 mg total) by mouth every 4 (four) hours as needed for breakthrough pain.  60 tablet  0  . phenazopyridine (PYRIDIUM) 100 MG tablet  Take 1 tablet (100 mg total) by mouth 3 (three) times daily.  30 tablet  2   No current facility-administered medications for this encounter.    Physical Findings: The patient is in no acute distress. Patient is alert and oriented.  height is 5' (1.524 m) and weight is 145 lb (65.772 kg). Her oral temperature is 97.5 F (36.4 C). Her blood pressure is 171/68 and her pulse is 65. Her respiration is 24. Marland Kitchen  No palpable supraclavicular or axillary adenopathy. Lungs are clear to auscultation. The heart has a regular rhythm and rate. The abdomen is soft and nontender with normal bowel sounds. On pelvic examination the external genitalia are unremarkable. A speculum exam is performed. There are no mucosal lesions noted in the vaginal vault. On bimanual examination the patient continues to have a palpable mass along the the anterior vaginal wall. No obvious mucosal involvement. The mass appears to be slightly smaller on exam  Lab Findings: Lab Results  Component Value Date    WBC 4.2 09/11/2013   HGB 10.9* 09/11/2013   HCT 33.0* 09/11/2013   MCV 91.4 09/11/2013   PLT 259 09/11/2013    Radiographic Findings: No results found.  Impression:   The patient has had good improvement in her pain. She continues to have a palpable mass along the anterior pelvis region.  Plan:  Routine blood work. Patient also have a urinalysis and culture performed in light of her dysuria. Patient will be scheduled for a CT scan of the pelvis approximately one month from now to assess her response to treatment.  The patient was given a vaginal dilator and instructions on its use in light of her pelvic radiation therapy  ____________________________________ Blair Promise, MD

## 2013-10-29 NOTE — Telephone Encounter (Signed)
Katherin left a message returning our call.  Notified Romie Jumper who will call her back.

## 2013-10-29 NOTE — Telephone Encounter (Signed)
CALLED PATIENT TO INFORM OF CT FOR 12-01-13 - ARRIVAL TIME - 2:15 PM @ [facility], LVM FOR A RETURN CALL

## 2013-10-29 NOTE — Progress Notes (Signed)
[name] here for follow up after treatment to the vaginal/bladder mass.  She denies pain and skin irritation.  She does report burning with urination that started a few weeks ago.  She reports diarrhea "on and off."  She takes Imodium PRN.  She denies rectal/vaginal bleeding.  She reports a small amount of vaginal discharge.  She reports a good appetite and has gained 4 lbs since 9/15.  She reports her energy level is better.

## 2013-10-29 NOTE — Progress Notes (Signed)
[name] was given a size S+ vaginal dilator and Surgilube.  Advised her to apply the Surgilube to the dilator and to use it for 10 minutes three times a week (Monday, Wednesday and Friday) and then to clean with soap and water.  She verbalized understanding.

## 2013-10-31 LAB — URINE CULTURE

## 2013-11-02 ENCOUNTER — Telehealth: Payer: Self-pay | Admitting: Oncology

## 2013-11-02 NOTE — Telephone Encounter (Signed)
Left a message and requested a return call regarding her urine culture results.

## 2013-11-03 ENCOUNTER — Telehealth: Payer: Self-pay | Admitting: Oncology

## 2013-11-03 NOTE — Telephone Encounter (Signed)
[name] called back.  Notified her of her negative urine culture results.  She verbalized understanding.

## 2013-12-01 ENCOUNTER — Ambulatory Visit (HOSPITAL_COMMUNITY)
Admission: RE | Admit: 2013-12-01 | Discharge: 2013-12-01 | Disposition: A | Discharge status: Home / Self Care | Payer: Medicare Other | Source: Ambulatory Visit | Attending: Radiation Oncology | Admitting: Radiation Oncology

## 2013-12-01 ENCOUNTER — Encounter (HOSPITAL_COMMUNITY): Payer: Self-pay

## 2013-12-01 DIAGNOSIS — C52 Malignant neoplasm of vagina: Secondary | ICD-10-CM | POA: Diagnosis not present

## 2013-12-01 DIAGNOSIS — C4492 Squamous cell carcinoma of skin, unspecified: Secondary | ICD-10-CM

## 2013-12-01 MED ORDER — IOHEXOL 300 MG/ML  SOLN
100.0000 mL | Freq: Once | INTRAMUSCULAR | Status: AC | PRN
  Administered 2013-12-01: 100 mL via INTRAVENOUS

## 2014-01-28 ENCOUNTER — Ambulatory Visit
Admission: RE | Admit: 2014-01-28 | Discharge: 2014-01-28 | Disposition: A | Discharge status: Home / Self Care | Payer: Medicare Other | Source: Ambulatory Visit | Attending: Radiation Oncology | Admitting: Radiation Oncology

## 2014-01-28 ENCOUNTER — Encounter: Payer: Self-pay | Admitting: Radiation Oncology

## 2014-01-28 VITALS — BP 174/70 | HR 75 | Temp 98.0°F | Resp 24 | Ht 60.0 in | Wt 149.8 lb

## 2014-01-28 DIAGNOSIS — C4492 Squamous cell carcinoma of skin, unspecified: Secondary | ICD-10-CM

## 2014-01-28 NOTE — Progress Notes (Signed)
[name] here for follow up.  She denies pain.  She denies difficulty urinating and skin irritation.  She reports occasional constipation and takes miralax as needed.  She denies any vaginal discharge and bleeding.  She reports nausea every once in a while.  She reports her appetite is back and she has gained 4 lbs since October.  She reports her fatigue has improved.  She was short of breath after walking from the waiting room to the exam room.  Her oxygen saturation was 92% on room air.  BP 174/70 mmHg  Pulse 75  Temp(Src) 98 F (36.7 C) (Oral)  Resp 24  Ht 5' (1.524 m)  Wt 149 lb 12.8 oz (67.949 kg)  BMI 29.26 kg/m2  SpO2 92%

## 2014-01-28 NOTE — Progress Notes (Signed)
Radiation Oncology         [phone] ________________________________  Name: [name] MRN: [id number]  Date: 01/28/2014  DOB: 1925-09-18  Follow-Up Visit Note  CC: Leonard Downing, MD  Everitt Amber, MD    ICD-9-CM ICD-10-CM   1. Squamous cell carcinoma of unknown origin 199.1 C80.1     Diagnosis:    Squamous cell carcinoma of unknown origin presenting in the bladder and upper vaginal area  Interval Since Last Radiation:  4  months  Narrative:  The patient returns today for routine follow-up.  sHe is doing well at this time and without complaints. She denies any dysuria or hematuria or pelvic low back or flank pain. She continues to use her vaginal dilator as recommended. She denies any vaginal bleeding.  The patient did undergo a CT scan of the abdomen and pelvis after her radiation therapy showing significant shrinkage of her pelvic mass.                            ALLERGIES:  is allergic to indomethacin.  Meds: Current Outpatient Prescriptions  Medication Sig Dispense Refill  . acetaminophen (TYLENOL) 500 MG tablet Take 1,000 mg by mouth every 6 (six) hours as needed (Pain).    Marland Kitchen albuterol (PROVENTIL HFA;VENTOLIN HFA) 108 (90 BASE) MCG/ACT inhaler Inhale 2 puffs into the lungs every 6 (six) hours as needed for wheezing.    Marland Kitchen aspirin 81 MG tablet Take 81 mg by mouth daily.    . furosemide (LASIX) 80 MG tablet Take 40 mg by mouth daily.    Marland Kitchen lisinopril (PRINIVIL,ZESTRIL) 20 MG tablet Take 10 mg by mouth every morning.     . polyethylene glycol (MIRALAX / GLYCOLAX) packet Take 17 g by mouth as needed.    . polyvinyl alcohol (LIQUIFILM TEARS) 1.4 % ophthalmic solution Place 1 drop into both eyes as needed for dry eyes.    Marland Kitchen temazepam (RESTORIL) 15 MG capsule     . Vitamin D, Ergocalciferol, (DRISDOL) 50000 UNITS CAPS Take 50,000 Units by mouth every 14 (fourteen) days. Sunday    . docusate sodium (COLACE) 100 MG capsule Take 1 capsule (100 mg total) by mouth 2 (two) times  daily as needed (take to keep stool soft.). (Patient not taking: Reported on 01/28/2014) 60 capsule 0  . loperamide (IMODIUM) 2 MG capsule Take by mouth as needed for diarrhea or loose stools.    . magnesium citrate SOLN Take 1 Bottle by mouth as needed for severe constipation.    . magnesium hydroxide (MILK OF MAGNESIA) 400 MG/5ML suspension Take 15 mLs by mouth daily as needed for mild constipation.    Marland Kitchen morphine (MS CONTIN) 30 MG 12 hr tablet Take 1 tablet (30 mg total) by mouth every 12 (twelve) hours. (Patient not taking: Reported on 01/28/2014) 30 tablet 0  . ondansetron (ZOFRAN ODT) 8 MG disintegrating tablet Take 1 tablet (8 mg total) by mouth every 12 (twelve) hours as needed for nausea or vomiting. (Patient not taking: Reported on 01/28/2014) 20 tablet 0  . oxyCODONE (ROXICODONE) 5 MG immediate release tablet Take 1 tablet (5 mg total) by mouth every 4 (four) hours as needed for breakthrough pain. (Patient not taking: Reported on 01/28/2014) 60 tablet 0  . phenazopyridine (PYRIDIUM) 100 MG tablet Take 1 tablet (100 mg total) by mouth 3 (three) times daily. (Patient not taking: Reported on 01/28/2014) 30 tablet 2   No current facility-administered medications for this  encounter.    Physical Findings: The patient is in no acute distress. Patient is alert and oriented.  height is 5' (1.524 m) and weight is 149 lb 12.8 oz (67.949 kg). Her oral temperature is 98 F (36.7 C). Her blood pressure is 174/70 and her pulse is 75. Her respiration is 24 and oxygen saturation is 92%. .  No palpable subclavicular or axillary adenopathy. The lungs are clear to auscultation. The heart has a regular rhythm and rate. The abdomen is soft and nontender with normal bowel sounds. The patient has a easily reducible hernia just left of the umbilicus. No inguinal adenopathy appreciated. On pelvic examination the external genitalia are unremarkable. Speculum exam is performed. There are no mucosal lesions noted within the  vaginal vault. On bimanual and rectovaginal examination I am unable to palpate any residual mass along the anterior vaginal wall/anterior pelvis region.  Lab Findings: Lab Results  Component Value Date   WBC 3.7* 10/29/2013   HGB 11.8 10/29/2013   HCT 32.4* 10/29/2013   MCV 101.6* 10/29/2013   PLT 214 10/29/2013    Radiographic Findings: No results found.  Impression:  Patient is doing well with CT and clinical exam findings showing significant shrinkage of her pelvic mass.  Plan:  Routine follow-up in 6 months. In the interim the patient will be seen by gynecologic oncology. The patient's MCV and MCH was elevated with blood work in October and she should likely have additional blood work on her return visit if not performed through her primary care physician. ____________________________________ Blair Promise, MD

## 2014-02-13 ENCOUNTER — Emergency Department (HOSPITAL_COMMUNITY): Payer: Medicare Other

## 2014-02-13 ENCOUNTER — Encounter (HOSPITAL_COMMUNITY): Payer: Self-pay | Admitting: *Deleted

## 2014-02-13 ENCOUNTER — Inpatient Hospital Stay (HOSPITAL_COMMUNITY)
Admission: EM | Admit: 2014-02-13 | Discharge: 2014-02-17 | DRG: 871 | Disposition: A | Discharge status: Home Health | Payer: Medicare Other | Attending: Internal Medicine | Admitting: Internal Medicine

## 2014-02-13 DIAGNOSIS — I493 Ventricular premature depolarization: Secondary | ICD-10-CM | POA: Diagnosis present

## 2014-02-13 DIAGNOSIS — A419 Sepsis, unspecified organism: Principal | ICD-10-CM | POA: Diagnosis present

## 2014-02-13 DIAGNOSIS — I10 Essential (primary) hypertension: Secondary | ICD-10-CM | POA: Diagnosis present

## 2014-02-13 DIAGNOSIS — G479 Sleep disorder, unspecified: Secondary | ICD-10-CM | POA: Diagnosis present

## 2014-02-13 DIAGNOSIS — E049 Nontoxic goiter, unspecified: Secondary | ICD-10-CM | POA: Diagnosis present

## 2014-02-13 DIAGNOSIS — Z7982 Long term (current) use of aspirin: Secondary | ICD-10-CM

## 2014-02-13 DIAGNOSIS — Z6826 Body mass index (BMI) 26.0-26.9, adult: Secondary | ICD-10-CM

## 2014-02-13 DIAGNOSIS — Z803 Family history of malignant neoplasm of breast: Secondary | ICD-10-CM | POA: Diagnosis not present

## 2014-02-13 DIAGNOSIS — J189 Pneumonia, unspecified organism: Secondary | ICD-10-CM | POA: Diagnosis present

## 2014-02-13 DIAGNOSIS — Z886 Allergy status to analgesic agent status: Secondary | ICD-10-CM

## 2014-02-13 DIAGNOSIS — E663 Overweight: Secondary | ICD-10-CM | POA: Diagnosis present

## 2014-02-13 DIAGNOSIS — R7989 Other specified abnormal findings of blood chemistry: Secondary | ICD-10-CM

## 2014-02-13 DIAGNOSIS — K219 Gastro-esophageal reflux disease without esophagitis: Secondary | ICD-10-CM | POA: Diagnosis present

## 2014-02-13 DIAGNOSIS — Z8541 Personal history of malignant neoplasm of cervix uteri: Secondary | ICD-10-CM | POA: Diagnosis not present

## 2014-02-13 DIAGNOSIS — R0602 Shortness of breath: Secondary | ICD-10-CM | POA: Diagnosis present

## 2014-02-13 DIAGNOSIS — I5043 Acute on chronic combined systolic (congestive) and diastolic (congestive) heart failure: Secondary | ICD-10-CM | POA: Diagnosis present

## 2014-02-13 DIAGNOSIS — Z8701 Personal history of pneumonia (recurrent): Secondary | ICD-10-CM | POA: Diagnosis not present

## 2014-02-13 DIAGNOSIS — R778 Other specified abnormalities of plasma proteins: Secondary | ICD-10-CM | POA: Diagnosis present

## 2014-02-13 DIAGNOSIS — Z8249 Family history of ischemic heart disease and other diseases of the circulatory system: Secondary | ICD-10-CM | POA: Diagnosis not present

## 2014-02-13 DIAGNOSIS — Z923 Personal history of irradiation: Secondary | ICD-10-CM | POA: Diagnosis not present

## 2014-02-13 DIAGNOSIS — J9601 Acute respiratory failure with hypoxia: Secondary | ICD-10-CM | POA: Diagnosis present

## 2014-02-13 DIAGNOSIS — Z9049 Acquired absence of other specified parts of digestive tract: Secondary | ICD-10-CM | POA: Diagnosis present

## 2014-02-13 DIAGNOSIS — J449 Chronic obstructive pulmonary disease, unspecified: Secondary | ICD-10-CM | POA: Diagnosis present

## 2014-02-13 DIAGNOSIS — I5022 Chronic systolic (congestive) heart failure: Secondary | ICD-10-CM | POA: Diagnosis present

## 2014-02-13 DIAGNOSIS — R0600 Dyspnea, unspecified: Secondary | ICD-10-CM | POA: Diagnosis present

## 2014-02-13 DIAGNOSIS — Z87891 Personal history of nicotine dependence: Secondary | ICD-10-CM | POA: Diagnosis not present

## 2014-02-13 DIAGNOSIS — I5023 Acute on chronic systolic (congestive) heart failure: Secondary | ICD-10-CM

## 2014-02-13 LAB — URINALYSIS, ROUTINE W REFLEX MICROSCOPIC
Glucose, UA: NEGATIVE mg/dL
KETONES UR: 40 mg/dL — AB
NITRITE: NEGATIVE
Protein, ur: 100 mg/dL — AB
Specific Gravity, Urine: 1.025 (ref 1.005–1.030)
Urobilinogen, UA: 1 mg/dL (ref 0.0–1.0)
pH: 6 (ref 5.0–8.0)

## 2014-02-13 LAB — PROTIME-INR
INR: 1.22 (ref 0.00–1.49)
PROTHROMBIN TIME: 15.5 s — AB (ref 11.6–15.2)

## 2014-02-13 LAB — CBC
HCT: 35 % — ABNORMAL LOW (ref 36.0–46.0)
HEMATOCRIT: 37.1 % (ref 36.0–46.0)
Hemoglobin: 12.9 g/dL (ref 12.0–15.0)
Hemoglobin: 11.5 g/dL — ABNORMAL LOW (ref 12.0–15.0)
MCH: 31.2 pg (ref 26.0–34.0)
MCH: 30.4 pg (ref 26.0–34.0)
MCHC: 34.8 g/dL (ref 30.0–36.0)
MCHC: 32.9 g/dL (ref 30.0–36.0)
MCV: 89.6 fL (ref 78.0–100.0)
MCV: 92.6 fL (ref 78.0–100.0)
Platelets: 236 10*3/uL (ref 150–400)
Platelets: 236 10*3/uL (ref 150–400)
RBC: 4.14 MIL/uL (ref 3.87–5.11)
RBC: 3.78 MIL/uL — ABNORMAL LOW (ref 3.87–5.11)
RDW: 12.6 % (ref 11.5–15.5)
RDW: 12.6 % (ref 11.5–15.5)
WBC: 15.2 10*3/uL — AB (ref 4.0–10.5)
WBC: 14.6 10*3/uL — ABNORMAL HIGH (ref 4.0–10.5)

## 2014-02-13 LAB — TSH: TSH: 0.521 u[IU]/mL (ref 0.350–4.500)

## 2014-02-13 LAB — URINE MICROSCOPIC-ADD ON

## 2014-02-13 LAB — BASIC METABOLIC PANEL
ANION GAP: 10 (ref 5–15)
BUN: 13 mg/dL (ref 6–23)
CHLORIDE: 100 mmol/L (ref 96–112)
CO2: 27 mmol/L (ref 19–32)
Calcium: 8.7 mg/dL (ref 8.4–10.5)
Creatinine, Ser: 0.83 mg/dL (ref 0.50–1.10)
GFR calc non Af Amer: 62 mL/min — ABNORMAL LOW (ref >90)
GFR, EST AFRICAN AMERICAN: 72 mL/min — AB (ref >90)
GLUCOSE: 131 mg/dL — AB (ref 70–99)
Potassium: 3.8 mmol/L (ref 3.5–5.1)
SODIUM: 137 mmol/L (ref 135–145)

## 2014-02-13 LAB — BRAIN NATRIURETIC PEPTIDE: B NATRIURETIC PEPTIDE 5: 938.8 pg/mL — AB (ref 0.0–100.0)

## 2014-02-13 LAB — TROPONIN I: Troponin I: 0.07 ng/mL — ABNORMAL HIGH (ref <0.031)

## 2014-02-13 LAB — I-STAT TROPONIN, ED: Troponin i, poc: 0.04 ng/mL (ref 0.00–0.08)

## 2014-02-13 LAB — CREATININE, SERUM
Creatinine, Ser: 1 mg/dL (ref 0.50–1.10)
GFR calc non Af Amer: 50 mL/min — ABNORMAL LOW (ref >90)
GFR, EST AFRICAN AMERICAN: 57 mL/min — AB (ref >90)

## 2014-02-13 MED ORDER — IOHEXOL 350 MG/ML SOLN
100.0000 mL | Freq: Once | INTRAVENOUS | Status: AC | PRN
  Administered 2014-02-13: 100 mL via INTRAVENOUS

## 2014-02-13 MED ORDER — SODIUM CHLORIDE 0.9 % IJ SOLN: 3.0000 mL | INTRAMUSCULAR | Status: DC | PRN

## 2014-02-13 MED ORDER — ACETAMINOPHEN 500 MG PO TABS
1000.0000 mg | ORAL_TABLET | Freq: Four times a day (QID) | ORAL | Status: DC | PRN
  Administered 2014-02-15 – 2014-02-17 (×3): 1000 mg via ORAL
  Filled 2014-02-13 (×3): qty 2

## 2014-02-13 MED ORDER — DEXTROSE 5 % IV SOLN
1.0000 g | Freq: Once | INTRAVENOUS | Status: AC
  Administered 2014-02-13: 1 g via INTRAVENOUS
  Filled 2014-02-13: qty 10

## 2014-02-13 MED ORDER — CETYLPYRIDINIUM CHLORIDE 0.05 % MT LIQD
7.0000 mL | Freq: Two times a day (BID) | OROMUCOSAL | Status: DC
  Administered 2014-02-14 – 2014-02-16 (×3): 7 mL via OROMUCOSAL

## 2014-02-13 MED ORDER — IPRATROPIUM-ALBUTEROL 0.5-2.5 (3) MG/3ML IN SOLN
3.0000 mL | Freq: Once | RESPIRATORY_TRACT | Status: AC
  Administered 2014-02-13: 3 mL via RESPIRATORY_TRACT
  Filled 2014-02-13: qty 3

## 2014-02-13 MED ORDER — DEXTROSE 5 % IV SOLN
500.0000 mg | INTRAVENOUS | Status: DC
  Administered 2014-02-14: 500 mg via INTRAVENOUS
  Filled 2014-02-13 (×2): qty 500

## 2014-02-13 MED ORDER — ALBUTEROL SULFATE (2.5 MG/3ML) 0.083% IN NEBU: 2.5000 mg | INHALATION_SOLUTION | Freq: Four times a day (QID) | RESPIRATORY_TRACT | Status: DC | PRN

## 2014-02-13 MED ORDER — FUROSEMIDE 10 MG/ML IJ SOLN
20.0000 mg | Freq: Two times a day (BID) | INTRAMUSCULAR | Status: DC
  Administered 2014-02-13 – 2014-02-17 (×8): 20 mg via INTRAVENOUS
  Filled 2014-02-13 (×14): qty 2

## 2014-02-13 MED ORDER — ASPIRIN EC 81 MG PO TBEC
81.0000 mg | DELAYED_RELEASE_TABLET | Freq: Every day | ORAL | Status: DC
  Administered 2014-02-13 – 2014-02-17 (×5): 81 mg via ORAL
  Filled 2014-02-13 (×5): qty 1

## 2014-02-13 MED ORDER — POLYETHYLENE GLYCOL 3350 17 G PO PACK
17.0000 g | PACK | Freq: Every day | ORAL | Status: DC | PRN
  Filled 2014-02-13: qty 1

## 2014-02-13 MED ORDER — SODIUM CHLORIDE 0.9 % IV SOLN
INTRAVENOUS | Status: AC
  Administered 2014-02-13: 23:00:00 via INTRAVENOUS
  Administered 2014-02-13: 75 mL/h via INTRAVENOUS

## 2014-02-13 MED ORDER — CEFTRIAXONE SODIUM IN DEXTROSE 20 MG/ML IV SOLN
1.0000 g | INTRAVENOUS | Status: DC
  Administered 2014-02-14 – 2014-02-16 (×3): 1 g via INTRAVENOUS
  Filled 2014-02-13 (×4): qty 50

## 2014-02-13 MED ORDER — LISINOPRIL 10 MG PO TABS
10.0000 mg | ORAL_TABLET | Freq: Every morning | ORAL | Status: DC
  Administered 2014-02-14 – 2014-02-17 (×3): 10 mg via ORAL
  Filled 2014-02-13 (×4): qty 1

## 2014-02-13 MED ORDER — SODIUM CHLORIDE 0.9 % IJ SOLN
3.0000 mL | Freq: Two times a day (BID) | INTRAMUSCULAR | Status: DC
  Administered 2014-02-14 – 2014-02-17 (×7): 3 mL via INTRAVENOUS

## 2014-02-13 MED ORDER — ENOXAPARIN SODIUM 40 MG/0.4ML ~~LOC~~ SOLN
40.0000 mg | SUBCUTANEOUS | Status: DC
  Administered 2014-02-13 – 2014-02-16 (×4): 40 mg via SUBCUTANEOUS
  Filled 2014-02-13 (×5): qty 0.4

## 2014-02-13 MED ORDER — SODIUM CHLORIDE 0.9 % IV SOLN: 250.0000 mL | INTRAVENOUS | Status: DC | PRN

## 2014-02-13 MED ORDER — DEXTROSE 5 % IV SOLN
500.0000 mg | Freq: Once | INTRAVENOUS | Status: AC
  Administered 2014-02-13: 500 mg via INTRAVENOUS
  Filled 2014-02-13: qty 500

## 2014-02-13 MED ORDER — ONDANSETRON HCL 4 MG/2ML IJ SOLN
4.0000 mg | Freq: Once | INTRAMUSCULAR | Status: AC
  Administered 2014-02-13: 4 mg via INTRAVENOUS
  Filled 2014-02-13: qty 2

## 2014-02-13 MED ORDER — ALBUTEROL SULFATE HFA 108 (90 BASE) MCG/ACT IN AERS: 2.0000 | INHALATION_SPRAY | Freq: Four times a day (QID) | RESPIRATORY_TRACT | Status: DC | PRN

## 2014-02-13 MED ORDER — ASPIRIN 81 MG PO CHEW
324.0000 mg | CHEWABLE_TABLET | Freq: Once | ORAL | Status: AC
  Administered 2014-02-13: 324 mg via ORAL
  Filled 2014-02-13: qty 4

## 2014-02-13 NOTE — ED Notes (Signed)
EDP at the bedside.  ?

## 2014-02-13 NOTE — ED Notes (Signed)
Admitting Provider at the bedside.  

## 2014-02-13 NOTE — Consult Note (Signed)
Reason for Consult: Thyroid enlargement Referring Physician: Hospitalist  [name] is an 81 y.o. female.  HPI: 81 year old who's had a history of a thyroid goiter for many years. She actually has had radioactive iodine in the past for the enlargement. She has noticed that there has been enlarged area in her right neck for many years. She does not really feel like it is any significantly larger. Apparently she had a scan performed in July 2015 which has not changed relative to this new CAT scan that she had it this hospitalization. She has no pain. She doesn't have any difficulty swallowing that's any thing new. She does have difficulty with pills occasionally but that has been a many years problem. She has had no stridor or breathing difficulties until this episode of pneumonia. She underwent a PET scan for her cervical carcinoma and she does have a component of the right thyroid that does have a fairly significant metabolic intensity.  Past Medical History  Diagnosis Date  . Hypertension   . COPD (chronic obstructive pulmonary disease)   . CHF (congestive heart failure)   . SOB (shortness of breath)     'all the time" (01/02/2012)  . Arthritis     "right shoulder" (01/02/2012)  . Pneumonia 01/02/2012; ? 2009  . GERD (gastroesophageal reflux disease)     occasional  . Anemia     years ago  . Diverticulitis of colon with perforation 01/14/2012    That is post sigmoid resection with [facility] pouch and end colostomy   . CAP (community acquired pneumonia) 01/02/2012    Lower lobe   . Intra-abdominal abscess 01/14/2012  . Pneumatosis of intestines 01/14/2012  . UTI (lower urinary tract infection) 01/03/2012  . Bladder mass   . History of kidney stones     X 1  . Difficulty sleeping   . Goiter     radioactive iodine ablation/notes 01/02/2012  . Hypothyroidism     "I have taken Synthroid before" (01/02/2012)   HAS HAD RADIOACTIVE IODINE TX 2 YR S AGO  . Radiation 08/10/13-09/15/13    57 fray to  vaginal/bladder mass    Past Surgical History  Procedure Laterality Date  . Cataract extraction w/ intraocular lens  implant, bilateral  ?1990's    Bil  . Appendectomy  1980's    "when they did hysterectomy" (01/02/2012)  . Abdominal hysterectomy  1980's  . Colostomy revision N/A 03/12/2012    Procedure: COLON RESECTION SIGMOID ;  Surgeon: Ralene Ok, MD;  Location: [place];  Service: General;  Laterality: N/A;  . Colostomy N/A 03/12/2012    Procedure: COLOSTOMY;  Surgeon: Ralene Ok, MD;  Location: [place];  Service: General;  Laterality: N/A;  . Colostomy takedown N/A 10/21/2012    Procedure: LAPAROSCOPIC COLOSTOMY TAKEDOWN AND HARTMANS ANASTOMOSIS, rigid proctoscopy;  Surgeon: Ralene Ok, MD;  Location: [facility];  Service: General;  Laterality: N/A;  . Lysis of adhesion N/A 10/21/2012    Procedure: LYSIS OF ADHESION;  Surgeon: Ralene Ok, MD;  Location: [facility];  Service: General;  Laterality: N/A;  . Transurethral resection of bladder tumor with gyrus (turbt-gyrus) N/A 06/30/2013    Procedure: TRANSURETHRAL RESECTION OF BLADDER TUMOR WITH GYRUS (TURBT-GYRUS)/VAGINAL BIOPSY;  Surgeon: Ardis Hughs, MD;  Location: [facility];  Service: Urology;  Laterality: N/A;    Family History  Problem Relation Age of Onset  . Heart disease    . Cancer Sister     Breast  . Heart disease Mother  Social History:  reports that she quit smoking about 23 years ago. Her smoking use included Cigarettes. She has a 4.2 pack-year smoking history. She has never used smokeless tobacco. She reports that she does not drink alcohol or use illicit drugs.  Allergies:  Allergies  Allergen Reactions  . Indomethacin Anaphylaxis and Other (See Comments)    "took it fine for awhile; one day I stopped breathing and I ended up in the hospital" (01/02/2012)    Medications: I have reviewed the patient's current medications.  Results for orders placed or performed during the hospital encounter of  02/13/14 (from the past 48 hour(s))  CBC     Status: Abnormal   Collection Time: 02/13/14  8:38 AM  Result Value Ref Range   WBC 15.2 (H) 4.0 - 10.5 K/uL   RBC 4.14 3.87 - 5.11 MIL/uL   Hemoglobin 12.9 12.0 - 15.0 g/dL   HCT 37.1 36.0 - 46.0 %   MCV 89.6 78.0 - 100.0 fL   MCH 31.2 26.0 - 34.0 pg   MCHC 34.8 30.0 - 36.0 g/dL    Comment: CORRECTED FOR COLD AGGLUTININS   RDW 12.6 11.5 - 15.5 %   Platelets 236 150 - 400 K/uL  Basic metabolic panel     Status: Abnormal   Collection Time: 02/13/14  8:38 AM  Result Value Ref Range   Sodium 137 135 - 145 mmol/L   Potassium 3.8 3.5 - 5.1 mmol/L   Chloride 100 96 - 112 mmol/L   CO2 27 19 - 32 mmol/L   Glucose, Bld 131 (H) 70 - 99 mg/dL   BUN 13 6 - 23 mg/dL   Creatinine, Ser 0.83 0.50 - 1.10 mg/dL   Calcium 8.7 8.4 - 10.5 mg/dL   GFR calc non Af Amer 62 (L) >90 mL/min   GFR calc Af Amer 72 (L) >90 mL/min    Comment: (NOTE) The eGFR has been calculated using the CKD EPI equation. This calculation has not been validated in all clinical situations. eGFR's persistently <90 mL/min signify possible Chronic Kidney Disease.    Anion gap 10 5 - 15  BNP (order ONLY if patient complains of dyspnea/SOB AND you have documented it for THIS visit)     Status: Abnormal   Collection Time: 02/13/14  8:38 AM  Result Value Ref Range   B Natriuretic Peptide 938.8 (H) 0.0 - 100.0 pg/mL  Troponin I     Status: Abnormal   Collection Time: 02/13/14  8:38 AM  Result Value Ref Range   Troponin I 0.07 (H) <0.031 ng/mL    Comment:        PERSISTENTLY INCREASED TROPONIN VALUES IN THE RANGE OF 0.04-0.49 ng/mL CAN BE SEEN IN:       -UNSTABLE ANGINA       -CONGESTIVE HEART FAILURE       -MYOCARDITIS       -CHEST TRAUMA       -ARRYHTHMIAS       -LATE PRESENTING MYOCARDIAL INFARCTION       -COPD   CLINICAL FOLLOW-UP RECOMMENDED.   Protime-INR     Status: Abnormal   Collection Time: 02/13/14  8:38 AM  Result Value Ref Range   Prothrombin Time 15.5 (H)  11.6 - 15.2 seconds   INR 1.22 0.00 - 1.49  I-stat troponin, ED (not at [facility])     Status: None   Collection Time: 02/13/14  9:04 AM  Result Value Ref Range   Troponin i, poc 0.04 0.00 -  0.08 ng/mL   Comment 3            Comment: Due to the release kinetics of cTnI, a negative result within the first hours of the onset of symptoms does not rule out myocardial infarction with certainty. If myocardial infarction is still suspected, repeat the test at appropriate intervals.   Urinalysis, Routine w reflex microscopic     Status: Abnormal   Collection Time: 02/13/14 11:37 AM  Result Value Ref Range   Color, Urine AMBER (A) YELLOW    Comment: BIOCHEMICALS MAY BE AFFECTED BY COLOR   APPearance CLEAR CLEAR   Specific Gravity, Urine 1.025 1.005 - 1.030   pH 6.0 5.0 - 8.0   Glucose, UA NEGATIVE NEGATIVE mg/dL   Hgb urine dipstick TRACE (A) NEGATIVE   Bilirubin Urine SMALL (A) NEGATIVE   Ketones, ur 40 (A) NEGATIVE mg/dL   Protein, ur 100 (A) NEGATIVE mg/dL   Urobilinogen, UA 1.0 0.0 - 1.0 mg/dL   Nitrite NEGATIVE NEGATIVE   Leukocytes, UA SMALL (A) NEGATIVE  Urine microscopic-add on     Status: Abnormal   Collection Time: 02/13/14 11:37 AM  Result Value Ref Range   Squamous Epithelial / LPF RARE RARE   WBC, UA 3-6 <3 WBC/hpf   RBC / HPF 0-2 <3 RBC/hpf   Bacteria, UA RARE RARE   Casts WBC CAST (A) NEGATIVE   Urine-Other MUCOUS PRESENT     Comment: LESS THAN 10 mL OF URINE SUBMITTED  Creatinine, serum     Status: Abnormal   Collection Time: 02/13/14  6:15 PM  Result Value Ref Range   Creatinine, Ser 1.00 0.50 - 1.10 mg/dL   GFR calc non Af Amer 50 (L) >90 mL/min   GFR calc Af Amer 57 (L) >90 mL/min    Comment: (NOTE) The eGFR has been calculated using the CKD EPI equation. This calculation has not been validated in all clinical situations. eGFR's persistently <90 mL/min signify possible Chronic Kidney Disease.     Ct Soft Tissue Neck W Contrast  02/13/2014   CLINICAL DATA:   Shortness of breath.  EXAM: CT NECK WITH CONTRAST  TECHNIQUE: Multidetector CT imaging of the neck was performed using the standard protocol following the bolus administration of intravenous contrast.  CONTRAST:  142mL OMNIPAQUE IOHEXOL 350 MG/ML SOLN  COMPARISON:  PET scan 07/14/2013.  FINDINGS: Pharynx and larynx: LEFT-to-RIGHT displacement air column larynx by large goiter described below. In the or pharyngeal region there is mass effect on the hypopharynx from the RIGHT, also described below. Potentially symptomatic narrowing of the airway in the supraglottic region. No mucosal lesion is evident.  Salivary glands: Unremarkable.  Thyroid: Massive goiter. Heterogeneous appearance with multiple calcifications. Inferiorly the gland is larger on the LEFT, with early BILATERAL substernal extension. As noted from PET scan, there is a hypermetabolic component which extends superiorly from the RIGHT lobe having cross-section 54 x 39 mm. This lesion has not dramatically progressed since July of 2015 but remains concerning for malignant degeneration of goiter. ENT consultation with tissue sampling is warranted.  Lymph nodes: No pathologic adenopathy.  Vascular: Lateral displacement of the carotid sheath on the RIGHT. Minor atheromatous changes bilaterally.  Limited intracranial: Negative.  Visualized orbits: BILATERAL cataract extraction. No acute features.  Mastoids and visualized paranasal sinuses: Moderate sphenoid sinus disease with layering fluid suggesting acuity. No other significant sinus opacity. No mastoid fluid.  Skeleton: For a minor degenerative disc disease. No osseous lesions.  Upper chest: Reported separately.  IMPRESSION:  Massive goiter, with the dominant hypermetabolic (on prior PET) component on the RIGHT potentially compromising the supraglottic airway having cross-sectional measurements of 54 x 39 mm. Concern raised for malignant degeneration of goiter with thyroid carcinoma. ENT consultation with  tissue sampling is warranted.   Electronically Signed   By: Rolla Flatten M.D.   On: 02/13/2014 12:31   Ct Angio Chest Pe W/cm &/or Wo Cm  02/13/2014   CLINICAL DATA:  Dyspnea Worsening SOB since yesterday Productive cough Chest pain Hx:Hx Squamous cell ca HTN COPD CHF Hysterectomy  EXAM: CT ANGIOGRAPHY CHEST WITH CONTRAST  TECHNIQUE: Multidetector CT imaging of the chest was performed using the standard protocol during bolus administration of intravenous contrast. Multiplanar CT image reconstructions and MIPs were obtained to evaluate the vascular anatomy.  CONTRAST:  136mL OMNIPAQUE IOHEXOL 350 MG/ML SOLN  COMPARISON:  07/14/2013 AND EARLIER STUDIES  FINDINGS: The svc is patent. Mild four-chamber cardiac enlargement. Mild reflux from right atrium into central hepatic veins. rv/lv ratio less than 1, normal. dilated central pulmonary arteries. satisfactory opacification of pulmonary arteries noted, and there is no evidence of pulmonary emboli. patient breathing during the acquisition degrades some of the images. patent superior and inferior pulmonary veins bilaterally. moderate coronary calcifications. Adequate contrast opacification of the thoracic aorta with no evidence of dissection, aneurysm, or stenosis. There is bovine variant brachiocephalic arch anatomy without proximal stenosis.  No pleural or pericardial effusion. Subcentimeter prevascular, AP window, and pretracheal lymph nodes, stable. Patchy airspace opacities in the inferior lingula and anterior left upper lobe, right suprahilar region in the right upper lobe, and in anterior and lateral basal segments right lower lobe, new since prior scan.  Review of the MIP images confirms the above findings. Massively enlarged thyroid measure in at least 11.6 cm transverse diameter with multiple nodules and at the coarse calcifications, incompletely visualized, resulting in mild rightward tracheal deviation without narrowing. Bridging osteophytes across multiple  levels in the mid and lower thoracic spine. Advanced degenerative disc disease at multiple contiguous levels in the thoracolumbar junction. Sternum is a intact. Complex 19 mm right renal cyst, incompletely visualized. Bilateral adrenal hyperplasia. Remainder visualized upper abdomen unremarkable.  IMPRESSION: 1. Negative for acute PE or thoracic aortic dissection. 2. Patchy airspace opacities in the lingula, anterior left upper lobe, right upper and lower lobes, new since prior study, suggesting multifocal pneumonia. Consider follow-up to confirm appropriate resolution. 3. Thyromegaly with multiple nodules, incompletely visualized. 4. Atherosclerosis, including aortic and coronary artery disease. Please note that although the presence of coronary artery calcium documents the presence of coronary artery disease, the severity of this disease and any potential stenosis cannot be assessed on this non-gated CT examination. Assessment for potential risk factor modification, dietary therapy or pharmacologic therapy may be warranted, if clinically indicated.   Electronically Signed   By: Lucrezia Europe M.D.   On: 02/13/2014 12:19   Dg Chest Port 1 View  02/13/2014   CLINICAL DATA:  SOB (ongoing but was worse the past couple of days, had trouble breathing last night), cough, congestion, lower left CP recently as wellHx HTN, CHF, pna, ex-smoker (quit 40 years ago)  EXAM: PORTABLE CHEST - 1 VIEW  COMPARISON:  PET-CT 07/14/2013  FINDINGS: Mild perihilar and bibasilar coarse interstitial opacities, without convincing change from previous study. Heart size upper limits normal. No effusion. Spurring in the mid and lower thoracic spine. Advanced degenerative changes in the right shoulder.  IMPRESSION: 1. Stable mild cardiomegaly and chronic interstitial changes. No acute disease.  Electronically Signed   By: Lucrezia Europe M.D.   On: 02/13/2014 09:21    ROS Blood pressure 106/58, pulse 62, temperature 98.5 F (36.9 C), temperature  source Oral, resp. rate 18, height $RemoveBe'5\' 3"'[id number]$  (1.6 m), weight 66.543 kg (146 lb 11.2 oz), SpO2 98 %. Physical Exam  Constitutional: She appears well-developed and well-nourished.  HENT:  She is awake and alert. She has no stridor. Oral cavity/oropharynx-no lesions. Nose is clear. Neck has a very large mass in the right inferior neck that is about 4-1/2 cm. It is mobile. It's fairly firm. The thyroid is also enlarged and definitely has a sub-sternal component. No neuropathy felt in the left neck    Assessment/Plan: Bilateral thyroid mass-this problem is a long-standing issue with previous treatment with radioactive iodine and significant calcifications within the gland. It is substernal. There is a large component on the right side that is concerning for possible malignant degeneration. We talked about this finding and that a fine-needle aspiration certainly would be a consideration, but right now she is not interested in any surgical intervention and so working this may not be appropriate if no matter what the answer to the test is she will not want any intervention. She is going to think about this further. No intervention is necessary currently and she should follow-up as an outpatient to continue the discussion about workup and fine-needle aspiration. We did briefly discuss a thyroidectomy. She understands a portion of what that procedure involves.  Melissa Montane 02/13/2014, 7:57 PM

## 2014-02-13 NOTE — ED Provider Notes (Signed)
CSN: [id number]     Arrival date & time 02/13/14  7062 History  This chart was scribed for Ezequiel Essex, MD by Ludger Nutting, ED Scribe. This patient was seen in room D36C/D36C and the patient's care was started 8:41 AM.    Chief Complaint  Patient presents with  . Shortness of Breath   The history is provided by the patient. No language interpreter was used.    HPI Comments: [name] is a 81 y.o. female with past medical history of CHF, COPD, HTN who presents to the Emergency Department complaining of 1 day of constant, gradually worsened SOB with associated cough productive of green sputum. She reports she has SOB at baseline but states it worsened over the last 1 day. She states she had upper/mid back pain last night but states it has since resolved. She denies using home oxygen and is normally able to lay flat without difficulty. She has tried using her inhaler without relief. She denies CP, fever, abdominal pain. She denies history of DVT/PE. She denies anticoagulant use.    Past Medical History  Diagnosis Date  . Hypertension   . COPD (chronic obstructive pulmonary disease)   . CHF (congestive heart failure)   . SOB (shortness of breath)     'all the time" (01/02/2012)  . Arthritis     "right shoulder" (01/02/2012)  . Pneumonia 01/02/2012; ? 2009  . GERD (gastroesophageal reflux disease)     occasional  . Anemia     years ago  . Diverticulitis of colon with perforation 01/14/2012    That is post sigmoid resection with [facility] pouch and end colostomy   . CAP (community acquired pneumonia) 01/02/2012    Lower lobe   . Intra-abdominal abscess 01/14/2012  . Pneumatosis of intestines 01/14/2012  . UTI (lower urinary tract infection) 01/03/2012  . Bladder mass   . History of kidney stones     X 1  . Difficulty sleeping   . Goiter     radioactive iodine ablation/notes 01/02/2012  . Hypothyroidism     "I have taken Synthroid before" (01/02/2012)   HAS HAD RADIOACTIVE IODINE TX 2 YR S AGO   . Radiation 08/10/13-09/15/13    57 fray to vaginal/bladder mass   Past Surgical History  Procedure Laterality Date  . Cataract extraction w/ intraocular lens  implant, bilateral  ?1990's    Bil  . Appendectomy  1980's    "when they did hysterectomy" (01/02/2012)  . Abdominal hysterectomy  1980's  . Colostomy revision N/A 03/12/2012    Procedure: COLON RESECTION SIGMOID ;  Surgeon: Ralene Ok, MD;  Location: [place];  Service: General;  Laterality: N/A;  . Colostomy N/A 03/12/2012    Procedure: COLOSTOMY;  Surgeon: Ralene Ok, MD;  Location: [place];  Service: General;  Laterality: N/A;  . Colostomy takedown N/A 10/21/2012    Procedure: LAPAROSCOPIC COLOSTOMY TAKEDOWN AND HARTMANS ANASTOMOSIS, rigid proctoscopy;  Surgeon: Ralene Ok, MD;  Location: [facility];  Service: General;  Laterality: N/A;  . Lysis of adhesion N/A 10/21/2012    Procedure: LYSIS OF ADHESION;  Surgeon: Ralene Ok, MD;  Location: [facility];  Service: General;  Laterality: N/A;  . Transurethral resection of bladder tumor with gyrus (turbt-gyrus) N/A 06/30/2013    Procedure: TRANSURETHRAL RESECTION OF BLADDER TUMOR WITH GYRUS (TURBT-GYRUS)/VAGINAL BIOPSY;  Surgeon: Ardis Hughs, MD;  Location: [facility];  Service: Urology;  Laterality: N/A;   Family History  Problem Relation Age of Onset  . Heart disease    .  Cancer Sister     Breast  . Heart disease Mother    History  Substance Use Topics  . Smoking status: Former Smoker -- 0.12 packs/day for 35 years    Types: Cigarettes    Quit date: 01/02/1991  . Smokeless tobacco: Never Used  . Alcohol Use: No   OB History    No data available     Review of Systems  A complete 10 system review of systems was obtained and all systems are negative except as noted in the HPI and PMH.    Allergies  Indomethacin  Home Medications   Prior to Admission medications   Medication Sig Start Date End Date Taking? Authorizing Provider  acetaminophen (TYLENOL) 500  MG tablet Take 1,000 mg by mouth every 6 (six) hours as needed (Pain).   Yes Historical Provider, MD  albuterol (PROVENTIL HFA;VENTOLIN HFA) 108 (90 BASE) MCG/ACT inhaler Inhale 2 puffs into the lungs every 6 (six) hours as needed for wheezing.   Yes Historical Provider, MD  aspirin 81 MG tablet Take 81 mg by mouth daily.   Yes Historical Provider, MD  furosemide (LASIX) 80 MG tablet Take 40 mg by mouth daily.   Yes Historical Provider, MD  ibuprofen (ADVIL,MOTRIN) 200 MG tablet Take 600 mg by mouth every 6 (six) hours as needed.   Yes Historical Provider, MD  lisinopril (PRINIVIL,ZESTRIL) 20 MG tablet Take 10 mg by mouth every morning.  03/31/12  Yes Barton Dubois, MD  polyethylene glycol (MIRALAX / GLYCOLAX) packet Take 17 g by mouth as needed.   Yes Historical Provider, MD  polyvinyl alcohol (LIQUIFILM TEARS) 1.4 % ophthalmic solution Place 1 drop into both eyes as needed for dry eyes.   Yes Historical Provider, MD  temazepam (RESTORIL) 15 MG capsule  10/12/13   Historical Provider, MD  Vitamin D, Ergocalciferol, (DRISDOL) 50000 UNITS CAPS Take 50,000 Units by mouth every 14 (fourteen) days. Sunday    Historical Provider, MD   BP 106/58 mmHg  Pulse 62  Temp(Src) 98.5 F (36.9 C) (Oral)  Resp 18  Ht 5\' 3"  (1.6 m)  Wt 146 lb 11.2 oz (66.543 kg)  BMI 25.99 kg/m2  SpO2 98% Physical Exam  Constitutional: She is oriented to person, place, and time. She appears well-developed and well-nourished. No distress.  HENT:  Head: Normocephalic and atraumatic.  Mouth/Throat: Oropharynx is clear and moist. No oropharyngeal exudate.  Eyes: Conjunctivae and EOM are normal. Pupils are equal, round, and reactive to light.  Neck: Normal range of motion. Neck supple. Thyromegaly present.  No meningismus. Large R sided nontender goiter.  Cardiovascular: Normal rate, regular rhythm, normal heart sounds and intact distal pulses.   No murmur heard. Pulmonary/Chest: Effort normal and breath sounds normal. No  respiratory distress. She has no wheezes.  Dyspneic with conversation. Increased work of breathing. Lungs are clear to ausculation but diminished at the bases.   Abdominal: Soft. There is no tenderness. There is no rebound and no guarding.  Musculoskeletal: Normal range of motion. She exhibits no edema or tenderness.  No peripheral edema.   Neurological: She is alert and oriented to person, place, and time. No cranial nerve deficit. She exhibits normal muscle tone. Coordination normal.  No ataxia on finger to nose bilaterally. No pronator drift. 5/5 strength throughout. CN 2-12 intact. Negative Romberg. Equal grip strength. Sensation intact. Gait is normal.   Skin: Skin is warm.  Psychiatric: She has a normal mood and affect. Her behavior is normal.  Nursing note and  vitals reviewed.   ED Course  Procedures (including critical care time)  DIAGNOSTIC STUDIES: Oxygen Saturation is 92% on RA, low by my interpretation.    COORDINATION OF CARE: 8:46 AM Will order CXR and lab work; will give breathing treatment. Discussed treatment plan with pt at bedside and pt agreed to plan.  11:20 AM Updated patient on results and need for chest CT. Patient is concerned about right neck goiter so will order CT soft tissue neck.    Labs Review Labs Reviewed  CBC - Abnormal; Notable for the following:    WBC 15.2 (*)    All other components within normal limits  BASIC METABOLIC PANEL - Abnormal; Notable for the following:    Glucose, Bld 131 (*)    GFR calc non Af Amer 62 (*)    GFR calc Af Amer 72 (*)    All other components within normal limits  BRAIN NATRIURETIC PEPTIDE - Abnormal; Notable for the following:    B Natriuretic Peptide 938.8 (*)    All other components within normal limits  TROPONIN I - Abnormal; Notable for the following:    Troponin I 0.07 (*)    All other components within normal limits  PROTIME-INR - Abnormal; Notable for the following:    Prothrombin Time 15.5 (*)    All  other components within normal limits  URINALYSIS, ROUTINE W REFLEX MICROSCOPIC - Abnormal; Notable for the following:    Color, Urine AMBER (*)    Hgb urine dipstick TRACE (*)    Bilirubin Urine SMALL (*)    Ketones, ur 40 (*)    Protein, ur 100 (*)    Leukocytes, UA SMALL (*)    All other components within normal limits  URINE MICROSCOPIC-ADD ON - Abnormal; Notable for the following:    Casts WBC CAST (*)    All other components within normal limits  CULTURE, BLOOD (ROUTINE X 2)  CULTURE, BLOOD (ROUTINE X 2)  CULTURE, EXPECTORATED SPUTUM-ASSESSMENT  GRAM STAIN  HIV ANTIBODY (ROUTINE TESTING)  LEGIONELLA ANTIGEN, URINE  STREP PNEUMONIAE URINARY ANTIGEN  CBC  CREATININE, SERUM  TSH  I-STAT TROPOININ, ED    Imaging Review Ct Soft Tissue Neck W Contrast  02/13/2014   CLINICAL DATA:  Shortness of breath.  EXAM: CT NECK WITH CONTRAST  TECHNIQUE: Multidetector CT imaging of the neck was performed using the standard protocol following the bolus administration of intravenous contrast.  CONTRAST:  112mL OMNIPAQUE IOHEXOL 350 MG/ML SOLN  COMPARISON:  PET scan 07/14/2013.  FINDINGS: Pharynx and larynx: LEFT-to-RIGHT displacement air column larynx by large goiter described below. In the or pharyngeal region there is mass effect on the hypopharynx from the RIGHT, also described below. Potentially symptomatic narrowing of the airway in the supraglottic region. No mucosal lesion is evident.  Salivary glands: Unremarkable.  Thyroid: Massive goiter. Heterogeneous appearance with multiple calcifications. Inferiorly the gland is larger on the LEFT, with early BILATERAL substernal extension. As noted from PET scan, there is a hypermetabolic component which extends superiorly from the RIGHT lobe having cross-section 54 x 39 mm. This lesion has not dramatically progressed since July of 2015 but remains concerning for malignant degeneration of goiter. ENT consultation with tissue sampling is warranted.  Lymph  nodes: No pathologic adenopathy.  Vascular: Lateral displacement of the carotid sheath on the RIGHT. Minor atheromatous changes bilaterally.  Limited intracranial: Negative.  Visualized orbits: BILATERAL cataract extraction. No acute features.  Mastoids and visualized paranasal sinuses: Moderate sphenoid sinus disease with layering fluid  suggesting acuity. No other significant sinus opacity. No mastoid fluid.  Skeleton: For a minor degenerative disc disease. No osseous lesions.  Upper chest: Reported separately.  IMPRESSION: Massive goiter, with the dominant hypermetabolic (on prior PET) component on the RIGHT potentially compromising the supraglottic airway having cross-sectional measurements of 54 x 39 mm. Concern raised for malignant degeneration of goiter with thyroid carcinoma. ENT consultation with tissue sampling is warranted.   Electronically Signed   By: Rolla Flatten M.D.   On: 02/13/2014 12:31   Ct Angio Chest Pe W/cm &/or Wo Cm  02/13/2014   CLINICAL DATA:  Dyspnea Worsening SOB since yesterday Productive cough Chest pain Hx:Hx Squamous cell ca HTN COPD CHF Hysterectomy  EXAM: CT ANGIOGRAPHY CHEST WITH CONTRAST  TECHNIQUE: Multidetector CT imaging of the chest was performed using the standard protocol during bolus administration of intravenous contrast. Multiplanar CT image reconstructions and MIPs were obtained to evaluate the vascular anatomy.  CONTRAST:  181mL OMNIPAQUE IOHEXOL 350 MG/ML SOLN  COMPARISON:  07/14/2013 AND EARLIER STUDIES  FINDINGS: The svc is patent. Mild four-chamber cardiac enlargement. Mild reflux from right atrium into central hepatic veins. rv/lv ratio less than 1, normal. dilated central pulmonary arteries. satisfactory opacification of pulmonary arteries noted, and there is no evidence of pulmonary emboli. patient breathing during the acquisition degrades some of the images. patent superior and inferior pulmonary veins bilaterally. moderate coronary calcifications. Adequate  contrast opacification of the thoracic aorta with no evidence of dissection, aneurysm, or stenosis. There is bovine variant brachiocephalic arch anatomy without proximal stenosis.  No pleural or pericardial effusion. Subcentimeter prevascular, AP window, and pretracheal lymph nodes, stable. Patchy airspace opacities in the inferior lingula and anterior left upper lobe, right suprahilar region in the right upper lobe, and in anterior and lateral basal segments right lower lobe, new since prior scan.  Review of the MIP images confirms the above findings. Massively enlarged thyroid measure in at least 11.6 cm transverse diameter with multiple nodules and at the coarse calcifications, incompletely visualized, resulting in mild rightward tracheal deviation without narrowing. Bridging osteophytes across multiple levels in the mid and lower thoracic spine. Advanced degenerative disc disease at multiple contiguous levels in the thoracolumbar junction. Sternum is a intact. Complex 19 mm right renal cyst, incompletely visualized. Bilateral adrenal hyperplasia. Remainder visualized upper abdomen unremarkable.  IMPRESSION: 1. Negative for acute PE or thoracic aortic dissection. 2. Patchy airspace opacities in the lingula, anterior left upper lobe, right upper and lower lobes, new since prior study, suggesting multifocal pneumonia. Consider follow-up to confirm appropriate resolution. 3. Thyromegaly with multiple nodules, incompletely visualized. 4. Atherosclerosis, including aortic and coronary artery disease. Please note that although the presence of coronary artery calcium documents the presence of coronary artery disease, the severity of this disease and any potential stenosis cannot be assessed on this non-gated CT examination. Assessment for potential risk factor modification, dietary therapy or pharmacologic therapy may be warranted, if clinically indicated.   Electronically Signed   By: Lucrezia Europe M.D.   On: 02/13/2014  12:19   Dg Chest Port 1 View  02/13/2014   CLINICAL DATA:  SOB (ongoing but was worse the past couple of days, had trouble breathing last night), cough, congestion, lower left CP recently as wellHx HTN, CHF, pna, ex-smoker (quit 40 years ago)  EXAM: PORTABLE CHEST - 1 VIEW  COMPARISON:  PET-CT 07/14/2013  FINDINGS: Mild perihilar and bibasilar coarse interstitial opacities, without convincing change from previous study. Heart size upper limits normal. No effusion.  Spurring in the mid and lower thoracic spine. Advanced degenerative changes in the right shoulder.  IMPRESSION: 1. Stable mild cardiomegaly and chronic interstitial changes. No acute disease.   Electronically Signed   By: Lucrezia Europe M.D.   On: 02/13/2014 09:21     EKG Interpretation   Date/Time:  Saturday February 13 2014 08:39:06 EST Ventricular Rate:  83 PR Interval:  182 QRS Duration: 129 QT Interval:  361 QTC Calculation: 424 R Axis:   -11 Text Interpretation:  Sinus rhythm Multiform ventricular premature  complexes Left bundle branch block No significant change was found  Confirmed by Wyvonnia Dusky  MD, Lorenza Winkleman [phone]) on 02/13/2014 8:47:10 AM      MDM   Final diagnoses:  Dyspnea  CAP (community acquired pneumonia)  Goiter    Patient presents with worsening shortness of breath since last night. History of COPD does not wear oxygen at home. Endorses cough productive of yellow mucus. No chest pain.  Dyspneic with conversation but lungs are relatively clear. Chest x-ray negative for infiltrate  Patient with history of unknown type of squamous cell cancer. PE is considered.  No PE but does show multifocal pneumonia that is bilateral. Patient is febrile with leukocytosis. We'll treat with antibiotics. She has recently received radiation for a pelvic mass but no chemotherapy.  CT also shows enlarged goiter and thyroid causing some compression of supraglottic airway. Patient and his is no distress and speaking in full  sentences.  D/w Dr. Janace Hoard who will evaluate goiter while inpatient.  Admission for CAP d/w Dr. [place] Pink.   I personally performed the services described in this documentation, which was scribed in my presence. The recorded information has been reviewed and is accurate.   Ezequiel Essex, MD 02/13/14 [phone]

## 2014-02-13 NOTE — H&P (Addendum)
History and Physical  [name] IRJ:[id number] DOB: June 20, 1925 DOA: 02/13/2014  Referring physician: Leota Jacobsen physician PCP: Leonard Downing, MD   Chief Complaint: Shortness of breath and cough  HPI: [name] is a 81 y.o. female  With past medical history of COPD, systolic heart failure and previous goiter who has been in good state of health when she started developing progressively worsening dyspnea and productive cough with greenish sputum starting 1 day prior to admission. With symptoms progressing, patient decided to come into the emergency room for further evaluation. Mr. chest x-ray was unrevealing. White count was noted to be 15 and BNP elevated at almost 1000. Patient noted to be mildly hypoxic with ambulation on room air at 88%. With 2 L up to 94%. CT scan of the chest done noted multi lobar pneumonia. Patient given IV antibiotics.  In addition, patient was noted to have a prominent right sided goiter. ER ordered CT scan of the neck and noted some tracheal deviation. This was discussed with ENT, Dr. Janace Hoard, who stated that they will come and evaluate the patient.  Hospitalist call for further evaluation and admission   Review of Systems:  Patient seen in the emergency room. Pt complains of cough-productive, mild shortness breath although better with oxygen.  Pt denies any headaches, vision changes, dysphagia, chest pain, palpitations, wheezing, abdominal pain, hematuria, dysuria, constipation, diarrhea, focal extremity numbness weakness or pain.  Review of systems are otherwise negative  Past Medical History  Diagnosis Date  . Hypertension   . COPD (chronic obstructive pulmonary disease)   . CHF (congestive heart failure)   . SOB (shortness of breath)     'all the time" (01/02/2012)  . Arthritis     "right shoulder" (01/02/2012)  . Pneumonia 01/02/2012; ? 2009  . GERD (gastroesophageal reflux disease)     occasional  . Anemia     years ago  . Diverticulitis of  colon with perforation 01/14/2012    That is post sigmoid resection with [facility] pouch and end colostomy   . CAP (community acquired pneumonia) 01/02/2012    Lower lobe   . Intra-abdominal abscess 01/14/2012  . Pneumatosis of intestines 01/14/2012  . UTI (lower urinary tract infection) 01/03/2012  . Bladder mass   . History of kidney stones     X 1  . Difficulty sleeping   . Goiter     radioactive iodine ablation/notes 01/02/2012  . Hypothyroidism     "I have taken Synthroid before" (01/02/2012)   HAS HAD RADIOACTIVE IODINE TX 2 YR S AGO  . Radiation 08/10/13-09/15/13    57 fray to vaginal/bladder mass   Past Surgical History  Procedure Laterality Date  . Cataract extraction w/ intraocular lens  implant, bilateral  ?1990's    Bil  . Appendectomy  1980's    "when they did hysterectomy" (01/02/2012)  . Abdominal hysterectomy  1980's  . Colostomy revision N/A 03/12/2012    Procedure: COLON RESECTION SIGMOID ;  Surgeon: Ralene Ok, MD;  Location: [place];  Service: General;  Laterality: N/A;  . Colostomy N/A 03/12/2012    Procedure: COLOSTOMY;  Surgeon: Ralene Ok, MD;  Location: [place];  Service: General;  Laterality: N/A;  . Colostomy takedown N/A 10/21/2012    Procedure: LAPAROSCOPIC COLOSTOMY TAKEDOWN AND HARTMANS ANASTOMOSIS, rigid proctoscopy;  Surgeon: Ralene Ok, MD;  Location: [facility];  Service: General;  Laterality: N/A;  . Lysis of adhesion N/A 10/21/2012    Procedure: LYSIS OF ADHESION;  Surgeon: Ralene Ok,  MD;  Location: [facility];  Service: General;  Laterality: N/A;  . Transurethral resection of bladder tumor with gyrus (turbt-gyrus) N/A 06/30/2013    Procedure: TRANSURETHRAL RESECTION OF BLADDER TUMOR WITH GYRUS (TURBT-GYRUS)/VAGINAL BIOPSY;  Surgeon: Ardis Hughs, MD;  Location: [facility];  Service: Urology;  Laterality: N/A;   Social History:  reports that she quit smoking about 23 years ago. Her smoking use included Cigarettes. She has a 4.2 pack-year smoking  history. She has never used smokeless tobacco. She reports that she does not drink alcohol or use illicit drugs. Patient lives at home by herself & is able to participate in activities of daily living with out assistance. She rarely uses a walker  Allergies  Allergen Reactions  . Indomethacin Anaphylaxis and Other (See Comments)    "took it fine for awhile; one day I stopped breathing and I ended up in the hospital" (01/02/2012)    Family History  Problem Relation Age of Onset  . Heart disease    . Cancer Sister     Breast  . Heart disease Mother     As reviewed with family  Prior to Admission medications   Medication Sig Start Date End Date Taking? Authorizing Provider  acetaminophen (TYLENOL) 500 MG tablet Take 1,000 mg by mouth every 6 (six) hours as needed (Pain).   Yes Historical Provider, MD  albuterol (PROVENTIL HFA;VENTOLIN HFA) 108 (90 BASE) MCG/ACT inhaler Inhale 2 puffs into the lungs every 6 (six) hours as needed for wheezing.   Yes Historical Provider, MD  aspirin 81 MG tablet Take 81 mg by mouth daily.   Yes Historical Provider, MD  furosemide (LASIX) 80 MG tablet Take 40 mg by mouth daily.   Yes Historical Provider, MD  ibuprofen (ADVIL,MOTRIN) 200 MG tablet Take 600 mg by mouth every 6 (six) hours as needed.   Yes Historical Provider, MD  lisinopril (PRINIVIL,ZESTRIL) 20 MG tablet Take 10 mg by mouth every morning.  03/31/12  Yes Barton Dubois, MD  polyethylene glycol (MIRALAX / GLYCOLAX) packet Take 17 g by mouth as needed.   Yes Historical Provider, MD  polyvinyl alcohol (LIQUIFILM TEARS) 1.4 % ophthalmic solution Place 1 drop into both eyes as needed for dry eyes.   Yes Historical Provider, MD  temazepam (RESTORIL) 15 MG capsule  10/12/13   Historical Provider, MD  Vitamin D, Ergocalciferol, (DRISDOL) 50000 UNITS CAPS Take 50,000 Units by mouth every 14 (fourteen) days. Sunday    Historical Provider, MD    Physical Exam: BP 106/58 mmHg  Pulse 62  Temp(Src) 98.5 F  (36.9 C) (Oral)  Resp 18  Ht 5\' 3"  (1.6 m)  Wt 66.543 kg (146 lb 11.2 oz)  BMI 25.99 kg/m2  SpO2 98%  General:  Alert and oriented 3, no acute distress, pleasant Eyes: Sclera nonicteric, extraocular movements are intact ENT: Normocephalic, atraumatic, mucous migraines are slightly dry Neck: Noted right sided goiter. Nontender. Cardiovascular: Regular rate and rhythm, Q6-S3, 2/6 systolic ejection murmur Respiratory: Clear to auscultation bilaterally Abdomen: Soft, nontender, nondistended, positive bowel sounds Skin: No skin breaks, tears or lesions  Musculoskeletal: No clubbing cyanosis or edema Psychiatric: Patient is appropriate, no evidence of psychoses Neurologic: No focal deficits           Labs on Admission:  Basic Metabolic Panel:  Recent Labs Lab 02/13/14 0838  NA 137  K 3.8  CL 100  CO2 27  GLUCOSE 131*  BUN 13  CREATININE 0.83  CALCIUM 8.7   Liver Function Tests:  No results for input(s): AST, ALT, ALKPHOS, BILITOT, PROT, ALBUMIN in the last 168 hours. No results for input(s): LIPASE, AMYLASE in the last 168 hours. No results for input(s): AMMONIA in the last 168 hours. CBC:  Recent Labs Lab 02/13/14 0838  WBC 15.2*  HGB 12.9  HCT 37.1  MCV 89.6  PLT 236   Cardiac Enzymes:  Recent Labs Lab 02/13/14 0838  TROPONINI 0.07*    BNP (last 3 results)  Recent Labs  02/13/14 0838  BNP 938.8*    ProBNP (last 3 results) No results for input(s): PROBNP in the last 8760 hours.  CBG: No results for input(s): GLUCAP in the last 168 hours.  Radiological Exams on Admission: Ct Soft Tissue Neck W Contrast  02/13/2014   CLINICAL DATA:  Shortness of breath.  EXAM: CT NECK WITH CONTRAST  TECHNIQUE: Multidetector CT imaging of the neck was performed using the standard protocol following the bolus administration of intravenous contrast.  CONTRAST:  122mL OMNIPAQUE IOHEXOL 350 MG/ML SOLN  COMPARISON:  PET scan 07/14/2013.  FINDINGS: Pharynx and larynx:  LEFT-to-RIGHT displacement air column larynx by large goiter described below. In the or pharyngeal region there is mass effect on the hypopharynx from the RIGHT, also described below. Potentially symptomatic narrowing of the airway in the supraglottic region. No mucosal lesion is evident.  Salivary glands: Unremarkable.  Thyroid: Massive goiter. Heterogeneous appearance with multiple calcifications. Inferiorly the gland is larger on the LEFT, with early BILATERAL substernal extension. As noted from PET scan, there is a hypermetabolic component which extends superiorly from the RIGHT lobe having cross-section 54 x 39 mm. This lesion has not dramatically progressed since July of 2015 but remains concerning for malignant degeneration of goiter. ENT consultation with tissue sampling is warranted.  Lymph nodes: No pathologic adenopathy.  Vascular: Lateral displacement of the carotid sheath on the RIGHT. Minor atheromatous changes bilaterally.  Limited intracranial: Negative.  Visualized orbits: BILATERAL cataract extraction. No acute features.  Mastoids and visualized paranasal sinuses: Moderate sphenoid sinus disease with layering fluid suggesting acuity. No other significant sinus opacity. No mastoid fluid.  Skeleton: For a minor degenerative disc disease. No osseous lesions.  Upper chest: Reported separately.  IMPRESSION: Massive goiter, with the dominant hypermetabolic (on prior PET) component on the RIGHT potentially compromising the supraglottic airway having cross-sectional measurements of 54 x 39 mm. Concern raised for malignant degeneration of goiter with thyroid carcinoma. ENT consultation with tissue sampling is warranted.   Electronically Signed   By: Rolla Flatten M.D.   On: 02/13/2014 12:31   Ct Angio Chest Pe W/cm &/or Wo Cm  02/13/2014   CLINICAL DATA:  Dyspnea Worsening SOB since yesterday Productive cough Chest pain Hx:Hx Squamous cell ca HTN COPD CHF Hysterectomy  EXAM: CT ANGIOGRAPHY CHEST WITH  CONTRAST  TECHNIQUE: Multidetector CT imaging of the chest was performed using the standard protocol during bolus administration of intravenous contrast. Multiplanar CT image reconstructions and MIPs were obtained to evaluate the vascular anatomy.  CONTRAST:  110mL OMNIPAQUE IOHEXOL 350 MG/ML SOLN  COMPARISON:  07/14/2013 AND EARLIER STUDIES  FINDINGS: The svc is patent. Mild four-chamber cardiac enlargement. Mild reflux from right atrium into central hepatic veins. rv/lv ratio less than 1, normal. dilated central pulmonary arteries. satisfactory opacification of pulmonary arteries noted, and there is no evidence of pulmonary emboli. patient breathing during the acquisition degrades some of the images. patent superior and inferior pulmonary veins bilaterally. moderate coronary calcifications. Adequate contrast opacification of the thoracic aorta with no evidence  of dissection, aneurysm, or stenosis. There is bovine variant brachiocephalic arch anatomy without proximal stenosis.  No pleural or pericardial effusion. Subcentimeter prevascular, AP window, and pretracheal lymph nodes, stable. Patchy airspace opacities in the inferior lingula and anterior left upper lobe, right suprahilar region in the right upper lobe, and in anterior and lateral basal segments right lower lobe, new since prior scan.  Review of the MIP images confirms the above findings. Massively enlarged thyroid measure in at least 11.6 cm transverse diameter with multiple nodules and at the coarse calcifications, incompletely visualized, resulting in mild rightward tracheal deviation without narrowing. Bridging osteophytes across multiple levels in the mid and lower thoracic spine. Advanced degenerative disc disease at multiple contiguous levels in the thoracolumbar junction. Sternum is a intact. Complex 19 mm right renal cyst, incompletely visualized. Bilateral adrenal hyperplasia. Remainder visualized upper abdomen unremarkable.  IMPRESSION: 1.  Negative for acute PE or thoracic aortic dissection. 2. Patchy airspace opacities in the lingula, anterior left upper lobe, right upper and lower lobes, new since prior study, suggesting multifocal pneumonia. Consider follow-up to confirm appropriate resolution. 3. Thyromegaly with multiple nodules, incompletely visualized. 4. Atherosclerosis, including aortic and coronary artery disease. Please note that although the presence of coronary artery calcium documents the presence of coronary artery disease, the severity of this disease and any potential stenosis cannot be assessed on this non-gated CT examination. Assessment for potential risk factor modification, dietary therapy or pharmacologic therapy may be warranted, if clinically indicated.   Electronically Signed   By: Lucrezia Europe M.D.   On: 02/13/2014 12:19   Dg Chest Port 1 View  02/13/2014   CLINICAL DATA:  SOB (ongoing but was worse the past couple of days, had trouble breathing last night), cough, congestion, lower left CP recently as wellHx HTN, CHF, pna, ex-smoker (quit 40 years ago)  EXAM: PORTABLE CHEST - 1 VIEW  COMPARISON:  PET-CT 07/14/2013  FINDINGS: Mild perihilar and bibasilar coarse interstitial opacities, without convincing change from previous study. Heart size upper limits normal. No effusion. Spurring in the mid and lower thoracic spine. Advanced degenerative changes in the right shoulder.  IMPRESSION: 1. Stable mild cardiomegaly and chronic interstitial changes. No acute disease.   Electronically Signed   By: Lucrezia Europe M.D.   On: 02/13/2014 09:21    EKG: Independently reviewed. : Normal sinus rhythm with PVCs, left bundle branch block  Assessment/Plan Present on Admission:  . sepsis secondary to CAP (community acquired pneumonia): Patient needs criteria with temp at 100.8 and leukocytosis on admission with pneumonia of long as suspected source. Continue IV antibiotics plus nebulizers plus supplemental oxygen. Given multi lobar, will  check swallow eval to confirm that she is not having issues with swallowing due to goiter.  Marland Kitchen Overweight (BMI 25.0-29.9): Patient meets criteria with BMI greater than 25  . Goiter: Reportedly, decades ago, patient states that she took iodine which shrunk her goiter. Since that time, daughter has felt like in the last month it has started to grow again. Start by checking TSH. ENT to evaluate given tracheal deviation.   . Acute on chronic systolic heart failure: : Repeat echocardiogram as one not done in 2 years. BNP significantly elevated, in part from pneumonia, but also in part from some heart failure. Lasix. ACE inhibitor. Heart rate in 60s, so hold off on beta blocker for now.  Essential hypertension: Continue home meds. See above.  Consultants: Ear nose and throat  Code Status: Full code  Family Communication: Son and daughter  at the bedside   Disposition Plan: Home once fully diuresed, breathing comfortably on room air and plan in place for goiter  Time spent: 45 minutes  [facility] Pager [phone]

## 2014-02-13 NOTE — ED Notes (Signed)
Worsening sob; always sob. Their is inc. In thyroid. Took an inhaler but ineffective, lasix for chf.

## 2014-02-13 NOTE — ED Notes (Signed)
Attempted report 

## 2014-02-13 NOTE — ED Notes (Signed)
Ambulated patient with pulse oximetry SPO2 dropped to 88%.

## 2014-02-14 DIAGNOSIS — J189 Pneumonia, unspecified organism: Secondary | ICD-10-CM

## 2014-02-14 DIAGNOSIS — Z8541 Personal history of malignant neoplasm of cervix uteri: Secondary | ICD-10-CM

## 2014-02-14 DIAGNOSIS — R778 Other specified abnormalities of plasma proteins: Secondary | ICD-10-CM | POA: Diagnosis present

## 2014-02-14 DIAGNOSIS — A419 Sepsis, unspecified organism: Principal | ICD-10-CM

## 2014-02-14 DIAGNOSIS — Z7982 Long term (current) use of aspirin: Secondary | ICD-10-CM

## 2014-02-14 DIAGNOSIS — R0602 Shortness of breath: Secondary | ICD-10-CM | POA: Diagnosis present

## 2014-02-14 DIAGNOSIS — E049 Nontoxic goiter, unspecified: Secondary | ICD-10-CM

## 2014-02-14 DIAGNOSIS — I493 Ventricular premature depolarization: Secondary | ICD-10-CM

## 2014-02-14 DIAGNOSIS — R7989 Other specified abnormal findings of blood chemistry: Secondary | ICD-10-CM

## 2014-02-14 DIAGNOSIS — I5023 Acute on chronic systolic (congestive) heart failure: Secondary | ICD-10-CM

## 2014-02-14 LAB — BASIC METABOLIC PANEL
Anion gap: 9 (ref 5–15)
BUN: 15 mg/dL (ref 6–23)
CO2: 26 mmol/L (ref 19–32)
Calcium: 8.6 mg/dL (ref 8.4–10.5)
Chloride: 102 mmol/L (ref 96–112)
Creatinine, Ser: 0.98 mg/dL (ref 0.50–1.10)
GFR calc Af Amer: 59 mL/min — ABNORMAL LOW (ref >90)
GFR calc non Af Amer: 51 mL/min — ABNORMAL LOW (ref >90)
Glucose, Bld: 109 mg/dL — ABNORMAL HIGH (ref 70–99)
Potassium: 3.6 mmol/L (ref 3.5–5.1)
SODIUM: 137 mmol/L (ref 135–145)

## 2014-02-14 LAB — TROPONIN I
TROPONIN I: 0.12 ng/mL — AB (ref <0.031)
Troponin I: 0.08 ng/mL — ABNORMAL HIGH (ref <0.031)

## 2014-02-14 LAB — CBC
HCT: 32.6 % — ABNORMAL LOW (ref 36.0–46.0)
HEMOGLOBIN: 10.7 g/dL — AB (ref 12.0–15.0)
MCH: 30.3 pg (ref 26.0–34.0)
MCHC: 32.8 g/dL (ref 30.0–36.0)
MCV: 92.4 fL (ref 78.0–100.0)
Platelets: 211 10*3/uL (ref 150–400)
RBC: 3.53 MIL/uL — ABNORMAL LOW (ref 3.87–5.11)
RDW: 12.7 % (ref 11.5–15.5)
WBC: 14.1 10*3/uL — ABNORMAL HIGH (ref 4.0–10.5)

## 2014-02-14 MED ORDER — ALBUTEROL SULFATE (2.5 MG/3ML) 0.083% IN NEBU
2.5000 mg | INHALATION_SOLUTION | Freq: Four times a day (QID) | RESPIRATORY_TRACT | Status: DC
  Administered 2014-02-14 – 2014-02-16 (×9): 2.5 mg via RESPIRATORY_TRACT
  Filled 2014-02-14 (×9): qty 3

## 2014-02-14 MED ORDER — ALBUTEROL SULFATE (2.5 MG/3ML) 0.083% IN NEBU: 2.5000 mg | INHALATION_SOLUTION | Freq: Four times a day (QID) | RESPIRATORY_TRACT | Status: DC | PRN

## 2014-02-14 MED ORDER — SIMVASTATIN 10 MG PO TABS
10.0000 mg | ORAL_TABLET | Freq: Every day | ORAL | Status: DC
  Administered 2014-02-14 – 2014-02-16 (×3): 10 mg via ORAL
  Filled 2014-02-14 (×4): qty 1

## 2014-02-14 NOTE — Progress Notes (Addendum)
Alerts received from [facility] with pair PVCs. MD notified. New order -EKG

## 2014-02-14 NOTE — Evaluation (Signed)
Clinical/Bedside Swallow Evaluation Patient Details  Name: [name] MRN: [id number] Date of Birth: 06/21/1925  Today's Date: 02/14/2014 Time: SLP Start Time (ACUTE ONLY): 36 SLP Stop Time (ACUTE ONLY): 1127 SLP Time Calculation (min) (ACUTE ONLY): 24 min  Past Medical History:  Past Medical History  Diagnosis Date  . Hypertension   . COPD (chronic obstructive pulmonary disease)   . CHF (congestive heart failure)   . SOB (shortness of breath)     'all the time" (01/02/2012)  . Arthritis     "right shoulder" (01/02/2012)  . Pneumonia 01/02/2012; ? 2009  . GERD (gastroesophageal reflux disease)     occasional  . Anemia     years ago  . Diverticulitis of colon with perforation 01/14/2012    That is post sigmoid resection with [facility] pouch and end colostomy   . CAP (community acquired pneumonia) 01/02/2012    Lower lobe   . Intra-abdominal abscess 01/14/2012  . Pneumatosis of intestines 01/14/2012  . UTI (lower urinary tract infection) 01/03/2012  . Bladder mass   . History of kidney stones     X 1  . Difficulty sleeping   . Goiter     radioactive iodine ablation/notes 01/02/2012  . Hypothyroidism     "I have taken Synthroid before" (01/02/2012)   HAS HAD RADIOACTIVE IODINE TX 2 YR S AGO  . Radiation 08/10/13-09/15/13    57 fray to vaginal/bladder mass   Past Surgical History:  Past Surgical History  Procedure Laterality Date  . Cataract extraction w/ intraocular lens  implant, bilateral  ?1990's    Bil  . Appendectomy  1980's    "when they did hysterectomy" (01/02/2012)  . Abdominal hysterectomy  1980's  . Colostomy revision N/A 03/12/2012    Procedure: COLON RESECTION SIGMOID ;  Surgeon: Ralene Ok, MD;  Location: [place];  Service: General;  Laterality: N/A;  . Colostomy N/A 03/12/2012    Procedure: COLOSTOMY;  Surgeon: Ralene Ok, MD;  Location: [place];  Service: General;  Laterality: N/A;  . Colostomy takedown N/A 10/21/2012    Procedure: LAPAROSCOPIC COLOSTOMY TAKEDOWN  AND HARTMANS ANASTOMOSIS, rigid proctoscopy;  Surgeon: Ralene Ok, MD;  Location[facility];  Service: General;  Laterality: N/A;  . Lysis of adhesion N/A 10/21/2012    Procedure: LYSIS OF ADHESION;  Surgeon: Ralene Ok, MD;  Location: [facility];  Service: General;  Laterality: N/A;  . Transurethral resection of bladder tumor with gyrus (turbt-gyrus) N/A 06/30/2013    Procedure: TRANSURETHRAL RESECTION OF BLADDER TUMOR WITH GYRUS (TURBT-GYRUS)/VAGINAL BIOPSY;  Surgeon: Ardis Hughs, MD;  Location: [facility];  Service: Urology;  Laterality: N/A;   HPI:  81 y.o. female with past medical history of COPD, systolic heart failure and previous goiter who has been in good state of health when she started developing progressively worsening dyspnea and productive cough with greenish sputum starting 1 day prior to admission. With symptoms progressing, patient decided to come into the emergency room for further evaluation. CT scan of chest showed multilobar PNA. In addition, patient noted to have prominent right sided goiter with some tracheal deviation. ENT consulted. Patient considering options (intervention vs no intervention). Patient also with h/o a respiratory based dysphagia during 03/2012 admission. Initially on nectar thick liquids based on bedside evaluation however advanced to thin liquids per MBS prior to d/c. Pt reports no difficulty swallowing except for with large pills, likely coinciding with h/o GERD.    Assessment / Plan / Recommendation Clinical Impression  Based on bedside  examination, patient presents with what appears to be a functional oropharyngeal swallow without overt s/s of aspiration. Ocassional throat clearing noted prior to po intake with clearance of suspected chest congestion. Patient however has mutliple risk factors for aspiration including GERD, h/o dysphagia, COPD, and goiter with possible displacement of trachea. Cannot r/o impact of goiter on pharyngeal function at this time  as well. Discussed with patient who does not wish to proceed with MBS at this time for instrumental assessment. Educated patient and son on risk factors and recommendations. Patient agreeable to continuation of current diet with use of compensatory strategies to decrease aspiration risk, likely chronic in nature to some degree given COPD. SLP will f/u briefly to reinforce use of strategies.     Aspiration Risk  Mild    Diet Recommendation Regular;Thin liquid   Liquid Administration via: Cup;No straw Medication Administration: Whole meds with liquid Supervision: Patient able to self feed;Intermittent supervision to cue for compensatory strategies Compensations: Slow rate;Small sips/bites Postural Changes and/or Swallow Maneuvers: Seated upright 90 degrees    Other  Recommendations Oral Care Recommendations: Oral care BID   Follow Up Recommendations  None    Frequency and Duration min 1 x/week  1 week   Pertinent Vitals/Pain n/a     Swallow Study    General HPI: 81 y.o. female with past medical history of COPD, systolic heart failure and previous goiter who has been in good state of health when she started developing progressively worsening dyspnea and productive cough with greenish sputum starting 1 day prior to admission. With symptoms progressing, patient decided to come into the emergency room for further evaluation. CT scan of chest showed multilobar PNA. In addition, patient noted to have prominent right sided goiter with some tracheal deviation. ENT consulted. Patient considering options (intervention vs no intervention). Patient also with h/o a respiratory based dysphagia during 03/2012 admission. Initially on nectar thick liquids based on bedside evaluation however advanced to thin liquids per MBS prior to d/c. Pt reports no difficulty swallowing except for with large pills, likely coinciding with h/o GERD.  Type of Study: Bedside swallow evaluation Previous Swallow Assessment: see  HPI Diet Prior to this Study: Regular;Thin liquids Temperature Spikes Noted: No Respiratory Status: Room air History of Recent Intubation: No Behavior/Cognition: Alert;Cooperative;Pleasant mood Oral Cavity - Dentition: Dentures, top;Dentures, bottom Self-Feeding Abilities: Able to feed self Patient Positioning: Upright in chair Baseline Vocal Quality: Clear Volitional Cough: Strong Volitional Swallow: Able to elicit    Oral/Motor/Sensory Function Overall Oral Motor/Sensory Function: Appears within functional limits for tasks assessed (tongue with cracking of tissue, baseline per patient)   Ice Chips Ice chips: Not tested   Thin Liquid Thin Liquid: Within functional limits Presentation: Cup;Straw;Self Fed    Nectar Thick Nectar Thick Liquid: Not tested   Honey Thick Honey Thick Liquid: Not tested   Puree Puree: Within functional limits Presentation: Self Fed;Spoon   Solid   GO   Poonam Woehrle MA, CCC-SLP [phone]  Solid: Within functional limits Presentation: Self Fed       Ferdinando Lodge Meryl 02/14/2014,11:38 AM

## 2014-02-14 NOTE — Progress Notes (Signed)
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  She was noted to have an elevated WBC and elevated BNP.  She was mildly hypoxic on ambulation in the ED to 88%.  CXR of the chest was non revealing, but CT of the chest showed multilobar pneumonia and she was admitted for treatment of CAP.  In addition in the ED, the patient was noted to have a prominent right sided goiter and was evaluated by ENT.  Last XRT therapy was in September of 2015.    On 2/14, patient began having pair PVCs on telemetry.  She was not reporting chest pain, but was having some back pain.  Repeat EKG unchanged from previous on admission, but it does look different with a wider QRS compared to old from 2014.  BNP was elevated.  TnI was also elevated to 0.04.  Repeat TnI was 0.12.  She is not on a beta blocker.  She was last seen by Cardiology, Dr. Acie Fredrickson on 04/01/13.    Assessment/Plan:    Multilobar CAP with sepsis (temperature, leukocytosis) - Patient reports feeling better today - On O2 as needed - Nebulizers - Antibiotics with azithromycin and rocephin - Swallow eval noted regular diet with thin liquids - BC X 2 pending - Sputum culture if possible - Urinary antigens pending - HIV pending    Goiter - Seen on CT scan, known to patient - ENT evaluated and patient was not interested in surgery or further work up    Acute on chronic systolic heart failure - BNP elevated, likely related to pneumonia - Lasix 20mg  IV BID - Lisinopril continued - HR in the 60s, so beta blocker not started (she is not on one at home, possibly related to HR) - I/O's - daily weight - Aspirin  H/O Cervical Cancer - It appears by  chart review she had XRT with good response to therapy - She is to follow up with gyn onc outpatient  PVCs - EKG noted, she has some TWI in II, III, avf and lateral leads, these appear mostly chronic but also some possible ST depressions in lateral leads.  TnI are trending up, but only mildly.   - She is being treated for a mild CHF exacerbation and she is acutely ill, which could explain some of these findings.   - Trend CE, she is on an aspirin - Will add a low dose statin due to heart failure diagnosis   Code Status: Full.  Family Communication:  plan of care discussed with the patient Disposition Plan: Home when stable.   IV access:  Peripheral IV  Procedures and diagnostic studies:    Ct Soft Tissue Neck W Contrast  02/13/2014   CLINICAL DATA:  Shortness of breath.  EXAM: CT NECK WITH CONTRAST  TECHNIQUE: Multidetector CT imaging of the neck was performed using the standard protocol following the bolus administration of intravenous contrast.  CONTRAST:  148mL OMNIPAQUE IOHEXOL 350 MG/ML SOLN  COMPARISON:  PET scan 07/14/2013.  FINDINGS: Pharynx and larynx: LEFT-to-RIGHT displacement air column larynx by large goiter described below. In the or pharyngeal region there is mass effect on the hypopharynx from the RIGHT, also described below. Potentially symptomatic narrowing of the airway in  the supraglottic region. No mucosal lesion is evident.  Salivary glands: Unremarkable.  Thyroid: Massive goiter. Heterogeneous appearance with multiple calcifications. Inferiorly the gland is larger on the LEFT, with early BILATERAL substernal extension. As noted from PET scan, there is a hypermetabolic component which extends superiorly from the RIGHT lobe having cross-section 54 x 39 mm. This lesion has not dramatically progressed since July of 2015 but remains concerning for malignant degeneration of goiter. ENT consultation with tissue sampling is warranted.  Lymph nodes: No pathologic adenopathy.   Vascular: Lateral displacement of the carotid sheath on the RIGHT. Minor atheromatous changes bilaterally.  Limited intracranial: Negative.  Visualized orbits: BILATERAL cataract extraction. No acute features.  Mastoids and visualized paranasal sinuses: Moderate sphenoid sinus disease with layering fluid suggesting acuity. No other significant sinus opacity. No mastoid fluid.  Skeleton: For a minor degenerative disc disease. No osseous lesions.  Upper chest: Reported separately.  IMPRESSION: Massive goiter, with the dominant hypermetabolic (on prior PET) component on the RIGHT potentially compromising the supraglottic airway having cross-sectional measurements of 54 x 39 mm. Concern raised for malignant degeneration of goiter with thyroid carcinoma. ENT consultation with tissue sampling is warranted.   Electronically Signed   By: Rolla Flatten M.D.   On: 02/13/2014 12:31   Ct Angio Chest Pe W/cm &/or Wo Cm  02/13/2014   CLINICAL DATA:  Dyspnea Worsening SOB since yesterday Productive cough Chest pain Hx:Hx Squamous cell ca HTN COPD CHF Hysterectomy  EXAM: CT ANGIOGRAPHY CHEST WITH CONTRAST  TECHNIQUE: Multidetector CT imaging of the chest was performed using the standard protocol during bolus administration of intravenous contrast. Multiplanar CT image reconstructions and MIPs were obtained to evaluate the vascular anatomy.  CONTRAST:  182mL OMNIPAQUE IOHEXOL 350 MG/ML SOLN  COMPARISON:  07/14/2013 AND EARLIER STUDIES  FINDINGS: The svc is patent. Mild four-chamber cardiac enlargement. Mild reflux from right atrium into central hepatic veins. rv/lv ratio less than 1, normal. dilated central pulmonary arteries. satisfactory opacification of pulmonary arteries noted, and there is no evidence of pulmonary emboli. patient breathing during the acquisition degrades some of the images. patent superior and inferior pulmonary veins bilaterally. moderate coronary calcifications. Adequate contrast opacification of the  thoracic aorta with no evidence of dissection, aneurysm, or stenosis. There is bovine variant brachiocephalic arch anatomy without proximal stenosis.  No pleural or pericardial effusion. Subcentimeter prevascular, AP window, and pretracheal lymph nodes, stable. Patchy airspace opacities in the inferior lingula and anterior left upper lobe, right suprahilar region in the right upper lobe, and in anterior and lateral basal segments right lower lobe, new since prior scan.  Review of the MIP images confirms the above findings. Massively enlarged thyroid measure in at least 11.6 cm transverse diameter with multiple nodules and at the coarse calcifications, incompletely visualized, resulting in mild rightward tracheal deviation without narrowing. Bridging osteophytes across multiple levels in the mid and lower thoracic spine. Advanced degenerative disc disease at multiple contiguous levels in the thoracolumbar junction. Sternum is a intact. Complex 19 mm right renal cyst, incompletely visualized. Bilateral adrenal hyperplasia. Remainder visualized upper abdomen unremarkable.  IMPRESSION: 1. Negative for acute PE or thoracic aortic dissection. 2. Patchy airspace opacities in the lingula, anterior left upper lobe, right upper and lower lobes, new since prior study, suggesting multifocal pneumonia. Consider follow-up to confirm appropriate resolution. 3. Thyromegaly with multiple nodules, incompletely visualized. 4. Atherosclerosis, including aortic and coronary artery disease. Please note that although the presence of coronary artery calcium documents the presence of coronary artery disease,  the severity of this disease and any potential stenosis cannot be assessed on this non-gated CT examination. Assessment for potential risk factor modification, dietary therapy or pharmacologic therapy may be warranted, if clinically indicated.   Electronically Signed   By: Lucrezia Europe M.D.   On: 02/13/2014 12:19   Dg Chest Port 1  View  02/13/2014   CLINICAL DATA:  SOB (ongoing but was worse the past couple of days, had trouble breathing last night), cough, congestion, lower left CP recently as wellHx HTN, CHF, pna, ex-smoker (quit 40 years ago)  EXAM: PORTABLE CHEST - 1 VIEW  COMPARISON:  PET-CT 07/14/2013  FINDINGS: Mild perihilar and bibasilar coarse interstitial opacities, without convincing change from previous study. Heart size upper limits normal. No effusion. Spurring in the mid and lower thoracic spine. Advanced degenerative changes in the right shoulder.  IMPRESSION: 1. Stable mild cardiomegaly and chronic interstitial changes. No acute disease.   Electronically Signed   By: Lucrezia Europe M.D.   On: 02/13/2014 09:21    Medical Consultants:  ENT  Other Consultants:    Anti-Infectives:   Azithromycin 2/13 --> current Rocephin 2/13 --> current  Gilles Chiquito, MD  [place] Pager [phone]  If 7PM-7AM, please contact night-coverage [email or web address] Password [facility] 02/14/2014, 10:56 AM   LOS: 1 day   HPI/Subjective: No events overnight.   Objective: Filed Vitals:   02/13/14 1400 02/13/14 1512 02/13/14 2206 02/14/14 0608  BP: 117/47 106/58 106/47 121/53  Pulse: 69 62 66 66  Temp:  98.5 F (36.9 C) 98.4 F (36.9 C) 99.7 F (37.6 C)  TempSrc:  Oral Oral Oral  Resp: 29 18 18 18   Height:  5\' 3"  (1.6 m)    Weight:  146 lb 11.2 oz (66.543 kg)  147 lb 11.3 oz (67 kg)  SpO2: 95% 98% 96% 93%    Intake/Output Summary (Last 24 hours) at 02/14/14 1056 Last data filed at 02/14/14 0948  Gross per 24 hour  Intake 976.25 ml  Output    975 ml  Net   1.25 ml    Exam:   General:  Elderly woman, NAD, Pt is alert, follows commands appropriately  Cardiovascular: normal rate and irregular rhythm, S1/S2, no murmurs  Respiratory: course breath sounds throughout with occasional expiratory wheezing, crackles at bases.   Abdomen: Soft, non tender, non distended, bowel sounds present  Extremities: No edema, no  rash  Neuro: Grossly nonfocal  Data Reviewed: Basic Metabolic Panel:  Recent Labs Lab 02/13/14 0838 02/13/14 1815 02/14/14 0502  NA 137  --  137  K 3.8  --  3.6  CL 100  --  102  CO2 27  --  26  GLUCOSE 131*  --  109*  BUN 13  --  15  CREATININE 0.83 1.00 0.98  CALCIUM 8.7  --  8.6   Liver Function Tests: No results for input(s): AST, ALT, ALKPHOS, BILITOT, PROT, ALBUMIN in the last 168 hours. No results for input(s): LIPASE, AMYLASE in the last 168 hours. No results for input(s): AMMONIA in the last 168 hours. CBC:  Recent Labs Lab 02/13/14 0838 02/13/14 1815 02/14/14 0502  WBC 15.2* 14.6* 14.1*  HGB 12.9 11.5* 10.7*  HCT 37.1 35.0* 32.6*  MCV 89.6 92.6 92.4  PLT 236 236 211   Cardiac Enzymes:  Recent Labs Lab 02/13/14 0838  TROPONINI 0.07*   BNP: Invalid input(s): POCBNP CBG: No results for input(s): GLUCAP in the last 168 hours.  No results found for this or any  previous visit (from the past 240 hour(s)).   Scheduled Meds: . albuterol  2.5 mg Nebulization QID  . antiseptic oral rinse  7 mL Mouth Rinse BID  . aspirin EC  81 mg Oral Daily  . azithromycin  500 mg Intravenous Q24H  . cefTRIAXone (ROCEPHIN)  IV  1 g Intravenous Q24H  . enoxaparin (LOVENOX) injection  40 mg Subcutaneous Q24H  . furosemide  20 mg Intravenous Q12H  . lisinopril  10 mg Oral q morning - 10a  . sodium chloride  3 mL Intravenous Q12H   Continuous Infusions:

## 2014-02-14 NOTE — Progress Notes (Signed)
Utilization Review Completed.[name] T2/14/2016  

## 2014-02-14 NOTE — Progress Notes (Signed)
  Echocardiogram 2D Echocardiogram has been performed.  [name] 02/14/2014, 4:31 PM

## 2014-02-15 LAB — BASIC METABOLIC PANEL
Anion gap: 5 (ref 5–15)
BUN: 19 mg/dL (ref 6–23)
CO2: 28 mmol/L (ref 19–32)
Calcium: 7.9 mg/dL — ABNORMAL LOW (ref 8.4–10.5)
Chloride: 102 mmol/L (ref 96–112)
Creatinine, Ser: 1.04 mg/dL (ref 0.50–1.10)
GFR calc Af Amer: 55 mL/min — ABNORMAL LOW (ref >90)
GFR calc non Af Amer: 47 mL/min — ABNORMAL LOW (ref >90)
Glucose, Bld: 123 mg/dL — ABNORMAL HIGH (ref 70–99)
Potassium: 3.3 mmol/L — ABNORMAL LOW (ref 3.5–5.1)
Sodium: 135 mmol/L (ref 135–145)

## 2014-02-15 LAB — TROPONIN I: TROPONIN I: 0.09 ng/mL — AB (ref <0.031)

## 2014-02-15 LAB — STREP PNEUMONIAE URINARY ANTIGEN: Strep Pneumo Urinary Antigen: NEGATIVE

## 2014-02-15 LAB — HIV ANTIBODY (ROUTINE TESTING W REFLEX): HIV Screen 4th Generation wRfx: NONREACTIVE

## 2014-02-15 MED ORDER — AZITHROMYCIN 500 MG PO TABS
500.0000 mg | ORAL_TABLET | Freq: Every day | ORAL | Status: DC
  Administered 2014-02-15 – 2014-02-17 (×3): 500 mg via ORAL
  Filled 2014-02-15 (×3): qty 1

## 2014-02-15 NOTE — Evaluation (Signed)
Physical Therapy Evaluation Patient Details Name: [name] MRN: [id number] DOB: May 10, 1925 Today's Date: 02/15/2014   History of Present Illness  With past medical history of COPD, systolic heart failure and previous goiter who has been in good state of health when she started developing progressively worsening dyspnea and productive cough with greenish sputum starting 1 day prior to admission. With symptoms progressing, patient decided to come into the emergency room for further evaluation. Mr. chest x-ray was unrevealing. White count was noted to be 15 and BNP elevated at almost 1000. Patient noted to be mildly hypoxic with ambulation on room air at 88%. With 2 L up to 94%. CT scan of the chest done noted multi lobar pneumonia. Patient given IV antibiotics.  Clinical Impression  Pt admitted with/for SOB and determined by CT to have multilobar pna.  Pt currently limited functionally due to the problems listed below.  (see problems list.)  Pt will benefit from PT to maximize function and safety to be able to get home safely with available assist of family.     Follow Up Recommendations Home health PT;Supervision - Intermittent    Equipment Recommendations  None recommended by PT    Recommendations for Other Services       Precautions / Restrictions Precautions Precautions: Fall      Mobility  Bed Mobility Overal bed mobility: Modified Independent             General bed mobility comments: needing extra time, but no assist  Transfers Overall transfer level: Modified independent   Transfers: Sit to/from Stand Sit to Stand: Modified independent (Device/Increase time)            Ambulation/Gait Ambulation/Gait assistance: Supervision Ambulation Distance (Feet): 90 Feet (X2) Assistive device: None Gait Pattern/deviations: Step-through pattern Gait velocity: SLOW   General Gait Details: slow gait, mildly unsteady at times, but steady with something to touch as in a  homelike envrironment.  Outside would need a RW for stability on uneven surfaces.  Stairs            Wheelchair Mobility    Modified Rankin (Stroke Patients Only)       Balance Overall balance assessment: Needs assistance Sitting-balance support: No upper extremity supported Sitting balance-Leahy Scale: Fair     Standing balance support: No upper extremity supported Standing balance-Leahy Scale: Fair                               Pertinent Vitals/Pain Pain Assessment: No/denies pain    Home Living Family/patient expects to be discharged to:: Private residence Living Arrangements: Alone Available Help at Discharge: Family;Available PRN/intermittently Type of Home: House Home Access: Stairs to enter Entrance Stairs-Rails:  (storm door used) Technical brewer of Steps: 2 Home Layout: One level Home Equipment: Walker - 2 wheels;Cane - single point;Shower seat;Grab bars - tub/shower;Other (comment) (handicapped height toilet)      Prior Function Level of Independence: Independent;Independent with assistive device(s)               Hand Dominance        Extremity/Trunk Assessment   Upper Extremity Assessment: Defer to OT evaluation           Lower Extremity Assessment: Overall WFL for tasks assessed      Cervical / Trunk Assessment: Normal  Communication   Communication: No difficulties  Cognition Arousal/Alertness: Awake/alert Behavior During Therapy: WFL for tasks assessed/performed Overall Cognitive Status: Within  Functional Limits for tasks assessed                      General Comments General comments (skin integrity, edema, etc.): SpO2 in the mid to upper 90's and EHR in th 80's on RA during ambulation.    Exercises        Assessment/Plan    PT Assessment Patient needs continued PT services  PT Diagnosis Other (comment) (decreased activity tolerance)   PT Problem List Decreased activity  tolerance;Decreased balance;Decreased mobility;Decreased knowledge of use of DME;Cardiopulmonary status limiting activity  PT Treatment Interventions DME instruction;Gait training;Functional mobility training;Therapeutic activities;Patient/family education;Therapeutic exercise;Balance training   PT Goals (Current goals can be found in the Care Plan section) Acute Rehab PT Goals Patient Stated Goal: home independent PT Goal Formulation: With patient Time For Goal Achievement: 02/22/14 Potential to Achieve Goals: Good    Frequency Min 3X/week   Barriers to discharge        Co-evaluation               End of Session   Activity Tolerance: Patient tolerated treatment well Patient left: in chair;with call bell/phone within reach Nurse Communication: Mobility status         Time: 1700-1725 PT Time Calculation (min) (ACUTE ONLY): 25 min   Charges:   PT Evaluation $Initial PT Evaluation Tier I: 1 Procedure PT Treatments $Gait Training: 8-22 mins   PT G Codes:        Orissa Arreaga, Tessie Fass 02/15/2014, 5:39 PM 02/15/2014  Donnella Sham, PT [phone]  (pager)

## 2014-02-15 NOTE — Progress Notes (Signed)
Medicare Important Message given?  YES (If response is "NO", the following Medicare IM given date fields will be blank) Date Medicare IM given:  02/15/14 Medicare IM given by:  Slater Mcmanaman 

## 2014-02-15 NOTE — Progress Notes (Signed)
[facility] PROGRESS NOTE  ARLYNN STARE SWF:[id number] DOB: [birth year] DOA: 02/13/2014 PCP: Leonard Downing, MD   Brief narrative:   Ms. 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  She was noted to have an elevated WBC and elevated BNP.  She was mildly hypoxic on ambulation in the ED to 88%.  CXR of the chest was non revealing, but CT of the chest showed multilobar pneumonia and she was admitted for treatment of CAP.  In addition in the ED, the patient was noted to have a prominent right sided goiter and was evaluated by ENT.  Last XRT therapy was in September of 2015.    On 2/14, patient began having pair PVCs on telemetry.  She was not reporting chest pain, but was having some back pain.  Repeat EKG unchanged from previous on admission, but it does look different with a wider QRS compared to old from 2014.  BNP was elevated.  TnI was also elevated to 0.04.  Repeat TnI was 0.12.  She is not on a beta blocker.  She was last seen by Cardiology, Dr. Acie Fredrickson on 04/01/13.    Assessment/Plan:    Multilobar CAP with sepsis (temperature, leukocytosis) Patient continues to improve. Breathing comfortably. Change antibiotics in the morning to by mouth.    Goiter - Seen on CT scan, known to patient - ENT evaluated and patient was not interested in surgery or further work up    Acute on chronic systolic heart failure Has dropped 2 pounds since admission. Responding well to IV Lasix. Follow basic metabolic panel in the morning. Should be back to dry weight by morning.  H/O Cervical Cancer - It appears by chart review she had XRT with good response to therapy - She is to follow up with gyn onc outpatient  PVCs - EKG noted, she has some TWI in II, III, avf and lateral leads, these appear mostly chronic but also some possible ST  depressions in lateral leads.  TnI are trending up, but only mildly.   - She is being treated for a mild CHF exacerbation and she is acutely ill, which could explain some of these findings.   - Trend CE, she is on an aspirin - Will add a low dose statin due to heart failure diagnosis Overall remaining stable   Code Status: Full.  Family Communication:  plan of care discussed with the patient Disposition Plan: Home when stable.   IV access:  Peripheral IV  Procedures and diagnostic studies:    Ct Soft Tissue Neck W Contrast  02/13/2014   CLINICAL DATA:  Shortness of breath.  EXAM: CT NECK WITH CONTRAST  TECHNIQUE: Multidetector CT imaging of the neck was performed using the standard protocol following the bolus administration of intravenous contrast.  CONTRAST:  181mL OMNIPAQUE IOHEXOL 350 MG/ML SOLN  COMPARISON:  PET scan 07/14/2013.  FINDINGS: Pharynx and larynx: LEFT-to-RIGHT displacement air column larynx by large goiter described below. In the or pharyngeal region there is mass effect on the hypopharynx from the RIGHT, also described below. Potentially symptomatic narrowing of the airway in the supraglottic region. No mucosal lesion is evident.  Salivary glands: Unremarkable.  Thyroid: Massive goiter. Heterogeneous appearance with multiple calcifications. Inferiorly the gland is larger on the LEFT, with early BILATERAL substernal extension. As noted from PET scan, there is a hypermetabolic component which  extends superiorly from the RIGHT lobe having cross-section 54 x 39 mm. This lesion has not dramatically progressed since July of 2015 but remains concerning for malignant degeneration of goiter. ENT consultation with tissue sampling is warranted.  Lymph nodes: No pathologic adenopathy.  Vascular: Lateral displacement of the carotid sheath on the RIGHT. Minor atheromatous changes bilaterally.  Limited intracranial: Negative.  Visualized orbits: BILATERAL cataract extraction. No acute features.   Mastoids and visualized paranasal sinuses: Moderate sphenoid sinus disease with layering fluid suggesting acuity. No other significant sinus opacity. No mastoid fluid.  Skeleton: For a minor degenerative disc disease. No osseous lesions.  Upper chest: Reported separately.  IMPRESSION: Massive goiter, with the dominant hypermetabolic (on prior PET) component on the RIGHT potentially compromising the supraglottic airway having cross-sectional measurements of 54 x 39 mm. Concern raised for malignant degeneration of goiter with thyroid carcinoma. ENT consultation with tissue sampling is warranted.   Electronically Signed   By: Rolla Flatten M.D.   On: 02/13/2014 12:31   Ct Angio Chest Pe W/cm &/or Wo Cm  02/13/2014   CLINICAL DATA:  Dyspnea Worsening SOB since yesterday Productive cough Chest pain Hx:Hx Squamous cell ca HTN COPD CHF Hysterectomy  EXAM: CT ANGIOGRAPHY CHEST WITH CONTRAST  TECHNIQUE: Multidetector CT imaging of the chest was performed using the standard protocol during bolus administration of intravenous contrast. Multiplanar CT image reconstructions and MIPs were obtained to evaluate the vascular anatomy.  CONTRAST:  172mL OMNIPAQUE IOHEXOL 350 MG/ML SOLN  COMPARISON:  07/14/2013 AND EARLIER STUDIES  FINDINGS: The svc is patent. Mild four-chamber cardiac enlargement. Mild reflux from right atrium into central hepatic veins. rv/lv ratio less than 1, normal. dilated central pulmonary arteries. satisfactory opacification of pulmonary arteries noted, and there is no evidence of pulmonary emboli. patient breathing during the acquisition degrades some of the images. patent superior and inferior pulmonary veins bilaterally. moderate coronary calcifications. Adequate contrast opacification of the thoracic aorta with no evidence of dissection, aneurysm, or stenosis. There is bovine variant brachiocephalic arch anatomy without proximal stenosis.  No pleural or pericardial effusion. Subcentimeter prevascular, AP  window, and pretracheal lymph nodes, stable. Patchy airspace opacities in the inferior lingula and anterior left upper lobe, right suprahilar region in the right upper lobe, and in anterior and lateral basal segments right lower lobe, new since prior scan.  Review of the MIP images confirms the above findings. Massively enlarged thyroid measure in at least 11.6 cm transverse diameter with multiple nodules and at the coarse calcifications, incompletely visualized, resulting in mild rightward tracheal deviation without narrowing. Bridging osteophytes across multiple levels in the mid and lower thoracic spine. Advanced degenerative disc disease at multiple contiguous levels in the thoracolumbar junction. Sternum is a intact. Complex 19 mm right renal cyst, incompletely visualized. Bilateral adrenal hyperplasia. Remainder visualized upper abdomen unremarkable.  IMPRESSION: 1. Negative for acute PE or thoracic aortic dissection. 2. Patchy airspace opacities in the lingula, anterior left upper lobe, right upper and lower lobes, new since prior study, suggesting multifocal pneumonia. Consider follow-up to confirm appropriate resolution. 3. Thyromegaly with multiple nodules, incompletely visualized. 4. Atherosclerosis, including aortic and coronary artery disease. Please note that although the presence of coronary artery calcium documents the presence of coronary artery disease, the severity of this disease and any potential stenosis cannot be assessed on this non-gated CT examination. Assessment for potential risk factor modification, dietary therapy or pharmacologic therapy may be warranted, if clinically indicated.   Electronically Signed   By: Eden Emms.D.  On: 02/13/2014 12:19   Dg Chest Port 1 View  02/13/2014   CLINICAL DATA:  SOB (ongoing but was worse the past couple of days, had trouble breathing last night), cough, congestion, lower left CP recently as wellHx HTN, CHF, pna, ex-smoker (quit 40 years ago)   EXAM: PORTABLE CHEST - 1 VIEW  COMPARISON:  PET-CT 07/14/2013  FINDINGS: Mild perihilar and bibasilar coarse interstitial opacities, without convincing change from previous study. Heart size upper limits normal. No effusion. Spurring in the mid and lower thoracic spine. Advanced degenerative changes in the right shoulder.  IMPRESSION: 1. Stable mild cardiomegaly and chronic interstitial changes. No acute disease.   Electronically Signed   By: Lucrezia Europe M.D.   On: 02/13/2014 09:21    Medical Consultants:  ENT  Other Consultants:    Anti-Infectives:   Azithromycin 2/13 --> current Rocephin 2/13 --> current  Annita Brod, MD  [facility] Pager [phone]  If 7PM-7AM, please contact night-coverage [email or web address] Password [email or web address] 02/15/2014, 3:58 PM   LOS: 2 days   HPI/Subjective: Patient doing well. No events overnight. Diuresing some. Breathing easier today. No cough.  Objective: Filed Vitals:   02/15/14 1022 02/15/14 1225 02/15/14 1338 02/15/14 1550  BP: 102/46  98/43   Pulse:   66   Temp:   97.8 F (36.6 C)   TempSrc:   Oral   Resp:   18   Height:      Weight:      SpO2:  92% 96% 93%    Intake/Output Summary (Last 24 hours) at 02/15/14 1558 Last data filed at 02/15/14 1414  Gross per 24 hour  Intake   1090 ml  Output   1250 ml  Net   -160 ml    Exam:   General:  Alert and oriented 2, no acute distress  Cardiovascular: Irregular rhythm, rate controlled  Respiratory: Mostly clear, decreased at bases.   Abdomen: Soft, non tender, non distended, bowel sounds present  Extremities: No edema  Data Reviewed: Basic Metabolic Panel:  Recent Labs Lab 02/13/14 0838 02/13/14 1815 02/14/14 0502 02/15/14 0313  NA 137  --  137 135  K 3.8  --  3.6 3.3*  CL 100  --  102 102  CO2 27  --  26 28  GLUCOSE 131*  --  109* 123*  BUN 13  --  15 19  CREATININE 0.83 1.00 0.98 1.04  CALCIUM 8.7  --  8.6 7.9*   Liver Function Tests: No results for input(s): AST, ALT,  ALKPHOS, BILITOT, PROT, ALBUMIN in the last 168 hours. No results for input(s): LIPASE, AMYLASE in the last 168 hours. No results for input(s): AMMONIA in the last 168 hours. CBC:  Recent Labs Lab 02/13/14 0838 02/13/14 1815 02/14/14 0502  WBC 15.2* 14.6* 14.1*  HGB 12.9 11.5* 10.7*  HCT 37.1 35.0* 32.6*  MCV 89.6 92.6 92.4  PLT 236 236 211   Cardiac Enzymes:  Recent Labs Lab 02/13/14 0838 02/14/14 1520 02/14/14 2156 02/15/14 0313  TROPONINI 0.07* 0.12* 0.08* 0.09*   BNP: Invalid input(s): POCBNP CBG: No results for input(s): GLUCAP in the last 168 hours.  Recent Results (from the past 240 hour(s))  Culture, blood (routine x 2) Call MD if unable to obtain prior to antibiotics being given     Status: None (Preliminary result)   Collection Time: 02/13/14  6:15 PM  Result Value Ref Range Status   Specimen Description BLOOD RIGHT ARM  Final   Special Requests BOTTLES DRAWN AEROBIC  AND ANAEROBIC 5CC  Final   Culture   Final           BLOOD CULTURE RECEIVED NO GROWTH TO DATE CULTURE WILL BE HELD FOR 5 DAYS BEFORE ISSUING A FINAL NEGATIVE REPORT Performed at [facility]    Report Status PENDING  Incomplete  Culture, blood (routine x 2) Call MD if unable to obtain prior to antibiotics being given     Status: None (Preliminary result)   Collection Time: 02/13/14  6:30 PM  Result Value Ref Range Status   Specimen Description BLOOD RIGHT HAND  Final   Special Requests BOTTLES DRAWN AEROBIC AND ANAEROBIC 5CC  Final   Culture   Final           BLOOD CULTURE RECEIVED NO GROWTH TO DATE CULTURE WILL BE HELD FOR 5 DAYS BEFORE ISSUING A FINAL NEGATIVE REPORT Performed at [facility]    Report Status PENDING  Incomplete     Scheduled Meds: . albuterol  2.5 mg Nebulization QID  . antiseptic oral rinse  7 mL Mouth Rinse BID  . aspirin EC  81 mg Oral Daily  . azithromycin  500 mg Oral Daily  . cefTRIAXone (ROCEPHIN)  IV  1 g Intravenous Q24H  . enoxaparin  (LOVENOX) injection  40 mg Subcutaneous Q24H  . furosemide  20 mg Intravenous Q12H  . lisinopril  10 mg Oral q morning - 10a  . simvastatin  10 mg Oral q1800  . sodium chloride  3 mL Intravenous Q12H   Continuous Infusions:

## 2014-02-15 NOTE — Progress Notes (Signed)
Lab called stating there was not enough urine specimen to add on for strep pneumonia urinary antigen and legionella antigen and to reorder  to be collected.

## 2014-02-16 ENCOUNTER — Inpatient Hospital Stay (HOSPITAL_COMMUNITY): Payer: Medicare Other

## 2014-02-16 LAB — LEGIONELLA ANTIGEN, URINE

## 2014-02-16 LAB — BASIC METABOLIC PANEL
ANION GAP: 12 (ref 5–15)
BUN: 15 mg/dL (ref 6–23)
CALCIUM: 8.4 mg/dL (ref 8.4–10.5)
CO2: 25 mmol/L (ref 19–32)
Chloride: 100 mmol/L (ref 96–112)
Creatinine, Ser: 0.95 mg/dL (ref 0.50–1.10)
GFR calc Af Amer: 61 mL/min — ABNORMAL LOW (ref >90)
GFR calc non Af Amer: 53 mL/min — ABNORMAL LOW (ref >90)
Glucose, Bld: 127 mg/dL — ABNORMAL HIGH (ref 70–99)
Potassium: 3.4 mmol/L — ABNORMAL LOW (ref 3.5–5.1)
SODIUM: 137 mmol/L (ref 135–145)

## 2014-02-16 LAB — BRAIN NATRIURETIC PEPTIDE: B NATRIURETIC PEPTIDE 5: 381.7 pg/mL — AB (ref 0.0–100.0)

## 2014-02-16 MED ORDER — ZOLPIDEM TARTRATE 5 MG PO TABS
2.5000 mg | ORAL_TABLET | Freq: Once | ORAL | Status: AC
  Administered 2014-02-16: 2.5 mg via ORAL
  Filled 2014-02-16: qty 1

## 2014-02-16 NOTE — Progress Notes (Signed)
Speech Language Pathology Treatment: Dysphagia  Patient Details Name: [name] MRN: [id number] DOB: [birth year] Today's Date: 02/16/2014 Time: 5449-2010 SLP Time Calculation (min) (ACUTE ONLY): 14 min  Assessment / Plan / Recommendation Clinical Impression  Pt seen for f/u swallowing treatment focused on diet tolerance and utilization of recommended strategies. Pt verbalized strategies independently, and then was observed to utilize throughout PO intake with Mod I. No overt s/s of aspiration were observed. SLP provided reinforcement of education including risk factors, with patient verbalizing her understanding. No further SLP f/u warranted at this time.   HPI HPI: 81 y.o. female with past medical history of COPD, systolic heart failure and previous goiter who has been in good state of health when she started developing progressively worsening dyspnea and productive cough with greenish sputum starting 1 day prior to admission. With symptoms progressing, patient decided to come into the emergency room for further evaluation. CT scan of chest showed multilobar PNA. In addition, patient noted to have prominent right sided goiter with some tracheal deviation. ENT consulted. Patient considering options (intervention vs no intervention). Patient also with h/o a respiratory based dysphagia during 03/2012 admission. Initially on nectar thick liquids based on bedside evaluation however advanced to thin liquids per MBS prior to d/c. Pt reports no difficulty swallowing except for with large pills, likely coinciding with h/o GERD.    Pertinent Vitals Pain Assessment: No/denies pain  SLP Plan  All goals met    Recommendations Diet recommendations: Regular;Thin liquid Liquids provided via: Cup;No [name] Medication Administration: Whole meds with liquid Supervision: Patient able to self feed;Intermittent supervision to cue for compensatory strategies Compensations: Slow rate;Small sips/bites Postural Changes  and/or Swallow Maneuvers: Seated upright 90 degrees       Oral Care Recommendations: Oral care BID Follow up Recommendations: None Plan: All goals met    GO      Germain Osgood, M.A. CCC-SLP [phone]  Germain Osgood 02/16/2014, 11:45 AM

## 2014-02-16 NOTE — Progress Notes (Signed)
Notified Baltazar Najjar, NP that pt requesting sleep medication. Baltazar Najjar, NP put in order for ambien 2.5mg  pox1. Will continue to monitor pt. Ranelle Oyster, RN

## 2014-02-16 NOTE — Care Management Note (Signed)
    Page 1 of 2   02/17/2014     4:29:12 PM CARE MANAGEMENT NOTE 02/17/2014  Patient:  [name], [name]   Account Number:  [id number]  Date Initiated:  02/15/2014  Documentation initiated by:  Tomi Bamberger  Subjective/Objective Assessment:   dx sepsis, cap  admit- livesl alone.     Action/Plan:   pt eval- hhpt  home oxgyen.   Anticipated DC Date:  02/16/2014   Anticipated DC Plan:  [place]  CM consult      [facility] Choice  HOME HEALTH   Choice offered to / List presented to:             Status of service:  In process, will continue to follow Medicare Important Message given?  YES (If response is "NO", the following Medicare IM given date fields will be blank) Date Medicare IM given:  02/15/2014 Medicare IM given by:  Tomi Bamberger Date Additional Medicare IM given:   Additional Medicare IM given by:    Discharge Disposition:    Per UR Regulation:  Reviewed for med. necessity/level of care/duration of stay  If discussed at [place] of Stay Meetings, dates discussed:    Comments:  02-17-14 1627 Jacqlyn Krauss, RN,BSN [phone] CM did call pt in ref to [facility] services with [facility]. Pt agreeable to [facility] services and CM did make pt aware that they will be calling. No further needs from CM at this time.    02/17/2014 @1540  Whitman Hero RN,BSN Unable to utilize [facility] and [facility], CM then called [facility] and referral fax to them. Awaiting for call back.  02/16/14 [place] ,BSN [phone] patient is for poss dc today, NCM will cont to follow.

## 2014-02-16 NOTE — Progress Notes (Signed)
PROGRESS NOTE  [name]:[id number] DOB: 07/24/1925 DOA: 02/13/2014 PCP: Leonard Downing, MD  HPI/Recap of past 85 hours: 81 year old female past medical history of COPD, CHF and goiter admitted on 2/13 with shortness of breath, productive cough and hypoxia. Patient noted to have elevated white blood cell count plus elevated BNP. Initial chest x-ray unrevealing, but CT noted multilobar pneumonia. Patient hospitalized for CHF and community-acquired pneumonia  Since admission, patient has had some diuresis. Patient herself has been feeling better, but still with some shortness of breath. She is not able to tolerate being off of oxygen-not on oxygen at home. Repeat chest x-ray notes pneumonia persistent. BNP down from 900-300. Assessment/Plan: Principal Problem:   Acute on chronic diastolic heart failure: Diuresing. Weight up today, but? Accuracy. Continue Lasix Active Problems:   CAP (community acquired pneumonia): Chest x-ray now with hydration and 2 view confirms pneumonia. Does not look any worse. Passed swallow evaluation. Continue antibiotics.   Overweight (BMI 25.0-29.9): Patient is criteria with BMI greater than 25   Goiter: Noting some tracheal deviation. Seen by ENT vision is not interested in surgery or further workup   Sepsis due to undetermined organism with acute respiratory failure   Elevated troponin: Likely secondary to CHF   Dyspnea: Multifactorial as above   Code Status: Full code  Family Communication: Daughter at the bedside  Disposition Plan: Home in the next 1-2 days once fully diuresed, likely with oxygen   Consultants:  None  Procedures:  Echocardiogram done 6/94: Grade 2 diastolic dysfunction  Antibiotics:  Rocephin and Zithromax 2/13-present   Objective: BP 124/54 mmHg  Pulse 66  Temp(Src) 98.6 F (37 C) (Oral)  Resp 24  Ht 5\' 3"  (1.6 m)  Wt 67.45 kg (148 lb 11.2 oz)  BMI 26.35 kg/m2  SpO2 98%  Intake/Output Summary (Last 24  hours) at 02/16/14 1902 Last data filed at 02/16/14 1842  Gross per 24 hour  Intake    360 ml  Output   1000 ml  Net   -640 ml   Filed Weights   02/15/14 0500 02/16/14 0631 02/16/14 0955  Weight: 66.815 kg (147 lb 4.8 oz) 68 kg (149 lb 14.6 oz) 67.45 kg (148 lb 11.2 oz)    Exam:   General:  Alert and oriented 3, winded because she went to the bathroom without oxygen  Cardiovascular: Regular rate and rhythm, W5-I6, 2/6 systolic ejection murmur  Respiratory: Decreased breath sounds throughout  Abdomen: Soft, nontender, nondistended 12 positive bowel sounds  Musculoskeletal: No clubbing or cyanosis, trace pitting edema   Data Reviewed: Basic Metabolic Panel:  Recent Labs Lab 02/13/14 0838 02/13/14 1815 02/14/14 0502 02/15/14 0313 02/16/14 0700  NA 137  --  137 135 137  K 3.8  --  3.6 3.3* 3.4*  CL 100  --  102 102 100  CO2 27  --  26 28 25   GLUCOSE 131*  --  109* 123* 127*  BUN 13  --  15 19 15   CREATININE 0.83 1.00 0.98 1.04 0.95  CALCIUM 8.7  --  8.6 7.9* 8.4   Liver Function Tests: No results for input(s): AST, ALT, ALKPHOS, BILITOT, PROT, ALBUMIN in the last 168 hours. No results for input(s): LIPASE, AMYLASE in the last 168 hours. No results for input(s): AMMONIA in the last 168 hours. CBC:  Recent Labs Lab 02/13/14 0838 02/13/14 1815 02/14/14 0502  WBC 15.2* 14.6* 14.1*  HGB 12.9 11.5* 10.7*  HCT 37.1 35.0* 32.6*  MCV 89.6 92.6  92.4  PLT 236 236 211   Cardiac Enzymes:    Recent Labs Lab 02/13/14 0838 02/14/14 1520 02/14/14 2156 02/15/14 0313  TROPONINI 0.07* 0.12* 0.08* 0.09*   BNP (last 3 results)  Recent Labs  02/13/14 0838 02/16/14 0700  BNP 938.8* 381.7*    ProBNP (last 3 results) No results for input(s): PROBNP in the last 8760 hours.  CBG: No results for input(s): GLUCAP in the last 168 hours.  Recent Results (from the past 240 hour(s))  Culture, blood (routine x 2) Call MD if unable to obtain prior to antibiotics being  given     Status: None (Preliminary result)   Collection Time: 02/13/14  6:15 PM  Result Value Ref Range Status   Specimen Description BLOOD RIGHT ARM  Final   Special Requests BOTTLES DRAWN AEROBIC AND ANAEROBIC 5CC  Final   Culture   Final           BLOOD CULTURE RECEIVED NO GROWTH TO DATE CULTURE WILL BE HELD FOR 5 DAYS BEFORE ISSUING A FINAL NEGATIVE REPORT Performed at [facility]    Report Status PENDING  Incomplete  Culture, blood (routine x 2) Call MD if unable to obtain prior to antibiotics being given     Status: None (Preliminary result)   Collection Time: 02/13/14  6:30 PM  Result Value Ref Range Status   Specimen Description BLOOD RIGHT HAND  Final   Special Requests BOTTLES DRAWN AEROBIC AND ANAEROBIC 5CC  Final   Culture   Final           BLOOD CULTURE RECEIVED NO GROWTH TO DATE CULTURE WILL BE HELD FOR 5 DAYS BEFORE ISSUING A FINAL NEGATIVE REPORT Performed at [facility]    Report Status PENDING  Incomplete     Studies: No results found.  Scheduled Meds: . antiseptic oral rinse  7 mL Mouth Rinse BID  . aspirin EC  81 mg Oral Daily  . azithromycin  500 mg Oral Daily  . cefTRIAXone (ROCEPHIN)  IV  1 g Intravenous Q24H  . enoxaparin (LOVENOX) injection  40 mg Subcutaneous Q24H  . furosemide  20 mg Intravenous Q12H  . lisinopril  10 mg Oral q morning - 10a  . simvastatin  10 mg Oral q1800  . sodium chloride  3 mL Intravenous Q12H    Continuous Infusions:    Time spent: 25 minutes  [facility] Pager [phone]. If 7PM-7AM, please contact night-coverage at [email or web address], password [email or web address] 02/16/2014, 7:02 PM  LOS: 3 days

## 2014-02-16 NOTE — Progress Notes (Signed)
SATURATION QUALIFICATIONS: (This note is used to comply with regulatory documentation for home oxygen)  Patient Saturations on Room Air at Rest = 92%  Patient Saturations on Room Air while Ambulating = 86%  Patient Saturations on 2 Liters of oxygen while Ambulating = 95%  Please briefly explain why patient needs home oxygen: CHF

## 2014-02-17 DIAGNOSIS — R7989 Other specified abnormal findings of blood chemistry: Secondary | ICD-10-CM

## 2014-02-17 DIAGNOSIS — I5033 Acute on chronic diastolic (congestive) heart failure: Secondary | ICD-10-CM

## 2014-02-17 DIAGNOSIS — R0600 Dyspnea, unspecified: Secondary | ICD-10-CM

## 2014-02-17 DIAGNOSIS — J9600 Acute respiratory failure, unspecified whether with hypoxia or hypercapnia: Secondary | ICD-10-CM

## 2014-02-17 DIAGNOSIS — R6520 Severe sepsis without septic shock: Secondary | ICD-10-CM

## 2014-02-17 DIAGNOSIS — E663 Overweight: Secondary | ICD-10-CM

## 2014-02-17 MED ORDER — GUAIFENESIN ER 1200 MG PO TB12: 1.0000 | ORAL_TABLET | Freq: Two times a day (BID) | ORAL | Status: DC

## 2014-02-17 MED ORDER — AZITHROMYCIN 500 MG PO TABS: 500.0000 mg | ORAL_TABLET | Freq: Every day | ORAL | Status: DC

## 2014-02-17 MED ORDER — CEFUROXIME AXETIL 500 MG PO TABS: 500.0000 mg | ORAL_TABLET | Freq: Two times a day (BID) | ORAL | Status: DC

## 2014-02-17 NOTE — Progress Notes (Signed)
Patient discharge teaching given, including activity, diet, follow-up appoints, and medications. Patient verbalized understanding of all discharge instructions. IV access was d/c'd. Vitals are stable. Skin is intact except as charted in most recent assessments. Pt to be escorted out by NT, to be driven home by family. Heart failure education packet given and explained.

## 2014-02-17 NOTE — Progress Notes (Signed)
Physical Therapy Treatment Patient Details Name: [name] MRN: [id number] DOB: 09-12-1925 Today's Date: 02/17/2014    History of Present Illness With past medical history of COPD, systolic heart failure and previous goiter who has been in good state of health when she started developing progressively worsening dyspnea and productive cough with greenish sputum starting 1 day prior to admission. With symptoms progressing, patient decided to come into the emergency room for further evaluation. Mr. chest x-ray was unrevealing. White count was noted to be 15 and BNP elevated at almost 1000. Patient noted to be mildly hypoxic with ambulation on room air at 88%. With 2 L up to 94%. CT scan of the chest done noted multi lobar pneumonia. Patient given IV antibiotics.    PT Comments    Progressing well in general, but more fatigued today after exercise and gait. See qualification note.   Follow Up Recommendations  Home health PT;Supervision - Intermittent     Equipment Recommendations  None recommended by PT    Recommendations for Other Services       Precautions / Restrictions Precautions Precautions: Fall Restrictions Weight Bearing Restrictions: No    Mobility  Bed Mobility Overal bed mobility: Modified Independent                Transfers Overall transfer level: Modified independent Equipment used: Rolling walker (2 wheeled) Transfers: Sit to/from Stand Sit to Stand: Modified independent (Device/Increase time)            Ambulation/Gait Ambulation/Gait assistance: Supervision Ambulation Distance (Feet): 210 Feet Assistive device: Rolling walker (2 wheeled) Gait Pattern/deviations: Step-through pattern Gait velocity: slower Gait velocity interpretation: Below normal speed for age/gender General Gait Details: slow and generally steady, but episodes of mild unsteadiness when she felt in distress.   Stairs            Wheelchair Mobility    Modified  Rankin (Stroke Patients Only)       Balance Overall balance assessment: Needs assistance Sitting-balance support: No upper extremity supported Sitting balance-Leahy Scale: Good       Standing balance-Leahy Scale: Fair                      Cognition Arousal/Alertness: Awake/alert Behavior During Therapy: WFL for tasks assessed/performed Overall Cognitive Status: Within Functional Limits for tasks assessed                      Exercises General Exercises - Lower Extremity Ankle Circles/Pumps: AROM;20 reps Quad Sets: AROM;Both;10 reps;Supine Gluteal Sets: AROM;Both;10 reps;Supine Heel Slides: AROM;10 reps;Supine (resisted) Straight Leg Raises: AROM;Both;10 reps;Supine    General Comments General comments (skin integrity, edema, etc.): SpO2 generally at 91/92% on RA , but dipped down to 86% with moderate dyspnea after about 180 feet.  With 2 L O2 sats rose to 94%.  EHR maintained in the low 90's      Pertinent Vitals/Pain Pain Assessment: No/denies pain    Home Living                      Prior Function            PT Goals (current goals can now be found in the care plan section) Acute Rehab PT Goals Patient Stated Goal: home independent PT Goal Formulation: With patient Time For Goal Achievement: 02/22/14 Potential to Achieve Goals: Good Progress towards PT goals: Progressing toward goals    Frequency  Min 3X/week  PT Plan Current plan remains appropriate    Co-evaluation             End of Session   Activity Tolerance: Patient tolerated treatment well;Patient limited by fatigue Patient left: in bed;with call bell/phone within reach;with family/visitor present     Time: 4142-3953 PT Time Calculation (min) (ACUTE ONLY): 27 min  Charges:  $Gait Training: 8-22 mins $Therapeutic Exercise: 8-22 mins                    G Codes:      Murice Barbar, Tessie Fass 02/17/2014, 12:07 PM 02/17/2014  Donnella Sham,  PT [phone]  (pager)

## 2014-02-17 NOTE — Discharge Summary (Signed)
Physician Discharge Summary  [name] IRC:[id number] DOB: 02-Nov-1925 DOA: 02/13/2014  PCP: Jacqueline Downing, MD  Admit date: 02/13/2014 Discharge date: 02/17/2014  Time spent: 40 minutes  Recommendations for Outpatient Follow-up:  1. Follow-up with primary care physician within one week.  Discharge Diagnoses:  Principal Problem:   Acute on chronic diastolic heart failure Active Problems:   CAP (community acquired pneumonia)   Overweight (BMI 25.0-29.9)   Goiter   Sepsis due to undetermined organism with acute respiratory failure   Elevated troponin   Dyspnea   Discharge Condition: Stable  Diet recommendation: Heart healthy  Filed Weights   02/16/14 0631 02/16/14 0955 02/17/14 0607  Weight: 68 kg (149 lb 14.6 oz) 67.45 kg (148 lb 11.2 oz) 67.1 kg (147 lb 14.9 oz)    History of present illness:  [name] is a 81 y.o. female  With past medical history of COPD, systolic heart failure and previous goiter who has been in good state of health when she started developing progressively worsening dyspnea and productive cough with greenish sputum starting 1 day prior to admission. With symptoms progressing, patient decided to come into the emergency room for further evaluation. [name] x-ray was unrevealing. White count was noted to be 15 and BNP elevated at almost 1000. Patient noted to be mildly hypoxic with ambulation on room air at 88%. With 2 L up to 94%. CT scan of the [name] done noted multi lobar pneumonia. Patient given IV antibiotics.  In addition, patient was noted to have a prominent right sided goiter. ER ordered CT scan of the neck and noted some tracheal deviation. This was discussed with ENT, Dr. Janace Hoard, who stated that they will come and evaluate the patient. Hospitalist call for further evaluation and admission  Hospital Course:    Sepsis Patient met sepsis criteria with temperature of 100.8 and a leukocytosis of 15.2. This is likely secondary to  pneumonia. Patient treated with antibiotics, but not IV fluids secondary to CHF. Sepsis physiology resolved.  Multilobar CAP  Presented with shortness of breath, fever and leukocytosis. [name] x-ray consistent with pneumonia, patient is started on antibiotics. Supportive management with bronchodilators, mucolytics, antitussives and oxygen. On discharge Ceftin and azithromycin for 5 more days, patient qualified for oxygen on discharge.   Acute on chronic diastolic heart failure SOB and elevated BNP. 2-D echo showed ejection fraction of 50-55% with grade 2 diastolic dysfunction. Treated with IV Lasix, patient diuresed very well, according to the I/O has >2.4 negative fluid balance since admission. Weight down 147 from 149. Her home dose of Lasix restarted at the time of discharge. Lisinopril restarted.  H/O Cervical Cancer - It appears by chart review she had XRT with good response to therapy - She is to follow up with gyn onc outpatient  PVCs - EKG noted, she has some TWI in II, III, avf and lateral leads, these appear mostly chronic but also some possible ST depressions in lateral leads. TnI are trending up, but only mildly.  - She is being treated for a mild CHF exacerbation and she is acutely ill, which could explain some of these findings.   Elevated troponin Troponin marginal elevated with peak of 0.12, patient did not have any [name] pain. Troponin elevation is likely secondary to acute CHF exacerbation. This is trended down, last troponin was 0.09.   Goiter - Seen on CT scan, known to patient - ENT evaluated and patient was not interested in surgery or further work up  Procedures:  None  Consultations:  None  Discharge Exam: Filed Vitals:   02/17/14 1017  BP: 124/46  Pulse:   Temp:   Resp:    General: Alert and awake, oriented x3, not in any acute distress. HEENT: anicteric sclera, pupils reactive to light and accommodation, EOMI CVS: S1-S2 clear, no  murmur rubs or gallops [name]: clear to auscultation bilaterally, no wheezing, rales or rhonchi Abdomen: soft nontender, nondistended, normal bowel sounds, no organomegaly Extremities: no cyanosis, clubbing or edema noted bilaterally Neuro: Cranial nerves II-XII intact, no focal neurological deficits  Discharge Instructions   Discharge Instructions    Diet - low sodium heart healthy    Complete by:  As directed      Increase activity slowly    Complete by:  As directed           Current Discharge Medication List    START taking these medications   Details  azithromycin (ZITHROMAX) 500 MG tablet Take 1 tablet (500 mg total) by mouth daily. Qty: 5 tablet, Refills: 0    cefUROXime (CEFTIN) 500 MG tablet Take 1 tablet (500 mg total) by mouth 2 (two) times daily with a meal. Qty: 10 tablet, Refills: 0    Guaifenesin (MUCINEX MAXIMUM STRENGTH) 1200 MG TB12 Take 1 tablet (1,200 mg total) by mouth 2 (two) times daily. Qty: 14 each, Refills: 0      CONTINUE these medications which have NOT CHANGED   Details  acetaminophen (TYLENOL) 500 MG tablet Take 1,000 mg by mouth every 6 (six) hours as needed (Pain).    albuterol (PROVENTIL HFA;VENTOLIN HFA) 108 (90 BASE) MCG/ACT inhaler Inhale 2 puffs into the lungs every 6 (six) hours as needed for wheezing.    aspirin 81 MG tablet Take 81 mg by mouth daily.    furosemide (LASIX) 80 MG tablet Take 40 mg by mouth daily.    lisinopril (PRINIVIL,ZESTRIL) 20 MG tablet Take 10 mg by mouth every morning.     polyethylene glycol (MIRALAX / GLYCOLAX) packet Take 17 g by mouth as needed.    polyvinyl alcohol (LIQUIFILM TEARS) 1.4 % ophthalmic solution Place 1 drop into both eyes as needed for dry eyes.    temazepam (RESTORIL) 15 MG capsule     Vitamin D, Ergocalciferol, (DRISDOL) 50000 UNITS CAPS Take 50,000 Units by mouth every 14 (fourteen) days. Sunday      STOP taking these medications     ibuprofen (ADVIL,MOTRIN) 200 MG tablet         Allergies  Allergen Reactions  . Indomethacin Anaphylaxis and Other (See Comments)    "took it fine for awhile; one day I stopped breathing and I ended up in the hospital" (01/02/2012)   Follow-up Information    Follow up with [facility].   Why:  home oxygen   Contact information:   [address] [place] [address] [phone]       Follow up with Jacqueline Downing, MD In 1 week.   Specialty:  Family Medicine   Contact information:   [address] [place] [address] [phone]        The results of significant diagnostics from this hospitalization (including imaging, microbiology, ancillary and laboratory) are listed below for reference.    Significant Diagnostic Studies: Dg [name] 2 View  02/16/2014   CLINICAL DATA:  Shortness of breath for 4 days  EXAM: [name]  2 VIEW  COMPARISON:  [name] CT 02/13/2014, [name] radiograph same date  FINDINGS: Again  noted are patchy diffuse ill-defined airspace opacities, most confluent within the base of the right upper lobe. Trace pleural fluid. Underlying emphysematous change reidentified. Bones are subjectively osteopenic. No acute osseous abnormality. Heart size is normal. Right sided glenohumeral degenerative change is noted. Calcified thyroid nodules are reidentified.  IMPRESSION: Patchy multilobar airspace opacities are reidentified which are nonspecific but may be seen with pneumonia or other alveolar filling processes or ARDS.   Electronically Signed   By: Conchita Paris M.D.   On: 02/16/2014 15:48   Ct Soft Tissue Neck W Contrast  02/13/2014   CLINICAL DATA:  Shortness of breath.  EXAM: CT NECK WITH CONTRAST  TECHNIQUE: Multidetector CT imaging of the neck was performed using the standard protocol following the bolus administration of intravenous contrast.  CONTRAST:  161mL OMNIPAQUE IOHEXOL 350 MG/ML SOLN  COMPARISON:  PET scan 07/14/2013.  FINDINGS: Pharynx and larynx: LEFT-to-RIGHT displacement  air column larynx by large goiter described below. In the or pharyngeal region there is mass effect on the hypopharynx from the RIGHT, also described below. Potentially symptomatic narrowing of the airway in the supraglottic region. No mucosal lesion is evident.  Salivary glands: Unremarkable.  Thyroid: Massive goiter. Heterogeneous appearance with multiple calcifications. Inferiorly the gland is larger on the LEFT, with early BILATERAL substernal extension. As noted from PET scan, there is a hypermetabolic component which extends superiorly from the RIGHT lobe having cross-section 54 x 39 mm. This lesion has not dramatically progressed since July of 2015 but remains concerning for malignant degeneration of goiter. ENT consultation with tissue sampling is warranted.  Lymph nodes: No pathologic adenopathy.  Vascular: Lateral displacement of the carotid sheath on the RIGHT. Minor atheromatous changes bilaterally.  Limited intracranial: Negative.  Visualized orbits: BILATERAL cataract extraction. No acute features.  Mastoids and visualized paranasal sinuses: Moderate sphenoid sinus disease with layering fluid suggesting acuity. No other significant sinus opacity. No mastoid fluid.  Skeleton: For a minor degenerative disc disease. No osseous lesions.  Upper [name]: Reported separately.  IMPRESSION: Massive goiter, with the dominant hypermetabolic (on prior PET) component on the RIGHT potentially compromising the supraglottic airway having cross-sectional measurements of 54 x 39 mm. Concern raised for malignant degeneration of goiter with thyroid carcinoma. ENT consultation with tissue sampling is warranted.   Electronically Signed   By: Rolla Flatten M.D.   On: 02/13/2014 12:31   Ct Angio [name] Pe W/cm &/or Wo Cm  02/13/2014   CLINICAL DATA:  Dyspnea Worsening SOB since yesterday Productive cough [name] pain Hx:Hx Squamous cell ca HTN COPD CHF Hysterectomy  EXAM: CT ANGIOGRAPHY [name] WITH CONTRAST  TECHNIQUE:  Multidetector CT imaging of the [name] was performed using the standard protocol during bolus administration of intravenous contrast. Multiplanar CT image reconstructions and MIPs were obtained to evaluate the vascular anatomy.  CONTRAST:  179mL OMNIPAQUE IOHEXOL 350 MG/ML SOLN  COMPARISON:  07/14/2013 AND EARLIER STUDIES  FINDINGS: The svc is patent. Mild four-chamber cardiac enlargement. Mild reflux from right atrium into central hepatic veins. rv/lv ratio less than 1, normal. dilated central pulmonary arteries. satisfactory opacification of pulmonary arteries noted, and there is no evidence of pulmonary emboli. patient breathing during the acquisition degrades some of the images. patent superior and inferior pulmonary veins bilaterally. moderate coronary calcifications. Adequate contrast opacification of the thoracic aorta with no evidence of dissection, aneurysm, or stenosis. There is bovine variant brachiocephalic arch anatomy without proximal stenosis.  No pleural or pericardial effusion. Subcentimeter prevascular, AP window, and pretracheal lymph nodes, stable. Patchy  airspace opacities in the inferior lingula and anterior left upper lobe, right suprahilar region in the right upper lobe, and in anterior and lateral basal segments right lower lobe, new since prior scan.  Review of the MIP images confirms the above findings. Massively enlarged thyroid measure in at least 11.6 cm transverse diameter with multiple nodules and at the coarse calcifications, incompletely visualized, resulting in mild rightward tracheal deviation without narrowing. Bridging osteophytes across multiple levels in the mid and lower thoracic spine. Advanced degenerative disc disease at multiple contiguous levels in the thoracolumbar junction. Sternum is a intact. Complex 19 mm right renal cyst, incompletely visualized. Bilateral adrenal hyperplasia. Remainder visualized upper abdomen unremarkable.  IMPRESSION: 1. Negative for acute PE or  thoracic aortic dissection. 2. Patchy airspace opacities in the lingula, anterior left upper lobe, right upper and lower lobes, new since prior study, suggesting multifocal pneumonia. Consider follow-up to confirm appropriate resolution. 3. Thyromegaly with multiple nodules, incompletely visualized. 4. Atherosclerosis, including aortic and coronary artery disease. Please note that although the presence of coronary artery calcium documents the presence of coronary artery disease, the severity of this disease and any potential stenosis cannot be assessed on this non-gated CT examination. Assessment for potential risk factor modification, dietary therapy or pharmacologic therapy may be warranted, if clinically indicated.   Electronically Signed   By: Lucrezia Europe M.D.   On: 02/13/2014 12:19   Dg [name] Port 1 View  02/13/2014   CLINICAL DATA:  SOB (ongoing but was worse the past couple of days, had trouble breathing last night), cough, congestion, lower left CP recently as wellHx HTN, CHF, pna, ex-smoker (quit 40 years ago)  EXAM: PORTABLE [name] - 1 VIEW  COMPARISON:  PET-CT 07/14/2013  FINDINGS: Mild perihilar and bibasilar coarse interstitial opacities, without convincing change from previous study. Heart size upper limits normal. No effusion. Spurring in the mid and lower thoracic spine. Advanced degenerative changes in the right shoulder.  IMPRESSION: 1. Stable mild cardiomegaly and chronic interstitial changes. No acute disease.   Electronically Signed   By: Lucrezia Europe M.D.   On: 02/13/2014 09:21    Microbiology: Recent Results (from the past 240 hour(s))  Culture, blood (routine x 2) Call MD if unable to obtain prior to antibiotics being given     Status: None (Preliminary result)   Collection Time: 02/13/14  6:15 PM  Result Value Ref Range Status   Specimen Description BLOOD RIGHT ARM  Final   Special Requests BOTTLES DRAWN AEROBIC AND ANAEROBIC 5CC  Final   Culture   Final           BLOOD CULTURE  RECEIVED NO GROWTH TO DATE CULTURE WILL BE HELD FOR 5 DAYS BEFORE ISSUING A FINAL NEGATIVE REPORT Performed at [facility]    Report Status PENDING  Incomplete  Culture, blood (routine x 2) Call MD if unable to obtain prior to antibiotics being given     Status: None (Preliminary result)   Collection Time: 02/13/14  6:30 PM  Result Value Ref Range Status   Specimen Description BLOOD RIGHT HAND  Final   Special Requests BOTTLES DRAWN AEROBIC AND ANAEROBIC 5CC  Final   Culture   Final           BLOOD CULTURE RECEIVED NO GROWTH TO DATE CULTURE WILL BE HELD FOR 5 DAYS BEFORE ISSUING A FINAL NEGATIVE REPORT Performed at [facility]    Report Status PENDING  Incomplete     Labs: Basic Metabolic Panel:  Recent Labs Lab 02/13/14 0838 02/13/14 1815 02/14/14 0502 02/15/14 0313 02/16/14 0700  NA 137  --  137 135 137  K 3.8  --  3.6 3.3* 3.4*  CL 100  --  102 102 100  CO2 27  --  $R'26 28 25  'iQ$ GLUCOSE 131*  --  109* 123* 127*  BUN 13  --  $R'15 19 15  'Vd$ CREATININE 0.83 1.00 0.98 1.04 0.95  CALCIUM 8.7  --  8.6 7.9* 8.4   Liver Function Tests: No results for input(s): AST, ALT, ALKPHOS, BILITOT, PROT, ALBUMIN in the last 168 hours. No results for input(s): LIPASE, AMYLASE in the last 168 hours. No results for input(s): AMMONIA in the last 168 hours. CBC:  Recent Labs Lab 02/13/14 0838 02/13/14 1815 02/14/14 0502  WBC 15.2* 14.6* 14.1*  HGB 12.9 11.5* 10.7*  HCT 37.1 35.0* 32.6*  MCV 89.6 92.6 92.4  PLT 236 236 211   Cardiac Enzymes:  Recent Labs Lab 02/13/14 0838 02/14/14 1520 02/14/14 2156 02/15/14 0313  TROPONINI 0.07* 0.12* 0.08* 0.09*   BNP: BNP (last 3 results)  Recent Labs  02/13/14 0838 02/16/14 0700  BNP 938.8* 381.7*    ProBNP (last 3 results) No results for input(s): PROBNP in the last 8760 hours.  CBG: No results for input(s): GLUCAP in the last 168 hours.     Signed:  Ashby Moskal A  [facility] 02/17/2014, 1:02  PM

## 2014-02-20 LAB — CULTURE, BLOOD (ROUTINE X 2)
Culture: NO GROWTH
Culture: NO GROWTH

## 2014-05-13 ENCOUNTER — Encounter: Payer: Self-pay | Admitting: Cardiovascular Disease

## 2014-05-13 ENCOUNTER — Ambulatory Visit (INDEPENDENT_AMBULATORY_CARE_PROVIDER_SITE_OTHER): Payer: Medicare Other | Admitting: Cardiovascular Disease

## 2014-05-13 VITALS — BP 163/81 | HR 75 | Ht 63.0 in | Wt 152.4 lb

## 2014-05-13 DIAGNOSIS — I5033 Acute on chronic diastolic (congestive) heart failure: Secondary | ICD-10-CM

## 2014-05-13 DIAGNOSIS — I10 Essential (primary) hypertension: Secondary | ICD-10-CM

## 2014-05-13 NOTE — Patient Instructions (Signed)
Medication Instructions:  Your physician recommends that you continue on your current medications as directed. Please refer to the Current Medication list given to you today.   Labwork: None Ordered   Testing/Procedures: None Ordered   Follow-Up: Your physician wants you to follow-up in: 1 year with Dr. Nahser.  You will receive a reminder letter in the mail two months in advance. If you don't receive a letter, please call our office to schedule the follow-up appointment.      

## 2014-05-13 NOTE — Progress Notes (Signed)
Cardiology Office Note   Date:  05/13/2014   ID:  [name], DOB [birth year], MRN [id number]  PCP:  Leonard Downing, MD  Cardiologist:   Thayer Headings, MD   Chief Complaint  Patient presents with  . Follow-up   1. HTN 2. CHF 3. Hypothyroidism 4. diverticulitis  History of Present Illness:  [name] is an 81 yo with hx diverticulitis with diverticulosis in April. She had a perforated diverticulum and became septic. She had emergent surgery with a colostomy placement. She now scheduled to have reversal of her colostomy and the surgeons are requesting cardiac clearance. When she was hospitalized she had an echocardiogram that revealed mildly depressed left ventricular systolic function with an ejection fraction of 40-45%. She did have some diastolic dysfunction.  Left ventricle: The cavity size was moderately dilated. Wall thickness was normal. Systolic function was mildly to moderately reduced. The estimated ejection fraction was in the range of 40% to 45%. There was an increased relative contribution of atrial contraction to ventricular filling. - Left atrium: The atrium was mildly dilated  He has not had any cardiac problems since I last saw her 7 years ago. She does all of her housework without any significant problems. She's able to calm 1 flight of stairs without difficulty. She thinks that she would become short of breath if I asked her to climb 2 flights of stairs.  April, 2015:  She is doing well. She tolerated her colostomy reversal last year without problems. Able to do all of her normal activities without any chest pain or shortness breath. She still able to go up steps although she comments that she climbs slowly.   [name] 12, 2016  [name] is a 81 y.o. female who presents for follow-up of her mild congestive heart failure. She was diagnosed with squamous cell carcinoma of unknown primary. She was hospitalized in Feb, 2016 with pneumonia    Breathing is ok.  A little short of breath  Her BP readings at physical therapy are all in the normal range. She did not take her meds this am and the BP is a bit high  Past Medical History  Diagnosis Date  . Hypertension   . COPD (chronic obstructive pulmonary disease)   . CHF (congestive heart failure)   . SOB (shortness of breath)     'all the time" (01/02/2012)  . Arthritis     "right shoulder" (01/02/2012)  . Pneumonia 01/02/2012; ? 2009  . GERD (gastroesophageal reflux disease)     occasional  . Anemia     years ago  . Diverticulitis of colon with perforation 01/14/2012    That is post sigmoid resection with [facility] pouch and end colostomy   . CAP (community acquired pneumonia) 01/02/2012    Lower lobe   . Intra-abdominal abscess 01/14/2012  . Pneumatosis of intestines 01/14/2012  . UTI (lower urinary tract infection) 01/03/2012  . Bladder mass   . History of kidney stones     X 1  . Difficulty sleeping   . Goiter     radioactive iodine ablation/notes 01/02/2012  . Hypothyroidism     "I have taken Synthroid before" (01/02/2012)   HAS HAD RADIOACTIVE IODINE TX 2 YR S AGO  . Radiation 08/10/13-09/15/13    55 fray to vaginal/bladder mass    Past Surgical History  Procedure Laterality Date  . Cataract extraction w/ intraocular lens  implant, bilateral  ?1990's    Bil  . Appendectomy  1980's    "  when they did hysterectomy" (01/02/2012)  . Abdominal hysterectomy  1980's  . Colostomy revision N/A 03/12/2012    Procedure: COLON RESECTION SIGMOID ;  Surgeon: Ralene Ok, MD;  Location: [place];  Service: General;  Laterality: N/A;  . Colostomy N/A 03/12/2012    Procedure: COLOSTOMY;  Surgeon: Ralene Ok, MD;  Location: [place];  Service: General;  Laterality: N/A;  . Colostomy takedown N/A 10/21/2012    Procedure: LAPAROSCOPIC COLOSTOMY TAKEDOWN AND HARTMANS ANASTOMOSIS, rigid proctoscopy;  Surgeon: Ralene Ok, MD;  Location: [facility];  Service: General;  Laterality: N/A;  .  Lysis of adhesion N/A 10/21/2012    Procedure: LYSIS OF ADHESION;  Surgeon: Ralene Ok, MD;  Location: [facility];  Service: General;  Laterality: N/A;  . Transurethral resection of bladder tumor with gyrus (turbt-gyrus) N/A 06/30/2013    Procedure: TRANSURETHRAL RESECTION OF BLADDER TUMOR WITH GYRUS (TURBT-GYRUS)/VAGINAL BIOPSY;  Surgeon: Ardis Hughs, MD;  Location: [facility];  Service: Urology;  Laterality: N/A;     Current Outpatient Prescriptions  Medication Sig Dispense Refill  . acetaminophen (TYLENOL) 500 MG tablet Take 1,000 mg by mouth every 6 (six) hours as needed (Pain).    Marland Kitchen albuterol (PROVENTIL HFA;VENTOLIN HFA) 108 (90 BASE) MCG/ACT inhaler Inhale 2 puffs into the lungs every 6 (six) hours as needed for wheezing.    Marland Kitchen aspirin 81 MG tablet Take 81 mg by mouth daily.    Marland Kitchen azithromycin (ZITHROMAX) 500 MG tablet Take 1 tablet (500 mg total) by mouth daily. 5 tablet 0  . furosemide (LASIX) 80 MG tablet Take 40 mg by mouth daily.    . Guaifenesin (MUCINEX MAXIMUM STRENGTH) 1200 MG TB12 Take 1 tablet (1,200 mg total) by mouth 2 (two) times daily. 14 each 0  . lisinopril (PRINIVIL,ZESTRIL) 20 MG tablet Take 10 mg by mouth every morning.     . polyethylene glycol (MIRALAX / GLYCOLAX) packet Take 17 g by mouth as needed.    . polyvinyl alcohol (LIQUIFILM TEARS) 1.4 % ophthalmic solution Place 1 drop into both eyes as needed for dry eyes.    Marland Kitchen temazepam (RESTORIL) 15 MG capsule     . Vitamin D, Ergocalciferol, (DRISDOL) 50000 UNITS CAPS Take 50,000 Units by mouth every 14 (fourteen) days. Sunday     No current facility-administered medications for this visit.    Allergies:   Indomethacin    Social History:  The patient  reports that she quit smoking about 23 years ago. Her smoking use included Cigarettes. She has a 4.2 pack-year smoking history. She has never used smokeless tobacco. She reports that she does not drink alcohol or use illicit drugs.   Family History:  The patient's  family history includes Cancer in her sister; Heart disease in her mother and another family member.    ROS:  Please see the history of present illness.    Review of Systems: Constitutional:  denies fever, chills, diaphoresis, appetite change and fatigue.  HEENT: denies photophobia, eye pain, redness, hearing loss, ear pain, congestion, sore throat, rhinorrhea, sneezing, neck pain, neck stiffness and tinnitus.  Respiratory: denies SOB, DOE, cough, chest tightness, and wheezing.  Cardiovascular: denies chest pain, palpitations and leg swelling.  Gastrointestinal: denies nausea, vomiting, abdominal pain, diarrhea, constipation, blood in stool.  Genitourinary: denies dysuria, urgency, frequency, hematuria, flank pain and difficulty urinating.  Musculoskeletal: denies  myalgias, back pain, joint swelling, arthralgias and gait problem.   Skin: denies pallor, rash and wound.  Neurological: denies dizziness, seizures, syncope, weakness, light-headedness, numbness  and headaches.   Hematological: denies adenopathy, easy bruising, personal or family bleeding history.  Psychiatric/ Behavioral: denies suicidal ideation, mood changes, confusion, nervousness, sleep disturbance and agitation.       All other systems are reviewed and negative.    PHYSICAL EXAM: VS:  BP 163/81 mmHg  Pulse 75  Ht 5\' 3"  (1.6 m)  Wt 69.128 kg (152 lb 6.4 oz)  BMI 27.00 kg/m2  SpO2 95% , BMI Body mass index is 27 kg/(m^2). GEN: Well nourished, well developed, in no acute distress HEENT: normal Neck: no JVD, carotid bruits, or masses Cardiac: RRR; no murmurs, rubs, or gallops,no edema  Respiratory:  clear to auscultation bilaterally, normal work of breathing GI: soft, nontender, nondistended, + BS MS: no deformity or atrophy Skin: warm and dry, no rash Neuro:  Strength and sensation are intact Psych: normal   EKG:  EKG is not ordered today.    Recent Labs: 08/14/2013: ALT 7 02/13/2014: TSH 0.521 02/14/2014:  Hemoglobin 10.7*; Platelets 211 02/16/2014: B Natriuretic Peptide 381.7*; BUN 15; Creatinine 0.95; Potassium 3.4*; Sodium 137    Lipid Panel    Component Value Date/Time   CHOL 169 04/01/2013 1143   TRIG 150.0* 04/01/2013 1143   HDL 34.90* 04/01/2013 1143   CHOLHDL 5 04/01/2013 1143   VLDL 30.0 04/01/2013 1143   LDLCALC 104* 04/01/2013 1143      Wt Readings from Last 3 Encounters:  05/13/14 69.128 kg (152 lb 6.4 oz)  02/17/14 67.1 kg (147 lb 14.9 oz)  09/11/13 65 kg (143 lb 4.8 oz)      Other studies Reviewed: Additional studies/ records that were reviewed today include: . Review of the above records demonstrates:    ASSESSMENT AND PLAN:  1. HTN - BP is well controlled.  2. CHF 3. Hypothyroidism 4. diverticulitis   Current medicines are reviewed at length with the patient today.  The patient does not have concerns regarding medicines.  The following changes have been made:  no change  Labs/ tests ordered today include:  No orders of the defined types were placed in this encounter.     Disposition:   FU with me in 1 year      [name], Wonda Cheng, MD  05/13/2014 4:52 PM    [facility] [place], [place], [place]  [address] Phone: [phone]; Fax: [phone]   [facility]  [address] [place], [place]  [address] [phone]    Fax [phone]

## 2014-07-07 ENCOUNTER — Encounter: Payer: Self-pay | Admitting: Oncology

## 2014-07-08 ENCOUNTER — Other Ambulatory Visit (HOSPITAL_COMMUNITY)
Admission: RE | Admit: 2014-07-08 | Discharge: 2014-07-08 | Disposition: A | Discharge status: Home / Self Care | Payer: Medicare Other | Source: Ambulatory Visit | Attending: Radiation Oncology | Admitting: Radiation Oncology

## 2014-07-08 ENCOUNTER — Ambulatory Visit
Admission: RE | Admit: 2014-07-08 | Discharge: 2014-07-08 | Disposition: A | Discharge status: Home / Self Care | Payer: Medicare Other | Source: Ambulatory Visit | Attending: Radiation Oncology | Admitting: Radiation Oncology

## 2014-07-08 ENCOUNTER — Encounter: Payer: Self-pay | Admitting: Radiation Oncology

## 2014-07-08 VITALS — BP 166/71 | HR 66 | Temp 98.1°F | Resp 24 | Ht 63.0 in | Wt 153.8 lb

## 2014-07-08 DIAGNOSIS — C4492 Squamous cell carcinoma of skin, unspecified: Secondary | ICD-10-CM

## 2014-07-08 DIAGNOSIS — Z01411 Encounter for gynecological examination (general) (routine) with abnormal findings: Secondary | ICD-10-CM | POA: Insufficient documentation

## 2014-07-08 NOTE — Progress Notes (Signed)
Orvis Brill here for follow up using a walker.  She denies pain.  She reports she is "congested today."  She said she uses 2 L of oxygen at home but has not brought it with her today.  Her oxygen saturation on room air is 94%.  She denies having bladder issues, vaginal bleeding/discharge and nausea. She reports she has occasional constipation and takes milk of magnesia as needed.  She had a bowel movement yesterday.  She reports fatigue with activity.  She reports she is using her vaginal dilator.  BP 166/71 mmHg  Pulse 66  Temp(Src) 98.1 F (36.7 C) (Oral)  Resp 24  Ht 5\' 3"  (1.6 m)  Wt 153 lb 12.8 oz (69.763 kg)  BMI 27.25 kg/m2  SpO2 94%

## 2014-07-08 NOTE — Progress Notes (Signed)
Radiation Oncology         ([phone]) [phone] ________________________________  Name: [name] MRN: [id number]  Date: 07/08/2014  DOB: 1925/05/31  Follow-Up Visit Note  CC: Leonard Downing, MD  Everitt Amber, MD  No diagnosis found.  Diagnosis:    Squamous cell carcinoma of unknown origin presenting in the bladder and upper vaginal area  Interval Since Last Radiation:  10  months  Narrative:  The patient returns today for routine follow-up. She is using a walker. She denies pain and hemoptysis. She reports she is "congested today." She said she uses 2 L of oxygen at home but has not brought it with her today. Her oxygen saturation on room air is 94%. She denies having bladder issues, vaginal bleeding/discharge and nausea. She reports she has occasional constipation and takes milk of magnesia as needed. She had a bowel movement yesterday. She reports fatigue with activity. She reports she is using her vaginal dilator. She was in the hospital recently due to breathing problems. She has had no pain in the pelvis area and no bleeding or vaginal discharge. She reports still having a hernia though it does not cause her any problems.                         ALLERGIES:  is allergic to indomethacin.  Meds: Current Outpatient Prescriptions  Medication Sig Dispense Refill  . acetaminophen (TYLENOL) 500 MG tablet Take 1,000 mg by mouth every 6 (six) hours as needed (Pain).    Marland Kitchen albuterol (PROVENTIL HFA;VENTOLIN HFA) 108 (90 BASE) MCG/ACT inhaler Inhale 2 puffs into the lungs every 6 (six) hours as needed for wheezing.    Marland Kitchen aspirin 81 MG tablet Take 81 mg by mouth daily.    Marland Kitchen azithromycin (ZITHROMAX) 500 MG tablet Take 1 tablet (500 mg total) by mouth daily. 5 tablet 0  . furosemide (LASIX) 80 MG tablet Take 40 mg by mouth daily.    . Guaifenesin (MUCINEX MAXIMUM STRENGTH) 1200 MG TB12 Take 1 tablet (1,200 mg total) by mouth 2 (two) times daily. 14 each 0  . lisinopril (PRINIVIL,ZESTRIL) 20 MG  tablet Take 10 mg by mouth every morning.     . polyethylene glycol (MIRALAX / GLYCOLAX) packet Take 17 g by mouth as needed.    . polyvinyl alcohol (LIQUIFILM TEARS) 1.4 % ophthalmic solution Place 1 drop into both eyes as needed for dry eyes.    Marland Kitchen temazepam (RESTORIL) 15 MG capsule     . Vitamin D, Ergocalciferol, (DRISDOL) 50000 UNITS CAPS Take 50,000 Units by mouth every 14 (fourteen) days. Sunday     No current facility-administered medications for this encounter.    Physical Findings: The patient is in no acute distress. Patient is alert and oriented.  vitals were not taken for this visit.Marland Kitchen  No palpable supraclavicular or axillary adenopathy. The lungs are clear to auscultation. The heart has a regular rhythm and rate. The abdomen is soft and nontender with normal bowel sounds.  The patient has a easily reducible hernia just left of the umbilicus. No inguinal adenopathy appreciated. On pelvic examination the external genitalia are unremarkable. Speculum exam is performed. There are no mucosal lesions noted within the vaginal vault. On bimanual and rectovaginal examination I am unable to palpate any residual mass along the anterior vaginal wall/anterior pelvis region.   Lab Findings: Lab Results  Component Value Date   WBC 14.1* 02/14/2014   HGB 10.7* 02/14/2014   HCT 32.6*  02/14/2014   MCV 92.4 02/14/2014   PLT 211 02/14/2014    Radiographic Findings: No results found.  Impression:  No evidence of recurrence on physical exam today, pap smear pending.  Plan:  Routine follow up with radiation oncology in 6 months. In interim patient will be seen by gynecological oncology.  This document serves as a record of services personally performed by Gery Pray, MD. It was created on his behalf by Arlyce Harman, a trained medical scribe. The creation of this record is based on the scribe's personal observations and the provider's statements to them. This document has been checked and  approved by the attending provider. -----------------------------------  Blair Promise, PhD, MD

## 2014-07-09 LAB — CYTOLOGY - PAP

## 2014-07-13 ENCOUNTER — Telehealth: Payer: Self-pay | Admitting: Oncology

## 2014-07-13 NOTE — Telephone Encounter (Signed)
Left a message for [name] notifying her of her good pap smear results per Dr. Sondra Come.  Requested a return call.

## 2014-07-19 ENCOUNTER — Telehealth: Payer: Self-pay | Admitting: Oncology

## 2014-07-19 NOTE — Telephone Encounter (Signed)
Called [name], [name]'s daughter, to check on [name] because she has not called back after multiple messages.  [name] said [name] was in [place] and that she would let her know the good pap smear results.

## 2014-08-26 ENCOUNTER — Encounter: Payer: Self-pay | Admitting: Oncology

## 2014-08-26 ENCOUNTER — Telehealth: Payer: Self-pay | Admitting: Oncology

## 2014-08-26 ENCOUNTER — Telehealth: Payer: Self-pay | Admitting: Hematology

## 2014-08-26 ENCOUNTER — Other Ambulatory Visit: Payer: Self-pay | Admitting: Oncology

## 2014-08-26 DIAGNOSIS — D649 Anemia, unspecified: Secondary | ICD-10-CM

## 2014-08-26 NOTE — Telephone Encounter (Signed)
Left message to confirm appointment for 09/01

## 2014-08-26 NOTE — Progress Notes (Unsigned)
Medical Oncology  Request received from Dr Claris Gower office for evaluation of low hemoglobin. I had seen patient last 09-2013 during radiation for cervical cancer (not candidate for surgery or sensitizing chemotherapy). No labs received from PCP, will have HIM request these.  POF and orders done.  Godfrey Pick, MD

## 2014-08-29 ENCOUNTER — Other Ambulatory Visit: Payer: Self-pay | Admitting: Oncology

## 2014-09-02 ENCOUNTER — Ambulatory Visit (HOSPITAL_BASED_OUTPATIENT_CLINIC_OR_DEPARTMENT_OTHER): Payer: Medicare Other | Admitting: Oncology

## 2014-09-02 ENCOUNTER — Other Ambulatory Visit (HOSPITAL_BASED_OUTPATIENT_CLINIC_OR_DEPARTMENT_OTHER): Payer: Medicare Other

## 2014-09-02 ENCOUNTER — Encounter: Payer: Self-pay | Admitting: Oncology

## 2014-09-02 VITALS — BP 158/64 | HR 64 | Temp 97.9°F | Resp 18 | Ht 63.0 in | Wt 155.8 lb

## 2014-09-02 DIAGNOSIS — D649 Anemia, unspecified: Secondary | ICD-10-CM

## 2014-09-02 DIAGNOSIS — D500 Iron deficiency anemia secondary to blood loss (chronic): Secondary | ICD-10-CM

## 2014-09-02 DIAGNOSIS — C7982 Secondary malignant neoplasm of genital organs: Secondary | ICD-10-CM

## 2014-09-02 DIAGNOSIS — C539 Malignant neoplasm of cervix uteri, unspecified: Secondary | ICD-10-CM

## 2014-09-02 DIAGNOSIS — C775 Secondary and unspecified malignant neoplasm of intrapelvic lymph nodes: Secondary | ICD-10-CM

## 2014-09-02 DIAGNOSIS — N289 Disorder of kidney and ureter, unspecified: Secondary | ICD-10-CM

## 2014-09-02 DIAGNOSIS — C7911 Secondary malignant neoplasm of bladder: Secondary | ICD-10-CM

## 2014-09-02 LAB — CBC WITH DIFFERENTIAL/PLATELET
BASO%: 1 % (ref 0.0–2.0)
BASOS ABS: 0.1 10*3/uL (ref 0.0–0.1)
EOS ABS: 0.3 10*3/uL (ref 0.0–0.5)
EOS%: 4.8 % (ref 0.0–7.0)
HCT: 34.9 % (ref 34.8–46.6)
HEMOGLOBIN: 11.7 g/dL (ref 11.6–15.9)
LYMPH%: 16.1 % (ref 14.0–49.7)
MCH: 31.6 pg (ref 25.1–34.0)
MCHC: 33.5 g/dL (ref 31.5–36.0)
MCV: 94.3 fL (ref 79.5–101.0)
MONO#: 0.5 10*3/uL (ref 0.1–0.9)
MONO%: 8.9 % (ref 0.0–14.0)
NEUT%: 69.2 % (ref 38.4–76.8)
NEUTROS ABS: 4 10*3/uL (ref 1.5–6.5)
Platelets: 256 10*3/uL (ref 145–400)
RBC: 3.7 10*6/uL (ref 3.70–5.45)
RDW: 12.3 % (ref 11.2–14.5)
WBC: 5.8 10*3/uL (ref 3.9–10.3)
lymph#: 0.9 10*3/uL (ref 0.9–3.3)

## 2014-09-02 LAB — COMPREHENSIVE METABOLIC PANEL (CC13)
ALT: 10 U/L (ref 0–55)
ANION GAP: 8 meq/L (ref 3–11)
AST: 15 U/L (ref 5–34)
Albumin: 3.9 g/dL (ref 3.5–5.0)
Alkaline Phosphatase: 86 U/L (ref 40–150)
BILIRUBIN TOTAL: 0.52 mg/dL (ref 0.20–1.20)
BUN: 21.8 mg/dL (ref 7.0–26.0)
CALCIUM: 9.4 mg/dL (ref 8.4–10.4)
CO2: 29 mEq/L (ref 22–29)
Chloride: 106 mEq/L (ref 98–109)
Creatinine: 1.1 mg/dL (ref 0.6–1.1)
EGFR: 55 mL/min/{1.73_m2} — AB (ref >90)
Glucose: 95 mg/dl (ref 70–140)
Potassium: 4.2 mEq/L (ref 3.5–5.1)
Sodium: 144 mEq/L (ref 136–145)
TOTAL PROTEIN: 7.2 g/dL (ref 6.4–8.3)

## 2014-09-02 LAB — FERRITIN CHCC: FERRITIN: 62 ng/mL (ref 9–269)

## 2014-09-02 LAB — MORPHOLOGY
PLT EST: ADEQUATE
RBC COMMENTS: NORMAL

## 2014-09-02 LAB — IRON AND TIBC CHCC
%SAT: 30 % (ref 21–57)
IRON: 74 ug/dL (ref 41–142)
TIBC: 245 ug/dL (ref 236–444)
UIBC: 171 ug/dL (ref 120–384)

## 2014-09-02 NOTE — Progress Notes (Signed)
Checked in pt. Pt signed AOB> Pt made copay of $35 via [place]. Receipt given.

## 2014-09-02 NOTE — Progress Notes (Signed)
OFFICE PROGRESS NOTE   September 02, 2014   Physicians:James Kinard, Everitt Amber, Elder Negus Rameriz, Farris Has, Jeanann Lewandowsky  INTERVAL HISTORY:  Patient is seen, together with daughter, at request of Dr W.Elkins because of anemia.  I had met her a year ago as she was being treated for squamous cell cancer involving bladder and vagina, of possibly cervical origin. Treatment was with radiation alone, as performance status and comorbidities did not allow sensitizing chemotherapy with this.  She received radiation 55 Gy from 8-10 thru 09-15-13. She saw Dr Sondra Come last in 07-2014; she has not had recent visit with gyn oncology. She is clinically doing well from standpoint of the cervical cancer.   In 08-2013 with diagnosis metastatic squamous cell cancer, CBC had WBC 14.8, Hgb 11 and plt 324k, with MCV 89.9. CMET in 08-2013 had Tprot 6.9 and alb 3.1 with creatinine 1.2.  In 09-2013, near completion of radiation, she had WBC 4.2, Hgb 10.9 and plt 259k and chemistries with creatinine 0.8  Patient was hospitalized in Feb 2016 with pneumonia and acute on chronic diastolic heart failure. Hemoglobin in hospital was 12.9 - 10.7, with MCV 89-92, WBC elevated with pneumonia and platelets normal at ~ 200k. Most recent labs from Dr Arelia Sneddon 08-11-14 had Hgb 10.4.  Additional labs: creatinine in 02-2014 1.04 - 0.95 with EGFR 55-61. HIV negative, TSH normal  And urinalysis 0-2 RBCs in 02-2014.  Imaging studies back to 12-2013 do not show bone lesions suggesting myeloma.  Patient denies any overt bleeding. She and family member report that she is feeling fairly well, with good appetite and no abdominal or pelvic pain. She denies SOB at rest or with usual exertion, and denies chest pain; note she has been on home O2 since hospitalization in 02-2014 which she does use most of the time. She has had no recent infectious illness. Bowels are moving regularly and bladder function is at  baseline.    ONCOLOGIC HISTORY Patient developed dysuria and pelvic pain ~ Jan 2015, with CT AP 03-30-13 in [facility] system showing abnormality at vaginal cuff / bladder base. Pelvic US 04-13-13  showed masslike appearance of probable cervical remnant. MRI pelvis 05-21-13 identified 2.7 x 4 x 3.8 cm mass at vaginal cuff, possibly invading posterior wall of bladder, with no adenopathy or significant fluid collection. Cystoscopy 06-30-13 found 3 cm bladder mass near trigone, and exam under anesthesia also showed a nodular firm mass at anterior vaginal wall; biopsy of the bladder showed invasive poorly differentiated carcinoma suggestive of squamous carcinoma, and biopsy of vaginal mass also poorly differentiated carcinoma. PET 07-14-13 had 5 cm hypermetabolic mass involving vaginal cuff and posterior bladder base and hypermetabolic internal iliac nodes bilaterally, without evidence of more distant metastatic involvement. She was seen by Dr Denman George on 07-24-13, with finding of bladder with palpable mass centrally from mid vagina to vaginal apex, minimally mobile. Patient was not surgical candidate for what would require anterior exenteration and urinary diversion due to tumor locally extensive and metastatic on PET to pelvic nodes such the procedure would not be curative, and fact that she was poor surgical candidate due to age, prior surgeries and comorbidities. Dr Denman George thought possible that the cancer arose from the deep margin of a small cervical remnant, given completely normal vaginal mucosa at her exam. Radiation therapy by Dr Sondra Come 55 gray in 25 fractions from 8-10 thru 09-15-13  ; comorbidities and performance status did not allow sensitizing CDDP  Review of systems as above, also: No LE swelling. No fever. No N/V. Portable O2 too cumbersome to bring to appointments "I do all right without it for a while". Remainder of 10 point Review of Systems negative.  Objective:  Vital signs in last 24 hours:  BP  158/64 mmHg  Pulse 64  Temp(Src) 97.9 F (36.6 C) (Oral)  Resp 18  Ht 5' 3" (1.6 m)  Wt 155 lb 12.8 oz (70.67 kg)  BMI 27.61 kg/m2  SpO2 96% Weight up 8.5 lbs from 02-2014. Elderly lady, very pleasant, respirations not labored RA now, looks comfortable. Alert, oriented and appropriate. Ambulatory with some assistance.  No alopecia  HEENT:PERRL, sclerae not icteric. Oral mucosa moist without lesions, posterior pharynx clear.  Neck supple. No JVD.  Lymphatics:no cervical,supraclavicular, axillary adenopathy Resp: clear to auscultation bilaterally and normal percussion bilaterally Cardio: regular rate and rhythm. No gallop. GI: soft, nontender, not distended, no mass or organomegaly. Normally active bowel sounds.  Musculoskeletal/ Extremities: without pitting edema, cords, tenderness Neuro: speech fluent, nonfocal. PSYCH appropriate mood and affect Skin without rash, ecchymosis, petechiae  Lab Results:  Results for orders placed or performed in visit on 09/02/14  CBC with Differential  Result Value Ref Range   WBC 5.8 3.9 - 10.3 10e3/uL   NEUT# 4.0 1.5 - 6.5 10e3/uL   HGB 11.7 11.6 - 15.9 g/dL   HCT 34.9 34.8 - 46.6 %   Platelets 256 145 - 400 10e3/uL   MCV 94.3 79.5 - 101.0 fL   MCH 31.6 25.1 - 34.0 pg   MCHC 33.5 31.5 - 36.0 g/dL   RBC 3.70 3.70 - 5.45 10e6/uL   RDW 12.3 11.2 - 14.5 %   lymph# 0.9 0.9 - 3.3 10e3/uL   MONO# 0.5 0.1 - 0.9 10e3/uL   Eosinophils Absolute 0.3 0.0 - 0.5 10e3/uL   Basophils Absolute 0.1 0.0 - 0.1 10e3/uL   NEUT% 69.2 38.4 - 76.8 %   LYMPH% 16.1 14.0 - 49.7 %   MONO% 8.9 0.0 - 14.0 %   EOS% 4.8 0.0 - 7.0 %   BASO% 1.0 0.0 - 2.0 %  Morphology  Result Value Ref Range   RBC Comments Within Normal Limits Within Normal Limits   White Cell Comments C/W auto diff    PLT EST Adequate Adequate  Comprehensive metabolic panel (Cmet) - [facility]  Result Value Ref Range   Sodium 144 136 - 145 mEq/L   Potassium 4.2 3.5 - 5.1 mEq/L   Chloride 106 98 - 109  mEq/L   CO2 29 22 - 29 mEq/L   Glucose 95 70 - 140 mg/dl   BUN 21.8 7.0 - 26.0 mg/dL   Creatinine 1.1 0.6 - 1.1 mg/dL   Total Bilirubin 0.52 0.20 - 1.20 mg/dL   Alkaline Phosphatase 86 40 - 150 U/L   AST 15 5 - 34 U/L   ALT 10 0 - 55 U/L   Total Protein 7.2 6.4 - 8.3 g/dL   Albumin 3.9 3.5 - 5.0 g/dL   Calcium 9.4 8.4 - 10.4 mg/dL   Anion Gap 8 3 - 11 mEq/L   EGFR 55 (L) >90 ml/min/1.73 m2    LABS resulting after visit: B12 normal at 412 Serum iron 74, %sat 30 Ferritin 62 Erythropoietin pending  Studies/Results:  No results found.  Medications: I have reviewed the patient's current medications.  DISCUSSION: at time mildly anemic, but blood counts today are good, in normal range. Previous variability likely related to other comorbid conditions  at the time, including volume status, intermittent worsening of renal status, radiation etc. No further evaluation or intervention needed at present.  Clinically also doing well from standpoint of the cervical cancer. She is to see Dr Denman George 10-08-14 and Dr Sondra Come again in 01-2015. These appointments were given to patient and family member again today. I will see her back on as needed basis.  Assessment/Plan: 1.Anemia documented at times in past year, with blood counts all in normal range today. Likely secondary to interim comorbid problems. 2.metastatic squamous cell carcinoma, possibly from cervical remnant, involving vaginal cuff, bladder trigone and bilateral internal iliac nodes. Clinically doing well following definitive radiation completed 09-2013. 3.cardiac disease with CHF, HTN 4.pneumonia 02-2014, resolved. COPD, past tobacco, now using home O2 regularly 5. Post hysterectomy 1980s 6.chronic renal disease   All questions answered. Time spent 25 min including >50% counseling and coordination of care. Cc Drs Arelia Sneddon and Valentino Nose, MD   09/02/2014, 2:10 PM

## 2014-09-03 ENCOUNTER — Telehealth: Payer: Self-pay | Admitting: Oncology

## 2014-09-03 NOTE — Telephone Encounter (Signed)
10/16 and 1/17 appointment/calendar mailed to patient

## 2014-09-04 DIAGNOSIS — C539 Malignant neoplasm of cervix uteri, unspecified: Secondary | ICD-10-CM | POA: Insufficient documentation

## 2014-09-04 DIAGNOSIS — D649 Anemia, unspecified: Secondary | ICD-10-CM | POA: Insufficient documentation

## 2014-09-07 LAB — ERYTHROPOIETIN: Erythropoietin: 20 m[IU]/mL — ABNORMAL HIGH (ref 2.6–18.5)

## 2014-09-07 LAB — VITAMIN B12: Vitamin B-12: 412 pg/mL (ref 211–911)

## 2014-09-20 ENCOUNTER — Ambulatory Visit: Payer: Medicare Other | Admitting: Gynecologic Oncology

## 2014-10-08 ENCOUNTER — Ambulatory Visit: Payer: Medicare Other | Attending: Gynecologic Oncology | Admitting: Gynecologic Oncology

## 2014-10-08 ENCOUNTER — Encounter: Payer: Self-pay | Admitting: Gynecologic Oncology

## 2014-10-08 VITALS — BP 155/63 | HR 70 | Temp 98.1°F | Resp 18 | Ht 63.0 in | Wt 156.8 lb

## 2014-10-08 DIAGNOSIS — C4492 Squamous cell carcinoma of skin, unspecified: Secondary | ICD-10-CM

## 2014-10-08 DIAGNOSIS — Z859 Personal history of malignant neoplasm, unspecified: Secondary | ICD-10-CM

## 2014-10-08 DIAGNOSIS — Z923 Personal history of irradiation: Secondary | ICD-10-CM

## 2014-10-08 DIAGNOSIS — C801 Malignant (primary) neoplasm, unspecified: Secondary | ICD-10-CM | POA: Diagnosis present

## 2014-10-08 NOTE — Patient Instructions (Addendum)
Follow up with Dr Sondra Come in January of 2017 and follow up with Dr Denman George in April 2017 after your appointment with Dr Sondra Come in January.

## 2014-10-08 NOTE — Progress Notes (Signed)
Followup Note: Gyn-Onc  Consult was requested by Dr. Louis Meckel for the evaluation of [name] 81 y.o. female with a history of squamous cell carcinoma of unknown origin.  CC:  Chief Complaint  Patient presents with  . cancer followup    Assessment/Plan:  [name]  is a 81 y.o.  year old who was initially seen in consultation at the request of Dr. 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   She is NED (no evidence of disease) after definitive primary pelvic radiation completed September, 2015.  She is doing well with no symptoms or toxicities from therapy.  Plan is to followup with Dr Sondra Come in 3 months and with me in April 2016.  HPI: [name] is an 81 year old woman with a poorly differentiated squamous cell carcinoma arising in the vesicular vaginal region of the central pelvis.   she again developing difficulty initiating urination in October of 2014 after her a reversal of a Hartman's procedure and take down of colostomy. Of note during that surgery no pelvic pathology was appreciated. She was seen by Dr. Louis Meckel from [facility].  An MRI confirmed a 5 cm mass in the posterior trigone and vaginal cuff centrally. There was suggestion that the mass was originating from the uterine cervix, however the patient has a history of a prior TAH with removal of the cervi.. A cystoscopy confirmed the mass residing between the bladder and the vagina, with normal vaginal mucosa.   Cystoscopy on 06/30/13 also retrieved samples from the posterior wall and trigone of the bladder with transurethral resection. The blood him mucosa was involved with a fungating tumor at the floor of the bladder.   PET scan 07/14/2013 revealed PET avid 5 cm lesion involving the vaginal cuff and posterior bladder base. Small less than 1 cm hypermetabolic lymph nodes were seen in the right iliac chain and left internal iliac chain both of which were  consistent with metastatic disease.  She was treated with definitve radiation therapy to the pelvis by Dr Sondra Come (55Gy in 25 fractions from 08/11/14 to 09/15/13). Her performance status and comorbidities did not allow for chemosensitization.  Interval History:  She has remained symptom free and pain free since completing treatment in September 2015  Current Meds:  Outpatient Encounter Prescriptions as of 10/08/2014  Medication Sig  . acetaminophen (TYLENOL) 500 MG tablet Take 1,000 mg by mouth every 6 (six) hours as needed (Pain).  Marland Kitchen albuterol (PROVENTIL HFA;VENTOLIN HFA) 108 (90 BASE) MCG/ACT inhaler Inhale 2 puffs into the lungs every 6 (six) hours as needed for wheezing.  Marland Kitchen aspirin 81 MG tablet Take 81 mg by mouth daily.  . furosemide (LASIX) 80 MG tablet Take 40 mg by mouth daily.  Marland Kitchen lisinopril (PRINIVIL,ZESTRIL) 20 MG tablet Take 10 mg by mouth every morning.   . magnesium hydroxide (MILK OF MAGNESIA) 400 MG/5ML suspension Take by mouth daily as needed for mild constipation.  . polyvinyl alcohol (LIQUIFILM TEARS) 1.4 % ophthalmic solution Place 1 drop into both eyes as needed for dry eyes.  . [DISCONTINUED] aspirin 325 MG tablet Take 325 mg by mouth every 4 (four) hours as needed.  . Guaifenesin (MUCINEX MAXIMUM STRENGTH) 1200 MG TB12 Take 1 tablet (1,200 mg total) by mouth 2 (two) times daily. (Patient not taking: Reported on 07/08/2014)  . zolpidem (AMBIEN) 5 MG tablet   . [DISCONTINUED] polyethylene glycol (MIRALAX / GLYCOLAX) packet Take 17 g by mouth as needed.  . [  DISCONTINUED] temazepam (RESTORIL) 15 MG capsule   . [DISCONTINUED] Vitamin D, Ergocalciferol, (DRISDOL) 50000 UNITS CAPS Take 50,000 Units by mouth every 14 (fourteen) days. Sunday   No facility-administered encounter medications on file as of 10/08/2014.    Allergy:  Allergies  Allergen Reactions  . Indomethacin Anaphylaxis and Other (See Comments)    "took it fine for awhile; one day I stopped breathing and I ended up  in the hospital" (01/02/2012)    Social Hx:   Social History   Social History  . Marital Status: Widowed    Spouse Name: N/A  . Number of Children: 6  . Years of Education: N/A   Occupational History  . retired    Social History Main Topics  . Smoking status: Former Smoker -- 0.12 packs/day for 35 years    Types: Cigarettes    Quit date: 01/02/1991  . Smokeless tobacco: Never Used  . Alcohol Use: No  . Drug Use: No  . Sexual Activity: No   Other Topics Concern  . Not on file   Social History Narrative    Past Surgical Hx:  Past Surgical History  Procedure Laterality Date  . Cataract extraction w/ intraocular lens  implant, bilateral  ?1990's    Bil  . Appendectomy  1980's    "when they did hysterectomy" (01/02/2012)  . Abdominal hysterectomy  1980's  . Colostomy revision N/A 03/12/2012    Procedure: COLON RESECTION SIGMOID ;  Surgeon: Ralene Ok, MD;  Location: [place];  Service: General;  Laterality: N/A;  . Colostomy N/A 03/12/2012    Procedure: COLOSTOMY;  Surgeon: Ralene Ok, MD;  Location: [place];  Service: General;  Laterality: N/A;  . Colostomy takedown N/A 10/21/2012    Procedure: LAPAROSCOPIC COLOSTOMY TAKEDOWN AND HARTMANS ANASTOMOSIS, rigid proctoscopy;  Surgeon: Ralene Ok, MD;  Location: [facility];  Service: General;  Laterality: N/A;  . Lysis of adhesion N/A 10/21/2012    Procedure: LYSIS OF ADHESION;  Surgeon: Ralene Ok, MD;  Location: [facility];  Service: General;  Laterality: N/A;  . Transurethral resection of bladder tumor with gyrus (turbt-gyrus) N/A 06/30/2013    Procedure: TRANSURETHRAL RESECTION OF BLADDER TUMOR WITH GYRUS (TURBT-GYRUS)/VAGINAL BIOPSY;  Surgeon: Ardis Hughs, MD;  Location[facility];  Service: Urology;  Laterality: N/A;    Past Medical Hx:  Past Medical History  Diagnosis Date  . Hypertension   . COPD (chronic obstructive pulmonary disease) ([place])   . CHF (congestive heart failure) ([place])   . SOB (shortness of  breath)     'all the time" (01/02/2012)  . Arthritis     "right shoulder" (01/02/2012)  . Pneumonia 01/02/2012; ? 2009  . GERD (gastroesophageal reflux disease)     occasional  . Anemia     years ago  . Diverticulitis of colon with perforation 01/14/2012    That is post sigmoid resection with [facility] pouch and end colostomy   . CAP (community acquired pneumonia) 01/02/2012    Lower lobe   . Intra-abdominal abscess ([place]) 01/14/2012  . Pneumatosis of intestines 01/14/2012  . UTI (lower urinary tract infection) 01/03/2012  . Bladder mass   . History of kidney stones     X 1  . Difficulty sleeping   . Goiter     radioactive iodine ablation/notes 01/02/2012  . Hypothyroidism     "I have taken Synthroid before" (01/02/2012)   HAS HAD RADIOACTIVE IODINE TX 2 YR S AGO  . Radiation 08/10/13-09/15/13    55 gray to  vaginal/bladder mass    Past Gynecological History:  SVD x6. No prior abn pap smears. Hx of hysterectomy for "bleeding" in 1980's.  No LMP recorded. Patient has had a hysterectomy.  Family Hx:  Family History  Problem Relation Age of Onset  . Heart disease    . Cancer Sister     Breast  . Heart disease Mother     Review of Systems:  Constitutional  Feels pain in pelvix  ENT Normal appearing ears and nares bilaterally Skin/Breast  No rash, sores, jaundice, itching, dryness Cardiovascular  No chest pain, shortness of breath, or edema  Pulmonary  No cough or wheeze.  Gastro Intestinal  No nausea, vomitting, or diarrhoea. No bright red blood per rectum, no abdominal pain. +++ constipation.  Genito Urinary  No frequency, urgency, dysuria, see HPI Musculo Skeletal  No myalgia, arthralgia, joint swelling or pain  Neurologic  No weakness, numbness, change in gait,  Psychology  No depression, anxiety, insomnia.   Vitals:  Blood pressure 155/63, pulse 70, temperature 98.1 F (36.7 C), temperature source Oral, resp. rate 18, height 5\' 3"  (1.6 m), weight 156 lb 12.8 oz (71.124 kg),  SpO2 95 %.  Physical Exam: WD in NAD Neck  Supple NROM, without any enlargements.  Lymph Node Survey No cervical supraclavicular or inguinal adenopathy Cardiovascular  Pulse normal rate, regularity and rhythm. S1 and S2 normal.  Lungs  Clear to auscultation bilateraly, without wheezes/crackles/rhonchi. Good air movement.  Skin  No rash/lesions/breakdown  Psychiatry  Alert and oriented to person, place, and time  Abdomen  Normoactive bowel sounds, abdomen soft, non-tender and obese. She has a left mid abdominal 4cm incisional hernia.It is soft and easily reducible. Back No CVA tenderness Genito Urinary  Vulva/vagina: Normal external female genitalia.  No lesions. No discharge or bleeding.  Bladder/urethra:  Urethral meatus normal. Bladder has palpable mass centrally from mid vagina to vaginal apex.Minimally mobile.  Vagina: vaginal mucosa normal without lesions. No visible lesions. No blood.   Cervix: surgically absent  Uterus: surgically absent   Adnexa: no palpable masses. Rectal  Good tone, no masses no cul de sac nodularity. Unable to appreciate anastamosis. No palpable obstruction or masses. Extremities  No bilateral cyanosis, clubbing or edema.   Donaciano Eva, MD   10/08/2014, 4:11 PM

## 2015-01-06 ENCOUNTER — Encounter: Payer: Self-pay | Admitting: Radiation Oncology

## 2015-01-06 ENCOUNTER — Ambulatory Visit
Admission: RE | Admit: 2015-01-06 | Discharge: 2015-01-06 | Disposition: A | Discharge status: Home / Self Care | Payer: Medicare Other | Source: Ambulatory Visit | Attending: Radiation Oncology | Admitting: Radiation Oncology

## 2015-01-06 VITALS — BP 169/69 | HR 71 | Temp 98.1°F | Resp 24 | Ht 63.0 in | Wt 158.3 lb

## 2015-01-06 DIAGNOSIS — C4492 Squamous cell carcinoma of skin, unspecified: Secondary | ICD-10-CM

## 2015-01-06 DIAGNOSIS — R0600 Dyspnea, unspecified: Secondary | ICD-10-CM

## 2015-01-06 NOTE — Progress Notes (Signed)
Radiation Oncology         [phone] ________________________________  Name: [name] MRN: [id number]  Date: 01/06/2015  DOB: December 27, 1925    Follow-Up Visit Note  CC: Leonard Downing, MD  Everitt Amber, MD    ICD-9-CM ICD-10-CM   1. Squamous cell carcinoma of unknown origin (HCC) 199.1 C80.1     Diagnosis:    Squamous cell carcinoma of unknown origin presenting in the bladder and upper vaginal area  Interval Since Last Radiation:  16  months  Narrative:  The patient returns today for routine follow-up. She denies pain. She denies having any bladder issues or bowel issues (last bm was yesterday). She denies having any vaginal/rectal bleeding or discharge. Denies pelvic pain. Reports increased urinary frequency with mild dysuria. She reports feeling short of breath today and reports it is worse with activity. She uses 2L of oxygen at home. She was diagnosed with COPD. She reports having fatigue. She uses her vaginal dilator occasionally. Patient notes a goiter on her neck that does not cause her issues.              ALLERGIES:  is allergic to indomethacin.  Meds: Current Outpatient Prescriptions  Medication Sig Dispense Refill  . albuterol (PROVENTIL HFA;VENTOLIN HFA) 108 (90 BASE) MCG/ACT inhaler Inhale 2 puffs into the lungs every 6 (six) hours as needed for wheezing.    Marland Kitchen aspirin 325 MG tablet Take 325 mg by mouth daily.    . furosemide (LASIX) 80 MG tablet Take 40 mg by mouth daily.    Marland Kitchen lisinopril (PRINIVIL,ZESTRIL) 20 MG tablet Take 10 mg by mouth every morning.     . magnesium hydroxide (MILK OF MAGNESIA) 400 MG/5ML suspension Take by mouth daily as needed for mild constipation.    . polyvinyl alcohol (LIQUIFILM TEARS) 1.4 % ophthalmic solution Place 1 drop into both eyes as needed for dry eyes.    Marland Kitchen zolpidem (AMBIEN) 5 MG tablet     . acetaminophen (TYLENOL) 500 MG tablet Take 1,000 mg by mouth every 6 (six) hours as needed (Pain). Reported on 01/06/2015    . aspirin  81 MG tablet Take 81 mg by mouth daily. Reported on 01/06/2015    . Guaifenesin (MUCINEX MAXIMUM STRENGTH) 1200 MG TB12 Take 1 tablet (1,200 mg total) by mouth 2 (two) times daily. (Patient not taking: Reported on 07/08/2014) 14 each 0   No current facility-administered medications for this encounter.    Physical Findings: The patient is in no acute distress. Patient is alert and oriented.  height is 5\' 3"  (1.6 m) and weight is 158 lb 4.8 oz (71.804 kg). Her oral temperature is 98.1 F (36.7 C). Her blood pressure is 169/69 and her pulse is 71. Her respiration is 24 and oxygen saturation is 96%. .    General: Alert and oriented, in no acute distress HEENT: Head is normocephalic.  Neck: Neck is supple, no palpable cervical or supraclavicular lymphadenopathy. Right lobe thyroid is significantly enlarged. Heart: Regular in rate and rhythm with no murmurs, rubs, or gallops. Chest: Clear to auscultation bilaterally, with no rhonchi, wheezes, or rales. Abdomen: Soft, nontender, nondistended, with no rigidity or guarding. Extremities: No cyanosis or edema. Gynecological: No inguinal adenopathy appreciated. On pelvic examination the external genitalia are unremarkable. Speculum exam is performed. There are no mucosal lesions noted within the vaginal vault. On bimanual and rectovaginal examination I am unable to palpate any residual mass along the anterior vaginal wall/anterior pelvis region.   Lab Findings:  Lab Results  Component Value Date   WBC 5.8 09/02/2014   HGB 11.7 09/02/2014   HCT 34.9 09/02/2014   MCV 94.3 09/02/2014   PLT 256 09/02/2014    Radiographic Findings: No results found.  Impression:  No evidence of recurrence on physical exam today.  Plan:  Routine follow up with radiation oncology in 6 months. In interim patient will be seen by gynecological oncology.   -----------------------------------  Blair Promise, PhD, MD  This document serves as a record of services  personally performed by Gery Pray, MD. It was created on his behalf by Arlyce Harman, a trained medical scribe. The creation of this record is based on the scribe's personal observations and the provider's statements to them. This document has been checked and approved by the attending provider.

## 2015-01-06 NOTE — Addendum Note (Signed)
Encounter addended by: Jacqulyn Liner, RN on: 01/06/2015 11:14 AM<BR>     Documentation filed: Visit Diagnoses, Dx Association, Orders

## 2015-01-06 NOTE — Progress Notes (Signed)
Orvis Brill here for follow up.  She denies pain.  She denies having any bladder issues or bowel issues (last bm was yesterday).  She denies having any vaginal/rectal bleeding or discharge.  She reports feeling short of breath today and reports it is worse with activity.  She uses 2L of oxygen at home.  She reports having fatigue.  She uses her vaginal dilator occasionally.  BP 169/69 mmHg  Pulse 71  Temp(Src) 98.1 F (36.7 C) (Oral)  Resp 24  Ht 5\' 3"  (1.6 m)  Wt 158 lb 4.8 oz (71.804 kg)  BMI 28.05 kg/m2  SpO2 96%

## 2015-04-11 ENCOUNTER — Observation Stay (HOSPITAL_COMMUNITY): Payer: Medicare Other

## 2015-04-11 ENCOUNTER — Encounter (HOSPITAL_COMMUNITY): Payer: Self-pay | Admitting: *Deleted

## 2015-04-11 ENCOUNTER — Inpatient Hospital Stay (HOSPITAL_COMMUNITY)
Admission: EM | Admit: 2015-04-11 | Discharge: 2015-04-14 | DRG: 065 | Disposition: A | Discharge status: Home / Self Care | Payer: Medicare Other | Attending: Internal Medicine | Admitting: Internal Medicine

## 2015-04-11 ENCOUNTER — Emergency Department (HOSPITAL_COMMUNITY): Payer: Medicare Other

## 2015-04-11 DIAGNOSIS — J449 Chronic obstructive pulmonary disease, unspecified: Secondary | ICD-10-CM | POA: Diagnosis present

## 2015-04-11 DIAGNOSIS — E049 Nontoxic goiter, unspecified: Secondary | ICD-10-CM | POA: Diagnosis not present

## 2015-04-11 DIAGNOSIS — D649 Anemia, unspecified: Secondary | ICD-10-CM | POA: Diagnosis present

## 2015-04-11 DIAGNOSIS — Z66 Do not resuscitate: Secondary | ICD-10-CM | POA: Diagnosis present

## 2015-04-11 DIAGNOSIS — R2990 Unspecified symptoms and signs involving the nervous system: Secondary | ICD-10-CM | POA: Diagnosis not present

## 2015-04-11 DIAGNOSIS — I6330 Cerebral infarction due to thrombosis of unspecified cerebral artery: Secondary | ICD-10-CM

## 2015-04-11 DIAGNOSIS — I63412 Cerebral infarction due to embolism of left middle cerebral artery: Secondary | ICD-10-CM | POA: Insufficient documentation

## 2015-04-11 DIAGNOSIS — Z6831 Body mass index (BMI) 31.0-31.9, adult: Secondary | ICD-10-CM

## 2015-04-11 DIAGNOSIS — I639 Cerebral infarction, unspecified: Secondary | ICD-10-CM | POA: Diagnosis present

## 2015-04-11 DIAGNOSIS — Z7982 Long term (current) use of aspirin: Secondary | ICD-10-CM

## 2015-04-11 DIAGNOSIS — I447 Left bundle-branch block, unspecified: Secondary | ICD-10-CM | POA: Diagnosis present

## 2015-04-11 DIAGNOSIS — I472 Ventricular tachycardia: Secondary | ICD-10-CM | POA: Diagnosis present

## 2015-04-11 DIAGNOSIS — R531 Weakness: Secondary | ICD-10-CM | POA: Diagnosis not present

## 2015-04-11 DIAGNOSIS — E785 Hyperlipidemia, unspecified: Secondary | ICD-10-CM | POA: Insufficient documentation

## 2015-04-11 DIAGNOSIS — R471 Dysarthria and anarthria: Secondary | ICD-10-CM | POA: Diagnosis present

## 2015-04-11 DIAGNOSIS — I5032 Chronic diastolic (congestive) heart failure: Secondary | ICD-10-CM | POA: Diagnosis present

## 2015-04-11 DIAGNOSIS — R29701 NIHSS score 1: Secondary | ICD-10-CM | POA: Diagnosis present

## 2015-04-11 DIAGNOSIS — I10 Essential (primary) hypertension: Secondary | ICD-10-CM | POA: Diagnosis not present

## 2015-04-11 DIAGNOSIS — E039 Hypothyroidism, unspecified: Secondary | ICD-10-CM | POA: Diagnosis present

## 2015-04-11 DIAGNOSIS — I110 Hypertensive heart disease with heart failure: Secondary | ICD-10-CM | POA: Diagnosis present

## 2015-04-11 DIAGNOSIS — Z9842 Cataract extraction status, left eye: Secondary | ICD-10-CM

## 2015-04-11 DIAGNOSIS — K219 Gastro-esophageal reflux disease without esophagitis: Secondary | ICD-10-CM | POA: Diagnosis present

## 2015-04-11 DIAGNOSIS — E663 Overweight: Secondary | ICD-10-CM | POA: Diagnosis present

## 2015-04-11 DIAGNOSIS — Z8639 Personal history of other endocrine, nutritional and metabolic disease: Secondary | ICD-10-CM

## 2015-04-11 DIAGNOSIS — E669 Obesity, unspecified: Secondary | ICD-10-CM | POA: Diagnosis present

## 2015-04-11 DIAGNOSIS — Z87891 Personal history of nicotine dependence: Secondary | ICD-10-CM

## 2015-04-11 DIAGNOSIS — I6349 Cerebral infarction due to embolism of other cerebral artery: Secondary | ICD-10-CM | POA: Diagnosis not present

## 2015-04-11 DIAGNOSIS — Z9841 Cataract extraction status, right eye: Secondary | ICD-10-CM

## 2015-04-11 DIAGNOSIS — I5042 Chronic combined systolic (congestive) and diastolic (congestive) heart failure: Secondary | ICD-10-CM | POA: Diagnosis present

## 2015-04-11 DIAGNOSIS — Z961 Presence of intraocular lens: Secondary | ICD-10-CM | POA: Diagnosis present

## 2015-04-11 LAB — COMPREHENSIVE METABOLIC PANEL
ALBUMIN: 3.7 g/dL (ref 3.5–5.0)
ALT: 12 U/L — AB (ref 14–54)
AST: 18 U/L (ref 15–41)
Alkaline Phosphatase: 61 U/L (ref 38–126)
Anion gap: 11 (ref 5–15)
BUN: 19 mg/dL (ref 6–20)
CHLORIDE: 105 mmol/L (ref 101–111)
CO2: 27 mmol/L (ref 22–32)
Calcium: 9 mg/dL (ref 8.9–10.3)
Creatinine, Ser: 0.96 mg/dL (ref 0.44–1.00)
GFR calc Af Amer: 60 mL/min — ABNORMAL LOW (ref >60)
GFR calc non Af Amer: 52 mL/min — ABNORMAL LOW (ref >60)
GLUCOSE: 109 mg/dL — AB (ref 65–99)
POTASSIUM: 3.9 mmol/L (ref 3.5–5.1)
Sodium: 143 mmol/L (ref 135–145)
Total Bilirubin: 0.8 mg/dL (ref 0.3–1.2)
Total Protein: 6.6 g/dL (ref 6.5–8.1)

## 2015-04-11 LAB — CBC
HCT: 35.7 % — ABNORMAL LOW (ref 36.0–46.0)
HEMOGLOBIN: 12 g/dL (ref 12.0–15.0)
MCH: 31.7 pg (ref 26.0–34.0)
MCHC: 33.6 g/dL (ref 30.0–36.0)
MCV: 94.4 fL (ref 78.0–100.0)
Platelets: 242 10*3/uL (ref 150–400)
RBC: 3.78 MIL/uL — ABNORMAL LOW (ref 3.87–5.11)
RDW: 12.6 % (ref 11.5–15.5)
WBC: 5.5 10*3/uL (ref 4.0–10.5)

## 2015-04-11 LAB — URINALYSIS, ROUTINE W REFLEX MICROSCOPIC
BILIRUBIN URINE: NEGATIVE
GLUCOSE, UA: NEGATIVE mg/dL
HGB URINE DIPSTICK: NEGATIVE
KETONES UR: NEGATIVE mg/dL
Nitrite: NEGATIVE
PROTEIN: 30 mg/dL — AB
Specific Gravity, Urine: 1.025 (ref 1.005–1.030)
pH: 6 (ref 5.0–8.0)

## 2015-04-11 LAB — URINE MICROSCOPIC-ADD ON

## 2015-04-11 LAB — DIFFERENTIAL
BASOS ABS: 0.1 10*3/uL (ref 0.0–0.1)
BASOS PCT: 1 %
EOS ABS: 0.4 10*3/uL (ref 0.0–0.7)
Eosinophils Relative: 7 %
Lymphocytes Relative: 17 %
Lymphs Abs: 0.9 10*3/uL (ref 0.7–4.0)
Monocytes Absolute: 0.5 10*3/uL (ref 0.1–1.0)
Monocytes Relative: 8 %
NEUTROS ABS: 3.7 10*3/uL (ref 1.7–7.7)
NEUTROS PCT: 67 %

## 2015-04-11 LAB — LIPID PANEL
Cholesterol: 176 mg/dL (ref 0–200)
HDL: 40 mg/dL — AB (ref >40)
LDL CALC: 110 mg/dL — AB (ref 0–99)
TRIGLYCERIDES: 130 mg/dL (ref <150)
Total CHOL/HDL Ratio: 4.4 RATIO
VLDL: 26 mg/dL (ref 0–40)

## 2015-04-11 LAB — RAPID URINE DRUG SCREEN, HOSP PERFORMED
AMPHETAMINES: NOT DETECTED
BARBITURATES: NOT DETECTED
BENZODIAZEPINES: NOT DETECTED
COCAINE: NOT DETECTED
Opiates: NOT DETECTED
TETRAHYDROCANNABINOL: NOT DETECTED

## 2015-04-11 LAB — CBG MONITORING, ED: Glucose-Capillary: 107 mg/dL — ABNORMAL HIGH (ref 65–99)

## 2015-04-11 LAB — I-STAT CHEM 8, ED
BUN: 22 mg/dL — ABNORMAL HIGH (ref 6–20)
CREATININE: 0.9 mg/dL (ref 0.44–1.00)
Calcium, Ion: 1.1 mmol/L — ABNORMAL LOW (ref 1.13–1.30)
Chloride: 103 mmol/L (ref 101–111)
GLUCOSE: 106 mg/dL — AB (ref 65–99)
HCT: 37 % (ref 36.0–46.0)
HEMOGLOBIN: 12.6 g/dL (ref 12.0–15.0)
Potassium: 3.8 mmol/L (ref 3.5–5.1)
Sodium: 144 mmol/L (ref 135–145)
TCO2: 28 mmol/L (ref 0–100)

## 2015-04-11 LAB — I-STAT TROPONIN, ED: Troponin i, poc: 0.03 ng/mL (ref 0.00–0.08)

## 2015-04-11 LAB — APTT: APTT: 41 s — AB (ref 24–37)

## 2015-04-11 LAB — PROTIME-INR
INR: 1.14 (ref 0.00–1.49)
Prothrombin Time: 14.8 seconds (ref 11.6–15.2)

## 2015-04-11 LAB — TSH: TSH: 0.665 u[IU]/mL (ref 0.350–4.500)

## 2015-04-11 LAB — T4, FREE: Free T4: 0.72 ng/dL (ref 0.61–1.12)

## 2015-04-11 LAB — ETHANOL

## 2015-04-11 MED ORDER — ALBUTEROL SULFATE HFA 108 (90 BASE) MCG/ACT IN AERS: 2.0000 | INHALATION_SPRAY | Freq: Four times a day (QID) | RESPIRATORY_TRACT | Status: DC | PRN

## 2015-04-11 MED ORDER — ENOXAPARIN SODIUM 40 MG/0.4ML ~~LOC~~ SOLN
40.0000 mg | SUBCUTANEOUS | Status: DC
  Administered 2015-04-11 – 2015-04-14 (×4): 40 mg via SUBCUTANEOUS
  Filled 2015-04-11 (×4): qty 0.4

## 2015-04-11 MED ORDER — LORAZEPAM 2 MG/ML IJ SOLN
0.5000 mg | Freq: Once | INTRAMUSCULAR | Status: AC
  Administered 2015-04-11: 0.5 mg via INTRAVENOUS
  Filled 2015-04-11: qty 1

## 2015-04-11 MED ORDER — FUROSEMIDE 20 MG PO TABS: 40.0000 mg | ORAL_TABLET | Freq: Every day | ORAL | Status: DC

## 2015-04-11 MED ORDER — ASPIRIN 325 MG PO TABS
325.0000 mg | ORAL_TABLET | Freq: Every day | ORAL | Status: DC
  Administered 2015-04-11 – 2015-04-12 (×2): 325 mg via ORAL
  Filled 2015-04-11 (×2): qty 1

## 2015-04-11 MED ORDER — ALBUTEROL SULFATE (2.5 MG/3ML) 0.083% IN NEBU: 2.5000 mg | INHALATION_SOLUTION | Freq: Four times a day (QID) | RESPIRATORY_TRACT | Status: DC | PRN

## 2015-04-11 MED ORDER — ZOLPIDEM TARTRATE 5 MG PO TABS: 5.0000 mg | ORAL_TABLET | Freq: Every evening | ORAL | Status: DC | PRN

## 2015-04-11 MED ORDER — SENNOSIDES-DOCUSATE SODIUM 8.6-50 MG PO TABS: 1.0000 | ORAL_TABLET | Freq: Every evening | ORAL | Status: DC | PRN

## 2015-04-11 MED ORDER — SODIUM CHLORIDE 0.9 % IV SOLN
INTRAVENOUS | Status: DC
  Administered 2015-04-11: 21:00:00 via INTRAVENOUS

## 2015-04-11 MED ORDER — STROKE: EARLY STAGES OF RECOVERY BOOK
Freq: Once | Status: AC
  Administered 2015-04-11: 20:00:00
  Filled 2015-04-11: qty 1

## 2015-04-11 MED ORDER — POLYVINYL ALCOHOL 1.4 % OP SOLN: 1.0000 [drp] | OPHTHALMIC | Status: DC | PRN

## 2015-04-11 NOTE — ED Notes (Signed)
Dr. Leonel Ramsay at bedside to discuss plan of care options w/ pt.

## 2015-04-11 NOTE — Progress Notes (Signed)
Pt came to MRI for brain/mra and soft tissue neck.  Pt had to be sedated due to claustrophobia, the brain and mra was completed.  But pt does a lot of swallowing and neck movements even while sedated and diagnostic images of this area could not be obtained by MRI.  Pt sent back to ER after exam.

## 2015-04-11 NOTE — ED Provider Notes (Signed)
CSN: [id number]     Arrival date & time 04/11/15  [id number] History   First MD Initiated Contact with Patient 04/11/15 [phone]     Chief Complaint  Patient presents with  . Code Stroke    An emergency department physician performed an initial assessment on this suspected stroke patient at [phone].   HPI Patient presents to the emergency department with developing onset of slurred speech and right arm weakness at 8 AM.  She states she was unable to move her arms this time.  She woke at approximately 6:30 or 7 without symptoms.  She has a history of hypertension, congestive heart failure, GERD, anemia.  No prior history of stroke.  She states that her symptoms in her right upper extremity are persistent and that she feels abnormal and her right hand.  EMS reports slurred speech as well.  Blood sugar was normal on their arrival.  Patient presented to the emergency department as a code stroke.  Denies symptoms in her lower extremities at this time    Past Medical History  Diagnosis Date  . Hypertension   . COPD (chronic obstructive pulmonary disease) ([place])   . CHF (congestive heart failure) ([place])   . SOB (shortness of breath)     'all the time" (01/02/2012)  . Arthritis     "right shoulder" (01/02/2012)  . Pneumonia 01/02/2012; ? 2009  . GERD (gastroesophageal reflux disease)     occasional  . Anemia     years ago  . Diverticulitis of colon with perforation 01/14/2012    That is post sigmoid resection with [facility] pouch and end colostomy   . CAP (community acquired pneumonia) 01/02/2012    Lower lobe   . Intra-abdominal abscess ([place]) 01/14/2012  . Pneumatosis of intestines 01/14/2012  . UTI (lower urinary tract infection) 01/03/2012  . Bladder mass   . History of kidney stones     X 1  . Difficulty sleeping   . Goiter     radioactive iodine ablation/notes 01/02/2012  . Hypothyroidism     "I have taken Synthroid before" (01/02/2012)   HAS HAD RADIOACTIVE IODINE TX 2 YR S AGO  . Radiation 08/10/13-09/15/13     55 gray to vaginal/bladder mass   Past Surgical History  Procedure Laterality Date  . Cataract extraction w/ intraocular lens  implant, bilateral  ?1990's    Bil  . Appendectomy  1980's    "when they did hysterectomy" (01/02/2012)  . Abdominal hysterectomy  1980's  . Colostomy revision N/A 03/12/2012    Procedure: COLON RESECTION SIGMOID ;  Surgeon: Ralene Ok, MD;  Location: [place];  Service: General;  Laterality: N/A;  . Colostomy N/A 03/12/2012    Procedure: COLOSTOMY;  Surgeon: Ralene Ok, MD;  Location: [place];  Service: General;  Laterality: N/A;  . Colostomy takedown N/A 10/21/2012    Procedure: LAPAROSCOPIC COLOSTOMY TAKEDOWN AND HARTMANS ANASTOMOSIS, rigid proctoscopy;  Surgeon: Ralene Ok, MD;  Location: [facility];  Service: General;  Laterality: N/A;  . Lysis of adhesion N/A 10/21/2012    Procedure: LYSIS OF ADHESION;  Surgeon: Ralene Ok, MD;  Location: [facility];  Service: General;  Laterality: N/A;  . Transurethral resection of bladder tumor with gyrus (turbt-gyrus) N/A 06/30/2013    Procedure: TRANSURETHRAL RESECTION OF BLADDER TUMOR WITH GYRUS (TURBT-GYRUS)/VAGINAL BIOPSY;  Surgeon: Ardis Hughs, MD;  Location: [facility];  Service: Urology;  Laterality: N/A;   Family History  Problem Relation Age of Onset  . Heart disease    .  Cancer Sister     Breast  . Heart disease Mother    Social History  Substance Use Topics  . Smoking status: Former Smoker -- 0.12 packs/day for 35 years    Types: Cigarettes    Quit date: 01/02/1991  . Smokeless tobacco: Never Used  . Alcohol Use: No   OB History    No data available     Review of Systems  All other systems reviewed and are negative.     Allergies  Indomethacin  Home Medications   Prior to Admission medications   Medication Sig Start Date End Date Taking? Authorizing Provider  albuterol (PROVENTIL HFA;VENTOLIN HFA) 108 (90 BASE) MCG/ACT inhaler Inhale 2 puffs into the lungs every 6 (six) hours as  needed for wheezing.   Yes Historical Provider, MD  aspirin 325 MG tablet Take 325 mg by mouth daily.   Yes Historical Provider, MD  furosemide (LASIX) 80 MG tablet Take 40 mg by mouth daily.   Yes Historical Provider, MD  lisinopril (PRINIVIL,ZESTRIL) 20 MG tablet Take 10 mg by mouth every morning.  03/31/12  Yes Barton Dubois, MD  polyvinyl alcohol (LIQUIFILM TEARS) 1.4 % ophthalmic solution Place 1 drop into both eyes as needed for dry eyes.   Yes Historical Provider, MD  zolpidem (AMBIEN) 5 MG tablet  09/07/14  Yes Historical Provider, MD  acetaminophen (TYLENOL) 500 MG tablet Take 1,000 mg by mouth every 6 (six) hours as needed (Pain). Reported on 01/06/2015    Historical Provider, MD  aspirin 81 MG tablet Take 81 mg by mouth daily. Reported on 01/06/2015    Historical Provider, MD  Guaifenesin [facility] MAXIMUM STRENGTH) 1200 MG TB12 Take 1 tablet (1,200 mg total) by mouth 2 (two) times daily. Patient not taking: Reported on 07/08/2014 02/17/14   Verlee Monte, MD  magnesium hydroxide (MILK OF MAGNESIA) 400 MG/5ML suspension Take by mouth daily as needed for mild constipation.    Historical Provider, MD   BP 181/82 mmHg  Pulse 76  Temp(Src) 98.6 F (37 C) (Oral)  Resp 23  Ht 5\' 3"  (1.6 m)  Wt 176 lb 6.4 oz (80.015 kg)  BMI 31.26 kg/m2  SpO2 94% Physical Exam  Constitutional: She is oriented to person, place, and time. She appears well-developed and well-nourished. No distress.  HENT:  Head: Normocephalic and atraumatic.  Eyes: EOM are normal.  Neck: Normal range of motion.  Cardiovascular: Normal rate, regular rhythm and normal heart sounds.   Pulmonary/Chest: Effort normal and breath sounds normal.  Abdominal: Soft. She exhibits no distension. There is no tenderness.  Musculoskeletal: Normal range of motion.  Neurological: She is alert and oriented to person, place, and time.  Mild dysarthric speech.  Face symmetric.  Mild weakness of the right upper extremity as compared to left.  Normal  strength in lower extremities  Skin: Skin is warm and dry.  Psychiatric: She has a normal mood and affect. Judgment normal.  Nursing note and vitals reviewed.   ED Course  Procedures (including critical care time)  CRITICAL CARE Performed by: Hoy Morn Total critical care time: 32 minutes Critical care time was exclusive of separately billable procedures and treating other patients. Critical care was necessary to treat or prevent imminent or life-threatening deterioration. Critical care was time spent personally by me on the following activities: development of treatment plan with patient and/or surrogate as well as nursing, discussions with consultants, evaluation of patient's response to treatment, examination of patient, obtaining history from patient or surrogate, ordering  and performing treatments and interventions, ordering and review of laboratory studies, ordering and review of radiographic studies, pulse oximetry and re-evaluation of patient's condition.   Labs Review Labs Reviewed  APTT - Abnormal; Notable for the following:    aPTT 41 (*)    All other components within normal limits  CBC - Abnormal; Notable for the following:    RBC 3.78 (*)    HCT 35.7 (*)    All other components within normal limits  I-STAT CHEM 8, ED - Abnormal; Notable for the following:    BUN 22 (*)    Glucose, Bld 106 (*)    Calcium, Ion 1.10 (*)    All other components within normal limits  CBG MONITORING, ED - Abnormal; Notable for the following:    Glucose-Capillary 107 (*)    All other components within normal limits  PROTIME-INR  DIFFERENTIAL  ETHANOL  COMPREHENSIVE METABOLIC PANEL  URINE RAPID DRUG SCREEN, HOSP PERFORMED  URINALYSIS, ROUTINE W REFLEX MICROSCOPIC (NOT AT [facility])  I-STAT TROPOININ, ED    Imaging Review Ct Head Wo Contrast  04/11/2015  CLINICAL DATA:  Code stroke.  Sudden onset right arm weakness EXAM: CT HEAD WITHOUT CONTRAST TECHNIQUE: Contiguous axial images  were obtained from the base of the skull through the vertex without intravenous contrast. COMPARISON:  03/22/2012 FINDINGS: Old right occipital infarct. Mild chronic small vessel disease throughout the deep white matter. No acute intracranial abnormality. Specifically, no hemorrhage, hydrocephalus, mass lesion, acute infarction, or significant intracranial injury. No acute calvarial abnormality. Visualized paranasal sinuses and mastoids clear. Orbital soft tissues unremarkable. IMPRESSION: Old right occipital infarct, stable since 2014. No acute intracranial abnormality. Atrophy, chronic microvascular disease. Critical Value/emergent results were called by telephone at the time of interpretation on 04/11/2015 at 9:56 am to Dr. Leonel Ramsay , who verbally acknowledged these results. Electronically Signed   By: Rolm Baptise M.D.   On: 04/11/2015 09:57   I have personally reviewed and evaluated these images and lab results as part of my medical decision-making.   EKG Interpretation   Date/Time:  Monday April 11 2015 10:03:38 EDT Ventricular Rate:  84 PR Interval:  183 QRS Duration: 144 QT Interval:  389 QTC Calculation: 460 R Axis:   -58 Text Interpretation:  Sinus rhythm Multiform ventricular premature  complexes Left bundle branch block No significant change was found  Confirmed by Jakaiden Fill  MD, Kionna Brier ([id number]) on 04/11/2015 10:37:24 AM      MDM   Final diagnoses:  Cerebrovascular accident (CVA), unspecified mechanism ([place])    Likely acute small left brain stroke.  Patient was offered TPA by neurology who at this time given the risks and benefits does not want TPA.  Patient be admitted the hospital for ongoing stroke workup.    Jola Schmidt, MD 04/11/15 1037

## 2015-04-11 NOTE — ED Notes (Signed)
Dr. Marily Memos, Admitting MD, aware that MRI was unable to do MRI Neck d/t pt coughing too often in scanner.

## 2015-04-11 NOTE — Consult Note (Signed)
Neurology Consultation Reason for Consult: Code stroke Referring Physician: Venora Maples, K  CC: Right hand difficulty  History is obtained from: Patient, family  HPI: [name] is a 82 y.o. female with a history of hypertension, squamous cell carcinoma who presents with right hand numbness and weakness that started suddenly around 7:45 AM. She states that she initially noticed it when she went to cook something and her hand was not working the way that it should. She has chronic problems with her right shoulder and this is unchanged, she does not feel weak more proximally, but she has very little use of her right hand and this is new. She also notes that she is slurring her speech.  Her NIH was 1, however due to the dysfunction of her dominant hand I did discuss IV TPA with the patient and offered to her. After hearing the discussion of the risks and benefits of IV TPA, the patient declined.  LKW: 7:45 AM tpa given?: no, mild symptoms    ROS: A 14 point ROS was performed and is negative except as noted in the HPI.   Past Medical History  Diagnosis Date  . Hypertension   . COPD (chronic obstructive pulmonary disease) ([place])   . CHF (congestive heart failure) ([place])   . SOB (shortness of breath)     'all the time" (01/02/2012)  . Arthritis     "right shoulder" (01/02/2012)  . Pneumonia 01/02/2012; ? 2009  . GERD (gastroesophageal reflux disease)     occasional  . Anemia     years ago  . Diverticulitis of colon with perforation 01/14/2012    That is post sigmoid resection with [facility] pouch and end colostomy   . CAP (community acquired pneumonia) 01/02/2012    Lower lobe   . Intra-abdominal abscess ([place]) 01/14/2012  . Pneumatosis of intestines 01/14/2012  . UTI (lower urinary tract infection) 01/03/2012  . Bladder mass   . History of kidney stones     X 1  . Difficulty sleeping   . Goiter     radioactive iodine ablation/notes 01/02/2012  . Hypothyroidism     "I have taken Synthroid before"  (01/02/2012)   HAS HAD RADIOACTIVE IODINE TX 2 YR S AGO  . Radiation 08/10/13-09/15/13    57 gray to vaginal/bladder mass     Family History  Problem Relation Age of Onset  . Heart disease    . Cancer Sister     Breast  . Heart disease Mother      Social History:  reports that she quit smoking about 24 years ago. Her smoking use included Cigarettes. She has a 4.2 pack-year smoking history. She has never used smokeless tobacco. She reports that she does not drink alcohol or use illicit drugs.   Exam: Current vital signs: BP 123/59 mmHg  Pulse 56  Temp(Src) 98.6 F (37 C) (Oral)  Resp 24  Ht 5\' 3"  (1.6 m)  Wt 80.015 kg (176 lb 6.4 oz)  BMI 31.26 kg/m2  SpO2 99% Vital signs in last 24 hours: Temp:  [98.6 F (37 C)] 98.6 F (37 C) (04/10 1005) Pulse Rate:  [56-76] 56 (04/10 1115) Resp:  [19-24] 24 (04/10 1115) BP: (123-181)/(59-82) 123/59 mmHg (04/10 1115) SpO2:  [94 %-99 %] 99 % (04/10 1115) Weight:  [80.015 kg (176 lb 6.4 oz)] 80.015 kg (176 lb 6.4 oz) (04/10 0958)   Physical Exam  Constitutional: Appears well-developed and well-nourished.  Psych: Affect appropriate to situation Eyes: No scleral injection HENT:  No OP obstrucion Head: Normocephalic.  Cardiovascular: Normal rate and regular rhythm.  Respiratory: Effort normal and breath sounds normal to anterior ascultation GI: Soft.  No distension. There is no tenderness.  Skin: WDI  Neuro: Mental Status: Patient is awake, alert, oriented to person, place, month, year, and situation. Patient is able to give a clear and coherent history. No signs of aphasia or neglect Cranial Nerves: II: Visual Fields are full. Pupils are equal, round, and reactive to light.   III,IV, VI: EOMI without ptosis or diploplia.  V: Facial sensation is symmetric to temperature VII: Facial movement is symmetric, but there is very mild dysarthria VIII: hearing is intact to voice X: Uvula elevates symmetrically XI: Shoulder shrug is  symmetric. XII: tongue is midline without atrophy or fasciculations.  Motor: Tone is normal. Bulk is normal. 5/5 strength was present in the left arm and bilateral legs, she has limitation in the right shoulder but when her arm is held in extension she is able to hold it without drift, in her hand she has very little movement of her fingers, unable to abductor fingers, flex her fingers or her thumb, or extend her fingers. She is able to extend above the wrist and she has some flexion of the wrist though this is weak as well  Sensory: Sensation is symmetric to light touch and temperature in the arms and legs. Cerebellar: HKS are intact bilaterally, unable perform finger-nose-finger on the right, intact on the left         I have reviewed labs in epic and the results pertinent to this consultation are: Chem 8-unremarkable  I have reviewed the images obtained:CT head-no hemorrhage   Impression: 82 year old female with sudden onset of difficulty with her right hand and dysarthria. I suspect that this is likely a clumsy hand/dysarthria syndrome from a small lacunar infarct. As I mentioned earlier, TPA was offered but declined by the family. She will need to be admitted for further workup.  Recommendations: 1. HgbA1c, fasting lipid panel 2. MRI, MRA  of the brain without contrast 3. Frequent neuro checks 4. Echocardiogram 5. Carotid dopplers 6. Prophylactic therapy-Antiplatelet med: Aspirin - dose 325mg  PO or 300mg  PR 7. Risk factor modification 8. Telemetry monitoring 9. PT consult, OT consult, Speech consult 10. please page stroke NP  Or  PA  Or MD  M-F from 8am -4 pm starting 4/11 as this patient will be followed by the stroke team at this point.   You can look them up on [email or web address]      Roland Rack, MD [facility] [phone]  If 7pm- 7am, please page neurology on call as listed in [place].

## 2015-04-11 NOTE — Progress Notes (Signed)
Patient arrived from ED via stretcher; patient is alert and oriented; family at bedside; oriented to room and unit routine; assisted to the bathroom; peri care provided. Patient can't use the right hand to guide the walker; grip is weak.

## 2015-04-11 NOTE — ED Notes (Signed)
Admitting at bedside 

## 2015-04-11 NOTE — ED Notes (Addendum)
Per EMS - pt from home, lives w/ family. Sudden onset right arm weakness @ 0800, states "I just wasn't able to move my arm while in the kitchen." Pt able to move right arm. Pt reports speech seeming slurred.   Pt reports hx right shoulder pain but never experienced right arm weakness.

## 2015-04-11 NOTE — H&P (Signed)
[facility] History and Physical  [name] [id number] DOB: 12/17/1925 DOA: 04/11/2015  Referring physician: Venora Maples PCP: Leonard Downing, MD   Chief Complaint: right upper extremity weakness  HPI: [name] is a delightful 82 y.o. female with a past medical history hypertension, COPD, chronic diastolic heart, GERD, diverticulitis, squama cell carcinoma presents emergency department with the chief complaint sudden onset slurred speech and right arm weakness. Initial evaluation concerning for stroke.  Permission is obtained from the patient and the daughter who is at the bedside. She states that she awakened this morning.fine ambulated per her walker to the kitchen to "start cooking" and noticed her right hand was quite weak. She states she was unable to make a fist or grip. He denies a numbness or tingling of that hand or arm. She does report some chronic right shoulder pain and decreased range of motion. She states she was unable to dial 911. Family members called EMS who reported slurred speech upon arrival. She denies any headache dizziness syncope or near-syncope. She denies any chest pain palpitations shortness of breath fever cough chills. She denies abdominal pain nausea vomiting diarrhea constipation. She denies dysuria hematuria frequency or urgency.  Emergency department she's afebrile hemodynamically stable and not hypoxic. She is provided with 325 mg of aspirin  In the emergency department she was offered TPA and refused. She was evaluated by the neurology who recommended admit  Review of Systems:  10 point review of systems reviewed and negative except as noted in HPI  Past Medical History  Diagnosis Date  . Hypertension   . COPD (chronic obstructive pulmonary disease) ([place])   . CHF (congestive heart failure) ([place])   . SOB (shortness of breath)     'all the time" (01/02/2012)  . Arthritis     "right shoulder" (01/02/2012)  . Pneumonia 01/02/2012; ? 2009  .  GERD (gastroesophageal reflux disease)     occasional  . Anemia     years ago  . Diverticulitis of colon with perforation 01/14/2012    That is post sigmoid resection with [facility] pouch and end colostomy   . CAP (community acquired pneumonia) 01/02/2012    Lower lobe   . Intra-abdominal abscess ([place]) 01/14/2012  . Pneumatosis of intestines 01/14/2012  . UTI (lower urinary tract infection) 01/03/2012  . Bladder mass   . History of kidney stones     X 1  . Difficulty sleeping   . Goiter     radioactive iodine ablation/notes 01/02/2012  . Hypothyroidism     "I have taken Synthroid before" (01/02/2012)   HAS HAD RADIOACTIVE IODINE TX 2 YR S AGO  . Radiation 08/10/13-09/15/13    57 gray to vaginal/bladder mass   Past Surgical History  Procedure Laterality Date  . Cataract extraction w/ intraocular lens  implant, bilateral  ?1990's    Bil  . Appendectomy  1980's    "when they did hysterectomy" (01/02/2012)  . Abdominal hysterectomy  1980's  . Colostomy revision N/A 03/12/2012    Procedure: COLON RESECTION SIGMOID ;  Surgeon: Ralene Ok, MD;  Location: [facility];  Service: General;  Laterality: N/A;  . Colostomy N/A 03/12/2012    Procedure: COLOSTOMY;  Surgeon: Ralene Ok, MD;  Location: [place];  Service: General;  Laterality: N/A;  . Colostomy takedown N/A 10/21/2012    Procedure: LAPAROSCOPIC COLOSTOMY TAKEDOWN AND HARTMANS ANASTOMOSIS, rigid proctoscopy;  Surgeon: Ralene Ok, MD;  Location: [facility];  Service: General;  Laterality: N/A;  . Lysis of adhesion  N/A 10/21/2012    Procedure: LYSIS OF ADHESION;  Surgeon: Ralene Ok, MD;  Location: [facility];  Service: General;  Laterality: N/A;  . Transurethral resection of bladder tumor with gyrus (turbt-gyrus) N/A 06/30/2013    Procedure: TRANSURETHRAL RESECTION OF BLADDER TUMOR WITH GYRUS (TURBT-GYRUS)/VAGINAL BIOPSY;  Surgeon: Ardis Hughs, MD;  Location: [facility];  Service: Urology;  Laterality: N/A;   Social History:  reports that she  quit smoking about 24 years ago. Her smoking use included Cigarettes. She has a 4.2 pack-year smoking history. She has never used smokeless tobacco. She reports that she does not drink alcohol or use illicit drugs. Patient lives at home with her son she ambulates with a walker she is moderate assist with ADLs recent falls. Able to make once a needs known Allergies  Allergen Reactions  . Indomethacin Anaphylaxis and Other (See Comments)    "took it fine for awhile; one day I stopped breathing and I ended up in the hospital" (01/02/2012)    Family History  Problem Relation Age of Onset  . Heart disease    . Cancer Sister     Breast  . Heart disease Mother      Prior to Admission medications   Medication Sig Start Date End Date Taking? Authorizing Provider  albuterol (PROVENTIL HFA;VENTOLIN HFA) 108 (90 BASE) MCG/ACT inhaler Inhale 2 puffs into the lungs every 6 (six) hours as needed for wheezing.   Yes Historical Provider, MD  aspirin 325 MG tablet Take 325 mg by mouth daily.   Yes Historical Provider, MD  furosemide (LASIX) 80 MG tablet Take 40 mg by mouth daily.   Yes Historical Provider, MD  lisinopril (PRINIVIL,ZESTRIL) 20 MG tablet Take 10 mg by mouth every morning.  03/31/12  Yes Barton Dubois, MD  polyvinyl alcohol (LIQUIFILM TEARS) 1.4 % ophthalmic solution Place 1 drop into both eyes as needed for dry eyes.   Yes Historical Provider, MD  zolpidem (AMBIEN) 5 MG tablet  09/07/14  Yes Historical Provider, MD  acetaminophen (TYLENOL) 500 MG tablet Take 1,000 mg by mouth every 6 (six) hours as needed (Pain). Reported on 01/06/2015    Historical Provider, MD  aspirin 81 MG tablet Take 81 mg by mouth daily. Reported on 01/06/2015    Historical Provider, MD  Guaifenesin [facility] MAXIMUM STRENGTH) 1200 MG TB12 Take 1 tablet (1,200 mg total) by mouth 2 (two) times daily. Patient not taking: Reported on 07/08/2014 02/17/14   Verlee Monte, MD  magnesium hydroxide (MILK OF MAGNESIA) 400 MG/5ML suspension  Take by mouth daily as needed for mild constipation.    Historical Provider, MD   Physical Exam: Filed Vitals:   04/11/15 1030 04/11/15 1045 04/11/15 1100 04/11/15 1115  BP: 138/68 125/60 136/65 123/59  Pulse: 61 58 56 56  Temp:      TempSrc:      Resp: 19 24 23 24   Height:      Weight:      SpO2: 98% 98% 98% 99%    Wt Readings from Last 3 Encounters:  04/11/15 80.015 kg (176 lb 6.4 oz)  01/06/15 71.804 kg (158 lb 4.8 oz)  10/08/14 71.124 kg (156 lb 12.8 oz)    General:  Appears calm and comfortable well nourished Eyes: PERRL, normal lids, irises & conjunctiva ENT: grossly normal hearing, lips & tongue because membranes of her mouth are moist and pink Neck: no LAD, masses or thyromegaly Cardiovascular: RRR, no m/r/g. No LE edema. Telemetry: SR, no arrhythmias  Respiratory: CTA  bilaterally, no w/r/r. Normal respiratory effort. Abdomen: soft, ntnd +BS Skin: no rash or induration seen on limited exam Musculoskeletal: grossly normal tone BUE/BLE joints without swelling/erythema Psychiatric: grossly normal mood and affect, speech fluent and appropriate Neurologic: speech clear facial symmetry. Midline Right grip 4/5 left grip 5/5 bilateral lower extremity strength 5 out of 5,          Labs on Admission:  Basic Metabolic Panel:  Recent Labs Lab 04/11/15 0950 04/11/15 0951  NA 143 144  K 3.9 3.8  CL 105 103  CO2 27  --   GLUCOSE 109* 106*  BUN 19 22*  CREATININE 0.96 0.90  CALCIUM 9.0  --    Liver Function Tests:  Recent Labs Lab 04/11/15 0950  AST 18  ALT 12*  ALKPHOS 61  BILITOT 0.8  PROT 6.6  ALBUMIN 3.7   No results for input(s): LIPASE, AMYLASE in the last 168 hours. No results for input(s): AMMONIA in the last 168 hours. CBC:  Recent Labs Lab 04/11/15 0950 04/11/15 0951  WBC 5.5  --   NEUTROABS 3.7  --   HGB 12.0 12.6  HCT 35.7* 37.0  MCV 94.4  --   PLT 242  --    Cardiac Enzymes: No results for input(s): CKTOTAL, CKMB, CKMBINDEX,  TROPONINI in the last 168 hours.  BNP (last 3 results) No results for input(s): BNP in the last 8760 hours.  ProBNP (last 3 results) No results for input(s): PROBNP in the last 8760 hours.  CBG:  Recent Labs Lab 04/11/15 1005  GLUCAP 107*    Radiological Exams on Admission: Ct Head Wo Contrast  04/11/2015  CLINICAL DATA:  Code stroke.  Sudden onset right arm weakness EXAM: CT HEAD WITHOUT CONTRAST TECHNIQUE: Contiguous axial images were obtained from the base of the skull through the vertex without intravenous contrast. COMPARISON:  03/22/2012 FINDINGS: Old right occipital infarct. Mild chronic small vessel disease throughout the deep white matter. No acute intracranial abnormality. Specifically, no hemorrhage, hydrocephalus, mass lesion, acute infarction, or significant intracranial injury. No acute calvarial abnormality. Visualized paranasal sinuses and mastoids clear. Orbital soft tissues unremarkable. IMPRESSION: Old right occipital infarct, stable since 2014. No acute intracranial abnormality. Atrophy, chronic microvascular disease. Critical Value/emergent results were called by telephone at the time of interpretation on 04/11/2015 at 9:56 am to Dr. Leonel Ramsay , who verbally acknowledged these results. Electronically Signed   By: Rolm Baptise M.D.   On: 04/11/2015 09:57    EKG: Independently reviewed. Sinus rhythm Multiform ventricular premature complexes Left bundle branch block No significant change was found  Assessment/Plan Principal Problem:   Stroke-like symptoms Active Problems:   COPD (chronic obstructive pulmonary disease) (H[facility])   Hypertension   Chronic combined systolic and diastolic congestive heart failure ([facility])   H/O: hypothyroidism   Overweight (BMI 25.0-29.9)   Dysarthria  #1. Strokelike symptoms/right upper extremity weakness/dysarthria. CT of the head with an old right infarct no acute intracranial abnormality. Risk factors retention, CHF, obesity. Symptoms  improving on admission. Evaluated by neurology who opined likely small lacunar infarct. Initially code stroke was called and she was offered TPA which she declined. -Admit to telemetry. - Will obtain MRI/MRA of the brain, echocardiogram, 2-D echo -MRA of the neck evaluate goiter -Chest x-ray -Hemoglobin A1c lipid panel -Obtain swallow eval -Speech therapy occupational therapy physical therapy -Aspirin -Statin -Hold home lisinopril and Lasix for now  #2. Hypertension. Fair control in the emergency department. Home medications include Lasix and lisinopril. -We'll hold home medications  for now allow for permissive hypertension  3. Chronic combined systolic and diastolic failure. Echo last year revealed EF of 55%. Home meds include Lasix. Currently compensated -Daily weights -Intake and output -Resume home meds when indicated  #4. COPD. Appears stable at baseline. Not on home oxygen.. Chest x-ray pending  #5. Hypothyroidism. History of same. -Obtain a TSH   Code Status: DNR DVT Prophylaxis: Family Communication: daughter at bedside Disposition Plan: home hopefully 24 hours  Time spent: 34 minutes  [facility]

## 2015-04-11 NOTE — ED Notes (Signed)
Pt transported to MRI 

## 2015-04-11 NOTE — ED Notes (Signed)
Dr. Marily Memos at bedside talking to family.

## 2015-04-12 ENCOUNTER — Observation Stay (HOSPITAL_BASED_OUTPATIENT_CLINIC_OR_DEPARTMENT_OTHER): Payer: Medicare Other

## 2015-04-12 ENCOUNTER — Observation Stay (HOSPITAL_COMMUNITY): Payer: Medicare Other

## 2015-04-12 ENCOUNTER — Observation Stay (HOSPITAL_COMMUNITY)
Admit: 2015-04-12 | Discharge: 2015-04-12 | Disposition: A | Discharge status: Home / Self Care | Payer: Medicare Other | Attending: Internal Medicine | Admitting: Internal Medicine

## 2015-04-12 DIAGNOSIS — J439 Emphysema, unspecified: Secondary | ICD-10-CM | POA: Diagnosis not present

## 2015-04-12 DIAGNOSIS — I6330 Cerebral infarction due to thrombosis of unspecified cerebral artery: Secondary | ICD-10-CM

## 2015-04-12 DIAGNOSIS — I10 Essential (primary) hypertension: Secondary | ICD-10-CM | POA: Diagnosis not present

## 2015-04-12 DIAGNOSIS — I639 Cerebral infarction, unspecified: Secondary | ICD-10-CM | POA: Diagnosis not present

## 2015-04-12 DIAGNOSIS — Z8639 Personal history of other endocrine, nutritional and metabolic disease: Secondary | ICD-10-CM

## 2015-04-12 DIAGNOSIS — I6789 Other cerebrovascular disease: Secondary | ICD-10-CM | POA: Diagnosis not present

## 2015-04-12 DIAGNOSIS — R2990 Unspecified symptoms and signs involving the nervous system: Secondary | ICD-10-CM | POA: Insufficient documentation

## 2015-04-12 DIAGNOSIS — I5042 Chronic combined systolic (congestive) and diastolic (congestive) heart failure: Secondary | ICD-10-CM | POA: Diagnosis not present

## 2015-04-12 LAB — HEMOGLOBIN A1C
HEMOGLOBIN A1C: 5.7 % — AB (ref 4.8–5.6)
Mean Plasma Glucose: 117 mg/dL

## 2015-04-12 LAB — ECHOCARDIOGRAM COMPLETE
Height: 63 in
Weight: 2822.4 oz

## 2015-04-12 LAB — T3, FREE: T3, Free: 2.6 pg/mL (ref 2.0–4.4)

## 2015-04-12 MED ORDER — ALBUTEROL SULFATE (2.5 MG/3ML) 0.083% IN NEBU
2.5000 mg | INHALATION_SOLUTION | Freq: Once | RESPIRATORY_TRACT | Status: AC
  Administered 2015-04-12: 2.5 mg via RESPIRATORY_TRACT
  Filled 2015-04-12: qty 3

## 2015-04-12 MED ORDER — ATORVASTATIN CALCIUM 40 MG PO TABS
40.0000 mg | ORAL_TABLET | Freq: Every day | ORAL | Status: DC
  Administered 2015-04-12 – 2015-04-14 (×3): 40 mg via ORAL
  Filled 2015-04-12 (×3): qty 1

## 2015-04-12 MED ORDER — BENZONATATE 100 MG PO CAPS
100.0000 mg | ORAL_CAPSULE | Freq: Three times a day (TID) | ORAL | Status: DC
  Administered 2015-04-12 – 2015-04-14 (×5): 100 mg via ORAL
  Filled 2015-04-12 (×5): qty 1

## 2015-04-12 MED ORDER — CLOPIDOGREL BISULFATE 75 MG PO TABS
75.0000 mg | ORAL_TABLET | Freq: Every day | ORAL | Status: DC
  Administered 2015-04-14: 75 mg via ORAL
  Filled 2015-04-12: qty 1

## 2015-04-12 NOTE — Progress Notes (Signed)
OT Cancellation Note  Patient Details Name: [name] MRN: [id number] DOB: 06-20-1925   Cancelled Treatment:    Reason Eval/Treat Not Completed: Patient at procedure or test/ unavailable. Just called floor and pt is still down for EEG, will check back at later time as schedule allows.  Almon Register [id number] 04/12/2015, 12:35 PM

## 2015-04-12 NOTE — Care Management Note (Signed)
Case Management Note  Patient Details  Name: [name] MRN: [id number] Date of Birth: 12-20-1925  Subjective/Objective:                    Action/Plan: Patient presented with stroke-like symptoms. Will follow for discharge needs pending PT/OT evals and physician orders.  Expected Discharge Date:                  Expected Discharge Plan:     In-House Referral:     Discharge planning Services     Post Acute Care Choice:    Choice offered to:     DME Arranged:    DME Agency:     HH Arranged:    HH Agency:     Status of Service:  In process, will continue to follow  Medicare Important Message Given:    Date Medicare IM Given:    Medicare IM give by:    Date Additional Medicare IM Given:    Additional Medicare Important Message give by:     If discussed at [place] of Stay Meetings, dates discussed:    Additional CommentsRolm Baptise, RN 04/12/2015, 2:24 PM [phone]

## 2015-04-12 NOTE — Evaluation (Signed)
Physical Therapy Evaluation Patient Details Name: ALEXINE HALLAM MRN: [id number] DOB: 1925-03-27 Today's Date: 04/12/2015   History of Present Illness  Presented with Rt hand weakness. Initial MRI stated no acute infarct, however re-examined 4/11 with Lt pre-central subacute to acute "insult" PMHx-HTN, CHF, COPD, abdominal abscess, colostomy and takedown.   Clinical Impression  Pt admitted with above diagnosis. Pt currently with functional limitations due to the deficits listed below (see PT Problem List). Patient lives alone and could benefit from further balance testing/challenges. Pt may benefit from skilled PT to increase their independence and safety with mobility to allow discharge to the venue listed below.       Follow Up Recommendations No PT follow up    Equipment Recommendations  None recommended by PT    Recommendations for Other Services OT consult     Precautions / Restrictions Precautions Precautions: Fall      Mobility  Bed Mobility                  Transfers Overall transfer level: Needs assistance Equipment used: Rolling walker (2 wheeled) Transfers: Sit to/from Stand Sit to Stand: Min assist         General transfer comment: Pt accustomed to a locking 4 wheel walker and tried to come to stand in same manner with the 2 wheel RW. Required assist to stabilize RW during transfers  Ambulation/Gait Ambulation/Gait assistance: Min guard Ambulation Distance (Feet): 140 Feet Assistive device: Rolling walker (2 wheeled) Gait Pattern/deviations: Step-through pattern;Decreased stride length;Trunk flexed   Gait velocity interpretation: at or above normal speed for age/gender General Gait Details: kyphotic posture and pushes RW too far ahead of her; able to maneuver RW through tight spaces  Stairs            Wheelchair Mobility    Modified Rankin (Stroke Patients Only) Modified Rankin (Stroke Patients Only) Pre-Morbid Rankin Score: Moderate  disability Modified Rankin: Moderately severe disability     Balance Overall balance assessment: Needs assistance Sitting-balance support: No upper extremity supported;Feet supported Sitting balance-Leahy Scale: Fair     Standing balance support: No upper extremity supported Standing balance-Leahy Scale: Fair Standing balance comment: during pericare in semi-standing                             Pertinent Vitals/Pain Pain Assessment: No/denies pain    Home Living Family/patient expects to be discharged to:: Private residence Living Arrangements: Alone Available Help at Discharge: Family;Available PRN/intermittently Type of Home: House Home Access: Stairs to enter   Entrance Stairs-Number of Steps: 1 Home Layout: One level Home Equipment: Grab bars - toilet;Tub bench;Walker - 4 wheels;Bedside commode;Grab bars - tub/shower;Hand held shower head;Cane - single point (borrows daughter's RW at times; home O2)      Prior Function Level of Independence: Needs assistance   Gait / Transfers Assistance Needed: uses rollator in home mostly; sometimes uses RW when out; can walk "a little" when goes to grocery store with family  ADL's / Homemaking Assistance Needed: family assists with groceries and laundry, etc; does not drive  Comments: uses rollator in home most; sometimes uses RW when out     Hand Dominance   Dominant Hand: Right    Extremity/Trunk Assessment   Upper Extremity Assessment: Defer to OT evaluation (able to grip and maneuver RW with Rt hand)           Lower Extremity Assessment: Generalized weakness (reports h/o arthritis with  some days "worse" than today)      Cervical / Trunk Assessment: Kyphotic  Communication   Communication: No difficulties  Cognition Arousal/Alertness: Awake/alert Behavior During Therapy: WFL for tasks assessed/performed Overall Cognitive Status: Within Functional Limits for tasks assessed                       General Comments General comments (skin integrity, edema, etc.): Daughter present    Exercises        Assessment/Plan    PT Assessment Patient needs continued PT services  PT Diagnosis Difficulty walking   PT Problem List Decreased balance;Decreased mobility;Decreased knowledge of use of DME;Obesity  PT Treatment Interventions DME instruction;Gait training;Stair training;Functional mobility training;Therapeutic activities;Balance training;Cognitive remediation;Patient/family education   PT Goals (Current goals can be found in the Care Plan section) Acute Rehab PT Goals Patient Stated Goal: regain use of Rt hand PT Goal Formulation: With patient Time For Goal Achievement: 04/15/15 Potential to Achieve Goals: Good    Frequency Min 3X/week   Barriers to discharge Decreased caregiver support      Co-evaluation               End of Session Equipment Utilized During Treatment: Gait belt;Oxygen Activity Tolerance: Patient tolerated treatment well Patient left: in bed;with call bell/phone within reach;with family/visitor present;Other (comment) (SLP present) Nurse Communication: Mobility status    Functional Assessment Tool Used: clinical judgement Functional Limitation: Mobility: Walking and moving around Mobility: Walking and Moving Around Current Status [phone]): At least 1 percent but less than 20 percent impaired, limited or restricted Mobility: Walking and Moving Around Goal Status [phone]): At least 1 percent but less than 20 percent impaired, limited or restricted    Time: 1349-1430 PT Time Calculation (min) (ACUTE ONLY): 41 min   Charges:   PT Evaluation $PT Eval Low Complexity: 1 Procedure PT Treatments $Gait Training: 23-37 mins   PT G Codes:   PT G-Codes **NOT FOR INPATIENT CLASS** Functional Assessment Tool Used: clinical judgement Functional Limitation: Mobility: Walking and moving around Mobility: Walking and Moving Around Current Status [id number]):  At least 1 percent but less than 20 percent impaired, limited or restricted Mobility: Walking and Moving Around Goal Status [phone]): At least 1 percent but less than 20 percent impaired, limited or restricted    Stony Stegmann 04/12/2015, 3:11 PM Pager [phone]

## 2015-04-12 NOTE — Progress Notes (Signed)
PT Cancellation Note  Patient Details Name: [name] MRN: [id number] DOB: August 25, 1925   Cancelled Treatment:    Reason Eval/Treat Not Completed: Evaluation initiated and patient wanted to eat lunch (understandably). Will return as pt available   [name] 04/12/2015, 2:08 PM Pager [phone]

## 2015-04-12 NOTE — Progress Notes (Signed)
VASCULAR LAB PRELIMINARY  PRELIMINARY  PRELIMINARY  PRELIMINARY  Carotid duplex completed.    Preliminary report:  Bilateral:  1-39% ICA stenosis.  Vertebral artery flow is antegrade.      Caniyah Murley, RVT 04/12/2015, 2:20 PM

## 2015-04-12 NOTE — Progress Notes (Signed)
EEG Completed; Results Pending  

## 2015-04-12 NOTE — Progress Notes (Addendum)
STROKE TEAM PROGRESS NOTE   HPI: [name] is a 82 y.o. female with a history of hypertension, squamous cell carcinoma who presents with right hand numbness and weakness that started suddenly around 7:45 AM. She states that she initially noticed it when she went to cook something and her hand was not working the way that it should. She has chronic problems with her right shoulder and this is unchanged, she does not feel weak more proximally, but she has very little use of her right hand and this is new. She also notes that she is slurring her speech.  Her NIH was 1, however due to the dysfunction of her dominant hand I did discuss IV TPA with the patient and offered to her. After hearing the discussion of the risks and benefits of IV TPA, the patient declined.  SUBJECTIVE (INTERVAL HISTORY) Her husband is at the bedside.  Overall she feels her condition is unchanged. She stated that she had acute onset right hand weakness, without other change from baseline. She has prior right shoulder surgery with chronic limited ROM at shoulder and elbow. The right hand weakness was new.    OBJECTIVE Temp:  [97.7 F (36.5 C)-98 F (36.7 C)] 97.9 F (36.6 C) (04/11 0928) Pulse Rate:  [55-71] 56 (04/11 0928) Cardiac Rhythm:  [-] Normal sinus rhythm (04/11 0700) Resp:  [20-22] 22 (04/11 0928) BP: (134-173)/(51-93) 135/51 mmHg (04/11 0928) SpO2:  [95 %-100 %] 97 % (04/11 1058)   Recent Labs Lab 04/11/15 1005  GLUCAP 107*    Recent Labs Lab 04/11/15 0950 04/11/15 0951  NA 143 144  K 3.9 3.8  CL 105 103  CO2 27  --   GLUCOSE 109* 106*  BUN 19 22*  CREATININE 0.96 0.90  CALCIUM 9.0  --     Recent Labs Lab 04/11/15 0950  AST 18  ALT 12*  ALKPHOS 61  BILITOT 0.8  PROT 6.6  ALBUMIN 3.7    Recent Labs Lab 04/11/15 0950 04/11/15 0951  WBC 5.5  --   NEUTROABS 3.7  --   HGB 12.0 12.6  HCT 35.7* 37.0  MCV 94.4  --   PLT 242  --    No results for input(s): CKTOTAL, CKMB,  CKMBINDEX, TROPONINI in the last 168 hours.  Recent Labs  04/11/15 0950  LABPROT 14.8  INR 1.14    Recent Labs  04/11/15 1839  COLORURINE YELLOW  LABSPEC 1.025  PHURINE 6.0  GLUCOSEU NEGATIVE  HGBUR NEGATIVE  BILIRUBINUR NEGATIVE  KETONESUR NEGATIVE  PROTEINUR 30*  NITRITE NEGATIVE  LEUKOCYTESUR SMALL*       Component Value Date/Time   CHOL 176 04/11/2015 1704   TRIG 130 04/11/2015 1704   HDL 40* 04/11/2015 1704   CHOLHDL 4.4 04/11/2015 1704   VLDL 26 04/11/2015 1704   LDLCALC 110* 04/11/2015 1704   Lab Results  Component Value Date   HGBA1C 5.7* 04/11/2015      Component Value Date/Time   LABOPIA NONE DETECTED 04/11/2015 1839   COCAINSCRNUR NONE DETECTED 04/11/2015 1839   LABBENZ NONE DETECTED 04/11/2015 1839   AMPHETMU NONE DETECTED 04/11/2015 1839   THCU NONE DETECTED 04/11/2015 1839   LABBARB NONE DETECTED 04/11/2015 1839     Recent Labs Lab 04/11/15 0950  ETH <5    I have personally reviewed the radiological images below and agree with the radiology interpretations.  Dg Chest 2 View  04/11/2015  IMPRESSION: No acute abnormalities.  Emphysema.    Ct Head Wo  Contrast  04/11/2015  IMPRESSION: Old right occipital infarct, stable since 2014. No acute intracranial abnormality. Atrophy, chronic microvascular disease.   Mr Brain Wo Contrast  04/12/2015  ADDENDUM REPORT: 04/12/2015 12:40 ADDENDUM: We were asked by Neurology to review asymmetry of signal on diffusion sequences in the LEFT precentral cortex with regard to the patient's acute RIGHT hand weakness. Low-level increased signal on DWI axial appears facilitated on ADC in the cortex, but could be mildly restricted in the subcortical white matter. Correlate clinically for an acute versus subacute insult. Electronically Signed   By: Staci Righter M.D.   On: 04/12/2015 12:40  04/12/2015  IMPRESSION: Atrophy and small vessel disease.  No acute intracranial findings. No significant intracranial large vessel  stenosis or occlusion. Electronically Signed: By: Staci Righter M.D. On: 04/11/2015 13:57   Carotid Doppler  pending  2D Echocardiogram  pending   PHYSICAL EXAM  Temp:  [97.7 F (36.5 C)-98 F (36.7 C)] 97.9 F (36.6 C) (04/11 0928) Pulse Rate:  [55-71] 56 (04/11 0928) Resp:  [20-22] 22 (04/11 0928) BP: (134-173)/(51-93) 135/51 mmHg (04/11 0928) SpO2:  [95 %-100 %] 97 % (04/11 1058)  General - Well nourished, well developed, in no apparent distress.  Ophthalmologic - Sharp disc margins OU.  Cardiovascular - Regular rate and rhythm with no murmur.  Mental Status -  Level of arousal and orientation to time, place, and person were intact. Language including expression, naming, repetition, comprehension was assessed and found intact. Fund of Knowledge was assessed and was intact.  Cranial Nerves II - XII - II - Visual field intact OU. III, IV, VI - Extraocular movements intact. V - Facial sensation intact bilaterally. VII - Facial movement intact bilaterally. VIII - Hearing & vestibular intact bilaterally. X - Palate elevates symmetrically. XI - Chin turning & shoulder shrug intact bilaterally. XII - Tongue protrusion intact  Motor Strength - The patient's strength was normal in all extremities except right hand grip 3+/5 and decreased dexterity, right shoulder and right elbow limited ROM and pronator drift was absent.  Bulk was normal and fasciculations were absent.   Motor Tone - Muscle tone was assessed at the neck and appendages and was normal.  Reflexes - The patient's reflexes were symmetrical in all extremities and she had no pathological reflexes.  Sensory - Light touch, temperature/pinprick were assessed and were symmetrical.    Coordination - The patient had normal movements in the hands and feet with no ataxia or dysmetria.  Tremor was absent.  Gait and Station - deferred to PT in room.   ASSESSMENT/PLAN [name] is a 82 y.o. female with history of  HTN, squamous cell carcinoma, and right shoulder surgery admitted for right hand weakness. Symptoms stable.    Stroke: left pre-central gyrus small patchy infarct, embolic pattern secondary to unknown source. However, pt also has old right occipital cortical infart on MRI, further support cardioembolic source  MRI  Acute left pre-central gyrus small infarct and old right MCA/PCA infarct  MRA  unremarkable  Carotid Doppler  pending  2D Echo  Pending  Due to concerns for cardioembolic infarct, recommend TEE and possible loop recorder  LDL 110  HgbA1c 5.7  lovenox for VTE prophylaxis  Diet Heart Room service appropriate?: Yes; Fluid consistency:: Thin   aspirin 81 mg daily prior to admission, now on aspirin 325 mg daily. Recommend to change to plavix for stroke prevention.   Patient counseled to be compliant with her antithrombotic medications  Ongoing aggressive  stroke risk factor management  Therapy recommendations:  pending  Disposition:  pending  Hypertension  Home meds:   lisinopril Permissive hypertension (OK if <220/120) for 24-48 hours post stroke and then gradually normalized within 5-7 days. Currently on none  Stable  Patient counseled to be compliant with her blood pressure medications  Hyperlipidemia  Home meds:  none   Currently on lipitor 40  LDL 110, goal < 70  Continue statin at discharge  Other Stroke Risk Factors  Advanced age  Obesity, Body mass index is 31.26 kg/(m^2).   Hx stroke/TIA - MRI showed silent right MCA/PCA infarct  Other Active Problems  COPD  Other Pertinent History  squamous cell carcinoma  Right shoulder surgery - right shoulder and elbow limited ROM  Hospital day # 1   Rosalin Hawking, MD PhD Stroke Neurology 04/12/2015 2:01 PM    To contact Stroke Continuity provider, please refer to [email or web address] After hours, contact General Neurology

## 2015-04-12 NOTE — Procedures (Signed)
History: 82 year old female with right hand dysfunction  Sedation: None  Technique: This is a 21 channel routine scalp EEG performed at the bedside with bipolar and monopolar montages arranged in accordance to the international 10/20 system of electrode placement. One channel was dedicated to EKG recording.    Background: The background consists of intermixed alpha and beta activities. There is a well defined posterior dominant rhythm of 9 Hz that attenuates with eye opening. Sleep is recorded with normal appearing structures.   Photic stimulation: Physiologic driving is not performed  EEG Abnormalities: None  Clinical Interpretation: This normal EEG is recorded in the waking and sleep state. There was no seizure or seizure predisposition recorded on this study. Please note that a normal EEG does not preclude the possibility of epilepsy.   Roland Rack, MD [facility] [phone]  If 7pm- 7am, please page neurology on call as listed in [place].

## 2015-04-12 NOTE — Progress Notes (Signed)
TEE information given. Patient request to have the consent sign when her daughter present due to her right hand unable to grip and sign at this time.  Ave Filter, RN

## 2015-04-12 NOTE — Progress Notes (Addendum)
SLP Cancellation Note  Patient Details Name: [name] MRN: [id number] DOB: November 21, 1925   Cancelled treatment:       Reason Eval/Treat Not Completed: SLP screened, no needs identified, will sign off.  Patient and family in agreement that patient is at baseline.    Gunnar Fusi, M.A., CCC-SLP [phone]  [place] 04/12/2015, 2:56 PM

## 2015-04-12 NOTE — Evaluation (Signed)
Occupational Therapy Evaluation Patient Details Name: [name] MRN: [id number] DOB: 1925/05/25 Today's Date: 04/12/2015    History of Present Illness Admitted with Rt arm weakness. Pt with Acute left pre-central gyrus small infarct and old right MCA/PCA infarct. PMHx-HTN, CHF, chronic right shoulder problems, squamous cell carcinoma, hypothyroidism, anemia, COPD, abdominal abscess, colostomy and takedown.    Clinical Impression   Pt admitted with above. Pt independent with ADLs, PTA. Feel pt will benefit from acute OT to increase independence and to address Rt UE prior to d/c. Recommending Outpatient OT at this time.    Follow Up Recommendations  Outpatient OT;Supervision - Intermittent    Equipment Recommendations  None recommended by OT    Recommendations for Other Services       Precautions / Restrictions Precautions Precautions: Fall Restrictions Weight Bearing Restrictions: No      Mobility Bed Mobility Overal bed mobility: Needs Assistance Bed Mobility: Supine to Sit     Supine to sit: Min assist     General bed mobility comments: assist with trunk to come to sitting position.  Transfers Overall transfer level: Needs assistance Transfers: Sit to/from Stand Sit to Stand: Supervision            Balance Unsteady with ambulation-Min guard given.                         ADL Overall ADL's : Needs assistance/impaired     Grooming: Minimal assistance;Sitting               Lower Body Dressing: Minimal assistance;Sit to/from stand   Toilet Transfer: Min guard;Ambulation (sit to stand from bed)   Toileting- Clothing Manipulation and Hygiene: Minimal assistance;Sit to/from stand       Functional mobility during ADLs: Min guard General ADL Comments: Educated on UB dressing technique and suggested button up shirts. Encouraged pt to be using Rt hand. Educated on energy conservation. Educated on energy conservation.     Vision Pt wears  glasses; Reports no change from baseline Vision Assessment?: Yes Visual Fields: No apparent deficits   Perception     Praxis      Pertinent Vitals/Pain Pain Assessment: 0-10 Pain Score: 8  Pain Location: Rt shoulder with shoulder flexion Pain Intervention(s): Monitored during session     Hand Dominance Right   Extremity/Trunk Assessment Upper Extremity Assessment Upper Extremity Assessment: RUE deficits/detail RUE Deficits / Details: limited AROM/ROM shoulder flexion at baseline-pain with shoulder flexion; weak grip strength  RUE Sensation: decreased light touch RUE Coordination: decreased fine motor   Lower Extremity Assessment Lower Extremity Assessment: Defer to PT evaluation     Communication Communication Communication: No difficulties   Cognition Arousal/Alertness: Awake/alert Behavior During Therapy: WFL for tasks assessed/performed Overall Cognitive Status: Within Functional Limits for tasks assessed                     General Comments       Exercises Exercises: Other exercisesEducated on fine motor tasks she can be doing with Rt hand. Other Exercises Other Exercises: Educated on fine motor tasks she can be doing with Rt hand.   Shoulder Instructions      Home Living Family/patient expects to be discharged to:: Private residence Living Arrangements: Alone Available Help at Discharge: Family;Available PRN/intermittently Type of Home: House Home Access: Stairs to enter Entrance Stairs-Number of Steps: 1   Home Layout: One level     Bathroom Shower/Tub: Tub/shower unit   [employer]  Toilet: Handicapped height Bathroom Accessibility: Yes   Home Equipment: Grab bars - toilet;Tub bench;Walker - 4 wheels;Bedside commode;Grab bars - tub/shower;Hand held shower head;Cane - single point (borrows daughter's RW at times; home O2)          Prior Functioning/Environment Level of Independence: Needs assistance  Gait / Transfers Assistance Needed:  uses rollator in home mostly; sometimes uses RW when out; can walk "a little" when goes to grocery store with family ADL's / Homemaking Assistance Needed: family assists with groceries and laundry and pt gets assist with cooking and cleaning; does not drive   Comments: uses rollator in home most; sometimes uses RW when out    OT Diagnosis: Generalized weakness   OT Problem List: Decreased strength;Decreased range of motion;Decreased activity tolerance;Pain;Impaired UE functional use;Impaired sensation;Decreased knowledge of precautions;Decreased knowledge of use of DME or AE;Impaired balance (sitting and/or standing);Decreased coordination   OT Treatment/Interventions: Self-care/ADL training;Therapeutic exercise;DME and/or AE instruction;Therapeutic activities;Patient/family education;Balance training    OT Goals(Current goals can be found in the care plan section) Acute Rehab OT Goals Patient Stated Goal: not stated OT Goal Formulation: With patient/family Time For Goal Achievement: 04/19/15 Potential to Achieve Goals: Good ADL Goals Pt Will Perform Grooming: with set-up;standing Pt Will Perform Lower Body Dressing: with set-up;with supervision;sit to/from stand Pt Will Transfer to Toilet: with supervision;ambulating;with set-up Additional ADL Goal #1: Pt will independently perform HEP for Rt hand to increase strength and coordination. Additional ADL Goal #2: Pt will independently verbalize 3 energy conservation techniques and utilize in session as needed.  OT Frequency: Min 2X/week   Barriers to D/C:            Co-evaluation              End of Session Equipment Utilized During Treatment: Oxygen  Activity Tolerance: Patient tolerated treatment well Patient left: in chair;with chair alarm set;with family/visitor present   Time: 1620-1640 OT Time Calculation (min): 20 min Charges:  OT General Charges $OT Visit: 1 Procedure OT Evaluation $OT Eval Moderate Complexity: 1  Procedure G-Codes: OT G-codes **NOT FOR INPATIENT CLASS** Functional Assessment Tool Used: clinical judgment Functional Limitation: Self care Self Care Current Status [id number]): At least 20 percent but less than 40 percent impaired, limited or restricted Self Care Goal Status [id number]): At least 1 percent but less than 20 percent impaired, limited or restricted  Benito Mccreedy OTR/L [id number] 04/12/2015, 5:03 PM

## 2015-04-12 NOTE — Progress Notes (Signed)
    [facility] has been requested to perform a transesophageal echocardiogram on [name]  for CVA.  After careful review of history and examination, the risks and benefits of transesophageal echocardiogram have been explained including risks of esophageal damage, perforation (1:10,000 risk), bleeding, pharyngeal hematoma as well as other potential complications associated with conscious sedation including aspiration, arrhythmia, respiratory failure and death. Alternatives to treatment were discussed, questions were answered. Patient is willing to proceed.  BP has been stable with no requirement for pressors. Hgb at 12.0. Platelets 242.  The procedure is scheduled for 04/13/2015 at 1600 with Dr. Acie Fredrickson. Will need to be NPO after midnight (sips with meds). Will need to remain NPO until time of procedure as there is a possibility the time could be moved up.   Erma Heritage, PA-C 04/12/2015 2:56 PM

## 2015-04-12 NOTE — Progress Notes (Signed)
[facility] PROGRESS NOTE  [name] [id number] DOB: February 12, 1925 DOA: 04/11/2015  PCP: Leonard Downing, MD  Brief HPI: 82 year old female with a past medical history of hypertension, COPD, anemia, thyroid goiter presented with right arm weakness and numbness. She was hospitalized for further management.  Past medical history:  Past Medical History  Diagnosis Date  . Hypertension   . COPD (chronic obstructive pulmonary disease) ([place])   . CHF (congestive heart failure) ([place])   . SOB (shortness of breath)     'all the time" (01/02/2012)  . Arthritis     "right shoulder" (01/02/2012)  . Pneumonia 01/02/2012; ? 2009  . GERD (gastroesophageal reflux disease)     occasional  . Anemia     years ago  . Diverticulitis of colon with perforation 01/14/2012    That is post sigmoid resection with [facility] pouch and end colostomy   . CAP (community acquired pneumonia) 01/02/2012    Lower lobe   . Intra-abdominal abscess ([place]) 01/14/2012  . Pneumatosis of intestines 01/14/2012  . UTI (lower urinary tract infection) 01/03/2012  . Bladder mass   . History of kidney stones     X 1  . Difficulty sleeping   . Goiter     radioactive iodine ablation/notes 01/02/2012  . Hypothyroidism     "I have taken Synthroid before" (01/02/2012)   HAS HAD RADIOACTIVE IODINE TX 2 YR S AGO  . Radiation 08/10/13-09/15/13    57 gray to vaginal/bladder mass    Consultants: Neurology  Procedures:  Echocardiogram, carotid Doppler, EEG pending  Antibiotics: None  Subjective: Patient still has weakness in her right hand. Denies any weakness in the right leg. No difficulty with speech.  Objective: Vital Signs  Filed Vitals:   04/12/15 0400 04/12/15 0532 04/12/15 0928 04/12/15 1058  BP: 147/66 156/77 135/51   Pulse: 60 59 56   Temp: 98 F (36.7 C) 97.7 F (36.5 C) 97.9 F (36.6 C)   TempSrc: Oral Oral Oral   Resp: 20 20 22    Height:      Weight:      SpO2: 98% 97% 98% 97%    Intake/Output  Summary (Last 24 hours) at 04/12/15 1241 Last data filed at 04/12/15 0900  Gross per 24 hour  Intake    600 ml  Output      0 ml  Net    600 ml   Filed Weights   04/11/15 0957 04/11/15 0958  Weight: 80.015 kg (176 lb 6.4 oz) 80.015 kg (176 lb 6.4 oz)    General appearance: alert, cooperative, appears stated age and no distress Resp: Scattered wheezes bilaterally Cardio: regular rate and rhythm, S1, S2 normal, no murmur, click, rub or gallop GI: soft, non-tender; bowel sounds normal; no masses,  no organomegaly Extremities: extremities normal, atraumatic, no cyanosis or edema Neurologic: Awake and alert. Oriented 3. 3 out of 5 strength in the right hand. No facial asymmetry. Tongue is midline. Left side is normal. Right lower extremity has normal strength.  Lab Results:  Basic Metabolic Panel:  Recent Labs Lab 04/11/15 0950 04/11/15 0951  NA 143 144  K 3.9 3.8  CL 105 103  CO2 27  --   GLUCOSE 109* 106*  BUN 19 22*  CREATININE 0.96 0.90  CALCIUM 9.0  --    Liver Function Tests:  Recent Labs Lab 04/11/15 0950  AST 18  ALT 12*  ALKPHOS 61  BILITOT 0.8  PROT 6.6  ALBUMIN 3.7   CBC:  Recent Labs Lab 04/11/15 0950 04/11/15 0951  WBC 5.5  --   NEUTROABS 3.7  --   HGB 12.0 12.6  HCT 35.7* 37.0  MCV 94.4  --   PLT 242  --     CBG:  Recent Labs Lab 04/11/15 1005  GLUCAP 107*    No results found for this or any previous visit (from the past 240 hour(s)).    Studies/Results: Dg Chest 2 View  04/11/2015  CLINICAL DATA:  Stroke-like symptoms. EXAM: CHEST  2 VIEW COMPARISON:  02/16/2014 and chest CT dated 02/13/2014 FINDINGS: The heart size and pulmonary vascularity are normal. The lungs are hyperinflated but there are no infiltrates or effusions. Marked osteopenia with accentuation of the thoracic kyphosis. Multinodular goiter with multiple irregular calcified nodules as demonstrated on the prior chest CT dated 02/13/2014. IMPRESSION: No acute  abnormalities.  Emphysema. Electronically Signed   By: Lorriane Shire M.D.   On: 04/11/2015 11:53   Ct Head Wo Contrast  04/11/2015  CLINICAL DATA:  Code stroke.  Sudden onset right arm weakness EXAM: CT HEAD WITHOUT CONTRAST TECHNIQUE: Contiguous axial images were obtained from the base of the skull through the vertex without intravenous contrast. COMPARISON:  03/22/2012 FINDINGS: Old right occipital infarct. Mild chronic small vessel disease throughout the deep white matter. No acute intracranial abnormality. Specifically, no hemorrhage, hydrocephalus, mass lesion, acute infarction, or significant intracranial injury. No acute calvarial abnormality. Visualized paranasal sinuses and mastoids clear. Orbital soft tissues unremarkable. IMPRESSION: Old right occipital infarct, stable since 2014. No acute intracranial abnormality. Atrophy, chronic microvascular disease. Critical Value/emergent results were called by telephone at the time of interpretation on 04/11/2015 at 9:56 am to Dr. Leonel Ramsay , who verbally acknowledged these results. Electronically Signed   By: Rolm Baptise M.D.   On: 04/11/2015 09:57   Mr Brain Wo Contrast  04/11/2015  CLINICAL DATA:  Slurred speech and RIGHT upper extremity weakness earlier today. No tPA was administered. EXAM: MRI HEAD WITHOUT CONTRAST MRA HEAD WITHOUT CONTRAST TECHNIQUE: Multiplanar, multiecho pulse sequences of the brain and surrounding structures were obtained without intravenous contrast. Angiographic images of the head were obtained using MRA technique without contrast. COMPARISON:  CT head earlier today. FINDINGS: MRI HEAD FINDINGS No evidence for acute infarction, hemorrhage, mass lesion, hydrocephalus, or extra-axial fluid. Generalized atrophy. Moderately advanced chronic microvascular ischemic change. Remote RIGHT occipital infarct. No midline abnormality. Hyperostosis frontalis interna. BILATERAL cataract extraction. No sinus or mastoid disease. MRA HEAD  FINDINGS Internal carotid artery is widely patent. Basilar artery widely patent, with vertebrals codominant. No intracranial stenosis or occlusion, except for moderately diseased RIGHT A1 ACA, of no apparent clinical consequence given the robust LEFT anterior cerebral. No visible saccular aneurysm. IMPRESSION: Atrophy and small vessel disease.  No acute intracranial findings. No significant intracranial large vessel stenosis or occlusion. Electronically Signed   By: Staci Righter M.D.   On: 04/11/2015 13:57   Mr Jodene Nam Head/brain Wo Cm  04/11/2015  CLINICAL DATA:  Slurred speech and RIGHT upper extremity weakness earlier today. No tPA was administered. EXAM: MRI HEAD WITHOUT CONTRAST MRA HEAD WITHOUT CONTRAST TECHNIQUE: Multiplanar, multiecho pulse sequences of the brain and surrounding structures were obtained without intravenous contrast. Angiographic images of the head were obtained using MRA technique without contrast. COMPARISON:  CT head earlier today. FINDINGS: MRI HEAD FINDINGS No evidence for acute infarction, hemorrhage, mass lesion, hydrocephalus, or extra-axial fluid. Generalized atrophy. Moderately advanced chronic microvascular ischemic change. Remote RIGHT occipital infarct. No midline abnormality. Hyperostosis frontalis interna.  BILATERAL cataract extraction. No sinus or mastoid disease. MRA HEAD FINDINGS Internal carotid artery is widely patent. Basilar artery widely patent, with vertebrals codominant. No intracranial stenosis or occlusion, except for moderately diseased RIGHT A1 ACA, of no apparent clinical consequence given the robust LEFT anterior cerebral. No visible saccular aneurysm. IMPRESSION: Atrophy and small vessel disease.  No acute intracranial findings. No significant intracranial large vessel stenosis or occlusion. Electronically Signed   By: Staci Righter M.D.   On: 04/11/2015 13:57    Medications:  Scheduled: . aspirin  325 mg Oral Daily  . benzonatate  100 mg Oral TID  .  enoxaparin (LOVENOX) injection  40 mg Subcutaneous Q24H   Continuous: . sodium chloride 50 mL/hr at 04/11/15 2108   [id number], polyvinyl alcohol, senna-docusate, zolpidem  Assessment/Plan:  Principal Problem:   Stroke-like symptoms Active Problems:   COPD (chronic obstructive pulmonary disease) ([facility])   Hypertension   Chronic combined systolic and diastolic congestive heart failure ([place])   H/O: hypothyroidism   Overweight (BMI 25.0-29.9)   Dysarthria   Cerebral thrombosis with cerebral infarction    Acute stroke with Right upper extremity weakness and dysarthria Speech appears to be back to baseline. She still has weakness in the right arm. MRI was read as showing no stroke. However, discussed with Dr. Erlinda Hong. He has reviewed the MRI himself and suspects that there is an acute infarct. He has discussed with radiology and they will amend report. Stroke workup has been initiated. Echocardiogram, carotid Doppler is pending. PT, OT evaluation. Neurology wants to do Embolic workup and a TEE and loop recorder is planned. LDL is 110. Initiate statin. Patient is on aspirin.  Thyroid goiter Patient has a known history of same, however, feels that the swelling is getting worse. Proceed with the ultrasound of the thyroid. MRI of the neck was attempted but could not be completed due to coughing spells.   Cough in a patient with history of COPD Chest x-ray does not show any acute findings. She does have some wheezing bilaterally. We will give her nebulizer treatments, and antitussive agents.  Essential hypertension Monitor blood pressures closely. Permissive hypertension to be allowed.  History of chronic combined systolic and diastolic CHF Reasonably well compensated. Echo from last year showed EF of 55%. She is on diuretics at home. Consider resuming Lasix in the next 24 hours. Repeat echocardiogram is pending.  History of hypothyroidism TSH, free T4 and free T3 are normal. Thyroid  ultrasound as discussed above.   DVT Prophylaxis: Lovenox     Code Status: DNR  Family Communication: Discussed with patient  Disposition Plan: Stroke work up in progress.      [facility]  [facility] Pager [phone] 04/12/2015, 12:41 PM  If 7PM-7AM, please contact night-coverage at [email or web address], password [facility]

## 2015-04-12 NOTE — Care Management Obs Status (Signed)
[facility] NOTIFICATION   Patient Details  Name: [name] MRN: [id number] Date of Birth: 1925/02/23   Medicare Observation Status Notification Given:  Yes (MRI results negative)    Pollie Friar, RN 04/12/2015, 11:01 AM

## 2015-04-12 NOTE — Progress Notes (Signed)
PT Cancellation Note  Patient Details Name: [name] MRN: [id number] DOB: [birth year]   Cancelled Treatment:    Reason Eval/Treat Not Completed: Patient at procedure or test/unavailable. Just beginning nebulizer treatment with RT. Will return   [name] 04/12/2015, 10:59 AM Pager [phone]

## 2015-04-12 NOTE — Progress Notes (Signed)
  Echocardiogram 2D Echocardiogram has been performed.  Jennette Dubin 04/12/2015, 1:38 PM

## 2015-04-13 ENCOUNTER — Observation Stay (HOSPITAL_COMMUNITY): Payer: Medicare Other

## 2015-04-13 ENCOUNTER — Encounter (HOSPITAL_COMMUNITY)
Admission: EM | Disposition: A | Discharge status: Home / Self Care | Payer: Self-pay | Source: Home / Self Care | Attending: Internal Medicine

## 2015-04-13 ENCOUNTER — Encounter (HOSPITAL_COMMUNITY): Payer: Self-pay | Admitting: *Deleted

## 2015-04-13 DIAGNOSIS — R29701 NIHSS score 1: Secondary | ICD-10-CM | POA: Diagnosis present

## 2015-04-13 DIAGNOSIS — I6330 Cerebral infarction due to thrombosis of unspecified cerebral artery: Secondary | ICD-10-CM | POA: Diagnosis not present

## 2015-04-13 DIAGNOSIS — D649 Anemia, unspecified: Secondary | ICD-10-CM | POA: Diagnosis present

## 2015-04-13 DIAGNOSIS — I6349 Cerebral infarction due to embolism of other cerebral artery: Secondary | ICD-10-CM | POA: Diagnosis present

## 2015-04-13 DIAGNOSIS — I447 Left bundle-branch block, unspecified: Secondary | ICD-10-CM | POA: Diagnosis present

## 2015-04-13 DIAGNOSIS — R531 Weakness: Secondary | ICD-10-CM | POA: Diagnosis present

## 2015-04-13 DIAGNOSIS — E669 Obesity, unspecified: Secondary | ICD-10-CM | POA: Diagnosis present

## 2015-04-13 DIAGNOSIS — E785 Hyperlipidemia, unspecified: Secondary | ICD-10-CM | POA: Insufficient documentation

## 2015-04-13 DIAGNOSIS — Z961 Presence of intraocular lens: Secondary | ICD-10-CM | POA: Diagnosis present

## 2015-04-13 DIAGNOSIS — I5042 Chronic combined systolic (congestive) and diastolic (congestive) heart failure: Secondary | ICD-10-CM | POA: Diagnosis not present

## 2015-04-13 DIAGNOSIS — J449 Chronic obstructive pulmonary disease, unspecified: Secondary | ICD-10-CM | POA: Diagnosis present

## 2015-04-13 DIAGNOSIS — E039 Hypothyroidism, unspecified: Secondary | ICD-10-CM | POA: Diagnosis present

## 2015-04-13 DIAGNOSIS — E049 Nontoxic goiter, unspecified: Secondary | ICD-10-CM | POA: Diagnosis present

## 2015-04-13 DIAGNOSIS — J438 Other emphysema: Secondary | ICD-10-CM | POA: Diagnosis not present

## 2015-04-13 DIAGNOSIS — Z9842 Cataract extraction status, left eye: Secondary | ICD-10-CM | POA: Diagnosis not present

## 2015-04-13 DIAGNOSIS — Z9841 Cataract extraction status, right eye: Secondary | ICD-10-CM | POA: Diagnosis not present

## 2015-04-13 DIAGNOSIS — I110 Hypertensive heart disease with heart failure: Secondary | ICD-10-CM | POA: Diagnosis present

## 2015-04-13 DIAGNOSIS — Z6831 Body mass index (BMI) 31.0-31.9, adult: Secondary | ICD-10-CM | POA: Diagnosis not present

## 2015-04-13 DIAGNOSIS — Z66 Do not resuscitate: Secondary | ICD-10-CM | POA: Diagnosis present

## 2015-04-13 DIAGNOSIS — R471 Dysarthria and anarthria: Secondary | ICD-10-CM | POA: Diagnosis present

## 2015-04-13 DIAGNOSIS — Z7982 Long term (current) use of aspirin: Secondary | ICD-10-CM | POA: Diagnosis not present

## 2015-04-13 DIAGNOSIS — I63412 Cerebral infarction due to embolism of left middle cerebral artery: Secondary | ICD-10-CM | POA: Insufficient documentation

## 2015-04-13 DIAGNOSIS — I639 Cerebral infarction, unspecified: Secondary | ICD-10-CM | POA: Diagnosis not present

## 2015-04-13 DIAGNOSIS — I5032 Chronic diastolic (congestive) heart failure: Secondary | ICD-10-CM | POA: Diagnosis present

## 2015-04-13 DIAGNOSIS — I472 Ventricular tachycardia: Secondary | ICD-10-CM | POA: Diagnosis present

## 2015-04-13 DIAGNOSIS — Z87891 Personal history of nicotine dependence: Secondary | ICD-10-CM | POA: Diagnosis not present

## 2015-04-13 DIAGNOSIS — K219 Gastro-esophageal reflux disease without esophagitis: Secondary | ICD-10-CM | POA: Diagnosis present

## 2015-04-13 LAB — CBC
HCT: 32 % — ABNORMAL LOW (ref 36.0–46.0)
Hemoglobin: 10.6 g/dL — ABNORMAL LOW (ref 12.0–15.0)
MCH: 32.1 pg (ref 26.0–34.0)
MCHC: 33.1 g/dL (ref 30.0–36.0)
MCV: 97 fL (ref 78.0–100.0)
PLATELETS: 238 10*3/uL (ref 150–400)
RBC: 3.3 MIL/uL — ABNORMAL LOW (ref 3.87–5.11)
RDW: 12.5 % (ref 11.5–15.5)
WBC: 4.3 10*3/uL (ref 4.0–10.5)

## 2015-04-13 LAB — BASIC METABOLIC PANEL
ANION GAP: 8 (ref 5–15)
BUN: 13 mg/dL (ref 6–20)
CALCIUM: 8.8 mg/dL — AB (ref 8.9–10.3)
CO2: 28 mmol/L (ref 22–32)
CREATININE: 0.83 mg/dL (ref 0.44–1.00)
Chloride: 105 mmol/L (ref 101–111)
Glucose, Bld: 111 mg/dL — ABNORMAL HIGH (ref 65–99)
Potassium: 4.3 mmol/L (ref 3.5–5.1)
Sodium: 141 mmol/L (ref 135–145)

## 2015-04-13 SURGERY — INVASIVE LAB ABORTED CASE

## 2015-04-13 SURGERY — LOOP RECORDER INSERTION
Anesthesia: LOCAL

## 2015-04-13 MED ORDER — MIDAZOLAM HCL 5 MG/ML IJ SOLN
INTRAMUSCULAR | Status: AC
  Filled 2015-04-13: qty 2

## 2015-04-13 MED ORDER — SODIUM CHLORIDE 0.9 % IV SOLN: INTRAVENOUS | Status: DC

## 2015-04-13 MED ORDER — FENTANYL CITRATE (PF) 100 MCG/2ML IJ SOLN
INTRAMUSCULAR | Status: DC | PRN
  Administered 2015-04-13 (×2): 25 ug via INTRAVENOUS

## 2015-04-13 MED ORDER — MIDAZOLAM HCL 10 MG/2ML IJ SOLN
INTRAMUSCULAR | Status: DC | PRN
  Administered 2015-04-13: 1 mg via INTRAVENOUS
  Administered 2015-04-13: 2 mg via INTRAVENOUS
  Administered 2015-04-13: 1 mg via INTRAVENOUS

## 2015-04-13 MED ORDER — FENTANYL CITRATE (PF) 100 MCG/2ML IJ SOLN
INTRAMUSCULAR | Status: AC
  Filled 2015-04-13: qty 2

## 2015-04-13 MED ORDER — BUTAMBEN-TETRACAINE-BENZOCAINE 2-2-14 % EX AERO
INHALATION_SPRAY | CUTANEOUS | Status: DC | PRN
  Administered 2015-04-13: 2 via TOPICAL

## 2015-04-13 NOTE — Interval H&P Note (Signed)
History and Physical Interval Note:  04/13/2015 3:46 PM  [name]  has presented today for surgery, with the diagnosis of stroke  The various methods of treatment have been discussed with the patient and family. After consideration of risks, benefits and other options for treatment, the patient has consented to  Procedure(s) with comments: TRANSESOPHAGEAL ECHOCARDIOGRAM (TEE) (N/A) - Need loop as a surgical intervention .  The patient's history has been reviewed, patient examined, no change in status, stable for surgery.  I have reviewed the patient's chart and labs.  Questions were answered to the patient's satisfaction.     [place], Wonda Cheng

## 2015-04-13 NOTE — Progress Notes (Signed)
STROKE TEAM PROGRESS NOTE   SUBJECTIVE (INTERVAL HISTORY) Her husband is at the bedside.  Overall she feels her condition is unchanged. She stated that she had acute onset right hand weakness, without other change from baseline. She has prior right shoulder surgery with chronic limited ROM at shoulder and elbow. The right hand weakness was new.    OBJECTIVE Temp:  [97.4 F (36.3 C)-99.1 F (37.3 C)] 98 F (36.7 C) (04/12 1731) Pulse Rate:  [39-75] 53 (04/12 1731) Cardiac Rhythm:  [-] Ventricular tachycardia (04/12 0825) Resp:  [16-27] 20 (04/12 1731) BP: (134-218)/(59-114) 140/59 mmHg (04/12 1731) SpO2:  [70 %-100 %] 100 % (04/12 1731)   Recent Labs Lab 04/11/15 1005  GLUCAP 107*    Recent Labs Lab 04/11/15 0950 04/11/15 0951 04/13/15 0547  NA 143 144 141  K 3.9 3.8 4.3  CL 105 103 105  CO2 27  --  28  GLUCOSE 109* 106* 111*  BUN 19 22* 13  CREATININE 0.96 0.90 0.83  CALCIUM 9.0  --  8.8*    Recent Labs Lab 04/11/15 0950  AST 18  ALT 12*  ALKPHOS 61  BILITOT 0.8  PROT 6.6  ALBUMIN 3.7    Recent Labs Lab 04/11/15 0950 04/11/15 0951 04/13/15 0547  WBC 5.5  --  4.3  NEUTROABS 3.7  --   --   HGB 12.0 12.6 10.6*  HCT 35.7* 37.0 32.0*  MCV 94.4  --  97.0  PLT 242  --  238   No results for input(s): CKTOTAL, CKMB, CKMBINDEX, TROPONINI in the last 168 hours.  Recent Labs  04/11/15 0950  LABPROT 14.8  INR 1.14    Recent Labs  04/11/15 1839  COLORURINE YELLOW  LABSPEC 1.025  PHURINE 6.0  GLUCOSEU NEGATIVE  HGBUR NEGATIVE  BILIRUBINUR NEGATIVE  KETONESUR NEGATIVE  PROTEINUR 30*  NITRITE NEGATIVE  LEUKOCYTESUR SMALL*       Component Value Date/Time   CHOL 176 04/11/2015 1704   TRIG 130 04/11/2015 1704   HDL 40* 04/11/2015 1704   CHOLHDL 4.4 04/11/2015 1704   VLDL 26 04/11/2015 1704   LDLCALC 110* 04/11/2015 1704   Lab Results  Component Value Date   HGBA1C 5.7* 04/11/2015      Component Value Date/Time   LABOPIA NONE DETECTED  04/11/2015 1839   COCAINSCRNUR NONE DETECTED 04/11/2015 1839   LABBENZ NONE DETECTED 04/11/2015 1839   AMPHETMU NONE DETECTED 04/11/2015 1839   THCU NONE DETECTED 04/11/2015 1839   LABBARB NONE DETECTED 04/11/2015 1839     Recent Labs Lab 04/11/15 0950  ETH <5    I have personally reviewed the radiological images below and agree with the radiology interpretations.  Dg Chest 2 View  04/11/2015  IMPRESSION: No acute abnormalities.  Emphysema.    Ct Head Wo Contrast  04/11/2015  IMPRESSION: Old right occipital infarct, stable since 2014. No acute intracranial abnormality. Atrophy, chronic microvascular disease.   Mr Brain Wo Contrast  04/12/2015  ADDENDUM REPORT: 04/12/2015 12:40 ADDENDUM: We were asked by Neurology to review asymmetry of signal on diffusion sequences in the LEFT precentral cortex with regard to the patient's acute RIGHT hand weakness. Low-level increased signal on DWI axial appears facilitated on ADC in the cortex, but could be mildly restricted in the subcortical white matter. Correlate clinically for an acute versus subacute insult. Electronically Signed   By: Staci Righter M.D.   On: 04/12/2015 12:40  04/12/2015  IMPRESSION: Atrophy and small vessel disease.  No acute intracranial findings.  No significant intracranial large vessel stenosis or occlusion. Electronically Signed: By: Staci Righter M.D. On: 04/11/2015 13:57   Carotid Doppler  Bilateral: 1-39% ICA stenosis. Vertebral artery flow is antegrade.  2D Echocardiogram  - Compared to a prior study in 2016, the LVEF is improved to  55-60%, there is diastolic dysfunction with elevated LV filling  pressure and moderate pulmonary hypertension with RVSP of 55  mmHg.  EEG - This normal EEG is recorded in the waking and sleep state. There was no seizure or seizure predisposition recorded on this study. Please note that a normal EEG does not preclude the possibility of epilepsy.   TEE - attempted, not tolerating,  aborted   PHYSICAL EXAM  Temp:  [97.4 F (36.3 C)-99.1 F (37.3 C)] 98 F (36.7 C) (04/12 1731) Pulse Rate:  [39-75] 53 (04/12 1731) Resp:  [16-27] 20 (04/12 1731) BP: (134-218)/(59-114) 140/59 mmHg (04/12 1731) SpO2:  [70 %-100 %] 100 % (04/12 1731)  General - Well nourished, well developed, in no apparent distress.  Ophthalmologic - Sharp disc margins OU.  Cardiovascular - Regular rate and rhythm with no murmur.  Mental Status -  Level of arousal and orientation to time, place, and person were intact. Language including expression, naming, repetition, comprehension was assessed and found intact. Fund of Knowledge was assessed and was intact.  Cranial Nerves II - XII - II - Visual field intact OU. III, IV, VI - Extraocular movements intact. V - Facial sensation intact bilaterally. VII - Facial movement intact bilaterally. VIII - Hearing & vestibular intact bilaterally. X - Palate elevates symmetrically. XI - Chin turning & shoulder shrug intact bilaterally. XII - Tongue protrusion intact  Motor Strength - The patient's strength was normal in all extremities except right hand grip 3+/5 and decreased dexterity, right shoulder and right elbow limited ROM and pronator drift was absent.  Bulk was normal and fasciculations were absent.   Motor Tone - Muscle tone was assessed at the neck and appendages and was normal.  Reflexes - The patient's reflexes were symmetrical in all extremities and she had no pathological reflexes.  Sensory - Light touch, temperature/pinprick were assessed and were symmetrical.    Coordination - The patient had normal movements in the hands and feet with no ataxia or dysmetria.  Tremor was absent.  Gait and Station - deferred to PT in room.   ASSESSMENT/PLAN [name] is a 82 y.o. female with history of HTN, squamous cell carcinoma, and right shoulder surgery admitted for right hand weakness. Symptoms stable.    Stroke: left pre-central  gyrus small patchy infarct, embolic pattern secondary to unknown source. However, pt also has old right occipital cortical infart on MRI, further support cardioembolic source  MRI  Acute left pre-central gyrus small infarct and old right MCA/PCA infarct  MRA  unremarkable  Carotid Doppler  Unremarkable.  2D Echo  EF 55-60%, unremarkable  EEG normal  Due to concerns for cardioembolic infarct, recommend 30 day cardiac event monitoring as outpt. Please arrange before discharge.  LDL 110  HgbA1c 5.7  lovenox for VTE prophylaxis  aspirin 81 mg daily prior to admission, now on aspirin 325 mg daily. Recommend to change to plavix for stroke prevention.   Patient counseled to be compliant with her antithrombotic medications  Ongoing aggressive stroke risk factor management  Therapy recommendations:  pending  Disposition:  Pending  Will also consider RESPECT ESUS trial as outpt  Hypertension  Home meds:   lisinopril Permissive hypertension (  OK if <220/120) for 24-48 hours post stroke and then gradually normalized within 5-7 days. Currently on none  Stable  Patient counseled to be compliant with her blood pressure medications  Hyperlipidemia  Home meds:  none   Currently on lipitor 40  LDL 110, goal < 70  Continue statin at discharge  Other Stroke Risk Factors  Advanced age  Obesity, Body mass index is 31.26 kg/(m^2).   Hx stroke/TIA - MRI showed silent right MCA/PCA infarct  Other Active Problems  COPD  Other Pertinent History  squamous cell carcinoma  Right shoulder surgery - right shoulder and elbow limited ROM  Hospital day # 01  Neurology will sign off. Please call with questions. Pt will follow up with Dr[facility] Erlinda [facility] at [facility] in about 2 months. Thanks for the consult.   Rosalin Hawking, MD PhD Stroke Neurology 04/13/2015 5:43 PM    To contact Stroke Continuity provider, please refer to [email or web address] After hours, contact General Neurology

## 2015-04-13 NOTE — H&P (View-Only) (Signed)
ELECTROPHYSIOLOGY CONSULT NOTE  Patient ID: [name] MRN: [id number], DOB/AGE: 1925-06-25   Admit date: 04/11/2015 Date of Consult: 04/13/2015  Primary Physician: Leonard Downing, MD Primary Cardiologist: Dr. Johnsie Cancel (last in 2014) Reason for Consultation: Cryptogenic stroke - 04/11/15; recommendations regarding Implantable Loop Recorder  History of Present Illness [name] was admitted on 04/11/2015 with acute stroke.  They first developed symptoms while at home cooking.  She has chronically bad R shoulder with limited ROM, though the day coming in noted she was unable to pick up her pan/cooking utensil which she has never had problems with before, and some slurred speech as also noted.  Imaging demonstrated left pre-central gyrus small patchy infarct, embolic pattern secondary to unknown source. However, pt also has old right occipital cortical infart on MRI, further support cardioembolic source.  she has undergone workup for stroke including echocardiogram and carotid dopplers.  The patient has been monitored on telemetry which has demonstrated sinus rhythm with no atrial arrhythmias.  Inpatient stroke work-up is to be completed with a TEE.   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  Echocardiogram this admission demonstrated Study Conclusions - Left ventricle: The cavity size was normal. Wall thickness was  increased in a pattern of mild LVH. Systolic function was normal.  The estimated ejection fraction was in the range of 55% to 60%.  Doppler parameters are consistent with abnormal left ventricular  relaxation (grade 1 diastolic dysfunction). The E/e&' ratio is  >15, suggesting elevated LV filling pressure. - Aortic valve: Trileaflet. Sclerosis without stenosis. There was  no regurgitation. - Mitral valve: Calcified annulus.  Mildly thickened leaflets .  There was mild regurgitation. - Left atrium: Moderately dilated at 39 ml/m2. - Tricuspid valve: There was mild regurgitation. - Pulmonary arteries: PA peak pressure: 55 mm Hg (S). - Inferior vena cava: The vessel was dilated. The respirophasic  diameter changes were blunted (< 50%), consistent with elevated  central venous pressure. Impressions: - Compared to a prior study in 2016, the LVEF is improved to  55-60%, there is diastolic dysfunction with elevated LV filling  pressure and moderate pulmonary hypertension with RVSP of 55  mmHg.  04/12/15: Carotid US Carotid duplex completed.  Preliminary report: Bilateral: 1-39% ICA stenosis. Vertebral artery flow is antegrade.  Lab work is reviewed.  Prior to admission, the patient denies chest pain, shortness of breath, dizziness, palpitations, or syncope.  She states she wore a one day monitor >20years ago, can not recall why or if any abnormal findings were noted.  They are recovering from their stroke with disposition pending.  EP has been asked to evaluate for placement of an implantable loop recorder to monitor for atrial fibrillation.     Past Medical History  Diagnosis Date  . Hypertension   . COPD (chronic obstructive pulmonary disease) ([place])   . CHF (congestive heart failure) ([place])   . SOB (shortness of breath)     'all the time" (01/02/2012)  . Arthritis     "right shoulder" (01/02/2012)  . Pneumonia 01/02/2012; ? 2009  . GERD (gastroesophageal reflux disease)     occasional  . Anemia     years ago  . Diverticulitis of colon with perforation 01/14/2012    That is post sigmoid resection with [facility] pouch and end colostomy   . CAP (community acquired pneumonia) 01/02/2012    Lower lobe   .  Intra-abdominal abscess ([place]) 01/14/2012  . Pneumatosis of intestines 01/14/2012  . UTI (lower urinary tract infection) 01/03/2012  . Bladder mass   . History of kidney stones     X 1  .  Difficulty sleeping   . Goiter     radioactive iodine ablation/notes 01/02/2012  . Hypothyroidism     "I have taken Synthroid before" (01/02/2012)   HAS HAD RADIOACTIVE IODINE TX 2 YR S AGO  . Radiation 08/10/13-09/15/13    57 gray to vaginal/bladder mass     Surgical History:  Past Surgical History  Procedure Laterality Date  . Cataract extraction w/ intraocular lens  implant, bilateral  ?1990's    Bil  . Appendectomy  1980's    "when they did hysterectomy" (01/02/2012)  . Abdominal hysterectomy  1980's  . Colostomy revision N/A 03/12/2012    Procedure: COLON RESECTION SIGMOID ;  Surgeon: Ralene Ok, MD;  Location: [place];  Service: General;  Laterality: N/A;  . Colostomy N/A 03/12/2012    Procedure: COLOSTOMY;  Surgeon: Ralene Ok, MD;  Location: [place];  Service: General;  Laterality: N/A;  . Colostomy takedown N/A 10/21/2012    Procedure: LAPAROSCOPIC COLOSTOMY TAKEDOWN AND HARTMANS ANASTOMOSIS, rigid proctoscopy;  Surgeon: Ralene Ok, MD;  Location: [facility];  Service: General;  Laterality: N/A;  . Lysis of adhesion N/A 10/21/2012    Procedure: LYSIS OF ADHESION;  Surgeon: Ralene Ok, MD;  Location: [facility];  Service: General;  Laterality: N/A;  . Transurethral resection of bladder tumor with gyrus (turbt-gyrus) N/A 06/30/2013    Procedure: TRANSURETHRAL RESECTION OF BLADDER TUMOR WITH GYRUS (TURBT-GYRUS)/VAGINAL BIOPSY;  Surgeon: Ardis Hughs, MD;  Location: [facility];  Service: Urology;  Laterality: N/A;  . Eye surgery    . Colon surgery       Prescriptions prior to admission  Medication Sig Dispense Refill Last Dose  . albuterol (PROVENTIL HFA;VENTOLIN HFA) 108 (90 BASE) MCG/ACT inhaler Inhale 2 puffs into the lungs every 6 (six) hours as needed for wheezing.   Past Week at Unknown time  . aspirin 325 MG tablet Take 325 mg by mouth daily.   Past Week at Unknown time  . furosemide (LASIX) 80 MG tablet Take 40 mg by mouth daily.   Past Week at Unknown time  .  lisinopril (PRINIVIL,ZESTRIL) 20 MG tablet Take 10 mg by mouth every morning.    Past Week at Unknown time  . polyvinyl alcohol (LIQUIFILM TEARS) 1.4 % ophthalmic solution Place 1 drop into both eyes as needed for dry eyes.   Past Month at Unknown time  . zolpidem (AMBIEN) 5 MG tablet    Past Month at Unknown time  . acetaminophen (TYLENOL) 500 MG tablet Take 1,000 mg by mouth every 6 (six) hours as needed (Pain). Reported on 01/06/2015   Not Taking  . aspirin 81 MG tablet Take 81 mg by mouth daily. Reported on 01/06/2015   Not Taking  . Guaifenesin (MUCINEX MAXIMUM STRENGTH) 1200 MG TB12 Take 1 tablet (1,200 mg total) by mouth 2 (two) times daily. (Patient not taking: Reported on 07/08/2014) 14 each 0 Not Taking  . magnesium hydroxide (MILK OF MAGNESIA) 400 MG/5ML suspension Take by mouth daily as needed for mild constipation.   Taking    Inpatient Medications:  . atorvastatin  40 mg Oral q1800  . benzonatate  100 mg Oral TID  . clopidogrel  75 mg Oral Daily  . enoxaparin (LOVENOX) injection  40 mg Subcutaneous Q24H    Allergies:  Allergies  Allergen Reactions  . Indomethacin Anaphylaxis and Other (See Comments)    "took it fine for awhile; one day I stopped breathing and I ended up in the hospital" (01/02/2012)    Social History   Social History  . Marital Status: Widowed    Spouse Name: N/A  . Number of Children: 6  . Years of Education: N/A   Occupational History  . retired    Social History Main Topics  . Smoking status: Former Smoker -- 0.12 packs/day for 35 years    Types: Cigarettes    Quit date: 01/02/1991  . Smokeless tobacco: Never Used  . Alcohol Use: No  . Drug Use: No  . Sexual Activity: No   Other Topics Concern  . Not on file   Social History Narrative     Family History  Problem Relation Age of Onset  . Heart disease    . Cancer Sister     Breast  . Stroke Sister   . Heart disease Mother       Review of Systems: All other systems reviewed and are  otherwise negative except as noted above.  Physical Exam: Filed Vitals:   04/12/15 1759 04/12/15 2224 04/13/15 0123 04/13/15 0548  BP:  165/81 134/75 162/72  Pulse: 64 75 75 56  Temp: 98.1 F (36.7 C) 97.6 F (36.4 C) 98.6 F (37 C) 99.1 F (37.3 C)  TempSrc: Oral Oral Oral Oral  Resp: 18 18 16 18   Height:      Weight:      SpO2: 99% 99% 99% 100%    GEN- The patient is elderly, well appearing, alert and oriented x 3 today.   Head- normocephalic, atraumatic Eyes-  Sclera clear, conjunctiva pink Ears- hearing intact Oropharynx- clear Neck- supple Lungs- Clear to ausculation bilaterally, normal work of breathing Heart- Regular rate and rhythm, no murmurs, rubs or gallops  GI- soft, NT, ND Extremities- no clubbing, cyanosis, or edema MS- no significant deformity or atrophy Skin- no rash or lesion Psych- euthymic mood, full affect   Labs:   Lab Results  Component Value Date   WBC 4.3 04/13/2015   HGB 10.6* 04/13/2015   HCT 32.0* 04/13/2015   MCV 97.0 04/13/2015   PLT 238 04/13/2015    Recent Labs Lab 04/11/15 0950  04/13/15 0547  NA 143  < > 141  K 3.9  < > 4.3  CL 105  < > 105  CO2 27  --  28  BUN 19  < > 13  CREATININE 0.96  < > 0.83  CALCIUM 9.0  --  8.8*  PROT 6.6  --   --   BILITOT 0.8  --   --   ALKPHOS 61  --   --   ALT 12*  --   --   AST 18  --   --   GLUCOSE 109*  < > 111*  < > = values in this interval not displayed. Lab Results  Component Value Date   CKTOTAL 54 01/04/2007   CKMB 3.3 01/04/2007   TROPONINI 0.09* 02/15/2014   Lab Results  Component Value Date   CHOL 176 04/11/2015   CHOL 169 04/01/2013   CHOL 114 03/24/2012   Lab Results  Component Value Date   HDL 40* 04/11/2015   HDL 34.90* 04/01/2013   HDL 20* 01/03/2007   Lab Results  Component Value Date   LDLCALC 110* 04/11/2015   LDLCALC 104* 04/01/2013   [place]  01/03/2007  98        Total Cholesterol/HDL:CHD Risk Coronary Heart Disease Risk Table                      Men   Women  1/2 Average Risk   3.4   3.3  Average Risk       5.0   4.4  2 X Average Risk   9.6   7.1  3 X Average Risk  23.4   11.0        Use the calculated Patient Ratio above and the CHD Risk Table to determine the patient's CHD Risk.        ATP III CLASSIFICATION (LDL):  <100     mg/dL   Optimal  100-129  mg/dL   Near or Above                    Optimal  130-159  mg/dL   Borderline  160-189  mg/dL   High  >190     mg/dL   Very High   Lab Results  Component Value Date   TRIG 130 04/11/2015   TRIG 150.0* 04/01/2013   TRIG 89 03/24/2012   Lab Results  Component Value Date   CHOLHDL 4.4 04/11/2015   CHOLHDL 5 04/01/2013   CHOLHDL 6.4 01/03/2007   No results found for: LDLDIRECT  No results found for: DDIMER   Radiology/Studies:  Dg Chest 2 View 04/11/2015  CLINICAL DATA:  Stroke-like symptoms. EXAM: CHEST  2 VIEW COMPARISON:  02/16/2014 and chest CT dated 02/13/2014 FINDINGS: The heart size and pulmonary vascularity are normal. The lungs are hyperinflated but there are no infiltrates or effusions. Marked osteopenia with accentuation of the thoracic kyphosis. Multinodular goiter with multiple irregular calcified nodules as demonstrated on the prior chest CT dated 02/13/2014. IMPRESSION: No acute abnormalities.  Emphysema. Electronically Signed   By: Lorriane Shire M.D.   On: 04/11/2015 11:53   Ct Head Wo Contrast 04/11/2015  CLINICAL DATA:  Code stroke.  Sudden onset right arm weakness EXAM: CT HEAD WITHOUT CONTRAST TECHNIQUE: Contiguous axial images were obtained from the base of the skull through the vertex without intravenous contrast. COMPARISON:  03/22/2012 FINDINGS: Old right occipital infarct. Mild chronic small vessel disease throughout the deep white matter. No acute intracranial abnormality. Specifically, no hemorrhage, hydrocephalus, mass lesion, acute infarction, or significant intracranial injury. No acute calvarial abnormality. Visualized paranasal sinuses and  mastoids clear. Orbital soft tissues unremarkable. IMPRESSION: Old right occipital infarct, stable since 2014. No acute intracranial abnormality. Atrophy, chronic microvascular disease. Critical Value/emergent results were called by telephone at the time of interpretation on 04/11/2015 at 9:56 am to Dr. Leonel Ramsay , who verbally acknowledged these results. Electronically Signed   By: Rolm Baptise M.D.   On: 04/11/2015 09:57   Mr Brain Wo Contrast 04/12/2015  ADDENDUM REPORT: 04/12/2015 12:40 ADDENDUM: We were asked by Neurology to review asymmetry of signal on diffusion sequences in the LEFT precentral cortex with regard to the patient's acute RIGHT hand weakness. Low-level increased signal on DWI axial appears facilitated on ADC in the cortex, but could be mildly restricted in the subcortical white matter. Correlate clinically for an acute versus subacute insult. Electronically Signed   By: Staci Righter M.D.   On: 04/12/2015 12:40 04/12/2015  CLINICAL DATA:  Slurred speech and RIGHT upper extremity weakness earlier today. No tPA was administered. EXAM: MRI HEAD WITHOUT CONTRAST MRA HEAD WITHOUT CONTRAST TECHNIQUE: Multiplanar, multiecho pulse sequences of  the brain and surrounding structures were obtained without intravenous contrast. Angiographic images of the head were obtained using MRA technique without contrast. COMPARISON:  CT head earlier today. FINDINGS: MRI HEAD FINDINGS No evidence for acute infarction, hemorrhage, mass lesion, hydrocephalus, or extra-axial fluid. Generalized atrophy. Moderately advanced chronic microvascular ischemic change. Remote RIGHT occipital infarct. No midline abnormality. Hyperostosis frontalis interna. BILATERAL cataract extraction. No sinus or mastoid disease. MRA HEAD FINDINGS Internal carotid artery is widely patent. Basilar artery widely patent, with vertebrals codominant. No intracranial stenosis or occlusion, except for moderately diseased RIGHT A1 ACA, of no apparent  clinical consequence given the robust LEFT anterior cerebral. No visible saccular aneurysm. IMPRESSION: Atrophy and small vessel disease.  No acute intracranial findings. No significant intracranial large vessel stenosis or occlusion. Electronically Signed: By: Staci Righter M.D. On: 04/11/2015 13:57    US Thyroid 04/13/2015  CLINICAL DATA:  Thyroid goiter.  Prior iodine therapy. EXAM: THYROID ULTRASOUND TECHNIQUE: Ultrasound examination of the thyroid gland and adjacent soft tissues was performed. COMPARISON:  PET-CT 07/14/2013.  Chest CT 02/13/2014 FINDINGS: Right thyroid lobe Measurements: 10.1 x 3.8 x 4.9 cm. The right thyroid tissue is markedly enlarged and diffusely heterogeneous. Morphology of the thyroid tissue is suggestive for complete replacement of the thyroid tissue with multiple nodules. There is a focal calcified lesion in the right thyroid lobe measuring 1.3 x 1.0 x 1.2 cm. There is posterior acoustic shadowing associated with this large calcification. There is a large heterogeneous nodule occupying the superior and lateral aspect of the right thyroid lobe that corresponds with the previously described hypermetabolic lesion on PET-CT. Left thyroid lobe Measurements: 6.1 x 3.5 x 5.2 cm. Left thyroid tissue is diffusely heterogeneous and has same echotexture as the right thyroid lobe. There is a large calcified lesion in the left thyroid lobe measuring 2.9 x 2.4 x 2.3 cm. Large amount of posterior acoustic shadowing associated this large calcified lesion. Areas of cystic degeneration within the left thyroid lobe. Isthmus Thickness: 1.9 cm. Enlarged heterogeneous. Large vascular structure extending through the isthmus. Lymphadenopathy None visualized. IMPRESSION: Multi nodular goiter. Thyroid tissue is markedly enlarged and replaced by multiple heterogeneous nodules. Again noted are two large foci of calcifications which were present on the previous cross-sectional imaging. Prior PET-CT demonstrated  a suspicious 5 cm hypermetabolic nodule involving the superior right thyroid lobe. If thyroid nodule workup is desired, recommend ultrasound-guided aspiration of the large nodule involving the superior right thyroid lobe. Electronically Signed   By: Markus Daft M.D.   On: 04/13/2015 07:50    12-lead ECG SR All prior EKG's in EPIC reviewed with no documented atrial fibrillation  Telemetry SR, PVC's noted, occ couplets and infrequent NSVT 3 beats.  (old CV strips in EPIC reviewed, WCT up to 8beats noted, during a lengthy complicated hospitalization with intrabdominal abscess and EF then 40-45%) no AF  Assessment and Plan:  1. Cryptogenic stroke The patient presents with cryptogenic stroke.  The patient has a TEE planned for later today.  I spoke at length with the patient about monitoring for afib with either a 30 day event monitor or an implantable loop recorder.  Risks, benefits, and alteratives to implantable loop recorder were discussed with the patient today.   At this time, if anything, she thinks she would consider the loop implant but is somewhat hesitant given her age.  She will discuss further with Dr. Caryl Comes  2. Ventricular tachycardia nonsustained   3. Hypertension     Wound care was reviewed  with the patient (keep incision clean and dry for 3 days).  Wound check will be scheduled should she decide to proceed.  Please call with questions.   Renee Dyane Dustman, PA-C 04/13/2015  Pt seen and examined CVA with residual R hand weakness, no hx of afib TEE pending  VT NS is of little neg prognostic value given normal LV function;  Bradycardia precludes uptitration of BB  Hypertension management per Primary team  If TEE is neg, will proceed with loop insertion  Have reviewed risks and benefits with pt and her family as well as the limitations of aspirin therapy for Afib CVA risk reduction.

## 2015-04-13 NOTE — Progress Notes (Addendum)
Patient ID: [name], female   DOB: [birth year], 82 y.o.   MRN: [id number]  [facility] PROGRESS NOTE  CECILEY SAILORS [id number] DOB: 08/24/1925 DOA: 04/11/2015 PCP: Leonard Downing, MD   Brief narrative:    82 y.o. female with known hypertension, COPD, chronic diastolic heart, GERD, diverticulitis, squamous cell carcinoma, who presented to [facility] with sudden onset slurred speech and right upper extremity weakness.   In ED, pt was hemodynamically stable, VSS, given on e dose of Aspirin 325 mg PO and THR asked to admit for further evaluation of ? Stroke.   Assessment/Plan:    Principal Problem:   Cerebral thrombosis with cerebral infarction - left pre-central gyrus small patchy infarct, embolic pattern secondary to unknown source - pt has old right occipital cortical infart on MRI, further support cardioembolic source - MRA unremarkable - ECHO with EF 55 - 60% and grade I diastolic CHF - Due to concerns for cardioembolic infarct, recommend TEE and possible loop recorder, plan to place 4/12 - LDL 110 - HgbA1c 5.7 - was on aspirin 81 mg daily prior to admission, changed to plavix for stroke prevention - will need statin upon discharge   Active Problems:   COPD (chronic obstructive pulmonary disease) ([place]) - respiratory status stable this AM - oxygen saturation at target range     Hypertension, essential  - permissive HTN for now with gradual normalization in the next week - BP currently 160/66 - on lisinopril at home but currently on on antihypertensive med     Chronic combined systolic and diastolic congestive heart failure (H[facility]) - ECHO with improved EF up to 55 - 60% and grade I diastolic CHF - weight 80 kg this AM - monitor     Obesity  - Body mass index is 31.26 kg/(m^2).    Anemia, acute drop in Hg since admission - no signs of bleeding - will repeat again in AM if pt stays additional day   DVT prophylaxis - Lovenox SQ  Code Status: DNR Family  Communication:  plan of care discussed with the patient, pt declined my offer to update family, I have provided pt my cell phone number so that family can get in touch with me for any updates  Disposition Plan: Home when cleared by neurology, ? Today vs 4/13  IV access:  Peripheral IV  Procedures and diagnostic studies:    Dg Chest 2 View 04/11/2015  No acute abnormalities.  Emphysema.  Ct Head Wo Contrast 04/11/2015  Old right occipital infarct, stable since 2014. No acute intracranial abnormality. Atrophy, chronic microvascular disease.   Mr Jodene Nam Head/brain Wo Cm 04/12/2015 Correlate clinically for an acute versus subacute insult. 04/12/2015  Atrophy and small vessel disease.  No acute intracranial findings.   US Thyroid 04/13/2015  Multi nodular goiter. Thyroid tissue is markedly enlarged and replaced by multiple heterogeneous nodules. Again noted are two large foci of calcifications which were present on the previous cross-sectional imaging. Prior PET-CT demonstrated a suspicious 5 cm hypermetabolic nodule involving the superior right thyroid lobe. If thyroid nodule workup is desired, recommend ultrasound-guided aspiration of the large nodule involving the superior right thyroid lobe.  Medical Consultants:  Neurology  Cardiology consulted for TEE which is scheduled for today 4/12 at 16:00  Other Consultants:  PT/OT  IAnti-Infectives:   None   Faye Ramsay, MD  [facility] Pager [phone]  If 7PM-7AM, please contact night-coverage [email or web address] Password [facility] 04/13/2015, 10:11 AM  HPI/Subjective: No events overnight.   Objective: Filed Vitals:  04/12/15 2224 04/13/15 0123 04/13/15 0548 04/13/15 0937  BP: 165/81 134/75 162/72 160/66  Pulse: 75 75 56 57  Temp: 97.6 F (36.4 C) 98.6 F (37 C) 99.1 F (37.3 C) 98.7 F (37.1 C)  TempSrc: Oral Oral Oral Oral  Resp: 18 16 18 20   Height:      Weight:      SpO2: 99% 99% 100% 100%    Intake/Output Summary (Last 24 hours) at  04/13/15 1011 Last data filed at 04/12/15 1230  Gross per 24 hour  Intake    360 ml  Output      0 ml  Net    360 ml    Exam:   General:  Pt is alert, follows commands appropriately, not in acute distress  Cardiovascular: Regular rate and rhythm, no rubs, no gallops  Respiratory: Clear to auscultation bilaterally, no wheezing, no crackles, no rhonchi  Abdomen: Soft, non tender, non distended, bowel sounds present, no guarding  Extremities: No edema, pulses DP and PT palpable bilaterally  Neuro: Grossly non focal except right hand grip 4/5  Data Reviewed: Basic Metabolic Panel:  Recent Labs Lab 04/11/15 0950 04/11/15 0951 04/13/15 0547  NA 143 144 141  K 3.9 3.8 4.3  CL 105 103 105  CO2 27  --  28  GLUCOSE 109* 106* 111*  BUN 19 22* 13  CREATININE 0.96 0.90 0.83  CALCIUM 9.0  --  8.8*   Liver Function Tests:  Recent Labs Lab 04/11/15 0950  AST 18  ALT 12*  ALKPHOS 61  BILITOT 0.8  PROT 6.6  ALBUMIN 3.7   CBC:  Recent Labs Lab 04/11/15 0950 04/11/15 0951 04/13/15 0547  WBC 5.5  --  4.3  NEUTROABS 3.7  --   --   HGB 12.0 12.6 10.6*  HCT 35.7* 37.0 32.0*  MCV 94.4  --  97.0  PLT 242  --  238   CBG:  Recent Labs Lab 04/11/15 1005  GLUCAP 107*   Scheduled Meds: . atorvastatin  40 mg Oral q1800  . benzonatate  100 mg Oral TID  . clopidogrel  75 mg Oral Daily  . enoxaparin (LOVENOX) injection  40 mg Subcutaneous Q24H   Continuous Infusions: . sodium chloride 50 mL/hr at 04/11/15 2108

## 2015-04-13 NOTE — Consult Note (Addendum)
ELECTROPHYSIOLOGY CONSULT NOTE  Patient ID: [name] MRN: [id number], DOB/AGE: [birth year]   Admit date: 04/11/2015 Date of Consult: 04/13/2015  Primary Physician: Leonard Downing, MD Primary Cardiologist: Dr. Johnsie Cancel (last in 2014) Reason for Consultation: Cryptogenic stroke - 04/11/15; recommendations regarding Implantable Loop Recorder  History of Present Illness [name] was admitted on 04/11/2015 with acute stroke.  They first developed symptoms while at home cooking.  She has chronically bad R shoulder with limited ROM, though the day coming in noted she was unable to pick up her pan/cooking utensil which she has never had problems with before, and some slurred speech as also noted.  Imaging demonstrated left pre-central gyrus small patchy infarct, embolic pattern secondary to unknown source. However, pt also has old right occipital cortical infart on MRI, further support cardioembolic source.  she has undergone workup for stroke including echocardiogram and carotid dopplers.  The patient has been monitored on telemetry which has demonstrated sinus rhythm with no atrial arrhythmias.  Inpatient stroke work-up is to be completed with a TEE.   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  Echocardiogram this admission demonstrated Study Conclusions - Left ventricle: The cavity size was normal. Wall thickness was  increased in a pattern of mild LVH. Systolic function was normal.  The estimated ejection fraction was in the range of 55% to 60%.  Doppler parameters are consistent with abnormal left ventricular  relaxation (grade 1 diastolic dysfunction). The E/e&' ratio is  >15, suggesting elevated LV filling pressure. - Aortic valve: Trileaflet. Sclerosis without stenosis. There was  no regurgitation. - Mitral valve: Calcified annulus.  Mildly thickened leaflets .  There was mild regurgitation. - Left atrium: Moderately dilated at 39 ml/m2. - Tricuspid valve: There was mild regurgitation. - Pulmonary arteries: PA peak pressure: 55 mm Hg (S). - Inferior vena cava: The vessel was dilated. The respirophasic  diameter changes were blunted (< 50%), consistent with elevated  central venous pressure. Impressions: - Compared to a prior study in 2016, the LVEF is improved to  55-60%, there is diastolic dysfunction with elevated LV filling  pressure and moderate pulmonary hypertension with RVSP of 55  mmHg.  04/12/15: Carotid US Carotid duplex completed.  Preliminary report: Bilateral: 1-39% ICA stenosis. Vertebral artery flow is antegrade.  Lab work is reviewed.  Prior to admission, the patient denies chest pain, shortness of breath, dizziness, palpitations, or syncope.  She states she wore a one day monitor >20years ago, can not recall why or if any abnormal findings were noted.  They are recovering from their stroke with disposition pending.  EP has been asked to evaluate for placement of an implantable loop recorder to monitor for atrial fibrillation.     Past Medical History  Diagnosis Date  . Hypertension   . COPD (chronic obstructive pulmonary disease) ([place])   . CHF (congestive heart failure) ([place])   . SOB (shortness of breath)     'all the time" (01/02/2012)  . Arthritis     "right shoulder" (01/02/2012)  . Pneumonia 01/02/2012; ? 2009  . GERD (gastroesophageal reflux disease)     occasional  . Anemia     years ago  . Diverticulitis of colon with perforation 01/14/2012    That is post sigmoid resection with [facility] pouch and end colostomy   . CAP (community acquired pneumonia) 01/02/2012    Lower lobe   .  Intra-abdominal abscess ([place]) 01/14/2012  . Pneumatosis of intestines 01/14/2012  . UTI (lower urinary tract infection) 01/03/2012  . Bladder mass   . History of kidney stones     X 1  .  Difficulty sleeping   . Goiter     radioactive iodine ablation/notes 01/02/2012  . Hypothyroidism     "I have taken Synthroid before" (01/02/2012)   HAS HAD RADIOACTIVE IODINE TX 2 YR S AGO  . Radiation 08/10/13-09/15/13    57 gray to vaginal/bladder mass     Surgical History:  Past Surgical History  Procedure Laterality Date  . Cataract extraction w/ intraocular lens  implant, bilateral  ?1990's    Bil  . Appendectomy  1980's    "when they did hysterectomy" (01/02/2012)  . Abdominal hysterectomy  1980's  . Colostomy revision N/A 03/12/2012    Procedure: COLON RESECTION SIGMOID ;  Surgeon: Ralene Ok, MD;  Location: [place];  Service: General;  Laterality: N/A;  . Colostomy N/A 03/12/2012    Procedure: COLOSTOMY;  Surgeon: Ralene Ok, MD;  Location: [place];  Service: General;  Laterality: N/A;  . Colostomy takedown N/A 10/21/2012    Procedure: LAPAROSCOPIC COLOSTOMY TAKEDOWN AND HARTMANS ANASTOMOSIS, rigid proctoscopy;  Surgeon: Ralene Ok, MD;  Location: [facility];  Service: General;  Laterality: N/A;  . Lysis of adhesion N/A 10/21/2012    Procedure: LYSIS OF ADHESION;  Surgeon: Ralene Ok, MD;  Location: [facility];  Service: General;  Laterality: N/A;  . Transurethral resection of bladder tumor with gyrus (turbt-gyrus) N/A 06/30/2013    Procedure: TRANSURETHRAL RESECTION OF BLADDER TUMOR WITH GYRUS (TURBT-GYRUS)/VAGINAL BIOPSY;  Surgeon: Ardis Hughs, MD;  Location: [facility];  Service: Urology;  Laterality: N/A;  . Eye surgery    . Colon surgery       Prescriptions prior to admission  Medication Sig Dispense Refill Last Dose  . albuterol (PROVENTIL HFA;VENTOLIN HFA) 108 (90 BASE) MCG/ACT inhaler Inhale 2 puffs into the lungs every 6 (six) hours as needed for wheezing.   Past Week at Unknown time  . aspirin 325 MG tablet Take 325 mg by mouth daily.   Past Week at Unknown time  . furosemide (LASIX) 80 MG tablet Take 40 mg by mouth daily.   Past Week at Unknown time  .  lisinopril (PRINIVIL,ZESTRIL) 20 MG tablet Take 10 mg by mouth every morning.    Past Week at Unknown time  . polyvinyl alcohol (LIQUIFILM TEARS) 1.4 % ophthalmic solution Place 1 drop into both eyes as needed for dry eyes.   Past Month at Unknown time  . zolpidem (AMBIEN) 5 MG tablet    Past Month at Unknown time  . acetaminophen (TYLENOL) 500 MG tablet Take 1,000 mg by mouth every 6 (six) hours as needed (Pain). Reported on 01/06/2015   Not Taking  . aspirin 81 MG tablet Take 81 mg by mouth daily. Reported on 01/06/2015   Not Taking  . Guaifenesin (MUCINEX MAXIMUM STRENGTH) 1200 MG TB12 Take 1 tablet (1,200 mg total) by mouth 2 (two) times daily. (Patient not taking: Reported on 07/08/2014) 14 each 0 Not Taking  . magnesium hydroxide (MILK OF MAGNESIA) 400 MG/5ML suspension Take by mouth daily as needed for mild constipation.   Taking    Inpatient Medications:  . atorvastatin  40 mg Oral q1800  . benzonatate  100 mg Oral TID  . clopidogrel  75 mg Oral Daily  . enoxaparin (LOVENOX) injection  40 mg Subcutaneous Q24H    Allergies:  Allergies  Allergen Reactions  . Indomethacin Anaphylaxis and Other (See Comments)    "took it fine for awhile; one day I stopped breathing and I ended up in the hospital" (01/02/2012)    Social History   Social History  . Marital Status: Widowed    Spouse Name: N/A  . Number of Children: 6  . Years of Education: N/A   Occupational History  . retired    Social History Main Topics  . Smoking status: Former Smoker -- 0.12 packs/day for 35 years    Types: Cigarettes    Quit date: 01/02/1991  . Smokeless tobacco: Never Used  . Alcohol Use: No  . Drug Use: No  . Sexual Activity: No   Other Topics Concern  . Not on file   Social History Narrative     Family History  Problem Relation Age of Onset  . Heart disease    . Cancer Sister     Breast  . Stroke Sister   . Heart disease Mother       Review of Systems: All other systems reviewed and are  otherwise negative except as noted above.  Physical Exam: Filed Vitals:   04/12/15 1759 04/12/15 2224 04/13/15 0123 04/13/15 0548  BP:  165/81 134/75 162/72  Pulse: 64 75 75 56  Temp: 98.1 F (36.7 C) 97.6 F (36.4 C) 98.6 F (37 C) 99.1 F (37.3 C)  TempSrc: Oral Oral Oral Oral  Resp: 18 18 16 18   Height:      Weight:      SpO2: 99% 99% 99% 100%    GEN- The patient is elderly, well appearing, alert and oriented x 3 today.   Head- normocephalic, atraumatic Eyes-  Sclera clear, conjunctiva pink Ears- hearing intact Oropharynx- clear Neck- supple Lungs- Clear to ausculation bilaterally, normal work of breathing Heart- Regular rate and rhythm, no murmurs, rubs or gallops  GI- soft, NT, ND Extremities- no clubbing, cyanosis, or edema MS- no significant deformity or atrophy Skin- no rash or lesion Psych- euthymic mood, full affect   Labs:   Lab Results  Component Value Date   WBC 4.3 04/13/2015   HGB 10.6* 04/13/2015   HCT 32.0* 04/13/2015   MCV 97.0 04/13/2015   PLT 238 04/13/2015    Recent Labs Lab 04/11/15 0950  04/13/15 0547  NA 143  < > 141  K 3.9  < > 4.3  CL 105  < > 105  CO2 27  --  28  BUN 19  < > 13  CREATININE 0.96  < > 0.83  CALCIUM 9.0  --  8.8*  PROT 6.6  --   --   BILITOT 0.8  --   --   ALKPHOS 61  --   --   ALT 12*  --   --   AST 18  --   --   GLUCOSE 109*  < > 111*  < > = values in this interval not displayed. Lab Results  Component Value Date   CKTOTAL 54 01/04/2007   CKMB 3.3 01/04/2007   TROPONINI 0.09* 02/15/2014   Lab Results  Component Value Date   CHOL 176 04/11/2015   CHOL 169 04/01/2013   CHOL 114 03/24/2012   Lab Results  Component Value Date   HDL 40* 04/11/2015   HDL 34.90* 04/01/2013   HDL 20* 01/03/2007   Lab Results  Component Value Date   LDLCALC 110* 04/11/2015   LDLCALC 104* 04/01/2013   [place]  01/03/2007  98        Total Cholesterol/HDL:CHD Risk Coronary Heart Disease Risk Table                      Men   Women  1/2 Average Risk   3.4   3.3  Average Risk       5.0   4.4  2 X Average Risk   9.6   7.1  3 X Average Risk  23.4   11.0        Use the calculated Patient Ratio above and the CHD Risk Table to determine the patient's CHD Risk.        ATP III CLASSIFICATION (LDL):  <100     mg/dL   Optimal  100-129  mg/dL   Near or Above                    Optimal  130-159  mg/dL   Borderline  160-189  mg/dL   High  >190     mg/dL   Very High   Lab Results  Component Value Date   TRIG 130 04/11/2015   TRIG 150.0* 04/01/2013   TRIG 89 03/24/2012   Lab Results  Component Value Date   CHOLHDL 4.4 04/11/2015   CHOLHDL 5 04/01/2013   CHOLHDL 6.4 01/03/2007   No results found for: LDLDIRECT  No results found for: DDIMER   Radiology/Studies:  Dg Chest 2 View 04/11/2015  CLINICAL DATA:  Stroke-like symptoms. EXAM: CHEST  2 VIEW COMPARISON:  02/16/2014 and chest CT dated 02/13/2014 FINDINGS: The heart size and pulmonary vascularity are normal. The lungs are hyperinflated but there are no infiltrates or effusions. Marked osteopenia with accentuation of the thoracic kyphosis. Multinodular goiter with multiple irregular calcified nodules as demonstrated on the prior chest CT dated 02/13/2014. IMPRESSION: No acute abnormalities.  Emphysema. Electronically Signed   By: Lorriane Shire M.D.   On: 04/11/2015 11:53   Ct Head Wo Contrast 04/11/2015  CLINICAL DATA:  Code stroke.  Sudden onset right arm weakness EXAM: CT HEAD WITHOUT CONTRAST TECHNIQUE: Contiguous axial images were obtained from the base of the skull through the vertex without intravenous contrast. COMPARISON:  03/22/2012 FINDINGS: Old right occipital infarct. Mild chronic small vessel disease throughout the deep white matter. No acute intracranial abnormality. Specifically, no hemorrhage, hydrocephalus, mass lesion, acute infarction, or significant intracranial injury. No acute calvarial abnormality. Visualized paranasal sinuses and  mastoids clear. Orbital soft tissues unremarkable. IMPRESSION: Old right occipital infarct, stable since 2014. No acute intracranial abnormality. Atrophy, chronic microvascular disease. Critical Value/emergent results were called by telephone at the time of interpretation on 04/11/2015 at 9:56 am to Dr. Leonel Ramsay , who verbally acknowledged these results. Electronically Signed   By: Rolm Baptise M.D.   On: 04/11/2015 09:57   Mr Brain Wo Contrast 04/12/2015  ADDENDUM REPORT: 04/12/2015 12:40 ADDENDUM: We were asked by Neurology to review asymmetry of signal on diffusion sequences in the LEFT precentral cortex with regard to the patient's acute RIGHT hand weakness. Low-level increased signal on DWI axial appears facilitated on ADC in the cortex, but could be mildly restricted in the subcortical white matter. Correlate clinically for an acute versus subacute insult. Electronically Signed   By: Staci Righter M.D.   On: 04/12/2015 12:40 04/12/2015  CLINICAL DATA:  Slurred speech and RIGHT upper extremity weakness earlier today. No tPA was administered. EXAM: MRI HEAD WITHOUT CONTRAST MRA HEAD WITHOUT CONTRAST TECHNIQUE: Multiplanar, multiecho pulse sequences of  the brain and surrounding structures were obtained without intravenous contrast. Angiographic images of the head were obtained using MRA technique without contrast. COMPARISON:  CT head earlier today. FINDINGS: MRI HEAD FINDINGS No evidence for acute infarction, hemorrhage, mass lesion, hydrocephalus, or extra-axial fluid. Generalized atrophy. Moderately advanced chronic microvascular ischemic change. Remote RIGHT occipital infarct. No midline abnormality. Hyperostosis frontalis interna. BILATERAL cataract extraction. No sinus or mastoid disease. MRA HEAD FINDINGS Internal carotid artery is widely patent. Basilar artery widely patent, with vertebrals codominant. No intracranial stenosis or occlusion, except for moderately diseased RIGHT A1 ACA, of no apparent  clinical consequence given the robust LEFT anterior cerebral. No visible saccular aneurysm. IMPRESSION: Atrophy and small vessel disease.  No acute intracranial findings. No significant intracranial large vessel stenosis or occlusion. Electronically Signed: By: Staci Righter M.D. On: 04/11/2015 13:57    US Thyroid 04/13/2015  CLINICAL DATA:  Thyroid goiter.  Prior iodine therapy. EXAM: THYROID ULTRASOUND TECHNIQUE: Ultrasound examination of the thyroid gland and adjacent soft tissues was performed. COMPARISON:  PET-CT 07/14/2013.  Chest CT 02/13/2014 FINDINGS: Right thyroid lobe Measurements: 10.1 x 3.8 x 4.9 cm. The right thyroid tissue is markedly enlarged and diffusely heterogeneous. Morphology of the thyroid tissue is suggestive for complete replacement of the thyroid tissue with multiple nodules. There is a focal calcified lesion in the right thyroid lobe measuring 1.3 x 1.0 x 1.2 cm. There is posterior acoustic shadowing associated with this large calcification. There is a large heterogeneous nodule occupying the superior and lateral aspect of the right thyroid lobe that corresponds with the previously described hypermetabolic lesion on PET-CT. Left thyroid lobe Measurements: 6.1 x 3.5 x 5.2 cm. Left thyroid tissue is diffusely heterogeneous and has same echotexture as the right thyroid lobe. There is a large calcified lesion in the left thyroid lobe measuring 2.9 x 2.4 x 2.3 cm. Large amount of posterior acoustic shadowing associated this large calcified lesion. Areas of cystic degeneration within the left thyroid lobe. Isthmus Thickness: 1.9 cm. Enlarged heterogeneous. Large vascular structure extending through the isthmus. Lymphadenopathy None visualized. IMPRESSION: Multi nodular goiter. Thyroid tissue is markedly enlarged and replaced by multiple heterogeneous nodules. Again noted are two large foci of calcifications which were present on the previous cross-sectional imaging. Prior PET-CT demonstrated  a suspicious 5 cm hypermetabolic nodule involving the superior right thyroid lobe. If thyroid nodule workup is desired, recommend ultrasound-guided aspiration of the large nodule involving the superior right thyroid lobe. Electronically Signed   By: Markus Daft M.D.   On: 04/13/2015 07:50    12-lead ECG SR All prior EKG's in EPIC reviewed with no documented atrial fibrillation  Telemetry SR, PVC's noted, occ couplets and infrequent NSVT 3 beats.  (old CV strips in EPIC reviewed, WCT up to 8beats noted, during a lengthy complicated hospitalization with intrabdominal abscess and EF then 40-45%) no AF  Assessment and Plan:  1. Cryptogenic stroke The patient presents with cryptogenic stroke.  The patient has a TEE planned for later today.  I spoke at length with the patient about monitoring for afib with either a 30 day event monitor or an implantable loop recorder.  Risks, benefits, and alteratives to implantable loop recorder were discussed with the patient today.   At this time, if anything, she thinks she would consider the loop implant but is somewhat hesitant given her age.  She will discuss further with Dr. Caryl Comes  2. Ventricular tachycardia nonsustained   3. Hypertension     Wound care was reviewed  with the patient (keep incision clean and dry for 3 days).  Wound check will be scheduled should she decide to proceed.  Please call with questions.   Renee Dyane Dustman, PA-C 04/13/2015  Pt seen and examined CVA with residual R hand weakness, no hx of afib TEE pending  VT NS is of little neg prognostic value given normal LV function;  Bradycardia precludes uptitration of BB  Hypertension management per Primary team  If TEE is neg, will proceed with loop insertion  Have reviewed risks and benefits with pt and her family as well as the limitations of aspirin therapy for Afib CVA risk reduction.

## 2015-04-13 NOTE — Progress Notes (Signed)
Physical Therapy Treatment and Discharge Patient Details Name: [name] MRN: [id number] DOB: 1925-02-05 Today's Date: 04/13/2015    History of Present Illness Presented with Rt hand weakness. Initial MRI stated no acute infarct, however re-examined 4/11 with Lt pre-central subacute to acute "insult" PMHx-HTN, CHF, COPD, abdominal abscess, colostomy and takedown.     PT Comments    Patient mobilizing well using 2 wheel RW. She is accustomed to 4 wheel walker, however prefers to use a 2 wheel walker if going outside (she can transport more easily due to not as heavy--has practiced with her daughter's RW). Pt reports she paid for her 4 wheel walker (not insurance). Discussed other benefits of 2 wheel walker related to posture, which patient mentioned as a concern. Currently at modified independent with transfers and ambulation. Will d/c from PT.  Follow Up Recommendations  No PT follow up     Equipment Recommendations  Rolling walker with 5" wheels    Recommendations for Other Services       Precautions / Restrictions Precautions Precautions: Fall Restrictions Weight Bearing Restrictions: No    Mobility  Bed Mobility                  Transfers Overall transfer level: Modified independent Equipment used: Rolling walker (2 wheeled);None Transfers: Sit to/from Stand Sit to Stand: Modified independent (Device/Increase time)         General transfer comment: Pt able to recall proper use of RW x 3  Ambulation/Gait Ambulation/Gait assistance: Supervision;Modified independent (Device/Increase time) Ambulation Distance (Feet): 150 Feet Assistive device: Rolling walker (2 wheeled) Gait Pattern/deviations: Step-through pattern;Decreased stride length;Trunk flexed   Gait velocity interpretation: at or above normal speed for age/gender General Gait Details: kyphotic posture and pushes RW too far ahead of her (available RW also too tall for her); able to maneuver RW  through tight spaces   Stairs            Wheelchair Mobility    Modified Rankin (Stroke Patients Only) Modified Rankin (Stroke Patients Only) Pre-Morbid Rankin Score: Moderate disability Modified Rankin: Moderately severe disability     Balance   Sitting-balance support: No upper extremity supported;Feet supported Sitting balance-Leahy Scale: Good     Standing balance support: No upper extremity supported Standing balance-Leahy Scale: Fair                      Cognition Arousal/Alertness: Awake/alert Behavior During Therapy: WFL for tasks assessed/performed Overall Cognitive Status: Within Functional Limits for tasks assessed                      Exercises Other Exercises Other Exercises: sit to stand x 10; scapular adduction for improved posture    General Comments        Pertinent Vitals/Pain Pain Assessment: No/denies pain    Home Living                      Prior Function            PT Goals (current goals can now be found in the care plan section) Acute Rehab PT Goals Patient Stated Goal: regain use of Rt hand Time For Goal Achievement: 04/15/15 Progress towards PT goals: Goals met/education completed, patient discharged from PT    Frequency  Min 3X/week    PT Plan Current plan remains appropriate    Co-evaluation             End  of Session Equipment Utilized During Treatment: Gait belt;Oxygen Activity Tolerance: Patient tolerated treatment well Patient left: with call bell/phone within reach;in chair;with chair alarm set;with nursing/sitter in room     Time: 1017-1044 PT Time Calculation (min) (ACUTE ONLY): 27 min  Charges:  $Gait Training: 8-22 mins $Therapeutic Exercise: 8-22 mins                    G Codes:  Functional Assessment Tool Used: clinical judgement Functional Limitation: Mobility: Walking and moving around Mobility: Walking and Moving Around Goal Status [phone]): At least 1 percent but  less than 20 percent impaired, limited or restricted Mobility: Walking and Moving Around Discharge Status [phone]): At least 1 percent but less than 20 percent impaired, limited or restricted   Alajah Witman 04/13/2015, 10:55 AM Pager [phone]

## 2015-04-13 NOTE — Progress Notes (Signed)
OT Cancellation Note    04/13/15 1506  OT Visit Information  Last OT Received On 04/13/15  Reason Eval/Treat Not Completed Patient at procedure or test/ unavailable (per RN, off the floor for TEE)   Will follow up for OT session as time allows.  Truman Hayward, M.S., OTR/L Pager: [phone]

## 2015-04-13 NOTE — CV Procedure (Signed)
     Pt received Versed 4 mg and Fentanly 50 mcg. She was asleep but would wake up when the probe was in the back of her throat Would need general anesthesia. At this point, I'm not sure that we need to put this 82 yo through general anesthesia to do a TEE.  Even if she were to have a PFO, I would not advise closing it .   I will see her in the office. We can plae a 30 day event monitor on her to look for atrial fib.  No complications after TEE attempts.     [name], Wonda Cheng, MD  04/13/2015 4:35 PM    [address],  [place], [place]  [id number] Pager [phone] Phone: [phone]; Fax: [phone]

## 2015-04-14 DIAGNOSIS — I63412 Cerebral infarction due to embolism of left middle cerebral artery: Secondary | ICD-10-CM

## 2015-04-14 LAB — BASIC METABOLIC PANEL
ANION GAP: 7 (ref 5–15)
BUN: 11 mg/dL (ref 6–20)
CALCIUM: 8.8 mg/dL — AB (ref 8.9–10.3)
CHLORIDE: 106 mmol/L (ref 101–111)
CO2: 28 mmol/L (ref 22–32)
Creatinine, Ser: 0.77 mg/dL (ref 0.44–1.00)
GFR calc non Af Amer: >60 mL/min (ref >60)
Glucose, Bld: 108 mg/dL — ABNORMAL HIGH (ref 65–99)
POTASSIUM: 4.4 mmol/L (ref 3.5–5.1)
Sodium: 141 mmol/L (ref 135–145)

## 2015-04-14 LAB — CBC
HEMATOCRIT: 29.8 % — AB (ref 36.0–46.0)
HEMOGLOBIN: 10.3 g/dL — AB (ref 12.0–15.0)
MCH: 34 pg (ref 26.0–34.0)
MCHC: 34.6 g/dL (ref 30.0–36.0)
MCV: 98.3 fL (ref 78.0–100.0)
Platelets: 214 10*3/uL (ref 150–400)
RBC: 3.03 MIL/uL — AB (ref 3.87–5.11)
RDW: 12.4 % (ref 11.5–15.5)
WBC: 4.1 10*3/uL (ref 4.0–10.5)

## 2015-04-14 MED ORDER — ATORVASTATIN CALCIUM 40 MG PO TABS: 40.0000 mg | ORAL_TABLET | Freq: Every day | ORAL | Status: DC

## 2015-04-14 MED ORDER — CLOPIDOGREL BISULFATE 75 MG PO TABS: 75.0000 mg | ORAL_TABLET | Freq: Every day | ORAL | Status: DC

## 2015-04-14 MED ORDER — FUROSEMIDE 20 MG PO TABS: 20.0000 mg | ORAL_TABLET | Freq: Every day | ORAL | Status: DC

## 2015-04-14 MED ORDER — LISINOPRIL 10 MG PO TABS
10.0000 mg | ORAL_TABLET | Freq: Every morning | ORAL | Status: DC
  Administered 2015-04-14: 10 mg via ORAL
  Filled 2015-04-14: qty 1

## 2015-04-14 NOTE — Progress Notes (Signed)
Discharge orders received. Pt dressed and belongings packed. IV removed. Pt and family educated on discharge instructions and stroke education. Pt verbalized understanding. Pt given discharge packet. Wheelchair supposed to be delivered to patient's room but was never brought. ED Case Manager called [facility] and they will deliver wheelchair to patient's home tomorrow. Pt's daughter brought an oxygen tank for patient to be transported home with. Oxygen tank was on "refill." Pt's daughter advised to bring another tank from home but patient refused. RN offered to call [facility] to bring oxygen tank, but patient refused that as well. Pt requested to be transported home without oxygen. Pt brought downstairs by staff via wheelchair. After pt was discharged, writer realized that patient was not given prescriptions. Writer called patient's daughter to inform her. Daughter stated to have them called into [facility]. Dr Doyle Askew called and given phone number for pharmacy.

## 2015-04-14 NOTE — Care Management Note (Signed)
Case Management Note  Patient Details  Name: Mardy M Zeck MRN: [id number] Date of Birth: 05/08/1925  Subjective/Objective:                    Action/Plan: Patient discharging home with HH services. CM met with the patient and provided her a list of HH agencies in the [place] area. She selected [facility]. Tiffany with [facility] notified and accepted the referral. Pt also ordered wheelchair with cushion/ walker and tub bench. Patient already has a walker at home. Her insurance will not cover a tub bench. Patient prefers to get the tub bench at an outside source, and she already has a 3 in 1 at home. Jermaine with [facility] notified of the wheelchair and cushion and will deliver the equipment to the room. Bedside RN updated. Expected Discharge Date:                  Expected Discharge Plan:  Home w Home Health Services  In-House Referral:     Discharge planning Services  CM Consult  Post Acute Care Choice:  Durable Medical Equipment, Home Health Choice offered to:  Patient  DME Arranged:  Tub bench, Walker rolling, Wheelchair manual DME Agency:  [facility]  HH Arranged:  RN, OT, Nurse's Aide HH Agency:  [facility]  Status of Service:  Completed, signed off  Medicare Important Message Given:  Yes Date Medicare IM Given:    Medicare IM give by:    Date Additional Medicare IM Given:    Additional Medicare Important Message give by:     If discussed at Long Length of Stay Meetings, dates discussed:    Additional Comments:   F , RN 04/14/2015, 3:27 PM  

## 2015-04-14 NOTE — Care Management Important Message (Signed)
Important Message  Patient Details  Name: [name] MRN: [id number] Date of Birth: 08/28/1925   Medicare Important Message Given:  Yes    Nathen May 04/14/2015, 11:17 AM

## 2015-04-14 NOTE — Discharge Instructions (Signed)
Stroke Prevention Some medical conditions and behaviors are associated with an increased chance of having a stroke. You may prevent a stroke by making healthy choices and managing medical conditions. HOW CAN I REDUCE MY RISK OF HAVING A STROKE?   Stay physically active. Get at least 30 minutes of activity on most or all days.  Do not smoke. It may also be helpful to avoid exposure to secondhand smoke.  Limit alcohol use. Moderate alcohol use is considered to be:  No more than 2 drinks per day for men.  No more than 1 drink per day for nonpregnant women.  Eat healthy foods. This involves:  Eating 5 or more servings of fruits and vegetables a day.  Making dietary changes that address high blood pressure (hypertension), high cholesterol, diabetes, or obesity.  Manage your cholesterol levels.  Making food choices that are high in fiber and low in saturated fat, trans fat, and cholesterol may control cholesterol levels.  Take any prescribed medicines to control cholesterol as directed by your health care provider.  Manage your diabetes.  Controlling your carbohydrate and sugar intake is recommended to manage diabetes.  Take any prescribed medicines to control diabetes as directed by your health care provider.  Control your hypertension.  Making food choices that are low in salt (sodium), saturated fat, trans fat, and cholesterol is recommended to manage hypertension.  Ask your health care provider if you need treatment to lower your blood pressure. Take any prescribed medicines to control hypertension as directed by your health care provider.  If you are 18-39 years of age, have your blood pressure checked every 3-5 years. If you are 40 years of age or older, have your blood pressure checked every year.  Maintain a healthy weight.  Reducing calorie intake and making food choices that are low in sodium, saturated fat, trans fat, and cholesterol are recommended to manage  weight.  Stop drug abuse.  Avoid taking birth control pills.  Talk to your health care provider about the risks of taking birth control pills if you are over 35 years old, smoke, get migraines, or have ever had a blood clot.  Get evaluated for sleep disorders (sleep apnea).  Talk to your health care provider about getting a sleep evaluation if you snore a lot or have excessive sleepiness.  Take medicines only as directed by your health care provider.  For some people, aspirin or blood thinners (anticoagulants) are helpful in reducing the risk of forming abnormal blood clots that can lead to stroke. If you have the irregular heart rhythm of atrial fibrillation, you should be on a blood thinner unless there is a good reason you cannot take them.  Understand all your medicine instructions.  Make sure that other conditions (such as anemia or atherosclerosis) are addressed. SEEK IMMEDIATE MEDICAL CARE IF:   You have sudden weakness or numbness of the face, arm, or leg, especially on one side of the body.  Your face or eyelid droops to one side.  You have sudden confusion.  You have trouble speaking (aphasia) or understanding.  You have sudden trouble seeing in one or both eyes.  You have sudden trouble walking.  You have dizziness.  You have a loss of balance or coordination.  You have a sudden, severe headache with no known cause.  You have new chest pain or an irregular heartbeat. Any of these symptoms may represent a serious problem that is an emergency. Do not wait to see if the symptoms will   go away. Get medical help at once. Call your local emergency services (911 in [place] Do not drive yourself to the hospital.   This information is not intended to replace advice given to you by your health care provider. Make sure you discuss any questions you have with your health care provider.   Document Released: 01/26/2004 Document Revised: 01/08/2014 Document Reviewed:  06/20/2012 Elsevier Interactive Patient Education 2016 Elsevier Inc.  

## 2015-04-14 NOTE — Progress Notes (Signed)
Occupational Therapy Treatment Patient Details Name: [name] MRN: [id number] DOB: 1925-06-24 Today's Date: 04/14/2015    History of present illness Presented with Rt hand weakness. Initial MRI stated no acute infarct, however re-examined 4/11 with Lt pre-central subacute to acute "insult" PMHx-HTN, CHF, COPD, abdominal abscess, colostomy and takedown.    OT comments  Focus of session on R hand coordination and strengthening; pt tolerated exercises with use of super soft theraputty. Educated on energy conservation strategies to use at home upon d/c; family present with pt and they all verbalized understanding. D/c plan remains appropriate. Will continue to follow acutely.   Follow Up Recommendations  Outpatient OT;Supervision - Intermittent    Equipment Recommendations  None recommended by OT    Recommendations for Other Services      Precautions / Restrictions Precautions Precautions: Fall Restrictions Weight Bearing Restrictions: No       Mobility Bed Mobility               General bed mobility comments: Pt OOB in chair upon arrival.  Transfers                 General transfer comment: Not assessed this session    Balance   Sitting-balance support: No upper extremity supported;Feet supported Sitting balance-Leahy Scale: Good                             ADL Overall ADL's : Needs assistance/impaired Eating/Feeding: Set up;Sitting Eating/Feeding Details (indicate cue type and reason): Required set up for lunch                                   General ADL Comments: Educated on incorporating RUE into functional activities, home safety, energy conservation strategies (and provided handout), R hand coordination/strengthening exercises with theraputty (provided with super soft theraputty and handouts).      Vision                     Perception     Praxis      Cognition   Behavior During Therapy: [facility] for tasks  assessed/performed Overall Cognitive Status: Within Functional Limits for tasks assessed                       Extremity/Trunk Assessment               Exercises Other Exercises Other Exercises: Fine motor coordination and strengthening exercises with super soft theraputty. Pt tolerated x 4 exercises x5 reps each.   Shoulder Instructions       General Comments      Pertinent Vitals/ Pain       Pain Assessment: No/denies pain  Home Living                                          Prior Functioning/Environment              Frequency Min 2X/week     Progress Toward Goals  OT Goals(current goals can now be found in the care plan section)  Progress towards OT goals: Progressing toward goals  Acute Rehab OT Goals Patient Stated Goal: regain use of Rt hand  Plan Discharge plan remains appropriate    Co-evaluation  End of Session Equipment Utilized During Treatment: Oxygen   Activity Tolerance Patient tolerated treatment well   Patient Left in chair;with call bell/phone within reach;with chair alarm set;with family/visitor present   Nurse Communication          Time: [id number] OT Time Calculation (min): 23 min  Charges: OT General Charges $OT Visit: 1 Procedure OT Treatments $Therapeutic Exercise: 23-37 mins  Binnie Kand M.S., OTR/L Pager: [phone]  04/14/2015, 11:49 AM

## 2015-04-14 NOTE — Discharge Summary (Signed)
Physician Discharge Summary  [name] [id number] DOB: 16-Jan-1925 DOA: 04/11/2015  PCP: Leonard Downing, MD  Admit date: 04/11/2015 Discharge date: 04/14/2015  Recommendations for Outpatient Follow-up:  1. Pt will need to follow up with PCP in 1-2 weeks post discharge 2. Please obtain BMP to evaluate electrolytes and kidney function 3. Please also check CBC to evaluate Hg and Hct levels 4. HH OT ordered upon discharge  5. Please note that pt did not tolerate TEE and procedure aborted early, cardiologist Dr. Acie Fredrickson will see pt in his office 6. Pt will need 30 day event monitor to look for a-fib  7. This was discussed with daughter at length   Discharge Diagnoses:  Principal Problem:   Cerebral thrombosis with cerebral infarction Active Problems:   COPD (chronic obstructive pulmonary disease) (HCC)   Hypertension   Chronic combined systolic and diastolic congestive heart failure (HCC)   H/O: hypothyroidism   Overweight (BMI 25.0-29.9)   Dysarthria   Stroke-like symptoms   Acute CVA (cerebrovascular accident) ([place])   Cerebrovascular accident (CVA) due to embolism of left middle cerebral artery ([place])   HLD (hyperlipidemia)  Discharge Condition: Stable  Diet recommendation: Heart healthy diet discussed in details    Brief narrative:    82 y.o. female with known hypertension, COPD, chronic diastolic heart, GERD, diverticulitis, squamous cell carcinoma, who presented to [facility] with sudden onset slurred speech and right upper extremity weakness.   In ED, pt was hemodynamically stable, VSS, given on e dose of Aspirin 325 mg PO and THR asked to admit for further evaluation of ? Stroke.   Assessment/Plan:    Principal Problem:  Cerebral thrombosis with cerebral infarction - left pre-central gyrus small patchy infarct, embolic pattern secondary to unknown source - pt has old right occipital cortical infart on MRI, further support cardioembolic source - MRA  unremarkable - ECHO with EF 55 - 60% and grade I diastolic CHF - Due to concerns for cardioembolic infarct, recommend TEE and possible loop recorder, - pt did not tolerate procedure and this was terminated early with plan to do this in an outpatient setting  - LDL 110 - HgbA1c 5.7 - was on aspirin 81 mg daily prior to admission, changed to plavix for stroke prevention - statin upon discharge   Active Problems:  COPD (chronic obstructive pulmonary disease) ([place]) - respiratory status stable this AM - oxygen saturation at target range    Hypertension, essential  - permissive HTN for now with gradual normalization in the next week - BP currently 160/66 - continue home medical regimen    Chronic combined systolic and diastolic congestive heart failure (HCC) - ECHO with improved EF up to 55 - 60% and grade I diastolic CHF   Obesity  - Body mass index is 31.26 kg/(m^2).   Anemia, acute drop in Hg since admission - no signs of bleeding  Code Status: DNR Family Communication: plan of care discussed with the patient and daughter at bedside  Disposition Plan: Home  IV access:  Peripheral IV  Procedures and diagnostic studies:   Dg Chest 2 View 04/11/2015 No acute abnormalities. Emphysema.  Ct Head Wo Contrast 04/11/2015 Old right occipital infarct, stable since 2014. No acute intracranial abnormality. Atrophy, chronic microvascular disease.   Mr Jodene Nam Head/brain Wo Cm 04/12/2015 Correlate clinically for an acute versus subacute insult. 04/12/2015 Atrophy and small vessel disease. No acute intracranial findings.   US Thyroid 04/13/2015 Multi nodular goiter. Thyroid tissue is markedly enlarged and replaced by multiple heterogeneous  nodules. Again noted are two large foci of calcifications which were present on the previous cross-sectional imaging. Prior PET-CT demonstrated a suspicious 5 cm hypermetabolic nodule involving the superior right thyroid lobe. If thyroid  nodule workup is desired, recommend ultrasound-guided aspiration of the large nodule involving the superior right thyroid lobe.  Medical Consultants:  Neurology  Cardiology consulted for TEE which is scheduled for today 4/12 at 16:00  Other Consultants:  PT/OT  IAnti-Infectives:   None   Faye Ramsay, MD [facility] Pager [phone]        Discharge Exam: Filed Vitals:   04/14/15 0948 04/14/15 1327  BP: 156/70 155/65  Pulse: 63 65  Temp: 97.8 F (36.6 C) 98.4 F (36.9 C)  Resp: 20 20   Filed Vitals:   04/14/15 0152 04/14/15 0514 04/14/15 0948 04/14/15 1327  BP: 148/60 138/67 156/70 155/65  Pulse: 62 73 63 65  Temp: 97.6 F (36.4 C) 98 F (36.7 C) 97.8 F (36.6 C) 98.4 F (36.9 C)  TempSrc: Oral Oral Oral Oral  Resp: 20 20 20 20   Height:      Weight:      SpO2: 98% 100% 100% 100%    General: Pt is alert, follows commands appropriately, not in acute distress Cardiovascular: Regular rate and rhythm, no rubs, no gallops Respiratory: Clear to auscultation bilaterally, no wheezing, no crackles, no rhonchi Abdominal: Soft, non tender, non distended, bowel sounds +, no guarding   Discharge Instructions  Discharge Instructions    Ambulatory referral to Neurology    Complete by:  As directed   Pt will follow up with Dr. Erlinda Hong at [facility] in about 2 months. Thanks.     Diet - low sodium heart healthy    Complete by:  As directed      Increase activity slowly    Complete by:  As directed             Medication List    STOP taking these medications        aspirin 325 MG tablet     aspirin 81 MG tablet      TAKE these medications        acetaminophen 500 MG tablet  Commonly known as:  TYLENOL  Take 1,000 mg by mouth every 6 (six) hours as needed (Pain). Reported on 01/06/2015     albuterol 108 (90 Base) MCG/ACT inhaler  Commonly known as:  PROVENTIL HFA;VENTOLIN HFA  Inhale 2 puffs into the lungs every 6 (six) hours as needed for wheezing.      atorvastatin 40 MG tablet  Commonly known as:  LIPITOR  Take 1 tablet (40 mg total) by mouth daily at 6 PM.     clopidogrel 75 MG tablet  Commonly known as:  PLAVIX  Take 1 tablet (75 mg total) by mouth daily.     furosemide 20 MG tablet  Commonly known as:  LASIX  Take 1 tablet (20 mg total) by mouth daily.     Guaifenesin 1200 MG Tb12  Commonly known as:  MUCINEX MAXIMUM STRENGTH  Take 1 tablet (1,200 mg total) by mouth 2 (two) times daily.     lisinopril 20 MG tablet  Commonly known as:  PRINIVIL,ZESTRIL  Take 10 mg by mouth every morning.     magnesium hydroxide 400 MG/5ML suspension  Commonly known as:  MILK OF MAGNESIA  Take by mouth daily as needed for mild constipation.     polyvinyl alcohol 1.4 % ophthalmic solution  Commonly known as:  LIQUIFILM TEARS  Place 1 drop into both eyes as needed for dry eyes.     zolpidem 5 MG tablet  Commonly known as:  AMBIEN            Follow-up Information    Follow up with Xu,Jindong, MD. Schedule an appointment as soon as possible for a visit in 2 months.   Specialty:  Neurology   Why:  stroke clinic   Contact information:   [address] [place] [address] [phone]       Follow up with Leonard Downing, MD.   Specialty:  [facility]   Contact information:   [place] [address] [phone]        The results of significant diagnostics from this hospitalization (including imaging, microbiology, ancillary and laboratory) are listed below for reference.     Microbiology: No results found for this or any previous visit (from the past 240 hour(s)).   Labs: Basic Metabolic Panel:  Recent Labs Lab 04/11/15 0950 04/11/15 0951 04/13/15 0547 04/14/15 0527  NA 143 144 141 141  K 3.9 3.8 4.3 4.4  CL 105 103 105 106  CO2 27  --  28 28  GLUCOSE 109* 106* 111* 108*  BUN 19 22* 13 11  CREATININE 0.96 0.90 0.83 0.77  CALCIUM 9.0  --  8.8* 8.8*   Liver  Function Tests:  Recent Labs Lab 04/11/15 0950  AST 18  ALT 12*  ALKPHOS 61  BILITOT 0.8  PROT 6.6  ALBUMIN 3.7   CBC:  Recent Labs Lab 04/11/15 0950 04/11/15 0951 04/13/15 0547 04/14/15 0527  WBC 5.5  --  4.3 4.1  NEUTROABS 3.7  --   --   --   HGB 12.0 12.6 10.6* 10.3*  HCT 35.7* 37.0 32.0* 29.8*  MCV 94.4  --  97.0 98.3  PLT 242  --  238 214    CBG:  Recent Labs Lab 04/11/15 1005  GLUCAP 107*   SIGNED: Time coordinating discharge: 30 minutes  MAGICK-Roselind Klus, MD  [facility] 04/14/2015, 1:57 PM Pager [phone]  If 7PM-7AM, please contact night-coverage [email or web address] Password [email or web address]

## 2015-04-19 ENCOUNTER — Telehealth: Payer: Self-pay

## 2015-04-19 NOTE — Telephone Encounter (Signed)
Rn receive incoming call from [place] at [facility]. Rn stated Dr. Erlinda Hong is not the attending MD for patient. He is the patients stroke/neurologist MD, and did order home occupational therapy. Rn stated a form was sent that they were unable to get in contact with the patient. [place] stated they did make contact with the patient and she has been seen. [place] stated the forms dont have to be turn because of the phone call made by [facility] nurse. [place] also stated it was a error putting COPD as  the diagnose.She will change the diagnose code to Stroke. Rn stated Dr. Erlinda Hong will be managing her therapy and any stroke concerns. [place] appreciate the phone call for verification.

## 2015-04-19 NOTE — Telephone Encounter (Signed)
:  LFt vm for [facility] concerning forms to be sign by Dr.Xu. Rn left message that Dr. Erlinda Hong needs clarification on the in home OT orders, and diagnose being COPD.

## 2015-04-20 ENCOUNTER — Ambulatory Visit (INDEPENDENT_AMBULATORY_CARE_PROVIDER_SITE_OTHER): Payer: Medicare Other | Admitting: Cardiovascular Disease

## 2015-04-20 ENCOUNTER — Encounter: Payer: Self-pay | Admitting: Cardiovascular Disease

## 2015-04-20 VITALS — BP 160/76 | HR 80 | Ht 63.0 in | Wt 158.4 lb

## 2015-04-20 DIAGNOSIS — I63412 Cerebral infarction due to embolism of left middle cerebral artery: Secondary | ICD-10-CM | POA: Diagnosis not present

## 2015-04-20 DIAGNOSIS — I5042 Chronic combined systolic (congestive) and diastolic (congestive) heart failure: Secondary | ICD-10-CM

## 2015-04-20 MED ORDER — LISINOPRIL 20 MG PO TABS: 20.0000 mg | ORAL_TABLET | Freq: Every day | ORAL | Status: DC

## 2015-04-20 NOTE — Progress Notes (Signed)
Cardiology Office Note   Date:  04/20/2015   ID:  [name], DOB 03-Jun-1925, MRN [id number]  PCP:  Leonard Downing, MD  Cardiologist:   Thayer Headings, MD   Chief Complaint  Patient presents with  . Follow-up   1. HTN 2. CHF 3. Hypothyroidism 4. diverticulitis  History of Present Illness:  [name] is an 82 yo with hx diverticulitis with diverticulosis in April. She had a perforated diverticulum and became septic. She had emergent surgery with a colostomy placement. She now scheduled to have reversal of her colostomy and the surgeons are requesting cardiac clearance. When she was hospitalized she had an echocardiogram that revealed mildly depressed left ventricular systolic function with an ejection fraction of 40-45%. She did have some diastolic dysfunction.  Left ventricle: The cavity size was moderately dilated. Wall thickness was normal. Systolic function was mildly to moderately reduced. The estimated ejection fraction was in the range of 40% to 45%. There was an increased relative contribution of atrial contraction to ventricular filling. - Left atrium: The atrium was mildly dilated  He has not had any cardiac problems since I last saw her 7 years ago. She does all of her housework without any significant problems. She's able to calm 1 flight of stairs without difficulty. She thinks that she would become short of breath if I asked her to climb 2 flights of stairs.  April, 2015:  She is doing well. She tolerated her colostomy reversal last year without problems. Able to do all of her normal activities without any chest pain or shortness breath. She still able to go up steps although she comments that she climbs slowly.   [name] 12, 2016  [name] is a 82 y.o. female who presents for follow-up of her mild congestive heart failure. She was diagnosed with squamous cell carcinoma of unknown primary. She was hospitalized in Feb, 2016 with pneumonia    Breathing is ok.  A little short of breath  Her BP readings at physical therapy are all in the normal range. She did not take her meds this am and the BP is a bit high  April 20, 2015:  [name] was hospitalized with a stroke. She was scheduled for TEE but we were unable to pass the scope. After much effort it was determined that we would likely need general anesthesia. I made the determination given her age and the fact that a transesophageal echo would probably not change our management much that we would cancel the transesophageal echo. The plan was to place a 30 day event monitor for further evaluation to look for atrial fibrillation.  Past Medical History  Diagnosis Date  . Hypertension   . COPD (chronic obstructive pulmonary disease) ([place])   . CHF (congestive heart failure) ([place])   . SOB (shortness of breath)     'all the time" (01/02/2012)  . Arthritis     "right shoulder" (01/02/2012)  . Pneumonia 01/02/2012; ? 2009  . GERD (gastroesophageal reflux disease)     occasional  . Anemia     years ago  . Diverticulitis of colon with perforation 01/14/2012    That is post sigmoid resection with [facility] pouch and end colostomy   . CAP (community acquired pneumonia) 01/02/2012    Lower lobe   . Intra-abdominal abscess ([place]) 01/14/2012  . Pneumatosis of intestines 01/14/2012  . UTI (lower urinary tract infection) 01/03/2012  . Bladder mass   . History of kidney stones  X 1  . Difficulty sleeping   . Goiter     radioactive iodine ablation/notes 01/02/2012  . Hypothyroidism     "I have taken Synthroid before" (01/02/2012)   HAS HAD RADIOACTIVE IODINE TX 2 YR S AGO  . Radiation 08/10/13-09/15/13    57 gray to vaginal/bladder mass    Past Surgical History  Procedure Laterality Date  . Cataract extraction w/ intraocular lens  implant, bilateral  ?1990's    Bil  . Appendectomy  1980's    "when they did hysterectomy" (01/02/2012)  . Abdominal hysterectomy  1980's  . Colostomy revision N/A  03/12/2012    Procedure: COLON RESECTION SIGMOID ;  Surgeon: Ralene Ok, MD;  Location: [place];  Service: General;  Laterality: N/A;  . Colostomy N/A 03/12/2012    Procedure: COLOSTOMY;  Surgeon: Ralene Ok, MD;  Location: [place];  Service: General;  Laterality: N/A;  . Colostomy takedown N/A 10/21/2012    Procedure: LAPAROSCOPIC COLOSTOMY TAKEDOWN AND HARTMANS ANASTOMOSIS, rigid proctoscopy;  Surgeon: Ralene Ok, MD;  [facility];  Service: General;  Laterality: N/A;  . Lysis of adhesion N/A 10/21/2012    Procedure: LYSIS OF ADHESION;  Surgeon: Ralene Ok, MD;  [facility];  Service: General;  Laterality: N/A;  . Transurethral resection of bladder tumor with gyrus (turbt-gyrus) N/A 06/30/2013    Procedure: TRANSURETHRAL RESECTION OF BLADDER TUMOR WITH GYRUS (TURBT-GYRUS)/VAGINAL BIOPSY;  Surgeon: Ardis Hughs, MD;  [facility];  Service: Urology;  Laterality: N/A;  . Eye surgery    . Colon surgery       Current Outpatient Prescriptions  Medication Sig Dispense Refill  . acetaminophen (TYLENOL) 500 MG tablet Take 1,000 mg by mouth every 6 (six) hours as needed (Pain). Reported on 01/06/2015    . albuterol (PROVENTIL HFA;VENTOLIN HFA) 108 (90 BASE) MCG/ACT inhaler Inhale 2 puffs into the lungs every 6 (six) hours as needed for wheezing.    Marland Kitchen atorvastatin (LIPITOR) 40 MG tablet Take 1 tablet (40 mg total) by mouth daily at 6 PM. 30 tablet 1  . clopidogrel (PLAVIX) 75 MG tablet Take 1 tablet (75 mg total) by mouth daily. 30 tablet 1  . furosemide (LASIX) 20 MG tablet Take 1 tablet (20 mg total) by mouth daily. 30 tablet 1  . lisinopril (PRINIVIL,ZESTRIL) 20 MG tablet Take 10 mg by mouth every morning.     . magnesium hydroxide (MILK OF MAGNESIA) 400 MG/5ML suspension Take by mouth daily as needed for mild constipation.    . polyvinyl alcohol (LIQUIFILM TEARS) 1.4 % ophthalmic solution Place 1 drop into both eyes as needed for dry eyes.    Marland Kitchen zolpidem (AMBIEN) 5  MG tablet Take 5 mg by mouth at bedtime as needed.     . Guaifenesin (MUCINEX MAXIMUM STRENGTH) 1200 MG TB12 Take 1 tablet (1,200 mg total) by mouth 2 (two) times daily. (Patient not taking: Reported on 07/08/2014) 14 each 0   No current facility-administered medications for this visit.    Allergies:   Indomethacin    Social History:  The patient  reports that she quit smoking about 24 years ago. Her smoking use included Cigarettes. She has a 4.2 pack-year smoking history. She has never used smokeless tobacco. She reports that she does not drink alcohol or use illicit drugs.   Family History:  The patient's family history includes Cancer in her sister; Heart disease in her mother; Stroke in her sister.    ROS:  Please see the history of  present illness.    Review of Systems: Constitutional:  denies fever, chills, diaphoresis, appetite change and fatigue.  HEENT: denies photophobia, eye pain, redness, hearing loss, ear pain, congestion, sore throat, rhinorrhea, sneezing, neck pain, neck stiffness and tinnitus.  Respiratory: denies SOB, DOE, cough, chest tightness, and wheezing.  Cardiovascular: denies chest pain, palpitations and leg swelling.  Gastrointestinal: denies nausea, vomiting, abdominal pain, diarrhea, constipation, blood in stool.  Genitourinary: denies dysuria, urgency, frequency, hematuria, flank pain and difficulty urinating.  Musculoskeletal: denies  myalgias, back pain, joint swelling, arthralgias and gait problem.   Skin: denies pallor, rash and wound.  Neurological: denies dizziness, seizures, syncope, weakness, light-headedness, numbness and headaches.   Hematological: denies adenopathy, easy bruising, personal or family bleeding history.  Psychiatric/ Behavioral: denies suicidal ideation, mood changes, confusion, nervousness, sleep disturbance and agitation.       All other systems are reviewed and negative.    PHYSICAL EXAM: VS:  BP 160/76 mmHg  Pulse 80  Ht  5\' 3"  (1.6 m)  Wt 158 lb 6.4 oz (71.85 kg)  BMI 28.07 kg/m2  SpO2 98% , BMI Body mass index is 28.07 kg/(m^2). GEN: Well nourished, well developed, in no acute distress HEENT: normal Neck: no JVD, carotid bruits, or masses Cardiac: RRR; no murmurs, rubs, or gallops,no edema  Respiratory:  clear to auscultation bilaterally, normal work of breathing GI: soft, nontender, nondistended, + BS MS: no deformity or atrophy Skin: warm and dry, no rash Neuro:  Strength and sensation are intact Psych: normal   EKG:  EKG is not ordered today.    Recent Labs: 04/11/2015: ALT 12*; TSH 0.665 04/14/2015: BUN 11; Creatinine, Ser 0.77; Hemoglobin 10.3*; Platelets 214; Potassium 4.4; Sodium 141    Lipid Panel    Component Value Date/Time   CHOL 176 04/11/2015 1704   TRIG 130 04/11/2015 1704   HDL 40* 04/11/2015 1704   CHOLHDL 4.4 04/11/2015 1704   VLDL 26 04/11/2015 1704   LDLCALC 110* 04/11/2015 1704      Wt Readings from Last 3 Encounters:  04/20/15 158 lb 6.4 oz (71.85 kg)  04/11/15 176 lb 6.4 oz (80.015 kg)  01/06/15 158 lb 4.8 oz (71.804 kg)      Other studies Reviewed: Additional studies/ records that were reviewed today include: . Review of the above records demonstrates:    ASSESSMENT AND PLAN:  1. HTN - BP is well controlled.  2. CHF 3. Hypothyroidism 4. diverticulitis 5. CVA: [name] had a CVA and was admitted to the hospital. I was not able to perform a transesophageal echo due to inability to pass the scope. We may have been able to with general anesthesia but at this point I don't think that we need to necessarily do a TEE on this 82 yo  patient. I don't think that it would change our management much. We will place a 30 day event monitor on her - primarily to look for atrial fibrillation.  She's currently on Plavix. We will change that to Coumadin or one of the new oral anticoagulant agents if she is found to  have atrial fibrillation.   Current medicines are  reviewed at length with the patient today.  The patient does not have concerns regarding medicines.  The following changes have been made:  no change  Labs/ tests ordered today include:  No orders of the defined types were placed in this encounter.     Disposition:   FU with me in 1 year  Jacelyn Cuen, Wonda Cheng, MD  04/20/2015 12:00 PM    [facility] [place] [address], [place], [place]  [address] Phone: [phone]; Fax: [phone]   [facility]  [address] [place],   [address] [phone]    Fax [phone]

## 2015-04-20 NOTE — Patient Instructions (Signed)
Medication Instructions:  INCREASE Lisinopril to 20 mg once daily   Labwork: Your physician recommends that you return for lab work in: 3 weeks for basic metabolic panel   Testing/Procedures: Your physician has recommended that you wear an event monitor. Event monitors are medical devices that record the heart's electrical activity. Doctors most often [place] these monitors to diagnose arrhythmias. Arrhythmias are problems with the speed or rhythm of the heartbeat. The monitor is a small, portable device. You can wear one while you do your normal daily activities. This is usually used to diagnose what is causing palpitations/syncope (passing out).   Follow-Up: Your physician recommends that you schedule a follow-up appointment in: 3 months with Dr. Acie Fredrickson   If you need a refill on your cardiac medications before your next appointment, please call your pharmacy.   Thank you for choosing [facility]! Christen Bame, RN [phone]

## 2015-04-22 ENCOUNTER — Other Ambulatory Visit: Payer: Self-pay | Admitting: Cardiovascular Disease

## 2015-04-22 ENCOUNTER — Ambulatory Visit (INDEPENDENT_AMBULATORY_CARE_PROVIDER_SITE_OTHER): Payer: Medicare Other

## 2015-04-22 DIAGNOSIS — I5042 Chronic combined systolic (congestive) and diastolic (congestive) heart failure: Secondary | ICD-10-CM

## 2015-04-22 DIAGNOSIS — I63412 Cerebral infarction due to embolism of left middle cerebral artery: Secondary | ICD-10-CM

## 2015-04-22 DIAGNOSIS — I4891 Unspecified atrial fibrillation: Secondary | ICD-10-CM | POA: Diagnosis not present

## 2015-05-09 ENCOUNTER — Other Ambulatory Visit (INDEPENDENT_AMBULATORY_CARE_PROVIDER_SITE_OTHER): Payer: Medicare Other | Admitting: *Deleted

## 2015-05-09 DIAGNOSIS — I5042 Chronic combined systolic (congestive) and diastolic (congestive) heart failure: Secondary | ICD-10-CM | POA: Diagnosis not present

## 2015-05-09 LAB — BASIC METABOLIC PANEL
BUN: 21 mg/dL (ref 7–25)
CALCIUM: 8.7 mg/dL (ref 8.6–10.4)
CO2: 25 mmol/L (ref 20–31)
Chloride: 107 mmol/L (ref 98–110)
Creat: 0.75 mg/dL (ref 0.60–0.88)
GLUCOSE: 94 mg/dL (ref 65–99)
Potassium: 4.1 mmol/L (ref 3.5–5.3)
SODIUM: 139 mmol/L (ref 135–146)

## 2015-05-09 NOTE — Addendum Note (Signed)
Addended by: Eulis Foster on: 05/09/2015 11:57 AM   Modules accepted: Orders

## 2015-05-27 ENCOUNTER — Ambulatory Visit (INDEPENDENT_AMBULATORY_CARE_PROVIDER_SITE_OTHER): Payer: Medicare Other | Admitting: Cardiovascular Disease

## 2015-05-27 ENCOUNTER — Encounter: Payer: Self-pay | Admitting: Cardiovascular Disease

## 2015-05-27 VITALS — BP 156/78 | HR 69 | Ht 63.0 in | Wt 156.4 lb

## 2015-05-27 DIAGNOSIS — I10 Essential (primary) hypertension: Secondary | ICD-10-CM

## 2015-05-27 DIAGNOSIS — I5033 Acute on chronic diastolic (congestive) heart failure: Secondary | ICD-10-CM | POA: Diagnosis not present

## 2015-05-27 DIAGNOSIS — I5022 Chronic systolic (congestive) heart failure: Secondary | ICD-10-CM

## 2015-05-27 MED ORDER — CLOPIDOGREL BISULFATE 75 MG PO TABS: 75.0000 mg | ORAL_TABLET | Freq: Every day | ORAL | Status: DC

## 2015-05-27 MED ORDER — FUROSEMIDE 20 MG PO TABS: 20.0000 mg | ORAL_TABLET | Freq: Every day | ORAL | Status: DC

## 2015-05-27 MED ORDER — LISINOPRIL 20 MG PO TABS: 20.0000 mg | ORAL_TABLET | Freq: Every day | ORAL | Status: DC

## 2015-05-27 MED ORDER — ATORVASTATIN CALCIUM 40 MG PO TABS: 40.0000 mg | ORAL_TABLET | Freq: Every day | ORAL | Status: DC

## 2015-05-27 NOTE — Progress Notes (Signed)
Cardiology Office Note   Date:  05/27/2015   ID:  [name] [name], DOB 09-25-1925, MRN [id number]  PCP:  Leonard Downing, MD  Cardiologist:   Mertie Moores, MD   No chief complaint on file.  1. HTN 2. CHF 3. Hypothyroidism 4. diverticulitis  History of Present Illness:  [name] [name] is an 82 yo with hx diverticulitis with diverticulosis in April. She had a perforated diverticulum and became septic. She had emergent surgery with a colostomy placement. She now scheduled to have reversal of her colostomy and the surgeons are requesting cardiac clearance. When she was hospitalized she had an echocardiogram that revealed mildly depressed left ventricular systolic function with an ejection fraction of 40-45%. She did have some diastolic dysfunction.  Left ventricle: The cavity size was moderately dilated. Wall thickness was normal. Systolic function was mildly to moderately reduced. The estimated ejection fraction was in the range of 40% to 45%. There was an increased relative contribution of atrial contraction to ventricular filling. - Left atrium: The atrium was mildly dilated  He has not had any cardiac problems since I last saw her 7 years ago. She does all of her housework without any significant problems. She's able to calm 1 flight of stairs without difficulty. She thinks that she would become short of breath if I asked her to climb 2 flights of stairs.  April, 2015:  She is doing well. She tolerated her colostomy reversal last year without problems. Able to do all of her normal activities without any chest pain or shortness breath. She still able to go up steps although she comments that she climbs slowly.   [name] [name], [name]  [name] [name] is a 82 y.o. female who presents for follow-up of her mild congestive heart failure. She was diagnosed with squamous cell carcinoma of unknown primary. She was hospitalized in Feb, [name] with pneumonia   Breathing is ok.  A little short of  breath  Her BP readings at physical therapy are all in the normal range. She did not take her meds this am and the BP is a bit high  April 20, 2015:  [name] [name] was hospitalized with a stroke. She was scheduled for TEE but we were unable to pass the scope. After much effort it was determined that we would likely need general anesthesia. I made the determination given her age and the fact that a transesophageal echo would probably not change our management much that we would cancel the transesophageal echo. The plan was to place a 30 day event monitor for further evaluation to look for atrial fibrillation.  May 27, 2015:  [name] is seen back today . Her 30 day monitor showed no atrial fib.  She is now on a lower dose of lasix  Is having some shortness of breath  - walked from the waiting room to the exam room .  No CP .    Is supposed to be using home O2 but does not have a portable tank that holds oxygen.   Past Medical History  Diagnosis Date  . Hypertension   . COPD (chronic obstructive pulmonary disease) ([place])   . CHF (congestive heart failure) ([place])   . SOB (shortness of breath)     'all the time" (01/02/2012)  . Arthritis     "right shoulder" (01/02/2012)  . Pneumonia 01/02/2012; ? 2009  . GERD (gastroesophageal reflux disease)     occasional  . Anemia     years ago  .  Diverticulitis of colon with perforation 01/14/2012    That is post sigmoid resection with [facility] pouch and end colostomy   . CAP (community acquired pneumonia) 01/02/2012    Lower lobe   . Intra-abdominal abscess ([place]) 01/14/2012  . Pneumatosis of intestines 01/14/2012  . UTI (lower urinary tract infection) 01/03/2012  . Bladder mass   . History of kidney stones     X 1  . Difficulty sleeping   . Goiter     radioactive iodine ablation/notes 01/02/2012  . Hypothyroidism     "I have taken Synthroid before" (01/02/2012)   HAS HAD RADIOACTIVE IODINE TX 2 YR S AGO  . Radiation 08/10/13-09/15/13    57 gray to  vaginal/bladder mass    Past Surgical History  Procedure Laterality Date  . Cataract extraction w/ intraocular lens  implant, bilateral  ?1990's    Bil  . Appendectomy  1980's    "when they did hysterectomy" (01/02/2012)  . Abdominal hysterectomy  1980's  . Colostomy revision N/A 3/[name]/2014    Procedure: COLON RESECTION SIGMOID ;  Surgeon: Ralene Ok, MD;  Location: [place];  Service: General;  Laterality: N/A;  . Colostomy N/A 3/[name]/2014    Procedure: COLOSTOMY;  Surgeon: Ralene Ok, MD;  Location: [place];  Service: General;  Laterality: N/A;  . Colostomy takedown N/A 10/21/2012    Procedure: LAPAROSCOPIC COLOSTOMY TAKEDOWN AND HARTMANS ANASTOMOSIS, rigid proctoscopy;  Surgeon: Ralene Ok, MD;  [facility];  Service: General;  Laterality: N/A;  . Lysis of adhesion N/A 10/21/2012    Procedure: LYSIS OF ADHESION;  Surgeon: Ralene Ok, MD;  [facility];  Service: General;  Laterality: N/A;  . Transurethral resection of bladder tumor with gyrus (turbt-gyrus) N/A 06/30/2013    Procedure: TRANSURETHRAL RESECTION OF BLADDER TUMOR WITH GYRUS (TURBT-GYRUS)/VAGINAL BIOPSY;  Surgeon: Ardis Hughs, MD;  [facility];  Service: Urology;  Laterality: N/A;  . Eye surgery    . Colon surgery       Current Outpatient Prescriptions  Medication Sig Dispense Refill  . acetaminophen (TYLENOL) 500 MG tablet Take 1,000 mg by mouth every 6 (six) hours as needed (Pain). Reported on 01/06/2015    . albuterol (PROVENTIL HFA;VENTOLIN HFA) 108 (90 BASE) MCG/ACT inhaler Inhale 2 puffs into the lungs every 6 (six) hours as needed for wheezing.    Marland Kitchen atorvastatin (LIPITOR) 40 MG tablet Take 1 tablet (40 mg total) by mouth daily at 6 PM. 30 tablet 1  . clopidogrel (PLAVIX) 75 MG tablet Take 1 tablet (75 mg total) by mouth daily. 30 tablet 1  . furosemide (LASIX) 20 MG tablet Take 1 tablet (20 mg total) by mouth daily. 30 tablet 1  . Guaifenesin (MUCINEX MAXIMUM STRENGTH) 1200 MG TB12 Take  1 tablet (1,200 mg total) by mouth 2 (two) times daily. 14 each 0  . lisinopril (PRINIVIL,ZESTRIL) 20 MG tablet Take 1 tablet (20 mg total) by mouth daily. 30 tablet 11  . magnesium hydroxide (MILK OF MAGNESIA) 400 MG/5ML suspension Take by mouth daily as needed for mild constipation.    . polyvinyl alcohol (LIQUIFILM TEARS) 1.4 % ophthalmic solution Place 1 drop into both eyes as needed for dry eyes.    Marland Kitchen zolpidem (AMBIEN) 5 MG tablet Take 5 mg by mouth at bedtime as needed.      No current facility-administered medications for this visit.    Allergies:   Indomethacin    Social History:  The patient  reports that she quit smoking about 24  years ago. Her smoking use included Cigarettes. She has a 4.2 pack-year smoking history. She has never used smokeless tobacco. She reports that she does not drink alcohol or use illicit drugs.   Family History:  The patient's family history includes Cancer in her sister; Heart disease in her mother; Stroke in her sister.    ROS:  Please see the history of present illness.    Review of Systems: Constitutional:  denies fever, chills, diaphoresis, appetite change and fatigue.  HEENT: denies photophobia, eye pain, redness, hearing loss, ear pain, congestion, sore throat, rhinorrhea, sneezing, neck pain, neck stiffness and tinnitus.  Respiratory: denies SOB, DOE, cough, chest tightness, and wheezing.  Cardiovascular: denies chest pain, palpitations and leg swelling.  Gastrointestinal: denies nausea, vomiting, abdominal pain, diarrhea, constipation, blood in stool.  Genitourinary: denies dysuria, urgency, frequency, hematuria, flank pain and difficulty urinating.  Musculoskeletal: denies  myalgias, back pain, joint swelling, arthralgias and gait problem.   Skin: denies pallor, rash and wound.  Neurological: denies dizziness, seizures, syncope, weakness, light-headedness, numbness and headaches.   Hematological: denies adenopathy, easy bruising, personal or  family bleeding history.  Psychiatric/ Behavioral: denies suicidal ideation, mood changes, confusion, nervousness, sleep disturbance and agitation.       All other systems are reviewed and negative.    PHYSICAL EXAM: VS:  BP 156/78 mmHg  Pulse 69  Ht 5\' 3"  (1.6 m)  Wt 156 lb 6.4 oz (70.943 kg)  BMI 27.71 kg/m2 , BMI Body mass index is 27.71 kg/(m^2). GEN: Well nourished, well developed, in no acute distress HEENT:  + goiter, R> L  Neck: no JVD, carotid bruits, or masses Cardiac: RRR; no murmurs, rubs, or gallops,no edema  Respiratory:  clear to auscultation bilaterally, normal work of breathing GI: soft, nontender, nondistended, + BS MS: no deformity or atrophy Skin: warm and dry, no rash Neuro:  Strength and sensation are intact Psych: normal   EKG:  EKG is ordered today. NSR with frequent PVCs , NS IVCD    Recent Labs: 04/11/2015: ALT [name]*; TSH 0.665 04/14/2015: Hemoglobin 10.3*; Platelets 214 05/09/2015: BUN 21; Creat 0.75; Potassium 4.1; Sodium 139    Lipid Panel    Component Value Date/Time   CHOL 176 04/11/2015 1704   TRIG 130 04/11/2015 1704   HDL 40* 04/11/2015 1704   CHOLHDL 4.4 04/11/2015 1704   VLDL 26 04/11/2015 1704   LDLCALC 110* 04/11/2015 1704      Wt Readings from Last 3 Encounters:  05/27/15 156 lb 6.4 oz (70.943 kg)  04/20/15 158 lb 6.4 oz (71.85 kg)  04/11/15 176 lb 6.4 oz (80.015 kg)      Other studies Reviewed: Additional studies/ records that were reviewed today include: . Review of the above records demonstrates:    ASSESSMENT AND PLAN:  1. HTN - BP is well controlled.   A bit elevated today because of the lack of her home O2.   She will get the tank fixed.   2. CHF - seems to be well tolerated.  3. Hypothyroidism 4. diverticulitis 5. CVA: I was not able to pass the TEE probe. 30 day monitor did not show atrial fib  She's currently on Plavix. Continue current meds.   Current medicines are reviewed at length with the patient  today.  The patient does not have concerns regarding medicines.  The following changes have been made:  no change  Labs/ tests ordered today include:  No orders of the defined types were placed in this encounter.  Disposition:   FU with me in 6 months  Mertie Moores, MD  05/27/2015 3:[name] PM    [place] [address], [place], [place]  [address] Phone: [phone]; Fax: [phone]

## 2015-05-27 NOTE — Patient Instructions (Signed)

## 2015-05-27 NOTE — Addendum Note (Signed)
Addended by: Emmaline Life on: 05/27/2015 03:34 PM   Modules accepted: Orders

## 2015-06-15 ENCOUNTER — Encounter: Payer: Self-pay | Admitting: Neurology

## 2015-06-15 ENCOUNTER — Ambulatory Visit (INDEPENDENT_AMBULATORY_CARE_PROVIDER_SITE_OTHER): Payer: Medicare Other | Admitting: Neurology

## 2015-06-15 VITALS — BP 137/64 | HR 56 | Ht 63.0 in | Wt 157.5 lb

## 2015-06-15 DIAGNOSIS — E785 Hyperlipidemia, unspecified: Secondary | ICD-10-CM | POA: Diagnosis not present

## 2015-06-15 DIAGNOSIS — C801 Malignant (primary) neoplasm, unspecified: Secondary | ICD-10-CM

## 2015-06-15 DIAGNOSIS — IMO0002 Reserved for concepts with insufficient information to code with codable children: Secondary | ICD-10-CM | POA: Insufficient documentation

## 2015-06-15 DIAGNOSIS — C799 Secondary malignant neoplasm of unspecified site: Secondary | ICD-10-CM | POA: Diagnosis not present

## 2015-06-15 DIAGNOSIS — I10 Essential (primary) hypertension: Secondary | ICD-10-CM | POA: Diagnosis not present

## 2015-06-15 DIAGNOSIS — I63412 Cerebral infarction due to embolism of left middle cerebral artery: Secondary | ICD-10-CM | POA: Diagnosis not present

## 2015-06-15 NOTE — Progress Notes (Addendum)
STROKE NEUROLOGY FOLLOW UP NOTE  NAME: [name] DOB: February 23, 1925  REASON FOR VISIT: stroke follow up HISTORY FROM: pt and daughter and chart  Today we had the pleasure of seeing [name] in follow-up at our Neurology Clinic. Pt was accompanied by daughter.   History Summary [name] is a 82 y.o. female with history of HTN, metastatic squamous cell carcinoma s/p radiation, and right shoulder surgery admitted on 04/11/15 for right hand weakness. MRI showed acute left pre-central gyrus small infarct as well as old right MCA/PCA infarct. Pt denies any previous stroke. MRA, CUS, TTE, and EEG unremarkable. TEE not able to tolerate. LDL 110 and A1C 5.7. She was discharged with plavix and lipitor. Recommended 30 day cardiac monitoring and consider RESPECT ESUS trial as outpt  Interval History During the interval time, the patient has been doing well. Her right hand weakness much improved, still has mild dexterity difficulty but largely back to baseline. She had 30 day cardiac event monitoring and showed no afib. She had metastatic squamous cell carcinoma and finished radiation 09/2013, so far still following with oncology. As per daughter, cancer stable but not cured yet. BP 137/64.     REVIEW OF SYSTEMS: Full 14 system review of systems performed and notable only for those listed below and in HPI above, all others are negative:  Constitutional:  fatigue Cardiovascular:  Ear/Nose/Throat:   Skin:  Eyes:  Eye itching, light sensitivity Respiratory:  SOB Gastroitestinal:  constipation Genitourinary: difficulty urination Hematology/Lymphatic:  Bruise/bleeding easily Endocrine:  Musculoskeletal:  Joint pain, joint swelling, walking difficulty Allergy/Immunology:   Neurological:  Dizziness, numbness Psychiatric: agitation Sleep: restless leg, insomnia  The following represents the patient's updated allergies and side effects list: Allergies  Allergen Reactions  . Indomethacin  Anaphylaxis and Other (See Comments)    "took it fine for awhile; one day I stopped breathing and I ended up in the hospital" (01/02/2012)    The neurologically relevant items on the patient's problem list were reviewed on today's visit.  Neurologic Examination  A problem focused neurological exam (12 or more points of the single system neurologic examination, vital signs counts as 1 point, cranial nerves count for 8 points) was performed.  Blood pressure 137/64, pulse 56, height 5\' 3"  (1.6 m), weight 157 lb 8 oz (71.442 kg).  General - Well nourished, well developed, in mild respiratory distress, on portable O2.  Ophthalmologic - fundi not visualized due to mild distress.  Cardiovascular - Regular rate and rhythm.  Mental Status -  Level of arousal and orientation to time, place, and person were intact. Language including expression, naming, repetition, comprehension was assessed and found intact. Fund of Knowledge was assessed and was intact.  Cranial Nerves II - XII - II - Visual field intact OU. III, IV, VI - Extraocular movements intact. V - Facial sensation intact bilaterally. VII - Facial movement intact bilaterally. VIII - Hearing & vestibular intact bilaterally. X - Palate elevates symmetrically. XI - Chin turning & shoulder shrug intact bilaterally. XII - Tongue protrusion intact  Motor Strength - The patient's strength was normal in all extremities except right hand mildly decreased dexterity, right shoulder limited ROM due to previous surgery and pronator drift was absent. Bulk was normal and fasciculations were absent.  Motor Tone - Muscle tone was assessed at the neck and appendages and was normal.  Reflexes - The patient's reflexes were symmetrical in all extremities and she had no pathological reflexes.  Sensory -  Light touch, temperature/pinprick were assessed and were symmetrical.   Coordination - The patient had normal movements in the hands and feet with  no ataxia or dysmetria. Tremor was absent.  Gait and Station -  Walk with walker, stooped posturing, steady.   Functional score  mRS = 2   0 - No symptoms.   1 - No significant disability. Able to carry out all usual activities, despite some symptoms.   2 - Slight disability. Able to look after own affairs without assistance, but unable to carry out all previous activities.   3 - Moderate disability. Requires some help, but able to walk unassisted.   4 - Moderately severe disability. Unable to attend to own bodily needs without assistance, and unable to walk unassisted.   5 - Severe disability. Requires constant nursing care and attention, bedridden, incontinent.   6 - Dead.   NIH Stroke Scale = 0   Data reviewed: I personally reviewed the images and agree with the radiology interpretations.  Dg Chest 2 View  04/11/2015 IMPRESSION: No acute abnormalities. Emphysema.   Ct Head Wo Contrast  04/11/2015 IMPRESSION: Old right occipital infarct, stable since 2014. No acute intracranial abnormality. Atrophy, chronic microvascular disease.   Mr Brain Wo Contrast  04/12/2015 ADDENDUM REPORT: 04/12/2015 12:40 ADDENDUM: We were asked by Neurology to review asymmetry of signal on diffusion sequences in the LEFT precentral cortex with regard to the patient's acute RIGHT hand weakness. Low-level increased signal on DWI axial appears facilitated on ADC in the cortex, but could be mildly restricted in the subcortical white matter. Correlate clinically for an acute versus subacute insult. Electronically Signed By: Staci Righter M.D. On: 04/12/2015 12:40  04/12/2015 IMPRESSION: Atrophy and small vessel disease. No acute intracranial findings. No significant intracranial large vessel stenosis or occlusion. Electronically Signed: By: Staci Righter M.D. On: 04/11/2015 13:57   Carotid Doppler Bilateral: 1-39% ICA stenosis. Vertebral artery flow is antegrade.  2D Echocardiogram -  Compared to a prior study in 2016, the LVEF is improved to  55-60%, there is diastolic dysfunction with elevated LV filling  pressure and moderate pulmonary hypertension with RVSP of 55  mmHg.  EEG - This normal EEG is recorded in the waking and sleep state. There was no seizure or seizure predisposition recorded on this study. Please note that a normal EEG does not preclude the possibility of epilepsy.   TEE - attempted, not tolerating, aborted  30 day cardiac event monitoring - no afib  Component     Latest Ref Rng 04/11/2015  Cholesterol     0 - 200 mg/dL 176  Triglycerides     <150 mg/dL 130  HDL Cholesterol     >40 mg/dL 40 (L)  Total CHOL/HDL Ratio      4.4  VLDL     0 - 40 mg/dL 26  LDL (calc)     0 - 99 mg/dL 110 (H)  Hemoglobin A1C     4.8 - 5.6 % 5.7 (H)  Mean Plasma Glucose      117  TSH     0.350 - 4.500 uIU/mL 0.665  T4,Free(Direct)     0.61 - 1.12 ng/dL 0.72  Triiodothyronine,Free,Serum     2.0 - 4.4 pg/mL 2.6    Assessment: As you may recall, she is a 82 y.o. African American female with PMH of HTN, metastatic squamous cell carcinoma s/p radiation, and right shoulder surgery admitted on 04/11/15 for right hand weakness. MRI showed acute left pre-central gyrus  small infarct as well as old right MCA/PCA infarct. Pt denies any previous stroke. MRA, CUS, TTE, and EEG unremarkable. TEE not able to tolerate. LDL 110 and A1C 5.7. She was discharged with plavix and lipitor. 30 day cardiac monitoring showed no afib and she is not RESPECT ESUS candidate due to metastatic squamous cell carcinoma. During the interval time, her right hand weakness much improved, still has mild dexterity difficulty but largely back to baseline.   Plan:  - continue plavix and lipitor for stroke prevention - check BP at home and record - Follow up with your primary care physician for stroke risk factor modification. Recommend maintain blood pressure goal <130/80, diabetes with hemoglobin A1c  goal below 6.5% and lipids with LDL cholesterol goal below 70 mg/dL.  - continue follow up with cardiology and oncology - self exercise at home - not RESPECT ESUS candidate due to metastatic squamous cell carcinoma - follow up in 4 months.   I spent more than 25 minutes of face to face time with the patient. Greater than 50% of time was spent in counseling and coordination of care. We discussed stroke risk factor modification, eligibility of RESPECT ESUS trial, and home self exercise.   No orders of the defined types were placed in this encounter.    No orders of the defined types were placed in this encounter.    Patient Instructions  - continue plavix and lipitor for stroke prevention - check BP at home and record - Follow up with your primary care physician for stroke risk factor modification. Recommend maintain blood pressure goal <130/80, diabetes with hemoglobin A1c goal below 6.5% and lipids with LDL cholesterol goal below 70 mg/dL.  - continue follow up with cardiology and oncology - self exercise at home - will let you know if you are eligible for research study - follow up in 4 months.     Rosalin Hawking, MD PhD [facility] [address] [place], [place] [address] [phone]

## 2015-06-15 NOTE — Patient Instructions (Signed)
-   continue plavix and lipitor for stroke prevention - check BP at home and record - Follow up with your primary care physician for stroke risk factor modification. Recommend maintain blood pressure goal <130/80, diabetes with hemoglobin A1c goal below 6.5% and lipids with LDL cholesterol goal below 70 mg/dL.  - continue follow up with cardiology and oncology - self exercise at home - will let you know if you are eligible for research study - follow up in 4 months.

## 2015-07-07 ENCOUNTER — Ambulatory Visit: Payer: Medicare Other | Admitting: Radiation Oncology

## 2015-07-14 ENCOUNTER — Encounter: Payer: Self-pay | Admitting: Radiation Oncology

## 2015-07-14 ENCOUNTER — Other Ambulatory Visit (HOSPITAL_COMMUNITY)
Admission: RE | Admit: 2015-07-14 | Discharge: 2015-07-14 | Disposition: A | Discharge status: Home / Self Care | Payer: Medicare Other | Source: Ambulatory Visit | Attending: Radiation Oncology | Admitting: Radiation Oncology

## 2015-07-14 ENCOUNTER — Ambulatory Visit
Admission: RE | Admit: 2015-07-14 | Discharge: 2015-07-14 | Disposition: A | Discharge status: Home / Self Care | Payer: Medicare Other | Source: Ambulatory Visit | Attending: Radiation Oncology | Admitting: Radiation Oncology

## 2015-07-14 VITALS — BP 134/51 | HR 59 | Temp 98.2°F | Ht 63.0 in | Wt 154.7 lb

## 2015-07-14 DIAGNOSIS — C4492 Squamous cell carcinoma of skin, unspecified: Secondary | ICD-10-CM

## 2015-07-14 DIAGNOSIS — Z8673 Personal history of transient ischemic attack (TIA), and cerebral infarction without residual deficits: Secondary | ICD-10-CM | POA: Diagnosis not present

## 2015-07-14 DIAGNOSIS — C52 Malignant neoplasm of vagina: Secondary | ICD-10-CM | POA: Diagnosis present

## 2015-07-14 DIAGNOSIS — C801 Malignant (primary) neoplasm, unspecified: Secondary | ICD-10-CM | POA: Insufficient documentation

## 2015-07-14 DIAGNOSIS — Z124 Encounter for screening for malignant neoplasm of cervix: Secondary | ICD-10-CM | POA: Diagnosis present

## 2015-07-14 DIAGNOSIS — C679 Malignant neoplasm of bladder, unspecified: Secondary | ICD-10-CM | POA: Diagnosis not present

## 2015-07-14 NOTE — Progress Notes (Addendum)
[name] is here for follow up.  She denies having pain.  She had a stroke in April and says she is feeling good except for "feeling a little funny in my head when I lay down."  She denies having any new bladder/bowel issues.  She does report having occasional constipation.  She denies having any vaginal/rectal bleeding or discharge.  She reports feeling tired all the time.  There were no vitals taken for this visit. Wt Readings from Last 3 Encounters:  06/15/15 157 lb 8 oz (71.442 kg)  05/27/15 156 lb 6.4 oz (70.943 kg)  04/20/15 158 lb 6.4 oz (71.85 kg)

## 2015-07-14 NOTE — Addendum Note (Signed)
Encounter addended by: Jacqulyn Liner, RN on: 07/14/2015  4:23 PM<BR>     Documentation filed: Charges VN

## 2015-07-14 NOTE — Progress Notes (Signed)
Radiation Oncology         [phone] ________________________________  Name: [name] MRN: [id number]  Date: 07/14/2015  DOB: [birth year]    Follow-Up Visit Note  CC: Leonard Downing, MD  Everitt Amber, MD    ICD-9-CM ICD-10-CM   1. Squamous cell carcinoma of unknown origin (HCC) 199.1 C80.1     Diagnosis:    Squamous cell carcinoma of unknown origin presenting in the bladder and upper vaginal area  Interval Since Last Radiation: 1 year and 10 months   08/10/13 - 09/15/13: 55 gray in 25 fractions (2.2 gray per fraction) treatment was directed at the vaginal/bladder mass  Narrative:  The patient returns today for routine follow-up. She denies having pain. She had a stroke in April and says she is feeling good except for "feeling a little funny in my head when I lay down." She reports right hand weakness from the stroke. She denies having any new bladder/bowel issues. She does report having occasional constipation. She denies having any vaginal/rectal bleeding or discharge. She reports feeling tired all the time. The patient has an abdominal hernia, but has declined surgery for it at this time. The patient's last Pap smear was on 07/08/2014.  ALLERGIES:  is allergic to indomethacin.  Meds: Current Outpatient Prescriptions  Medication Sig Dispense Refill  . albuterol (PROVENTIL HFA;VENTOLIN HFA) 108 (90 BASE) MCG/ACT inhaler Inhale 2 puffs into the lungs every 6 (six) hours as needed for wheezing.    Marland Kitchen atorvastatin (LIPITOR) 40 MG tablet Take 1 tablet (40 mg total) by mouth daily at 6 PM. 30 tablet 11  . clopidogrel (PLAVIX) 75 MG tablet Take 1 tablet (75 mg total) by mouth daily. 30 tablet 11  . furosemide (LASIX) 20 MG tablet Take 1 tablet (20 mg total) by mouth daily. 30 tablet 11  . lisinopril (PRINIVIL,ZESTRIL) 20 MG tablet Take 1 tablet (20 mg total) by mouth daily. 30 tablet 11  . magnesium hydroxide (MILK OF MAGNESIA) 400 MG/5ML suspension Take by mouth daily as needed  for mild constipation.    . polyvinyl alcohol (LIQUIFILM TEARS) 1.4 % ophthalmic solution Place 1 drop into both eyes as needed for dry eyes.    Marland Kitchen acetaminophen (TYLENOL) 500 MG tablet Take 1,000 mg by mouth every 6 (six) hours as needed (Pain). Reported on 07/14/2015    . Guaifenesin (MUCINEX MAXIMUM STRENGTH) 1200 MG TB12 Take 1 tablet (1,200 mg total) by mouth 2 (two) times daily. (Patient not taking: Reported on 07/14/2015) 14 each 0   No current facility-administered medications for this encounter.    Physical Findings: The patient is in no acute distress. Patient is alert and oriented.  height is 5\' 3"  (1.6 m) and weight is 154 lb 11.2 oz (70.171 kg). Her oral temperature is 98.2 F (36.8 C). Her blood pressure is 134/51 and her pulse is 59. Her oxygen saturation is 96%.    Lungs are clear to auscultation bilaterally. Heart has regular rate and rhythm. No palpable cervical, supraclavicular, or axillary adenopathy. Decreased range of motion in her right shoulder and slightly decreased right hand grip strength from her CVA. Abdomen soft, non-tender. Reducible hernia in the abdominal region. No inguinal adenopathy appreciated.  Gynecological: On pelvic examination the external genitalia were unremarkable. A speculum exam was performed. There are no mucosal lesions noted in the vaginal vault. A Pap smear was obtained of the proximal vagina. On bimanual and rectovaginal examination there were no pelvic masses appreciated.   Lab Findings: Lab Results  Component Value Date   WBC 4.1 04/14/2015   HGB 10.3* 04/14/2015   HCT 29.8* 04/14/2015   MCV 98.3 04/14/2015   PLT 214 04/14/2015    Radiographic Findings: No results found.  Impression:  No evidence of recurrence on physical exam today. Pap smear pending.  Plan:  Routine follow up with radiation oncology in 6 months. We discussed getting a abdominal hernia binder and she will f/u with her PCP for this issue. I encouraged her to follow  up with gyn/onc.  -----------------------------------  Blair Promise, PhD, MD  This document serves as a record of services personally performed by Gery Pray, MD. It was created on his behalf by Darcus Austin, a trained medical scribe. The creation of this record is based on the scribe's personal observations and the provider's statements to them. This document has been checked and approved by the attending provider.

## 2015-07-18 ENCOUNTER — Ambulatory Visit: Payer: Medicare Other | Admitting: Cardiovascular Disease

## 2015-07-19 LAB — CYTOLOGY - PAP

## 2015-07-20 ENCOUNTER — Telehealth: Payer: Self-pay | Admitting: Oncology

## 2015-07-20 NOTE — Telephone Encounter (Signed)
Called [name] and notified her of her good pap results per Dr. Sondra Come.  She verbalized understanding and agreement.

## 2015-07-26 ENCOUNTER — Other Ambulatory Visit: Payer: Self-pay | Admitting: Family Medicine

## 2015-07-26 DIAGNOSIS — E2839 Other primary ovarian failure: Secondary | ICD-10-CM

## 2015-08-02 ENCOUNTER — Other Ambulatory Visit: Payer: Medicare Other

## 2015-08-23 ENCOUNTER — Emergency Department (HOSPITAL_COMMUNITY): Payer: Medicare Other

## 2015-08-23 ENCOUNTER — Inpatient Hospital Stay (HOSPITAL_COMMUNITY)
Admission: EM | Admit: 2015-08-23 | Discharge: 2015-08-28 | DRG: 871 | Disposition: A | Discharge status: Home Health | Payer: Medicare Other | Attending: Internal Medicine | Admitting: Internal Medicine

## 2015-08-23 ENCOUNTER — Encounter (HOSPITAL_COMMUNITY): Payer: Self-pay | Admitting: Emergency Medicine

## 2015-08-23 DIAGNOSIS — A419 Sepsis, unspecified organism: Secondary | ICD-10-CM | POA: Diagnosis not present

## 2015-08-23 DIAGNOSIS — K219 Gastro-esophageal reflux disease without esophagitis: Secondary | ICD-10-CM | POA: Diagnosis present

## 2015-08-23 DIAGNOSIS — E039 Hypothyroidism, unspecified: Secondary | ICD-10-CM | POA: Diagnosis present

## 2015-08-23 DIAGNOSIS — J189 Pneumonia, unspecified organism: Secondary | ICD-10-CM | POA: Diagnosis not present

## 2015-08-23 DIAGNOSIS — Z87891 Personal history of nicotine dependence: Secondary | ICD-10-CM

## 2015-08-23 DIAGNOSIS — J440 Chronic obstructive pulmonary disease with acute lower respiratory infection: Secondary | ICD-10-CM | POA: Diagnosis present

## 2015-08-23 DIAGNOSIS — Z7982 Long term (current) use of aspirin: Secondary | ICD-10-CM

## 2015-08-23 DIAGNOSIS — J9621 Acute and chronic respiratory failure with hypoxia: Secondary | ICD-10-CM | POA: Diagnosis present

## 2015-08-23 DIAGNOSIS — E785 Hyperlipidemia, unspecified: Secondary | ICD-10-CM | POA: Diagnosis present

## 2015-08-23 DIAGNOSIS — I4892 Unspecified atrial flutter: Secondary | ICD-10-CM | POA: Diagnosis not present

## 2015-08-23 DIAGNOSIS — D649 Anemia, unspecified: Secondary | ICD-10-CM | POA: Diagnosis present

## 2015-08-23 DIAGNOSIS — I5032 Chronic diastolic (congestive) heart failure: Secondary | ICD-10-CM | POA: Diagnosis not present

## 2015-08-23 DIAGNOSIS — I6330 Cerebral infarction due to thrombosis of unspecified cerebral artery: Secondary | ICD-10-CM

## 2015-08-23 DIAGNOSIS — R7989 Other specified abnormal findings of blood chemistry: Secondary | ICD-10-CM | POA: Diagnosis not present

## 2015-08-23 DIAGNOSIS — I480 Paroxysmal atrial fibrillation: Secondary | ICD-10-CM | POA: Diagnosis not present

## 2015-08-23 DIAGNOSIS — Z7902 Long term (current) use of antithrombotics/antiplatelets: Secondary | ICD-10-CM | POA: Diagnosis not present

## 2015-08-23 DIAGNOSIS — Z888 Allergy status to other drugs, medicaments and biological substances status: Secondary | ICD-10-CM | POA: Diagnosis not present

## 2015-08-23 DIAGNOSIS — Z9981 Dependence on supplemental oxygen: Secondary | ICD-10-CM

## 2015-08-23 DIAGNOSIS — Z79899 Other long term (current) drug therapy: Secondary | ICD-10-CM | POA: Diagnosis not present

## 2015-08-23 DIAGNOSIS — I10 Essential (primary) hypertension: Secondary | ICD-10-CM | POA: Diagnosis present

## 2015-08-23 DIAGNOSIS — J449 Chronic obstructive pulmonary disease, unspecified: Secondary | ICD-10-CM | POA: Diagnosis present

## 2015-08-23 DIAGNOSIS — J438 Other emphysema: Secondary | ICD-10-CM | POA: Diagnosis not present

## 2015-08-23 DIAGNOSIS — Z8673 Personal history of transient ischemic attack (TIA), and cerebral infarction without residual deficits: Secondary | ICD-10-CM | POA: Diagnosis not present

## 2015-08-23 DIAGNOSIS — I110 Hypertensive heart disease with heart failure: Secondary | ICD-10-CM | POA: Diagnosis present

## 2015-08-23 DIAGNOSIS — R778 Other specified abnormalities of plasma proteins: Secondary | ICD-10-CM | POA: Diagnosis present

## 2015-08-23 LAB — PROTIME-INR
INR: 1.53
Prothrombin Time: 18.5 seconds — ABNORMAL HIGH (ref 11.4–15.2)

## 2015-08-23 LAB — BASIC METABOLIC PANEL
Anion gap: 9 (ref 5–15)
BUN: 20 mg/dL (ref 6–20)
CALCIUM: 8.7 mg/dL — AB (ref 8.9–10.3)
CO2: 21 mmol/L — AB (ref 22–32)
CREATININE: 1.03 mg/dL — AB (ref 0.44–1.00)
Chloride: 104 mmol/L (ref 101–111)
GFR, EST AFRICAN AMERICAN: 55 mL/min — AB (ref >60)
GFR, EST NON AFRICAN AMERICAN: 47 mL/min — AB (ref >60)
GLUCOSE: 129 mg/dL — AB (ref 65–99)
Potassium: 3.9 mmol/L (ref 3.5–5.1)
Sodium: 134 mmol/L — ABNORMAL LOW (ref 135–145)

## 2015-08-23 LAB — I-STAT ARTERIAL BLOOD GAS, ED
ACID-BASE DEFICIT: 1 mmol/L (ref 0.0–2.0)
Bicarbonate: 23 mEq/L (ref 20.0–24.0)
O2 Saturation: 100 %
PCO2 ART: 36 mmHg (ref 35.0–45.0)
PH ART: 7.417 (ref 7.350–7.450)
PO2 ART: 186 mmHg — AB (ref 80.0–100.0)
Patient temperature: 100.4
TCO2: 24 mmol/L (ref 0–100)

## 2015-08-23 LAB — CBC
HCT: 30.7 % — ABNORMAL LOW (ref 36.0–46.0)
Hemoglobin: 11 g/dL — ABNORMAL LOW (ref 12.0–15.0)
MCH: 34.8 pg — AB (ref 26.0–34.0)
MCHC: 35.8 g/dL (ref 30.0–36.0)
MCV: 97.2 fL (ref 78.0–100.0)
Platelets: 302 10*3/uL (ref 150–400)
RBC: 3.16 MIL/uL — ABNORMAL LOW (ref 3.87–5.11)
RDW: 12.9 % (ref 11.5–15.5)
WBC: 13.3 10*3/uL — ABNORMAL HIGH (ref 4.0–10.5)

## 2015-08-23 LAB — I-STAT CG4 LACTIC ACID, ED: Lactic Acid, Venous: 1.27 mmol/L (ref 0.5–1.9)

## 2015-08-23 LAB — I-STAT TROPONIN, ED: TROPONIN I, POC: 0.06 ng/mL (ref 0.00–0.08)

## 2015-08-23 LAB — PROCALCITONIN: PROCALCITONIN: 0.16 ng/mL

## 2015-08-23 LAB — APTT: APTT: 48 s — AB (ref 24–36)

## 2015-08-23 LAB — LACTIC ACID, PLASMA: LACTIC ACID, VENOUS: 1 mmol/L (ref 0.5–1.9)

## 2015-08-23 LAB — TROPONIN I: TROPONIN I: 0.08 ng/mL — AB (ref <0.03)

## 2015-08-23 MED ORDER — ACETAMINOPHEN 325 MG PO TABS: 650.0000 mg | ORAL_TABLET | Freq: Four times a day (QID) | ORAL | Status: DC | PRN

## 2015-08-23 MED ORDER — ASPIRIN 325 MG PO TABS
325.0000 mg | ORAL_TABLET | Freq: Every day | ORAL | Status: DC
  Administered 2015-08-24 – 2015-08-27 (×4): 325 mg via ORAL
  Filled 2015-08-23 (×4): qty 1

## 2015-08-23 MED ORDER — DEXTROSE 5 % IV SOLN
500.0000 mg | Freq: Once | INTRAVENOUS | Status: AC
  Administered 2015-08-23: 500 mg via INTRAVENOUS
  Filled 2015-08-23: qty 500

## 2015-08-23 MED ORDER — ALBUTEROL SULFATE (2.5 MG/3ML) 0.083% IN NEBU
5.0000 mg | INHALATION_SOLUTION | Freq: Once | RESPIRATORY_TRACT | Status: AC
  Administered 2015-08-23: 5 mg via RESPIRATORY_TRACT
  Filled 2015-08-23: qty 6

## 2015-08-23 MED ORDER — OXYCODONE-ACETAMINOPHEN 5-325 MG PO TABS
1.0000 | ORAL_TABLET | ORAL | Status: DC | PRN
  Administered 2015-08-24 – 2015-08-25 (×2): 1 via ORAL
  Filled 2015-08-23 (×2): qty 1

## 2015-08-23 MED ORDER — ASPIRIN 325 MG PO TABS: 650.0000 mg | ORAL_TABLET | Freq: Four times a day (QID) | ORAL | Status: DC | PRN

## 2015-08-23 MED ORDER — ENOXAPARIN SODIUM 40 MG/0.4ML ~~LOC~~ SOLN
40.0000 mg | Freq: Every day | SUBCUTANEOUS | Status: DC
  Administered 2015-08-24 – 2015-08-27 (×4): 40 mg via SUBCUTANEOUS
  Filled 2015-08-23 (×4): qty 0.4

## 2015-08-23 MED ORDER — MAGNESIUM HYDROXIDE 400 MG/5ML PO SUSP: 15.0000 mL | Freq: Every day | ORAL | Status: DC | PRN

## 2015-08-23 MED ORDER — CLOPIDOGREL BISULFATE 75 MG PO TABS
75.0000 mg | ORAL_TABLET | Freq: Every day | ORAL | Status: DC
  Administered 2015-08-24 – 2015-08-27 (×4): 75 mg via ORAL
  Filled 2015-08-23 (×4): qty 1

## 2015-08-23 MED ORDER — ATORVASTATIN CALCIUM 40 MG PO TABS
40.0000 mg | ORAL_TABLET | Freq: Every day | ORAL | Status: DC
  Administered 2015-08-24 – 2015-08-27 (×4): 40 mg via ORAL
  Filled 2015-08-23 (×5): qty 1

## 2015-08-23 MED ORDER — DEXTROSE 5 % IV SOLN
500.0000 mg | INTRAVENOUS | Status: DC
  Administered 2015-08-24: 500 mg via INTRAVENOUS
  Filled 2015-08-23 (×2): qty 500

## 2015-08-23 MED ORDER — POLYVINYL ALCOHOL 1.4 % OP SOLN: 1.0000 [drp] | OPHTHALMIC | Status: DC | PRN

## 2015-08-23 MED ORDER — DEXTROSE 5 % IV SOLN
1.0000 g | INTRAVENOUS | Status: DC
  Administered 2015-08-24 – 2015-08-27 (×4): 1 g via INTRAVENOUS
  Filled 2015-08-23 (×5): qty 10

## 2015-08-23 MED ORDER — BUDESONIDE-FORMOTEROL FUMARATE 80-4.5 MCG/ACT IN AERO
2.0000 | INHALATION_SPRAY | Freq: Two times a day (BID) | RESPIRATORY_TRACT | Status: DC
  Administered 2015-08-24 – 2015-08-28 (×8): 2 via RESPIRATORY_TRACT
  Filled 2015-08-23: qty 6.9

## 2015-08-23 MED ORDER — ALBUTEROL SULFATE (2.5 MG/3ML) 0.083% IN NEBU: 5.0000 mg | INHALATION_SOLUTION | RESPIRATORY_TRACT | Status: DC | PRN

## 2015-08-23 MED ORDER — LISINOPRIL 20 MG PO TABS
20.0000 mg | ORAL_TABLET | Freq: Every day | ORAL | Status: DC
  Administered 2015-08-24 – 2015-08-28 (×5): 20 mg via ORAL
  Filled 2015-08-23 (×5): qty 1

## 2015-08-23 MED ORDER — SODIUM CHLORIDE 0.9 % IV BOLUS (SEPSIS)
1000.0000 mL | Freq: Once | INTRAVENOUS | Status: AC
  Administered 2015-08-23: 1000 mL via INTRAVENOUS

## 2015-08-23 MED ORDER — DM-GUAIFENESIN ER 30-600 MG PO TB12
1.0000 | ORAL_TABLET | Freq: Two times a day (BID) | ORAL | Status: DC
  Administered 2015-08-24 – 2015-08-28 (×9): 1 via ORAL
  Filled 2015-08-23 (×9): qty 1

## 2015-08-23 MED ORDER — IPRATROPIUM BROMIDE 0.02 % IN SOLN
0.5000 mg | Freq: Once | RESPIRATORY_TRACT | Status: AC
  Administered 2015-08-23: 0.5 mg via RESPIRATORY_TRACT
  Filled 2015-08-23: qty 2.5

## 2015-08-23 MED ORDER — NITROGLYCERIN 0.4 MG SL SUBL: 0.4000 mg | SUBLINGUAL_TABLET | SUBLINGUAL | Status: DC | PRN

## 2015-08-23 MED ORDER — DEXTROSE 5 % IV SOLN
1.0000 g | Freq: Once | INTRAVENOUS | Status: AC
  Administered 2015-08-23: 1 g via INTRAVENOUS
  Filled 2015-08-23: qty 10

## 2015-08-23 NOTE — ED Notes (Signed)
Pt in xray

## 2015-08-23 NOTE — ED Provider Notes (Addendum)
[facility] DEPT Provider Note   CSN: [id number] Arrival date & time: 08/23/15  1919     History   Chief Complaint Chief Complaint  Patient presents with  . Fever  . Cough  . Shortness of Breath    HPI [name] is a 82 y.o. female.  HPI Patient presents the emergency department with increasing shortness of breath and productive cough for the past several days.  She was seen by her doctor yesterday and started on unknown antibiotic.  She does have a history of COPD as well as congestive heart failure.  She reports productive cough.  She is oxygen dependent COPD on 2 L.  She's felt like she is needed to turn her oxygen up.  She's tried her rescue inhaler this week without improvement in her symptoms.  Chills without documented fever.  Reports nausea.  Denies vomiting.  No diarrhea.  No dysuria or urinary frequency.  Denies abdominal pain.  Symptoms are moderate in severity.  She's noted to be tachypneic at the bedside.  Past Medical History:  Diagnosis Date  . Anemia    years ago  . Arthritis    "right shoulder" (01/02/2012)  . Bladder mass   . CAP (community acquired pneumonia) 01/02/2012   Lower lobe   . CHF (congestive heart failure) ([place])   . COPD (chronic obstructive pulmonary disease) ([place])   . Difficulty sleeping   . Diverticulitis of colon with perforation 01/14/2012   That is post sigmoid resection with [facility] pouch and end colostomy   . GERD (gastroesophageal reflux disease)    occasional  . Goiter    radioactive iodine ablation/notes 01/02/2012  . History of kidney stones    X 1  . Hypertension   . Hypothyroidism    "I have taken Synthroid before" (01/02/2012)   HAS HAD RADIOACTIVE IODINE TX 2 YR S AGO  . Intra-abdominal abscess ([place]) 01/14/2012  . Pneumatosis of intestines 01/14/2012  . Pneumonia 01/02/2012; ? 2009  . Radiation 08/10/13-09/15/13   57 gray to vaginal/bladder mass  . SOB (shortness of breath)    'all the time" (01/02/2012)  . Stroke ([place])   . UTI  (lower urinary tract infection) 01/03/2012    Patient Active Problem List   Diagnosis Date Noted  . Essential hypertension 06/15/2015  . Squamous cell carcinoma, metastatic ([place]) 06/15/2015  . Acute CVA (cerebrovascular accident) ([place]) 04/13/2015  . Cerebrovascular accident (CVA) due to embolism of left middle cerebral artery ([place])   . HLD (hyperlipidemia)   . Cerebral thrombosis with cerebral infarction 04/12/2015  . Stroke-like symptoms   . Dysarthria 04/11/2015  . Absolute anemia 09/04/2014  . Elevated troponin 02/14/2014  . Dyspnea   . CAP (community acquired pneumonia) 02/13/2014  . Overweight (BMI 25.0-29.9) 02/13/2014  . Goiter 02/13/2014  . Acute on chronic diastolic heart failure ([place]) 02/13/2014  . Sepsis due to undetermined organism with acute respiratory failure ([place]) 02/13/2014  . Squamous cell carcinoma of unknown origin ([place]) 07/24/2013  . S/P colostomy takedown 10/21/2012 10/23/2012  . Diverticulosis of colon 03/07/2012  . Pulmonary nodules 01/23/2012  . Hematuria, microscopic 01/14/2012  . Hyponatremia 01/14/2012  . Asymptomatic PVCs 01/03/2012  . H/O: hypothyroidism 01/03/2012  . COPD (chronic obstructive pulmonary disease) ([place]) 01/02/2012  . Hypertension 01/02/2012  . Chronic combined systolic and diastolic congestive heart failure ([place]) 01/02/2012  . Hx of goiter 01/02/2012    Past Surgical History:  Procedure Laterality Date  . ABDOMINAL HYSTERECTOMY  1980's  . APPENDECTOMY  1980's   "  when they did hysterectomy" (01/02/2012)  . CATARACT EXTRACTION W/ INTRAOCULAR LENS  IMPLANT, BILATERAL  ?1990's   [place]  . COLON SURGERY    . COLOSTOMY N/A 03/12/2012   Procedure: COLOSTOMY;  Surgeon: Ralene Ok, MD;  Location: [facility];  Service: General;  Laterality: N/A;  . COLOSTOMY REVISION N/A 03/12/2012   Procedure: COLON RESECTION SIGMOID ;  Surgeon: Ralene Ok, MD;  Location: [facility];  Service: General;  Laterality: N/A;  . COLOSTOMY TAKEDOWN N/A 10/21/2012    Procedure: LAPAROSCOPIC COLOSTOMY TAKEDOWN AND HARTMANS ANASTOMOSIS, rigid proctoscopy;  Surgeon: Ralene Ok, MD;  Location: [facility];  Service: General;  Laterality: N/A;  . EYE SURGERY    . LYSIS OF ADHESION N/A 10/21/2012   Procedure: LYSIS OF ADHESION;  Surgeon: Ralene Ok, MD;  Location: [facility];  Service: General;  Laterality: N/A;  . TRANSURETHRAL RESECTION OF BLADDER TUMOR WITH GYRUS (TURBT-GYRUS) N/A 06/30/2013   Procedure: TRANSURETHRAL RESECTION OF BLADDER TUMOR WITH GYRUS (TURBT-GYRUS)/VAGINAL BIOPSY;  Surgeon: Ardis Hughs, MD;  Location: [facility];  Service: Urology;  Laterality: N/A;    OB History    No data available       Home Medications    Prior to Admission medications   Medication Sig Start Date End Date Taking? Authorizing Provider  acetaminophen (TYLENOL) 500 MG tablet Take 1,000 mg by mouth every 6 (six) hours as needed for mild pain. Reported on 07/14/2015   Yes Historical Provider, MD  albuterol (PROVENTIL HFA;VENTOLIN HFA) 108 (90 BASE) MCG/ACT inhaler Inhale 2 puffs into the lungs every 6 (six) hours as needed for wheezing.   Yes Historical Provider, MD  aspirin 325 MG tablet Take 650 mg by mouth every 6 (six) hours as needed for mild pain.   Yes Historical Provider, MD  atorvastatin (LIPITOR) 40 MG tablet Take 1 tablet (40 mg total) by mouth daily at 6 PM. 05/27/15  Yes Thayer Headings, MD  clopidogrel (PLAVIX) 75 MG tablet Take 1 tablet (75 mg total) by mouth daily. 05/27/15  Yes Thayer Headings, MD  furosemide (LASIX) 20 MG tablet Take 1 tablet (20 mg total) by mouth daily. 05/27/15  Yes Thayer Headings, MD  lisinopril (PRINIVIL,ZESTRIL) 20 MG tablet Take 1 tablet (20 mg total) by mouth daily. 05/27/15  Yes Thayer Headings, MD  magnesium hydroxide (MILK OF MAGNESIA) 400 MG/5ML suspension Take by mouth daily as needed for mild constipation.   Yes Historical Provider, MD  polyvinyl alcohol (LIQUIFILM TEARS) 1.4 % ophthalmic solution Place 1 drop into both  eyes as needed for dry eyes.   Yes Historical Provider, MD  Guaifenesin (MUCINEX MAXIMUM STRENGTH) 1200 MG TB12 Take 1 tablet (1,200 mg total) by mouth 2 (two) times daily. Patient not taking: Reported on 07/14/2015 02/17/14   Verlee Monte, MD    Family History Family History  Problem Relation Age of Onset  . Cancer Sister     Breast  . Stroke Sister   . Heart disease Mother   . Heart disease      Social History Social History  Substance Use Topics  . Smoking status: Former Smoker    Packs/day: 0.12    Years: 35.00    Types: Cigarettes    Quit date: 01/02/1991  . Smokeless tobacco: Never Used  . Alcohol use No     Allergies   Indomethacin   Review of Systems Review of Systems  All other systems reviewed and are negative.    Physical Exam Updated Vital Signs  BP 134/68 (BP Location: Left Arm)   Pulse 88   Temp 100.4 F (38 C) (Oral)   Resp 25   Ht 5' (1.524 m)   Wt 150 lb (68 kg)   SpO2 98%   BMI 29.29 kg/m   Physical Exam  Constitutional: She is oriented to person, place, and time. She appears well-developed and well-nourished. No distress.  HENT:  Head: Normocephalic and atraumatic.  Eyes: EOM are normal.  Neck: Normal range of motion.  Cardiovascular: Normal rate, regular rhythm and normal heart sounds.   Pulmonary/Chest:  Tachypnea.  Accessory muscle use.  Rhonchi bilaterally  Abdominal: Soft. She exhibits no distension. There is no tenderness.  Musculoskeletal: Normal range of motion.  Neurological: She is alert and oriented to person, place, and time.  Skin: Skin is warm and dry.  Psychiatric: She has a normal mood and affect. Judgment normal.  Nursing note and vitals reviewed.    ED Treatments / Results  Labs (all labs ordered are listed, but only abnormal results are displayed) Labs Reviewed  BASIC METABOLIC PANEL - Abnormal; Notable for the following:       Result Value   Sodium 134 (*)    CO2 21 (*)    Glucose, Bld 129 (*)     Creatinine, Ser 1.03 (*)    Calcium 8.7 (*)    GFR calc non Af Amer 47 (*)    GFR calc Af Amer 55 (*)    All other components within normal limits  CBC - Abnormal; Notable for the following:    WBC 13.3 (*)    RBC 3.16 (*)    Hemoglobin 11.0 (*)    HCT 30.7 (*)    MCH 34.8 (*)    All other components within normal limits  CULTURE, BLOOD (ROUTINE X 2)  CULTURE, BLOOD (ROUTINE X 2)  URINALYSIS, ROUTINE W REFLEX MICROSCOPIC (NOT AT [facility])  I-STAT TROPOININ, ED  I-STAT CG4 LACTIC ACID, ED    EKG  EKG Interpretation  Date/Time:  Tuesday August 23 2015 19:29:36 EDT Ventricular Rate:  101 PR Interval:  148 QRS Duration: 128 QT Interval:  374 QTC Calculation: 484 R Axis:   62 Text Interpretation:  Sinus tachycardia with occasional Premature ventricular complexes Non-specific intra-ventricular conduction block Cannot rule out Anterior infarct , age undetermined T wave abnormality, consider lateral ischemia Abnormal ECG No significant change was found Confirmed by Kamaal Cast  MD, Lennette Bihari ([id number]) on 08/23/2015 9:41:27 PM       Radiology Dg Chest 2 View  Result Date: 08/23/2015 CLINICAL DATA:  Patient with productive cough and fever. EXAM: CHEST  2 VIEW COMPARISON:  Chest radiograph 04/11/2015 FINDINGS: Stable cardiomegaly. Interval development of peripheral consolidation within the left mid lung. No pleural effusion or pneumothorax. Right shoulder joint degenerative changes. Thoracic spine degenerative changes. Multiple calcified thyroid nodules. IMPRESSION: New peripheral consolidation within the left mid lung concerning for pneumonia. Followup PA and lateral chest X-ray is recommended in 3-4 weeks following trial of antibiotic therapy to ensure resolution and exclude underlying malignancy. Electronically Signed   By: Lovey Newcomer M.D.   On: 08/23/2015 21:18    Procedures Procedures (including critical care time)   ++++++++++++++++++++++++++++++++++++++++++++++++++++++  CRITICAL  CARE Performed by: Hoy Morn Total critical care time: 33 minutes Critical care time was exclusive of separately billable procedures and treating other patients. Critical care was necessary to treat or prevent imminent or life-threatening deterioration. Critical care was time spent personally by me on the following activities: development of  treatment plan with patient and/or surrogate as well as nursing, discussions with consultants, evaluation of patient's response to treatment, examination of patient, obtaining history from patient or surrogate, ordering and performing treatments and interventions, ordering and review of laboratory studies, ordering and review of radiographic studies, pulse oximetry and re-evaluation of patient's condition.   +++++++++++++++++++++++++++++++++++++++++++++++++++++++++++++    Medications Ordered in ED Medications  cefTRIAXone (ROCEPHIN) 1 g in dextrose 5 % 50 mL IVPB (1 g Intravenous New Bag/Given 08/23/15 2120)  azithromycin (ZITHROMAX) 500 mg in dextrose 5 % 250 mL IVPB (not administered)  albuterol (PROVENTIL) (2.5 MG/3ML) 0.083% nebulizer solution 5 mg (not administered)  ipratropium (ATROVENT) nebulizer solution 0.5 mg (not administered)  sodium chloride 0.9 % bolus 1,000 mL (1,000 mLs Intravenous New Bag/Given 08/23/15 2120)     Initial Impression / Assessment and Plan / ED Course  I have reviewed the triage vital signs and the nursing notes.  Pertinent labs & imaging results that were available during my care of the patient were reviewed by me and considered in my medical decision making (see chart for details).  Clinical Course  Patient be admitted for community acquired pneumonia.  Infiltrate on chest x-ray.  Clinical picture consistent with this.  Patient be started on Rocephin and azithromycin for treatment acquired pneumonia.  9:49 PM Patient continues to be tachypnea can the 40s.  She still speaking in short phrases.  She'll be started  on albuterol and Atrovent at this time.  I think she'll benefit from BiPAP.  I had a prolonged discussion with her and her family.  She would like a short trial on the ventilator if her breathing were to worsen.  If she has sudden cardiac death, asystole, abnormal heart rhythm that needs defibrillation she would not want this.  It sounds as though this is a limited resuscitation.  This information will be passed on to the admitting team  Final Clinical Impressions(s) / ED Diagnoses   Final diagnoses:  CAP (community acquired pneumonia)    New Prescriptions New Prescriptions   No medications on file     Jola Schmidt, MD 08/23/15 2142    Jola Schmidt, MD 08/23/15 2150

## 2015-08-23 NOTE — H&P (Signed)
History and Physical    [name] [id number] DOB: [birth year] DOA: 08/23/2015  Referring MD/NP/PA:   PCP: Leonard Downing, MD   Patient coming from:  The patient is coming from home.  At baseline, pt is partially dependent for her ADL.  Chief Complaint: Fever, cough, shortness of breath, chest pain  HPI: [name] is a 82 y.o. female with medical history significant of hypertension, hyperlipidemia, COPD on 2 L oxygen at home, GERD, hypothyroidism, stroke, dCHF, anemia, diverticulosis of colon with perforation, who presents with fever, cough, shortness of breath and chest pain.  Patient reports that she has been having fever, chills cough, shortness of breath and chest pain for about 3 days. Her chest pain is located in the front chest, constant, moderate, nonradiating. It is sharp, pleuritic. It is aggravated by coughing. Patient coughs up yellow colored sputum. She has runny nose, no sore throat. Patient denies nausea, vomiting, diarrhea, abdominal pain, symptoms of UTI or new unilateral weakness. Patient was seen by PCP yesterday, and given a prescription of antibiotics (azithromycin) without significant help.  ED Course: pt was found to have WBC 13.3, lactate 1.27, troponin 0.08, pending urinalysis, temperature 100.4, tachycardia, tachypnea, oxygen saturation 91% on room air, electrolytes and renal function okay. ABG with pH 7.417, PCO2 36, PO2 186. Chest x-ray showed infiltration in left middle lobe. Patient is admitted to stepdown as inpatient.  Review of Systems:   General: has fevers, chills, no changes in body weight, has poor appetite, has fatigue HEENT: no blurry vision, hearing changes or sore throat Respiratory: has dyspnea, coughing, no wheezing CV: has chest pain, no palpitations GI: no nausea, vomiting, abdominal pain, diarrhea, constipation GU: no dysuria, burning on urination, increased urinary frequency, hematuria  Ext: has mild leg edema Neuro: no  unilateral weakness, numbness, or tingling, no vision change or hearing loss Skin: no rash MSK: No muscle spasm, no deformity, no limitation of range of movement in spin Heme: No easy bruising.  Travel history: No recent long distant travel.  Allergy:  Allergies  Allergen Reactions  . Indomethacin Anaphylaxis and Other (See Comments)    "took it fine for awhile; one day I stopped breathing and I ended up in the hospital" (01/02/2012) Pt has tolerated aspirin    Past Medical History:  Diagnosis Date  . Anemia    years ago  . Arthritis    "right shoulder" (01/02/2012)  . Bladder mass   . CAP (community acquired pneumonia) 01/02/2012   Lower lobe   . CHF (congestive heart failure) ([place])   . COPD (chronic obstructive pulmonary disease) ([place])   . Difficulty sleeping   . Diverticulitis of colon with perforation 01/14/2012   That is post sigmoid resection with [facility] pouch and end colostomy   . GERD (gastroesophageal reflux disease)    occasional  . Goiter    radioactive iodine ablation/notes 01/02/2012  . History of kidney stones    X 1  . Hypertension   . Hypothyroidism    "I have taken Synthroid before" (01/02/2012)   HAS HAD RADIOACTIVE IODINE TX 2 YR S AGO  . Intra-abdominal abscess ([place]) 01/14/2012  . Pneumatosis of intestines 01/14/2012  . Pneumonia 01/02/2012; ? 2009  . Radiation 08/10/13-09/15/13   57 gray to vaginal/bladder mass  . SOB (shortness of breath)    'all the time" (01/02/2012)  . Stroke ([place])   . UTI (lower urinary tract infection) 01/03/2012    Past Surgical History:  Procedure Laterality Date  . ABDOMINAL  HYSTERECTOMY  1980's  . APPENDECTOMY  1980's   "when they did hysterectomy" (01/02/2012)  . CATARACT EXTRACTION W/ INTRAOCULAR LENS  IMPLANT, BILATERAL  ?1990's   Bil  . COLON SURGERY    . COLOSTOMY N/A 03/12/2012   Procedure: COLOSTOMY;  Surgeon: Ralene Ok, MD;  Location: [place];  Service: General;  Laterality: N/A;  . COLOSTOMY REVISION N/A 03/12/2012    Procedure: COLON RESECTION SIGMOID ;  Surgeon: Ralene Ok, MD;  Location: [place];  Service: General;  Laterality: N/A;  . COLOSTOMY TAKEDOWN N/A 10/21/2012   Procedure: LAPAROSCOPIC COLOSTOMY TAKEDOWN AND HARTMANS ANASTOMOSIS, rigid proctoscopy;  Surgeon: Ralene Ok, MD;  Location: [facility];  Service: General;  Laterality: N/A;  . EYE SURGERY    . LYSIS OF ADHESION N/A 10/21/2012   Procedure: LYSIS OF ADHESION;  Surgeon: Ralene Ok, MD;  Location: [facility];  Service: General;  Laterality: N/A;  . TRANSURETHRAL RESECTION OF BLADDER TUMOR WITH GYRUS (TURBT-GYRUS) N/A 06/30/2013   Procedure: TRANSURETHRAL RESECTION OF BLADDER TUMOR WITH GYRUS (TURBT-GYRUS)/VAGINAL BIOPSY;  Surgeon: Ardis Hughs, MD;  Location: [facility];  Service: Urology;  Laterality: N/A;    Social History:  reports that she quit smoking about 24 years ago. Her smoking use included Cigarettes. She has a 4.20 pack-year smoking history. She has never used smokeless tobacco. She reports that she does not drink alcohol or use drugs.  Family History:  Family History  Problem Relation Age of Onset  . Cancer Sister     Breast  . Stroke Sister   . Heart disease Mother   . Heart disease       Prior to Admission medications   Medication Sig Start Date End Date Taking? Authorizing Provider  acetaminophen (TYLENOL) 500 MG tablet Take 1,000 mg by mouth every 6 (six) hours as needed for mild pain. Reported on 07/14/2015   Yes Historical Provider, MD  albuterol (PROVENTIL HFA;VENTOLIN HFA) 108 (90 BASE) MCG/ACT inhaler Inhale 2 puffs into the lungs every 6 (six) hours as needed for wheezing.   Yes Historical Provider, MD  aspirin 325 MG tablet Take 650 mg by mouth every 6 (six) hours as needed for mild pain.   Yes Historical Provider, MD  atorvastatin (LIPITOR) 40 MG tablet Take 1 tablet (40 mg total) by mouth daily at 6 PM. 05/27/15  Yes Thayer Headings, MD  azithromycin (ZITHROMAX) 250 MG tablet Take 250 mg by mouth daily.    Yes Historical Provider, MD  Budesonide-Formoterol Fumarate (SYMBICORT IN) Inhale 2 puffs into the lungs 2 (two) times daily.   Yes Historical Provider, MD  clopidogrel (PLAVIX) 75 MG tablet Take 1 tablet (75 mg total) by mouth daily. 05/27/15  Yes Thayer Headings, MD  furosemide (LASIX) 20 MG tablet Take 1 tablet (20 mg total) by mouth daily. 05/27/15  Yes Thayer Headings, MD  lisinopril (PRINIVIL,ZESTRIL) 20 MG tablet Take 1 tablet (20 mg total) by mouth daily. 05/27/15  Yes Thayer Headings, MD  magnesium hydroxide (MILK OF MAGNESIA) 400 MG/5ML suspension Take by mouth daily as needed for mild constipation.   Yes Historical Provider, MD  polyvinyl alcohol (LIQUIFILM TEARS) 1.4 % ophthalmic solution Place 1 drop into both eyes as needed for dry eyes.   Yes Historical Provider, MD    Physical Exam: Vitals:   08/23/15 2132 08/23/15 2145 08/23/15 2211 08/23/15 2301  BP: 134/68 153/69  144/70  Pulse: 88   87  Resp: 25 (!) 38  (!) 30  Temp:  TempSrc:      SpO2: 98%  100% 99%  Weight:      Height:       General: Not in acute distress HEENT:       Eyes: PERRL, EOMI, no scleral icterus.       ENT: No discharge from the ears and nose, no pharynx injection, no tonsillar enlargement.        Neck: No JVD, no bruit, no mass felt. Heme: No neck lymph node enlargement. Cardiac: S1/S2, RRR, No murmurs, No gallops or rubs. Respiratory:  No rales, wheezing, rhonchi or rubs. GI: Soft, nondistended, nontender, no rebound pain, no organomegaly, BS present. GU: No hematuria Ext: trace leg edema bilaterally. 2+DP/PT pulse bilaterally. Musculoskeletal: No joint deformities, No joint redness or warmth, no limitation of ROM in spin. Skin: No rashes.  Neuro: Alert, oriented X3, cranial nerves II-XII grossly intact, moves all extremities normally. Psych: Patient is not psychotic, no suicidal or hemocidal ideation.  Labs on Admission: I have personally reviewed following labs and imaging  studies  CBC:  Recent Labs Lab 08/23/15 1940  WBC 13.3*  HGB 11.0*  HCT 30.7*  MCV 97.2  PLT 99991111   Basic Metabolic Panel:  Recent Labs Lab 08/23/15 1940  NA 134*  K 3.9  CL 104  CO2 21*  GLUCOSE 129*  BUN 20  CREATININE 1.03*  CALCIUM 8.7*   GFR: Estimated Creatinine Clearance: 32.5 mL/min (by C-G formula based on SCr of 1.03 mg/dL). Liver Function Tests: No results for input(s): AST, ALT, ALKPHOS, BILITOT, PROT, ALBUMIN in the last 168 hours. No results for input(s): LIPASE, AMYLASE in the last 168 hours. No results for input(s): AMMONIA in the last 168 hours. Coagulation Profile:  Recent Labs Lab 08/23/15 2234  INR 1.53   Cardiac Enzymes:  Recent Labs Lab 08/23/15 2234  TROPONINI 0.08*   BNP (last 3 results) No results for input(s): PROBNP in the last 8760 hours. HbA1C: No results for input(s): HGBA1C in the last 72 hours. CBG: No results for input(s): GLUCAP in the last 168 hours. Lipid Profile: No results for input(s): CHOL, HDL, LDLCALC, TRIG, CHOLHDL, LDLDIRECT in the last 72 hours. Thyroid Function Tests: No results for input(s): TSH, T4TOTAL, FREET4, T3FREE, THYROIDAB in the last 72 hours. Anemia Panel: No results for input(s): VITAMINB12, FOLATE, FERRITIN, TIBC, IRON, RETICCTPCT in the last 72 hours. Urine analysis:    Component Value Date/Time   COLORURINE YELLOW 04/11/2015 1839   APPEARANCEUR CLEAR 04/11/2015 1839   LABSPEC 1.025 04/11/2015 1839   LABSPEC 1.010 10/29/2013 1105   PHURINE 6.0 04/11/2015 1839   GLUCOSEU NEGATIVE 04/11/2015 1839   GLUCOSEU Negative 10/29/2013 1105   HGBUR NEGATIVE 04/11/2015 1839   BILIRUBINUR NEGATIVE 04/11/2015 1839   BILIRUBINUR Negative 10/29/2013 1105   KETONESUR NEGATIVE 04/11/2015 1839   PROTEINUR 30 (A) 04/11/2015 1839   UROBILINOGEN 1.0 02/13/2014 1137   UROBILINOGEN 0.2 10/29/2013 1105   NITRITE NEGATIVE 04/11/2015 1839   LEUKOCYTESUR SMALL (A) 04/11/2015 1839   LEUKOCYTESUR Trace  10/29/2013 1105   Sepsis Labs: @LABRCNTIP (procalcitonin:4,lacticidven:4) )No results found for this or any previous visit (from the past 240 hour(s)).   Radiological Exams on Admission: Dg Chest 2 View  Result Date: 08/23/2015 CLINICAL DATA:  Patient with productive cough and fever. EXAM: CHEST  2 VIEW COMPARISON:  Chest radiograph 04/11/2015 FINDINGS: Stable cardiomegaly. Interval development of peripheral consolidation within the left mid lung. No pleural effusion or pneumothorax. Right shoulder joint degenerative changes. Thoracic spine degenerative changes. Multiple calcified  thyroid nodules. IMPRESSION: New peripheral consolidation within the left mid lung concerning for pneumonia. Followup PA and lateral chest X-ray is recommended in 3-4 weeks following trial of antibiotic therapy to ensure resolution and exclude underlying malignancy. Electronically Signed   By: Lovey Newcomer M.D.   On: 08/23/2015 21:18     EKG: Independently reviewed. Sinus rhythm, QTC 484, LAE, widening QRS wave, T-wave inversion in V5-V6, ST depression in V5-V6  Assessment/Plan Principal Problem:   CAP (community acquired pneumonia) Active Problems:   COPD (chronic obstructive pulmonary disease) (HCC)   Sepsis due to undetermined organism with acute respiratory failure (HCC)   Elevated troponin   Cerebral thrombosis with cerebral infarction   HLD (hyperlipidemia)   Essential hypertension   Chronic diastolic (congestive) heart failure (HCC)   CAP and sepsis: Patient's a productive cough, shortness of breath, fever, plus findings of infiltration left middle lobe, consistent with CAP. Pt meets criteria for sepsis with leukocytosis, fever, tachycardia, tachypnea. Lactate is normal, currently hemodynamically stable.  - Will admit to SDU as inpt (due to the need of BiPAP) - continue BiPAP - check ABG - IV Rocephin and azithromycin - Mucinex for cough  - prn Albuterol Nebs for SOB - Urine legionella and S.  pneumococcal antigen - Follow up blood culture x2, sputum culture and respiratory virus panel - will get Procalcitonin and trend lactic acid level per sepsis protocol - IVF: 1L of NS bolus in ED, followed by 100 mL per hour of NS (patient has diastolic congestive heart failure, limiting aggressive IV fluids treatment)  COPD: pt does not have wheezing or rhonchi on auscultation. Clinically pt does not seem to have COPD exacerbation. -When necessary albuterol nebulizers -Mucinex of cough  Elevated troponin: Patient seems to have chronically elevated troponin. Her troponin was 0.07-0.12 in February 2016. Today her troponin is 0.08. Pt has CP, which is pleuritic in nature, likely due to pneumonia. EKG showed T-wave inversion and ST depression in V5-V6. May be due to demanding ischemia 2/2 CAP and sepsis. - cycle CE q6 x3 and repeat her EKG in the am  - Aspirin, lipitor  - prn percocet for CP - Risk factor stratification: will check FLP and A1C  - 2d echo  Hx of stroke: Cerebral thrombosis with cerebral infarction -continue ASA, plavix and lipitor  HLD: Last LDL was 110 on 04/11/15 -Continue home medications: Lipitor -Check FLP  Essential hypertension: Blood pressure 134/68. -Continue lisinopril  Chronic diastolic (congestive) heart failure ([place]): 2-D echo on 04/12/15 showed EF 55-60% with grade 1 diastolic dysfunction. Patient is on low-dose Lasix, 20 mg daily at home. She has trace amount of leg edema, but no JVD. CHF seems to be compensated on admission. -Hold Lasix due to sepsis -Check BNP -Continue aspirin  DVT ppx:  SQ Lovenox Code Status: Partial code (OK with intubation, but not CPR) Family Communication: Yes, patient's son at bed side Disposition Plan:  Anticipate discharge back to previous home environment Consults called:  none Admission status: SDU/inpation       Date of Service 08/23/2015    Ivor Costa [facility] Pager [phone]  If 7PM-7AM, please contact  night-coverage [email or web address] Password [facility] 08/23/2015, 11:45 PM

## 2015-08-23 NOTE — ED Notes (Signed)
Respiratory called

## 2015-08-23 NOTE — ED Notes (Signed)
This RN attempted IV x 2 without success.  Will seek additional assistance.

## 2015-08-23 NOTE — ED Triage Notes (Signed)
Patient arrives with productive cough, fever, and SOB x3 days. States was seen by PCP and has been started on Abx. Since that time patient has felt worse. Obviously increased work of breathing in triage. Patient still able to speak in complete sentences. Rhonchi noted on chest assessment.

## 2015-08-23 NOTE — ED Notes (Signed)
2nd RN attempted IV x 2 without success.  Will follow up with MD and IV team

## 2015-08-24 ENCOUNTER — Other Ambulatory Visit (HOSPITAL_COMMUNITY): Payer: Medicare Other

## 2015-08-24 DIAGNOSIS — I5032 Chronic diastolic (congestive) heart failure: Secondary | ICD-10-CM

## 2015-08-24 DIAGNOSIS — A419 Sepsis, unspecified organism: Principal | ICD-10-CM

## 2015-08-24 DIAGNOSIS — J189 Pneumonia, unspecified organism: Secondary | ICD-10-CM

## 2015-08-24 DIAGNOSIS — R6520 Severe sepsis without septic shock: Secondary | ICD-10-CM

## 2015-08-24 DIAGNOSIS — J438 Other emphysema: Secondary | ICD-10-CM

## 2015-08-24 DIAGNOSIS — J9600 Acute respiratory failure, unspecified whether with hypoxia or hypercapnia: Secondary | ICD-10-CM

## 2015-08-24 LAB — RESPIRATORY PANEL BY PCR
Adenovirus: NOT DETECTED
BORDETELLA PERTUSSIS-RVPCR: NOT DETECTED
CORONAVIRUS HKU1-RVPPCR: NOT DETECTED
Chlamydophila pneumoniae: NOT DETECTED
Coronavirus 229E: NOT DETECTED
Coronavirus NL63: NOT DETECTED
Coronavirus OC43: NOT DETECTED
Influenza A: NOT DETECTED
Influenza B: NOT DETECTED
METAPNEUMOVIRUS-RVPPCR: NOT DETECTED
Mycoplasma pneumoniae: NOT DETECTED
PARAINFLUENZA VIRUS 2-RVPPCR: NOT DETECTED
PARAINFLUENZA VIRUS 3-RVPPCR: NOT DETECTED
Parainfluenza Virus 1: NOT DETECTED
Parainfluenza Virus 4: NOT DETECTED
RHINOVIRUS / ENTEROVIRUS - RVPPCR: NOT DETECTED
Respiratory Syncytial Virus: NOT DETECTED

## 2015-08-24 LAB — URINALYSIS, ROUTINE W REFLEX MICROSCOPIC
Glucose, UA: NEGATIVE mg/dL
KETONES UR: 15 mg/dL — AB
NITRITE: NEGATIVE
Protein, ur: 100 mg/dL — AB
Specific Gravity, Urine: 1.027 (ref 1.005–1.030)
pH: 5.5 (ref 5.0–8.0)

## 2015-08-24 LAB — CBC
HEMATOCRIT: 28.3 % — AB (ref 36.0–46.0)
Hemoglobin: 9.3 g/dL — ABNORMAL LOW (ref 12.0–15.0)
MCH: 31.7 pg (ref 26.0–34.0)
MCHC: 32.9 g/dL (ref 30.0–36.0)
MCV: 96.6 fL (ref 78.0–100.0)
Platelets: 282 10*3/uL (ref 150–400)
RBC: 2.93 MIL/uL — ABNORMAL LOW (ref 3.87–5.11)
RDW: 13 % (ref 11.5–15.5)
WBC: 13.6 10*3/uL — ABNORMAL HIGH (ref 4.0–10.5)

## 2015-08-24 LAB — LIPID PANEL
CHOL/HDL RATIO: 2.5 ratio
Cholesterol: 96 mg/dL (ref 0–200)
HDL: 38 mg/dL — AB (ref >40)
LDL Cholesterol: 48 mg/dL (ref 0–99)
Triglycerides: 50 mg/dL (ref <150)
VLDL: 10 mg/dL (ref 0–40)

## 2015-08-24 LAB — BASIC METABOLIC PANEL
Anion gap: 7 (ref 5–15)
BUN: 19 mg/dL (ref 6–20)
CHLORIDE: 104 mmol/L (ref 101–111)
CO2: 25 mmol/L (ref 22–32)
CREATININE: 0.89 mg/dL (ref 0.44–1.00)
Calcium: 8.3 mg/dL — ABNORMAL LOW (ref 8.9–10.3)
GFR calc Af Amer: >60 mL/min (ref >60)
GFR calc non Af Amer: 56 mL/min — ABNORMAL LOW (ref >60)
Glucose, Bld: 157 mg/dL — ABNORMAL HIGH (ref 65–99)
Potassium: 3.6 mmol/L (ref 3.5–5.1)
SODIUM: 136 mmol/L (ref 135–145)

## 2015-08-24 LAB — MAGNESIUM: MAGNESIUM: 2.1 mg/dL (ref 1.7–2.4)

## 2015-08-24 LAB — URINE MICROSCOPIC-ADD ON

## 2015-08-24 LAB — HIV ANTIBODY (ROUTINE TESTING W REFLEX): HIV SCREEN 4TH GENERATION: NONREACTIVE

## 2015-08-24 LAB — TROPONIN I
TROPONIN I: 0.07 ng/mL — AB (ref <0.03)
Troponin I: 0.09 ng/mL (ref <0.03)

## 2015-08-24 LAB — BRAIN NATRIURETIC PEPTIDE: B Natriuretic Peptide: 479.9 pg/mL — ABNORMAL HIGH (ref 0.0–100.0)

## 2015-08-24 LAB — LACTIC ACID, PLASMA: Lactic Acid, Venous: 1.1 mmol/L (ref 0.5–1.9)

## 2015-08-24 LAB — GLUCOSE, CAPILLARY: GLUCOSE-CAPILLARY: 129 mg/dL — AB (ref 65–99)

## 2015-08-24 LAB — STREP PNEUMONIAE URINARY ANTIGEN: STREP PNEUMO URINARY ANTIGEN: NEGATIVE

## 2015-08-24 LAB — MRSA PCR SCREENING: MRSA BY PCR: NEGATIVE

## 2015-08-24 MED ORDER — FUROSEMIDE 20 MG PO TABS
20.0000 mg | ORAL_TABLET | Freq: Every day | ORAL | Status: DC
  Administered 2015-08-24 – 2015-08-26 (×3): 20 mg via ORAL
  Filled 2015-08-24 (×3): qty 1

## 2015-08-24 MED ORDER — GUAIFENESIN ER 600 MG PO TB12
600.0000 mg | ORAL_TABLET | Freq: Two times a day (BID) | ORAL | Status: DC
  Administered 2015-08-24: 600 mg via ORAL
  Filled 2015-08-24 (×2): qty 1

## 2015-08-24 MED ORDER — ALBUTEROL SULFATE (2.5 MG/3ML) 0.083% IN NEBU: 2.5000 mg | INHALATION_SOLUTION | RESPIRATORY_TRACT | Status: DC | PRN

## 2015-08-24 MED ORDER — IPRATROPIUM-ALBUTEROL 0.5-2.5 (3) MG/3ML IN SOLN
3.0000 mL | Freq: Three times a day (TID) | RESPIRATORY_TRACT | Status: DC
  Administered 2015-08-24 – 2015-08-28 (×11): 3 mL via RESPIRATORY_TRACT
  Filled 2015-08-24 (×11): qty 3

## 2015-08-24 NOTE — Progress Notes (Signed)
Patient complains of pain around bridge of nose from mask.  Taken off Bipap and placed on nasal canula.  Work of breathing still noted to be mildly increased, but improved from pre-NIV.  Patient admits to improvement in breathing status.  Will leave off Bipap at this time and continue to monitor.

## 2015-08-24 NOTE — Progress Notes (Signed)
PROGRESS NOTE        PATIENT DETAILS Name: [name] Age: 82 y.o. Sex: female Date of Birth: 04-22-1925 Admit Date: 08/23/2015 Admitting Physician Ivor Costa, MD [id number] Danne Baxter, MD  Brief Narrative: Patient is a 82 y.o. female with past medical history of COPD on 2 L of oxygen at home, chronic diastolic heart failure presented to the hospital with fever, cough, and shortness of breath. Chest x-ray showed left-sided pneumonia. She briefly required BiPAP in the emergency room, she was subsequently given IV antibiotics, bronchodilators and admitted to the hospitalist service.  Subjective: Feeling much better-no longer requiring BiPAP-on nasal cannula this morning. Looks weak and chronically sick appearing but not in any distress.  Assessment/Plan: Principal Problem: Acute on chronic hypoxemic respiratory failure: Acute respiratory failure likely secondary to community acquired pneumonia. Initially required BiPAP in the emergency room, much improved and back on nasal cannula. Slowly titrate down oxygen to usual home regimen of 2 L/m.  Active Problems: Sepsis secondary to community acquired pneumonia: Sepsis pathophysiology has resolved, continue Rocephin and Zithromax. Urine streptococcal antigen negative, Legionella antigen is pending. Await culture data.  Minimally elevated troponins: EKG negative, troponin trend is flat and not consistent with ACS. Check echo, continue Plavix and follow. Furthermore she does not have any chest pain, shortness of breath is markedly improved this morning.  Anemia: No evidence of blood loss, suspect has slight decrease in hemoglobin than usual baseline due to acute illness. Follow  COPD: Lungs are clear-evidence of exacerbation this morning. Continue bronchodilators.  Chronic diastolic heart failure: Appears compensated, shortness of breath likely secondary to pneumonia/COPD. Resume Lasix, and follow daily  weights.  History of recent CVA: Nonfocal exam, continue Plavix and statin.  Hypertension: Controlled, continue lisinopril  Dyslipidemia: Continue statins.  DVT Prophylaxis: Prophylactic Lovenox   Code Status: Partial code  Family Communication:  None at bedside-patient is awake and alert and understanding of the above noted plan. Will recheck out to family over the next few days  Disposition Plan: Remain inpatient-transfer to telemetry-we'll plan on home and services on discharge.   Antimicrobial agents: IV Rocephin 8/22>> IV Zithromax 8/22>>  Procedures: None  CONSULTS:  None  Time spent: 25  minutes-Greater than 50% of this time was spent in counseling, explanation of diagnosis, planning of further management, and coordination of care.  MEDICATIONS: Anti-infectives    Start     Dose/Rate Route Frequency Ordered Stop   08/24/15 2200  cefTRIAXone (ROCEPHIN) 1 g in dextrose 5 % 50 mL IVPB     1 g 100 mL/hr over 30 Minutes Intravenous Every 24 hours 08/23/15 2219 08/30/15 2159   08/24/15 2200  azithromycin (ZITHROMAX) 500 mg in dextrose 5 % 250 mL IVPB     500 mg 250 mL/hr over 60 Minutes Intravenous Every 24 hours 08/23/15 2219 08/30/15 2159   08/23/15 2045  cefTRIAXone (ROCEPHIN) 1 g in dextrose 5 % 50 mL IVPB     1 g 100 mL/hr over 30 Minutes Intravenous  Once 08/23/15 2044 08/23/15 2217   08/23/15 2045  azithromycin (ZITHROMAX) 500 mg in dextrose 5 % 250 mL IVPB     500 mg 250 mL/hr over 60 Minutes Intravenous  Once 08/23/15 2044 08/23/15 2330      Scheduled Meds: . aspirin  325 mg Oral Daily  . atorvastatin  40 mg Oral q1800  .  azithromycin  500 mg Intravenous Q24H  . budesonide-formoterol  2 puff Inhalation BID  . cefTRIAXone (ROCEPHIN)  IV  1 g Intravenous Q24H  . clopidogrel  75 mg Oral Daily  . dextromethorphan-guaiFENesin  1 tablet Oral BID  . enoxaparin (LOVENOX) injection  40 mg Subcutaneous Daily  . lisinopril  20 mg Oral Daily   Continuous  Infusions:  PRN Meds:.acetaminophen, albuterol, magnesium hydroxide, nitroGLYCERIN, oxyCODONE-acetaminophen, polyvinyl alcohol   PHYSICAL EXAM: Vital signs: Vitals:   08/24/15 0718 08/24/15 0800 08/24/15 0900 08/24/15 1143  BP:  130/81    Pulse:  72    Resp:  (!) 22    Temp: 99.5 F (37.5 C)   98.8 F (37.1 C)  TempSrc: Oral   Oral  SpO2:  98% 97%   Weight:      Height:       Filed Weights   08/23/15 1926 08/24/15 0300  Weight: 68 kg (150 lb) 68.9 kg (151 lb 14.4 oz)   Body mass index is 28.7 kg/m.   Gen Exam: Awake and alert with clear speech. Not in any distress  Neck: Supple, No JVD.   Chest: Moving air well-has few bibasilar rales CVS: S1 S2 Regular, no murmurs.  Abdomen: soft, BS +, non tender, non distended.  Extremities: no edema, lower extremities warm to touch. Neurologic: Non Focal.   Skin: No Rash or lesions   Wounds: N/A.   I have personally reviewed following labs and imaging studies  LABORATORY DATA: CBC:  Recent Labs Lab 08/23/15 1940 08/24/15 1021  WBC 13.3* 13.6*  HGB 11.0* 9.3*  HCT 30.7* 28.3*  MCV 97.2 96.6  PLT 302 [id number]    Basic Metabolic Panel:  Recent Labs Lab 08/23/15 1940 08/24/15 1021  NA 134* 136  K 3.9 3.6  CL 104 104  CO2 21* 25  GLUCOSE 129* 157*  BUN 20 19  CREATININE 1.03* 0.89  CALCIUM 8.7* 8.3*  MG  --  2.1    GFR: Estimated Creatinine Clearance: 38.8 mL/min (by C-G formula based on SCr of 0.89 mg/dL).  Liver Function Tests: No results for input(s): AST, ALT, ALKPHOS, BILITOT, PROT, ALBUMIN in the last 168 hours. No results for input(s): LIPASE, AMYLASE in the last 168 hours. No results for input(s): AMMONIA in the last 168 hours.  Coagulation Profile:  Recent Labs Lab 08/23/15 2234  INR 1.53    Cardiac Enzymes:  Recent Labs Lab 08/23/15 2234 08/24/15 0247 08/24/15 1021  TROPONINI 0.08* 0.09* 0.07*    BNP (last 3 results) No results for input(s): PROBNP in the last 8760  hours.  HbA1C: No results for input(s): HGBA1C in the last 72 hours.  CBG:  Recent Labs Lab 08/24/15 0433  GLUCAP 129*    Lipid Profile:  Recent Labs  08/24/15 0247  CHOL 96  HDL 38*  LDLCALC 48  TRIG 50  CHOLHDL 2.5    Thyroid Function Tests: No results for input(s): TSH, T4TOTAL, FREET4, T3FREE, THYROIDAB in the last 72 hours.  Anemia Panel: No results for input(s): VITAMINB12, FOLATE, FERRITIN, TIBC, IRON, RETICCTPCT in the last 72 hours.  Urine analysis:    Component Value Date/Time   COLORURINE AMBER (A) 08/24/2015 0450   APPEARANCEUR CLOUDY (A) 08/24/2015 0450   LABSPEC 1.027 08/24/2015 0450   LABSPEC 1.010 10/29/2013 1105   PHURINE 5.5 08/24/2015 0450   GLUCOSEU NEGATIVE 08/24/2015 0450   GLUCOSEU Negative 10/29/2013 1105   HGBUR MODERATE (A) 08/24/2015 0450   BILIRUBINUR SMALL (A) 08/24/2015 0450  BILIRUBINUR Negative 10/29/2013 1105   KETONESUR 15 (A) 08/24/2015 0450   PROTEINUR 100 (A) 08/24/2015 0450   UROBILINOGEN 1.0 02/13/2014 1137   UROBILINOGEN 0.2 10/29/2013 1105   NITRITE NEGATIVE 08/24/2015 0450   LEUKOCYTESUR MODERATE (A) 08/24/2015 0450   LEUKOCYTESUR Trace 10/29/2013 1105    Sepsis Labs: Lactic Acid, Venous    Component Value Date/Time   LATICACIDVEN 1.1 08/24/2015 0247    MICROBIOLOGY: Recent Results (from the past 240 hour(s))  MRSA PCR Screening     Status: None   Collection Time: 08/24/15  2:27 AM  Result Value Ref Range Status   MRSA by PCR NEGATIVE NEGATIVE Final    Comment:        The GeneXpert MRSA Assay (FDA approved for NASAL specimens only), is one component of a comprehensive MRSA colonization surveillance program. It is not intended to diagnose MRSA infection nor to guide or monitor treatment for MRSA infections.   Respiratory Panel by PCR     Status: None   Collection Time: 08/24/15  3:47 AM  Result Value Ref Range Status   Adenovirus NOT DETECTED NOT DETECTED Final   Coronavirus 229E NOT DETECTED  NOT DETECTED Final   Coronavirus HKU1 NOT DETECTED NOT DETECTED Final   Coronavirus NL63 NOT DETECTED NOT DETECTED Final   Coronavirus OC43 NOT DETECTED NOT DETECTED Final   Metapneumovirus NOT DETECTED NOT DETECTED Final   Rhinovirus / Enterovirus NOT DETECTED NOT DETECTED Final   Influenza A NOT DETECTED NOT DETECTED Final   Influenza B NOT DETECTED NOT DETECTED Final   Parainfluenza Virus 1 NOT DETECTED NOT DETECTED Final   Parainfluenza Virus 2 NOT DETECTED NOT DETECTED Final   Parainfluenza Virus 3 NOT DETECTED NOT DETECTED Final   Parainfluenza Virus 4 NOT DETECTED NOT DETECTED Final   Respiratory Syncytial Virus NOT DETECTED NOT DETECTED Final   Bordetella pertussis NOT DETECTED NOT DETECTED Final   Chlamydophila pneumoniae NOT DETECTED NOT DETECTED Final   Mycoplasma pneumoniae NOT DETECTED NOT DETECTED Final    RADIOLOGY STUDIES/RESULTS: Dg Chest 2 View  Result Date: 08/23/2015 CLINICAL DATA:  Patient with productive cough and fever. EXAM: CHEST  2 VIEW COMPARISON:  Chest radiograph 04/11/2015 FINDINGS: Stable cardiomegaly. Interval development of peripheral consolidation within the left mid lung. No pleural effusion or pneumothorax. Right shoulder joint degenerative changes. Thoracic spine degenerative changes. Multiple calcified thyroid nodules. IMPRESSION: New peripheral consolidation within the left mid lung concerning for pneumonia. Followup PA and lateral chest X-ray is recommended in 3-4 weeks following trial of antibiotic therapy to ensure resolution and exclude underlying malignancy. Electronically Signed   By: Lovey Newcomer M.D.   On: 08/23/2015 21:18     LOS: 1 day   Oren Binet, MD  [facility] Pager:[phone]  If 7PM-7AM, please contact night-coverage [email or web address] Password [facility] 08/24/2015, 12:07 PM

## 2015-08-24 NOTE — Evaluation (Signed)
Physical Therapy Evaluation Patient Details Name: [name] MRN: [id number] DOB: [birth year] Today's Date: 08/24/2015   History of Present Illness  Patient is a 82 y/o female with hx of HTN, CHF, CVA, COPD, abdominal abscess, colostomy and takedown presents with Fever, cough, shortness of breath, chest pain. Chest x-ray showed infiltration in left middle lobe. Admitted for CAP and sepsis.  Clinical Impression  Patient presents with deconditioning, generalized weakness, dyspnea on exertion and impaired activity tolerance s/p above. Tolerated transfers and gait training with min A for balance/safety. Pt fatigues quickly and exhibits drop in Sp02 with mobility that resolved quickly with cues for pursed lip breathing. Pt has supportive family at home and motivated to return to [facility]. Will follow acutely to maximize independence and mobility prior to return home.    Follow Up Recommendations Home health PT;Supervision for mobility/OOB    Equipment Recommendations  None recommended by PT    Recommendations for Other Services OT consult     Precautions / Restrictions Precautions Precautions: Fall Precaution Comments: watch 02 Restrictions Weight Bearing Restrictions: No      Mobility  Bed Mobility Overal bed mobility: Needs Assistance Bed Mobility: Supine to Sit     Supine to sit: Min assist;HOB elevated     General bed mobility comments: Assist to elevate trunk to get to EOB. + dizziness.  Transfers Overall transfer level: Needs assistance Equipment used: Rolling walker (2 wheeled) Transfers: Sit to/from Stand Sit to Stand: Min guard         General transfer comment: Min guard for safety. Transferred chair post ambulation bout.   Ambulation/Gait Ambulation/Gait assistance: Min guard Ambulation Distance (Feet): 100 Feet Assistive device: Rolling walker (2 wheeled) Gait Pattern/deviations: Step-through pattern;Decreased stride length;Trunk flexed Gait velocity:  decreased Gait velocity interpretation: <1.8 ft/sec, indicative of risk for recurrent falls General Gait Details: Slow, mildly unsteady gait. 2/4 DOE. Sp02 dropped to 88% on 2L/min 02. Able to resolve quickly with cues for breathing. HR stable.   Stairs            Wheelchair Mobility    Modified Rankin (Stroke Patients Only)       Balance Overall balance assessment: Needs assistance Sitting-balance support: Feet supported;No upper extremity supported Sitting balance-Leahy Scale: Good     Standing balance support: During functional activity Standing balance-Leahy Scale: Poor Standing balance comment: Reliant in [place] for support.                              Pertinent Vitals/Pain Pain Assessment: No/denies pain    Home Living Family/patient expects to be discharged to:: Private residence Living Arrangements: Alone Available Help at Discharge: Family;Available PRN/intermittently Type of Home: House Home Access: Stairs to enter Entrance Stairs-Rails: None Entrance Stairs-Number of Steps: 2 Home Layout: One level Home Equipment: Grab bars - toilet;Tub bench;Walker - 4 wheels;Bedside commode;Grab bars - tub/shower;Hand held shower head;Cane - single point;Walker - 2 wheels;Shower seat      Prior Function Level of Independence: Needs assistance   Gait / Transfers Assistance Needed: Uses RW for mobility.  ADL's / Homemaking Assistance Needed: family assists with groceries and laundry and pt gets assist with cooking and cleaning; does not drive  Comments: Reports getting HHPT after stroke. Wears 02 daily 2L.min 02.     Hand Dominance   Dominant Hand: Right    Extremity/Trunk Assessment   Upper Extremity Assessment: Defer to OT evaluation  Lower Extremity Assessment: Generalized weakness         Communication   Communication: No difficulties  Cognition Arousal/Alertness: Awake/alert Behavior During Therapy: WFL for tasks  assessed/performed Overall Cognitive Status: Within Functional Limits for tasks assessed                      General Comments General comments (skin integrity, edema, etc.): BP supine 121/53, BP post ambulation 137/57.    Exercises        Assessment/Plan    PT Assessment Patient needs continued PT services  PT Diagnosis Difficulty walking;Generalized weakness   PT Problem List Decreased strength;Decreased mobility;Decreased balance;Cardiopulmonary status limiting activity;Decreased activity tolerance  PT Treatment Interventions DME instruction;Therapeutic activities;Gait training;Therapeutic exercise;Patient/family education;Stair training;Balance training   PT Goals (Current goals can be found in the Care Plan section) Acute Rehab PT Goals Patient Stated Goal: to get better so I can go home PT Goal Formulation: With patient Time For Goal Achievement: 09/07/15 Potential to Achieve Goals: Good    Frequency Min 3X/week   Barriers to discharge Decreased caregiver support lives alone but has good family support    Co-evaluation               End of Session Equipment Utilized During Treatment: Gait belt;Oxygen Activity Tolerance: Patient tolerated treatment well;Treatment limited secondary to medical complications (Comment) (drop in Sp02) Patient left: in chair;with call bell/phone within reach Nurse Communication: Mobility status;Other (comment) (02)         Time: [id number] PT Time Calculation (min) (ACUTE ONLY): 23 min   Charges:   PT Evaluation $PT Eval Moderate Complexity: 1 Procedure PT Treatments $Gait Training: 8-22 mins   PT G Codes:        Rebbie Lauricella A Heer Justiss 08/24/2015, 11:23 AM Wray Kearns, PT, DPT [phone]

## 2015-08-25 ENCOUNTER — Inpatient Hospital Stay (HOSPITAL_COMMUNITY): Payer: Medicare Other

## 2015-08-25 DIAGNOSIS — R7989 Other specified abnormal findings of blood chemistry: Secondary | ICD-10-CM

## 2015-08-25 DIAGNOSIS — I10 Essential (primary) hypertension: Secondary | ICD-10-CM

## 2015-08-25 LAB — BASIC METABOLIC PANEL
Anion gap: 7 (ref 5–15)
BUN: 21 mg/dL — AB (ref 6–20)
CHLORIDE: 102 mmol/L (ref 101–111)
CO2: 26 mmol/L (ref 22–32)
CREATININE: 0.93 mg/dL (ref 0.44–1.00)
Calcium: 8.5 mg/dL — ABNORMAL LOW (ref 8.9–10.3)
GFR calc Af Amer: >60 mL/min (ref >60)
GFR calc non Af Amer: 53 mL/min — ABNORMAL LOW (ref >60)
Glucose, Bld: 130 mg/dL — ABNORMAL HIGH (ref 65–99)
Potassium: 3.8 mmol/L (ref 3.5–5.1)
SODIUM: 135 mmol/L (ref 135–145)

## 2015-08-25 LAB — ECHOCARDIOGRAM COMPLETE
Height: 61 in
WEIGHTICAEL: 2428.8 [oz_av]

## 2015-08-25 LAB — CBC
HEMATOCRIT: 27.8 % — AB (ref 36.0–46.0)
HEMOGLOBIN: 9.5 g/dL — AB (ref 12.0–15.0)
MCH: 33.5 pg (ref 26.0–34.0)
MCHC: 34.2 g/dL (ref 30.0–36.0)
MCV: 97.9 fL (ref 78.0–100.0)
Platelets: 312 10*3/uL (ref 150–400)
RBC: 2.84 MIL/uL — ABNORMAL LOW (ref 3.87–5.11)
RDW: 13.1 % (ref 11.5–15.5)
WBC: 11.6 10*3/uL — ABNORMAL HIGH (ref 4.0–10.5)

## 2015-08-25 LAB — HEMOGLOBIN A1C
HEMOGLOBIN A1C: 5.2 % (ref 4.8–5.6)
Mean Plasma Glucose: 103 mg/dL

## 2015-08-25 MED ORDER — AZITHROMYCIN 500 MG PO TABS
500.0000 mg | ORAL_TABLET | Freq: Every day | ORAL | Status: DC
  Administered 2015-08-25 – 2015-08-27 (×3): 500 mg via ORAL
  Filled 2015-08-25 (×3): qty 1

## 2015-08-25 MED ORDER — PERFLUTREN LIPID MICROSPHERE
1.0000 mL | INTRAVENOUS | Status: AC | PRN
  Administered 2015-08-25: 2 mL via INTRAVENOUS
  Filled 2015-08-25: qty 10

## 2015-08-25 MED ORDER — PERFLUTREN LIPID MICROSPHERE
INTRAVENOUS | Status: AC
  Filled 2015-08-25: qty 10

## 2015-08-25 NOTE — Progress Notes (Signed)
PROGRESS NOTE        PATIENT DETAILS Name: [name] Age: 82 y.o. Sex: female Date of Birth: 08/05/1925 Admit Date: 08/23/2015 Admitting Physician Ivor Costa, MD [id number] Danne Baxter, MD  Brief Narrative: Patient is a 82 y.o. female with past medical history of COPD on 2 L of oxygen at home, chronic diastolic heart failure presented to the hospital with fever, cough, and shortness of breath. Chest x-ray showed left-sided pneumonia. She briefly required BiPAP in the emergency room, she was subsequently given IV antibiotics, bronchodilators and admitted to the hospitalist service.  Subjective: Continues to improve-feels as if she is now almost back to her baseline.  Assessment/Plan: Principal Problem: Acute on chronic hypoxemic respiratory failure: Acute respiratory failure likely secondary to community acquired pneumonia. Initially required BiPAP in the emergency room, much improved and back on nasal cannula. Slowly titrate down oxygen to usual home regimen of 2 L/m.  Active Problems: Sepsis secondary to community acquired pneumonia: Sepsis pathophysiology has resolved, she is clinically improved- continue Rocephin and Zithromax. Urine streptococcal antigen negative, Legionella antigen is pending. Blood cultures are negative so far. Suspect if clinical improvement continues, she can be transitioned to oral probiotics and discharged home tomorrow.  Minimally elevated troponins: EKG negative, troponin trend is flat and not consistent with ACS. Check echo, continue Plavix and follow. Furthermore she does not have any chest pain, shortness of breath is markedly improved this morning.  Anemia: No evidence of blood loss, suspect has slight decrease in hemoglobin than usual baseline due to acute illness. Follow  COPD: Lungs are clear-evidence of exacerbation this morning. Continue bronchodilators.  Chronic diastolic heart failure: Appears compensated, shortness of  breath likely secondary to pneumonia/COPD. Continue Lasix, and follow daily weights.  History of recent CVA: Nonfocal exam, continue Plavix and statin.  Hypertension: Controlled, continue lisinopril  Dyslipidemia: Continue statins.  DVT Prophylaxis: Prophylactic Lovenox   Code Status: Partial code  Family Communication:  None at bedside-patient is awake and alert and understanding of the above noted plan.   Disposition Plan: Remain inpatient-home possibly tomorrow with home health services  Antimicrobial agents: IV Rocephin 8/22>> IV Zithromax 8/22>>  Procedures: None  CONSULTS:  None  Time spent: 25  minutes-Greater than 50% of this time was spent in counseling, explanation of diagnosis, planning of further management, and coordination of care.  MEDICATIONS: Anti-infectives    Start     Dose/Rate Route Frequency Ordered Stop   08/25/15 1800  azithromycin (ZITHROMAX) tablet 500 mg     500 mg Oral Daily 08/25/15 1026 08/30/15 1759   08/24/15 2200  cefTRIAXone (ROCEPHIN) 1 g in dextrose 5 % 50 mL IVPB     1 g 100 mL/hr over 30 Minutes Intravenous Every 24 hours 08/23/15 2219 08/30/15 2159   08/24/15 2200  azithromycin (ZITHROMAX) 500 mg in dextrose 5 % 250 mL IVPB  Status:  Discontinued     500 mg 250 mL/hr over 60 Minutes Intravenous Every 24 hours 08/23/15 2219 08/25/15 1026   08/23/15 2045  cefTRIAXone (ROCEPHIN) 1 g in dextrose 5 % 50 mL IVPB     1 g 100 mL/hr over 30 Minutes Intravenous  Once 08/23/15 2044 08/23/15 2217   08/23/15 2045  azithromycin (ZITHROMAX) 500 mg in dextrose 5 % 250 mL IVPB     500 mg 250 mL/hr over 60 Minutes Intravenous  Once  08/23/15 2044 08/23/15 2330      Scheduled Meds: . aspirin  325 mg Oral Daily  . atorvastatin  40 mg Oral q1800  . azithromycin  500 mg Oral Daily  . budesonide-formoterol  2 puff Inhalation BID  . cefTRIAXone (ROCEPHIN)  IV  1 g Intravenous Q24H  . clopidogrel  75 mg Oral Daily  .  dextromethorphan-guaiFENesin  1 tablet Oral BID  . enoxaparin (LOVENOX) injection  40 mg Subcutaneous Daily  . furosemide  20 mg Oral Daily  . ipratropium-albuterol  3 mL Nebulization TID  . lisinopril  20 mg Oral Daily   Continuous Infusions:  PRN Meds:.acetaminophen, albuterol, magnesium hydroxide, nitroGLYCERIN, oxyCODONE-acetaminophen, polyvinyl alcohol   PHYSICAL EXAM: Vital signs: Vitals:   08/25/15 0547 08/25/15 0929 08/25/15 0930 08/25/15 1000  BP: 133/64 (!) 121/54 (!) 121/54   Pulse: 64  77   Resp: 18     Temp: 98.2 F (36.8 C)  98.4 F (36.9 C)   TempSrc: Oral  Oral   SpO2: 99%  98% 100%  Weight: 68.9 kg (151 lb 12.8 oz)     Height:       Filed Weights   08/23/15 1926 08/24/15 0300 08/25/15 0547  Weight: 68 kg (150 lb) 68.9 kg (151 lb 14.4 oz) 68.9 kg (151 lb 12.8 oz)   Body mass index is 28.68 kg/m.   Gen Exam: Awake and alert with clear speech. Not in any distress . Looks chronically sick appearing Neck: Supple, No JVD.   Chest: Moving air well-has fewScattered rhonchi but otherwise clear CVS: S1 S2 Regular, no murmurs.  Abdomen: soft, BS +, non tender, non distended.  Extremities: no edema, lower extremities warm to touch. Neurologic: Non Focal.   Skin: No Rash or lesions   Wounds: N/A.   I have personally reviewed following labs and imaging studies  LABORATORY DATA: CBC:  Recent Labs Lab 08/23/15 1940 08/24/15 1021 08/25/15 0537  WBC 13.3* 13.6* 11.6*  HGB 11.0* 9.3* 9.5*  HCT 30.7* 28.3* 27.8*  MCV 97.2 96.6 97.9  PLT 302 282 [id number]    Basic Metabolic Panel:  Recent Labs Lab 08/23/15 1940 08/24/15 1021 08/25/15 0537  NA 134* 136 135  K 3.9 3.6 3.8  CL 104 104 102  CO2 21* 25 26  GLUCOSE 129* 157* 130*  BUN 20 19 21*  CREATININE 1.03* 0.89 0.93  CALCIUM 8.7* 8.3* 8.5*  MG  --  2.1  --     GFR: Estimated Creatinine Clearance: 37.1 mL/min (by C-G formula based on SCr of 0.93 mg/dL).  Liver Function Tests: No results for  input(s): AST, ALT, ALKPHOS, BILITOT, PROT, ALBUMIN in the last 168 hours. No results for input(s): LIPASE, AMYLASE in the last 168 hours. No results for input(s): AMMONIA in the last 168 hours.  Coagulation Profile:  Recent Labs Lab 08/23/15 2234  INR 1.53    Cardiac Enzymes:  Recent Labs Lab 08/23/15 2234 08/24/15 0247 08/24/15 1021  TROPONINI 0.08* 0.09* 0.07*    BNP (last 3 results) No results for input(s): PROBNP in the last 8760 hours.  HbA1C:  Recent Labs  08/23/15 1940  HGBA1C 5.2    CBG:  Recent Labs Lab 08/24/15 0433  GLUCAP 129*    Lipid Profile:  Recent Labs  08/24/15 0247  CHOL 96  HDL 38*  LDLCALC 48  TRIG 50  CHOLHDL 2.5    Thyroid Function Tests: No results for input(s): TSH, T4TOTAL, FREET4, T3FREE, THYROIDAB in the last 72 hours.  Anemia Panel: No results for input(s): VITAMINB12, FOLATE, FERRITIN, TIBC, IRON, RETICCTPCT in the last 72 hours.  Urine analysis:    Component Value Date/Time   COLORURINE AMBER (A) 08/24/2015 0450   APPEARANCEUR CLOUDY (A) 08/24/2015 0450   LABSPEC 1.027 08/24/2015 0450   LABSPEC 1.010 10/29/2013 1105   PHURINE 5.5 08/24/2015 0450   GLUCOSEU NEGATIVE 08/24/2015 0450   GLUCOSEU Negative 10/29/2013 1105   HGBUR MODERATE (A) 08/24/2015 0450   BILIRUBINUR SMALL (A) 08/24/2015 0450   BILIRUBINUR Negative 10/29/2013 1105   KETONESUR 15 (A) 08/24/2015 0450   PROTEINUR 100 (A) 08/24/2015 0450   UROBILINOGEN 1.0 02/13/2014 1137   UROBILINOGEN 0.2 10/29/2013 1105   NITRITE NEGATIVE 08/24/2015 0450   LEUKOCYTESUR MODERATE (A) 08/24/2015 0450   LEUKOCYTESUR Trace 10/29/2013 1105    Sepsis Labs: Lactic Acid, Venous    Component Value Date/Time   LATICACIDVEN 1.1 08/24/2015 0247    MICROBIOLOGY: Recent Results (from the past 240 hour(s))  Culture, blood (Routine x 2)     Status: None (Preliminary result)   Collection Time: 08/23/15  7:45 PM  Result Value Ref Range Status   Specimen  Description BLOOD LEFT HAND  Final   Special Requests IN PEDIATRIC BOTTLE 4CC  Final   Culture NO GROWTH < 24 HOURS  Final   Report Status PENDING  Incomplete  Culture, blood (Routine x 2)     Status: None (Preliminary result)   Collection Time: 08/23/15  7:55 PM  Result Value Ref Range Status   Specimen Description BLOOD LEFT HAND  Final   Special Requests BOTTLES DRAWN AEROBIC AND ANAEROBIC 5CC  Final   Culture NO GROWTH < 24 HOURS  Final   Report Status PENDING  Incomplete  MRSA PCR Screening     Status: None   Collection Time: 08/24/15  2:27 AM  Result Value Ref Range Status   MRSA by PCR NEGATIVE NEGATIVE Final    Comment:        The GeneXpert MRSA Assay (FDA approved for NASAL specimens only), is one component of a comprehensive MRSA colonization surveillance program. It is not intended to diagnose MRSA infection nor to guide or monitor treatment for MRSA infections.   Respiratory Panel by PCR     Status: None   Collection Time: 08/24/15  3:47 AM  Result Value Ref Range Status   Adenovirus NOT DETECTED NOT DETECTED Final   Coronavirus 229E NOT DETECTED NOT DETECTED Final   Coronavirus HKU1 NOT DETECTED NOT DETECTED Final   Coronavirus NL63 NOT DETECTED NOT DETECTED Final   Coronavirus OC43 NOT DETECTED NOT DETECTED Final   Metapneumovirus NOT DETECTED NOT DETECTED Final   Rhinovirus / Enterovirus NOT DETECTED NOT DETECTED Final   Influenza A NOT DETECTED NOT DETECTED Final   Influenza B NOT DETECTED NOT DETECTED Final   Parainfluenza Virus 1 NOT DETECTED NOT DETECTED Final   Parainfluenza Virus 2 NOT DETECTED NOT DETECTED Final   Parainfluenza Virus 3 NOT DETECTED NOT DETECTED Final   Parainfluenza Virus 4 NOT DETECTED NOT DETECTED Final   Respiratory Syncytial Virus NOT DETECTED NOT DETECTED Final   Bordetella pertussis NOT DETECTED NOT DETECTED Final   Chlamydophila pneumoniae NOT DETECTED NOT DETECTED Final   Mycoplasma pneumoniae NOT DETECTED NOT DETECTED Final      RADIOLOGY STUDIES/RESULTS: Dg Chest 2 View  Result Date: 08/23/2015 CLINICAL DATA:  Patient with productive cough and fever. EXAM: CHEST  2 VIEW COMPARISON:  Chest radiograph 04/11/2015 FINDINGS: Stable cardiomegaly. Interval development of  peripheral consolidation within the left mid lung. No pleural effusion or pneumothorax. Right shoulder joint degenerative changes. Thoracic spine degenerative changes. Multiple calcified thyroid nodules. IMPRESSION: New peripheral consolidation within the left mid lung concerning for pneumonia. Followup PA and lateral chest X-ray is recommended in 3-4 weeks following trial of antibiotic therapy to ensure resolution and exclude underlying malignancy. Electronically Signed   By: Lovey Newcomer M.D.   On: 08/23/2015 21:18     LOS: 2 days   Oren Binet, MD  [facility] Pager:[phone]  If 7PM-7AM, please contact night-coverage [email or web address] Password [facility] 08/25/2015, 10:43 AM

## 2015-08-25 NOTE — Progress Notes (Signed)
  Echocardiogram 2D Echocardiogram with Definity has been performed.  Diamond Nickel 08/25/2015, 2:11 PM

## 2015-08-25 NOTE — Progress Notes (Signed)
Physical Therapy Treatment Patient Details Name: [name] MRN: [id number] DOB: 06-16-1925 Today's Date: 08/25/2015    History of Present Illness Patient is a 82 y/o female with hx of HTN, CHF, CVA, COPD, abdominal abscess, colostomy and takedown presents with Fever, cough, shortness of breath, chest pain. Chest x-ray showed infiltration in left middle lobe. Admitted for CAP and sepsis.    PT Comments    Pt very motivated to participate with therapy. Increased gait distance noted today from 100 feet to 200 feet.   Follow Up Recommendations  Home health PT;Supervision for mobility/OOB     Equipment Recommendations  None recommended by PT    Recommendations for Other Services       Precautions / Restrictions Precautions Precautions: Fall;Other (comment) Precaution Comments: watch 02    Mobility  Bed Mobility               General bed mobility comments: Pt up in recliner and returned to recliner after ambulation.  Transfers   Equipment used: Rolling walker (2 wheeled)   Sit to Stand: Min guard         General transfer comment: increased time to complete at min guard level.  Ambulation/Gait Ambulation/Gait assistance: Min guard Ambulation Distance (Feet): 200 Feet Assistive device: Rolling walker (2 wheeled) Gait Pattern/deviations: Step-through pattern;Decreased stride length;Trunk flexed Gait velocity: decreased   General Gait Details: slow, steady gait. Pt ambulated on 2 L O2. 2/4 DOE. SpO2 decreased to 86% during ambulation. Cues for pursed lip breathing provided. Pt back to 92% upon return to recliner in room.   Stairs            Wheelchair Mobility    Modified Rankin (Stroke Patients Only)       Balance   Sitting-balance support: Feet supported;No upper extremity supported Sitting balance-Leahy Scale: Good     Standing balance support: During functional activity;Bilateral upper extremity supported Standing balance-Leahy Scale:  Poor                      Cognition Arousal/Alertness: Awake/alert Behavior During Therapy: WFL for tasks assessed/performed Overall Cognitive Status: Within Functional Limits for tasks assessed                      Exercises      General Comments        Pertinent Vitals/Pain Pain Assessment: No/denies pain    Home Living                      Prior Function            PT Goals (current goals can now be found in the care plan section) Acute Rehab PT Goals Patient Stated Goal: to get better so I can go home PT Goal Formulation: With patient Time For Goal Achievement: 09/07/15 Potential to Achieve Goals: Good Progress towards PT goals: Progressing toward goals    Frequency  Min 3X/week    PT Plan Current plan remains appropriate    Co-evaluation             End of Session Equipment Utilized During Treatment: Gait belt;Oxygen Activity Tolerance: Patient tolerated treatment well Patient left: in chair;with call bell/phone within reach     Time: [id number] PT Time Calculation (min) (ACUTE ONLY): 24 min  Charges:  $Gait Training: 23-37 mins                    G  Codes:      Lorriane Shire 08/25/2015, 11:59 AM

## 2015-08-26 MED ORDER — FUROSEMIDE 40 MG PO TABS
40.0000 mg | ORAL_TABLET | Freq: Once | ORAL | Status: AC
  Administered 2015-08-26: 40 mg via ORAL
  Filled 2015-08-26: qty 1

## 2015-08-26 NOTE — Progress Notes (Signed)
Physical Therapy Treatment Patient Details Name: [name] MRN: [id number] DOB: 24-Apr-1925 Today's Date: 08/26/2015    History of Present Illness Patient is a 82 y/o female with hx of HTN, CHF, CVA, COPD, abdominal abscess, colostomy and takedown presents with Fever, cough, shortness of breath, chest pain. Chest x-ray showed infiltration in left middle lobe. Admitted for CAP and sepsis.    PT Comments    Patient educated on importance of activity, use of incentive spirometer, and pursed lip breathing (correct technique). She requires cues to self-monitor her level of fatigue and amount of activity she can tolerate when up on her feet (and when to take a break). Moving well for household distances, however could not tolerate greater distances (drop in SaO2 and loss of balance).   Follow Up Recommendations  Home health PT;Supervision for mobility/OOB     Equipment Recommendations  None recommended by PT    Recommendations for Other Services       Precautions / Restrictions Precautions Precautions: Fall;Other (comment) Precaution Comments: watch 02    Mobility  Bed Mobility                  Transfers Overall transfer level: Needs assistance Equipment used: Rolling walker (2 wheeled) Transfers: Sit to/from Stand Sit to Stand: Supervision;Modified independent (Device/Increase time)         General transfer comment: x 3; progressed to modified independent as no imbalance noted; good safety awareness  Ambulation/Gait Ambulation/Gait assistance: Min guard Ambulation Distance (Feet): 220 Feet Assistive device: Rolling walker (2 wheeled) Gait Pattern/deviations: Step-through pattern;Decreased stride length;Trunk flexed     General Gait Details: vc for slowing down x 2; vc for standing rest x 1; vc for not talking x 3; one side stagger step after 180 ft; on 2L O2 with decr to 86% and recover to 88-89% after 30 sec   Stairs            Wheelchair Mobility     Modified Rankin (Stroke Patients Only)       Balance           Standing balance support: No upper extremity supported;During functional activity Standing balance-Leahy Scale: Fair Standing balance comment: able to toilet with minguard assist for safety (no LOB); standing managing clothing                    Cognition Arousal/Alertness: Awake/alert Behavior During Therapy: WFL for tasks assessed/performed Overall Cognitive Status: Within Functional Limits for tasks assessed                      Exercises Other Exercises Other Exercises: educated on importance of pursed lip breathing and correct technique; educated on use of IS; educated on benefits of sitting upright (OOB) and ambulation    General Comments General comments (skin integrity, edema, etc.): daughter present and reports pt a bit weaker than her usual (she does walk into restaurants and church PTA)      Pertinent Vitals/Pain Pain Assessment: No/denies pain    Home Living                      Prior Function            PT Goals (current goals can now be found in the care plan section) Acute Rehab PT Goals Patient Stated Goal: to get better so I can go home Time For Goal Achievement: 09/07/15 Progress towards PT goals: Progressing toward goals  Frequency  Min 3X/week    PT Plan Current plan remains appropriate    Co-evaluation             End of Session Equipment Utilized During Treatment: Gait belt;Oxygen Activity Tolerance: Patient limited by fatigue Patient left: in chair;with call bell/phone within reach;with family/visitor present     Time: [id number] PT Time Calculation (min) (ACUTE ONLY): 32 min  Charges:  $Gait Training: 8-22 mins $Therapeutic Activity: 8-22 mins                    G Codes:      Shavawn Stobaugh 15-Sep-2015, 7:22 PM  Pager [phone]

## 2015-08-26 NOTE — Consult Note (Addendum)
   [name] CM Inpatient Consult   08/26/2015  [name] 1925/10/03 [id number]  Late entry for 08/25/15 11:00 am -  Patient was screened for potential services with [facility] on 08/25/15 for COPD program and has home 02 and lives alone.  Patient has [name]. Met with the patient and explained services and she endorses Leonard Downing, MD as her primary care provider.  Patient had signed consent for services in the event she would be able to have care management services.  After review and search of the [place] registry it was found that the patient's primary provider is not a [facility] provider and therefore she will not be eligible for [facility] Care Management services.  Patient states she does her the tele-monitoring at home but admits that with her being so sick she had not weighed but the nurses call her as well to check for symptoms and needs.  Will follow up with inpatient RNCM and alert of the findings and follow up with the patient as well.  For questions, please contact:  Natividad Brood, RN BSN [facility] Liaison  [phone] business mobile phone Toll free office [phone]

## 2015-08-26 NOTE — Consult Note (Signed)
   [facility] Inpatient Consult   08/26/2015  [name] [birth year] [id number]  Met with the patient and her daughter [name] regarding [facility] not able to follow up post hospital because her primary care provider is not in the [place].  Patient and daughter verbalized understanding.  Daughter states that the tele-monitoring follow up will be ongoing and the patient endorses she had Victor in the past. No further questions form patient/family.  For questions, please contact:  Natividad Brood, RN BSN [facility] Liaison  [phone] business mobile phone Toll free office [phone]

## 2015-08-26 NOTE — Progress Notes (Signed)
PROGRESS NOTE        PATIENT DETAILS Name: TENILE ULERY Age: 82 y.o. Sex: female Date of Birth: [birth year] Admit Date: 08/23/2015 Admitting Physician Ivor Costa, MD [id number] Danne Baxter, MD  Brief Narrative: Patient is a 82 y.o. female with past medical history of COPD on 2 L of oxygen at home, chronic diastolic heart failure presented to the hospital with fever, cough, and shortness of breath. Chest x-ray showed left-sided pneumonia. She briefly required BiPAP in the emergency room, she was subsequently given IV antibiotics, bronchodilators and admitted to the hospitalist service.  Subjective: Continues to improve-feels as if she is now almost back to her baseline.  Assessment/Plan: Principal Problem: Acute on chronic hypoxemic respiratory failure: Acute respiratory failure likely secondary to community acquired pneumonia. Initially required BiPAP in the emergency room, much improved and back on nasal cannula. Slowly titrate down oxygen to usual home regimen of 2 L/m.  Active Problems: Sepsis secondary to community acquired pneumonia: Sepsis pathophysiology has resolved, she is clinically improved- continue Rocephin and Zithromax. Urine streptococcal antigen negative, Legionella antigen is pending. Blood cultures are negative so far. Suspect if clinical improvement continues, she can be transitioned to oral antibiotics and discharged home tomorrow.   Minimally elevated troponins: EKG negative, troponin trend is flat and not consistent with ACS. Check echo, continue Plavix and follow. Furthermore she does not have any chest pain, shortness of breath is markedly improved this morning.  Anemia: No evidence of blood loss, suspect has slight decrease in hemoglobin than usual baseline due to acute illness. Follow  COPD: Lungs are clear-evidence of exacerbation this morning. Continue bronchodilators.  Chronic diastolic heart failure: Appears compensated, shortness  of breath likely secondary to pneumonia/COPD. Will give one dose of intravenous Lasix today- follow daily weights.  History of recent CVA: Nonfocal exam, continue Plavix and statin.  Hypertension: Controlled, continue lisinopril  Dyslipidemia: Continue statins.  DVT Prophylaxis: Prophylactic Lovenox   Code Status: Partial code  Family Communication:  None at bedside-patient is awake and alert and understanding of the above noted plan.   Disposition Plan: Remain inpatient-home possibly tomorrow with home health services  Antimicrobial agents: IV Rocephin 8/22>> IV Zithromax 8/22>>  Procedures: None  CONSULTS:  None  Time spent: 25  minutes-Greater than 50% of this time was spent in counseling, explanation of diagnosis, planning of further management, and coordination of care.  MEDICATIONS: Anti-infectives    Start     Dose/Rate Route Frequency Ordered Stop   08/25/15 1800  azithromycin (ZITHROMAX) tablet 500 mg     500 mg Oral Daily 08/25/15 1026 08/30/15 1759   08/24/15 2200  cefTRIAXone (ROCEPHIN) 1 g in dextrose 5 % 50 mL IVPB     1 g 100 mL/hr over 30 Minutes Intravenous Every 24 hours 08/23/15 2219 08/30/15 2159   08/24/15 2200  azithromycin (ZITHROMAX) 500 mg in dextrose 5 % 250 mL IVPB  Status:  Discontinued     500 mg 250 mL/hr over 60 Minutes Intravenous Every 24 hours 08/23/15 2219 08/25/15 1026   08/23/15 2045  cefTRIAXone (ROCEPHIN) 1 g in dextrose 5 % 50 mL IVPB     1 g 100 mL/hr over 30 Minutes Intravenous  Once 08/23/15 2044 08/23/15 2217   08/23/15 2045  azithromycin (ZITHROMAX) 500 mg in dextrose 5 % 250 mL IVPB     500 mg 250 mL/hr  over 60 Minutes Intravenous  Once 08/23/15 2044 08/23/15 2330      Scheduled Meds: . aspirin  325 mg Oral Daily  . atorvastatin  40 mg Oral q1800  . azithromycin  500 mg Oral Daily  . budesonide-formoterol  2 puff Inhalation BID  . cefTRIAXone (ROCEPHIN)  IV  1 g Intravenous Q24H  . clopidogrel  75 mg Oral Daily    . dextromethorphan-guaiFENesin  1 tablet Oral BID  . enoxaparin (LOVENOX) injection  40 mg Subcutaneous Daily  . furosemide  20 mg Oral Daily  . ipratropium-albuterol  3 mL Nebulization TID  . lisinopril  20 mg Oral Daily   Continuous Infusions:  PRN Meds:.acetaminophen, albuterol, magnesium hydroxide, nitroGLYCERIN, oxyCODONE-acetaminophen, polyvinyl alcohol   PHYSICAL EXAM: Vital signs: Vitals:   08/25/15 2029 08/25/15 2031 08/26/15 0433 08/26/15 0916  BP:   (!) 149/59 (!) 119/50  Pulse:   71 73  Resp:   20 20  Temp:   98.5 F (36.9 C) 98.1 F (36.7 C)  TempSrc:   Oral Oral  SpO2: 97% 97% 93% 96%  Weight:   70.7 kg (155 lb 12.8 oz)   Height:       Filed Weights   08/24/15 0300 08/25/15 0547 08/26/15 0433  Weight: 68.9 kg (151 lb 14.4 oz) 68.9 kg (151 lb 12.8 oz) 70.7 kg (155 lb 12.8 oz)   Body mass index is 29.44 kg/m.   Gen Exam: Awake and alert with clear speech. Not in any distress . Looks chronically sick appearing Neck: Supple, No JVD.   Chest: Moving air well-has fewScattered rhonchi but otherwise clear CVS: S1 S2 Regular, no murmurs.  Abdomen: soft, BS +, non tender, non distended.  Extremities: no edema, lower extremities warm to touch. Neurologic: Non Focal.   Skin: No Rash or lesions   Wounds: N/A.   I have personally reviewed following labs and imaging studies  LABORATORY DATA: CBC:  Recent Labs Lab 08/23/15 1940 08/24/15 1021 08/25/15 0537  WBC 13.3* 13.6* 11.6*  HGB 11.0* 9.3* 9.5*  HCT 30.7* 28.3* 27.8*  MCV 97.2 96.6 97.9  PLT 302 282 [id number]    Basic Metabolic Panel:  Recent Labs Lab 08/23/15 1940 08/24/15 1021 08/25/15 0537  NA 134* 136 135  K 3.9 3.6 3.8  CL 104 104 102  CO2 21* 25 26  GLUCOSE 129* 157* 130*  BUN 20 19 21*  CREATININE 1.03* 0.89 0.93  CALCIUM 8.7* 8.3* 8.5*  MG  --  2.1  --     GFR: Estimated Creatinine Clearance: 37.6 mL/min (by C-G formula based on SCr of 0.93 mg/dL).  Liver Function Tests: No  results for input(s): AST, ALT, ALKPHOS, BILITOT, PROT, ALBUMIN in the last 168 hours. No results for input(s): LIPASE, AMYLASE in the last 168 hours. No results for input(s): AMMONIA in the last 168 hours.  Coagulation Profile:  Recent Labs Lab 08/23/15 2234  INR 1.53    Cardiac Enzymes:  Recent Labs Lab 08/23/15 2234 08/24/15 0247 08/24/15 1021  TROPONINI 0.08* 0.09* 0.07*    BNP (last 3 results) No results for input(s): PROBNP in the last 8760 hours.  HbA1C:  Recent Labs  08/23/15 1940  HGBA1C 5.2    CBG:  Recent Labs Lab 08/24/15 0433  GLUCAP 129*    Lipid Profile:  Recent Labs  08/24/15 0247  CHOL 96  HDL 38*  LDLCALC 48  TRIG 50  CHOLHDL 2.5    Thyroid Function Tests: No results for input(s): TSH, T4TOTAL,  FREET4, T3FREE, THYROIDAB in the last 72 hours.  Anemia Panel: No results for input(s): VITAMINB12, FOLATE, FERRITIN, TIBC, IRON, RETICCTPCT in the last 72 hours.  Urine analysis:    Component Value Date/Time   COLORURINE AMBER (A) 08/24/2015 0450   APPEARANCEUR CLOUDY (A) 08/24/2015 0450   LABSPEC 1.027 08/24/2015 0450   LABSPEC 1.010 10/29/2013 1105   PHURINE 5.5 08/24/2015 0450   GLUCOSEU NEGATIVE 08/24/2015 0450   GLUCOSEU Negative 10/29/2013 1105   HGBUR MODERATE (A) 08/24/2015 0450   BILIRUBINUR SMALL (A) 08/24/2015 0450   BILIRUBINUR Negative 10/29/2013 1105   KETONESUR 15 (A) 08/24/2015 0450   PROTEINUR 100 (A) 08/24/2015 0450   UROBILINOGEN 1.0 02/13/2014 1137   UROBILINOGEN 0.2 10/29/2013 1105   NITRITE NEGATIVE 08/24/2015 0450   LEUKOCYTESUR MODERATE (A) 08/24/2015 0450   LEUKOCYTESUR Trace 10/29/2013 1105    Sepsis Labs: Lactic Acid, Venous    Component Value Date/Time   LATICACIDVEN 1.1 08/24/2015 0247    MICROBIOLOGY: Recent Results (from the past 240 hour(s))  Culture, blood (Routine x 2)     Status: None (Preliminary result)   Collection Time: 08/23/15  7:45 PM  Result Value Ref Range Status    Specimen Description BLOOD LEFT HAND  Final   Special Requests IN PEDIATRIC BOTTLE 4CC  Final   Culture NO GROWTH 2 DAYS  Final   Report Status PENDING  Incomplete  Culture, blood (Routine x 2)     Status: None (Preliminary result)   Collection Time: 08/23/15  7:55 PM  Result Value Ref Range Status   Specimen Description BLOOD LEFT HAND  Final   Special Requests BOTTLES DRAWN AEROBIC AND ANAEROBIC 5CC  Final   Culture NO GROWTH 2 DAYS  Final   Report Status PENDING  Incomplete  MRSA PCR Screening     Status: None   Collection Time: 08/24/15  2:27 AM  Result Value Ref Range Status   MRSA by PCR NEGATIVE NEGATIVE Final    Comment:        The GeneXpert MRSA Assay (FDA approved for NASAL specimens only), is one component of a comprehensive MRSA colonization surveillance program. It is not intended to diagnose MRSA infection nor to guide or monitor treatment for MRSA infections.   Respiratory Panel by PCR     Status: None   Collection Time: 08/24/15  3:47 AM  Result Value Ref Range Status   Adenovirus NOT DETECTED NOT DETECTED Final   Coronavirus 229E NOT DETECTED NOT DETECTED Final   Coronavirus HKU1 NOT DETECTED NOT DETECTED Final   Coronavirus NL63 NOT DETECTED NOT DETECTED Final   Coronavirus OC43 NOT DETECTED NOT DETECTED Final   Metapneumovirus NOT DETECTED NOT DETECTED Final   Rhinovirus / Enterovirus NOT DETECTED NOT DETECTED Final   Influenza A NOT DETECTED NOT DETECTED Final   Influenza B NOT DETECTED NOT DETECTED Final   Parainfluenza Virus 1 NOT DETECTED NOT DETECTED Final   Parainfluenza Virus 2 NOT DETECTED NOT DETECTED Final   Parainfluenza Virus 3 NOT DETECTED NOT DETECTED Final   Parainfluenza Virus 4 NOT DETECTED NOT DETECTED Final   Respiratory Syncytial Virus NOT DETECTED NOT DETECTED Final   Bordetella pertussis NOT DETECTED NOT DETECTED Final   Chlamydophila pneumoniae NOT DETECTED NOT DETECTED Final   Mycoplasma pneumoniae NOT DETECTED NOT DETECTED Final     RADIOLOGY STUDIES/RESULTS: Dg Chest 2 View  Result Date: 08/23/2015 CLINICAL DATA:  Patient with productive cough and fever. EXAM: CHEST  2 VIEW COMPARISON:  Chest radiograph 04/11/2015  FINDINGS: Stable cardiomegaly. Interval development of peripheral consolidation within the left mid lung. No pleural effusion or pneumothorax. Right shoulder joint degenerative changes. Thoracic spine degenerative changes. Multiple calcified thyroid nodules. IMPRESSION: New peripheral consolidation within the left mid lung concerning for pneumonia. Followup PA and lateral chest X-ray is recommended in 3-4 weeks following trial of antibiotic therapy to ensure resolution and exclude underlying malignancy. Electronically Signed   By: Lovey Newcomer M.D.   On: 08/23/2015 21:18     LOS: 3 days   Oren Binet, MD  [facility] Pager:[phone]  If 7PM-7AM, please contact night-coverage [email or web address] Password [facility] 08/26/2015, 12:56 PM

## 2015-08-26 NOTE — Care Management Important Message (Signed)
Important Message  Patient Details  Name: [name] MRN: [id number] Date of Birth: July 31, 1925   Medicare Important Message Given:  Yes    Tymothy Cass 08/26/2015, 11:28 AM

## 2015-08-27 DIAGNOSIS — I480 Paroxysmal atrial fibrillation: Secondary | ICD-10-CM | POA: Clinically undetermined

## 2015-08-27 LAB — BASIC METABOLIC PANEL
ANION GAP: 8 (ref 5–15)
BUN: 14 mg/dL (ref 6–20)
CHLORIDE: 103 mmol/L (ref 101–111)
CO2: 25 mmol/L (ref 22–32)
Calcium: 8.3 mg/dL — ABNORMAL LOW (ref 8.9–10.3)
Creatinine, Ser: 0.87 mg/dL (ref 0.44–1.00)
GFR calc Af Amer: >60 mL/min (ref >60)
GFR, EST NON AFRICAN AMERICAN: 58 mL/min — AB (ref >60)
Glucose, Bld: 157 mg/dL — ABNORMAL HIGH (ref 65–99)
POTASSIUM: 3.8 mmol/L (ref 3.5–5.1)
SODIUM: 136 mmol/L (ref 135–145)

## 2015-08-27 LAB — LEGIONELLA PNEUMOPHILA SEROGP 1 UR AG: L. PNEUMOPHILA SEROGP 1 UR AG: NEGATIVE

## 2015-08-27 MED ORDER — FUROSEMIDE 40 MG PO TABS
40.0000 mg | ORAL_TABLET | Freq: Every day | ORAL | Status: DC
Start: 2015-08-27 — End: 2015-08-28
  Administered 2015-08-27: 40 mg via ORAL
  Filled 2015-08-27: qty 1

## 2015-08-27 MED ORDER — APIXABAN 5 MG PO TABS
5.0000 mg | ORAL_TABLET | Freq: Two times a day (BID) | ORAL | Status: DC
  Administered 2015-08-27 – 2015-08-28 (×3): 5 mg via ORAL
  Filled 2015-08-27 (×3): qty 1

## 2015-08-27 MED ORDER — POTASSIUM CHLORIDE CRYS ER 20 MEQ PO TBCR
20.0000 meq | EXTENDED_RELEASE_TABLET | Freq: Every day | ORAL | Status: DC
  Administered 2015-08-27 – 2015-08-28 (×2): 20 meq via ORAL
  Filled 2015-08-27 (×2): qty 1

## 2015-08-27 NOTE — Progress Notes (Addendum)
PROGRESS NOTE        PATIENT DETAILS Name: [name] Age: 82 y.o. Sex: female Date of Birth: Dec 07, 1925 Admit Date: 08/23/2015 Admitting Physician Ivor Costa, MD [id number] Danne Baxter, MD  Brief Narrative: Patient is a 82 y.o. female with past medical history of COPD on 2 L of oxygen at home, chronic diastolic heart failure presented to the hospital with fever, cough, and shortness of breath. Chest x-ray showed left-sided pneumonia. She briefly required BiPAP in the emergency room, she was subsequently given IV antibiotics, bronchodilators and admitted to the hospitalist service. She has significantly improved, and is now back on her usual home regimen of 2 L/m. Hospital course complicated by 2 brief runs of atrial flutter/A. fib. Given recent history of cryptogenic stroke, after discussion with stroke M.D.-patient has been started on Eliquis.  Subjective: Continues to slowly improve-her main issue at this time is deconditioning, her breathing is back to her baseline. f  Assessment/Plan: Principal Problem: Acute on chronic hypoxemic respiratory failure: Acute respiratory failure likely secondary to community acquired pneumonia. Initially required BiPAP in the emergency room, much improved and back on nasal cannula. Slowly titrated down oxygen to usual home regimen of 2 L/m.  Active Problems: Sepsis secondary to community acquired pneumonia: Sepsis pathophysiology has resolved, she is clinically improved- continue Rocephin and Zithromax. Urine streptococcal antigen negative, Legionella antigen is pending. Blood cultures are negative so far. Her main issue at this time is deconditioning and generalized weakness. She will be discharged home on 8/27 with home health services.  Minimally elevated troponins: EKG negative, troponin trend is flat and not consistent with ACS. Transthoracic echocardiogram, shows EF around 40-45%. She has now been started on anticoagulation  with Eliquis, and antiplatelet agents have been discontinued. She never had chest pain, shortness of breath was likely secondary to pneumonia and has markedly improved.  Paroxysmal atrial fibrillation: 2 episodes of brief runs of A. fib/A flutter seen on telemetry-case was discussed with neurology-Dr. Carren Rang in turn discussed case with Dr.Tilley-the recommendations are to start anticoagulation with Eliquis. I subsequently spoke with the patient, advised benefits versus risks (son at bedside) of anticoagulation, she is agreeable to start anticoagulation  Anemia: No evidence of blood loss, suspect has slight decrease in hemoglobin than usual baseline due to acute illness. Follow  COPD: Lungs are clear-evidence of exacerbation this morning. Continue bronchodilators.  Chronic diastolic heart failure: Appears compensated, shortness of breath likely secondary to pneumonia/COPD. continue Lasix.   History of recent CVA: Nonfocal exam, previously on Plavix, and given findings of paroxysmal A. fib/A flutter on telemetry, after discussion with neurology has been started on Eliquis. Plavix has been discontinued.   Hypertension: Controlled, continue lisinopril  Dyslipidemia: Continue statins.  DVT Prophylaxis: On Eliquis  Code Status: Partial code  Family Communication:  Son at beside, had spoken with daughter yesterday  Disposition Plan: Remain inpatient-home possibly tomorrow with home health services  Antimicrobial agents: IV Rocephin 8/22>> IV Zithromax 8/22>>  Procedures: None  CONSULTS:  None  Time spent: 25  minutes-Greater than 50% of this time was spent in counseling, explanation of diagnosis, planning of further management, and coordination of care.  MEDICATIONS: Anti-infectives    Start     Dose/Rate Route Frequency Ordered Stop   08/25/15 1800  azithromycin (ZITHROMAX) tablet 500 mg     500 mg Oral Daily 08/25/15 1026 08/30/15 1759  08/24/15 2200  cefTRIAXone (ROCEPHIN)  1 g in dextrose 5 % 50 mL IVPB     1 g 100 mL/hr over 30 Minutes Intravenous Every 24 hours 08/23/15 2219 08/30/15 2159   08/24/15 2200  azithromycin (ZITHROMAX) 500 mg in dextrose 5 % 250 mL IVPB  Status:  Discontinued     500 mg 250 mL/hr over 60 Minutes Intravenous Every 24 hours 08/23/15 2219 08/25/15 1026   08/23/15 2045  cefTRIAXone (ROCEPHIN) 1 g in dextrose 5 % 50 mL IVPB     1 g 100 mL/hr over 30 Minutes Intravenous  Once 08/23/15 2044 08/23/15 2217   08/23/15 2045  azithromycin (ZITHROMAX) 500 mg in dextrose 5 % 250 mL IVPB     500 mg 250 mL/hr over 60 Minutes Intravenous  Once 08/23/15 2044 08/23/15 2330      Scheduled Meds: . apixaban  5 mg Oral BID  . atorvastatin  40 mg Oral q1800  . azithromycin  500 mg Oral Daily  . budesonide-formoterol  2 puff Inhalation BID  . cefTRIAXone (ROCEPHIN)  IV  1 g Intravenous Q24H  . dextromethorphan-guaiFENesin  1 tablet Oral BID  . ipratropium-albuterol  3 mL Nebulization TID  . lisinopril  20 mg Oral Daily   Continuous Infusions:  PRN Meds:.acetaminophen, albuterol, magnesium hydroxide, nitroGLYCERIN, oxyCODONE-acetaminophen, polyvinyl alcohol   PHYSICAL EXAM: Vital signs: Vitals:   08/26/15 2038 08/27/15 0611 08/27/15 0922 08/27/15 1131  BP:  126/61  (!) 130/53  Pulse:  75  70  Resp:  20  18  Temp:  98.3 F (36.8 C)  98.3 F (36.8 C)  TempSrc:  Oral  Oral  SpO2: 95% 96% 96% 95%  Weight:  70.6 kg (155 lb 11.2 oz)    Height:       Filed Weights   08/25/15 0547 08/26/15 0433 08/27/15 0611  Weight: 68.9 kg (151 lb 12.8 oz) 70.7 kg (155 lb 12.8 oz) 70.6 kg (155 lb 11.2 oz)   Body mass index is 29.42 kg/m.   Gen Exam: Awake and alert with clear speech. Not in any distress . Looks chronically sick appearing Neck: Supple, No JVD.   Chest: Moving air well-has fewScattered rhonchi but otherwise clear CVS: S1 S2 Regular, no murmurs.  Abdomen: soft, BS +, non tender, non distended.  Extremities: no edema, lower extremities  warm to touch. Neurologic: Non Focal.   Skin: No Rash or lesions   Wounds: N/A.   I have personally reviewed following labs and imaging studies  LABORATORY DATA: CBC:  Recent Labs Lab 08/23/15 1940 08/24/15 1021 08/25/15 0537  WBC 13.3* 13.6* 11.6*  HGB 11.0* 9.3* 9.5*  HCT 30.7* 28.3* 27.8*  MCV 97.2 96.6 97.9  PLT 302 282 123456    Basic Metabolic Panel:  Recent Labs Lab 08/23/15 1940 08/24/15 1021 08/25/15 0537  NA 134* 136 135  K 3.9 3.6 3.8  CL 104 104 102  CO2 21* 25 26  GLUCOSE 129* 157* 130*  BUN 20 19 21*  CREATININE 1.03* 0.89 0.93  CALCIUM 8.7* 8.3* 8.5*  MG  --  2.1  --     GFR: Estimated Creatinine Clearance: 37.6 mL/min (by C-G formula based on SCr of 0.93 mg/dL).  Liver Function Tests: No results for input(s): AST, ALT, ALKPHOS, BILITOT, PROT, ALBUMIN in the last 168 hours. No results for input(s): LIPASE, AMYLASE in the last 168 hours. No results for input(s): AMMONIA in the last 168 hours.  Coagulation Profile:  Recent Labs Lab 08/23/15 2234  INR 1.53    Cardiac Enzymes:  Recent Labs Lab 08/23/15 2234 08/24/15 0247 08/24/15 1021  TROPONINI 0.08* 0.09* 0.07*    BNP (last 3 results) No results for input(s): PROBNP in the last 8760 hours.  HbA1C: No results for input(s): HGBA1C in the last 72 hours.  CBG:  Recent Labs Lab 08/24/15 0433  GLUCAP 129*    Lipid Profile: No results for input(s): CHOL, HDL, LDLCALC, TRIG, CHOLHDL, LDLDIRECT in the last 72 hours.  Thyroid Function Tests: No results for input(s): TSH, T4TOTAL, FREET4, T3FREE, THYROIDAB in the last 72 hours.  Anemia Panel: No results for input(s): VITAMINB12, FOLATE, FERRITIN, TIBC, IRON, RETICCTPCT in the last 72 hours.  Urine analysis:    Component Value Date/Time   COLORURINE AMBER (A) 08/24/2015 0450   APPEARANCEUR CLOUDY (A) 08/24/2015 0450   LABSPEC 1.027 08/24/2015 0450   LABSPEC 1.010 10/29/2013 1105   PHURINE 5.5 08/24/2015 0450   GLUCOSEU  NEGATIVE 08/24/2015 0450   GLUCOSEU Negative 10/29/2013 1105   HGBUR MODERATE (A) 08/24/2015 0450   BILIRUBINUR SMALL (A) 08/24/2015 0450   BILIRUBINUR Negative 10/29/2013 1105   KETONESUR 15 (A) 08/24/2015 0450   PROTEINUR 100 (A) 08/24/2015 0450   UROBILINOGEN 1.0 02/13/2014 1137   UROBILINOGEN 0.2 10/29/2013 1105   NITRITE NEGATIVE 08/24/2015 0450   LEUKOCYTESUR MODERATE (A) 08/24/2015 0450   LEUKOCYTESUR Trace 10/29/2013 1105    Sepsis Labs: Lactic Acid, Venous    Component Value Date/Time   LATICACIDVEN 1.1 08/24/2015 0247    MICROBIOLOGY: Recent Results (from the past 240 hour(s))  Culture, blood (Routine x 2)     Status: None (Preliminary result)   Collection Time: 08/23/15  7:45 PM  Result Value Ref Range Status   Specimen Description BLOOD LEFT HAND  Final   Special Requests IN PEDIATRIC BOTTLE 4CC  Final   Culture NO GROWTH 3 DAYS  Final   Report Status PENDING  Incomplete  Culture, blood (Routine x 2)     Status: None (Preliminary result)   Collection Time: 08/23/15  7:55 PM  Result Value Ref Range Status   Specimen Description BLOOD LEFT HAND  Final   Special Requests BOTTLES DRAWN AEROBIC AND ANAEROBIC 5CC  Final   Culture NO GROWTH 3 DAYS  Final   Report Status PENDING  Incomplete  MRSA PCR Screening     Status: None   Collection Time: 08/24/15  2:27 AM  Result Value Ref Range Status   MRSA by PCR NEGATIVE NEGATIVE Final    Comment:        The GeneXpert MRSA Assay (FDA approved for NASAL specimens only), is one component of a comprehensive MRSA colonization surveillance program. It is not intended to diagnose MRSA infection nor to guide or monitor treatment for MRSA infections.   Respiratory Panel by PCR     Status: None   Collection Time: 08/24/15  3:47 AM  Result Value Ref Range Status   Adenovirus NOT DETECTED NOT DETECTED Final   Coronavirus 229E NOT DETECTED NOT DETECTED Final   Coronavirus HKU1 NOT DETECTED NOT DETECTED Final   Coronavirus  NL63 NOT DETECTED NOT DETECTED Final   Coronavirus OC43 NOT DETECTED NOT DETECTED Final   Metapneumovirus NOT DETECTED NOT DETECTED Final   Rhinovirus / Enterovirus NOT DETECTED NOT DETECTED Final   Influenza A NOT DETECTED NOT DETECTED Final   Influenza B NOT DETECTED NOT DETECTED Final   Parainfluenza Virus 1 NOT DETECTED NOT DETECTED Final   Parainfluenza Virus 2 NOT DETECTED  NOT DETECTED Final   Parainfluenza Virus 3 NOT DETECTED NOT DETECTED Final   Parainfluenza Virus 4 NOT DETECTED NOT DETECTED Final   Respiratory Syncytial Virus NOT DETECTED NOT DETECTED Final   Bordetella pertussis NOT DETECTED NOT DETECTED Final   Chlamydophila pneumoniae NOT DETECTED NOT DETECTED Final   Mycoplasma pneumoniae NOT DETECTED NOT DETECTED Final    RADIOLOGY STUDIES/RESULTS: Dg Chest 2 View  Result Date: 08/23/2015 CLINICAL DATA:  Patient with productive cough and fever. EXAM: CHEST  2 VIEW COMPARISON:  Chest radiograph 04/11/2015 FINDINGS: Stable cardiomegaly. Interval development of peripheral consolidation within the left mid lung. No pleural effusion or pneumothorax. Right shoulder joint degenerative changes. Thoracic spine degenerative changes. Multiple calcified thyroid nodules. IMPRESSION: New peripheral consolidation within the left mid lung concerning for pneumonia. Followup PA and lateral chest X-ray is recommended in 3-4 weeks following trial of antibiotic therapy to ensure resolution and exclude underlying malignancy. Electronically Signed   By: Lovey Newcomer M.D.   On: 08/23/2015 21:18     LOS: 4 days   Oren Binet, MD  [facility] Pager:[phone]  If 7PM-7AM, please contact night-coverage [email or web address] Password [facility] 08/27/2015, 12:42 PM

## 2015-08-27 NOTE — Progress Notes (Signed)
ANTICOAGULATION CONSULT NOTE - Initial Consult  Pharmacy Consult for Apixaban Indication: atrial fibrillation  Allergies  Allergen Reactions  . Indomethacin Anaphylaxis and Other (See Comments)    "took it fine for awhile; one day I stopped breathing and I ended up in the hospital" (01/02/2012) Pt has tolerated aspirin    Patient Measurements: Height: 5\' 1"  (154.9 cm) Weight: 155 lb 11.2 oz (70.6 kg) (scale b) IBW/kg (Calculated) : 47.8  Vital Signs: Temp: 98.3 F (36.8 C) (08/26 0611) Temp Source: Oral (08/26 0611) BP: 126/61 (08/26 0611) Pulse Rate: 75 (08/26 0611)  Labs:  Recent Labs  08/25/15 0537  HGB 9.5*  HCT 27.8*  PLT 312  CREATININE 0.93    Estimated Creatinine Clearance: 37.6 mL/min (by C-G formula based on SCr of 0.93 mg/dL).   Medical History: Past Medical History:  Diagnosis Date  . Anemia    years ago  . Arthritis    "right shoulder" (01/02/2012)  . Bladder mass   . CAP (community acquired pneumonia) 01/02/2012   Lower lobe   . CHF (congestive heart failure) ([place])   . COPD (chronic obstructive pulmonary disease) ([place])   . Difficulty sleeping   . Diverticulitis of colon with perforation 01/14/2012   That is post sigmoid resection with [facility] pouch and end colostomy   . GERD (gastroesophageal reflux disease)    occasional  . Goiter    radioactive iodine ablation/notes 01/02/2012  . History of kidney stones    X 1  . Hypertension   . Hypothyroidism    "I have taken Synthroid before" (01/02/2012)   HAS HAD RADIOACTIVE IODINE TX 2 YR S AGO  . Intra-abdominal abscess ([place]) 01/14/2012  . Pneumatosis of intestines 01/14/2012  . Pneumonia 01/02/2012; ? 2009  . Radiation 08/10/13-09/15/13   57 gray to vaginal/bladder mass  . SOB (shortness of breath)    'all the time" (01/02/2012)  . Stroke ([place])   . UTI (lower urinary tract infection) 01/03/2012     Assessment: 82 year old female to begin Eliquis for Afib Age > 80, weight > 60 kg, Scr < 1.5 --> 1 of 3,  qualifies for full dosing  Goal of Therapy:  Monitor platelets by anticoagulation protocol: Yes   Plan:  Eliquis 5 mg po BID Pharmacy to sign off and follow peripherally  Thank you Anette Guarneri, PharmD [phone]   08/27/2015,10:53 AM

## 2015-08-27 NOTE — Plan of Care (Signed)
Pt admitted for pneumonia. Discussed with Dr. Sloan Leiter that pt seems to have two episodes on tele with Afib or Aflutter on 8/24 and 8/26, brief, unsustained. Reviewed tele strip with Dr. Laurena Bering too and agree with either afib or aflutter. Given the history of cortical stroke in 04/2015 and old stroke at right MCA/PCA, I feel she should be anticoagulated for paroxysmal afib and stroke. She was on plavix alone in clinic in 06/2015 but now she is on ASA and plavix. Not sure why she is on DAPT, but will recommend to initiate eliquis 5mg  bid for stroke prevention and discontinue antiplatelet. She has follow up with me in clinic in 10/17/15. Plan discussed with Dr. Sloan Leiter.  Rosalin Hawking, MD PhD Stroke Neurology 08/27/2015 10:14 AM

## 2015-08-28 DIAGNOSIS — J9601 Acute respiratory failure with hypoxia: Secondary | ICD-10-CM

## 2015-08-28 LAB — CULTURE, BLOOD (ROUTINE X 2)
CULTURE: NO GROWTH
Culture: NO GROWTH

## 2015-08-28 MED ORDER — LEVOFLOXACIN 750 MG PO TABS: 750.0000 mg | ORAL_TABLET | Freq: Every day | ORAL | 0 refills | Status: AC

## 2015-08-28 MED ORDER — APIXABAN 5 MG PO TABS: 5.0000 mg | ORAL_TABLET | Freq: Two times a day (BID) | ORAL | 0 refills | Status: DC

## 2015-08-28 MED ORDER — FUROSEMIDE 20 MG PO TABS: 20.0000 mg | ORAL_TABLET | Freq: Every day | ORAL | Status: DC

## 2015-08-28 MED ORDER — FUROSEMIDE 10 MG/ML IJ SOLN
40.0000 mg | Freq: Once | INTRAMUSCULAR | Status: AC
  Administered 2015-08-28: 40 mg via INTRAVENOUS
  Filled 2015-08-28: qty 4

## 2015-08-28 NOTE — Progress Notes (Signed)
Pt d/c'd to home with family. D/c instructions and Rx given to and d/w pt and family. All verbalize understanding

## 2015-08-28 NOTE — Care Management Note (Addendum)
Case Management Note  Patient Details  Name: [name] MRN: [id number] Date of Birth: 12/10/1925  Subjective/Objective:                  Fever, cough, shortness of breath, chest pain Action/Plan: Discharge planning Expected Discharge Date:  08/28/15               Expected Discharge Plan:  Home w Home Health Services  In-House Referral:     Discharge planning Services  CM Consult  Post Acute Care Choice:  Home Health Choice offered to:  Patient  DME Arranged:    DME Agency:     HH Arranged:  PT HH Agency:  [facility]  Status of Service:  Completed, signed off  If discussed at Long Length of Stay Meetings, dates discussed:    Additional Comments: 11:00 CM has received a call from [facility] rep, Tiffany as they will not accept pt bc pt's PCP refuses to sign orders.  CM has explained this to pt who states she will follow up at her PCP.  No other CM needs were communicated. CM met with pt and gave pt Free 30 day trial card for Eliquis.  Pt verbalized understanding this card will cover today's prescription and give insurance time for authorization for refills. Pt chooses [facility] for HHPT.  Referral called to Tiffany.  Pt states she has all DMe at home (including home oxygen).  Pt states her son is providing transport home and will bring transport tank to room.  No other CM needs were communicated. ,  Christine, RN 08/28/2015, 10:05 AM  

## 2015-08-28 NOTE — Discharge Instructions (Signed)
Apixaban oral tablets °What is this medicine? °APIXABAN (a PIX a ban) is an anticoagulant (blood thinner). It is used to lower the chance of stroke in people with a medical condition called atrial fibrillation. It is also used to treat or prevent blood clots in the lungs or in the veins. °This medicine may be used for other purposes; ask your health care provider or pharmacist if you have questions. °What should I tell my health care provider before I take this medicine? °They need to know if you have any of these conditions: °-bleeding disorders °-bleeding in the brain °-blood in your stools (black or tarry stools) or if you have blood in your vomit °-history of stomach bleeding °-kidney disease °-liver disease °-mechanical heart valve °-an unusual or allergic reaction to apixaban, other medicines, foods, dyes, or preservatives °-pregnant or trying to get pregnant °-breast-feeding °How should I use this medicine? °Take this medicine by mouth with a glass of water. Follow the directions on the prescription label. You can take it with or without food. If it upsets your stomach, take it with food. Take your medicine at regular intervals. Do not take it more often than directed. Do not stop taking except on your doctor's advice. Stopping this medicine may increase your risk of a blot clot. Be sure to refill your prescription before you run out of medicine. °Talk to your pediatrician regarding the use of this medicine in children. Special care may be needed. °Overdosage: If you think you have taken too much of this medicine contact a poison control center or emergency room at once. °NOTE: This medicine is only for you. Do not share this medicine with others. °What if I miss a dose? °If you miss a dose, take it as soon as you can. If it is almost time for your next dose, take only that dose. Do not take double or extra doses. °What may interact with this medicine? °This medicine may interact with the following: °-aspirin  and aspirin-like medicines °-certain medicines for fungal infections like ketoconazole and itraconazole °-certain medicines for seizures like carbamazepine and phenytoin °-certain medicines that treat or prevent blood clots like warfarin, enoxaparin, and dalteparin °-clarithromycin °-NSAIDs, medicines for pain and inflammation, like ibuprofen or naproxen °-rifampin °-ritonavir °-St. John's wort °This list may not describe all possible interactions. Give your health care provider a list of all the medicines, herbs, non-prescription drugs, or dietary supplements you use. Also tell them if you smoke, drink alcohol, or use illegal drugs. Some items may interact with your medicine. °What should I watch for while using this medicine? °Notify your doctor or health care professional and seek emergency treatment if you develop breathing problems; changes in vision; chest pain; severe, sudden headache; pain, swelling, warmth in the leg; trouble speaking; sudden numbness or weakness of the face, arm, or leg. These can be signs that your condition has gotten worse. °If you are going to have surgery, tell your doctor or health care professional that you are taking this medicine. °Tell your health care professional that you use this medicine before you have a spinal or epidural procedure. Sometimes people who take this medicine have bleeding problems around the spine when they have a spinal or epidural procedure. This bleeding is very rare. If you have a spinal or epidural procedure while on this medicine, call your health care professional immediately if you have back pain, numbness or tingling (especially in your legs and feet), muscle weakness, paralysis, or loss of bladder or bowel   control. °Avoid sports and activities that might cause injury while you are using this medicine. Severe falls or injuries can cause unseen bleeding. Be careful when using sharp tools or knives. Consider using an electric razor. Take special care  brushing or flossing your teeth. Report any injuries, bruising, or red spots on the skin to your doctor or health care professional. °What side effects may I notice from receiving this medicine? °Side effects that you should report to your doctor or health care professional as soon as possible: °-allergic reactions like skin rash, itching or hives, swelling of the face, lips, or tongue °-signs and symptoms of bleeding such as bloody or black, tarry stools; red or dark-brown urine; spitting up blood or brown material that looks like coffee grounds; red spots on the skin; unusual bruising or bleeding from the eye, gums, or nose °This list may not describe all possible side effects. Call your doctor for medical advice about side effects. You may report side effects to FDA at 1-800-FDA-1088. °Where should I keep my medicine? °Keep out of the reach of children. °Store at room temperature between 20 and 25 degrees C (68 and 77 degrees F). Throw away any unused medicine after the expiration date. °NOTE: This sheet is a summary. It may not cover all possible information. If you have questions about this medicine, talk to your doctor, pharmacist, or health care provider. °  °© 2016, [employer]. (2012-08-22 11:59:24) ° °

## 2015-08-28 NOTE — Discharge Summary (Signed)
PATIENT DETAILS Name: [name] Age: 82 y.o. Sex: female Date of Birth: 09-02-1925 MRN: [id number]. Admitting Physician: Ivor Costa, MD [id number] Danne Baxter, MD  Admit Date: 08/23/2015 Discharge date: 08/28/2015  Recommendations for Outpatient Follow-up:  1. Follow up with PCP in 1-2 weeks 2. Please obtain BMP/CBC in one week 3. Please follow up on the following pending results: Blood cultures until final 4. Note-new medication and requests-stopped Plavix. 5. Repeat chest x-ray in 4-6 weeks to document resolution of pneumonia  Admitted From:  Home  Disposition: Home with home health [place]:  Yes  Equipment/Devices: oxygen 2L/m (chronic)  Discharge Condition: Stable  CODE STATUS: Partial Code ([place] intubation-No CPR)  Diet recommendation:  Heart Healthy   Brief Summary: See H&P, Labs, Consult and Test reports for all details in brief, Patient is a 82 y.o. female with past medical history of COPD on 2 L of oxygen at home, chronic diastolic heart failure presented to the hospital with fever, cough, and shortness of breath. Chest x-ray showed left-sided pneumonia. She briefly required BiPAP in the emergency room, she was subsequently given IV antibiotics, bronchodilators and admitted to the hospitalist service. She has significantly improved, and is now back on her usual home regimen of 2 L/m. Hospital course complicated by 2 brief runs of atrial flutter/A. fib. Given recent history of cryptogenic stroke, after discussion with stroke M.D.-patient has been started on Eliquis.  Brief Hospital Course: Acute on chronic hypoxemic respiratory failure: Acute respiratory failure likely secondary to community acquired pneumonia. Initially required BiPAP in the emergency room, much improved and back on nasal cannula. Slowly titrated down oxygen to usual home regimen of 2 L/m.  Active Problems: Sepsis secondary to community acquired pneumonia: Sepsis  pathophysiology has resolved, she is clinically improved- with Rocephin and Zithromax. We will transition her to oral levofloxacin for 1 more day. Urine streptococcal and legionella antigen negative,  Blood cultures are negative so far. Her main issue at this time is deconditioning and generalized weakness. She will be discharged home on 8/27 with home health services.  Minimally elevated troponins: EKG negative, troponin trend is flat and not consistent with ACS. Transthoracic echocardiogram, shows EF around 40-45%. She has now been started on anticoagulation with Eliquis, and antiplatelet agents have been discontinued. She never had chest pain, shortness of breath was likely secondary to pneumonia and has markedly improved.  Paroxysmal atrial fibrillation: 2 episodes of brief runs of A. fib/A flutter seen on telemetry-case was discussed with neurology-Dr. Erlinda Hong on 8/26-who in turn discussed case with Dr.Tilley-Recommendations are to start anticoagulation with Eliquis. I subsequently spoke with the patient, advised benefits versus risks (son at bedside) of anticoagulation on 8/26, she is agreeable to start anticoagulation  Anemia: No evidence of blood loss, suspect has slight decrease in hemoglobin than usual baseline due to acute illness. Follow closely as outpatient  COPD: Lungs are clear-evidence of exacerbation this morning. Continue bronchodilators.  Chronic diastolic heart failure: Appears compensated, shortness of breath likely secondary to pneumonia/COPD. continue Lasix.   History of recent CVA: Nonfocal exam, previously on Plavix, and given findings of paroxysmal A. fib/A flutter on telemetry, after discussion with neurology has been started on Eliquis. Plavix has been discontinued.   Hypertension: Controlled, continue lisinopril  Dyslipidemia: Continue statins.  Deconditioning: Secondary to acute illness, advanced age and frailty. Home health services  arranged.  Procedures/Studies: Echo: EF 40-45%, left atrium severely dilated  Discharge Diagnoses:  Principal Problem:   CAP (community acquired pneumonia) Active  Problems:   COPD (chronic obstructive pulmonary disease) (HCC)   Sepsis due to undetermined organism with acute respiratory failure (HCC)   Elevated troponin   Cerebral thrombosis with cerebral infarction   HLD (hyperlipidemia)   Essential hypertension   Chronic diastolic (congestive) heart failure (HCC)   PAF (paroxysmal atrial fibrillation) ([place])  Discharge Instructions:  Activity:  As tolerated with Full fall precautions use walker/cane & assistance as needed  Discharge Instructions    (HEART FAILURE PATIENTS) Call MD:  Anytime you have any of the following symptoms: 1) 3 pound weight gain in 24 hours or 5 pounds in 1 week 2) shortness of breath, with or without a dry hacking cough 3) swelling in the hands, feet or stomach 4) if you have to sleep on extra pillows at night in order to breathe.    Complete by:  As directed   Call MD for:  difficulty breathing, headache or visual disturbances    Complete by:  As directed   Diet - low sodium heart healthy    Complete by:  As directed   Discharge instructions    Complete by:  As directed   You had Pneumonia: Please ask your primary care practitioner to get a 2 view Chest X ray done in 4-6 weeks after hospital discharge   Follow with Primary MD  Leonard Downing, MD,  Cardiologist-Dr Nahser, and Neurologist-Dr Xu-as instructed your Hospitalist MD  Please get a complete blood count and chemistry panel checked by your Primary MD at your next visit, and again as instructed by your Primary MD.  Get Medicines reviewed and adjusted: Please take all your medications with you for your next visit with your Primary MD  Laboratory/radiological data: Please request your Primary MD to go over all hospital tests and procedure/radiological results at the follow up, please ask your  Primary MD to get all Hospital records sent to his/her office.  In some cases, they will be blood work, cultures and biopsy results pending at the time of your discharge. Please request that your primary care M.D. follows up on these results.  Also Note the following: If you experience worsening of your admission symptoms, develop shortness of breath, life threatening emergency, suicidal or homicidal thoughts you must seek medical attention immediately by calling 911 or calling your MD immediately  if symptoms less severe.  You must read complete instructions/literature along with all the possible adverse reactions/side effects for all the Medicines you take and that have been prescribed to you. Take any new Medicines after you have completely understood and accpet all the possible adverse reactions/side effects.   Do not drive when taking Pain medications or sleeping medications (Benzodaizepines)  Do not take more than prescribed Pain, Sleep and Anxiety Medications. It is not advisable to combine anxiety,sleep and pain medications without talking with your primary care practitioner  Special Instructions: If you have smoked or chewed Tobacco  in the last 2 yrs please stop smoking, stop any regular Alcohol  and or any Recreational drug use.  Wear Seat belts while driving.  Please note: You were cared for by a hospitalist during your hospital stay. Once you are discharged, your primary care physician will handle any further medical issues. Please note that NO REFILLS for any discharge medications will be authorized once you are discharged, as it is imperative that you return to your primary care physician (or establish a relationship with a primary care physician if you do not have one) for your post hospital discharge  needs so that they can reassess your need for medications and monitor your lab values.   Increase activity slowly    Complete by:  As directed       Medication List    STOP  taking these medications   aspirin 325 MG tablet   azithromycin 250 MG tablet Commonly known as:  ZITHROMAX   clopidogrel 75 MG tablet Commonly known as:  PLAVIX     TAKE these medications   acetaminophen 500 MG tablet Commonly known as:  TYLENOL Take 1,000 mg by mouth every 6 (six) hours as needed for mild pain. Reported on 07/14/2015   albuterol 108 (90 Base) MCG/ACT inhaler Commonly known as:  PROVENTIL HFA;VENTOLIN HFA Inhale 2 puffs into the lungs every 6 (six) hours as needed for wheezing.   apixaban 5 MG Tabs tablet Commonly known as:  ELIQUIS Take 1 tablet (5 mg total) by mouth 2 (two) times daily.   atorvastatin 40 MG tablet Commonly known as:  LIPITOR Take 1 tablet (40 mg total) by mouth daily at 6 PM.   furosemide 20 MG tablet Commonly known as:  LASIX Take 1 tablet (20 mg total) by mouth daily. Start taking on:  08/29/2015   levofloxacin 750 MG tablet Commonly known as:  LEVAQUIN Take 1 tablet (750 mg total) by mouth daily. Start taking on:  08/29/2015   lisinopril 20 MG tablet Commonly known as:  PRINIVIL,ZESTRIL Take 1 tablet (20 mg total) by mouth daily.   magnesium hydroxide 400 MG/5ML suspension Commonly known as:  MILK OF MAGNESIA Take by mouth daily as needed for mild constipation.   polyvinyl alcohol 1.4 % ophthalmic solution Commonly known as:  LIQUIFILM TEARS Place 1 drop into both eyes as needed for dry eyes.   SYMBICORT IN Inhale 2 puffs into the lungs 2 (two) times daily.      Follow-up Information    Leonard Downing, MD. Schedule an appointment as soon as possible for a visit in 2 week(s).   Specialty:  Family Medicine Contact information: [place] [address] [phone]        Mertie Moores, MD. Schedule an appointment as soon as possible for a visit in 1 month(s).   Specialty:  Cardiology Contact information: [address] [place] [address] [phone]        Xu,Jindong,  MD. Schedule an appointment as soon as possible for a visit in 1 month(s).   Specialty:  Neurology Contact information: [address] [place] [address] [phone]          Allergies  Allergen Reactions  . Indomethacin Anaphylaxis and Other (See Comments)    "took it fine for awhile; one day I stopped breathing and I ended up in the hospital" (01/02/2012) Pt has tolerated aspirin      Consultations:   None -telephone and then curbside consultation and chart review with neurology-Dr. Erlinda Hong   Other Procedures/Studies: Dg Chest 2 View  Result Date: 08/23/2015 CLINICAL DATA:  Patient with productive cough and fever. EXAM: CHEST  2 VIEW COMPARISON:  Chest radiograph 04/11/2015 FINDINGS: Stable cardiomegaly. Interval development of peripheral consolidation within the left mid lung. No pleural effusion or pneumothorax. Right shoulder joint degenerative changes. Thoracic spine degenerative changes. Multiple calcified thyroid nodules. IMPRESSION: New peripheral consolidation within the left mid lung concerning for pneumonia. Followup PA and lateral chest X-ray is recommended in 3-4 weeks following trial of antibiotic therapy to ensure resolution and exclude underlying malignancy. Electronically  Signed   By: Lovey Newcomer M.D.   On: 08/23/2015 21:18      TODAY-DAY OF DISCHARGE:  Subjective:   Kindyl Barsch today has no headache,no chest abdominal pain,no new weakness tingling or numbness, feels much better wants to go home today. Still coughing, shortness of breath at baseline-but looks a whole lot better than yesterday.  Objective:   Blood pressure (!) 177/76, pulse 78, temperature 98.5 F (36.9 C), temperature source Oral, resp. rate 20, height 5\' 1"  (1.549 m), weight 70.2 kg (154 lb 11.2 oz), SpO2 94 %.  Intake/Output Summary (Last 24 hours) at 08/28/15 0902 Last data filed at 08/28/15 0857  Gross per 24 hour  Intake              680 ml  Output             1550 ml   Net             -870 ml   Filed Weights   08/26/15 0433 08/27/15 0611 08/28/15 0550  Weight: 70.7 kg (155 lb 12.8 oz) 70.6 kg (155 lb 11.2 oz) 70.2 kg (154 lb 11.2 oz)    Exam: Awake Alert, Oriented *3, No new F.N deficits, Normal affect [place].AT,PERRAL Supple Neck,No JVD, No cervical lymphadenopathy appriciated.  Symmetrical Chest wall movement, Good air movement bilaterally, CTAB RRR,No Gallops,Rubs or new Murmurs, No Parasternal Heave +ve B.Sounds, Abd Soft, Non tender, No organomegaly appriciated, No rebound -guarding or rigidity. No Cyanosis, Clubbing or edema, No new Rash or bruise   PERTINENT RADIOLOGIC STUDIES: Dg Chest 2 View  Result Date: 08/23/2015 CLINICAL DATA:  Patient with productive cough and fever. EXAM: CHEST  2 VIEW COMPARISON:  Chest radiograph 04/11/2015 FINDINGS: Stable cardiomegaly. Interval development of peripheral consolidation within the left mid lung. No pleural effusion or pneumothorax. Right shoulder joint degenerative changes. Thoracic spine degenerative changes. Multiple calcified thyroid nodules. IMPRESSION: New peripheral consolidation within the left mid lung concerning for pneumonia. Followup PA and lateral chest X-ray is recommended in 3-4 weeks following trial of antibiotic therapy to ensure resolution and exclude underlying malignancy. Electronically Signed   By: Lovey Newcomer M.D.   On: 08/23/2015 21:18     PERTINENT LAB RESULTS: CBC: No results for input(s): WBC, HGB, HCT, PLT in the last 72 hours. CMET CMP     Component Value Date/Time   NA 136 08/27/2015 1345   NA 144 09/02/2014 1245   K 3.8 08/27/2015 1345   K 4.2 09/02/2014 1245   CL 103 08/27/2015 1345   CO2 25 08/27/2015 1345   CO2 29 09/02/2014 1245   GLUCOSE 157 (H) 08/27/2015 1345   GLUCOSE 95 09/02/2014 1245   BUN 14 08/27/2015 1345   BUN 21.8 09/02/2014 1245   CREATININE 0.87 08/27/2015 1345   CREATININE 0.75 05/09/2015 1157   CREATININE 1.1 09/02/2014 1245   CALCIUM 8.3  (L) 08/27/2015 1345   CALCIUM 9.4 09/02/2014 1245   PROT 6.6 04/11/2015 0950   PROT 7.2 09/02/2014 1245   ALBUMIN 3.7 04/11/2015 0950   ALBUMIN 3.9 09/02/2014 1245   AST 18 04/11/2015 0950   AST 15 09/02/2014 1245   ALT 12 (L) 04/11/2015 0950   ALT 10 09/02/2014 1245   ALKPHOS 61 04/11/2015 0950   ALKPHOS 86 09/02/2014 1245   BILITOT 0.8 04/11/2015 0950   BILITOT 0.52 09/02/2014 1245   GFRNONAA 58 (L) 08/27/2015 1345   GFRAA >60 08/27/2015 1345    GFR Estimated Creatinine Clearance: 40.1 mL/min (by  C-G formula based on SCr of 0.87 mg/dL). No results for input(s): LIPASE, AMYLASE in the last 72 hours. No results for input(s): CKTOTAL, CKMB, CKMBINDEX, TROPONINI in the last 72 hours. Invalid input(s): POCBNP No results for input(s): DDIMER in the last 72 hours. No results for input(s): HGBA1C in the last 72 hours. No results for input(s): CHOL, HDL, LDLCALC, TRIG, CHOLHDL, LDLDIRECT in the last 72 hours. No results for input(s): TSH, T4TOTAL, T3FREE, THYROIDAB in the last 72 hours.  Invalid input(s): FREET3 No results for input(s): VITAMINB12, FOLATE, FERRITIN, TIBC, IRON, RETICCTPCT in the last 72 hours. Coags: No results for input(s): INR in the last 72 hours.  Invalid input(s): PT Microbiology: Recent Results (from the past 240 hour(s))  Culture, blood (Routine x 2)     Status: None (Preliminary result)   Collection Time: 08/23/15  7:45 PM  Result Value Ref Range Status   Specimen Description BLOOD LEFT HAND  Final   Special Requests IN PEDIATRIC BOTTLE 4CC  Final   Culture NO GROWTH 4 DAYS  Final   Report Status PENDING  Incomplete  Culture, blood (Routine x 2)     Status: None (Preliminary result)   Collection Time: 08/23/15  7:55 PM  Result Value Ref Range Status   Specimen Description BLOOD LEFT HAND  Final   Special Requests BOTTLES DRAWN AEROBIC AND ANAEROBIC 5CC  Final   Culture NO GROWTH 4 DAYS  Final   Report Status PENDING  Incomplete  MRSA PCR Screening      Status: None   Collection Time: 08/24/15  2:27 AM  Result Value Ref Range Status   MRSA by PCR NEGATIVE NEGATIVE Final    Comment:        The GeneXpert MRSA Assay (FDA approved for NASAL specimens only), is one component of a comprehensive MRSA colonization surveillance program. It is not intended to diagnose MRSA infection nor to guide or monitor treatment for MRSA infections.   Respiratory Panel by PCR     Status: None   Collection Time: 08/24/15  3:47 AM  Result Value Ref Range Status   Adenovirus NOT DETECTED NOT DETECTED Final   Coronavirus 229E NOT DETECTED NOT DETECTED Final   Coronavirus HKU1 NOT DETECTED NOT DETECTED Final   Coronavirus NL63 NOT DETECTED NOT DETECTED Final   Coronavirus OC43 NOT DETECTED NOT DETECTED Final   Metapneumovirus NOT DETECTED NOT DETECTED Final   Rhinovirus / Enterovirus NOT DETECTED NOT DETECTED Final   Influenza A NOT DETECTED NOT DETECTED Final   Influenza B NOT DETECTED NOT DETECTED Final   Parainfluenza Virus 1 NOT DETECTED NOT DETECTED Final   Parainfluenza Virus 2 NOT DETECTED NOT DETECTED Final   Parainfluenza Virus 3 NOT DETECTED NOT DETECTED Final   Parainfluenza Virus 4 NOT DETECTED NOT DETECTED Final   Respiratory Syncytial Virus NOT DETECTED NOT DETECTED Final   Bordetella pertussis NOT DETECTED NOT DETECTED Final   Chlamydophila pneumoniae NOT DETECTED NOT DETECTED Final   Mycoplasma pneumoniae NOT DETECTED NOT DETECTED Final    FURTHER DISCHARGE INSTRUCTIONS:  Get Medicines reviewed and adjusted: Please take all your medications with you for your next visit with your Primary MD  Laboratory/radiological data: Please request your Primary MD to go over all hospital tests and procedure/radiological results at the follow up, please ask your Primary MD to get all Hospital records sent to his/her office.  In some cases, they will be blood work, cultures and biopsy results pending at the time of your discharge. Please  request  that your primary care M.D. goes through all the records of your hospital data and follows up on these results.  Also Note the following: If you experience worsening of your admission symptoms, develop shortness of breath, life threatening emergency, suicidal or homicidal thoughts you must seek medical attention immediately by calling 911 or calling your MD immediately  if symptoms less severe.  You must read complete instructions/literature along with all the possible adverse reactions/side effects for all the Medicines you take and that have been prescribed to you. Take any new Medicines after you have completely understood and accpet all the possible adverse reactions/side effects.   Do not drive when taking Pain medications or sleeping medications (Benzodaizepines)  Do not take more than prescribed Pain, Sleep and Anxiety Medications. It is not advisable to combine anxiety,sleep and pain medications without talking with your primary care practitioner  Special Instructions: If you have smoked or chewed Tobacco  in the last 2 yrs please stop smoking, stop any regular Alcohol  and or any Recreational drug use.  Wear Seat belts while driving.  Please note: You were cared for by a hospitalist during your hospital stay. Once you are discharged, your primary care physician will handle any further medical issues. Please note that NO REFILLS for any discharge medications will be authorized once you are discharged, as it is imperative that you return to your primary care physician (or establish a relationship with a primary care physician if you do not have one) for your post hospital discharge needs so that they can reassess your need for medications and monitor your lab values.  Total Time spent coordinating discharge including counseling, education and face to face time equals  45 minutes.  SignedOren Binet 08/28/2015 9:02 AM

## 2015-09-15 ENCOUNTER — Ambulatory Visit
Admission: RE | Admit: 2015-09-15 | Discharge: 2015-09-15 | Disposition: A | Discharge status: Home / Self Care | Payer: Medicare Other | Source: Ambulatory Visit | Attending: Family Medicine | Admitting: Family Medicine

## 2015-09-15 ENCOUNTER — Other Ambulatory Visit: Payer: Self-pay | Admitting: Family Medicine

## 2015-09-15 DIAGNOSIS — J45909 Unspecified asthma, uncomplicated: Secondary | ICD-10-CM

## 2015-09-15 DIAGNOSIS — J449 Chronic obstructive pulmonary disease, unspecified: Secondary | ICD-10-CM

## 2015-10-10 ENCOUNTER — Other Ambulatory Visit: Payer: Self-pay | Admitting: Family Medicine

## 2015-10-10 ENCOUNTER — Ambulatory Visit
Admission: RE | Admit: 2015-10-10 | Discharge: 2015-10-10 | Disposition: A | Discharge status: Home / Self Care | Payer: Medicare Other | Source: Ambulatory Visit | Attending: Family Medicine | Admitting: Family Medicine

## 2015-10-10 DIAGNOSIS — B59 Pneumocystosis: Secondary | ICD-10-CM

## 2015-10-17 ENCOUNTER — Ambulatory Visit (INDEPENDENT_AMBULATORY_CARE_PROVIDER_SITE_OTHER): Payer: Medicare Other | Admitting: Neurology

## 2015-10-17 ENCOUNTER — Encounter: Payer: Self-pay | Admitting: Neurology

## 2015-10-17 VITALS — BP 134/70 | HR 60 | Ht 63.0 in | Wt 157.2 lb

## 2015-10-17 DIAGNOSIS — E785 Hyperlipidemia, unspecified: Secondary | ICD-10-CM | POA: Diagnosis not present

## 2015-10-17 DIAGNOSIS — I10 Essential (primary) hypertension: Secondary | ICD-10-CM

## 2015-10-17 DIAGNOSIS — I63412 Cerebral infarction due to embolism of left middle cerebral artery: Secondary | ICD-10-CM | POA: Diagnosis not present

## 2015-10-17 DIAGNOSIS — I480 Paroxysmal atrial fibrillation: Secondary | ICD-10-CM | POA: Diagnosis not present

## 2015-10-17 NOTE — Progress Notes (Signed)
STROKE NEUROLOGY FOLLOW UP NOTE  NAME: [name] DOB: 12-09-1925  REASON FOR VISIT: stroke follow up HISTORY FROM: pt and daughter and chart  Today we had the pleasure of seeing [name] in follow-up at our Neurology Clinic. Pt was accompanied by daughter.   History Summary [name] is a 82 y.o. female with history of HTN, metastatic squamous cell carcinoma s/p radiation, and right shoulder surgery admitted on 04/11/15 for right hand weakness. MRI showed acute left pre-central gyrus small infarct as well as old right MCA/PCA infarct. Pt denies any previous stroke. MRA, CUS, TTE, and EEG unremarkable. TEE not able to tolerate. LDL 110 and A1C 5.7. She was discharged with plavix and lipitor. Recommended 30 day cardiac monitoring and consider RESPECT ESUS trial as outpt.  06/15/15 follow up - the patient has been doing well. Her right hand weakness much improved, still has mild dexterity difficulty but largely back to baseline. She had 30 day cardiac event monitoring and showed no afib. She had metastatic squamous cell carcinoma and finished radiation 09/2013, so far still following with oncology. As per daughter, cancer stable but not cured yet. BP 137/64.     Interval History During the interval time, she was admitted in 08/2015 for pneumonia. During admission, she was found to have 2 episode of Afib on tele. Due to the embolic stroke history, with new findings of eliquis, she was put on eliquis 5mg  bid. She took one month of eliquis but currently waiting for approval for further eliquis from insurance company. She is now taking Xarelto instead. No bleeding side effects. BP today 134/70.   REVIEW OF SYSTEMS: Full 14 system review of systems performed and notable only for those listed below and in HPI above, all others are negative:  Constitutional:   Cardiovascular:  Ear/Nose/Throat:   Skin:  Eyes:   Respiratory:   Gastroitestinal:   Genitourinary:  Hematology/Lymphatic:     Endocrine:  Musculoskeletal:  Joint pain Allergy/Immunology:   Neurological:  Psychiatric:  Sleep:   The following represents the patient's updated allergies and side effects list: Allergies  Allergen Reactions  . Indomethacin Anaphylaxis and Other (See Comments)    "took it fine for awhile; one day I stopped breathing and I ended up in the hospital" (01/02/2012) Pt has tolerated aspirin    The neurologically relevant items on the patient's problem list were reviewed on today's visit.  Neurologic Examination  A problem focused neurological exam (12 or more points of the single system neurologic examination, vital signs counts as 1 point, cranial nerves count for 8 points) was performed.  Blood pressure 134/70, pulse 60, height 5\' 3"  (1.6 m), weight 157 lb 3.2 oz (71.3 kg).  General - Well nourished, well developed, in mild respiratory distress, but off portable O2 today.  Ophthalmologic - fundi not visualized due to mild distress.  Cardiovascular - Regular rate and rhythm.  Mental Status -  Level of arousal and orientation to time, place, and person were intact. Language including expression, naming, repetition, comprehension was assessed and found intact. Fund of Knowledge was assessed and was intact.  Cranial Nerves II - XII - II - Visual field intact OU. III, IV, VI - Extraocular movements intact. V - Facial sensation intact bilaterally. VII - Facial movement intact bilaterally. VIII - Hearing & vestibular intact bilaterally. X - Palate elevates symmetrically. XI - Chin turning & shoulder shrug intact bilaterally. XII - Tongue protrusion intact  Motor Strength - The patient's  strength was normal in all extremities except right shoulder limited ROM due to previous surgery and pronator drift was absent. Bulk was normal and fasciculations were absent.  Motor Tone - Muscle tone was assessed at the neck and appendages and was normal.  Reflexes - The patient's reflexes  were symmetrical in all extremities and she had no pathological reflexes.  Sensory - Light touch, temperature/pinprick were assessed and were symmetrical.   Coordination - The patient had normal movements in the hands and feet with no ataxia or dysmetria. Tremor was absent.  Gait and Station -  Walk with walker, stooped posturing, slow but steady.   Data reviewed: I personally reviewed the images and agree with the radiology interpretations.  Dg Chest 2 View  04/11/2015 IMPRESSION: No acute abnormalities. Emphysema.   Ct Head Wo Contrast  04/11/2015 IMPRESSION: Old right occipital infarct, stable since 2014. No acute intracranial abnormality. Atrophy, chronic microvascular disease.   Mr Brain Wo Contrast  04/12/2015 ADDENDUM REPORT: 04/12/2015 12:40 ADDENDUM: We were asked by Neurology to review asymmetry of signal on diffusion sequences in the LEFT precentral cortex with regard to the patient's acute RIGHT hand weakness. Low-level increased signal on DWI axial appears facilitated on ADC in the cortex, but could be mildly restricted in the subcortical white matter. Correlate clinically for an acute versus subacute insult. Electronically Signed By: Staci Righter M.D. On: 04/12/2015 12:40  04/12/2015 IMPRESSION: Atrophy and small vessel disease. No acute intracranial findings. No significant intracranial large vessel stenosis or occlusion. Electronically Signed: By: Staci Righter M.D. On: 04/11/2015 13:57   Carotid Doppler Bilateral: 1-39% ICA stenosis. Vertebral artery flow is antegrade.  2D Echocardiogram - Compared to a prior study in 2016, the LVEF is improved to  55-60%, there is diastolic dysfunction with elevated LV filling  pressure and moderate pulmonary hypertension with RVSP of 55  mmHg.  EEG - This normal EEG is recorded in the waking and sleep state. There was no seizure or seizure predisposition recorded on this study. Please note that a normal EEG does not  preclude the possibility of epilepsy.   TEE - attempted, not tolerating, aborted  30 day cardiac event monitoring - no afib  Component     Latest Ref Rng 04/11/2015  Cholesterol     0 - 200 mg/dL 176  Triglycerides     <150 mg/dL 130  HDL Cholesterol     >40 mg/dL 40 (L)  Total CHOL/HDL Ratio      4.4  VLDL     0 - 40 mg/dL 26  LDL (calc)     0 - 99 mg/dL 110 (H)  Hemoglobin A1C     4.8 - 5.6 % 5.7 (H)  Mean Plasma Glucose      117  TSH     0.350 - 4.500 uIU/mL 0.665  T4,Free(Direct)     0.61 - 1.12 ng/dL 0.72  Triiodothyronine,Free,Serum     2.0 - 4.4 pg/mL 2.6    Assessment: As you may recall, she is a 82 y.o. African American female with PMH of HTN, metastatic squamous cell carcinoma s/p radiation, and right shoulder surgery admitted on 04/11/15 for right hand weakness. MRI showed acute left pre-central gyrus small infarct as well as old right MCA/PCA infarct. Pt denies any previous stroke. MRA, CUS, TTE, and EEG unremarkable. TEE not able to tolerate. LDL 110 and A1C 5.7. She was discharged with plavix and lipitor. 30 day cardiac monitoring showed no afib and she is not RESPECT  ESUS candidate due to metastatic squamous cell carcinoma. During the interval time, her right hand weakness resolved. She was admitted for pneumonia in 08/23/15 but found to have 2 episodes of afib, started on eliquis 5mg  bid. plavix discontinued. Currently waiting for further approval of eliquis from insurance. Now on Xarelto as bridge.    Plan:  - continue either eliquis or Xarelto as well as lipitor for stroke prevention - check BP at home and record  - Follow up with your primary care physician for stroke risk factor modification. Recommend maintain blood pressure goal <130/80, diabetes with hemoglobin A1c goal below 6.5% and lipids with LDL cholesterol goal below 70 mg/dL.  - continue follow up with cardiology Dr. Acie Fredrickson and oncology - self exercise at home - follow up in 6 months.   I spent  more than 25 minutes of face to face time with the patient. Greater than 50% of time was spent in counseling and coordination of care. We discussed stroke risk factor modification, anticoagulation medication use and bleeding precautions.   No orders of the defined types were placed in this encounter.   No orders of the defined types were placed in this encounter.   Patient Instructions  - continue either eliquis or Xarelto as well as lipitor for stroke prevention - check BP at home and record  - Follow up with your primary care physician for stroke risk factor modification. Recommend maintain blood pressure goal <130/80, diabetes with hemoglobin A1c goal below 6.5% and lipids with LDL cholesterol goal below 70 mg/dL.  - continue follow up with cardiology Dr. Acie Fredrickson and oncology - self exercise at home - follow up in 6 months.    Rosalin Hawking, MD PhD [facility] [address] [place], [place] [address] [phone]

## 2015-10-17 NOTE — Patient Instructions (Addendum)
-   continue either eliquis or Xarelto as well as lipitor for stroke prevention - check BP at home and record  - Follow up with your primary care physician for stroke risk factor modification. Recommend maintain blood pressure goal <130/80, diabetes with hemoglobin A1c goal below 6.5% and lipids with LDL cholesterol goal below 70 mg/dL.  - continue follow up with cardiology Dr. Acie Fredrickson and oncology - self exercise at home - follow up in 6 months.

## 2015-11-08 ENCOUNTER — Ambulatory Visit
Admission: RE | Admit: 2015-11-08 | Discharge: 2015-11-08 | Disposition: A | Discharge status: Home / Self Care | Payer: Medicare Other | Source: Ambulatory Visit | Attending: Family Medicine | Admitting: Family Medicine

## 2015-11-08 ENCOUNTER — Other Ambulatory Visit: Payer: Self-pay | Admitting: Family Medicine

## 2015-11-08 DIAGNOSIS — Z09 Encounter for follow-up examination after completed treatment for conditions other than malignant neoplasm: Secondary | ICD-10-CM

## 2015-11-28 ENCOUNTER — Encounter: Payer: Self-pay | Admitting: Cardiovascular Disease

## 2015-11-28 ENCOUNTER — Encounter: Payer: Self-pay | Admitting: Nurse Practitioner

## 2015-11-28 ENCOUNTER — Ambulatory Visit (INDEPENDENT_AMBULATORY_CARE_PROVIDER_SITE_OTHER): Payer: Medicare Other | Admitting: Cardiovascular Disease

## 2015-11-28 VITALS — BP 110/70 | HR 70 | Ht 63.0 in | Wt 157.4 lb

## 2015-11-28 DIAGNOSIS — I480 Paroxysmal atrial fibrillation: Secondary | ICD-10-CM

## 2015-11-28 DIAGNOSIS — I5033 Acute on chronic diastolic (congestive) heart failure: Secondary | ICD-10-CM | POA: Diagnosis not present

## 2015-11-28 DIAGNOSIS — I10 Essential (primary) hypertension: Secondary | ICD-10-CM | POA: Diagnosis not present

## 2015-11-28 DIAGNOSIS — I63412 Cerebral infarction due to embolism of left middle cerebral artery: Secondary | ICD-10-CM | POA: Diagnosis not present

## 2015-11-28 NOTE — Progress Notes (Signed)
Cardiology Office Note   Date:  11/28/2015   ID:  [name], DOB Jan 25, 1925, MRN [id number]  PCP:  Leonard Downing, MD  Cardiologist:   Mertie Moores, MD   Chief Complaint  Patient presents with  . Hypertension   1. HTN 2. CHF 3. Hypothyroidism 4. diverticulitis    [name] is an 82 yo with hx diverticulitis with diverticulosis in April. She had a perforated diverticulum and became septic. She had emergent surgery with a colostomy placement. She now scheduled to have reversal of her colostomy and the surgeons are requesting cardiac clearance. When she was hospitalized she had an echocardiogram that revealed mildly depressed left ventricular systolic function with an ejection fraction of 40-45%. She did have some diastolic dysfunction.  Left ventricle: The cavity size was moderately dilated. Wall thickness was normal. Systolic function was mildly to moderately reduced. The estimated ejection fraction was in the range of 40% to 45%. There was an increased relative contribution of atrial contraction to ventricular filling. - Left atrium: The atrium was mildly dilated  He has not had any cardiac problems since I last saw her 7 years ago. She does all of her housework without any significant problems. She's able to calm 1 flight of stairs without difficulty. She thinks that she would become short of breath if I asked her to climb 2 flights of stairs.  April, 2015:  She is doing well. She tolerated her colostomy reversal last year without problems. Able to do all of her normal activities without any chest pain or shortness breath. She still able to go up steps although she comments that she climbs slowly.   [name] 12, 2016  [name] is a 82 y.o. female who presents for follow-up of her mild congestive heart failure. She was diagnosed with squamous cell carcinoma of unknown primary. She was hospitalized in Feb, 2016 with pneumonia   Breathing is ok.  A little  short of breath  Her BP readings at physical therapy are all in the normal range. She did not take her meds this am and the BP is a bit high  April 20, 2015:  [name] was hospitalized with a stroke. She was scheduled for TEE but we were unable to pass the scope. After much effort it was determined that we would likely need general anesthesia. I made the determination given her age and the fact that a transesophageal echo would probably not change our management much that we would cancel the transesophageal echo. The plan was to place a 30 day event monitor for further evaluation to look for atrial fibrillation.  May 27, 2015:  Ms.  [name] is seen back today . Her 30 day monitor showed no atrial fib.  She is now on a lower dose of lasix  Is having some shortness of breath  - walked from the waiting room to the exam room .  No CP .    Is supposed to be using home O2 but does not have a portable tank that holds oxygen.   Nov. 27, 2017:  [name] is seen today .   Was in the hospital with COPD. Had a brief run of atrial fib. Was started on Eliquis.  Plavix was stopped.   Has had some ankle swelling  Took extra lasix for a few days  which helped.  BP is a bit elevated today.   Past Medical History:  Diagnosis Date  . Anemia    years ago  .  Arthritis    "right shoulder" (01/02/2012)  . Bladder mass   . CAP (community acquired pneumonia) 01/02/2012   Lower lobe   . CHF (congestive heart failure) ([place])   . COPD (chronic obstructive pulmonary disease) ([place])   . Difficulty sleeping   . Diverticulitis of colon with perforation 01/14/2012   That is post sigmoid resection with [facility] pouch and end colostomy   . GERD (gastroesophageal reflux disease)    occasional  . Goiter    radioactive iodine ablation/notes 01/02/2012  . History of kidney stones    X 1  . Hypertension   . Hypothyroidism    "I have taken Synthroid before" (01/02/2012)   HAS HAD RADIOACTIVE IODINE TX 2 YR S AGO  .  Intra-abdominal abscess ([place]) 01/14/2012  . Pneumatosis of intestines 01/14/2012  . Pneumonia 01/02/2012; ? 2009  . Radiation 08/10/13-09/15/13   57 gray to vaginal/bladder mass  . SOB (shortness of breath)    'all the time" (01/02/2012)  . Stroke ([place])   . UTI (lower urinary tract infection) 01/03/2012    Past Surgical History:  Procedure Laterality Date  . ABDOMINAL HYSTERECTOMY  1980's  . APPENDECTOMY  1980's   "when they did hysterectomy" (01/02/2012)  . CATARACT EXTRACTION W/ INTRAOCULAR LENS  IMPLANT, BILATERAL  ?1990's   Bil  . COLON SURGERY    . COLOSTOMY N/A 03/12/2012   Procedure: COLOSTOMY;  Surgeon: Ralene Ok, MD;  Location: [place];  Service: General;  Laterality: N/A;  . COLOSTOMY REVISION N/A 03/12/2012   Procedure: COLON RESECTION SIGMOID ;  Surgeon: Ralene Ok, MD;  Location: [place];  Service: General;  Laterality: N/A;  . COLOSTOMY TAKEDOWN N/A 10/21/2012   Procedure: LAPAROSCOPIC COLOSTOMY TAKEDOWN AND HARTMANS ANASTOMOSIS, rigid proctoscopy;  Surgeon: Ralene Ok, MD;  Location: [facility];  Service: General;  Laterality: N/A;  . EYE SURGERY    . LYSIS OF ADHESION N/A 10/21/2012   Procedure: LYSIS OF ADHESION;  Surgeon: Ralene Ok, MD;  Location: [facility];  Service: General;  Laterality: N/A;  . TRANSURETHRAL RESECTION OF BLADDER TUMOR WITH GYRUS (TURBT-GYRUS) N/A 06/30/2013   Procedure: TRANSURETHRAL RESECTION OF BLADDER TUMOR WITH GYRUS (TURBT-GYRUS)/VAGINAL BIOPSY;  Surgeon: Ardis Hughs, MD;  Location: [facility];  Service: Urology;  Laterality: N/A;     Current Outpatient Prescriptions  Medication Sig Dispense Refill  . acetaminophen (TYLENOL) 500 MG tablet Take 1,000 mg by mouth every 6 (six) hours as needed for mild pain. Reported on 07/14/2015    . albuterol (PROVENTIL HFA;VENTOLIN HFA) 108 (90 BASE) MCG/ACT inhaler Inhale 2 puffs into the lungs every 6 (six) hours as needed for wheezing.    Marland Kitchen apixaban (ELIQUIS) 5 MG TABS tablet Take 1 tablet (5 mg total)  by mouth 2 (two) times daily. 120 tablet 0  . atorvastatin (LIPITOR) 40 MG tablet Take 1 tablet (40 mg total) by mouth daily at 6 PM. 30 tablet 11  . Budesonide-Formoterol Fumarate (SYMBICORT IN) Inhale 2 puffs into the lungs 2 (two) times daily.    . furosemide (LASIX) 20 MG tablet Take 1 tablet (20 mg total) by mouth daily.    Marland Kitchen lisinopril (PRINIVIL,ZESTRIL) 20 MG tablet Take 1 tablet (20 mg total) by mouth daily. 30 tablet 11  . magnesium hydroxide (MILK OF MAGNESIA) 400 MG/5ML suspension Take by mouth daily as needed for mild constipation.    . polyvinyl alcohol (LIQUIFILM TEARS) 1.4 % ophthalmic solution Place 1 drop into both eyes as needed for dry eyes.  No current facility-administered medications for this visit.     Allergies:   Indomethacin    Social History:  The patient  reports that she quit smoking about 24 years ago. Her smoking use included Cigarettes. She has a 4.20 pack-year smoking history. She has never used smokeless tobacco. She reports that she does not drink alcohol or use drugs.   Family History:  The patient's family history includes Cancer in her sister; Heart disease in her mother; Stroke in her sister.    ROS:  Please see the history of present illness.    Review of Systems: Constitutional:  denies fever, chills, diaphoresis, appetite change and fatigue.  HEENT: denies photophobia, eye pain, redness, hearing loss, ear pain, congestion, sore throat, rhinorrhea, sneezing, neck pain, neck stiffness and tinnitus.  Respiratory: denies SOB, DOE, cough, chest tightness, and wheezing.  Cardiovascular: denies chest pain, palpitations and leg swelling.  Gastrointestinal: denies nausea, vomiting, abdominal pain, diarrhea, constipation, blood in stool.  Genitourinary: denies dysuria, urgency, frequency, hematuria, flank pain and difficulty urinating.  Musculoskeletal: denies  myalgias, back pain, joint swelling, arthralgias and gait problem.   Skin: denies pallor,  rash and wound.  Neurological: denies dizziness, seizures, syncope, weakness, light-headedness, numbness and headaches.   Hematological: denies adenopathy, easy bruising, personal or family bleeding history.  Psychiatric/ Behavioral: denies suicidal ideation, mood changes, confusion, nervousness, sleep disturbance and agitation.       All other systems are reviewed and negative.    PHYSICAL EXAM: VS:  BP 110/70   Pulse 70   Ht 5\' 3"  (1.6 m)   Wt 157 lb 6.4 oz (71.4 kg)   BMI 27.88 kg/m  , BMI Body mass index is 27.88 kg/m. GEN: Well nourished, well developed, in no acute distress  HEENT:  + goiter, R> L  Neck: no JVD, carotid bruits, or masses Cardiac: RRR; no murmurs, rubs, or gallops,no edema  Respiratory:  clear to auscultation bilaterally, normal work of breathing GI: soft, nontender, nondistended, + BS MS: no deformity or atrophy  Skin: warm and dry, no rash Neuro:  Strength and sensation are intact Psych: normal   EKG:  EKG is ordered today. NSR with frequent PVCs , NS IVCD    Recent Labs: 04/11/2015: ALT 12; TSH 0.665 08/24/2015: B Natriuretic Peptide 479.9; Magnesium 2.1 08/25/2015: Hemoglobin 9.5; Platelets 312 08/27/2015: BUN 14; Creatinine, Ser 0.87; Potassium 3.8; Sodium 136    Lipid Panel    Component Value Date/Time   CHOL 96 08/24/2015 0247   TRIG 50 08/24/2015 0247   HDL 38 (L) 08/24/2015 0247   CHOLHDL 2.5 08/24/2015 0247   VLDL 10 08/24/2015 0247   LDLCALC 48 08/24/2015 0247      Wt Readings from Last 3 Encounters:  11/28/15 157 lb 6.4 oz (71.4 kg)  10/17/15 157 lb 3.2 oz (71.3 kg)  08/28/15 154 lb 11.2 oz (70.2 kg)      Other studies Reviewed: Additional studies/ records that were reviewed today include: . Review of the above records demonstrates:    ASSESSMENT AND PLAN:  1. HTN - BP is well controlled.         2. CHF - seems to be well tolerated.  3. Hypothyroidism 4. diverticulitis 5. Atrial fib - was found to have PAF.   This  is likely the cause of her cryptogenic stroke.   Is now on Eliquis.   Off plavix    Current medicines are reviewed at length with the patient today.  The patient does not  have concerns regarding medicines.  The following changes have been made:  no change  Labs/ tests ordered today include:  No orders of the defined types were placed in this encounter.   Disposition:   FU with me in 6 months  Mertie Moores, MD  11/28/2015 2:14 PM    [facility] [address], [place], [place]  [address] Phone: [phone]; Fax: [phone]

## 2015-11-28 NOTE — Patient Instructions (Signed)

## 2015-12-15 ENCOUNTER — Observation Stay (HOSPITAL_BASED_OUTPATIENT_CLINIC_OR_DEPARTMENT_OTHER): Payer: Medicare Other

## 2015-12-15 ENCOUNTER — Emergency Department (HOSPITAL_COMMUNITY): Payer: Medicare Other

## 2015-12-15 ENCOUNTER — Inpatient Hospital Stay (HOSPITAL_COMMUNITY)
Admission: EM | Admit: 2015-12-15 | Discharge: 2015-12-17 | DRG: 292 | Disposition: A | Discharge status: Home / Self Care | Payer: Medicare Other | Attending: Internal Medicine | Admitting: Internal Medicine

## 2015-12-15 ENCOUNTER — Encounter (HOSPITAL_COMMUNITY): Payer: Self-pay | Admitting: Emergency Medicine

## 2015-12-15 DIAGNOSIS — Z803 Family history of malignant neoplasm of breast: Secondary | ICD-10-CM

## 2015-12-15 DIAGNOSIS — I5022 Chronic systolic (congestive) heart failure: Secondary | ICD-10-CM | POA: Diagnosis present

## 2015-12-15 DIAGNOSIS — R0600 Dyspnea, unspecified: Secondary | ICD-10-CM | POA: Diagnosis not present

## 2015-12-15 DIAGNOSIS — Z823 Family history of stroke: Secondary | ICD-10-CM

## 2015-12-15 DIAGNOSIS — I509 Heart failure, unspecified: Secondary | ICD-10-CM | POA: Diagnosis not present

## 2015-12-15 DIAGNOSIS — J441 Chronic obstructive pulmonary disease with (acute) exacerbation: Secondary | ICD-10-CM | POA: Diagnosis present

## 2015-12-15 DIAGNOSIS — I493 Ventricular premature depolarization: Secondary | ICD-10-CM | POA: Diagnosis present

## 2015-12-15 DIAGNOSIS — I110 Hypertensive heart disease with heart failure: Principal | ICD-10-CM | POA: Diagnosis present

## 2015-12-15 DIAGNOSIS — I10 Essential (primary) hypertension: Secondary | ICD-10-CM | POA: Diagnosis not present

## 2015-12-15 DIAGNOSIS — J449 Chronic obstructive pulmonary disease, unspecified: Secondary | ICD-10-CM | POA: Diagnosis present

## 2015-12-15 DIAGNOSIS — D649 Anemia, unspecified: Secondary | ICD-10-CM | POA: Diagnosis present

## 2015-12-15 DIAGNOSIS — Z87891 Personal history of nicotine dependence: Secondary | ICD-10-CM

## 2015-12-15 DIAGNOSIS — Z8639 Personal history of other endocrine, nutritional and metabolic disease: Secondary | ICD-10-CM

## 2015-12-15 DIAGNOSIS — J9611 Chronic respiratory failure with hypoxia: Secondary | ICD-10-CM | POA: Diagnosis not present

## 2015-12-15 DIAGNOSIS — Z8249 Family history of ischemic heart disease and other diseases of the circulatory system: Secondary | ICD-10-CM

## 2015-12-15 DIAGNOSIS — I5043 Acute on chronic combined systolic (congestive) and diastolic (congestive) heart failure: Secondary | ICD-10-CM | POA: Diagnosis present

## 2015-12-15 DIAGNOSIS — Z9981 Dependence on supplemental oxygen: Secondary | ICD-10-CM

## 2015-12-15 DIAGNOSIS — Z8673 Personal history of transient ischemic attack (TIA), and cerebral infarction without residual deficits: Secondary | ICD-10-CM

## 2015-12-15 DIAGNOSIS — Z7951 Long term (current) use of inhaled steroids: Secondary | ICD-10-CM

## 2015-12-15 DIAGNOSIS — E785 Hyperlipidemia, unspecified: Secondary | ICD-10-CM | POA: Diagnosis present

## 2015-12-15 DIAGNOSIS — R0603 Acute respiratory distress: Secondary | ICD-10-CM | POA: Diagnosis present

## 2015-12-15 DIAGNOSIS — Z79899 Other long term (current) drug therapy: Secondary | ICD-10-CM

## 2015-12-15 DIAGNOSIS — I480 Paroxysmal atrial fibrillation: Secondary | ICD-10-CM | POA: Diagnosis present

## 2015-12-15 DIAGNOSIS — J9610 Chronic respiratory failure, unspecified whether with hypoxia or hypercapnia: Secondary | ICD-10-CM | POA: Diagnosis present

## 2015-12-15 DIAGNOSIS — I5033 Acute on chronic diastolic (congestive) heart failure: Secondary | ICD-10-CM | POA: Diagnosis not present

## 2015-12-15 DIAGNOSIS — Z7901 Long term (current) use of anticoagulants: Secondary | ICD-10-CM

## 2015-12-15 DIAGNOSIS — Z87442 Personal history of urinary calculi: Secondary | ICD-10-CM

## 2015-12-15 LAB — CBC WITH DIFFERENTIAL/PLATELET
Basophils Absolute: 0 10*3/uL (ref 0.0–0.1)
Basophils Relative: 0 %
Eosinophils Absolute: 0.1 10*3/uL (ref 0.0–0.7)
Eosinophils Relative: 2 %
HEMATOCRIT: 31.4 % — AB (ref 36.0–46.0)
HEMOGLOBIN: 10.6 g/dL — AB (ref 12.0–15.0)
LYMPHS ABS: 0.4 10*3/uL — AB (ref 0.7–4.0)
LYMPHS PCT: 8 %
MCH: 33 pg (ref 26.0–34.0)
MCHC: 33.8 g/dL (ref 30.0–36.0)
MCV: 97.8 fL (ref 78.0–100.0)
Monocytes Absolute: 0.4 10*3/uL (ref 0.1–1.0)
Monocytes Relative: 7 %
NEUTROS PCT: 83 %
Neutro Abs: 4.2 10*3/uL (ref 1.7–7.7)
Platelets: 205 10*3/uL (ref 150–400)
RBC: 3.21 MIL/uL — AB (ref 3.87–5.11)
RDW: 12.9 % (ref 11.5–15.5)
WBC: 5.1 10*3/uL (ref 4.0–10.5)

## 2015-12-15 LAB — BASIC METABOLIC PANEL
Anion gap: 6 (ref 5–15)
BUN: 13 mg/dL (ref 6–20)
CHLORIDE: 107 mmol/L (ref 101–111)
CO2: 28 mmol/L (ref 22–32)
Calcium: 8.7 mg/dL — ABNORMAL LOW (ref 8.9–10.3)
Creatinine, Ser: 0.77 mg/dL (ref 0.44–1.00)
GFR calc Af Amer: >60 mL/min (ref >60)
GFR calc non Af Amer: >60 mL/min (ref >60)
GLUCOSE: 113 mg/dL — AB (ref 65–99)
POTASSIUM: 4.5 mmol/L (ref 3.5–5.1)
Sodium: 141 mmol/L (ref 135–145)

## 2015-12-15 LAB — ECHOCARDIOGRAM COMPLETE
HEIGHTINCHES: 64 in
WEIGHTICAEL: 2492.8 [oz_av]

## 2015-12-15 LAB — BRAIN NATRIURETIC PEPTIDE: B NATRIURETIC PEPTIDE 5: 529.4 pg/mL — AB (ref 0.0–100.0)

## 2015-12-15 LAB — TROPONIN I: Troponin I: <0.03 ng/mL (ref <0.03)

## 2015-12-15 MED ORDER — ONDANSETRON HCL 4 MG/2ML IJ SOLN: 4.0000 mg | Freq: Four times a day (QID) | INTRAMUSCULAR | Status: DC | PRN

## 2015-12-15 MED ORDER — MOMETASONE FURO-FORMOTEROL FUM 100-5 MCG/ACT IN AERO
2.0000 | INHALATION_SPRAY | Freq: Two times a day (BID) | RESPIRATORY_TRACT | Status: DC
  Administered 2015-12-16 – 2015-12-17 (×3): 2 via RESPIRATORY_TRACT
  Filled 2015-12-15 (×2): qty 8.8

## 2015-12-15 MED ORDER — METHYLPREDNISOLONE SODIUM SUCC 125 MG IJ SOLR
125.0000 mg | Freq: Once | INTRAMUSCULAR | Status: AC
  Administered 2015-12-15: 125 mg via INTRAVENOUS
  Filled 2015-12-15: qty 2

## 2015-12-15 MED ORDER — POTASSIUM CHLORIDE CRYS ER 20 MEQ PO TBCR
20.0000 meq | EXTENDED_RELEASE_TABLET | Freq: Every day | ORAL | Status: DC
  Administered 2015-12-16 – 2015-12-17 (×2): 20 meq via ORAL
  Filled 2015-12-15 (×2): qty 1

## 2015-12-15 MED ORDER — SODIUM CHLORIDE 0.9% FLUSH
3.0000 mL | Freq: Two times a day (BID) | INTRAVENOUS | Status: DC
  Administered 2015-12-15 – 2015-12-17 (×5): 3 mL via INTRAVENOUS

## 2015-12-15 MED ORDER — FUROSEMIDE 10 MG/ML IJ SOLN: 40.0000 mg | Freq: Once | INTRAMUSCULAR | Status: DC

## 2015-12-15 MED ORDER — BUDESONIDE-FORMOTEROL FUMARATE 80-4.5 MCG/ACT IN AERO
2.0000 | INHALATION_SPRAY | Freq: Two times a day (BID) | RESPIRATORY_TRACT | Status: DC
  Filled 2015-12-15: qty 6.9

## 2015-12-15 MED ORDER — ALBUTEROL SULFATE (2.5 MG/3ML) 0.083% IN NEBU
2.5000 mg | INHALATION_SOLUTION | Freq: Four times a day (QID) | RESPIRATORY_TRACT | Status: DC
  Administered 2015-12-16 (×3): 2.5 mg via RESPIRATORY_TRACT
  Filled 2015-12-15 (×3): qty 3

## 2015-12-15 MED ORDER — ACETAMINOPHEN 325 MG PO TABS: 650.0000 mg | ORAL_TABLET | ORAL | Status: DC | PRN

## 2015-12-15 MED ORDER — SODIUM CHLORIDE 0.9% FLUSH: 3.0000 mL | INTRAVENOUS | Status: DC | PRN

## 2015-12-15 MED ORDER — ALPRAZOLAM 0.25 MG PO TABS: 0.2500 mg | ORAL_TABLET | Freq: Two times a day (BID) | ORAL | Status: DC | PRN

## 2015-12-15 MED ORDER — IPRATROPIUM-ALBUTEROL 0.5-2.5 (3) MG/3ML IN SOLN
3.0000 mL | Freq: Once | RESPIRATORY_TRACT | Status: AC
  Administered 2015-12-15: 3 mL via RESPIRATORY_TRACT
  Filled 2015-12-15: qty 3

## 2015-12-15 MED ORDER — SODIUM CHLORIDE 0.9 % IV SOLN: 250.0000 mL | INTRAVENOUS | Status: DC | PRN

## 2015-12-15 MED ORDER — ALBUTEROL SULFATE (2.5 MG/3ML) 0.083% IN NEBU
2.5000 mg | INHALATION_SOLUTION | RESPIRATORY_TRACT | Status: DC
  Administered 2015-12-15 (×2): 2.5 mg via RESPIRATORY_TRACT
  Filled 2015-12-15 (×2): qty 3

## 2015-12-15 MED ORDER — POLYVINYL ALCOHOL 1.4 % OP SOLN
1.0000 [drp] | OPHTHALMIC | Status: DC | PRN
  Filled 2015-12-15: qty 15

## 2015-12-15 MED ORDER — ATORVASTATIN CALCIUM 40 MG PO TABS
40.0000 mg | ORAL_TABLET | Freq: Every day | ORAL | Status: DC
  Administered 2015-12-15 – 2015-12-16 (×2): 40 mg via ORAL
  Filled 2015-12-15 (×2): qty 1

## 2015-12-15 MED ORDER — METHYLPREDNISOLONE SODIUM SUCC 125 MG IJ SOLR
60.0000 mg | Freq: Two times a day (BID) | INTRAMUSCULAR | Status: DC
  Administered 2015-12-15 – 2015-12-16 (×2): 60 mg via INTRAVENOUS
  Filled 2015-12-15 (×2): qty 2

## 2015-12-15 MED ORDER — FUROSEMIDE 10 MG/ML IJ SOLN
40.0000 mg | Freq: Two times a day (BID) | INTRAMUSCULAR | Status: DC
  Administered 2015-12-15 – 2015-12-17 (×4): 40 mg via INTRAVENOUS
  Filled 2015-12-15 (×4): qty 4

## 2015-12-15 MED ORDER — LEVOFLOXACIN 500 MG PO TABS
500.0000 mg | ORAL_TABLET | Freq: Every day | ORAL | Status: DC
  Administered 2015-12-15 – 2015-12-17 (×3): 500 mg via ORAL
  Filled 2015-12-15 (×3): qty 1

## 2015-12-15 MED ORDER — LISINOPRIL 20 MG PO TABS
20.0000 mg | ORAL_TABLET | Freq: Every day | ORAL | Status: DC
  Administered 2015-12-15 – 2015-12-17 (×3): 20 mg via ORAL
  Filled 2015-12-15 (×3): qty 1

## 2015-12-15 MED ORDER — FUROSEMIDE 10 MG/ML IJ SOLN
40.0000 mg | Freq: Once | INTRAMUSCULAR | Status: AC
  Administered 2015-12-15: 40 mg via INTRAVENOUS
  Filled 2015-12-15: qty 4

## 2015-12-15 MED ORDER — APIXABAN 5 MG PO TABS
5.0000 mg | ORAL_TABLET | Freq: Two times a day (BID) | ORAL | Status: DC
  Administered 2015-12-15 – 2015-12-17 (×5): 5 mg via ORAL
  Filled 2015-12-15 (×5): qty 1

## 2015-12-15 NOTE — Progress Notes (Signed)
  Echocardiogram 2D Echocardiogram has been performed.  Diamond Nickel 12/15/2015, 3:21 PM

## 2015-12-15 NOTE — H&P (Signed)
History and Physical    [name] [id number] DOB: 19-Dec-1925 DOA: 12/15/2015  PCP: Leonard Downing, MD Patient coming from: home  Chief Complaint: sob  HPI: [name] is a delightful 82 y.o. female with medical history significant COPD on home oxygen at 2 L, chronic combined heart failure, paroxysmal A. fib, CVA, hypertension, previously acquired pneumonia 3 months ago requiring hospitalization since to the emergency department with a chief complaint of shortness of breath. Initial evaluation reveals respiratory distress likely related to acute on chronic CHF and COPD exacerbation.  Information is obtained from the patient. She reports a 2 day history of gradually worsening shortness of breath and dyspnea with exertion. Associated symptoms include nonproductive cough and congestion. She denies headache dizziness syncope or near-syncope. She denies chest pain palpitations lower extremity edema. She denies abdominal pain nausea vomiting diarrhea constipation melena or bright red blood paretic complete. She denies any dysuria hematuria frequency or urgency. She denies fever chills recent travel or sick contacts. She does report that she was sitting outside on her porch over the weekend after this no enjoying "the cold air". She also reports using her Symbicort and Proventil inhaler without much relief.  ED Course: Emergency department she's afebrile hemodynamically stable, tachypnea with respiration rate 28 and increased oxygen demand. She is provided with Solu-Medrol 125 mg, Lasix intravenously 40 mg and a nebulizer. At the time of admission oxygen saturation level 97% on 4 L nasal cannula  Review of Systems: As per HPI otherwise 10 point review of systems negative.   Ambulatory Status: She ambulates with a walker at home. Reports no recent falls. Is independent with ADLs  Past Medical History:  Diagnosis Date  . Anemia    years ago  . Arthritis    "right shoulder" (01/02/2012)    . Bladder mass   . CAP (community acquired pneumonia) 01/02/2012   Lower lobe   . CHF (congestive heart failure) ([place])   . COPD (chronic obstructive pulmonary disease) ([place])   . Difficulty sleeping   . Diverticulitis of colon with perforation 01/14/2012   That is post sigmoid resection with [facility] pouch and end colostomy   . GERD (gastroesophageal reflux disease)    occasional  . Goiter    radioactive iodine ablation/notes 01/02/2012  . History of kidney stones    X 1  . Hypertension   . Hypothyroidism    "I have taken Synthroid before" (01/02/2012)   HAS HAD RADIOACTIVE IODINE TX 2 YR S AGO  . Intra-abdominal abscess ([place]) 01/14/2012  . Pneumatosis of intestines 01/14/2012  . Pneumonia 01/02/2012; ? 2009  . Radiation 08/10/13-09/15/13   57 gray to vaginal/bladder mass  . SOB (shortness of breath)    'all the time" (01/02/2012)  . Stroke ([place])   . UTI (lower urinary tract infection) 01/03/2012    Past Surgical History:  Procedure Laterality Date  . ABDOMINAL HYSTERECTOMY  1980's  . APPENDECTOMY  1980's   "when they did hysterectomy" (01/02/2012)  . CATARACT EXTRACTION W/ INTRAOCULAR LENS  IMPLANT, BILATERAL  ?1990's   Bil  . COLON SURGERY    . COLOSTOMY N/A 03/12/2012   Procedure: COLOSTOMY;  Surgeon: Ralene Ok, MD;  Location: [place];  Service: General;  Laterality: N/A;  . COLOSTOMY REVISION N/A 03/12/2012   Procedure: COLON RESECTION SIGMOID ;  Surgeon: Ralene Ok, MD;  Location: [facility];  Service: General;  Laterality: N/A;  . COLOSTOMY TAKEDOWN N/A 10/21/2012   Procedure: LAPAROSCOPIC COLOSTOMY TAKEDOWN AND HARTMANS ANASTOMOSIS, rigid  proctoscopy;  Surgeon: Ralene Ok, MD;  Location: [facility];  Service: General;  Laterality: N/A;  . EYE SURGERY    . LYSIS OF ADHESION N/A 10/21/2012   Procedure: LYSIS OF ADHESION;  Surgeon: Ralene Ok, MD;  Location: [facility];  Service: General;  Laterality: N/A;  . TRANSURETHRAL RESECTION OF BLADDER TUMOR WITH GYRUS (TURBT-GYRUS) N/A  06/30/2013   Procedure: TRANSURETHRAL RESECTION OF BLADDER TUMOR WITH GYRUS (TURBT-GYRUS)/VAGINAL BIOPSY;  Surgeon: Ardis Hughs, MD;  Location: [facility];  Service: Urology;  Laterality: N/A;    Social History   Social History  . Marital status: Widowed    Spouse name: N/A  . Number of children: 6  . Years of education: N/A   Occupational History  . retired    Social History Main Topics  . Smoking status: Former Smoker    Packs/day: 0.12    Years: 35.00    Types: Cigarettes    Quit date: 01/02/1991  . Smokeless tobacco: Never Used  . Alcohol use No  . Drug use: No  . Sexual activity: No   Other Topics Concern  . Not on file   Social History Narrative  . No narrative on file    Allergies  Allergen Reactions  . Indomethacin Anaphylaxis and Other (See Comments)    "took it fine for awhile; one day I stopped breathing and I ended up in the hospital" (01/02/2012) Pt has tolerated aspirin    Family History  Problem Relation Age of Onset  . Cancer Sister     Breast  . Stroke Sister   . Heart disease Mother   . Heart disease      Prior to Admission medications   Medication Sig Start Date End Date Taking? Authorizing Provider  acetaminophen (TYLENOL) 500 MG tablet Take 1,000 mg by mouth every 6 (six) hours as needed for mild pain. Reported on 07/14/2015   Yes Historical Provider, MD  albuterol (PROVENTIL HFA;VENTOLIN HFA) 108 (90 BASE) MCG/ACT inhaler Inhale 2 puffs into the lungs every 6 (six) hours as needed for wheezing.   Yes Historical Provider, MD  apixaban (ELIQUIS) 5 MG TABS tablet Take 1 tablet (5 mg total) by mouth 2 (two) times daily. 08/28/15  Yes Shanker Kristeen Mans, MD  atorvastatin (LIPITOR) 40 MG tablet Take 1 tablet (40 mg total) by mouth daily at 6 PM. 05/27/15  Yes Thayer Headings, MD  Budesonide-Formoterol Fumarate (SYMBICORT IN) Inhale 2 puffs into the lungs 2 (two) times daily.   Yes Historical Provider, MD  cholecalciferol (VITAMIN D) 1000 units tablet  Take 1,000 Units by mouth daily.   Yes Historical Provider, MD  ferrous sulfate 325 (65 FE) MG tablet Take 325 mg by mouth daily as needed (iron supplement).   Yes Historical Provider, MD  furosemide (LASIX) 20 MG tablet Take 1 tablet (20 mg total) by mouth daily. Patient taking differently: Take 20-40 mg by mouth See admin instructions. Pt alternates 20mg  and 40mg  every other day 08/29/15  Yes Shanker Kristeen Mans, MD  lisinopril (PRINIVIL,ZESTRIL) 20 MG tablet Take 1 tablet (20 mg total) by mouth daily. 05/27/15  Yes Thayer Headings, MD  polyethylene glycol [facility] / Floria Raveling) packet Take 17 g by mouth daily.   Yes Historical Provider, MD  polyvinyl alcohol (LIQUIFILM TEARS) 1.4 % ophthalmic solution Place 1 drop into both eyes as needed for dry eyes.   Yes Historical Provider, MD  Prenatal Vit-Fe Fumarate-FA (MULTIVITAMIN-PRENATAL) 27-0.8 MG TABS tablet Take 1 tablet by mouth daily at  12 noon.   Yes Historical Provider, MD    Physical Exam: Vitals:   12/15/15 1030 12/15/15 1045 12/15/15 1130 12/15/15 1200  BP: 145/69 149/63 158/72 150/94  Pulse: (!) 59 66 69 75  Resp: 13 22 26 18   Temp:      TempSrc:      SpO2: 99% 99% 97% 97%     General:  Appears calm and comfortable, quite pleasant Eyes:  PERRL, EOMI, normal lids, iris ENT:  grossly normal hearing, lips & tongue, mucous membranes of her mouth are pink and moist Neck:  no LAD, masses or thyromegaly Cardiovascular:  RRR, Heart sounds are distant no m/r/g. No LE edema. Pedal pulses present and palpable Respiratory:  Mild to moderate increased work of breathing with conversation. Some use of abdominal accessory muscles. Breath sounds are quite diminished throughout. Faint end expiratory wheezing I hear no crackles Abdomen:  soft, ntnd, positive bowel sounds no guarding or rebounding Skin:  no rash or induration seen on limited exam Musculoskeletal:  grossly normal tone BUE/BLE, good ROM, no bony abnormality Psychiatric:  grossly normal  mood and affect, speech fluent and appropriate, AOx3 Neurologic:  CN 2-12 grossly intact, moves all extremities in coordinated fashion, sensation intact speech clear facial symmetry moves all extremities and oriented 3  Labs on Admission: I have personally reviewed following labs and imaging studies  CBC:  Recent Labs Lab 12/15/15 0947  WBC 5.1  NEUTROABS 4.2  HGB 10.6*  HCT 31.4*  MCV 97.8  PLT 99991111   Basic Metabolic Panel:  Recent Labs Lab 12/15/15 0947  NA 141  K 4.5  CL 107  CO2 28  GLUCOSE 113*  BUN 13  CREATININE 0.77  CALCIUM 8.7*   GFR: CrCl cannot be calculated (Unknown ideal weight.). Liver Function Tests: No results for input(s): AST, ALT, ALKPHOS, BILITOT, PROT, ALBUMIN in the last 168 hours. No results for input(s): LIPASE, AMYLASE in the last 168 hours. No results for input(s): AMMONIA in the last 168 hours. Coagulation Profile: No results for input(s): INR, PROTIME in the last 168 hours. Cardiac Enzymes:  Recent Labs Lab 12/15/15 0947  TROPONINI <0.03   BNP (last 3 results) No results for input(s): PROBNP in the last 8760 hours. HbA1C: No results for input(s): HGBA1C in the last 72 hours. CBG: No results for input(s): GLUCAP in the last 168 hours. Lipid Profile: No results for input(s): CHOL, HDL, LDLCALC, TRIG, CHOLHDL, LDLDIRECT in the last 72 hours. Thyroid Function Tests: No results for input(s): TSH, T4TOTAL, FREET4, T3FREE, THYROIDAB in the last 72 hours. Anemia Panel: No results for input(s): VITAMINB12, FOLATE, FERRITIN, TIBC, IRON, RETICCTPCT in the last 72 hours. Urine analysis:    Component Value Date/Time   COLORURINE AMBER (A) 08/24/2015 0450   APPEARANCEUR CLOUDY (A) 08/24/2015 0450   LABSPEC 1.027 08/24/2015 0450   LABSPEC 1.010 10/29/2013 1105   PHURINE 5.5 08/24/2015 0450   GLUCOSEU NEGATIVE 08/24/2015 0450   GLUCOSEU Negative 10/29/2013 1105   HGBUR MODERATE (A) 08/24/2015 0450   BILIRUBINUR SMALL (A) 08/24/2015  0450   BILIRUBINUR Negative 10/29/2013 1105   KETONESUR 15 (A) 08/24/2015 0450   PROTEINUR 100 (A) 08/24/2015 0450   UROBILINOGEN 1.0 02/13/2014 1137   UROBILINOGEN 0.2 10/29/2013 1105   NITRITE NEGATIVE 08/24/2015 0450   LEUKOCYTESUR MODERATE (A) 08/24/2015 0450   LEUKOCYTESUR Trace 10/29/2013 1105    Creatinine Clearance: CrCl cannot be calculated (Unknown ideal weight.).  Sepsis Labs: @LABRCNTIP (procalcitonin:4,lacticidven:4) )No results found for this or any previous visit (  from the past 240 hour(s)).   Radiological Exams on Admission: Dg Chest 2 View  Result Date: 12/15/2015 CLINICAL DATA:  Increasing shortness of breath and congestion. EXAM: CHEST  2 VIEW COMPARISON:  11/08/2015 and prior exams FINDINGS: Cardiomegaly noted. New interstitial opacities likely represent edema. Trace bilateral pleural effusions and minimal bibasilar atelectasis noted. There is no evidence of pneumothorax. No acute bony abnormalities are identified. IMPRESSION: Cardiomegaly with interstitial pulmonary edema and trace bilateral pleural effusions. Electronically Signed   By: Margarette Canada M.D.   On: 12/15/2015 11:10    EKG: Independently reviewed. Sinus rhythm with occasional Premature ventricular complexes Non-specific intra-ventricular conduction block Cannot rule out Septal infarct , age undetermined T wave abnormality, consider lateral ischemia Abnormal ECG Since last tracing rate slower Otherwise no significant change  Assessment/Plan Principal Problem:   Respiratory distress Active Problems:   COPD (chronic obstructive pulmonary disease) (H[facility])   H/O: hypothyroidism   Acute on chronic diastolic heart failure (H[facility])   Absolute anemia   HLD (hyperlipidemia)   Essential hypertension   PAF (paroxysmal atrial fibrillation) ([place])   #1. Respiratory distress in a patient with chronic respiratory failure related to COPD and CHF. Likely acute on chronic diastolic heart failure and COPD exacerbation.  She has increased oxygen demand. Usually on 2 L at home and requiring 4 L to keep oxygen saturation level greater than 90%. Chest x-ray reveals cardiomegaly with interstitial pulmonary edema and trace bilateral pleural effusion, BNP 547, no leukocytosis, initial troponin negative. Respiratory distress somewhat improved on admission after nebulizer IV Lasix and Solu-Medrol. -Admit to telemetry -Continue oxygen supplementation and weaned to baseline 2 L as able -IV Lasix 40 mg twice a day -Monitor intake and output -Obtain daily weights -Continue ACE inhibitor -Scheduled nebulizers -Solu-Medrol 60 mg IV -Levaquin -Continue Spiriva  #2. Acute on chronic combined heart failure. BNP elevated, chest x-ray as noted above. Echo done in August of this year revealed mild LVH and an EF of 40% although difficult acute state windows noted. This is also noted to be a worsening state from April of this year. Home medications include Symbicort, Lasix, lisinopril. -We will continue lisinopril -Continue home inhalers -IV Lasix as noted above -Obtain daily weights -Monitor intake and output -I will repeat the echo given the change from April to August have concerns for worsening from August now  3. COPD with exacerbation. Home oxygen is usually 2 L. Is unaware of any recent pulmonary function tests. Chest x-ray as noted above. Chest x-ray as noted above. Chest 7 increased oxygen demand. -Continue oxygen and weaned to baseline is able -Scheduled meds -Continue inhalers -Continue Solu-Medrol -Levaquin  4. A. Fib. chadvasc score 7. Home medications include eliquis. No rate control medication list. EKG with normal sinus rhythm occasional PVC on admission. History of stroke -Continue home meds -Continue telemetry  #5. Hypertension. Pressure on the high end of normal in the emergency department. I medications include Lasix and lisinopril -Continue home meds -Monitor  #6. History of CVA. Exam benign. Chart  review indicates initially she was on Plavix then developed A. fib/A flutter this was changed to eliquis -physical therapy  #7. Anemia. No evidence of bleeding. Hemoglobin 10.6. Chart review indicates this is close to baseline -Outpatient follow-up    DVT prophylaxis: scd  Code Status: full  Family Communication: son at bedside  Disposition Plan: home  Consults called: none  Admission status: observation    Radene Gunning MD [facility]  If 7PM-7AM, please contact night-coverage [email or web address] Password  [email or web address]  12/15/2015, 1:02 PM

## 2015-12-15 NOTE — ED Triage Notes (Signed)
Pt sts increased SOB x 2 days; pt sts pain with cough and cough is non productive; pt labored and on home O2

## 2015-12-15 NOTE — ED Provider Notes (Signed)
Emergency Department Provider Note   I have reviewed the triage vital signs and the nursing notes.   HISTORY  Chief Complaint Shortness of Breath   HPI [name] is a 82 y.o. female with past medical history significant for hypertension, CHF, COPD, paroxysmal atrial fibrillation and CVA came to the ED with complaint of worsening shortness of breath for last few days. Patient states that she uses 2 L of oxygen at home, she was having worsening shortness of breath to the point that she is unable to go to restroom without being short of breath. She do endorse some productive cough and congestion mostly in the morning, she states that she has not noticed any recent change in her cough. She tried using her Symbicort and Proventil inhaler with not much relief. She do endorse some lower extremity edema, states that it is better today as she increased her Lasix recently. She denies any fever or chills, denies any chest pain, no palpitation, no orthopnea or PND. She denies any dysuria or hematuria.   Past Medical History:  Diagnosis Date  . Anemia    years ago  . Arthritis    "right shoulder" (01/02/2012)  . Bladder mass   . CAP (community acquired pneumonia) 01/02/2012   Lower lobe   . CHF (congestive heart failure) ([place])   . COPD (chronic obstructive pulmonary disease) ([place])   . Difficulty sleeping   . Diverticulitis of colon with perforation 01/14/2012   That is post sigmoid resection with [facility] pouch and end colostomy   . GERD (gastroesophageal reflux disease)    occasional  . Goiter    radioactive iodine ablation/notes 01/02/2012  . History of kidney stones    X 1  . Hypertension   . Hypothyroidism    "I have taken Synthroid before" (01/02/2012)   HAS HAD RADIOACTIVE IODINE TX 2 YR S AGO  . Intra-abdominal abscess ([place]) 01/14/2012  . Pneumatosis of intestines 01/14/2012  . Pneumonia 01/02/2012; ? 2009  . Radiation 08/10/13-09/15/13   57 gray to vaginal/bladder mass  . SOB  (shortness of breath)    'all the time" (01/02/2012)  . Stroke ([place])   . UTI (lower urinary tract infection) 01/03/2012    Patient Active Problem List   Diagnosis Date Noted  . Respiratory distress 12/15/2015  . Chronic respiratory failure ([place]) 12/15/2015  . PAF (paroxysmal atrial fibrillation) ([place]) 08/27/2015  . Chronic diastolic (congestive) heart failure 08/23/2015  . Essential hypertension 06/15/2015  . Squamous cell carcinoma, metastatic ([place]) 06/15/2015  . Acute CVA (cerebrovascular accident) ([place]) 04/13/2015  . Cerebrovascular accident (CVA) due to embolism of left middle cerebral artery ([place])   . HLD (hyperlipidemia)   . Cerebral thrombosis with cerebral infarction 04/12/2015  . Stroke-like symptoms   . Dysarthria 04/11/2015  . Absolute anemia 09/04/2014  . Elevated troponin 02/14/2014  . Dyspnea   . CAP (community acquired pneumonia) 02/13/2014  . Overweight (BMI 25.0-29.9) 02/13/2014  . Goiter 02/13/2014  . Acute on chronic diastolic heart failure ([place]) 02/13/2014  . Sepsis due to undetermined organism with acute respiratory failure ([place]) 02/13/2014  . Squamous cell carcinoma of unknown origin ([place]) 07/24/2013  . S/P colostomy takedown 10/21/2012 10/23/2012  . Diverticulosis of colon 03/07/2012  . Pulmonary nodules 01/23/2012  . Hematuria, microscopic 01/14/2012  . Hyponatremia 01/14/2012  . Asymptomatic PVCs 01/03/2012  . H/O: hypothyroidism 01/03/2012  . COPD (chronic obstructive pulmonary disease) ([place]) 01/02/2012  . Hypertension 01/02/2012  . Chronic combined systolic and diastolic congestive heart  failure ([place]) 01/02/2012  . Hx of goiter 01/02/2012    Past Surgical History:  Procedure Laterality Date  . ABDOMINAL HYSTERECTOMY  1980's  . APPENDECTOMY  1980's   "when they did hysterectomy" (01/02/2012)  . CATARACT EXTRACTION W/ INTRAOCULAR LENS  IMPLANT, BILATERAL  ?1990's   [place]  . COLON SURGERY    . COLOSTOMY N/A 03/12/2012   Procedure: COLOSTOMY;  Surgeon:  Ralene Ok, MD;  Location: [facility];  Service: General;  Laterality: N/A;  . COLOSTOMY REVISION N/A 03/12/2012   Procedure: COLON RESECTION SIGMOID ;  Surgeon: Ralene Ok, MD;  Location: [facility];  Service: General;  Laterality: N/A;  . COLOSTOMY TAKEDOWN N/A 10/21/2012   Procedure: LAPAROSCOPIC COLOSTOMY TAKEDOWN AND HARTMANS ANASTOMOSIS, rigid proctoscopy;  Surgeon: Ralene Ok, MD;  Location: [facility];  Service: General;  Laterality: N/A;  . EYE SURGERY    . LYSIS OF ADHESION N/A 10/21/2012   Procedure: LYSIS OF ADHESION;  Surgeon: Ralene Ok, MD;  Location: [facility];  Service: General;  Laterality: N/A;  . TRANSURETHRAL RESECTION OF BLADDER TUMOR WITH GYRUS (TURBT-GYRUS) N/A 06/30/2013   Procedure: TRANSURETHRAL RESECTION OF BLADDER TUMOR WITH GYRUS (TURBT-GYRUS)/VAGINAL BIOPSY;  Surgeon: Ardis Hughs, MD;  Location: [facility];  Service: Urology;  Laterality: N/A;    Current Outpatient Rx  . Order #: [id number] Class: Historical Med  . Order #: [id number] Class: Historical Med  . Order #: [id number] Class: Print  . Order #: [id number] Class: Normal  . Order #: [id number] Class: Historical Med  . Order #: [id number] Class: Historical Med  . Order #: [id number] Class: Historical Med  . Order #: [id number] Class: No Print  . Order #: [id number] Class: Normal  . Order #: [id number] Class: Historical Med  . Order #: [id number] Class: Historical Med  . Order #: [id number] Class: Historical Med    Allergies Indomethacin  Family History  Problem Relation Age of Onset  . Cancer Sister     Breast  . Stroke Sister   . Heart disease Mother   . Heart disease      Social History Social History  Substance Use Topics  . Smoking status: Former Smoker    Packs/day: 0.12    Years: 35.00    Types: Cigarettes    Quit date: 01/02/1991  . Smokeless tobacco: Never Used  . Alcohol use No    Review of Systems Constitutional: No fever/chills Eyes: No visual changes. ENT: No sore throat. Cardiovascular:  Denies chest pain. Respiratory:  shortness of breath. Gastrointestinal: No abdominal pain.  No nausea, no vomiting.  No diarrhea.  No constipation. Genitourinary: Negative for dysuria. Musculoskeletal: Negative for back pain. Skin: Negative for rash. Neurological: Negative for headaches, focal weakness or numbness. 10-point ROS otherwise negative.  ____________________________________________   PHYSICAL EXAM:  VITAL SIGNS: ED Triage Vitals [12/15/15 0906]  Enc Vitals Group     BP 176/79     Pulse Rate 84     Resp 24     Temp 98.1 F (36.7 C)     Temp Source Oral     SpO2 92 %     Weight      Height      Head Circumference      Peak Flow      Pain Score 5     Pain Loc      Pain Edu?      Excl. in [place]?    Constitutional: Alert and oriented.Looks in distress because of labored breathing. Eyes: Conjunctivae are normal. PERRL. EOMI. Head: Atraumatic. Nose: No congestion/rhinnorhea. Mouth/Throat: Mucous  membranes are moist.  Oropharynx non-erythematous. Neck: No stridor.  No meningeal signs. No JVD Cardiovascular: Normal rate, regular rhythm. Good peripheral circulation. Grossly normal heart sounds.   Respiratory: Labored breathing with accessory muscle use, poor air entry bilaterally, no wheezing or rhonchi. Gastrointestinal: Soft and nontender. No distention.  Musculoskeletal: No lower extremity tenderness nor edema. No gross deformities of extremities. Neurologic:  Normal speech and language. No gross focal neurologic deficits are appreciated.  Skin:  Skin is warm, dry and intact. No rash noted.   ____________________________________________   LABS (all labs ordered are listed, but only abnormal results are displayed)  Labs Reviewed  CBC WITH DIFFERENTIAL/PLATELET - Abnormal; Notable for the following:       Result Value   RBC 3.21 (*)    Hemoglobin 10.6 (*)    HCT 31.4 (*)    Lymphs Abs 0.4 (*)    All other components within normal limits  BASIC METABOLIC PANEL  - Abnormal; Notable for the following:    Glucose, Bld 113 (*)    Calcium 8.7 (*)    All other components within normal limits  BRAIN NATRIURETIC PEPTIDE - Abnormal; Notable for the following:    B Natriuretic Peptide 529.4 (*)    All other components within normal limits  TROPONIN I   ____________________________________________  EKG   EKG Interpretation  Date/Time:  Thursday December 15 2015 09:04:35 EST Ventricular Rate:  81 PR Interval:  168 QRS Duration: 132 QT Interval:  396 QTC Calculation: 460 R Axis:   11 Text Interpretation:  Sinus rhythm with occasional Premature ventricular complexes Non-specific intra-ventricular conduction block Cannot rule out Septal infarct , age undetermined T wave abnormality, consider lateral ischemia Abnormal ECG Since last tracing rate slower Otherwise no significant change Confirmed by KNOTT MD, DANIEL [phone]) on 12/15/2015 9:13:41 AM      ____________________________________________  RADIOLOGY  Dg Chest 2 View  Result Date: 12/15/2015 CLINICAL DATA:  Increasing shortness of breath and congestion. EXAM: CHEST  2 VIEW COMPARISON:  11/08/2015 and prior exams FINDINGS: Cardiomegaly noted. New interstitial opacities likely represent edema. Trace bilateral pleural effusions and minimal bibasilar atelectasis noted. There is no evidence of pneumothorax. No acute bony abnormalities are identified. IMPRESSION: Cardiomegaly with interstitial pulmonary edema and trace bilateral pleural effusions. Electronically Signed   By: Margarette Canada M.D.   On: 12/15/2015 11:10    ____________________________________________   PROCEDURES  Procedure(s) performed:   Procedures   ____________________________________________   INITIAL IMPRESSION / ASSESSMENT AND PLAN / ED COURSE  Pertinent labs & imaging results that were available during my care of the patient were reviewed by me and considered in my medical decision making (see chart for  details).  OLUBUNMI UHER is a 82 y.o. female with past medical history significant for hypertension, CHF, COPD, paroxysmal atrial fibrillation and CVA came to the ED with complaint of worsening shortness of breath for last few days. Patient states that she uses 2 L of oxygen at home, she was having worsening shortness of breath to the point that she is unable to go to restroom without being short of breath. She do endorse some productive cough and congestion mostly in the morning, she states that she has not noticed any recent change in her cough.  It's more consistent with COPD exacerbation. We will give her DuoNeb treatment and Solu-Medrol and then reevaluate her.  Her air movement improved after 1 dose of DuoNeb, still short of breath. There are basal crackles bilaterally.  I will  repeat one more dose of DuoNeb, and give her Lasix 40 mg IV.  Her chest x-ray was positive for interstitial pulmonary edema and trace bilateral pleural effusion. BNP was 529.4.  She is most likely having COPD and CHF exacerbation.  She is being admitted for further management.  ___________________________________________  FINAL CLINICAL IMPRESSION(S) / ED DIAGNOSES  Final diagnoses:  Acute on chronic congestive heart failure, unspecified congestive heart failure type ([place])     MEDICATIONS GIVEN DURING THIS VISIT:  Medications  apixaban (ELIQUIS) tablet 5 mg (not administered)  budesonide-formoterol (SYMBICORT) 80-4.5 MCG/ACT inhaler 2 puff (2 puffs Inhalation Not Given 12/15/15 1236)  atorvastatin (LIPITOR) tablet 40 mg (not administered)  lisinopril (PRINIVIL,ZESTRIL) tablet 20 mg (not administered)  polyvinyl alcohol (LIQUIFILM TEARS) 1.4 % ophthalmic solution 1 drop (not administered)  sodium chloride flush (NS) 0.9 % injection 3 mL (not administered)  sodium chloride flush (NS) 0.9 % injection 3 mL (not administered)  0.9 %  sodium chloride infusion (not administered)  acetaminophen (TYLENOL)  tablet 650 mg (not administered)  ondansetron (ZOFRAN) injection 4 mg (not administered)  furosemide (LASIX) injection 40 mg (not administered)  potassium chloride SA (K-DUR,KLOR-CON) CR tablet 20 mEq (not administered)  ALPRAZolam (XANAX) tablet 0.25 mg (not administered)  methylPREDNISolone sodium succinate (SOLU-MEDROL) 125 mg/2 mL injection 60 mg (not administered)  levofloxacin (LEVAQUIN) tablet 500 mg (not administered)  albuterol (PROVENTIL) (2.5 MG/3ML) 0.083% nebulizer solution 2.5 mg (not administered)  ipratropium-albuterol (DUONEB) 0.5-2.5 (3) MG/3ML nebulizer solution 3 mL (3 mLs Nebulization Given 12/15/15 0944)  methylPREDNISolone sodium succinate (SOLU-MEDROL) 125 mg/2 mL injection 125 mg (125 mg Intravenous Given 12/15/15 0939)  ipratropium-albuterol (DUONEB) 0.5-2.5 (3) MG/3ML nebulizer solution 3 mL (3 mLs Nebulization Given 12/15/15 1118)  furosemide (LASIX) injection 40 mg (40 mg Intravenous Given 12/15/15 1149)     NEW OUTPATIENT MEDICATIONS STARTED DURING THIS VISIT:  New Prescriptions   No medications on file      Note:  This document was prepared using Dragon voice recognition software and may include unintentional dictation errors.  Emergency Medicine   Lorella Nimrod, MD 12/15/15 [place], MD 12/16/15 [phone]

## 2015-12-15 NOTE — ED Notes (Signed)
Patient taken to xray.

## 2015-12-15 NOTE — Progress Notes (Signed)
Pt refused bed alarm on. 

## 2015-12-16 DIAGNOSIS — R0603 Acute respiratory distress: Secondary | ICD-10-CM | POA: Diagnosis present

## 2015-12-16 DIAGNOSIS — J9611 Chronic respiratory failure with hypoxia: Secondary | ICD-10-CM | POA: Diagnosis not present

## 2015-12-16 DIAGNOSIS — Z87891 Personal history of nicotine dependence: Secondary | ICD-10-CM | POA: Diagnosis not present

## 2015-12-16 DIAGNOSIS — J9621 Acute and chronic respiratory failure with hypoxia: Secondary | ICD-10-CM

## 2015-12-16 DIAGNOSIS — Z9981 Dependence on supplemental oxygen: Secondary | ICD-10-CM | POA: Diagnosis not present

## 2015-12-16 DIAGNOSIS — I480 Paroxysmal atrial fibrillation: Secondary | ICD-10-CM | POA: Diagnosis present

## 2015-12-16 DIAGNOSIS — I493 Ventricular premature depolarization: Secondary | ICD-10-CM | POA: Diagnosis present

## 2015-12-16 DIAGNOSIS — Z8673 Personal history of transient ischemic attack (TIA), and cerebral infarction without residual deficits: Secondary | ICD-10-CM | POA: Diagnosis not present

## 2015-12-16 DIAGNOSIS — Z7901 Long term (current) use of anticoagulants: Secondary | ICD-10-CM | POA: Diagnosis not present

## 2015-12-16 DIAGNOSIS — J438 Other emphysema: Secondary | ICD-10-CM | POA: Diagnosis not present

## 2015-12-16 DIAGNOSIS — I509 Heart failure, unspecified: Secondary | ICD-10-CM | POA: Diagnosis present

## 2015-12-16 DIAGNOSIS — Z87442 Personal history of urinary calculi: Secondary | ICD-10-CM | POA: Diagnosis not present

## 2015-12-16 DIAGNOSIS — I110 Hypertensive heart disease with heart failure: Secondary | ICD-10-CM | POA: Diagnosis present

## 2015-12-16 DIAGNOSIS — Z79899 Other long term (current) drug therapy: Secondary | ICD-10-CM | POA: Diagnosis not present

## 2015-12-16 DIAGNOSIS — Z7951 Long term (current) use of inhaled steroids: Secondary | ICD-10-CM | POA: Diagnosis not present

## 2015-12-16 DIAGNOSIS — Z823 Family history of stroke: Secondary | ICD-10-CM | POA: Diagnosis not present

## 2015-12-16 DIAGNOSIS — Z803 Family history of malignant neoplasm of breast: Secondary | ICD-10-CM | POA: Diagnosis not present

## 2015-12-16 DIAGNOSIS — J441 Chronic obstructive pulmonary disease with (acute) exacerbation: Secondary | ICD-10-CM

## 2015-12-16 DIAGNOSIS — Z8249 Family history of ischemic heart disease and other diseases of the circulatory system: Secondary | ICD-10-CM | POA: Diagnosis not present

## 2015-12-16 DIAGNOSIS — I5033 Acute on chronic diastolic (congestive) heart failure: Secondary | ICD-10-CM | POA: Diagnosis not present

## 2015-12-16 DIAGNOSIS — D649 Anemia, unspecified: Secondary | ICD-10-CM | POA: Diagnosis present

## 2015-12-16 DIAGNOSIS — I5043 Acute on chronic combined systolic (congestive) and diastolic (congestive) heart failure: Secondary | ICD-10-CM | POA: Diagnosis present

## 2015-12-16 DIAGNOSIS — I10 Essential (primary) hypertension: Secondary | ICD-10-CM | POA: Diagnosis not present

## 2015-12-16 DIAGNOSIS — E785 Hyperlipidemia, unspecified: Secondary | ICD-10-CM | POA: Diagnosis present

## 2015-12-16 LAB — BASIC METABOLIC PANEL
Anion gap: 9 (ref 5–15)
BUN: 20 mg/dL (ref 6–20)
CALCIUM: 9 mg/dL (ref 8.9–10.3)
CO2: 28 mmol/L (ref 22–32)
CREATININE: 0.94 mg/dL (ref 0.44–1.00)
Chloride: 101 mmol/L (ref 101–111)
GFR calc non Af Amer: 53 mL/min — ABNORMAL LOW (ref >60)
Glucose, Bld: 152 mg/dL — ABNORMAL HIGH (ref 65–99)
Potassium: 4.1 mmol/L (ref 3.5–5.1)
SODIUM: 138 mmol/L (ref 135–145)

## 2015-12-16 MED ORDER — METHYLPREDNISOLONE SODIUM SUCC 40 MG IJ SOLR
40.0000 mg | Freq: Two times a day (BID) | INTRAMUSCULAR | Status: DC
  Administered 2015-12-16 – 2015-12-17 (×2): 40 mg via INTRAVENOUS
  Filled 2015-12-16 (×2): qty 1

## 2015-12-16 MED ORDER — IPRATROPIUM-ALBUTEROL 0.5-2.5 (3) MG/3ML IN SOLN
3.0000 mL | Freq: Four times a day (QID) | RESPIRATORY_TRACT | Status: DC
  Administered 2015-12-16: 3 mL via RESPIRATORY_TRACT
  Filled 2015-12-16: qty 3

## 2015-12-16 MED ORDER — ALBUTEROL SULFATE (2.5 MG/3ML) 0.083% IN NEBU: 2.5000 mg | INHALATION_SOLUTION | RESPIRATORY_TRACT | Status: DC | PRN

## 2015-12-16 NOTE — Progress Notes (Signed)
Pt refused bed alarm.  Encourage to call.  Call bell at reach.  Will continue to monitor.  Karie Kirks, RN

## 2015-12-16 NOTE — Progress Notes (Addendum)
PROGRESS NOTE  [name]  [id number] DOB: Apr 28, 1925  DOA: 12/15/2015 PCP: Leonard Downing, MD   Brief Narrative:  82 year old female, daughter lives with her, ambulates with the help of a walker, chronic hypoxic respiratory failure on oxygen 2 L/m continuously, COPD, chronic combined CHF, GERD, HTN, hypothyroid, PAF, CVA, prior pneumonia presented to ED with worsening dyspnea and admitted for suspected decompensated CHF and COPD exacerbation. Improving.   Assessment & Plan:   Principal Problem:   Respiratory distress Active Problems:   COPD (chronic obstructive pulmonary disease) (H[facility])   H/O: hypothyroidism   Acute on chronic diastolic heart failure (H[facility])   Absolute anemia   HLD (hyperlipidemia)   Essential hypertension   PAF (paroxysmal atrial fibrillation) ([facility])   Chronic respiratory failure ([place])   1. Acute on chronic hypoxic respiratory failure: Secondary to COPD and CHF exacerbation. Treating underlying cause. Clinically improving. Wean oxygen as tolerated to baseline 2 L/m. 2. COPD exacerbation: Continue oxygen, bronchodilator nebulizations, IV Solu-Medrol, short course of levofloxacin. Improving. Quit smoking 40+ years ago. 3. Acute on chronic combined systolic and diastolic CHF: BNP [id number]. Treating with IV Lasix 40 mg twice a day. -90 mL documented (not sure if accurate). Continue ACEI. Repeat 2-D echo results as below and not significantly changed from prior. 4. Paroxysmal A. Fib: chadvasc score 7. Currently in sinus rhythm. Continue Eliquis. 5. Essential hypertension: Controlled. Continue lisinopril. 6. History of CVA: Remains on anticoagulation. 7. Anemia: Stable. Periodically follow CBCs.   DVT prophylaxis: On Eliquis Code Status: Full Family Communication: None at bedside Disposition Plan: DC home possibly in the next 1-2 days.   Consultants:   None  Procedures:   2-D echo 12/15/15: Study Conclusions  - Left ventricle: The cavity size was  normal. Wall thickness was   increased in a pattern of mild LVH. Systolic function was mildly   reduced. The estimated ejection fraction was in the range of 45%   to 50%. - Mitral valve: Calcified annulus. Mildly thickened leaflets . - Left atrium: The atrium was moderately dilated.  Impressions:  - When compared to report of Aug 2017 there is minimal change in   LVEF  Antimicrobials:   Levofloxacin    Subjective: States that she feels much better. Dyspnea has improved and almost back to baseline and wants to know when she can go home. Denies cough or chest pain.  Objective:  Vitals:   12/16/15 0820 12/16/15 0928 12/16/15 1013 12/16/15 1458  BP: (!) 146/87  139/79   Pulse: 78  74   Resp:   18   Temp:   97.8 F (36.6 C)   TempSrc:   Oral   SpO2:  97% 99% 99%  Weight:      Height:        Intake/Output Summary (Last 24 hours) at 12/16/15 1510 Last data filed at 12/16/15 1405  Gross per 24 hour  Intake             1651 ml  Output             1300 ml  Net              351 ml   Filed Weights   12/15/15 1403 12/16/15 0514  Weight: 70.7 kg (155 lb 12.8 oz) 70.9 kg (156 lb 4.8 oz)    Examination:  General exam: Pleasant elderly female sitting up comfortably in chair this morning. Respiratory system: Good breath sounds bilaterally. Occasional basal crackles and occasional rhonchi posteriorly. Respiratory effort  normal. Cardiovascular system: S1 & S2 heard, RRR. No JVD, murmurs, rubs, gallops or clicks. No pedal edema. Telemetry: Sinus rhythm. Gastrointestinal system: Abdomen is nondistended, soft and nontender. No organomegaly or masses felt. Normal bowel sounds heard. Central nervous system: Alert and oriented. No focal neurological deficits. Extremities: Symmetric 5 x 5 power. Skin: No rashes, lesions or ulcers Psychiatry: Judgement and insight appear normal. Mood & affect appropriate.     Data Reviewed: I have personally reviewed following labs and imaging  studies  CBC:  Recent Labs Lab 12/15/15 0947  WBC 5.1  NEUTROABS 4.2  HGB 10.6*  HCT 31.4*  MCV 97.8  PLT 99991111   Basic Metabolic Panel:  Recent Labs Lab 12/15/15 0947 12/16/15 0317  NA 141 138  K 4.5 4.1  CL 107 101  CO2 28 28  GLUCOSE 113* 152*  BUN 13 20  CREATININE 0.77 0.94  CALCIUM 8.7* 9.0   GFR: Estimated Creatinine Clearance: 40 mL/min (by C-G formula based on SCr of 0.94 mg/dL). Liver Function Tests: No results for input(s): AST, ALT, ALKPHOS, BILITOT, PROT, ALBUMIN in the last 168 hours. No results for input(s): LIPASE, AMYLASE in the last 168 hours. No results for input(s): AMMONIA in the last 168 hours. Coagulation Profile: No results for input(s): INR, PROTIME in the last 168 hours. Cardiac Enzymes:  Recent Labs Lab 12/15/15 0947  TROPONINI <0.03   BNP (last 3 results) No results for input(s): PROBNP in the last 8760 hours. HbA1C: No results for input(s): HGBA1C in the last 72 hours. CBG: No results for input(s): GLUCAP in the last 168 hours. Lipid Profile: No results for input(s): CHOL, HDL, LDLCALC, TRIG, CHOLHDL, LDLDIRECT in the last 72 hours. Thyroid Function Tests: No results for input(s): TSH, T4TOTAL, FREET4, T3FREE, THYROIDAB in the last 72 hours. Anemia Panel: No results for input(s): VITAMINB12, FOLATE, FERRITIN, TIBC, IRON, RETICCTPCT in the last 72 hours.  Sepsis Labs: No results for input(s): PROCALCITON, LATICACIDVEN in the last 168 hours.  No results found for this or any previous visit (from the past 240 hour(s)).       Radiology Studies: Dg Chest 2 View  Result Date: 12/15/2015 CLINICAL DATA:  Increasing shortness of breath and congestion. EXAM: CHEST  2 VIEW COMPARISON:  11/08/2015 and prior exams FINDINGS: Cardiomegaly noted. New interstitial opacities likely represent edema. Trace bilateral pleural effusions and minimal bibasilar atelectasis noted. There is no evidence of pneumothorax. No acute bony abnormalities  are identified. IMPRESSION: Cardiomegaly with interstitial pulmonary edema and trace bilateral pleural effusions. Electronically Signed   By: Margarette Canada M.D.   On: 12/15/2015 11:10        Scheduled Meds: . albuterol  2.5 mg Nebulization Q6H  . apixaban  5 mg Oral BID  . atorvastatin  40 mg Oral q1800  . furosemide  40 mg Intravenous BID  . levofloxacin  500 mg Oral Daily  . lisinopril  20 mg Oral Daily  . methylPREDNISolone (SOLU-MEDROL) injection  60 mg Intravenous Q12H  . mometasone-formoterol  2 puff Inhalation BID  . potassium chloride  20 mEq Oral Daily  . sodium chloride flush  3 mL Intravenous Q12H   Continuous Infusions:   LOS: 0 days      [facility] MD [facility] Pager [phone]  If 7PM-7AM, please contact night-coverage [email or web address] Password [email or web address] 12/16/2015, 3:10 PM

## 2015-12-16 NOTE — Progress Notes (Signed)
Pt and Pt family requesting home Neb treatments upon discharge.

## 2015-12-17 DIAGNOSIS — J438 Other emphysema: Secondary | ICD-10-CM

## 2015-12-17 DIAGNOSIS — J9611 Chronic respiratory failure with hypoxia: Secondary | ICD-10-CM

## 2015-12-17 DIAGNOSIS — I5033 Acute on chronic diastolic (congestive) heart failure: Secondary | ICD-10-CM

## 2015-12-17 LAB — BASIC METABOLIC PANEL
ANION GAP: 8 (ref 5–15)
BUN: 28 mg/dL — ABNORMAL HIGH (ref 6–20)
CALCIUM: 8.9 mg/dL (ref 8.9–10.3)
CHLORIDE: 100 mmol/L — AB (ref 101–111)
CO2: 29 mmol/L (ref 22–32)
CREATININE: 1.1 mg/dL — AB (ref 0.44–1.00)
GFR calc non Af Amer: 44 mL/min — ABNORMAL LOW (ref >60)
GFR, EST AFRICAN AMERICAN: 50 mL/min — AB (ref >60)
GLUCOSE: 150 mg/dL — AB (ref 65–99)
Potassium: 4.5 mmol/L (ref 3.5–5.1)
Sodium: 137 mmol/L (ref 135–145)

## 2015-12-17 MED ORDER — FUROSEMIDE 40 MG PO TABS: 40.0000 mg | ORAL_TABLET | Freq: Every day | ORAL | 0 refills | Status: DC

## 2015-12-17 MED ORDER — ALBUTEROL SULFATE (2.5 MG/3ML) 0.083% IN NEBU: 2.5000 mg | INHALATION_SOLUTION | Freq: Four times a day (QID) | RESPIRATORY_TRACT | 12 refills | Status: DC | PRN

## 2015-12-17 MED ORDER — FUROSEMIDE 40 MG PO TABS: 40.0000 mg | ORAL_TABLET | Freq: Every day | ORAL | Status: DC

## 2015-12-17 MED ORDER — ALBUTEROL SULFATE (2.5 MG/3ML) 0.083% IN NEBU: 2.5000 mg | INHALATION_SOLUTION | Freq: Four times a day (QID) | RESPIRATORY_TRACT | 12 refills | Status: AC | PRN

## 2015-12-17 MED ORDER — PREDNISONE 10 MG PO TABS: ORAL_TABLET | ORAL | 0 refills | Status: DC

## 2015-12-17 MED ORDER — IPRATROPIUM-ALBUTEROL 0.5-2.5 (3) MG/3ML IN SOLN
3.0000 mL | Freq: Three times a day (TID) | RESPIRATORY_TRACT | Status: DC
  Administered 2015-12-17: 3 mL via RESPIRATORY_TRACT
  Filled 2015-12-17: qty 3

## 2015-12-17 NOTE — Discharge Instructions (Signed)
Follow with Primary MD Leonard Downing, MD in 7 days   Get CBC, CMP, 2 view Chest X ray checked  by Primary MD next visit.    Activity: As tolerated with Full fall precautions use walker/cane & assistance as needed   Disposition Home    Diet: Heart Healthy  , with feeding assistance and aspiration precautions.  For Heart failure patients - Check your Weight same time everyday, if you gain over 2 pounds, or you develop in leg swelling, experience more shortness of breath or chest pain, call your Primary MD immediately. Follow Cardiac Low Salt Diet and 1.5 lit/day fluid restriction.   On your next visit with your primary care physician please Get Medicines reviewed and adjusted.   Please request your Prim.MD to go over all Hospital Tests and Procedure/Radiological results at the follow up, please get all Hospital records sent to your Prim MD by signing hospital release before you go home.   If you experience worsening of your admission symptoms, develop shortness of breath, life threatening emergency, suicidal or homicidal thoughts you must seek medical attention immediately by calling 911 or calling your MD immediately  if symptoms less severe.  You Must read complete instructions/literature along with all the possible adverse reactions/side effects for all the Medicines you take and that have been prescribed to you. Take any new Medicines after you have completely understood and accpet all the possible adverse reactions/side effects.   Do not drive, operating heavy machinery, perform activities at heights, swimming or participation in water activities or provide baby sitting services if your were admitted for syncope or siezures until you have seen by Primary MD or a Neurologist and advised to do so again.  Do not drive when taking Pain medications.    Do not take more than prescribed Pain, Sleep and Anxiety Medications  Special Instructions: If you have smoked or chewed Tobacco   in the last 2 yrs please stop smoking, stop any regular Alcohol  and or any Recreational drug use.  Wear Seat belts while driving.   Please note  You were cared for by a hospitalist during your hospital stay. If you have any questions about your discharge medications or the care you received while you were in the hospital after you are discharged, you can call the unit and asked to speak with the hospitalist on call if the hospitalist that took care of you is not available. Once you are discharged, your primary care physician will handle any further medical issues. Please note that NO REFILLS for any discharge medications will be authorized once you are discharged, as it is imperative that you return to your primary care physician (or establish a relationship with a primary care physician if you do not have one) for your aftercare needs so that they can reassess your need for medications and monitor your lab values.

## 2015-12-17 NOTE — Evaluation (Signed)
Physical Therapy Evaluation Patient Details Name: [name] MRN: [id number] DOB: 1925-11-04 Today's Date: 12/17/2015   History of Present Illness  Pt is an 82 y/o female admitted secondary to SOB. PMH including but not limited to COPD (dependent on 2L of O2 at all times), CHF, and hx of CVA.  Clinical Impression  Pt presented sitting OOB in recliner when PT entered room. Prior to admission, pt reported that she was mod I for ambulation with use of RW, independent with ADLs, and needs assistance with IADLs. Pt would continue to benefit from skilled physical therapy services at this time while admitted to address her below listed limitations in order to improve her overall safety and independence with functional mobility.       Follow Up Recommendations No PT follow up    Equipment Recommendations  None recommended by PT    Recommendations for Other Services       Precautions / Restrictions Restrictions Weight Bearing Restrictions: No      Mobility  Bed Mobility               General bed mobility comments: pt sitting OOB in recliner when PT entered room  Transfers Overall transfer level: Needs assistance Equipment used: None Transfers: Sit to/from Stand Sit to Stand: Supervision         General transfer comment: increased time  Ambulation/Gait Ambulation/Gait assistance: Min guard Ambulation Distance (Feet): 100 Feet Assistive device: Rolling walker (2 wheeled) Gait Pattern/deviations: Step-through pattern;Decreased stride length;Trunk flexed Gait velocity: decreased   General Gait Details: pt demonstrated safety with use of RW. pt ambulated on 2 L of O2 with SPO2 maintaining >93% throughout.  Stairs            Wheelchair Mobility    Modified Rankin (Stroke Patients Only)       Balance Overall balance assessment: Needs assistance Sitting-balance support: Feet supported;No upper extremity supported Sitting balance-Leahy Scale: Good      Standing balance support: During functional activity;No upper extremity supported Standing balance-Leahy Scale: Fair                               Pertinent Vitals/Pain Pain Assessment: No/denies pain    Home Living Family/patient expects to be discharged to:: Private residence Living Arrangements: Children Available Help at Discharge: Family;Available PRN/intermittently Type of Home: House Home Access: Stairs to enter Entrance Stairs-Rails: Psychiatric nurse of Steps: 1 Home Layout: One level Home Equipment: Walker - 2 wheels;Shower seat      Prior Function Level of Independence: Needs assistance   Gait / Transfers Assistance Needed: Uses RW for mobility.  ADL's / Homemaking Assistance Needed: Pt reported independence with dressing and bathing. She stated that she will cook breakfast for herself most days, but that her family assists her with cleaning, shopping, driving.        Hand Dominance        Extremity/Trunk Assessment   Upper Extremity Assessment Upper Extremity Assessment: Overall WFL for tasks assessed    Lower Extremity Assessment Lower Extremity Assessment: Generalized weakness    Cervical / Trunk Assessment Cervical / Trunk Assessment: Kyphotic  Communication   Communication: No difficulties  Cognition Arousal/Alertness: Awake/alert Behavior During Therapy: WFL for tasks assessed/performed Overall Cognitive Status: Within Functional Limits for tasks assessed                      General Comments  Exercises     Assessment/Plan    PT Assessment Patient needs continued PT services  PT Problem List Decreased activity tolerance;Decreased balance;Decreased mobility;Decreased coordination;Cardiopulmonary status limiting activity          PT Treatment Interventions DME instruction;Gait training;Stair training;Functional mobility training;Therapeutic activities;Therapeutic exercise;Balance  training;Neuromuscular re-education;Patient/family education    PT Goals (Current goals can be found in the Care Plan section)  Acute Rehab PT Goals Patient Stated Goal: return home today PT Goal Formulation: With patient Time For Goal Achievement: 12/31/15 Potential to Achieve Goals: Good    Frequency Min 3X/week   Barriers to discharge        Co-evaluation               End of Session Equipment Utilized During Treatment: Gait belt;Oxygen Activity Tolerance: Patient tolerated treatment well Patient left: in chair;with call bell/phone within reach;with family/visitor present Nurse Communication: Mobility status         Time: 0945-1000 PT Time Calculation (min) (ACUTE ONLY): 15 min   Charges:   PT Evaluation $PT Eval Low Complexity: 1 Procedure     PT G CodesClearnce Sorrel Laiylah Roettger 12/17/2015, 11:07 AM Sherie Don, PT, DPT [phone]

## 2015-12-17 NOTE — Care Management Note (Addendum)
Case Management Note  Patient Details  Name: [name] MRN: [id number] Date of Birth: 1925-05-12  Subjective/Objective:     Respiratory distress, COPD, CHF               Action/Plan: Discharge Planning: AVS reviewed: Chart reviewed. NCM spoke to pt and lives at home with dtr. Received neb machine from [facility]. Will have dtr bring portable oxygen for home.   PCP Leonard Downing MD  Expected Discharge Date:  12/17/15               Expected Discharge Plan:  Home/Self Care  In-House Referral:  NA  Discharge planning Services  CM Consult  Post Acute Care Choice:  NA Choice offered to:  NA  DME Arranged:  Nebulizer machine DME Agency:  [place]:  NA [place] Agency:  NA  Status of Service:  Completed, signed off  If discussed at [place] of Stay Meetings, dates discussed:    Additional Comments:  Erenest Rasher, RN 12/17/2015, 10:41 AM

## 2015-12-17 NOTE — Discharge Summary (Signed)
[name], is a 82 y.o. female  DOB [birth year]  MRN [id number].  Admission date:  12/15/2015  Admitting Physician  Waldemar Dickens, MD  Discharge Date:  12/17/2015   Primary MD  Leonard Downing, MD  Recommendations for primary care physician for things to follow:  - Please check CBC, BMP, repeat 2 view chest x-ray during next visit   Admission Diagnosis  Acute on chronic congestive heart failure, unspecified congestive heart failure type ([place]) [I50.9]   Discharge Diagnosis  Acute on chronic congestive heart failure, unspecified congestive heart failure type ([place]) [I50.9]  Principal Problem:   Respiratory distress Active Problems:   COPD (chronic obstructive pulmonary disease) ([place])   H/O: hypothyroidism   Acute on chronic diastolic heart failure (HCC)   Absolute anemia   HLD (hyperlipidemia)   Essential hypertension   PAF (paroxysmal atrial fibrillation) ([place])   Chronic respiratory failure (HCC)      Past Medical History:  Diagnosis Date  . Anemia    years ago  . Arthritis    "right shoulder" (01/02/2012)  . Bladder mass   . CAP (community acquired pneumonia) 01/02/2012   Lower lobe   . CHF (congestive heart failure) ([place])   . COPD (chronic obstructive pulmonary disease) ([place])   . Difficulty sleeping   . Diverticulitis of colon with perforation 01/14/2012   That is post sigmoid resection with [facility] pouch and end colostomy   . GERD (gastroesophageal reflux disease)    occasional  . Goiter    radioactive iodine ablation/notes 01/02/2012  . History of kidney stones    X 1  . Hypertension   . Hypothyroidism    "I have taken Synthroid before" (01/02/2012)   HAS HAD RADIOACTIVE IODINE TX 2 YR S AGO  . Intra-abdominal abscess ([place]) 01/14/2012  . Pneumatosis of intestines 01/14/2012  . Pneumonia 01/02/2012; ? 2009  . Radiation 08/10/13-09/15/13   57 gray to vaginal/bladder mass  . SOB  (shortness of breath)    'all the time" (01/02/2012)  . Stroke ([place])   . UTI (lower urinary tract infection) 01/03/2012    Past Surgical History:  Procedure Laterality Date  . ABDOMINAL HYSTERECTOMY  1980's  . APPENDECTOMY  1980's   "when they did hysterectomy" (01/02/2012)  . CATARACT EXTRACTION W/ INTRAOCULAR LENS  IMPLANT, BILATERAL  ?1990's   Bil  . COLON SURGERY    . COLOSTOMY N/A 03/12/2012   Procedure: COLOSTOMY;  Surgeon: Ralene Ok, MD;  Location: [place];  Service: General;  Laterality: N/A;  . COLOSTOMY REVISION N/A 03/12/2012   Procedure: COLON RESECTION SIGMOID ;  Surgeon: Ralene Ok, MD;  Location: [place];  Service: General;  Laterality: N/A;  . COLOSTOMY TAKEDOWN N/A 10/21/2012   Procedure: LAPAROSCOPIC COLOSTOMY TAKEDOWN AND HARTMANS ANASTOMOSIS, rigid proctoscopy;  Surgeon: Ralene Ok, MD;  Location: [facility];  Service: General;  Laterality: N/A;  . EYE SURGERY    . LYSIS OF ADHESION N/A 10/21/2012   Procedure: LYSIS OF ADHESION;  Surgeon: Ralene Ok, MD;  Location: [facility];  Service: General;  Laterality: N/A;  . TRANSURETHRAL RESECTION OF BLADDER TUMOR WITH GYRUS (TURBT-GYRUS) N/A 06/30/2013   Procedure: TRANSURETHRAL RESECTION OF BLADDER TUMOR WITH GYRUS (TURBT-GYRUS)/VAGINAL BIOPSY;  Surgeon: Ardis Hughs, MD;  Location: [facility];  Service: Urology;  Laterality: N/A;       History of present illness and  Hospital Course:     Kindly see H&P for history of present illness and admission details, please review complete Labs, Consult reports and Test reports for all details in brief  HPI  from the history and physical done on the day of admission 12/15/2015 HPI: [name] is a delightful 82 y.o. female with medical history significant COPD on home oxygen at 2 L, chronic combined heart failure, paroxysmal A. fib, CVA, hypertension, previously acquired pneumonia 3 months ago requiring hospitalization since to the emergency department with a chief complaint  of shortness of breath. Initial evaluation reveals respiratory distress likely related to acute on chronic CHF and COPD exacerbation.  Information is obtained from the patient. She reports a 2 day history of gradually worsening shortness of breath and dyspnea with exertion. Associated symptoms include nonproductive cough and congestion. She denies headache dizziness syncope or near-syncope. She denies chest pain palpitations lower extremity edema. She denies abdominal pain nausea vomiting diarrhea constipation melena or bright red blood paretic complete. She denies any dysuria hematuria frequency or urgency. She denies fever chills recent travel or sick contacts. She does report that she was sitting outside on her porch over the weekend after this no enjoying "the cold air". She also reports using her Symbicort and Proventil inhaler without much relief.  ED Course: Emergency department she's afebrile hemodynamically stable, tachypnea with respiration rate 28 and increased oxygen demand. She is provided with Solu-Medrol 125 mg, Lasix intravenously 40 mg and a nebulizer. At the time of admission oxygen saturation level 97% on 4 L nasal cannula    Hospital Course   82 year old female, daughter lives with her, ambulates with the help of a walker, chronic hypoxic respiratory failure on oxygen 2 L/m continuously, COPD, chronic combined CHF, GERD, HTN, hypothyroid, PAF, CVA, prior pneumonia presented to ED with worsening dyspnea and admitted for suspected decompensated CHF and COPD exacerbation. Improving.  1. Acute on chronic hypoxic respiratory failure: Secondary to COPD and CHF exacerbation. Treating underlying cause. Clinically improving. Back to baseline 2 L/m. 2. COPD exacerbation: Continue oxyge, bronchodilator nebulizations,Improving, will transition to oral steroid taper. Quit smoking 40+ years ago. 3. Acute on chronic combined systolic and diastolic CHF: BNP [id number]. Treating with IV Lasix 40 mg  twice a day. -. Continue ACEI. Repeat 2-D echo results as below and not significantly changed from prior. With significant diuresis, currently euvolemic, will discharge on 40 mg oral Lasix daily 4. Paroxysmal A. Fib: chadvasc score 7. Currently in sinus rhythm. Continue Eliquis. 5. Essential hypertension: Controlled. Continue lisinopril. 6. History of CVA: Remains on anticoagulation. 7. Anemia: Stable. Periodically follow CBCs.   Discharge Condition: stable   Follow UP  Follow-up Information    Leonard Downing, MD Follow up in 1 week(s).   Specialty:  Family Medicine Contact information: [place] [address] [phone]             Discharge Instructions  and  Discharge Medications     Discharge Instructions    Discharge instructions    Complete by:  As directed    Follow with Primary MD Leonard Downing, MD in 7 days   Get  CBC, CMP, 2 view Chest X ray checked  by Primary MD next visit.    Activity: As tolerated with Full fall precautions use walker/cane & assistance as needed   Disposition Home    Diet: Heart Healthy  , with feeding assistance and aspiration precautions.  For Heart failure patients - Check your Weight same time everyday, if you gain over 2 pounds, or you develop in leg swelling, experience more shortness of breath or chest pain, call your Primary MD immediately. Follow Cardiac Low Salt Diet and 1.5 lit/day fluid restriction.   On your next visit with your primary care physician please Get Medicines reviewed and adjusted.   Please request your Prim.MD to go over all Hospital Tests and Procedure/Radiological results at the follow up, please get all Hospital records sent to your Prim MD by signing hospital release before you go home.   If you experience worsening of your admission symptoms, develop shortness of breath, life threatening emergency, suicidal or homicidal thoughts you must seek medical attention  immediately by calling 911 or calling your MD immediately  if symptoms less severe.  You Must read complete instructions/literature along with all the possible adverse reactions/side effects for all the Medicines you take and that have been prescribed to you. Take any new Medicines after you have completely understood and accpet all the possible adverse reactions/side effects.   Do not drive, operating heavy machinery, perform activities at heights, swimming or participation in water activities or provide baby sitting services if your were admitted for syncope or siezures until you have seen by Primary MD or a Neurologist and advised to do so again.  Do not drive when taking Pain medications.    Do not take more than prescribed Pain, Sleep and Anxiety Medications  Special Instructions: If you have smoked or chewed Tobacco  in the last 2 yrs please stop smoking, stop any regular Alcohol  and or any Recreational drug use.  Wear Seat belts while driving.   Please note  You were cared for by a hospitalist during your hospital stay. If you have any questions about your discharge medications or the care you received while you were in the hospital after you are discharged, you can call the unit and asked to speak with the hospitalist on call if the hospitalist that took care of you is not available. Once you are discharged, your primary care physician will handle any further medical issues. Please note that NO REFILLS for any discharge medications will be authorized once you are discharged, as it is imperative that you return to your primary care physician (or establish a relationship with a primary care physician if you do not have one) for your aftercare needs so that they can reassess your need for medications and monitor your lab values.   Increase activity slowly    Complete by:  As directed      Allergies as of 12/17/2015      Reactions   Indomethacin Anaphylaxis, Other (See Comments)    "took it fine for awhile; one day I stopped breathing and I ended up in the hospital" (01/02/2012) Pt has tolerated aspirin      Medication List    TAKE these medications   acetaminophen 500 MG tablet Commonly known as:  TYLENOL Take 1,000 mg by mouth every 6 (six) hours as needed for mild pain. Reported on 07/14/2015   albuterol (2.5 MG/3ML) 0.083% nebulizer solution Commonly known as:  PROVENTIL Take 3 mLs (2.5 mg total) by  nebulization every 6 (six) hours as needed for wheezing or shortness of breath. What changed:  You were already taking a medication with the same name, and this prescription was added. Make sure you understand how and when to take each.   albuterol 108 (90 Base) MCG/ACT inhaler Commonly known as:  PROVENTIL HFA;VENTOLIN HFA Inhale 2 puffs into the lungs every 6 (six) hours as needed for wheezing. What changed:  Another medication with the same name was added. Make sure you understand how and when to take each.   apixaban 5 MG Tabs tablet Commonly known as:  ELIQUIS Take 1 tablet (5 mg total) by mouth 2 (two) times daily.   atorvastatin 40 MG tablet Commonly known as:  LIPITOR Take 1 tablet (40 mg total) by mouth daily at 6 PM.   cholecalciferol 1000 units tablet Commonly known as:  VITAMIN D Take 1,000 Units by mouth daily.   ferrous sulfate 325 (65 FE) MG tablet Take 325 mg by mouth daily as needed (iron supplement).   furosemide 40 MG tablet Commonly known as:  LASIX Take 1 tablet (40 mg total) by mouth daily. Start taking on:  12/18/2015 What changed:  medication strength  how much to take   lisinopril 20 MG tablet Commonly known as:  PRINIVIL,ZESTRIL Take 1 tablet (20 mg total) by mouth daily.   multivitamin-prenatal 27-0.8 MG Tabs tablet Take 1 tablet by mouth daily at 12 noon.   polyethylene glycol packet Commonly known as:  MIRALAX / GLYCOLAX Take 17 g by mouth daily.   polyvinyl alcohol 1.4 % ophthalmic solution Commonly known as:   LIQUIFILM TEARS Place 1 drop into both eyes as needed for dry eyes.   predniSONE 10 MG tablet Commonly known as:  DELTASONE Please take 40 mg oral daily for 2 days, then 30 mg oral daily for 2 days, then 20 mg oral daily for 2 days, then 10 mg oral daily for 2 days, then stop   SYMBICORT IN Inhale 2 puffs into the lungs 2 (two) times daily.            Durable Medical Equipment        Start     Ordered   12/17/15 [phone]  For home use only DME Nebulizer machine  Once    Question:  Patient needs a nebulizer to treat with the following condition  Answer:  COPD (chronic obstructive pulmonary disease) ([place])   12/17/15 0930        Diet and Activity recommendation: See Discharge Instructions above   Consults obtained -  none   Major procedures and Radiology Reports - PLEASE review detailed and final reports for all details, in brief -      Dg Chest 2 View  Result Date: 12/15/2015 CLINICAL DATA:  Increasing shortness of breath and congestion. EXAM: CHEST  2 VIEW COMPARISON:  11/08/2015 and prior exams FINDINGS: Cardiomegaly noted. New interstitial opacities likely represent edema. Trace bilateral pleural effusions and minimal bibasilar atelectasis noted. There is no evidence of pneumothorax. No acute bony abnormalities are identified. IMPRESSION: Cardiomegaly with interstitial pulmonary edema and trace bilateral pleural effusions. Electronically Signed   By: Margarette Canada M.D.   On: 12/15/2015 11:10    Micro Results     No results found for this or any previous visit (from the past 240 hour(s)).     Today   Subjective:   [name] today has no headache,no chest abdominal pain,no new weakness tingling or numbness, feels much better wants  to go home today.  Objective:   Blood pressure (!) 147/60, pulse 65, temperature 98 F (36.7 C), temperature source Oral, resp. rate 18, height 5\' 4"  (1.626 m), weight 70.6 kg (155 lb 9.6 oz), SpO2 98 %.   Intake/Output Summary  (Last 24 hours) at 12/17/15 1211 Last data filed at 12/17/15 1004  Gross per 24 hour  Intake              883 ml  Output             2300 ml  Net            -1417 ml    Exam  General exam: Pleasant elderly female sitting up comfortably in chair this morning. Respiratory system: Good breath sounds bilaterally. Occasional basal crackles and occasional rhonchi posteriorly. Respiratory effort normal. Cardiovascular system: S1 & S2 heard, RRR. No JVD, murmurs, rubs, gallops or clicks. No pedal edema. Telemetry: Sinus rhythm. Gastrointestinal system: Abdomen is nondistended, soft and nontender. No organomegaly or masses felt. Normal bowel sounds heard. Central nervous system: Alert and oriented. No focal neurological deficits. Extremities: Symmetric 5 x 5 power. Skin: No rashes, lesions or ulcers Psychiatry: Judgement and insight appear normal. Mood & affect appropriate.  Data Review   CBC w Diff: Lab Results  Component Value Date   WBC 5.1 12/15/2015   HGB 10.6 (L) 12/15/2015   HGB 11.7 09/02/2014   HCT 31.4 (L) 12/15/2015   HCT 34.9 09/02/2014   PLT 205 12/15/2015   PLT 256 09/02/2014   LYMPHOPCT 8 12/15/2015   LYMPHOPCT 16.1 09/02/2014   MONOPCT 7 12/15/2015   MONOPCT 8.9 09/02/2014   EOSPCT 2 12/15/2015   EOSPCT 4.8 09/02/2014   BASOPCT 0 12/15/2015   BASOPCT 1.0 09/02/2014    CMP: Lab Results  Component Value Date   NA 137 12/17/2015   NA 144 09/02/2014   K 4.5 12/17/2015   K 4.2 09/02/2014   CL 100 (L) 12/17/2015   CO2 29 12/17/2015   CO2 29 09/02/2014   BUN 28 (H) 12/17/2015   BUN 21.8 09/02/2014   CREATININE 1.10 (H) 12/17/2015   CREATININE 0.75 05/09/2015   CREATININE 1.1 09/02/2014   PROT 6.6 04/11/2015   PROT 7.2 09/02/2014   ALBUMIN 3.7 04/11/2015   ALBUMIN 3.9 09/02/2014   BILITOT 0.8 04/11/2015   BILITOT 0.52 09/02/2014   ALKPHOS 61 04/11/2015   ALKPHOS 86 09/02/2014   AST 18 04/11/2015   AST 15 09/02/2014   ALT 12 (L) 04/11/2015   ALT 10  09/02/2014  .   Total Time in preparing paper work, data evaluation and todays exam - 35 minutes  ELGERGAWY, DAWOOD M.D on 12/17/2015 at 12:11 PM  [facility]   Office  [phone]

## 2015-12-17 NOTE — Progress Notes (Signed)
Patient is discharge to home accompanied by family member and NT via wheelchair. Prescriptions and discharge instructions given . Patient verbalizes understanding. All personal belongings given. Telemetry box and IV removed prior to discharge and site in good condition.

## 2015-12-28 ENCOUNTER — Encounter (HOSPITAL_COMMUNITY): Payer: Self-pay | Admitting: Emergency Medicine

## 2015-12-28 ENCOUNTER — Inpatient Hospital Stay (HOSPITAL_COMMUNITY)
Admission: EM | Admit: 2015-12-28 | Discharge: 2016-01-01 | DRG: 871 | Disposition: A | Discharge status: Home / Self Care | Payer: Medicare Other | Attending: Internal Medicine | Admitting: Internal Medicine

## 2015-12-28 ENCOUNTER — Emergency Department (HOSPITAL_COMMUNITY): Payer: Medicare Other

## 2015-12-28 DIAGNOSIS — E860 Dehydration: Secondary | ICD-10-CM | POA: Diagnosis present

## 2015-12-28 DIAGNOSIS — Z9981 Dependence on supplemental oxygen: Secondary | ICD-10-CM | POA: Diagnosis not present

## 2015-12-28 DIAGNOSIS — Z87891 Personal history of nicotine dependence: Secondary | ICD-10-CM

## 2015-12-28 DIAGNOSIS — N179 Acute kidney failure, unspecified: Secondary | ICD-10-CM | POA: Diagnosis present

## 2015-12-28 DIAGNOSIS — R7989 Other specified abnormal findings of blood chemistry: Secondary | ICD-10-CM

## 2015-12-28 DIAGNOSIS — Z79899 Other long term (current) drug therapy: Secondary | ICD-10-CM

## 2015-12-28 DIAGNOSIS — Y95 Nosocomial condition: Secondary | ICD-10-CM | POA: Diagnosis not present

## 2015-12-28 DIAGNOSIS — I5022 Chronic systolic (congestive) heart failure: Secondary | ICD-10-CM | POA: Diagnosis present

## 2015-12-28 DIAGNOSIS — J189 Pneumonia, unspecified organism: Secondary | ICD-10-CM | POA: Diagnosis not present

## 2015-12-28 DIAGNOSIS — I63412 Cerebral infarction due to embolism of left middle cerebral artery: Secondary | ICD-10-CM

## 2015-12-28 DIAGNOSIS — E785 Hyperlipidemia, unspecified: Secondary | ICD-10-CM | POA: Diagnosis not present

## 2015-12-28 DIAGNOSIS — J440 Chronic obstructive pulmonary disease with acute lower respiratory infection: Secondary | ICD-10-CM | POA: Diagnosis not present

## 2015-12-28 DIAGNOSIS — I472 Ventricular tachycardia: Secondary | ICD-10-CM | POA: Diagnosis present

## 2015-12-28 DIAGNOSIS — Z7982 Long term (current) use of aspirin: Secondary | ICD-10-CM

## 2015-12-28 DIAGNOSIS — R079 Chest pain, unspecified: Secondary | ICD-10-CM

## 2015-12-28 DIAGNOSIS — J9600 Acute respiratory failure, unspecified whether with hypoxia or hypercapnia: Secondary | ICD-10-CM | POA: Diagnosis present

## 2015-12-28 DIAGNOSIS — I248 Other forms of acute ischemic heart disease: Secondary | ICD-10-CM | POA: Diagnosis present

## 2015-12-28 DIAGNOSIS — D649 Anemia, unspecified: Secondary | ICD-10-CM | POA: Diagnosis not present

## 2015-12-28 DIAGNOSIS — Z90710 Acquired absence of both cervix and uterus: Secondary | ICD-10-CM

## 2015-12-28 DIAGNOSIS — J208 Acute bronchitis due to other specified organisms: Secondary | ICD-10-CM | POA: Diagnosis present

## 2015-12-28 DIAGNOSIS — Z7951 Long term (current) use of inhaled steroids: Secondary | ICD-10-CM

## 2015-12-28 DIAGNOSIS — J439 Emphysema, unspecified: Secondary | ICD-10-CM

## 2015-12-28 DIAGNOSIS — Z8673 Personal history of transient ischemic attack (TIA), and cerebral infarction without residual deficits: Secondary | ICD-10-CM

## 2015-12-28 DIAGNOSIS — Z9841 Cataract extraction status, right eye: Secondary | ICD-10-CM

## 2015-12-28 DIAGNOSIS — Z9842 Cataract extraction status, left eye: Secondary | ICD-10-CM

## 2015-12-28 DIAGNOSIS — J9621 Acute and chronic respiratory failure with hypoxia: Secondary | ICD-10-CM | POA: Diagnosis not present

## 2015-12-28 DIAGNOSIS — Z66 Do not resuscitate: Secondary | ICD-10-CM | POA: Diagnosis present

## 2015-12-28 DIAGNOSIS — R0902 Hypoxemia: Secondary | ICD-10-CM

## 2015-12-28 DIAGNOSIS — J449 Chronic obstructive pulmonary disease, unspecified: Secondary | ICD-10-CM | POA: Diagnosis present

## 2015-12-28 DIAGNOSIS — I110 Hypertensive heart disease with heart failure: Secondary | ICD-10-CM | POA: Diagnosis present

## 2015-12-28 DIAGNOSIS — Z8709 Personal history of other diseases of the respiratory system: Secondary | ICD-10-CM

## 2015-12-28 DIAGNOSIS — K219 Gastro-esophageal reflux disease without esophagitis: Secondary | ICD-10-CM | POA: Diagnosis present

## 2015-12-28 DIAGNOSIS — Z7901 Long term (current) use of anticoagulants: Secondary | ICD-10-CM

## 2015-12-28 DIAGNOSIS — A419 Sepsis, unspecified organism: Secondary | ICD-10-CM | POA: Diagnosis present

## 2015-12-28 DIAGNOSIS — Z961 Presence of intraocular lens: Secondary | ICD-10-CM | POA: Diagnosis present

## 2015-12-28 DIAGNOSIS — R778 Other specified abnormalities of plasma proteins: Secondary | ICD-10-CM | POA: Diagnosis present

## 2015-12-28 DIAGNOSIS — R509 Fever, unspecified: Secondary | ICD-10-CM

## 2015-12-28 DIAGNOSIS — Z888 Allergy status to other drugs, medicaments and biological substances status: Secondary | ICD-10-CM

## 2015-12-28 DIAGNOSIS — I480 Paroxysmal atrial fibrillation: Secondary | ICD-10-CM | POA: Diagnosis present

## 2015-12-28 DIAGNOSIS — I10 Essential (primary) hypertension: Secondary | ICD-10-CM | POA: Diagnosis present

## 2015-12-28 HISTORY — DX: Paroxysmal atrial fibrillation: I48.0

## 2015-12-28 LAB — CBC WITH DIFFERENTIAL/PLATELET
BASOS PCT: 0 %
Basophils Absolute: 0 10*3/uL (ref 0.0–0.1)
EOS ABS: 0.1 10*3/uL (ref 0.0–0.7)
EOS PCT: 0 %
HCT: 29.9 % — ABNORMAL LOW (ref 36.0–46.0)
Hemoglobin: 9.8 g/dL — ABNORMAL LOW (ref 12.0–15.0)
LYMPHS ABS: 0.4 10*3/uL — AB (ref 0.7–4.0)
Lymphocytes Relative: 3 %
MCH: 30 pg (ref 26.0–34.0)
MCHC: 32.8 g/dL (ref 30.0–36.0)
MCV: 91.4 fL (ref 78.0–100.0)
MONO ABS: 1.4 10*3/uL — AB (ref 0.1–1.0)
MONOS PCT: 10 %
Neutro Abs: 12.8 10*3/uL — ABNORMAL HIGH (ref 1.7–7.7)
Neutrophils Relative %: 87 %
PLATELETS: 211 10*3/uL (ref 150–400)
RBC: 3.27 MIL/uL — ABNORMAL LOW (ref 3.87–5.11)
RDW: 13.4 % (ref 11.5–15.5)
WBC: 14.7 10*3/uL — ABNORMAL HIGH (ref 4.0–10.5)

## 2015-12-28 LAB — URINALYSIS, ROUTINE W REFLEX MICROSCOPIC
Bilirubin Urine: NEGATIVE
GLUCOSE, UA: NEGATIVE mg/dL
Hgb urine dipstick: NEGATIVE
KETONES UR: NEGATIVE mg/dL
Nitrite: NEGATIVE
PROTEIN: 100 mg/dL — AB
Specific Gravity, Urine: 1.025 (ref 1.005–1.030)
pH: 6 (ref 5.0–8.0)

## 2015-12-28 LAB — CREATININE, URINE, RANDOM: Creatinine, Urine: 187.87 mg/dL

## 2015-12-28 LAB — BASIC METABOLIC PANEL
Anion gap: 11 (ref 5–15)
BUN: 20 mg/dL (ref 6–20)
CALCIUM: 8.5 mg/dL — AB (ref 8.9–10.3)
CHLORIDE: 100 mmol/L — AB (ref 101–111)
CO2: 26 mmol/L (ref 22–32)
CREATININE: 1.12 mg/dL — AB (ref 0.44–1.00)
GFR calc Af Amer: 49 mL/min — ABNORMAL LOW (ref >60)
GFR, EST NON AFRICAN AMERICAN: 43 mL/min — AB (ref >60)
Glucose, Bld: 124 mg/dL — ABNORMAL HIGH (ref 65–99)
Potassium: 3.9 mmol/L (ref 3.5–5.1)
SODIUM: 137 mmol/L (ref 135–145)

## 2015-12-28 LAB — TROPONIN I: TROPONIN I: 0.07 ng/mL — AB (ref <0.03)

## 2015-12-28 LAB — I-STAT CG4 LACTIC ACID, ED: Lactic Acid, Venous: 0.98 mmol/L (ref 0.5–1.9)

## 2015-12-28 LAB — LACTIC ACID, PLASMA: Lactic Acid, Venous: 1 mmol/L (ref 0.5–1.9)

## 2015-12-28 LAB — BRAIN NATRIURETIC PEPTIDE: B NATRIURETIC PEPTIDE 5: 300 pg/mL — AB (ref 0.0–100.0)

## 2015-12-28 MED ORDER — FERROUS SULFATE 325 (65 FE) MG PO TABS: 325.0000 mg | ORAL_TABLET | Freq: Every day | ORAL | Status: DC | PRN

## 2015-12-28 MED ORDER — DEXTROSE 5 % IV SOLN
1.0000 g | INTRAVENOUS | Status: DC
  Administered 2015-12-29: 1 g via INTRAVENOUS
  Filled 2015-12-28 (×2): qty 1

## 2015-12-28 MED ORDER — IPRATROPIUM-ALBUTEROL 0.5-2.5 (3) MG/3ML IN SOLN
3.0000 mL | RESPIRATORY_TRACT | Status: DC
  Administered 2015-12-28: 3 mL via RESPIRATORY_TRACT
  Filled 2015-12-28: qty 3

## 2015-12-28 MED ORDER — DEXTROSE 5 % IV SOLN
2.0000 g | Freq: Once | INTRAVENOUS | Status: AC
  Administered 2015-12-28: 2 g via INTRAVENOUS
  Filled 2015-12-28: qty 2

## 2015-12-28 MED ORDER — PRENATAL PLUS 27-1 MG PO TABS
1.0000 | ORAL_TABLET | Freq: Every day | ORAL | Status: DC
  Administered 2015-12-29 – 2016-01-01 (×4): 1 via ORAL
  Filled 2015-12-28 (×4): qty 1

## 2015-12-28 MED ORDER — VANCOMYCIN HCL IN DEXTROSE 1-5 GM/200ML-% IV SOLN
1000.0000 mg | INTRAVENOUS | Status: DC
  Administered 2015-12-29: 1000 mg via INTRAVENOUS
  Filled 2015-12-28 (×2): qty 200

## 2015-12-28 MED ORDER — VITAMIN D 1000 UNITS PO TABS
1000.0000 [IU] | ORAL_TABLET | Freq: Every day | ORAL | Status: DC
  Administered 2015-12-29 – 2016-01-01 (×4): 1000 [IU] via ORAL
  Filled 2015-12-28 (×4): qty 1

## 2015-12-28 MED ORDER — ACETAMINOPHEN 500 MG PO TABS: 1000.0000 mg | ORAL_TABLET | Freq: Four times a day (QID) | ORAL | Status: DC | PRN

## 2015-12-28 MED ORDER — ASPIRIN 325 MG PO TABS: 325.0000 mg | ORAL_TABLET | ORAL | Status: DC | PRN

## 2015-12-28 MED ORDER — ACETAMINOPHEN 325 MG PO TABS: 650.0000 mg | ORAL_TABLET | Freq: Four times a day (QID) | ORAL | Status: DC | PRN

## 2015-12-28 MED ORDER — ALBUTEROL SULFATE (2.5 MG/3ML) 0.083% IN NEBU
2.5000 mg | INHALATION_SOLUTION | RESPIRATORY_TRACT | Status: DC | PRN
  Administered 2015-12-31: 2.5 mg via RESPIRATORY_TRACT
  Filled 2015-12-28: qty 3

## 2015-12-28 MED ORDER — ZOLPIDEM TARTRATE 5 MG PO TABS
5.0000 mg | ORAL_TABLET | Freq: Every evening | ORAL | Status: DC | PRN
  Administered 2015-12-29 – 2015-12-31 (×3): 5 mg via ORAL
  Filled 2015-12-28 (×3): qty 1

## 2015-12-28 MED ORDER — DM-GUAIFENESIN ER 30-600 MG PO TB12
1.0000 | ORAL_TABLET | Freq: Two times a day (BID) | ORAL | Status: DC
  Administered 2015-12-29 – 2015-12-31 (×7): 1 via ORAL
  Filled 2015-12-28 (×8): qty 1

## 2015-12-28 MED ORDER — POLYVINYL ALCOHOL 1.4 % OP SOLN: 1.0000 [drp] | OPHTHALMIC | Status: DC | PRN

## 2015-12-28 MED ORDER — ONDANSETRON HCL 4 MG/2ML IJ SOLN: 4.0000 mg | Freq: Three times a day (TID) | INTRAMUSCULAR | Status: DC | PRN

## 2015-12-28 MED ORDER — HYDRALAZINE HCL 20 MG/ML IJ SOLN: 5.0000 mg | INTRAMUSCULAR | Status: DC | PRN

## 2015-12-28 MED ORDER — SODIUM CHLORIDE 0.9 % IV SOLN
INTRAVENOUS | Status: DC
  Administered 2015-12-29: via INTRAVENOUS

## 2015-12-28 MED ORDER — IPRATROPIUM-ALBUTEROL 0.5-2.5 (3) MG/3ML IN SOLN
3.0000 mL | Freq: Three times a day (TID) | RESPIRATORY_TRACT | Status: DC
  Administered 2015-12-29 – 2016-01-01 (×9): 3 mL via RESPIRATORY_TRACT
  Filled 2015-12-28 (×10): qty 3

## 2015-12-28 MED ORDER — APIXABAN 5 MG PO TABS
5.0000 mg | ORAL_TABLET | Freq: Two times a day (BID) | ORAL | Status: DC
  Administered 2015-12-29 – 2016-01-01 (×8): 5 mg via ORAL
  Filled 2015-12-28 (×8): qty 1

## 2015-12-28 MED ORDER — SODIUM CHLORIDE 0.9 % IV SOLN: INTRAVENOUS | Status: DC

## 2015-12-28 MED ORDER — VANCOMYCIN HCL 10 G IV SOLR
1500.0000 mg | Freq: Once | INTRAVENOUS | Status: AC
  Administered 2015-12-28: 1500 mg via INTRAVENOUS
  Filled 2015-12-28: qty 1500

## 2015-12-28 MED ORDER — POLYETHYLENE GLYCOL 3350 17 G PO PACK
17.0000 g | PACK | Freq: Every day | ORAL | Status: DC
  Administered 2015-12-29 – 2016-01-01 (×3): 17 g via ORAL
  Filled 2015-12-28 (×4): qty 1

## 2015-12-28 NOTE — ED Triage Notes (Signed)
Per EMS: Pt was giving herself a breathing treatment and she started to have a sharp pain on the left side of her chest. Pt has a hx of CHF, COPD, and Pneumonia for the last month. Pt on 2L [place] SPO2 98% 132/56 100 F 79 HR 24 RR  Pt currently not c/o chest pain

## 2015-12-28 NOTE — ED Provider Notes (Signed)
[facility] DEPT Provider Note   CSN: [id number] Arrival date & time: 12/28/15  1801     History   Chief Complaint Chief Complaint  Patient presents with  . Chest Pain    HPI [name] is a 82 y.o. female.  The history is provided by the patient, a relative and medical records. No language interpreter was used.  Chest Pain   This is a new problem. The current episode started less than 1 hour ago. The problem has been resolved. The pain is associated with rest (and while bending over to pick something up). The pain is present in the lateral region (L side under breast). The quality of the pain is described as sharp. The pain radiates to the upper back. Duration of episode(s) is 15 minutes. The symptoms are aggravated by certain positions. Associated symptoms include cough, a fever (states she has had subjective fever/chills), shortness of breath (worsened today) and sputum production (increasing). Pertinent negatives include no abdominal pain, no diaphoresis, no dizziness, no lower extremity edema, no nausea, no numbness and no palpitations. She has tried nothing for the symptoms. Risk factors include obesity.  Her past medical history is significant for COPD, CHF and hypertension.    Past Medical History:  Diagnosis Date  . Anemia    years ago  . Arthritis    "right shoulder" (01/02/2012)  . Bladder mass   . CAP (community acquired pneumonia) 01/02/2012   Lower lobe   . CHF (congestive heart failure) ([place])   . COPD (chronic obstructive pulmonary disease) ([place])   . Difficulty sleeping   . Diverticulitis of colon with perforation 01/14/2012   That is post sigmoid resection with [facility] pouch and end colostomy   . GERD (gastroesophageal reflux disease)    occasional  . Goiter    radioactive iodine ablation/notes 01/02/2012  . History of kidney stones    X 1  . Hypertension   . Hypothyroidism    "I have taken Synthroid before" (01/02/2012)   HAS HAD RADIOACTIVE IODINE TX 2 YR  S AGO  . Intra-abdominal abscess ([place]) 01/14/2012  . PAF (paroxysmal atrial fibrillation) ([place])   . Pneumatosis of intestines 01/14/2012  . Pneumonia 01/02/2012; ? 2009  . Radiation 08/10/13-09/15/13   57 gray to vaginal/bladder mass  . SOB (shortness of breath)    'all the time" (01/02/2012)  . Stroke ([place])   . UTI (lower urinary tract infection) 01/03/2012    Patient Active Problem List   Diagnosis Date Noted  . Chest pain 12/28/2015  . Acute on chronic respiratory failure with hypoxia ([place]) 12/28/2015  . Acute respiratory failure ([place]) 12/28/2015  . AKI (acute kidney injury) ([place]) 12/28/2015  . Respiratory distress 12/15/2015  . Chronic respiratory failure ([place]) 12/15/2015  . PAF (paroxysmal atrial fibrillation) ([place]) 08/27/2015  . Essential hypertension 06/15/2015  . Squamous cell carcinoma, metastatic ([place]) 06/15/2015  . Acute CVA (cerebrovascular accident) ([place]) 04/13/2015  . Cerebrovascular accident (CVA) due to embolism of left middle cerebral artery ([place])   . HLD (hyperlipidemia)   . Cerebral thrombosis with cerebral infarction 04/12/2015  . Stroke-like symptoms   . Dysarthria 04/11/2015  . Absolute anemia 09/04/2014  . Elevated troponin 02/14/2014  . Dyspnea   . CAP (community acquired pneumonia) 02/13/2014  . Overweight (BMI 25.0-29.9) 02/13/2014  . Goiter 02/13/2014  . Chronic systolic CHF (congestive heart failure) ([place]) 02/13/2014  . Sepsis ([place]) 02/13/2014  . Squamous cell carcinoma of unknown origin ([place]) 07/24/2013  . S/P colostomy takedown 10/21/2012  10/23/2012  . Diverticulosis of colon 03/07/2012  . Pulmonary nodules 01/23/2012  . Hematuria, microscopic 01/14/2012  . Hyponatremia 01/14/2012  . Asymptomatic PVCs 01/03/2012  . H/O: hypothyroidism 01/03/2012  . COPD (chronic obstructive pulmonary disease) ([place]) 01/02/2012  . Hypertension 01/02/2012  . Chronic combined systolic and diastolic congestive heart failure ([place]) 01/02/2012  . Hx of goiter 01/02/2012     Past Surgical History:  Procedure Laterality Date  . ABDOMINAL HYSTERECTOMY  1980's  . APPENDECTOMY  1980's   "when they did hysterectomy" (01/02/2012)  . CATARACT EXTRACTION W/ INTRAOCULAR LENS  IMPLANT, BILATERAL  ?1990's   [place]  . COLON SURGERY    . COLOSTOMY N/A 03/12/2012   Procedure: COLOSTOMY;  Surgeon: Ralene Ok, MD;  Location: [facility];  Service: General;  Laterality: N/A;  . COLOSTOMY REVISION N/A 03/12/2012   Procedure: COLON RESECTION SIGMOID ;  Surgeon: Ralene Ok, MD;  Location: [facility];  Service: General;  Laterality: N/A;  . COLOSTOMY TAKEDOWN N/A 10/21/2012   Procedure: LAPAROSCOPIC COLOSTOMY TAKEDOWN AND HARTMANS ANASTOMOSIS, rigid proctoscopy;  Surgeon: Ralene Ok, MD;  Location: [facility];  Service: General;  Laterality: N/A;  . EYE SURGERY    . LYSIS OF ADHESION N/A 10/21/2012   Procedure: LYSIS OF ADHESION;  Surgeon: Ralene Ok, MD;  Location: [facility];  Service: General;  Laterality: N/A;  . TRANSURETHRAL RESECTION OF BLADDER TUMOR WITH GYRUS (TURBT-GYRUS) N/A 06/30/2013   Procedure: TRANSURETHRAL RESECTION OF BLADDER TUMOR WITH GYRUS (TURBT-GYRUS)/VAGINAL BIOPSY;  Surgeon: Ardis Hughs, MD;  Location: [facility];  Service: Urology;  Laterality: N/A;    OB History    No data available       Home Medications    Prior to Admission medications   Medication Sig Start Date End Date Taking? Authorizing Provider  acetaminophen (TYLENOL) 500 MG tablet Take 1,000 mg by mouth every 6 (six) hours as needed for mild pain. Reported on 07/14/2015   Yes Historical Provider, MD  albuterol (PROVENTIL HFA;VENTOLIN HFA) 108 (90 BASE) MCG/ACT inhaler Inhale 2 puffs into the lungs every 6 (six) hours as needed for wheezing.   Yes Historical Provider, MD  albuterol (PROVENTIL) (2.5 MG/3ML) 0.083% nebulizer solution Take 3 mLs (2.5 mg total) by nebulization every 6 (six) hours as needed for wheezing or shortness of breath. 12/17/15  Yes Albertine Patricia, MD  apixaban  (ELIQUIS) 5 MG TABS tablet Take 1 tablet (5 mg total) by mouth 2 (two) times daily. 08/28/15  Yes Shanker Kristeen Mans, MD  aspirin 325 MG tablet Take 325 mg by mouth every 4 (four) hours as needed for mild pain.   Yes Historical Provider, MD  Budesonide-Formoterol Fumarate (SYMBICORT IN) Inhale 2 puffs into the lungs 2 (two) times daily.   Yes Historical Provider, MD  cholecalciferol (VITAMIN D) 1000 units tablet Take 1,000 Units by mouth daily.   Yes Historical Provider, MD  ferrous sulfate 325 (65 FE) MG tablet Take 325 mg by mouth daily as needed (iron supplement).   Yes Historical Provider, MD  furosemide (LASIX) 40 MG tablet Take 1 tablet (40 mg total) by mouth daily. 12/18/15  Yes Silver Huguenin Elgergawy, MD  lisinopril (PRINIVIL,ZESTRIL) 20 MG tablet Take 1 tablet (20 mg total) by mouth daily. 05/27/15  Yes Thayer Headings, MD  polyethylene glycol [facility] / Floria Raveling) packet Take 17 g by mouth daily.   Yes Historical Provider, MD  polyvinyl alcohol (LIQUIFILM TEARS) 1.4 % ophthalmic solution Place 1 drop into both eyes as needed for dry eyes.  Yes Historical Provider, MD  Prenatal Vit-Fe Fumarate-FA (MULTIVITAMIN-PRENATAL) 27-0.8 MG TABS tablet Take 1 tablet by mouth daily at 12 noon.   Yes Historical Provider, MD  atorvastatin (LIPITOR) 40 MG tablet Take 1 tablet (40 mg total) by mouth daily at 6 PM. Patient not taking: Reported on 12/28/2015 05/27/15   Thayer Headings, MD  predniSONE (DELTASONE) 10 MG tablet Please take 40 mg oral daily for 2 days, then 30 mg oral daily for 2 days, then 20 mg oral daily for 2 days, then 10 mg oral daily for 2 days, then stop Patient not taking: Reported on 12/28/2015 12/17/15   Albertine Patricia, MD    Family History Family History  Problem Relation Age of Onset  . Cancer Sister     Breast  . Stroke Sister   . Heart disease Mother   . Heart disease      Social History Social History  Substance Use Topics  . Smoking status: Former Smoker    Packs/day:  0.12    Years: 35.00    Types: Cigarettes    Quit date: 01/02/1991  . Smokeless tobacco: Never Used  . Alcohol use No     Allergies   Indomethacin   Review of Systems Review of Systems  Constitutional: Positive for fever (states she has had subjective fever/chills). Negative for diaphoresis.  HENT: Positive for congestion. Negative for rhinorrhea.   Respiratory: Positive for cough, sputum production (increasing) and shortness of breath (worsened today).   Cardiovascular: Positive for chest pain (describes as sharp). Negative for palpitations.  Gastrointestinal: Negative for abdominal pain and nausea.  Genitourinary: Negative for dysuria and hematuria.  Skin: Negative for pallor and rash.  Neurological: Negative for dizziness and numbness.  Psychiatric/Behavioral: Negative for agitation and confusion.  All other systems reviewed and are negative.    Physical Exam Updated Vital Signs BP (!) 159/69 (BP Location: Right Arm)   Pulse 87   Temp 98.8 F (37.1 C) (Oral)   Resp (!) 27   Ht 5' (1.524 m)   Wt 71.7 kg   SpO2 98%   BMI 30.86 kg/m   Physical Exam  Constitutional: She is oriented to person, place, and time. No distress.  Elderly appearing  HENT:  Head: Normocephalic and atraumatic.  Eyes: Right conjunctiva is not injected. Left conjunctiva is injected.  Neck: Normal range of motion. Neck supple.  Cardiovascular: Normal rate and regular rhythm.   Pulmonary/Chest: She is in respiratory distress (mild, still speaking in full sentences). She has no wheezes. She has no rales. She exhibits no tenderness.  Abdominal: Soft. She exhibits no distension. There is no tenderness.  Musculoskeletal: Normal range of motion. She exhibits edema (mild bilateral lower extremity pitting edema).  Neurological: She is alert and oriented to person, place, and time.  Skin: Skin is warm and dry. She is not diaphoretic.  Psychiatric: She has a normal mood and affect. Her behavior is normal.      ED Treatments / Results  Labs (all labs ordered are listed, but only abnormal results are displayed) Labs Reviewed  CBC WITH DIFFERENTIAL/PLATELET - Abnormal; Notable for the following:       Result Value   WBC 14.7 (*)    RBC 3.27 (*)    Hemoglobin 9.8 (*)    HCT 29.9 (*)    Neutro Abs 12.8 (*)    Lymphs Abs 0.4 (*)    Monocytes Absolute 1.4 (*)    All other components within normal limits  BASIC METABOLIC PANEL - Abnormal; Notable for the following:    Chloride 100 (*)    Glucose, Bld 124 (*)    Creatinine, Ser 1.12 (*)    Calcium 8.5 (*)    GFR calc non Af Amer 43 (*)    GFR calc Af Amer 49 (*)    All other components within normal limits  BRAIN NATRIURETIC PEPTIDE - Abnormal; Notable for the following:    B Natriuretic Peptide 300.0 (*)    All other components within normal limits  URINALYSIS, ROUTINE W REFLEX MICROSCOPIC - Abnormal; Notable for the following:    APPearance CLOUDY (*)    Protein, ur 100 (*)    Leukocytes, UA LARGE (*)    Bacteria, UA RARE (*)    Squamous Epithelial / LPF 0-5 (*)    All other components within normal limits  TROPONIN I - Abnormal; Notable for the following:    Troponin I 0.07 (*)    All other components within normal limits  CULTURE, BLOOD (ROUTINE X 2)  CULTURE, BLOOD (ROUTINE X 2)  RESPIRATORY PANEL BY PCR  CULTURE, EXPECTORATED SPUTUM-ASSESSMENT  GRAM STAIN  CREATININE, URINE, RANDOM  LACTIC ACID, PLASMA  UREA NITROGEN, URINE  TROPONIN I  TROPONIN I  INFLUENZA PANEL BY PCR (TYPE A & B, H1N1)  LACTIC ACID, PLASMA  PROCALCITONIN  STREP PNEUMONIAE URINARY ANTIGEN  LEGIONELLA PNEUMOPHILA SEROGP 1 UR AG  I-STAT CG4 LACTIC ACID, ED  I-STAT CG4 LACTIC ACID, ED    EKG  EKG Interpretation  Date/Time:  Wednesday December 28 2015 18:30:28 EST Ventricular Rate:  72 PR Interval:    QRS Duration: 138 QT Interval:  373 QTC Calculation: 409 R Axis:   -46 Text Interpretation:  Sinus rhythm Multiple ventricular premature  complexes Left bundle branch block T wave inversions in inferior leads more pronounced than previous Confirmed by LITTLE MD, RACHEL [id number]) on 12/28/2015 7:51:22 PM       Radiology Dg Chest Portable 1 View  Result Date: 12/28/2015 CLINICAL DATA:  Central chest pain, shortness of breath, cough EXAM: PORTABLE CHEST 1 VIEW COMPARISON:  None. FINDINGS: There is hyperinflation of the lungs compatible with COPD. Heart is upper limits normal in size. Mild peribronchial thickening and interstitial prominence, likely chronic bronchitis. No confluent opacities or effusions. Previously seen interstitial edema has improved/resolved. IMPRESSION: COPD/chronic bronchitic changes. Electronically Signed   By: Rolm Baptise M.D.   On: 12/28/2015 19:27    Procedures Procedures (including critical care time)  Medications Ordered in ED Medications  vancomycin (VANCOCIN) IVPB 1000 mg/200 mL premix (not administered)  aspirin tablet 325 mg (not administered)  albuterol (PROVENTIL) (2.5 MG/3ML) 0.083% nebulizer solution 2.5 mg (not administered)  cholecalciferol (VITAMIN D) tablet 1,000 Units (not administered)  ferrous sulfate tablet 325 mg (not administered)  polyethylene glycol (MIRALAX / GLYCOLAX) packet 17 g (not administered)  prenatal vitamin w/FE, FA (PRENATAL 1 + 1) 27-1 MG tablet 1 tablet (not administered)  apixaban (ELIQUIS) tablet 5 mg (not administered)  polyvinyl alcohol (LIQUIFILM TEARS) 1.4 % ophthalmic solution 1 drop (not administered)  hydrALAZINE (APRESOLINE) injection 5 mg (not administered)  0.9 %  sodium chloride infusion (not administered)  dextromethorphan-guaiFENesin (MUCINEX DM) 30-600 MG per 12 hr tablet 1 tablet (not administered)  0.9 %  sodium chloride infusion (not administered)  ceFEPIme (MAXIPIME) 1 g in dextrose 5 % 50 mL IVPB (not administered)  ondansetron (ZOFRAN) injection 4 mg (not administered)  acetaminophen (TYLENOL) tablet 650 mg (not administered)  zolpidem  (AMBIEN)  tablet 5 mg (not administered)  ipratropium-albuterol (DUONEB) 0.5-2.5 (3) MG/3ML nebulizer solution 3 mL (not administered)  ceFEPIme (MAXIPIME) 2 g in dextrose 5 % 50 mL IVPB (0 g Intravenous Stopped 12/28/15 2105)  vancomycin (VANCOCIN) 1,500 mg in sodium chloride 0.9 % 500 mL IVPB (1,500 mg Intravenous New Bag/Given 12/28/15 2108)     Initial Impression / Assessment and Plan / ED Course  I have reviewed the triage vital signs and the nursing notes.  Pertinent labs & imaging results that were available during my care of the patient were reviewed by me and considered in my medical decision making (see chart for details).  Clinical Course     Patient presents today with left-sided chest pain, increased worker breathing starting today. On my exam the patient did feel warm on tactile examination. Obtained a rectal temperature and her temperature was 102. Given her symptoms of shortness of breath with history of COPD and recent hospitalization, I'm concerned for healthcare acquired pneumonia. Blood cultures were obtained and she was given antibiotics for coverage of this.  At this point she does not have any increased oxygen requirement and is in fact on 1 L of oxygen rather than her home to earlier. She continues to have O2 saturations in the 90s. I discussed with the hospitalist who is agreeable to admit the patient for further management. Patient is stable the time of admission.  Final Clinical Impressions(s) / ED Diagnoses   Final diagnoses:  HCAP (healthcare-associated pneumonia)  Fever in adult  History of COPD    New Prescriptions Current Discharge Medication List       Theodosia Quay, MD 12/29/15 0003    Sharlett Iles, MD 01/04/16 [phone]

## 2015-12-28 NOTE — Progress Notes (Signed)
CRITICAL VALUE ALERT  Critical value received:  Troponin 0.07  Date of notification:  12/28/15  Time of notification:  23:35  Critical value read back:Yes.    Nurse who received alert:  [place] RN  MD notified (1st page):  Blaine Hamper  Time of first page:  23:36  MD notified (2nd page):  Time of second page:  Responding MD:  Blaine Hamper  Time MD responded:  0000

## 2015-12-28 NOTE — Progress Notes (Signed)
Pharmacy Antibiotic Note  [name] is a 82 y.o. female admitted on 12/28/2015 with pneumonia.  Pharmacy has been consulted for vancomycin dosing. Pt with Tmax 102 and WBC 14.7. Scr is 1.12 and lactic acid is normal.   Plan: Vancomycin 1500mg  IV x 1 then 1gm IV Q24H F/u renal fxn, C&S, clinical status and trough at SS  Height: 5' (152.4 cm) Weight: 157 lb (71.2 kg) IBW/kg (Calculated) : 45.5  Temp (24hrs), Avg:100.6 F (38.1 C), Min:99.1 F (37.3 C), Max:102 F (38.9 C)   Recent Labs Lab 12/28/15 1910 12/28/15 2036  WBC 14.7*  --   CREATININE 1.12*  --   LATICACIDVEN  --  0.98    Estimated Creatinine Clearance: 30.6 mL/min (by C-G formula based on SCr of 1.12 mg/dL (H)).    Allergies  Allergen Reactions  . Indomethacin Anaphylaxis and Other (See Comments)    "took it fine for awhile; one day I stopped breathing and I ended up in the hospital" (01/02/2012) Pt has tolerated aspirin    Antimicrobials this admission: Vanc 12/27>> Cefepime x 1 12/27  Dose adjustments this admission: N/A  Microbiology results: Pending  Thank you for allowing pharmacy to be a part of this patient's care.  [name], Rande Lawman 12/28/2015 8:45 PM

## 2015-12-28 NOTE — H&P (Addendum)
History and Physical    [name] [id number] DOB: 14-Feb-1925 DOA: 12/28/2015  Referring MD/NP/PA:   PCP: Leonard Downing, MD   Patient coming from:  The patient is coming from home.  At baseline, pt is independent for most of ADL.  Chief Complaint: Fever, cough, chest pain, shortness of breath  HPI: [name] is a 82 y.o. female with medical history significant of hypertension, hyperlipidemia, COPD, GERD, hypothyroidism, stroke,sCHF, and deficiency anemia, PAF on Eliquis, who presents with fever, cough, chest pain, shortness of breath.  Patient states that she started having fever, cough, chest pain and shortness of breath today, which has been progressively getting worse. He coughs up yellow colored sputum. She can speak in full sentences. Her chest pain is located in the left seventh chest, constant, 10 out of 10 in severity, radiating to the upper back, pleuritic, aggravated by deep breath. Patient does not have nausea, vomiting, diarrhea, abdominal pain, symptoms of UTI or unilateral weakness.  ED Course: pt was found to have WBC 14.7, lactate 0.98, INR 0.98, BNP 300, creatinine 1.12, temperature 102, tachypnea, no tachycardia, oxygen saturation 96% on room air. Chest x-ray showed COPD and bronchitic change. Patient is admitted to telemetry bed as inpatient.  Review of Systems:   General: no fevers, chills, no changes in body weight, has poor appetite, has fatigue HEENT: no blurry vision, hearing changes or sore throat Respiratory: has dyspnea, coughing, no wheezing CV: no chest pain, no palpitations GI: no nausea, vomiting, abdominal pain, diarrhea, constipation GU: no dysuria, burning on urination, increased urinary frequency, hematuria  Ext: no leg edema Neuro: no unilateral weakness, numbness, or tingling, no vision change or hearing loss Skin: no rash, no skin tear. MSK: No muscle spasm, no deformity, no limitation of range of movement in spin Heme: No easy  bruising.  Travel history: No recent long distant travel.  Allergy:  Allergies  Allergen Reactions  . Indomethacin Anaphylaxis and Other (See Comments)    "took it fine for awhile; one day I stopped breathing and I ended up in the hospital" (01/02/2012) Pt has tolerated aspirin    Past Medical History:  Diagnosis Date  . Anemia    years ago  . Arthritis    "right shoulder" (01/02/2012)  . Bladder mass   . CAP (community acquired pneumonia) 01/02/2012   Lower lobe   . CHF (congestive heart failure) ([place])   . COPD (chronic obstructive pulmonary disease) ([place])   . Difficulty sleeping   . Diverticulitis of colon with perforation 01/14/2012   That is post sigmoid resection with [facility] pouch and end colostomy   . GERD (gastroesophageal reflux disease)    occasional  . Goiter    radioactive iodine ablation/notes 01/02/2012  . History of kidney stones    X 1  . Hypertension   . Hypothyroidism    "I have taken Synthroid before" (01/02/2012)   HAS HAD RADIOACTIVE IODINE TX 2 YR S AGO  . Intra-abdominal abscess ([place]) 01/14/2012  . PAF (paroxysmal atrial fibrillation) ([place])   . Pneumatosis of intestines 01/14/2012  . Pneumonia 01/02/2012; ? 2009  . Radiation 08/10/13-09/15/13   57 gray to vaginal/bladder mass  . SOB (shortness of breath)    'all the time" (01/02/2012)  . Stroke ([place])   . UTI (lower urinary tract infection) 01/03/2012    Past Surgical History:  Procedure Laterality Date  . ABDOMINAL HYSTERECTOMY  1980's  . APPENDECTOMY  1980's   "when they did hysterectomy" (01/02/2012)  .  CATARACT EXTRACTION W/ INTRAOCULAR LENS  IMPLANT, BILATERAL  ?1990's   [place]il  . COLON SURGERY    . COLOSTOMY N/A 03/12/2012   Procedure: COLOSTOMY;  Surgeon: Ralene Ok, MD;  Location: [place];  Service: General;  Laterality: N/A;  . COLOSTOMY REVISION N/A 03/12/2012   Procedure: COLON RESECTION SIGMOID ;  Surgeon: Ralene Ok, MD;  Location: [place];  Service: General;  Laterality: N/A;  . COLOSTOMY  TAKEDOWN N/A 10/21/2012   Procedure: LAPAROSCOPIC COLOSTOMY TAKEDOWN AND HARTMANS ANASTOMOSIS, rigid proctoscopy;  Surgeon: Ralene Ok, MD;  Location: [facility];  Service: General;  Laterality: N/A;  . EYE SURGERY    . LYSIS OF ADHESION N/A 10/21/2012   Procedure: LYSIS OF ADHESION;  Surgeon: Ralene Ok, MD;  Location: [facility];  Service: General;  Laterality: N/A;  . TRANSURETHRAL RESECTION OF BLADDER TUMOR WITH GYRUS (TURBT-GYRUS) N/A 06/30/2013   Procedure: TRANSURETHRAL RESECTION OF BLADDER TUMOR WITH GYRUS (TURBT-GYRUS)/VAGINAL BIOPSY;  Surgeon: Ardis Hughs, MD;  Location: [facility];  Service: Urology;  Laterality: N/A;    Social History:  reports that she quit smoking about 25 years ago. Her smoking use included Cigarettes. She has a 4.20 pack-year smoking history. She has never used smokeless tobacco. She reports that she does not drink alcohol or use drugs.  Family History:  Family History  Problem Relation Age of Onset  . Cancer Sister     Breast  . Stroke Sister   . Heart disease Mother   . Heart disease       Prior to Admission medications   Medication Sig Start Date End Date Taking? Authorizing Provider  acetaminophen (TYLENOL) 500 MG tablet Take 1,000 mg by mouth every 6 (six) hours as needed for mild pain. Reported on 07/14/2015   Yes Historical Provider, MD  albuterol (PROVENTIL HFA;VENTOLIN HFA) 108 (90 BASE) MCG/ACT inhaler Inhale 2 puffs into the lungs every 6 (six) hours as needed for wheezing.   Yes Historical Provider, MD  albuterol (PROVENTIL) (2.5 MG/3ML) 0.083% nebulizer solution Take 3 mLs (2.5 mg total) by nebulization every 6 (six) hours as needed for wheezing or shortness of breath. 12/17/15  Yes Albertine Patricia, MD  apixaban (ELIQUIS) 5 MG TABS tablet Take 1 tablet (5 mg total) by mouth 2 (two) times daily. 08/28/15  Yes Shanker Kristeen Mans, MD  aspirin 325 MG tablet Take 325 mg by mouth every 4 (four) hours as needed for mild pain.   Yes Historical  Provider, MD  Budesonide-Formoterol Fumarate (SYMBICORT IN) Inhale 2 puffs into the lungs 2 (two) times daily.   Yes Historical Provider, MD  cholecalciferol (VITAMIN D) 1000 units tablet Take 1,000 Units by mouth daily.   Yes Historical Provider, MD  ferrous sulfate 325 (65 FE) MG tablet Take 325 mg by mouth daily as needed (iron supplement).   Yes Historical Provider, MD  furosemide (LASIX) 40 MG tablet Take 1 tablet (40 mg total) by mouth daily. 12/18/15  Yes Silver Huguenin Elgergawy, MD  lisinopril (PRINIVIL,ZESTRIL) 20 MG tablet Take 1 tablet (20 mg total) by mouth daily. 05/27/15  Yes Thayer Headings, MD  polyethylene glycol [facility] / Floria Raveling) packet Take 17 g by mouth daily.   Yes Historical Provider, MD  polyvinyl alcohol (LIQUIFILM TEARS) 1.4 % ophthalmic solution Place 1 drop into both eyes as needed for dry eyes.   Yes Historical Provider, MD  Prenatal Vit-Fe Fumarate-FA (MULTIVITAMIN-PRENATAL) 27-0.8 MG TABS tablet Take 1 tablet by mouth daily at 12 noon.   Yes Historical  Provider, MD  atorvastatin (LIPITOR) 40 MG tablet Take 1 tablet (40 mg total) by mouth daily at 6 PM. Patient not taking: Reported on 12/28/2015 05/27/15   Thayer Headings, MD  predniSONE (DELTASONE) 10 MG tablet Please take 40 mg oral daily for 2 days, then 30 mg oral daily for 2 days, then 20 mg oral daily for 2 days, then 10 mg oral daily for 2 days, then stop Patient not taking: Reported on 12/28/2015 12/17/15   Albertine Patricia, MD    Physical Exam: Vitals:   12/28/15 2316 12/28/15 2320 12/28/15 2344 12/29/15 0512  BP: (!) 159/69   (!) 132/51  Pulse: 87   69  Resp: (!) 27   20  Temp: 98.8 F (37.1 C)   99 F (37.2 C)  TempSrc: Oral   Oral  SpO2: 98% 98%  98%  Weight:   71.7 kg (158 lb)   Height:   5' (1.524 m)    General: Not in acute distress HEENT:       Eyes: PERRL, EOMI, no scleral icterus.       ENT: No discharge from the ears and nose, no pharynx injection, no tonsillar enlargement.        Neck:  No JVD, no bruit, no mass felt. Heme: No neck lymph node enlargement. Cardiac: S1/S2, RRR, No murmurs, No gallops or rubs. Respiratory: No rales, wheezing, rhonchi or rubs. GI: Soft, nondistended, nontender, no rebound pain, no organomegaly, BS present. GU: No hematuria Ext: No pitting leg edema bilaterally. 2+DP/PT pulse bilaterally. Musculoskeletal: No joint deformities, No joint redness or warmth, no limitation of ROM in spin. Skin: No rashes.  Neuro: Alert, oriented X3, cranial nerves II-XII grossly intact, moves all extremities normally.  Psych: Patient is not psychotic, no suicidal or hemocidal ideation.  Labs on Admission: I have personally reviewed following labs and imaging studies  CBC:  Recent Labs Lab 12/28/15 1910  WBC 14.7*  NEUTROABS 12.8*  HGB 9.8*  HCT 29.9*  MCV 91.4  PLT 123456   Basic Metabolic Panel:  Recent Labs Lab 12/28/15 1910  NA 137  K 3.9  CL 100*  CO2 26  GLUCOSE 124*  BUN 20  CREATININE 1.12*  CALCIUM 8.5*   GFR: Estimated Creatinine Clearance: 30.7 mL/min (by C-G formula based on SCr of 1.12 mg/dL (H)). Liver Function Tests: No results for input(s): AST, ALT, ALKPHOS, BILITOT, PROT, ALBUMIN in the last 168 hours. No results for input(s): LIPASE, AMYLASE in the last 168 hours. No results for input(s): AMMONIA in the last 168 hours. Coagulation Profile: No results for input(s): INR, PROTIME in the last 168 hours. Cardiac Enzymes:  Recent Labs Lab 12/28/15 2225 12/29/15 0345  TROPONINI 0.07* 0.07*   BNP (last 3 results) No results for input(s): PROBNP in the last 8760 hours. HbA1C: No results for input(s): HGBA1C in the last 72 hours. CBG: No results for input(s): GLUCAP in the last 168 hours. Lipid Profile: No results for input(s): CHOL, HDL, LDLCALC, TRIG, CHOLHDL, LDLDIRECT in the last 72 hours. Thyroid Function Tests: No results for input(s): TSH, T4TOTAL, FREET4, T3FREE, THYROIDAB in the last 72 hours. Anemia Panel: No  results for input(s): VITAMINB12, FOLATE, FERRITIN, TIBC, IRON, RETICCTPCT in the last 72 hours. Urine analysis:    Component Value Date/Time   COLORURINE YELLOW 12/28/2015 2220   APPEARANCEUR CLOUDY (A) 12/28/2015 2220   LABSPEC 1.025 12/28/2015 2220   LABSPEC 1.010 10/29/2013 1105   PHURINE 6.0 12/28/2015 2220   GLUCOSEU NEGATIVE  12/28/2015 2220   GLUCOSEU Negative 10/29/2013 1105   HGBUR NEGATIVE 12/28/2015 2220   BILIRUBINUR NEGATIVE 12/28/2015 2220   BILIRUBINUR Negative 10/29/2013 1105   KETONESUR NEGATIVE 12/28/2015 2220   PROTEINUR 100 (A) 12/28/2015 2220   UROBILINOGEN 1.0 02/13/2014 1137   UROBILINOGEN 0.2 10/29/2013 1105   NITRITE NEGATIVE 12/28/2015 2220   LEUKOCYTESUR LARGE (A) 12/28/2015 2220   LEUKOCYTESUR Trace 10/29/2013 1105   Sepsis Labs: @LABRCNTIP (procalcitonin:4,lacticidven:4) )No results found for this or any previous visit (from the past 240 hour(s)).   Radiological Exams on Admission: Dg Chest Portable 1 View  Result Date: 12/28/2015 CLINICAL DATA:  Central chest pain, shortness of breath, cough EXAM: PORTABLE CHEST 1 VIEW COMPARISON:  None. FINDINGS: There is hyperinflation of the lungs compatible with COPD. Heart is upper limits normal in size. Mild peribronchial thickening and interstitial prominence, likely chronic bronchitis. No confluent opacities or effusions. Previously seen interstitial edema has improved/resolved. IMPRESSION: COPD/chronic bronchitic changes. Electronically Signed   By: Rolm Baptise M.D.   On: 12/28/2015 19:27     EKG: Independently reviewed.  QTC 49, PVC, T-wave inversion in inferior leads and V4-V6. QRS widening which is old. Assessment/Plan Principal Problem:   Acute on chronic respiratory failure with hypoxia (HCC) Active Problems:   COPD (chronic obstructive pulmonary disease) (HCC)   Hypertension   Chronic systolic CHF (congestive heart failure) (HCC)   Sepsis (HCC)   Elevated troponin   Cerebrovascular accident  (CVA) due to embolism of left middle cerebral artery (HCC)   HLD (hyperlipidemia)   PAF (paroxysmal atrial fibrillation) (HCC)   Chest pain   Acute respiratory failure (HCC)   AKI (acute kidney injury) ([place])   Acute on chronic respiratory failure with hypoxia and sepsis: Chest x-ray showed bronchitic change without infiltration. Given patient's productive cough, CP and sepsis, will treat patient with antibiotics with broad coverage for possible early stage of pneumonia. Patient meets criteria for sepsis with leukocytosis, fever, tachypnea. Lactate is normal. Currently hemodynamically stable.  - Will admit to telemetry bed as inpt - IV Vancomycin and cefepime. - Mucinex for cough  - prn Albuterol Nebs, DuoNeb for SOB - Urine S. pneumococcal antigen - Follow up blood culture x2, sputum culture and respiratory virus panel, plus Flu pcr - will get Procalcitonin and trend lactic acid level per sepsis protocol - IVF: 75 mL per hour of NS (patient has sCHF, limiting aggressive IV fluids treatment)  COPD (chronic obstructive pulmonary disease) ([place]): Patient does not have wheezing or rhonchi. Does not seem to have COPD exacerbation. -Breathing treatment as above  HTN: -Hold lisinopril and Lasix due to AKI -IV hydralazine when necessary  Mild AKI: cer 1.12. Likely due to prerenal secondary to dehydration and continuation of ACEI and diruetics. - IVF as above - Check  FeUrea - Follow up renal function by BMP - Avoid ACEI and Diuretics  Chronic systolic CHF: 2-D echo on 12/15/15 showed EF 45-50 percent. Patient does not have leg edema DVT. BNP is mildly elevated at 300, clinically does not seem to have CHF exacerbation. -Hold her Lasix due to sepsis and AKI -continue ASA  Elevated troponin: Creatinine 0.07. Patient seems to have a chronically elevated troponin at 0.07-0.12. This is likely due to demand ischemia secondary to sepsis. -Troponin 3 -Check A1c and FLP -Continue aspirin, when  necessary nitroglycerin and morphine  Hx of stroke: -continue ASA  HLD: Last LDL was 48 on 08/24/15. Patient used to be on Lipitor, which was discontinued. -Check FLP  Atrial Fibrillation:  CHA2DS2-VASc Score is 7, needs oral anticoagulation. Patient is on Eliquis at home. INR is  on admission. Heart rate is well controlled. -continue Eliquis   DVT ppx: On Eliquiis Code Status: DNR Family Communication:  Yes, patient's daughter and is on   at bed side Disposition Plan:  Anticipate discharge back to previous home environment Consults called:  none Admission status:  Inpatient/tele   Date of Service 12/29/2015    Ivor Costa [facility] Pager [phone]  If 7PM-7AM, please contact night-coverage [email or web address] Password [facility] 12/29/2015, 5:32 AM

## 2015-12-29 ENCOUNTER — Encounter (HOSPITAL_COMMUNITY): Payer: Self-pay

## 2015-12-29 DIAGNOSIS — A419 Sepsis, unspecified organism: Secondary | ICD-10-CM | POA: Diagnosis not present

## 2015-12-29 DIAGNOSIS — N179 Acute kidney failure, unspecified: Secondary | ICD-10-CM

## 2015-12-29 DIAGNOSIS — I472 Ventricular tachycardia: Secondary | ICD-10-CM | POA: Diagnosis not present

## 2015-12-29 DIAGNOSIS — R748 Abnormal levels of other serum enzymes: Secondary | ICD-10-CM | POA: Diagnosis not present

## 2015-12-29 DIAGNOSIS — I480 Paroxysmal atrial fibrillation: Secondary | ICD-10-CM | POA: Diagnosis not present

## 2015-12-29 DIAGNOSIS — E785 Hyperlipidemia, unspecified: Secondary | ICD-10-CM

## 2015-12-29 DIAGNOSIS — J9621 Acute and chronic respiratory failure with hypoxia: Secondary | ICD-10-CM | POA: Diagnosis not present

## 2015-12-29 DIAGNOSIS — I10 Essential (primary) hypertension: Secondary | ICD-10-CM | POA: Diagnosis not present

## 2015-12-29 DIAGNOSIS — J208 Acute bronchitis due to other specified organisms: Secondary | ICD-10-CM | POA: Diagnosis not present

## 2015-12-29 LAB — LACTIC ACID, PLASMA: Lactic Acid, Venous: 1.7 mmol/L (ref 0.5–1.9)

## 2015-12-29 LAB — STREP PNEUMONIAE URINARY ANTIGEN: Strep Pneumo Urinary Antigen: NEGATIVE

## 2015-12-29 LAB — RESPIRATORY PANEL BY PCR
Adenovirus: NOT DETECTED
BORDETELLA PERTUSSIS-RVPCR: NOT DETECTED
CORONAVIRUS NL63-RVPPCR: NOT DETECTED
Chlamydophila pneumoniae: NOT DETECTED
Coronavirus 229E: NOT DETECTED
Coronavirus HKU1: NOT DETECTED
Coronavirus OC43: NOT DETECTED
INFLUENZA A-RVPPCR: NOT DETECTED
INFLUENZA B-RVPPCR: NOT DETECTED
MYCOPLASMA PNEUMONIAE-RVPPCR: NOT DETECTED
Metapneumovirus: NOT DETECTED
PARAINFLUENZA VIRUS 3-RVPPCR: NOT DETECTED
PARAINFLUENZA VIRUS 4-RVPPCR: NOT DETECTED
Parainfluenza Virus 1: NOT DETECTED
Parainfluenza Virus 2: NOT DETECTED
RESPIRATORY SYNCYTIAL VIRUS-RVPPCR: NOT DETECTED
RHINOVIRUS / ENTEROVIRUS - RVPPCR: NOT DETECTED

## 2015-12-29 LAB — PROCALCITONIN

## 2015-12-29 LAB — INFLUENZA PANEL BY PCR (TYPE A & B)
Influenza A By PCR: NEGATIVE
Influenza B By PCR: NEGATIVE

## 2015-12-29 LAB — TROPONIN I
TROPONIN I: 0.07 ng/mL — AB (ref <0.03)
TROPONIN I: 0.06 ng/mL — AB (ref <0.03)

## 2015-12-29 LAB — MAGNESIUM: Magnesium: 2 mg/dL (ref 1.7–2.4)

## 2015-12-29 MED ORDER — BISOPROLOL FUMARATE 5 MG PO TABS
5.0000 mg | ORAL_TABLET | Freq: Every day | ORAL | Status: DC
  Administered 2015-12-29 – 2016-01-01 (×4): 5 mg via ORAL
  Filled 2015-12-29 (×4): qty 1

## 2015-12-29 MED ORDER — NITROGLYCERIN 0.4 MG SL SUBL: 0.4000 mg | SUBLINGUAL_TABLET | SUBLINGUAL | Status: DC | PRN

## 2015-12-29 MED ORDER — POTASSIUM CHLORIDE CRYS ER 20 MEQ PO TBCR
20.0000 meq | EXTENDED_RELEASE_TABLET | Freq: Once | ORAL | Status: AC
Start: 2015-12-29 — End: 2015-12-29
  Administered 2015-12-29: 20 meq via ORAL
  Filled 2015-12-29: qty 1

## 2015-12-29 MED ORDER — MORPHINE SULFATE (PF) 2 MG/ML IV SOLN: 2.0000 mg | INTRAVENOUS | Status: DC | PRN

## 2015-12-29 MED ORDER — ORAL CARE MOUTH RINSE
15.0000 mL | Freq: Two times a day (BID) | OROMUCOSAL | Status: DC
  Administered 2015-12-29 – 2016-01-01 (×5): 15 mL via OROMUCOSAL

## 2015-12-29 NOTE — Progress Notes (Signed)
PROGRESS NOTE  [name]  ZHY:[id number] DOB: July 20, 1925  DOA: 12/28/2015 PCP: Leonard Downing, MD   Brief Narrative:  82 y.o. female with medical history significant of hypertension, hyperlipidemia, COPD, GERD, hypothyroidism, stroke,sCHF, and deficiency anemia, PAF on Eliquis, who presents with fever, productive cough,  pleuritic chest pain, shortness of breath of 1 day duration. Although chest x-ray showed COPD and bronchitic changes, high index of suspicion for possible early stage pneumonia and admitted for further management.   Assessment & Plan:   Principal Problem:   Acute on chronic respiratory failure with hypoxia (HCC) Active Problems:   COPD (chronic obstructive pulmonary disease) (HCC)   Hypertension   Chronic systolic CHF (congestive heart failure) (HCC)   Sepsis (HCC)   Elevated troponin   Cerebrovascular accident (CVA) due to embolism of left middle cerebral artery (HCC)   HLD (hyperlipidemia)   PAF (paroxysmal atrial fibrillation) ([place])   Chest pain   Acute respiratory failure ([place])   AKI (acute kidney injury) ([place])   1. Acute on chronic hypoxic respiratory failure on home oxygen 2 L/m: Secondary to acute bronchitis and possible early pneumonia. Initial chest x-ray showed bronchitic changes without infiltration. However given her productive cough, pleuritic chest pain and sepsis physiology, treating for possible pneumonia. Improving. Continue oxygen supplements and wean as tolerated, bronchodilators. 2. Sepsis secondary to acute viral bronchitis versus possible early pneumonia/HCAP: Patient met sepsis criteria on admission. Treating empirically with IV vancomycin and cefepime. Blood cultures 2: Negative to date. RSV panel negative. Influenza panel PCR AMB negative. Pro-calcitonin <0.10. Repeat chest x-ray 12/29. Lactate normal. Urine streptococcal antigen negative. 3. NSVT: Noted 26 beat NSVT, asymptomatic on telemetry on 12/28 at approximately 4 AM. Continue  telemetry monitoring. Likely precipitated by acute illness. Cardiology consulted. Check magnesium. 4. COPD: No clinical exacerbation. 5. Essential hypertension: Holding lisinopril and Lasix secondary to acute kidney injury. When necessary IV hydralazine. Controlled. 6. Mild acute kidney injury: Holding ACEI and diuretics. Follow BMP in a.m. 7. Chronic combined systolic & diastolic CHF: 2-D echo 29/52/84 showed LVEF 45-50 percent. Clinically euvolemic. Holding Lasix for today. Reassess and resume in a.m. DC IV fluids. 8. Elevated troponin: Flat trend. No ischemic type of chest pain. Chronic elevation. May be due to demand ischemia. Continue aspirin, when necessary NTG and morphine. 9. History of stroke: Continue aspirin. 10. HLD: Not on statins-unclear reasons. 11. Paroxysmal A. Fib: CHA2DS2-VASc Score is 7. Remains in sinus rhythm. Continue Eliquis. 12. Anemia: Etiology follow CBCs.   DVT prophylaxis: Eliquis Code Status: DNR Family Communication: None at bedside Disposition Plan: DC home when medically stable.   Consultants:   None  Procedures:   None  Antimicrobials:   Cefepime and vancomycin    Subjective: Feels better compared to admission. Dyspnea improved. Still has productive cough. No chest pain reported.  Objective:  Vitals:   12/29/15 0512 12/29/15 0757 12/29/15 1244 12/29/15 1402  BP: (!) 132/51  134/65   Pulse: 69  67   Resp: 20  20   Temp: 99 F (37.2 C)  98.3 F (36.8 C)   TempSrc: Oral  Oral   SpO2: 98% 98% 98% 97%  Weight:      Height:        Intake/Output Summary (Last 24 hours) at 12/29/15 1555 Last data filed at 12/29/15 1248  Gross per 24 hour  Intake              650 ml  Output  0 ml  Net              650 ml   Filed Weights   12/28/15 1813 12/28/15 2344  Weight: 71.2 kg (157 lb) 71.7 kg (158 lb)    Examination:  General exam: Pleasant elderly female sitting up in chair eating breakfast this morning. Respiratory system:  Harsh breath sounds posteriorly and? Bronchial breath sounds in the right base. Rest of lung fields clear to auscultation. Respiratory effort normal. Cardiovascular system: S1 & S2 heard, RRR. No JVD, murmurs, rubs, gallops or clicks. No pedal edema. Telemetry: Sinus rhythm. 26 beat NSVT at 4 AM on 12/28. Gastrointestinal system: Abdomen is nondistended, soft and nontender. No organomegaly or masses felt. Normal bowel sounds heard. Central nervous system: Alert and oriented. No focal neurological deficits. Extremities: Symmetric 5 x 5 power. Skin: No rashes, lesions or ulcers Psychiatry: Judgement and insight appear normal. Mood & affect appropriate.     Data Reviewed: I have personally reviewed following labs and imaging studies  CBC:  Recent Labs Lab 12/28/15 1910  WBC 14.7*  NEUTROABS 12.8*  HGB 9.8*  HCT 29.9*  MCV 91.4  PLT 440   Basic Metabolic Panel:  Recent Labs Lab 12/28/15 1910  NA 137  K 3.9  CL 100*  CO2 26  GLUCOSE 124*  BUN 20  CREATININE 1.12*  CALCIUM 8.5*   GFR: Estimated Creatinine Clearance: 30.7 mL/min (by C-G formula based on SCr of 1.12 mg/dL (H)). Liver Function Tests: No results for input(s): AST, ALT, ALKPHOS, BILITOT, PROT, ALBUMIN in the last 168 hours. No results for input(s): LIPASE, AMYLASE in the last 168 hours. No results for input(s): AMMONIA in the last 168 hours. Coagulation Profile: No results for input(s): INR, PROTIME in the last 168 hours. Cardiac Enzymes:  Recent Labs Lab 12/28/15 2225 12/29/15 0345 12/29/15 0927  TROPONINI 0.07* 0.07* 0.06*   BNP (last 3 results) No results for input(s): PROBNP in the last 8760 hours. HbA1C: No results for input(s): HGBA1C in the last 72 hours. CBG: No results for input(s): GLUCAP in the last 168 hours. Lipid Profile: No results for input(s): CHOL, HDL, LDLCALC, TRIG, CHOLHDL, LDLDIRECT in the last 72 hours. Thyroid Function Tests: No results for input(s): TSH, T4TOTAL, FREET4,  T3FREE, THYROIDAB in the last 72 hours. Anemia Panel: No results for input(s): VITAMINB12, FOLATE, FERRITIN, TIBC, IRON, RETICCTPCT in the last 72 hours.  Sepsis Labs:  Recent Labs Lab 12/28/15 2036 12/28/15 2225 12/29/15 0121  PROCALCITON  --  <0.10  --   LATICACIDVEN 0.98 1.0 1.7    Recent Results (from the past 240 hour(s))  Blood culture (routine x 2)     Status: None (Preliminary result)   Collection Time: 12/28/15  7:09 PM  Result Value Ref Range Status   Specimen Description BLOOD LEFT ANTECUBITAL  Final   Special Requests BOTTLES DRAWN AEROBIC AND ANAEROBIC 5CC  Final   Culture NO GROWTH < 24 HOURS  Final   Report Status PENDING  Incomplete  Blood culture (routine x 2)     Status: None (Preliminary result)   Collection Time: 12/28/15  8:25 PM  Result Value Ref Range Status   Specimen Description BLOOD RIGHT ANTECUBITAL  Final   Special Requests BOTTLES DRAWN AEROBIC AND ANAEROBIC 5CC  Final   Culture NO GROWTH < 24 HOURS  Final   Report Status PENDING  Incomplete  Respiratory Panel by PCR     Status: None   Collection Time: 12/28/15 11:51 PM  Result Value Ref Range Status   Adenovirus NOT DETECTED NOT DETECTED Final   Coronavirus 229E NOT DETECTED NOT DETECTED Final   Coronavirus HKU1 NOT DETECTED NOT DETECTED Final   Coronavirus NL63 NOT DETECTED NOT DETECTED Final   Coronavirus OC43 NOT DETECTED NOT DETECTED Final   Metapneumovirus NOT DETECTED NOT DETECTED Final   Rhinovirus / Enterovirus NOT DETECTED NOT DETECTED Final   Influenza A NOT DETECTED NOT DETECTED Final   Influenza B NOT DETECTED NOT DETECTED Final   Parainfluenza Virus 1 NOT DETECTED NOT DETECTED Final   Parainfluenza Virus 2 NOT DETECTED NOT DETECTED Final   Parainfluenza Virus 3 NOT DETECTED NOT DETECTED Final   Parainfluenza Virus 4 NOT DETECTED NOT DETECTED Final   Respiratory Syncytial Virus NOT DETECTED NOT DETECTED Final   Bordetella pertussis NOT DETECTED NOT DETECTED Final    Chlamydophila pneumoniae NOT DETECTED NOT DETECTED Final   Mycoplasma pneumoniae NOT DETECTED NOT DETECTED Final         Radiology Studies: Dg Chest Portable 1 View  Result Date: 12/28/2015 CLINICAL DATA:  Central chest pain, shortness of breath, cough EXAM: PORTABLE CHEST 1 VIEW COMPARISON:  None. FINDINGS: There is hyperinflation of the lungs compatible with COPD. Heart is upper limits normal in size. Mild peribronchial thickening and interstitial prominence, likely chronic bronchitis. No confluent opacities or effusions. Previously seen interstitial edema has improved/resolved. IMPRESSION: COPD/chronic bronchitic changes. Electronically Signed   By: Rolm Baptise M.D.   On: 12/28/2015 19:27        Scheduled Meds: . apixaban  5 mg Oral BID  . ceFEPime (MAXIPIME) IV  1 g Intravenous Q24H  . cholecalciferol  1,000 Units Oral Daily  . dextromethorphan-guaiFENesin  1 tablet Oral BID  . ipratropium-albuterol  3 mL Nebulization TID  . mouth rinse  15 mL Mouth Rinse BID  . polyethylene glycol  17 g Oral Daily  . prenatal vitamin w/FE, FA  1 tablet Oral Q1200  . vancomycin  1,000 mg Intravenous Q24H   Continuous Infusions: . sodium chloride 75 mL/hr at 12/29/15 0010     LOS: 1 day       [facility], MD [facility] Pager [phone]  If 7PM-7AM, please contact night-coverage [email or web address] Password [facility] 12/29/2015, 3:55 PM

## 2015-12-29 NOTE — Consult Note (Addendum)
Cardiology Consult    Patient ID: [name] MRN: [id number], DOB/AGE: [birth year]   Admit date: 12/28/2015 Date of Consult: 12/29/2015  Primary Physician: Leonard Downing, MD Reason for Consult: NSVT Primary Cardiologist: Dr. Acie Fredrickson Requesting Provider: Dr. Algis Liming   History of Present Illness    [name] is a 82 y.o. female with past medical history of chronic combined systolic and diastolic CHF (EF [id number] by echo in 08/2015), prior CVA (04/2015), hypothyroidism, PAF (on Eliquis), COPD, and HTN who presented to [facility] ED on 12/28/2015 for evaluation of fever, productive cough, and sharp chest pain.   She was recently admitted from 12/14 - 12/17/2015 for acute on chronic hypoxic respiratory failure secondary to COPD and CHF exacerbation.  Echo that admission showed an EF of 45-50%, which is similar to prior tracings. She was discharged on PO Lasix 40mg  daily.   Starting a few days ago, she began to experience fevers and a productive cough. Developed chest pain as well which she says was sharp in etiology and made worse with deep inspiration and coughing.   While in the ED, her initial labs showed a WBC of 14.7, Hgb 9.8, and platelets 211. K+ 3.9 and creatinine 1.12. BNP 300. Cyclic troponin values have been flat at 0.07, 0.07, and 0.06. CXR showed COPD with chronic bronchitic changes. EKG shows NSR, HR 72, with frequent PVC's, and known LBBB.   She has been started on broad spectrum antibiotics for sepsis and likely early PNA. Was given IVF at 57mL/hr which has since been discontinued. Weight has remained stable at 158 lbs.   Cardiology is consulted today in relation to NSVT. She was noted to have 26 beats of NSVT on telemetry on 12/28 and was asymptomatic with this. Telemetry has shown NSR with frequent PVC's but not further runs of VT.  She does report still having some chest discomfort, but says this is greatly improved since admission. Her pain is made worse with  coughing and her chest wall is tender to palpation.    Past Medical History   Past Medical History:  Diagnosis Date  . Anemia    years ago  . Arthritis    "right shoulder" (01/02/2012)  . Bladder mass   . CAP (community acquired pneumonia) 01/02/2012   Lower lobe   . CHF (congestive heart failure) ([place])   . COPD (chronic obstructive pulmonary disease) ([place])   . Difficulty sleeping   . Diverticulitis of colon with perforation 01/14/2012   That is post sigmoid resection with [facility] pouch and end colostomy   . GERD (gastroesophageal reflux disease)    occasional  . Goiter    radioactive iodine ablation/notes 01/02/2012  . History of kidney stones    X 1  . Hypertension   . Hypothyroidism    "I have taken Synthroid before" (01/02/2012)   HAS HAD RADIOACTIVE IODINE TX 2 YR S AGO  . Intra-abdominal abscess ([place]) 01/14/2012  . PAF (paroxysmal atrial fibrillation) ([place])   . Pneumatosis of intestines 01/14/2012  . Pneumonia 01/02/2012; ? 2009  . Radiation 08/10/13-09/15/13   57 gray to vaginal/bladder mass  . SOB (shortness of breath)    'all the time" (01/02/2012)  . Stroke ([place])   . UTI (lower urinary tract infection) 01/03/2012    Past Surgical History:  Procedure Laterality Date  . ABDOMINAL HYSTERECTOMY  1980's  . APPENDECTOMY  1980's   "when they did hysterectomy" (01/02/2012)  . CATARACT EXTRACTION W/ INTRAOCULAR LENS  IMPLANT, BILATERAL  ?  1990's   Bil  . COLON SURGERY    . COLOSTOMY N/A 03/12/2012   Procedure: COLOSTOMY;  Surgeon: Ralene Ok, MD;  Location: [place];  Service: General;  Laterality: N/A;  . COLOSTOMY REVISION N/A 03/12/2012   Procedure: COLON RESECTION SIGMOID ;  Surgeon: Ralene Ok, MD;  Location: [place];  Service: General;  Laterality: N/A;  . COLOSTOMY TAKEDOWN N/A 10/21/2012   Procedure: LAPAROSCOPIC COLOSTOMY TAKEDOWN AND HARTMANS ANASTOMOSIS, rigid proctoscopy;  Surgeon: Ralene Ok, MD;  Location: [facility];  Service: General;  Laterality: N/A;  . EYE  SURGERY    . LYSIS OF ADHESION N/A 10/21/2012   Procedure: LYSIS OF ADHESION;  Surgeon: Ralene Ok, MD;  Location: [facility];  Service: General;  Laterality: N/A;  . TRANSURETHRAL RESECTION OF BLADDER TUMOR WITH GYRUS (TURBT-GYRUS) N/A 06/30/2013   Procedure: TRANSURETHRAL RESECTION OF BLADDER TUMOR WITH GYRUS (TURBT-GYRUS)/VAGINAL BIOPSY;  Surgeon: Ardis Hughs, MD;  Location: [facility];  Service: Urology;  Laterality: N/A;     Allergies  Allergies  Allergen Reactions  . Indomethacin Anaphylaxis and Other (See Comments)    "took it fine for awhile; one day I stopped breathing and I ended up in the hospital" (01/02/2012) Pt has tolerated aspirin    Inpatient Medications    . apixaban  5 mg Oral BID  . ceFEPime (MAXIPIME) IV  1 g Intravenous Q24H  . cholecalciferol  1,000 Units Oral Daily  . dextromethorphan-guaiFENesin  1 tablet Oral BID  . ipratropium-albuterol  3 mL Nebulization TID  . mouth rinse  15 mL Mouth Rinse BID  . polyethylene glycol  17 g Oral Daily  . potassium chloride  20 mEq Oral Once  . prenatal vitamin w/FE, FA  1 tablet Oral Q1200  . vancomycin  1,000 mg Intravenous Q24H    Family History    Family History  Problem Relation Age of Onset  . Cancer Sister     Breast  . Stroke Sister   . Heart disease Mother   . Heart disease      Social History    Social History   Social History  . Marital status: Widowed    Spouse name: N/A  . Number of children: 6  . Years of education: N/A   Occupational History  . retired    Social History Main Topics  . Smoking status: Former Smoker    Packs/day: 0.12    Years: 35.00    Types: Cigarettes    Quit date: 01/02/1991  . Smokeless tobacco: Never Used  . Alcohol use No  . Drug use: No  . Sexual activity: No   Other Topics Concern  . Not on file   Social History Narrative  . No narrative on file     Review of Systems    General:  No chills, fever, night sweats or weight changes.  Cardiovascular:   No dyspnea on exertion, edema, orthopnea, palpitations, paroxysmal nocturnal dyspnea. Positive for chest pain and dyspnea.  Dermatological: No rash, lesions/masses Respiratory: Positive for productive cough and dyspnea. Urologic: No hematuria, dysuria Abdominal:   No nausea, vomiting, diarrhea, bright red blood per rectum, melena, or hematemesis Neurologic:  No visual changes, wkns, changes in mental status. All other systems reviewed and are otherwise negative except as noted above.  Physical Exam    Blood pressure 134/65, pulse 67, temperature 98.3 F (36.8 C), temperature source Oral, resp. rate 20, height 5' (1.524 m), weight 158 lb (71.7 kg), SpO2 97 %.  General: Pleasant,  elderly African American female appearing in NAD Psych: Normal affect. Neuro: Alert and oriented X 3. Moves all extremities spontaneously. HEENT: Normal  Neck: Supple without bruits or JVD. Lungs:  Resp regular and unlabored, mild expiratory wheezing in upper lung fields. No rales appreciated. Heart: RRR no s3, s4, or murmurs. Abdomen: Soft, non-tender, non-distended, BS + x 4.  Extremities: No clubbing, cyanosis or edema. DP/PT/Radials 2+ and equal bilaterally.  Labs    Troponin (Point of Care Test) No results for input(s): TROPIPOC in the last 72 hours.  Recent Labs  12/28/15 2225 12/29/15 0345 12/29/15 0927  TROPONINI 0.07* 0.07* 0.06*   Lab Results  Component Value Date   WBC 14.7 (H) 12/28/2015   HGB 9.8 (L) 12/28/2015   HCT 29.9 (L) 12/28/2015   MCV 91.4 12/28/2015   PLT 211 12/28/2015    Recent Labs Lab 12/28/15 1910  NA 137  K 3.9  CL 100*  CO2 26  BUN 20  CREATININE 1.12*  CALCIUM 8.5*  GLUCOSE 124*   Lab Results  Component Value Date   CHOL 96 08/24/2015   HDL 38 (L) 08/24/2015   LDLCALC 48 08/24/2015   TRIG 50 08/24/2015   No results found for: [facility]   Radiology Studies    Dg Chest 2 View  Result Date: 12/15/2015 CLINICAL DATA:  Increasing shortness of breath and  congestion. EXAM: CHEST  2 VIEW COMPARISON:  11/08/2015 and prior exams FINDINGS: Cardiomegaly noted. New interstitial opacities likely represent edema. Trace bilateral pleural effusions and minimal bibasilar atelectasis noted. There is no evidence of pneumothorax. No acute bony abnormalities are identified. IMPRESSION: Cardiomegaly with interstitial pulmonary edema and trace bilateral pleural effusions. Electronically Signed   By: Margarette Canada M.D.   On: 12/15/2015 11:10   Dg Chest Portable 1 View  Result Date: 12/28/2015 CLINICAL DATA:  Central chest pain, shortness of breath, cough EXAM: PORTABLE CHEST 1 VIEW COMPARISON:  None. FINDINGS: There is hyperinflation of the lungs compatible with COPD. Heart is upper limits normal in size. Mild peribronchial thickening and interstitial prominence, likely chronic bronchitis. No confluent opacities or effusions. Previously seen interstitial edema has improved/resolved. IMPRESSION: COPD/chronic bronchitic changes. Electronically Signed   By: Rolm Baptise M.D.   On: 12/28/2015 19:27    EKG & Cardiac Imaging    EKG: NSR, HR 72, with frequent PVC's, and known LBBB.  Echocardiogram: 12/15/2015 Study Conclusions  - Left ventricle: The cavity size was normal. Wall thickness was   increased in a pattern of mild LVH. Systolic function was mildly   reduced. The estimated ejection fraction was in the range of 45%   to 50%. - Mitral valve: Calcified annulus. Mildly thickened leaflets . - Left atrium: The atrium was moderately dilated.  Impressions:  - When compared to report of Aug 2017 there is minimal change in   LVEF  Assessment & Plan    1. NSVT - noted to have 26 beats of NSVT on telemetry on 12/28 and was asymptomatic with this. No further runs noted. Does have frequent PVC's.   - Agree with rechecking K+ and checking Mg.  - Initiate low dose cardioselective BB such as Toprol-XL or Bisoprolol but would monitor respiratory status closely with  her known COPD. If she is found to have recurrent runs, would need to consider ischemia workup and possibly antiarrhythmic therapy.  2. Atypical Chest Pain - reports having a sharp chest discomfort which is worse with coughing and her chest wall is tender to palpation. Overall, ?  pleuritic in etiology. States coughing started after the episode of CP - troponin values flat at 0.07, 0.07, and 0.06 and EKG shows known LBBB with no acute changes. - would not pursue further ischemic evaluation in the setting of her atypical symptoms.   3. Chronic Combined Systolic and Diastolic CHF  - EF [id number] by recent echocardiogram. BNP mildly elevated to 300 on admission in the setting of PNA.  - PTA Lasix 40mg  daily held in the setting of Sepsis. Was receiving IVF but this have been discontinued. She does not appear volume overloaded on physical exam but would recommend resuming Lasix within the next 1-2 days to avoid acute CHF!  4. PAF - This patients CHA2DS2-VASc Score and unadjusted Ischemic Stroke Rate (% per year) is equal to 11.2 % stroke rate/year from a score of 7 (CHF, HTN, Female, Age (2), Stroke (2)). Continue Eliquis for anticoagulation.  - maintaining NSR on telemetry.   5. Sepsis in the setting of Presumed HCAP - remains on Cefepime and Vancomycin - per admitting team   Signed, Erma Heritage, PA-C 12/29/2015, 4:31 PM Pager: [phone]  The patient has been seen in conjunction with Bernerd Pho, [place]. All aspects of care have been considered and discussed. The patient has been personally interviewed, examined, and all clinical data has been reviewed.   Reviewed the 27 beat episode of WCT. Suspect non-sustained VT. Etiology unclear and could be beta agonist related, ischemia related, or due to LV dysfunction (EF 45%).  Agree with starting low dose beta 1 selective blocker such as bisoprolol.  Will monitor and follow with you.

## 2015-12-30 ENCOUNTER — Inpatient Hospital Stay (HOSPITAL_COMMUNITY): Payer: Medicare Other

## 2015-12-30 DIAGNOSIS — J9621 Acute and chronic respiratory failure with hypoxia: Secondary | ICD-10-CM

## 2015-12-30 DIAGNOSIS — I480 Paroxysmal atrial fibrillation: Secondary | ICD-10-CM

## 2015-12-30 DIAGNOSIS — R748 Abnormal levels of other serum enzymes: Secondary | ICD-10-CM | POA: Diagnosis not present

## 2015-12-30 DIAGNOSIS — I472 Ventricular tachycardia: Secondary | ICD-10-CM | POA: Diagnosis not present

## 2015-12-30 DIAGNOSIS — N179 Acute kidney failure, unspecified: Secondary | ICD-10-CM | POA: Diagnosis not present

## 2015-12-30 DIAGNOSIS — A419 Sepsis, unspecified organism: Secondary | ICD-10-CM | POA: Diagnosis not present

## 2015-12-30 LAB — HEMOGLOBIN A1C
Hgb A1c MFr Bld: 5.3 % (ref 4.8–5.6)
Mean Plasma Glucose: 105 mg/dL

## 2015-12-30 LAB — LIPID PANEL
CHOL/HDL RATIO: 2.8 ratio
CHOLESTEROL: 147 mg/dL (ref 0–200)
HDL: 53 mg/dL (ref >40)
LDL CALC: 80 mg/dL (ref 0–99)
TRIGLYCERIDES: 69 mg/dL (ref <150)
VLDL: 14 mg/dL (ref 0–40)

## 2015-12-30 LAB — CBC
HCT: 25.7 % — ABNORMAL LOW (ref 36.0–46.0)
Hemoglobin: 9.1 g/dL — ABNORMAL LOW (ref 12.0–15.0)
MCH: 34.2 pg — AB (ref 26.0–34.0)
MCHC: 35.4 g/dL (ref 30.0–36.0)
MCV: 96.6 fL (ref 78.0–100.0)
PLATELETS: 209 10*3/uL (ref 150–400)
RBC: 2.66 MIL/uL — AB (ref 3.87–5.11)
RDW: 13.4 % (ref 11.5–15.5)
WBC: 9.3 10*3/uL (ref 4.0–10.5)

## 2015-12-30 LAB — BASIC METABOLIC PANEL
Anion gap: 6 (ref 5–15)
BUN: 12 mg/dL (ref 6–20)
CO2: 27 mmol/L (ref 22–32)
CREATININE: 0.85 mg/dL (ref 0.44–1.00)
Calcium: 8.2 mg/dL — ABNORMAL LOW (ref 8.9–10.3)
Chloride: 102 mmol/L (ref 101–111)
GFR, EST NON AFRICAN AMERICAN: 59 mL/min — AB (ref >60)
Glucose, Bld: 130 mg/dL — ABNORMAL HIGH (ref 65–99)
Potassium: 4.5 mmol/L (ref 3.5–5.1)
SODIUM: 135 mmol/L (ref 135–145)

## 2015-12-30 LAB — UREA NITROGEN, URINE: UREA NITROGEN UR: 1189 mg/dL

## 2015-12-30 LAB — LEGIONELLA PNEUMOPHILA SEROGP 1 UR AG: L. pneumophila Serogp 1 Ur Ag: NEGATIVE

## 2015-12-30 MED ORDER — LEVOFLOXACIN 500 MG PO TABS
500.0000 mg | ORAL_TABLET | Freq: Every day | ORAL | Status: DC
  Administered 2015-12-30 – 2016-01-01 (×3): 500 mg via ORAL
  Filled 2015-12-30 (×3): qty 1

## 2015-12-30 MED ORDER — FUROSEMIDE 40 MG PO TABS
40.0000 mg | ORAL_TABLET | Freq: Every day | ORAL | Status: DC
  Administered 2015-12-30 – 2016-01-01 (×3): 40 mg via ORAL
  Filled 2015-12-30 (×3): qty 1

## 2015-12-30 NOTE — Progress Notes (Signed)
Patient Name: [name] Date of Encounter: 12/30/2015  Primary Cardiologist: [facility] Problem List     Principal Problem:   Acute on chronic respiratory failure with hypoxia (HCC) Active Problems:   COPD (chronic obstructive pulmonary disease) (HCC)   Hypertension   Chronic systolic CHF (congestive heart failure) (HCC)   Sepsis (HCC)   Elevated troponin   Cerebrovascular accident (CVA) due to embolism of left middle cerebral artery (HCC)   HLD (hyperlipidemia)   PAF (paroxysmal atrial fibrillation) (HCC)   Chest pain   Acute respiratory failure ([place])   AKI (acute kidney injury) ([place])     Subjective   Feels better this morning. No palpitations. Dyspnea is improved.  Inpatient Medications    Scheduled Meds: . apixaban  5 mg Oral BID  . bisoprolol  5 mg Oral Daily  . ceFEPime (MAXIPIME) IV  1 g Intravenous Q24H  . cholecalciferol  1,000 Units Oral Daily  . dextromethorphan-guaiFENesin  1 tablet Oral BID  . ipratropium-albuterol  3 mL Nebulization TID  . mouth rinse  15 mL Mouth Rinse BID  . polyethylene glycol  17 g Oral Daily  . prenatal vitamin w/FE, FA  1 tablet Oral Q1200  . vancomycin  1,000 mg Intravenous Q24H   Continuous Infusions:  PRN Meds: acetaminophen, albuterol, ferrous sulfate, hydrALAZINE, morphine injection, nitroGLYCERIN, ondansetron, polyvinyl alcohol, zolpidem   Vital Signs    Vitals:   12/29/15 1952 12/29/15 2102 12/30/15 0434 12/30/15 0854  BP:  (!) 120/49 (!) 145/51   Pulse:  70 66   Resp:  (!) 24 (!) 24   Temp:  98.8 F (37.1 C) 98.6 F (37 C)   TempSrc:  Oral Oral   SpO2: 98% 100% 97% 96%  Weight:   160 lb 9.6 oz (72.8 kg)   Height:        Intake/Output Summary (Last 24 hours) at 12/30/15 1019 Last data filed at 12/30/15 0832  Gross per 24 hour  Intake              850 ml  Output              450 ml  Net              400 ml   Filed Weights   12/28/15 1813 12/28/15 2344 12/30/15 0434  Weight: 157 lb (71.2 kg)  158 lb (71.7 kg) 160 lb 9.6 oz (72.8 kg)    Physical Exam   Elderly female in no acute distress. GEN: Well nourished, well developed, in no acute distress.  HEENT: Grossly normal.  Neck: Supple, no JVD, carotid bruits, or masses. Cardiac: RRR, no murmurs, rubs, or gallops. No clubbing, cyanosis, edema.  Radials/DP/PT 2+ and equal bilaterally.  Respiratory:  Respirations regular and unlabored, clear to auscultation bilaterally. GI: Soft, nontender, nondistended, BS + x 4. MS: no deformity or atrophy. Skin: warm and dry, no rash. Neuro:  Strength and sensation are intact. Psych: AAOx3.  Normal affect.  Labs    CBC  Recent Labs  12/28/15 1910 12/30/15 0442  WBC 14.7* 9.3  NEUTROABS 12.8*  --   HGB 9.8* 9.1*  HCT 29.9* 25.7*  MCV 91.4 96.6  PLT 211 [id number]   Basic Metabolic Panel  Recent Labs  12/28/15 1910 12/29/15 1630 12/30/15 0442  NA 137  --  135  K 3.9  --  4.5  CL 100*  --  102  CO2 26  --  27  GLUCOSE 124*  --  130*  BUN 20  --  12  CREATININE 1.12*  --  0.85  CALCIUM 8.5*  --  8.2*  MG  --  2.0  --    Liver Function Tests No results for input(s): AST, ALT, ALKPHOS, BILITOT, PROT, ALBUMIN in the last 72 hours. No results for input(s): LIPASE, AMYLASE in the last 72 hours. Cardiac Enzymes  Recent Labs  12/28/15 2225 12/29/15 0345 12/29/15 0927  TROPONINI 0.07* 0.07* 0.06*   BNP Invalid input(s): POCBNP D-Dimer No results for input(s): DDIMER in the last 72 hours. Hemoglobin A1C No results for input(s): HGBA1C in the last 72 hours. Fasting Lipid Panel  Recent Labs  12/30/15 0442  CHOL 147  HDL 53  LDLCALC 80  TRIG 69  CHOLHDL 2.8   Thyroid Function Tests No results for input(s): TSH, T4TOTAL, T3FREE, THYROIDAB in the last 72 hours.  Invalid input(s): FREET3  Telemetry    Normal sinus rhythm with frequent to rare PVCs. No nonsustained ventricular tachycardia noted. - Personally Reviewed  ECG    No new tracing compared to yesterday's  note. - Personally Reviewed  Radiology    Dg Chest 2 View  Result Date: 12/30/2015 CLINICAL DATA:  Wheezing/hypoxiaPt states that she came into the hospital with SOB and chest pain. Her chest pain is gone, but she still has SOBHx of HTN- on meds, ex-smoker, COPD, CHF, PNA EXAM: CHEST  2 VIEW COMPARISON:  12/28/2015 FINDINGS: The cardiac silhouette is mildly enlarged. No mediastinal or hilar masses. No evidence of adenopathy. There are prominent bronchovascular markings bilaterally, stable. No evidence of pneumonia. Minimal pleural effusions blunt the posterior costophrenic sulci. No pneumothorax. The skeletal structures are demineralized. There is a mild compression deformity of a mid thoracic vertebra, old. IMPRESSION: 1. No acute cardiopulmonary disease. 2. COPD/chronic lung changes and mild cardiomegaly. Electronically Signed   By: Lajean Manes M.D.   On: 12/30/2015 08:08   Dg Chest Portable 1 View  Result Date: 12/28/2015 CLINICAL DATA:  Central chest pain, shortness of breath, cough EXAM: PORTABLE CHEST 1 VIEW COMPARISON:  None. FINDINGS: There is hyperinflation of the lungs compatible with COPD. Heart is upper limits normal in size. Mild peribronchial thickening and interstitial prominence, likely chronic bronchitis. No confluent opacities or effusions. Previously seen interstitial edema has improved/resolved. IMPRESSION: COPD/chronic bronchitic changes. Electronically Signed   By: Rolm Baptise M.D.   On: 12/28/2015 19:27    Cardiac Studies   No new data.  Patient Profile     81 year old female patient of Dr. Acie Fredrickson, admitted with cough, fever, and shortness of breath. Known history of paroxysmal atrial fibrillation, systolic heart failure, COPD, and hypertension. Noted to have 27 beat run of nonsustained ventricular tachycardia on telemetry, asymptomatic.  Assessment & Plan    1. Nonsustained ventricular tachycardia has not recurred. Low-dose beta blocker has been started without  aggravating pulmonary status. No further workup planned unless recurrent arrhythmia.  2. History of paroxysmal atrial fibrillation. No atrial fib during this hospital stay.  Please call if we may be of further assistance.  Signed, Sinclair Grooms, MD  12/30/2015, 10:19 AM

## 2015-12-30 NOTE — Progress Notes (Signed)
PROGRESS NOTE  [name]  DGU:[id number] DOB: [birth year]  DOA: 12/28/2015 PCP: Leonard Downing, MD   Brief Narrative:  82 y.o. female with medical history significant of hypertension, hyperlipidemia, COPD, GERD, hypothyroidism, stroke,sCHF, and deficiency anemia, PAF on Eliquis, who presents with fever, productive cough,  pleuritic chest pain, shortness of breath of 1 day duration. Although chest x-ray showed COPD and bronchitic changes, high index of suspicion for possible early stage pneumonia and admitted for further management.   Assessment & Plan:   Principal Problem:   Acute on chronic respiratory failure with hypoxia (HCC) Active Problems:   COPD (chronic obstructive pulmonary disease) (HCC)   Hypertension   Chronic systolic CHF (congestive heart failure) (HCC)   Sepsis (HCC)   Elevated troponin   Cerebrovascular accident (CVA) due to embolism of left middle cerebral artery (HCC)   HLD (hyperlipidemia)   PAF (paroxysmal atrial fibrillation) ([place])   Chest pain   Acute respiratory failure ([place])   AKI (acute kidney injury) ([place])   1. Acute on chronic hypoxic respiratory failure on home oxygen 2 L/m: Secondary to acute bronchitis. Initial chest x-ray showed bronchitic changes without infiltration. However given her productive cough, pleuritic chest pain and sepsis physiology, treated for possible pneumonia. Improving. Continue oxygen supplements and wean as tolerated, bronchodilators. 2. Sepsis secondary to acute viral bronchitis: Patient met sepsis criteria on admission. Treating empirically with IV vancomycin and cefepime. Blood cultures 2: Negative to date. RSV panel negative. Influenza panel PCR A&B negative. Pro-calcitonin <0.10. Repeat chest x-ray 12/29 without pneumonia. Lactate normal. Urine streptococcal antigen negative. Discontinue vancomycin and cefepime and transition to oral levofloxacin. 3. NSVT: Noted 26 beat NSVT, asymptomatic on telemetry on 12/28 at  approximately 4 AM. Continue telemetry monitoring. Likely precipitated by acute illness. Potassium and magnesium replaced. Cardiology consultation and follow-up appreciated. Low-dose bisoprolol added without any further episodes of NSVT. 4. COPD: No clinical exacerbation. 5. Essential hypertension: Holding lisinopril and Lasix secondary to acute kidney injury. When necessary IV hydralazine. Controlled. 6. Mild acute kidney injury: Resolved. 7. Chronic combined systolic & diastolic CHF: 2-D echo 95/63/87 showed LVEF 45-50 percent. Clinically euvolemic. Holding Lasix for today. Reassess and resume in a.m. DC IV fluids. 8. Elevated troponin: Flat trend. No ischemic type of chest pain. Chronic elevation. May be due to demand ischemia. Continue aspirin, when necessary NTG and morphine. No further workup per cardiology. 9. History of stroke: Continue aspirin. 10. HLD: Not on statins-unclear reasons. 11. Paroxysmal A. Fib: CHA2DS2-VASc Score is 7. Remains in sinus rhythm. Continue Eliquis. 12. Anemia: Stable. Follow CBCs periodically.   DVT prophylaxis: Eliquis Code Status: DNR Family Communication: None at bedside Disposition Plan: DC home when medically stable, possibly 12/30.Marland Kitchen   Consultants:   Cardiology  Procedures:   None  Antimicrobials:   Cefepime and vancomycin -discontinued 12/29  Oral levofloxacin 12/29 >   Subjective: Continues to feel better. States that her breathing may be close to baseline. No chest pain or palpitations reported.  Objective:  Vitals:   12/29/15 2102 12/30/15 0434 12/30/15 0854 12/30/15 1411  BP: (!) 120/49 (!) 145/51  (!) 142/62  Pulse: 70 66  60  Resp: (!) 24 (!) 24  18  Temp: 98.8 F (37.1 C) 98.6 F (37 C)  97.8 F (36.6 C)  TempSrc: Oral Oral  Oral  SpO2: 100% 97% 96% 99%  Weight:  72.8 kg (160 lb 9.6 oz)    Height:        Intake/Output Summary (Last 24 hours) at 12/30/15  [place] filed at 12/30/15 1300  Gross per 24 hour    Intake              610 ml  Output              450 ml  Net              160 ml   Filed Weights   12/28/15 1813 12/28/15 2344 12/30/15 0434  Weight: 71.2 kg (157 lb) 71.7 kg (158 lb) 72.8 kg (160 lb 9.6 oz)    Examination:  General exam: Pleasant elderly female sitting up in in bed without distress Respiratory system: clear to auscultation. Respiratory effort normal. Cardiovascular system: S1 & S2 heard, RRR. No JVD, murmurs, rubs, gallops or clicks. No pedal edema. Telemetry: Sinus rhythm. No further episodes of NSVT. Gastrointestinal system: Abdomen is nondistended, soft and nontender. No organomegaly or masses felt. Normal bowel sounds heard. Central nervous system: Alert and oriented. No focal neurological deficits. Extremities: Symmetric 5 x 5 power. Skin: No rashes, lesions or ulcers Psychiatry: Judgement and insight appear normal. Mood & affect appropriate.     Data Reviewed: I have personally reviewed following labs and imaging studies  CBC:  Recent Labs Lab 12/28/15 1910 12/30/15 0442  WBC 14.7* 9.3  NEUTROABS 12.8*  --   HGB 9.8* 9.1*  HCT 29.9* 25.7*  MCV 91.4 96.6  PLT 211 093   Basic Metabolic Panel:  Recent Labs Lab 12/28/15 1910 12/29/15 1630 12/30/15 0442  NA 137  --  135  K 3.9  --  4.5  CL 100*  --  102  CO2 26  --  27  GLUCOSE 124*  --  130*  BUN 20  --  12  CREATININE 1.12*  --  0.85  CALCIUM 8.5*  --  8.2*  MG  --  2.0  --    GFR: Estimated Creatinine Clearance: 40.7 mL/min (by C-G formula based on SCr of 0.85 mg/dL). Liver Function Tests: No results for input(s): AST, ALT, ALKPHOS, BILITOT, PROT, ALBUMIN in the last 168 hours. No results for input(s): LIPASE, AMYLASE in the last 168 hours. No results for input(s): AMMONIA in the last 168 hours. Coagulation Profile: No results for input(s): INR, PROTIME in the last 168 hours. Cardiac Enzymes:  Recent Labs Lab 12/28/15 2225 12/29/15 0345 12/29/15 0927  TROPONINI 0.07* 0.07*  0.06*   BNP (last 3 results) No results for input(s): PROBNP in the last 8760 hours. HbA1C: No results for input(s): HGBA1C in the last 72 hours. CBG: No results for input(s): GLUCAP in the last 168 hours. Lipid Profile:  Recent Labs  12/30/15 0442  CHOL 147  HDL 53  LDLCALC 80  TRIG 69  CHOLHDL 2.8   Thyroid Function Tests: No results for input(s): TSH, T4TOTAL, FREET4, T3FREE, THYROIDAB in the last 72 hours. Anemia Panel: No results for input(s): VITAMINB12, FOLATE, FERRITIN, TIBC, IRON, RETICCTPCT in the last 72 hours.  Sepsis Labs:  Recent Labs Lab 12/28/15 2036 12/28/15 2225 12/29/15 0121  PROCALCITON  --  <0.10  --   LATICACIDVEN 0.98 1.0 1.7    Recent Results (from the past 240 hour(s))  Blood culture (routine x 2)     Status: None (Preliminary result)   Collection Time: 12/28/15  7:09 PM  Result Value Ref Range Status   Specimen Description BLOOD LEFT ANTECUBITAL  Final   Special Requests BOTTLES DRAWN AEROBIC AND ANAEROBIC 5CC  Final   Culture NO GROWTH 2 DAYS  Final   Report Status PENDING  Incomplete  Blood culture (routine x 2)     Status: None (Preliminary result)   Collection Time: 12/28/15  8:25 PM  Result Value Ref Range Status   Specimen Description BLOOD RIGHT ANTECUBITAL  Final   Special Requests BOTTLES DRAWN AEROBIC AND ANAEROBIC 5CC  Final   Culture NO GROWTH 2 DAYS  Final   Report Status PENDING  Incomplete  Respiratory Panel by PCR     Status: None   Collection Time: 12/28/15 11:51 PM  Result Value Ref Range Status   Adenovirus NOT DETECTED NOT DETECTED Final   Coronavirus 229E NOT DETECTED NOT DETECTED Final   Coronavirus HKU1 NOT DETECTED NOT DETECTED Final   Coronavirus NL63 NOT DETECTED NOT DETECTED Final   Coronavirus OC43 NOT DETECTED NOT DETECTED Final   Metapneumovirus NOT DETECTED NOT DETECTED Final   Rhinovirus / Enterovirus NOT DETECTED NOT DETECTED Final   Influenza A NOT DETECTED NOT DETECTED Final   Influenza B NOT  DETECTED NOT DETECTED Final   Parainfluenza Virus 1 NOT DETECTED NOT DETECTED Final   Parainfluenza Virus 2 NOT DETECTED NOT DETECTED Final   Parainfluenza Virus 3 NOT DETECTED NOT DETECTED Final   Parainfluenza Virus 4 NOT DETECTED NOT DETECTED Final   Respiratory Syncytial Virus NOT DETECTED NOT DETECTED Final   Bordetella pertussis NOT DETECTED NOT DETECTED Final   Chlamydophila pneumoniae NOT DETECTED NOT DETECTED Final   Mycoplasma pneumoniae NOT DETECTED NOT DETECTED Final         Radiology Studies: Dg Chest 2 View  Result Date: 12/30/2015 CLINICAL DATA:  Wheezing/hypoxiaPt states that she came into the hospital with SOB and chest pain. Her chest pain is gone, but she still has SOBHx of HTN- on meds, ex-smoker, COPD, CHF, PNA EXAM: CHEST  2 VIEW COMPARISON:  12/28/2015 FINDINGS: The cardiac silhouette is mildly enlarged. No mediastinal or hilar masses. No evidence of adenopathy. There are prominent bronchovascular markings bilaterally, stable. No evidence of pneumonia. Minimal pleural effusions blunt the posterior costophrenic sulci. No pneumothorax. The skeletal structures are demineralized. There is a mild compression deformity of a mid thoracic vertebra, old. IMPRESSION: 1. No acute cardiopulmonary disease. 2. COPD/chronic lung changes and mild cardiomegaly. Electronically Signed   By: Amie Portland M.D.   On: 12/30/2015 08:08   Dg Chest Portable 1 View  Result Date: 12/28/2015 CLINICAL DATA:  Central chest pain, shortness of breath, cough EXAM: PORTABLE CHEST 1 VIEW COMPARISON:  None. FINDINGS: There is hyperinflation of the lungs compatible with COPD. Heart is upper limits normal in size. Mild peribronchial thickening and interstitial prominence, likely chronic bronchitis. No confluent opacities or effusions. Previously seen interstitial edema has improved/resolved. IMPRESSION: COPD/chronic bronchitic changes. Electronically Signed   By: Charlett Nose M.D.   On: 12/28/2015 19:27         Scheduled Meds: . apixaban  5 mg Oral BID  . bisoprolol  5 mg Oral Daily  . ceFEPime (MAXIPIME) IV  1 g Intravenous Q24H  . cholecalciferol  1,000 Units Oral Daily  . dextromethorphan-guaiFENesin  1 tablet Oral BID  . ipratropium-albuterol  3 mL Nebulization TID  . mouth rinse  15 mL Mouth Rinse BID  . polyethylene glycol  17 g Oral Daily  . prenatal vitamin w/FE, FA  1 tablet Oral Q1200  . vancomycin  1,000 mg Intravenous Q24H   Continuous Infusions:    LOS: 2 days       [facility], MD [facility] Pager [phone]  If 7PM-7AM, please contact night-coverage [email or web address] Password [email or web address] 12/30/2015, 2:55 PM

## 2015-12-31 DIAGNOSIS — J208 Acute bronchitis due to other specified organisms: Secondary | ICD-10-CM | POA: Diagnosis not present

## 2015-12-31 DIAGNOSIS — A419 Sepsis, unspecified organism: Secondary | ICD-10-CM | POA: Diagnosis not present

## 2015-12-31 DIAGNOSIS — J9621 Acute and chronic respiratory failure with hypoxia: Secondary | ICD-10-CM | POA: Diagnosis not present

## 2015-12-31 DIAGNOSIS — I472 Ventricular tachycardia: Secondary | ICD-10-CM | POA: Diagnosis not present

## 2015-12-31 LAB — CBC
HEMATOCRIT: 24.9 % — AB (ref 36.0–46.0)
HEMOGLOBIN: 8.9 g/dL — AB (ref 12.0–15.0)
MCH: 34.5 pg — AB (ref 26.0–34.0)
MCHC: 35.7 g/dL (ref 30.0–36.0)
MCV: 96.5 fL (ref 78.0–100.0)
Platelets: 224 10*3/uL (ref 150–400)
RBC: 2.58 MIL/uL — ABNORMAL LOW (ref 3.87–5.11)
RDW: 13.2 % (ref 11.5–15.5)
WBC: 7.7 10*3/uL (ref 4.0–10.5)

## 2015-12-31 MED ORDER — BENZONATATE 100 MG PO CAPS
200.0000 mg | ORAL_CAPSULE | Freq: Three times a day (TID) | ORAL | Status: DC
  Administered 2015-12-31 – 2016-01-01 (×4): 200 mg via ORAL
  Filled 2015-12-31 (×4): qty 2

## 2015-12-31 MED ORDER — GUAIFENESIN-DM 100-10 MG/5ML PO SYRP
5.0000 mL | ORAL_SOLUTION | ORAL | Status: DC | PRN
  Administered 2015-12-31: 5 mL via ORAL
  Filled 2015-12-31 (×2): qty 5

## 2015-12-31 NOTE — Progress Notes (Signed)
PROGRESS NOTE  [name]  XTG:[id number] DOB: Apr 22, 1925  DOA: 12/28/2015 PCP: Leonard Downing, MD   Brief Narrative:  82 y.o. female with medical history significant of hypertension, hyperlipidemia, COPD, GERD, hypothyroidism, stroke,sCHF, and deficiency anemia, PAF on Eliquis, who presents with fever, productive cough,  pleuritic chest pain, shortness of breath of 1 day duration. Although chest x-ray showed COPD and bronchitic changes, high index of suspicion for possible early stage pneumonia and admitted for further management. On further workup, no pneumonia. Likely acute viral bronchitis. Slowly improving.   Assessment & Plan:   Principal Problem:   Acute on chronic respiratory failure with hypoxia (HCC) Active Problems:   COPD (chronic obstructive pulmonary disease) (HCC)   Hypertension   Chronic systolic CHF (congestive heart failure) (HCC)   Sepsis (HCC)   Elevated troponin   Cerebrovascular accident (CVA) due to embolism of left middle cerebral artery (HCC)   HLD (hyperlipidemia)   PAF (paroxysmal atrial fibrillation) ([place])   Chest pain   Acute respiratory failure ([place])   AKI (acute kidney injury) ([place])   1. Acute on chronic hypoxic respiratory failure on home oxygen 2 L/m: Secondary to acute bronchitis. Initial chest x-ray showed bronchitic changes without infiltration. However given her productive cough, pleuritic chest pain and sepsis physiology, treated for possible pneumonia. Improving. Continue oxygen supplements and wean as tolerated, bronchodilators. 2. Sepsis secondary to acute viral bronchitis: Patient met sepsis criteria on admission. Treated empirically with IV vancomycin and cefepime. Blood cultures 2: Negative to date. RSV panel negative. Influenza panel PCR A&B negative. Pro-calcitonin <0.10. Repeat chest x-ray 12/29 without pneumonia. Lactate normal. Urine streptococcal antigen negative. Discontinued vancomycin and cefepime and transitioned to oral  levofloxacin. Patient distressed overnight with dry hacking cough with associated chest and abdominal pain from coughing. Started scheduled Tessalon. 3. NSVT: Noted 26 beat NSVT, asymptomatic on telemetry on 12/28 at approximately 4 AM. Continue telemetry monitoring. Likely precipitated by acute illness. Potassium and magnesium replaced. Cardiology consultation and follow-up appreciated. Low-dose bisoprolol added and improved. 4. COPD: No clinical exacerbation. 5. Essential hypertension: Holding lisinopril secondary to acute kidney injury. When necessary IV hydralazine. Controlled. 6. Mild acute kidney injury: Resolved. 7. Chronic combined systolic & diastolic CHF: 2-D echo 27/03/50 showed LVEF 45-50 percent. Clinically euvolemic. Resumed Lasix. 8. Elevated troponin: Flat trend. No ischemic type of chest pain. Chronic elevation. May be due to demand ischemia. Continue aspirin, when necessary NTG and morphine. No further workup per cardiology. 9. History of stroke: Continue aspirin. 10. HLD: Not on statins-unclear reasons. 11. Paroxysmal A. Fib: CHA2DS2-VASc Score is 7. Remains in sinus rhythm. Continue Eliquis. 12. Anemia: Stable. Follow CBCs periodically.   DVT prophylaxis: Eliquis Code Status: DNR Family Communication: Discussed with daughter at bedside. Disposition Plan: DC home when medically stable, possibly 12/31   Consultants:   Cardiology  Procedures:   None  Antimicrobials:   Cefepime and vancomycin -discontinued 12/29  Oral levofloxacin 12/29 >   Subjective: Persistent hacking cough overnight. Not much dyspnea. Chest and upper abdominal pain from coughing repeatedly.  Objective:  Vitals:   12/30/15 2136 12/31/15 0338 12/31/15 0412 12/31/15 0900  BP: (!) 138/57  (!) 129/50   Pulse: 63  60   Resp: 18  18   Temp: 99.1 F (37.3 C)  98.4 F (36.9 C)   TempSrc: Oral  Oral   SpO2: 99%  97% 97%  Weight:  72.1 kg (159 lb)    Height:        Intake/Output Summary  (Last  24 hours) at 12/31/15 1302 Last data filed at 12/31/15 1130  Gross per 24 hour  Intake              360 ml  Output             1600 ml  Net            -1240 ml   Filed Weights   12/28/15 2344 12/30/15 0434 12/31/15 0338  Weight: 71.7 kg (158 lb) 72.8 kg (160 lb 9.6 oz) 72.1 kg (159 lb)    Examination:  General exam: Pleasant elderly female sitting up in in bed without distress Respiratory system: clear to auscultation except occasional basal crackles. Respiratory effort normal. Cardiovascular system: S1 & S2 heard, RRR. No JVD, murmurs, rubs, gallops or clicks. No pedal edema. Telemetry: SB in the 50s-SR in the 60s. 4 beat NSVT. Gastrointestinal system: Abdomen is nondistended, soft and nontender. No organomegaly or masses felt. Normal bowel sounds heard. Central nervous system: Alert and oriented. No focal neurological deficits. Extremities: Symmetric 5 x 5 power. Skin: No rashes, lesions or ulcers Psychiatry: Judgement and insight appear normal. Mood & affect appropriate.     Data Reviewed: I have personally reviewed following labs and imaging studies  CBC:  Recent Labs Lab 12/28/15 1910 12/30/15 0442 12/31/15 0310  WBC 14.7* 9.3 7.7  NEUTROABS 12.8*  --   --   HGB 9.8* 9.1* 8.9*  HCT 29.9* 25.7* 24.9*  MCV 91.4 96.6 96.5  PLT 211 209 062   Basic Metabolic Panel:  Recent Labs Lab 12/28/15 1910 12/29/15 1630 12/30/15 0442  NA 137  --  135  K 3.9  --  4.5  CL 100*  --  102  CO2 26  --  27  GLUCOSE 124*  --  130*  BUN 20  --  12  CREATININE 1.12*  --  0.85  CALCIUM 8.5*  --  8.2*  MG  --  2.0  --    GFR: Estimated Creatinine Clearance: 40.5 mL/min (by C-G formula based on SCr of 0.85 mg/dL). Liver Function Tests: No results for input(s): AST, ALT, ALKPHOS, BILITOT, PROT, ALBUMIN in the last 168 hours. No results for input(s): LIPASE, AMYLASE in the last 168 hours. No results for input(s): AMMONIA in the last 168 hours. Coagulation Profile: No  results for input(s): INR, PROTIME in the last 168 hours. Cardiac Enzymes:  Recent Labs Lab 12/28/15 2225 12/29/15 0345 12/29/15 0927  TROPONINI 0.07* 0.07* 0.06*   BNP (last 3 results) No results for input(s): PROBNP in the last 8760 hours. HbA1C:  Recent Labs  12/30/15 0442  HGBA1C 5.3   CBG: No results for input(s): GLUCAP in the last 168 hours. Lipid Profile:  Recent Labs  12/30/15 0442  CHOL 147  HDL 53  LDLCALC 80  TRIG 69  CHOLHDL 2.8   Thyroid Function Tests: No results for input(s): TSH, T4TOTAL, FREET4, T3FREE, THYROIDAB in the last 72 hours. Anemia Panel: No results for input(s): VITAMINB12, FOLATE, FERRITIN, TIBC, IRON, RETICCTPCT in the last 72 hours.  Sepsis Labs:  Recent Labs Lab 12/28/15 2036 12/28/15 2225 12/29/15 0121  PROCALCITON  --  <0.10  --   LATICACIDVEN 0.98 1.0 1.7    Recent Results (from the past 240 hour(s))  Blood culture (routine x 2)     Status: None (Preliminary result)   Collection Time: 12/28/15  7:09 PM  Result Value Ref Range Status   Specimen Description BLOOD LEFT ANTECUBITAL  Final  Special Requests BOTTLES DRAWN AEROBIC AND ANAEROBIC 5CC  Final   Culture NO GROWTH 3 DAYS  Final   Report Status PENDING  Incomplete  Blood culture (routine x 2)     Status: None (Preliminary result)   Collection Time: 12/28/15  8:25 PM  Result Value Ref Range Status   Specimen Description BLOOD RIGHT ANTECUBITAL  Final   Special Requests BOTTLES DRAWN AEROBIC AND ANAEROBIC 5CC  Final   Culture NO GROWTH 3 DAYS  Final   Report Status PENDING  Incomplete  Respiratory Panel by PCR     Status: None   Collection Time: 12/28/15 11:51 PM  Result Value Ref Range Status   Adenovirus NOT DETECTED NOT DETECTED Final   Coronavirus 229E NOT DETECTED NOT DETECTED Final   Coronavirus HKU1 NOT DETECTED NOT DETECTED Final   Coronavirus NL63 NOT DETECTED NOT DETECTED Final   Coronavirus OC43 NOT DETECTED NOT DETECTED Final   Metapneumovirus NOT  DETECTED NOT DETECTED Final   Rhinovirus / Enterovirus NOT DETECTED NOT DETECTED Final   Influenza A NOT DETECTED NOT DETECTED Final   Influenza B NOT DETECTED NOT DETECTED Final   Parainfluenza Virus 1 NOT DETECTED NOT DETECTED Final   Parainfluenza Virus 2 NOT DETECTED NOT DETECTED Final   Parainfluenza Virus 3 NOT DETECTED NOT DETECTED Final   Parainfluenza Virus 4 NOT DETECTED NOT DETECTED Final   Respiratory Syncytial Virus NOT DETECTED NOT DETECTED Final   Bordetella pertussis NOT DETECTED NOT DETECTED Final   Chlamydophila pneumoniae NOT DETECTED NOT DETECTED Final   Mycoplasma pneumoniae NOT DETECTED NOT DETECTED Final         Radiology Studies: Dg Chest 2 View  Result Date: 12/30/2015 CLINICAL DATA:  Wheezing/hypoxiaPt states that she came into the hospital with SOB and chest pain. Her chest pain is gone, but she still has SOBHx of HTN- on meds, ex-smoker, COPD, CHF, PNA EXAM: CHEST  2 VIEW COMPARISON:  12/28/2015 FINDINGS: The cardiac silhouette is mildly enlarged. No mediastinal or hilar masses. No evidence of adenopathy. There are prominent bronchovascular markings bilaterally, stable. No evidence of pneumonia. Minimal pleural effusions blunt the posterior costophrenic sulci. No pneumothorax. The skeletal structures are demineralized. There is a mild compression deformity of a mid thoracic vertebra, old. IMPRESSION: 1. No acute cardiopulmonary disease. 2. COPD/chronic lung changes and mild cardiomegaly. Electronically Signed   By: Lajean Manes M.D.   On: 12/30/2015 08:08        Scheduled Meds: . apixaban  5 mg Oral BID  . benzonatate  200 mg Oral TID  . bisoprolol  5 mg Oral Daily  . cholecalciferol  1,000 Units Oral Daily  . dextromethorphan-guaiFENesin  1 tablet Oral BID  . furosemide  40 mg Oral Daily  . ipratropium-albuterol  3 mL Nebulization TID  . levofloxacin  500 mg Oral Daily  . mouth rinse  15 mL Mouth Rinse BID  . polyethylene glycol  17 g Oral Daily    . prenatal vitamin w/FE, FA  1 tablet Oral Q1200   Continuous Infusions:    LOS: 3 days       [facility], MD [facility] Pager [phone]  If 7PM-7AM, please contact night-coverage [email or web address] Password [email or web address] 12/31/2015, 1:02 PM

## 2015-12-31 NOTE — Evaluation (Signed)
Physical Therapy Evaluation Patient Details Name: [name] MRN: [id number] DOB: [birth year] Today's Date: 12/31/2015   History of Present Illness  82 y.o. female with medical history significant of hypertension, hyperlipidemia, COPD, GERD, hypothyroidism, stroke,sCHF, and deficiency anemia, PAF on Eliquis, who presents with fever, productive cough,  pleuritic chest pain, shortness of breath of 1 day duration. Although chest x-ray showed COPD and bronchitic changes, high index of suspicion for possible early stage pneumonia and admitted for further management.   Clinical Impression  Pt admitted with above diagnosis. Pt currently with functional limitations due to the deficits listed below (see PT Problem List). Pt with SOB with amb but SpO2 >90% on RA. Pt declined HHPT due to "they've come out before and I know what I need to do." Pt encouraged her to amb 3-5x/day and begin an exercise program.    Follow Up Recommendations No PT follow up (pt declined HHPT)    Equipment Recommendations  None recommended by PT    Recommendations for Other Services       Precautions / Restrictions Precautions Precautions: Fall Precaution Comments: SOB Restrictions Weight Bearing Restrictions: No      Mobility  Bed Mobility               General bed mobility comments: pt sitting OOB in recliner when PT entered room  Transfers Overall transfer level: Needs assistance Equipment used: Rolling walker (2 wheeled) Transfers: Sit to/from Stand Sit to Stand: Min guard         General transfer comment: v/c's to push up from chair not pull up on rollator  Ambulation/Gait Ambulation/Gait assistance: Min guard Ambulation Distance (Feet): 100 Feet Assistive device: Rolling walker (2 wheeled) Gait Pattern/deviations: Step-through pattern;Decreased stride length;Trunk flexed Gait velocity: decreased Gait velocity interpretation: <1.8 ft/sec, indicative of risk for recurrent falls General Gait  Details: v/c's to stay in walker and not push walker to far in front of self. Spo2 >90% on 2LO2 via [place] despite SOB  Stairs            Wheelchair Mobility    Modified Rankin (Stroke Patients Only)       Balance Overall balance assessment: Needs assistance Sitting-balance support: Feet supported;No upper extremity supported Sitting balance-Leahy Scale: Good     Standing balance support: During functional activity;No upper extremity supported Standing balance-Leahy Scale: Fair                               Pertinent Vitals/Pain Pain Assessment: No/denies pain    Home Living Family/patient expects to be discharged to:: Private residence Living Arrangements: Children Available Help at Discharge: Family;Available 24 hours/day Type of Home: House Home Access: Stairs to enter Entrance Stairs-Rails: Psychiatric nurse of Steps: 1 Home Layout: One level Home Equipment: Walker - 2 wheels;Shower seat Additional Comments: on 2LO2 via [place] at home    Prior Function Level of Independence: Needs assistance   Gait / Transfers Assistance Needed: Uses RW for mobility.  ADL's / Homemaking Assistance Needed: dtr assists with bathing and dressing, son makes meals        Hand Dominance   Dominant Hand: Right    Extremity/Trunk Assessment   Upper Extremity Assessment Upper Extremity Assessment: Generalized weakness    Lower Extremity Assessment Lower Extremity Assessment: Generalized weakness    Cervical / Trunk Assessment Cervical / Trunk Assessment: Kyphotic  Communication   Communication: No difficulties  Cognition Arousal/Alertness: Awake/alert Behavior During Therapy:  WFL for tasks assessed/performed Overall Cognitive Status: Within Functional Limits for tasks assessed                      General Comments      Exercises     Assessment/Plan    PT Assessment Patient needs continued PT services  PT Problem List Decreased  activity tolerance;Decreased balance;Decreased mobility;Decreased coordination;Cardiopulmonary status limiting activity          PT Treatment Interventions DME instruction;Gait training;Stair training;Functional mobility training;Therapeutic activities;Therapeutic exercise;Balance training;Neuromuscular re-education;Patient/family education    PT Goals (Current goals can be found in the Care Plan section)  Acute Rehab PT Goals Patient Stated Goal: return home PT Goal Formulation: With patient Time For Goal Achievement: 01/07/16 Potential to Achieve Goals: Good    Frequency Min 2X/week   Barriers to discharge        Co-evaluation               End of Session Equipment Utilized During Treatment: Gait belt;Oxygen Activity Tolerance: Patient tolerated treatment well Patient left: in chair;with call bell/phone within reach;with family/visitor present Nurse Communication: Mobility status         Time: [id number] PT Time Calculation (min) (ACUTE ONLY): 26 min   Charges:   PT Evaluation $PT Eval Moderate Complexity: 1 Procedure PT Treatments $Gait Training: 8-22 mins   PT G Codes:        Malvin Morrish M Jennette Leask 12/31/2015, 2:36 PM   Kittie Plater, PT, DPT Pager #: [phone] Office #: [phone]

## 2016-01-01 DIAGNOSIS — J9621 Acute and chronic respiratory failure with hypoxia: Secondary | ICD-10-CM | POA: Diagnosis not present

## 2016-01-01 DIAGNOSIS — I10 Essential (primary) hypertension: Secondary | ICD-10-CM | POA: Diagnosis not present

## 2016-01-01 DIAGNOSIS — J208 Acute bronchitis due to other specified organisms: Secondary | ICD-10-CM

## 2016-01-01 DIAGNOSIS — E785 Hyperlipidemia, unspecified: Secondary | ICD-10-CM | POA: Diagnosis not present

## 2016-01-01 DIAGNOSIS — N179 Acute kidney failure, unspecified: Secondary | ICD-10-CM | POA: Diagnosis not present

## 2016-01-01 MED ORDER — LEVOFLOXACIN 500 MG PO TABS: 500.0000 mg | ORAL_TABLET | Freq: Every day | ORAL | 0 refills | Status: DC

## 2016-01-01 MED ORDER — BISOPROLOL FUMARATE 5 MG PO TABS: 5.0000 mg | ORAL_TABLET | Freq: Every day | ORAL | 0 refills | Status: DC

## 2016-01-01 MED ORDER — BENZONATATE 200 MG PO CAPS: 200.0000 mg | ORAL_CAPSULE | Freq: Three times a day (TID) | ORAL | 0 refills | Status: DC

## 2016-01-01 MED ORDER — BISOPROLOL FUMARATE 5 MG PO TABS
5.0000 mg | ORAL_TABLET | Freq: Every day | ORAL | 0 refills | Status: DC
Start: 2016-01-02 — End: 2016-05-18

## 2016-01-01 NOTE — Discharge Instructions (Addendum)
Acute Bronchitis, Adult Acute bronchitis is sudden (acute) swelling of the air tubes (bronchi) in the lungs. Acute bronchitis causes these tubes to fill with mucus, which can make it hard to breathe. It can also cause coughing or wheezing. In adults, acute bronchitis usually goes away within 2 weeks. A cough caused by bronchitis may last up to 3 weeks. Smoking, allergies, and asthma can make the condition worse. Repeated episodes of bronchitis may cause further lung problems, such as chronic obstructive pulmonary disease (COPD). What are the causes? This condition can be caused by germs and by substances that irritate the lungs, including:  Cold and flu viruses. This condition is most often caused by the same virus that causes a cold.  Bacteria.  Exposure to tobacco smoke, dust, fumes, and air pollution. What increases the risk? This condition is more likely to develop in people who:  Have close contact with someone with acute bronchitis.  Are exposed to lung irritants, such as tobacco smoke, dust, fumes, and vapors.  Have a weak immune system.  Have a respiratory condition such as asthma. What are the signs or symptoms? Symptoms of this condition include:  A cough.  Coughing up clear, yellow, or green mucus.  Wheezing.  Chest congestion.  Shortness of breath.  A fever.  Body aches.  Chills.  A sore throat. How is this diagnosed? This condition is usually diagnosed with a physical exam. During the exam, your health care provider may order tests, such as chest X-rays, to rule out other conditions. He or she may also:  Test a sample of your mucus for bacterial infection.  Check the level of oxygen in your blood. This is done to check for pneumonia.  Do a chest X-ray or lung function testing to rule out pneumonia and other conditions.  Perform blood tests. Your health care provider will also ask about your symptoms and medical history. How is this treated? Most cases  of acute bronchitis clear up over time without treatment. Your health care provider may recommend:  Drinking more fluids. Drinking more makes your mucus thinner, which may make it easier to breathe.  Taking a medicine for a fever or cough.  Taking an antibiotic medicine.  Using an inhaler to help improve shortness of breath and to control a cough.  Using a cool mist vaporizer or humidifier to make it easier to breathe. Follow these instructions at home: Medicines  Take over-the-counter and prescription medicines only as told by your health care provider.  If you were prescribed an antibiotic, take it as told by your health care provider. Do not stop taking the antibiotic even if you start to feel better. General instructions  Get plenty of rest.  Drink enough fluids to keep your urine clear or pale yellow.  Avoid smoking and secondhand smoke. Exposure to cigarette smoke or irritating chemicals will make bronchitis worse. If you smoke and you need help quitting, ask your health care provider. Quitting smoking will help your lungs heal faster.  Use an inhaler, cool mist vaporizer, or humidifier as told by your health care provider.  Keep all follow-up visits as told by your health care provider. This is important. How is this prevented? To lower your risk of getting this condition again:  Wash your hands often with soap and water. If soap and water are not available, use hand sanitizer.  Avoid contact with people who have cold symptoms.  Try not to touch your hands to your mouth, nose, or eyes.  Make sure to get the flu shot every year. Contact a health care provider if:  Your symptoms do not improve in 2 weeks of treatment. Get help right away if:  You cough up blood.  You have chest pain.  You have severe shortness of breath.  You become dehydrated.  You faint or keep feeling like you are going to faint.  You keep vomiting.  You have a severe headache.  Your  fever or chills gets worse. This information is not intended to replace advice given to you by your health care provider. Make sure you discuss any questions you have with your health care provider. Document Released: 01/26/2004 Document Revised: 07/13/2015 Document Reviewed: 06/08/2015 Elsevier Interactive Patient Education  2017 [place].   Please get your medications reviewed and adjusted by your Primary MD.  Please request your Primary MD to go over all Hospital Tests and Procedure/Radiological results at the follow up, please get all Hospital records sent to your Prim MD by signing hospital release before you go home.  If you had Pneumonia of Lung problems at the Hospital: Please get a 2 view Chest X ray done in 6-8 weeks after hospital discharge or sooner if instructed by your Primary MD.  If you have Congestive Heart Failure: Please call your Cardiologist or Primary MD anytime you have any of the following symptoms:  1) 3 pound weight gain in 24 hours or 5 pounds in 1 week  2) shortness of breath, with or without a dry hacking cough  3) swelling in the hands, feet or stomach  4) if you have to sleep on extra pillows at night in order to breathe  Follow cardiac low salt diet and 1.5 lit/day fluid restriction.  If you have diabetes Accuchecks 4 times/day, Once in AM empty stomach and then before each meal. Log in all results and show them to your primary doctor at your next visit. If any glucose reading is under 80 or above 300 call your primary MD immediately.  If you have Seizure/Convulsions/Epilepsy: Please do not drive, operate heavy machinery, participate in activities at heights or participate in high speed sports until you have seen by Primary MD or a Neurologist and advised to do so again.  If you had Gastrointestinal Bleeding: Please ask your Primary MD to check a complete blood count within one week of discharge or at your next visit. Your endoscopic/colonoscopic  biopsies that are pending at the time of discharge, will also need to followed by your Primary MD.  Get Medicines reviewed and adjusted. Please take all your medications with you for your next visit with your Primary MD  Please request your Primary MD to go over all hospital tests and procedure/radiological results at the follow up, please ask your Primary MD to get all Hospital records sent to his/her office.  If you experience worsening of your admission symptoms, develop shortness of breath, life threatening emergency, suicidal or homicidal thoughts you must seek medical attention immediately by calling 911 or calling your MD immediately  if symptoms less severe.  You must read complete instructions/literature along with all the possible adverse reactions/side effects for all the Medicines you take and that have been prescribed to you. Take any new Medicines after you have completely understood and accpet all the possible adverse reactions/side effects.   Do not drive or operate heavy machinery when taking Pain medications.   Do not take more than prescribed Pain, Sleep and Anxiety Medications  Special Instructions: If you  have smoked or chewed Tobacco  in the last 2 yrs please stop smoking, stop any regular Alcohol  and or any Recreational drug use.  Wear Seat belts while driving.  Please note You were cared for by a hospitalist during your hospital stay. If you have any questions about your discharge medications or the care you received while you were in the hospital after you are discharged, you can call the unit and asked to speak with the hospitalist on call if the hospitalist that took care of you is not available. Once you are discharged, your primary care physician will handle any further medical issues. Please note that NO REFILLS for any discharge medications will be authorized once you are discharged, as it is imperative that you return to your primary care physician (or establish a  relationship with a primary care physician if you do not have one) for your aftercare needs so that they can reassess your need for medications and monitor your lab values.  You can reach the hospitalist office at phone [phone] or fax [phone]   If you do not have a primary care physician, you can call [phone] for a physician referral.

## 2016-01-01 NOTE — Progress Notes (Signed)
Patient ambulated in hallway with family, walker and oxygen, back in room will monitor patient. [name], [name] rN

## 2016-01-01 NOTE — Discharge Summary (Addendum)
Physician Discharge Summary  [name] IHG:[id number] DOB: 05/06/1925  PCP: Kaleen Mask, MD  Admit date: 12/28/2015 Discharge date: 01/01/2016  Recommendations for Outpatient Follow-up:  1. Dr. Windle Guard, PCP in 5 days with repeat labs (CBC & BMP). Please follow final blood culture results that were sent from the hospital. 2. Dr. Kristeen Miss, Cardiology  Home Health: None. Patient declined home health PT. Equipment/Devices: None    Discharge Condition: Improved and stable.  CODE STATUS: DO NOT RESUSCITATE  Diet recommendation: Heart healthy diet.  Discharge Diagnoses:  Principal Problem:   Acute on chronic respiratory failure with hypoxia (HCC) Active Problems:   COPD (chronic obstructive pulmonary disease) (HCC)   Hypertension   Chronic systolic CHF (congestive heart failure) (HCC)   Sepsis (HCC)   Elevated troponin   Cerebrovascular accident (CVA) due to embolism of left middle cerebral artery (HCC)   HLD (hyperlipidemia)   PAF (paroxysmal atrial fibrillation) (HCC)   Chest pain   Acute respiratory failure (HCC)   AKI (acute kidney injury) (HCC)   Brief/Interim Summary: 82 y.o.femalewith medical history significant of hypertension, hyperlipidemia, COPD, GERD, hypothyroidism, stroke,sCHF, and deficiency anemia, PAF on Eliquis, whopresents with fever, productive cough, pleuritic chest pain, shortness of breath of 1 day duration. Although chest x-ray showed COPD and bronchitic changes, high index of suspicion for possible early stage pneumonia and admitted for further management. On further workup, no pneumonia. Likely acute viral bronchitis.   Assessment & Plan:   1. Acute on chronic hypoxic respiratory failure on home oxygen 2 L/m: Secondary to acute bronchitis. Initial chest x-ray showed bronchitic changes without infiltration. However given her productive cough, pleuritic chest pain and sepsis physiology, treated for possible pneumonia. Acute  respiratory failure resolved. Stable and back on home level oxygen. 2. Sepsis secondary to acute viral bronchitis: Patient met sepsis criteria on admission. Treated empirically with IV vancomycin and cefepime. Blood cultures 2: Negative to date. RSV panel negative. Influenza panel PCR A&B negative. Pro-calcitonin <0.10. Repeat chest x-ray 12/29 without pneumonia. Lactate normal. Urine streptococcal & Legionella antigen negative. Discontinued vancomycin and cefepime and transitioned to oral levofloxacin to complete total 7 days course for possible secondary bacterial infection.  2 nights ago, patient had bothersome dry hacking cough which subsided with scheduled Tessalon. Today significantly improved including cough which has decreased and denies dyspnea. Discussed extensively with patient and daughter and advised them that sometimes the cough can last for several days or even a couple of weeks and patient was advised to use Tessalon scheduled for a couple of days and then as needed. They verbalized understanding. 3. NSVT: Noted 26 beat NSVT, asymptomatic on telemetry on 12/28 at approximately 4 AM. Likely precipitated by acute illness. Potassium and magnesium replaced. Cardiology consultation and follow-up appreciated. Low-dose bisoprolol added and improved. 4. COPD: No clinical exacerbation. 5. Essential hypertension: Held lisinopril secondary to acute kidney injury which has resolved and will be resumed at discharge. Continue newly started bisoprolol. Controlled. 6. Mild acute kidney injury: Resolved. 7. Chronic combined systolic & diastolic CHF: 2-D echo 12/15/15 showed LVEF 45-50 percent. Clinically euvolemic. Resumed Lasix. 8. Elevated troponin: Flat trend. No ischemic type of chest pain. Chronic elevation. May be due to demand ischemia. No further workup per cardiology. 9. History of stroke: Continue aspirin. 10. HLD: Not on statins-unclear reasons. 11. Paroxysmal A. Fib: CHA2DS2-VASc Scoreis 7.  Remains in sinus rhythm. Continue Eliquis. 12. Anemia: Stable. Follow CBCs periodically.   Consultants:   Cardiology  Procedures:   None  Discharge  Instructions  Discharge Instructions    (HEART FAILURE PATIENTS) Call MD:  Anytime you have any of the following symptoms: 1) 3 pound weight gain in 24 hours or 5 pounds in 1 week 2) shortness of breath, with or without a dry hacking cough 3) swelling in the hands, feet or stomach 4) if you have to sleep on extra pillows at night in order to breathe.    Complete by:  As directed    Call MD for:  difficulty breathing, headache or visual disturbances    Complete by:  As directed    Call MD for:  extreme fatigue    Complete by:  As directed    Call MD for:  persistant dizziness or light-headedness    Complete by:  As directed    Call MD for:  severe uncontrolled pain    Complete by:  As directed    Call MD for:  temperature >100.4    Complete by:  As directed    Diet - low sodium heart healthy    Complete by:  As directed    Increase activity slowly    Complete by:  As directed        Medication List    STOP taking these medications   aspirin 325 MG tablet   atorvastatin 40 MG tablet Commonly known as:  LIPITOR   predniSONE 10 MG tablet Commonly known as:  DELTASONE     TAKE these medications   acetaminophen 500 MG tablet Commonly known as:  TYLENOL Take 1,000 mg by mouth every 6 (six) hours as needed for mild pain. Reported on 07/14/2015   albuterol (2.5 MG/3ML) 0.083% nebulizer solution Commonly known as:  PROVENTIL Take 3 mLs (2.5 mg total) by nebulization every 6 (six) hours as needed for wheezing or shortness of breath.   albuterol 108 (90 Base) MCG/ACT inhaler Commonly known as:  PROVENTIL HFA;VENTOLIN HFA Inhale 2 puffs into the lungs every 6 (six) hours as needed for wheezing.   apixaban 5 MG Tabs tablet Commonly known as:  ELIQUIS Take 1 tablet (5 mg total) by mouth 2 (two) times daily.   benzonatate  200 MG capsule Commonly known as:  TESSALON Take 1 capsule (200 mg total) by mouth 3 (three) times daily. 3 times daily as scheduled for 3-4 days,then 3 times daily as needed for cough   bisoprolol 5 MG tablet Commonly known as:  ZEBETA Take 1 tablet (5 mg total) by mouth daily. Start taking on:  01/02/2016   cholecalciferol 1000 units tablet Commonly known as:  VITAMIN D Take 1,000 Units by mouth daily.   ferrous sulfate 325 (65 FE) MG tablet Take 325 mg by mouth daily as needed (iron supplement).   furosemide 40 MG tablet Commonly known as:  LASIX Take 1 tablet (40 mg total) by mouth daily.   levofloxacin 500 MG tablet Commonly known as:  LEVAQUIN Take 1 tablet (500 mg total) by mouth daily. Start taking on:  01/02/2016   lisinopril 20 MG tablet Commonly known as:  PRINIVIL,ZESTRIL Take 1 tablet (20 mg total) by mouth daily.   multivitamin-prenatal 27-0.8 MG Tabs tablet Take 1 tablet by mouth daily at 12 noon.   polyethylene glycol packet Commonly known as:  MIRALAX / GLYCOLAX Take 17 g by mouth daily.   polyvinyl alcohol 1.4 % ophthalmic solution Commonly known as:  LIQUIFILM TEARS Place 1 drop into both eyes as needed for dry eyes.   SYMBICORT IN Inhale 2 puffs into the lungs  2 (two) times daily.      Follow-up Information    Leonard Downing, MD. Schedule an appointment as soon as possible for a visit in 5 day(s).   Specialty:  Family Medicine Why:  To be seen with repeat labs (CBC & BMP). Contact information: [place] [address] [phone]        Mertie Moores, MD. Schedule an appointment as soon as possible for a visit.   Specialty:  Cardiology Contact information: [address] [place] [address] [phone]          Allergies  Allergen Reactions  . Indomethacin Anaphylaxis and Other (See Comments)    "took it fine for awhile; one day I stopped breathing and I ended up in the hospital"  (01/02/2012) Pt has tolerated aspirin    Procedures/Studies: Dg Chest 2 View  Result Date: 12/30/2015 CLINICAL DATA:  Wheezing/hypoxiaPt states that she came into the hospital with SOB and chest pain. Her chest pain is gone, but she still has SOBHx of HTN- on meds, ex-smoker, COPD, CHF, PNA EXAM: CHEST  2 VIEW COMPARISON:  12/28/2015 FINDINGS: The cardiac silhouette is mildly enlarged. No mediastinal or hilar masses. No evidence of adenopathy. There are prominent bronchovascular markings bilaterally, stable. No evidence of pneumonia. Minimal pleural effusions blunt the posterior costophrenic sulci. No pneumothorax. The skeletal structures are demineralized. There is a mild compression deformity of a mid thoracic vertebra, old. IMPRESSION: 1. No acute cardiopulmonary disease. 2. COPD/chronic lung changes and mild cardiomegaly. Electronically Signed   By: Lajean Manes M.D.   On: 12/30/2015 08:08   Dg Chest 2 View  Result Date: 12/15/2015 CLINICAL DATA:  Increasing shortness of breath and congestion. EXAM: CHEST  2 VIEW COMPARISON:  11/08/2015 and prior exams FINDINGS: Cardiomegaly noted. New interstitial opacities likely represent edema. Trace bilateral pleural effusions and minimal bibasilar atelectasis noted. There is no evidence of pneumothorax. No acute bony abnormalities are identified. IMPRESSION: Cardiomegaly with interstitial pulmonary edema and trace bilateral pleural effusions. Electronically Signed   By: Margarette Canada M.D.   On: 12/15/2015 11:10   Dg Chest Portable 1 View  Result Date: 12/28/2015 CLINICAL DATA:  Central chest pain, shortness of breath, cough EXAM: PORTABLE CHEST 1 VIEW COMPARISON:  None. FINDINGS: There is hyperinflation of the lungs compatible with COPD. Heart is upper limits normal in size. Mild peribronchial thickening and interstitial prominence, likely chronic bronchitis. No confluent opacities or effusions. Previously seen interstitial edema has improved/resolved.  IMPRESSION: COPD/chronic bronchitic changes. Electronically Signed   By: Rolm Baptise M.D.   On: 12/28/2015 19:27      Subjective: Feels much better today and asking to go home. Cough significantly improved and was able to sleep last night. Breathing is back to baseline. No other complaints reported. Daughter at bedside.   Discharge Exam:  Vitals:   12/31/15 2139 12/31/15 2203 01/01/16 0557 01/01/16 1000  BP:  (!) 131/54 (!) 146/56 (!) 119/52  Pulse:  68 70 62  Resp:  18 20   Temp:  99.3 F (37.4 C) 98.2 F (36.8 C) 98.5 F (36.9 C)  TempSrc:  Oral Oral   SpO2: 95% 96% 94%   Weight:      Height:        General exam: Pleasant elderly female sitting up in chair this morning without distress. Respiratory system: clear to auscultation except occasional basal crackles. Respiratory effort normal. Cardiovascular system: S1 & S2 heard, RRR. No JVD, murmurs, rubs, gallops or  clicks. No pedal edema. Telemetry: SB in the 50s-SR in the 60s.  Gastrointestinal system: Abdomen is nondistended, soft and nontender. No organomegaly or masses felt. Normal bowel sounds heard. Central nervous system: Alert and oriented. No focal neurological deficits. Extremities: Symmetric 5 x 5 power. Skin: No rashes, lesions or ulcers Psychiatry: Judgement and insight appear normal. Mood & affect appropriate.     The results of significant diagnostics from this hospitalization (including imaging, microbiology, ancillary and laboratory) are listed below for reference.     Microbiology: Recent Results (from the past 240 hour(s))  Blood culture (routine x 2)     Status: None (Preliminary result)   Collection Time: 12/28/15  7:09 PM  Result Value Ref Range Status   Specimen Description BLOOD LEFT ANTECUBITAL  Final   Special Requests BOTTLES DRAWN AEROBIC AND ANAEROBIC 5CC  Final   Culture NO GROWTH 3 DAYS  Final   Report Status PENDING  Incomplete  Blood culture (routine x 2)     Status: None (Preliminary  result)   Collection Time: 12/28/15  8:25 PM  Result Value Ref Range Status   Specimen Description BLOOD RIGHT ANTECUBITAL  Final   Special Requests BOTTLES DRAWN AEROBIC AND ANAEROBIC 5CC  Final   Culture NO GROWTH 3 DAYS  Final   Report Status PENDING  Incomplete  Respiratory Panel by PCR     Status: None   Collection Time: 12/28/15 11:51 PM  Result Value Ref Range Status   Adenovirus NOT DETECTED NOT DETECTED Final   Coronavirus 229E NOT DETECTED NOT DETECTED Final   Coronavirus HKU1 NOT DETECTED NOT DETECTED Final   Coronavirus NL63 NOT DETECTED NOT DETECTED Final   Coronavirus OC43 NOT DETECTED NOT DETECTED Final   Metapneumovirus NOT DETECTED NOT DETECTED Final   Rhinovirus / Enterovirus NOT DETECTED NOT DETECTED Final   Influenza A NOT DETECTED NOT DETECTED Final   Influenza B NOT DETECTED NOT DETECTED Final   Parainfluenza Virus 1 NOT DETECTED NOT DETECTED Final   Parainfluenza Virus 2 NOT DETECTED NOT DETECTED Final   Parainfluenza Virus 3 NOT DETECTED NOT DETECTED Final   Parainfluenza Virus 4 NOT DETECTED NOT DETECTED Final   Respiratory Syncytial Virus NOT DETECTED NOT DETECTED Final   Bordetella pertussis NOT DETECTED NOT DETECTED Final   Chlamydophila pneumoniae NOT DETECTED NOT DETECTED Final   Mycoplasma pneumoniae NOT DETECTED NOT DETECTED Final     Labs: BNP (last 3 results)  Recent Labs  08/24/15 0247 12/15/15 0931 12/28/15 1910  BNP 479.9* 529.4* 258.5*   Basic Metabolic Panel:  Recent Labs Lab 12/28/15 1910 12/29/15 1630 12/30/15 0442  NA 137  --  135  K 3.9  --  4.5  CL 100*  --  102  CO2 26  --  27  GLUCOSE 124*  --  130*  BUN 20  --  12  CREATININE 1.12*  --  0.85  CALCIUM 8.5*  --  8.2*  MG  --  2.0  --    CBC:  Recent Labs Lab 12/28/15 1910 12/30/15 0442 12/31/15 0310  WBC 14.7* 9.3 7.7  NEUTROABS 12.8*  --   --   HGB 9.8* 9.1* 8.9*  HCT 29.9* 25.7* 24.9*  MCV 91.4 96.6 96.5  PLT 211 209 224   Cardiac Enzymes:  Recent  Labs Lab 12/28/15 2225 12/29/15 0345 12/29/15 0927  TROPONINI 0.07* 0.07* 0.06*   Hgb A1c  Recent Labs  12/30/15 0442  HGBA1C 5.3   Lipid Profile  Recent Labs  12/30/15 0442  CHOL 147  HDL 53  LDLCALC 80  TRIG 69  CHOLHDL 2.8   Urinalysis    Component Value Date/Time   COLORURINE YELLOW 12/28/2015 2220   APPEARANCEUR CLOUDY (A) 12/28/2015 2220   LABSPEC 1.025 12/28/2015 2220   LABSPEC 1.010 10/29/2013 1105   PHURINE 6.0 12/28/2015 2220   GLUCOSEU NEGATIVE 12/28/2015 2220   GLUCOSEU Negative 10/29/2013 1105   HGBUR NEGATIVE 12/28/2015 2220   BILIRUBINUR NEGATIVE 12/28/2015 2220   BILIRUBINUR Negative 10/29/2013 1105   KETONESUR NEGATIVE 12/28/2015 2220   PROTEINUR 100 (A) 12/28/2015 2220   UROBILINOGEN 1.0 02/13/2014 1137   UROBILINOGEN 0.2 10/29/2013 1105   NITRITE NEGATIVE 12/28/2015 2220   LEUKOCYTESUR LARGE (A) 12/28/2015 2220   LEUKOCYTESUR Trace 10/29/2013 1105   Discussed in detail with patient's daughter at bedside. Updated care and answered questions.  Patient's new prescriptions were initially sent electronically to her listed pharmacy on Epic. Subsequently RN advised that her pharmacy is currently closed and will not open until Tuesday of next week. Thereby prescriptions were printed for bisoprolol, levofloxacin and Tessalon. Patient and family were advised that they should not pick up duplicate medications of levofloxacin and Tessalon but can use bisoprolol as a refill.   Time coordinating discharge: Over 30 minutes  SIGNED:  Vernell Leep, MD, FACP, [place]. [facility] Pager [phone]  If 7PM-7AM, please contact night-coverage [email or web address] Password [email or web address] 01/01/2016, 11:25 AM

## 2016-01-01 NOTE — Progress Notes (Signed)
Patient and family given discharge instructions medication list and prescriptions sent to personal pharmacy. Patients personal pharmacy closed today 01/01/16 and does not open till Tuesday 01/03/2016. Dr. Algis Liming made aware and will print and sign paper prescriptions for patient to take to another pharmacy that is open today. All questions were answered. Will discharge home as ordered. [name], [name] RN

## 2016-01-02 LAB — CULTURE, BLOOD (ROUTINE X 2)
CULTURE: NO GROWTH
Culture: NO GROWTH

## 2016-01-26 ENCOUNTER — Ambulatory Visit
Admission: RE | Admit: 2016-01-26 | Discharge: 2016-01-26 | Disposition: A | Discharge status: Home / Self Care | Payer: Medicare Other | Source: Ambulatory Visit | Attending: Radiation Oncology | Admitting: Radiation Oncology

## 2016-01-26 ENCOUNTER — Encounter: Payer: Self-pay | Admitting: Radiation Oncology

## 2016-01-26 VITALS — BP 158/67 | HR 58 | Temp 98.2°F | Ht 60.0 in | Wt 158.4 lb

## 2016-01-26 DIAGNOSIS — C801 Malignant (primary) neoplasm, unspecified: Secondary | ICD-10-CM | POA: Diagnosis present

## 2016-01-26 DIAGNOSIS — J440 Chronic obstructive pulmonary disease with acute lower respiratory infection: Secondary | ICD-10-CM | POA: Diagnosis not present

## 2016-01-26 DIAGNOSIS — IMO0002 Reserved for concepts with insufficient information to code with codable children: Secondary | ICD-10-CM

## 2016-01-26 DIAGNOSIS — Z923 Personal history of irradiation: Secondary | ICD-10-CM | POA: Diagnosis not present

## 2016-01-26 DIAGNOSIS — Z79899 Other long term (current) drug therapy: Secondary | ICD-10-CM | POA: Diagnosis not present

## 2016-01-26 DIAGNOSIS — Z888 Allergy status to other drugs, medicaments and biological substances status: Secondary | ICD-10-CM | POA: Diagnosis not present

## 2016-01-26 DIAGNOSIS — C799 Secondary malignant neoplasm of unspecified site: Secondary | ICD-10-CM

## 2016-01-26 DIAGNOSIS — C4492 Squamous cell carcinoma of skin, unspecified: Secondary | ICD-10-CM

## 2016-01-26 NOTE — Progress Notes (Signed)
[name] is here for follow up with her daughter.  She denies having pain.  She reports having some urinary incontinence.  She denies having any bowel issues or vaginal bleeding.  She was hospitalized recently for bronchitis.  She reports having fatigue with activity.    BP (!) 158/67 (BP Location: Right Arm, Patient Position: Sitting)   Pulse (!) 58   Temp 98.2 F (36.8 C) (Oral)   Ht 5' (1.524 m)   Wt 158 lb 6.4 oz (71.8 kg)   SpO2 97% Comment: 2l  BMI 30.94 kg/m    Wt Readings from Last 3 Encounters:  01/26/16 158 lb 6.4 oz (71.8 kg)  12/31/15 159 lb (72.1 kg)  12/17/15 155 lb 9.6 oz (70.6 kg)

## 2016-01-26 NOTE — Progress Notes (Signed)
Radiation Oncology         [phone] ________________________________  Name: [name] MRN: [id number]  Date: 01/26/2016  DOB: 1925/04/24    Follow-Up Visit Note  CC: Leonard Downing, MD  Everitt Amber, MD    ICD-9-CM ICD-10-CM   1. Squamous cell carcinoma of unknown origin (HCC) 199.1 C80.1   2. Squamous cell carcinoma, metastatic (HCC) 199.1 C79.9     C80.1     Diagnosis:  Squamous cell carcinoma of unknown origin presenting in the bladder and upper vaginal area  Interval Since Last Radiation: 2 years and 4 months   08/10/13 - 09/15/13: 55 gray in 25 fractions (2.2 gray per fraction) treatment was directed at the vaginal/bladder mass  Narrative:  The patient returns today for routine follow-up. The patient presented to the ED in on 12/15/15 for acute viral bronchitis and secondary hypoxic respiratory failure and sepsis from the bronchitis. She was discharged on 01/01/16. She denies having pain, bowel issues, or vaginal bleeding. She reports having some urinary incontinence and fatigue with activity. She has been on oxygen for the past year.  Pap smear from 07/14/15 was negative.  ALLERGIES:  is allergic to indomethacin.  Meds: Current Outpatient Prescriptions  Medication Sig Dispense Refill  . acetaminophen (TYLENOL) 500 MG tablet Take 1,000 mg by mouth every 6 (six) hours as needed for mild pain. Reported on 07/14/2015    . albuterol (PROVENTIL HFA;VENTOLIN HFA) 108 (90 BASE) MCG/ACT inhaler Inhale 2 puffs into the lungs every 6 (six) hours as needed for wheezing.    Marland Kitchen albuterol (PROVENTIL) (2.5 MG/3ML) 0.083% nebulizer solution Take 3 mLs (2.5 mg total) by nebulization every 6 (six) hours as needed for wheezing or shortness of breath. 75 mL 12  . apixaban (ELIQUIS) 5 MG TABS tablet Take 1 tablet (5 mg total) by mouth 2 (two) times daily. 120 tablet 0  . bisoprolol (ZEBETA) 5 MG tablet Take 1 tablet (5 mg total) by mouth daily. 30 tablet 0  . Budesonide-Formoterol  Fumarate (SYMBICORT IN) Inhale 2 puffs into the lungs 2 (two) times daily.    . cholecalciferol (VITAMIN D) 1000 units tablet Take 1,000 Units by mouth daily.    . ferrous sulfate 325 (65 FE) MG tablet Take 325 mg by mouth daily as needed (iron supplement).    . furosemide (LASIX) 40 MG tablet Take 1 tablet (40 mg total) by mouth daily. 30 tablet 0  . polyethylene glycol (MIRALAX / GLYCOLAX) packet Take 17 g by mouth daily.    . polyvinyl alcohol (LIQUIFILM TEARS) 1.4 % ophthalmic solution Place 1 drop into both eyes as needed for dry eyes.    . Prenatal Vit-Fe Fumarate-FA (MULTIVITAMIN-PRENATAL) 27-0.8 MG TABS tablet Take 1 tablet by mouth daily at 12 noon.    . benzonatate (TESSALON) 200 MG capsule Take 1 capsule (200 mg total) by mouth 3 (three) times daily. 3 times daily as scheduled for 3-4 days,then 3 times daily as needed for cough (Patient not taking: Reported on 01/26/2016) 30 capsule 0  . lisinopril (PRINIVIL,ZESTRIL) 20 MG tablet Take 1 tablet (20 mg total) by mouth daily. (Patient not taking: Reported on 01/26/2016) 30 tablet 11   No current facility-administered medications for this encounter.     Physical Findings: The patient is in no acute distress. Patient is alert and oriented.  height is 5' (1.524 m) and weight is 158 lb 6.4 oz (71.8 kg). Her oral temperature is 98.2 F (36.8 C). Her blood pressure is 158/67 (abnormal)  and her pulse is 58 (abnormal). Her oxygen saturation is 97%.    Lungs are clear to auscultation bilaterally. 2L of oxygen via nasal cannula. Heart has regular rate and rhythm. No palpable cervical, supraclavicular, or axillary adenopathy. Significant swelling of her right thyroid gland.  Abdomen soft, non-tender with normal bowel sounds. Reducible hernia in the abdominal region.  Gynecological: On pelvic examination the external genitalia were unremarkable. A speculum exam was performed. There are no mucosal lesions noted in the vaginal vault. On bimanual and  rectovaginal examination there were no pelvic masses appreciated.  Lab Findings: Lab Results  Component Value Date   WBC 7.7 12/31/2015   HGB 8.9 (L) 12/31/2015   HCT 24.9 (L) 12/31/2015   MCV 96.5 12/31/2015   PLT 224 12/31/2015    Radiographic Findings: Dg Chest 2 View  Result Date: 12/30/2015 CLINICAL DATA:  Wheezing/hypoxiaPt states that she came into the hospital with SOB and chest pain. Her chest pain is gone, but she still has SOBHx of HTN- on meds, ex-smoker, COPD, CHF, PNA EXAM: CHEST  2 VIEW COMPARISON:  12/28/2015 FINDINGS: The cardiac silhouette is mildly enlarged. No mediastinal or hilar masses. No evidence of adenopathy. There are prominent bronchovascular markings bilaterally, stable. No evidence of pneumonia. Minimal pleural effusions blunt the posterior costophrenic sulci. No pneumothorax. The skeletal structures are demineralized. There is a mild compression deformity of a mid thoracic vertebra, old. IMPRESSION: 1. No acute cardiopulmonary disease. 2. COPD/chronic lung changes and mild cardiomegaly. Electronically Signed   By: Lajean Manes M.D.   On: 12/30/2015 08:08   Dg Chest Portable 1 View  Result Date: 12/28/2015 CLINICAL DATA:  Central chest pain, shortness of breath, cough EXAM: PORTABLE CHEST 1 VIEW COMPARISON:  None. FINDINGS: There is hyperinflation of the lungs compatible with COPD. Heart is upper limits normal in size. Mild peribronchial thickening and interstitial prominence, likely chronic bronchitis. No confluent opacities or effusions. Previously seen interstitial edema has improved/resolved. IMPRESSION: COPD/chronic bronchitic changes. Electronically Signed   By: Rolm Baptise M.D.   On: 12/28/2015 19:27    Impression:  No evidence of recurrence on physical exam today. No Pap smear was obtained during this exam.  Plan:  Routine follow up with radiation oncology in 6 months. I encouraged her to follow up with  gyn/onc. -----------------------------------  Blair Promise, PhD, MD  This document serves as a record of services personally performed by Gery Pray, MD. It was created on his behalf by Darcus Austin, a trained medical scribe. The creation of this record is based on the scribe's personal observations and the provider's statements to them. This document has been checked and approved by the attending provider.

## 2016-03-04 ENCOUNTER — Encounter (HOSPITAL_COMMUNITY): Payer: Self-pay

## 2016-03-04 ENCOUNTER — Observation Stay (HOSPITAL_COMMUNITY)
Admission: EM | Admit: 2016-03-04 | Discharge: 2016-03-06 | Disposition: A | Discharge status: Home / Self Care | Payer: Medicare Other | Attending: Internal Medicine | Admitting: Internal Medicine

## 2016-03-04 ENCOUNTER — Emergency Department (HOSPITAL_COMMUNITY): Payer: Medicare Other

## 2016-03-04 DIAGNOSIS — Z79899 Other long term (current) drug therapy: Secondary | ICD-10-CM | POA: Insufficient documentation

## 2016-03-04 DIAGNOSIS — J189 Pneumonia, unspecified organism: Secondary | ICD-10-CM | POA: Diagnosis present

## 2016-03-04 DIAGNOSIS — Z7951 Long term (current) use of inhaled steroids: Secondary | ICD-10-CM | POA: Diagnosis not present

## 2016-03-04 DIAGNOSIS — Z6831 Body mass index (BMI) 31.0-31.9, adult: Secondary | ICD-10-CM | POA: Insufficient documentation

## 2016-03-04 DIAGNOSIS — Z8673 Personal history of transient ischemic attack (TIA), and cerebral infarction without residual deficits: Secondary | ICD-10-CM | POA: Diagnosis not present

## 2016-03-04 DIAGNOSIS — Z8551 Personal history of malignant neoplasm of bladder: Secondary | ICD-10-CM | POA: Insufficient documentation

## 2016-03-04 DIAGNOSIS — R0602 Shortness of breath: Secondary | ICD-10-CM | POA: Diagnosis present

## 2016-03-04 DIAGNOSIS — E663 Overweight: Secondary | ICD-10-CM | POA: Diagnosis present

## 2016-03-04 DIAGNOSIS — J441 Chronic obstructive pulmonary disease with (acute) exacerbation: Secondary | ICD-10-CM

## 2016-03-04 DIAGNOSIS — Z87891 Personal history of nicotine dependence: Secondary | ICD-10-CM | POA: Diagnosis not present

## 2016-03-04 DIAGNOSIS — I480 Paroxysmal atrial fibrillation: Secondary | ICD-10-CM | POA: Diagnosis present

## 2016-03-04 DIAGNOSIS — Z9981 Dependence on supplemental oxygen: Secondary | ICD-10-CM | POA: Insufficient documentation

## 2016-03-04 DIAGNOSIS — J9610 Chronic respiratory failure, unspecified whether with hypoxia or hypercapnia: Secondary | ICD-10-CM | POA: Diagnosis present

## 2016-03-04 DIAGNOSIS — J449 Chronic obstructive pulmonary disease, unspecified: Secondary | ICD-10-CM | POA: Diagnosis present

## 2016-03-04 DIAGNOSIS — J440 Chronic obstructive pulmonary disease with acute lower respiratory infection: Principal | ICD-10-CM | POA: Insufficient documentation

## 2016-03-04 DIAGNOSIS — I110 Hypertensive heart disease with heart failure: Secondary | ICD-10-CM | POA: Diagnosis not present

## 2016-03-04 DIAGNOSIS — Z7901 Long term (current) use of anticoagulants: Secondary | ICD-10-CM | POA: Insufficient documentation

## 2016-03-04 DIAGNOSIS — I10 Essential (primary) hypertension: Secondary | ICD-10-CM | POA: Diagnosis present

## 2016-03-04 DIAGNOSIS — Z66 Do not resuscitate: Secondary | ICD-10-CM | POA: Diagnosis not present

## 2016-03-04 DIAGNOSIS — I5042 Chronic combined systolic (congestive) and diastolic (congestive) heart failure: Secondary | ICD-10-CM | POA: Diagnosis present

## 2016-03-04 DIAGNOSIS — Z8589 Personal history of malignant neoplasm of other organs and systems: Secondary | ICD-10-CM

## 2016-03-04 DIAGNOSIS — Z9889 Other specified postprocedural states: Secondary | ICD-10-CM

## 2016-03-04 DIAGNOSIS — Z8544 Personal history of malignant neoplasm of other female genital organs: Secondary | ICD-10-CM | POA: Diagnosis not present

## 2016-03-04 LAB — BASIC METABOLIC PANEL
Anion gap: 9 (ref 5–15)
BUN: 16 mg/dL (ref 6–20)
CALCIUM: 8.6 mg/dL — AB (ref 8.9–10.3)
CO2: 27 mmol/L (ref 22–32)
Chloride: 101 mmol/L (ref 101–111)
Creatinine, Ser: 1.11 mg/dL — ABNORMAL HIGH (ref 0.44–1.00)
GFR calc Af Amer: 50 mL/min — ABNORMAL LOW (ref >60)
GFR, EST NON AFRICAN AMERICAN: 43 mL/min — AB (ref >60)
GLUCOSE: 154 mg/dL — AB (ref 65–99)
POTASSIUM: 3.5 mmol/L (ref 3.5–5.1)
Sodium: 137 mmol/L (ref 135–145)

## 2016-03-04 LAB — I-STAT TROPONIN, ED: Troponin i, poc: 0.02 ng/mL (ref 0.00–0.08)

## 2016-03-04 LAB — BRAIN NATRIURETIC PEPTIDE: B Natriuretic Peptide: 564.1 pg/mL — ABNORMAL HIGH (ref 0.0–100.0)

## 2016-03-04 LAB — CBC
HEMATOCRIT: 30.4 % — AB (ref 36.0–46.0)
HEMOGLOBIN: 10.5 g/dL — AB (ref 12.0–15.0)
MCH: 32.8 pg (ref 26.0–34.0)
MCHC: 34.5 g/dL (ref 30.0–36.0)
MCV: 95 fL (ref 78.0–100.0)
Platelets: 242 10*3/uL (ref 150–400)
RBC: 3.2 MIL/uL — ABNORMAL LOW (ref 3.87–5.11)
RDW: 13 % (ref 11.5–15.5)
WBC: 9.5 10*3/uL (ref 4.0–10.5)

## 2016-03-04 LAB — INFLUENZA PANEL BY PCR (TYPE A & B)
INFLAPCR: NEGATIVE
INFLBPCR: NEGATIVE

## 2016-03-04 LAB — I-STAT CG4 LACTIC ACID, ED: Lactic Acid, Venous: 1.28 mmol/L (ref 0.5–1.9)

## 2016-03-04 MED ORDER — ALBUTEROL SULFATE (2.5 MG/3ML) 0.083% IN NEBU: 2.5000 mg | INHALATION_SOLUTION | RESPIRATORY_TRACT | Status: DC | PRN

## 2016-03-04 MED ORDER — VANCOMYCIN HCL IN DEXTROSE 1-5 GM/200ML-% IV SOLN
1000.0000 mg | Freq: Once | INTRAVENOUS | Status: DC
  Filled 2016-03-04: qty 200

## 2016-03-04 MED ORDER — GUAIFENESIN ER 600 MG PO TB12
600.0000 mg | ORAL_TABLET | Freq: Two times a day (BID) | ORAL | Status: DC
  Administered 2016-03-04 – 2016-03-06 (×4): 600 mg via ORAL
  Filled 2016-03-04 (×4): qty 1

## 2016-03-04 MED ORDER — IPRATROPIUM-ALBUTEROL 0.5-2.5 (3) MG/3ML IN SOLN
3.0000 mL | Freq: Once | RESPIRATORY_TRACT | Status: AC
  Administered 2016-03-04: 3 mL via RESPIRATORY_TRACT
  Filled 2016-03-04: qty 3

## 2016-03-04 MED ORDER — PREDNISONE 20 MG PO TABS
40.0000 mg | ORAL_TABLET | Freq: Every day | ORAL | Status: DC
  Administered 2016-03-05 – 2016-03-06 (×2): 40 mg via ORAL
  Filled 2016-03-04 (×2): qty 2

## 2016-03-04 MED ORDER — VANCOMYCIN HCL IN DEXTROSE 1-5 GM/200ML-% IV SOLN: 1000.0000 mg | INTRAVENOUS | Status: DC

## 2016-03-04 MED ORDER — MOMETASONE FURO-FORMOTEROL FUM 200-5 MCG/ACT IN AERO
2.0000 | INHALATION_SPRAY | Freq: Two times a day (BID) | RESPIRATORY_TRACT | Status: DC
  Administered 2016-03-04 – 2016-03-06 (×4): 2 via RESPIRATORY_TRACT
  Filled 2016-03-04: qty 8.8

## 2016-03-04 MED ORDER — FUROSEMIDE 40 MG PO TABS
40.0000 mg | ORAL_TABLET | Freq: Every day | ORAL | Status: DC
  Administered 2016-03-05 – 2016-03-06 (×2): 40 mg via ORAL
  Filled 2016-03-04 (×2): qty 1

## 2016-03-04 MED ORDER — POLYETHYLENE GLYCOL 3350 17 G PO PACK: 17.0000 g | PACK | Freq: Every day | ORAL | Status: DC | PRN

## 2016-03-04 MED ORDER — BISOPROLOL FUMARATE 5 MG PO TABS
5.0000 mg | ORAL_TABLET | Freq: Every day | ORAL | Status: DC
  Administered 2016-03-05 – 2016-03-06 (×2): 5 mg via ORAL
  Filled 2016-03-04 (×2): qty 1

## 2016-03-04 MED ORDER — IPRATROPIUM-ALBUTEROL 0.5-2.5 (3) MG/3ML IN SOLN
3.0000 mL | Freq: Four times a day (QID) | RESPIRATORY_TRACT | Status: DC
  Administered 2016-03-04 – 2016-03-05 (×3): 3 mL via RESPIRATORY_TRACT
  Filled 2016-03-04 (×3): qty 3

## 2016-03-04 MED ORDER — SODIUM CHLORIDE 0.9 % IV BOLUS (SEPSIS)
500.0000 mL | Freq: Once | INTRAVENOUS | Status: AC
  Administered 2016-03-04: 500 mL via INTRAVENOUS

## 2016-03-04 MED ORDER — DEXTROSE 5 % IV SOLN
1.0000 g | INTRAVENOUS | Status: DC
  Filled 2016-03-04: qty 1

## 2016-03-04 MED ORDER — DEXTROSE 5 % IV SOLN
1.0000 g | Freq: Three times a day (TID) | INTRAVENOUS | Status: DC
  Filled 2016-03-04: qty 1

## 2016-03-04 MED ORDER — DEXTROSE 5 % IV SOLN
2.0000 g | Freq: Once | INTRAVENOUS | Status: AC
  Administered 2016-03-04: 2 g via INTRAVENOUS
  Filled 2016-03-04: qty 2

## 2016-03-04 MED ORDER — APIXABAN 5 MG PO TABS
5.0000 mg | ORAL_TABLET | Freq: Two times a day (BID) | ORAL | Status: DC
  Administered 2016-03-04 – 2016-03-06 (×4): 5 mg via ORAL
  Filled 2016-03-04 (×4): qty 1

## 2016-03-04 MED ORDER — ACETAMINOPHEN 500 MG PO TABS
1000.0000 mg | ORAL_TABLET | Freq: Four times a day (QID) | ORAL | Status: DC | PRN
  Administered 2016-03-04 – 2016-03-05 (×3): 1000 mg via ORAL
  Filled 2016-03-04 (×3): qty 2

## 2016-03-04 NOTE — ED Triage Notes (Signed)
Patient complains of increasing shortness of breath and cough x 2 days, states that she is having to turn her oxygen up to 3l due to the increased congestion, denies CP. No acute distrress

## 2016-03-04 NOTE — ED Provider Notes (Signed)
Emergency Department Provider Note   I have reviewed the triage vital signs and the nursing notes.   HISTORY  Chief Complaint Shortness of Breath and Chills   HPI [name] is a 83 y.o. female with PMH of COPD (2L home O2), CHF, HTN, and admission in 12/2015 for PNA resents to the emergency department for evaluation of dyspnea and productive cough over the past 2-3 days. She reports some associated weakness and poor appetite. Family denies any confusion. She is not had increased her oxygen usage. She notes frequent productive coughing that is worse with exertion. Denies fever but has had some shaking chills. No vomiting or diarrhea. No sore throat or headache. No radiation of symptoms. No pain. No modifying factors. Symptoms are severe and gradually worsening.    Past Medical History:  Diagnosis Date  . Anemia    years ago  . Arthritis    "right shoulder" (01/02/2012)  . Bladder mass   . CAP (community acquired pneumonia) 01/02/2012   Lower lobe   . CHF (congestive heart failure) ([place])   . COPD (chronic obstructive pulmonary disease) ([place])   . Difficulty sleeping   . Diverticulitis of colon with perforation 01/14/2012   That is post sigmoid resection with [facility] pouch and end colostomy   . GERD (gastroesophageal reflux disease)    occasional  . Goiter    radioactive iodine ablation/notes 01/02/2012  . History of kidney stones    X 1  . Hypertension   . Hypothyroidism    "I have taken Synthroid before" (01/02/2012)   HAS HAD RADIOACTIVE IODINE TX 2 YR S AGO  . Intra-abdominal abscess ([place]) 01/14/2012  . PAF (paroxysmal atrial fibrillation) ([place])   . Pneumatosis of intestines 01/14/2012  . Pneumonia 01/02/2012; ? 2009  . Radiation 08/10/13-09/15/13   57 gray to vaginal/bladder mass  . SOB (shortness of breath)    'all the time" (01/02/2012)  . Stroke ([place])   . UTI (lower urinary tract infection) 01/03/2012    Patient Active Problem List   Diagnosis Date Noted  . Acute viral  bronchitis   . Chest pain 12/28/2015  . Acute on chronic respiratory failure with hypoxia ([place]) 12/28/2015  . Acute respiratory failure ([place]) 12/28/2015  . AKI (acute kidney injury) ([place]) 12/28/2015  . Respiratory distress 12/15/2015  . Chronic respiratory failure ([place]) 12/15/2015  . PAF (paroxysmal atrial fibrillation) ([place]) 08/27/2015  . Essential hypertension 06/15/2015  . Squamous cell carcinoma, metastatic ([place]) 06/15/2015  . Acute CVA (cerebrovascular accident) ([place]) 04/13/2015  . Cerebrovascular accident (CVA) due to embolism of left middle cerebral artery ([place])   . HLD (hyperlipidemia)   . Cerebral thrombosis with cerebral infarction 04/12/2015  . Stroke-like symptoms   . Dysarthria 04/11/2015  . Absolute anemia 09/04/2014  . Elevated troponin 02/14/2014  . Dyspnea   . CAP (community acquired pneumonia) 02/13/2014  . Overweight (BMI 25.0-29.9) 02/13/2014  . Goiter 02/13/2014  . Chronic systolic CHF (congestive heart failure) ([place]) 02/13/2014  . Sepsis ([place]) 02/13/2014  . Squamous cell carcinoma of unknown origin ([place]) 07/24/2013  . S/P colostomy takedown 10/21/2012 10/23/2012  . Diverticulosis of colon 03/07/2012  . Pulmonary nodules 01/23/2012  . Hematuria, microscopic 01/14/2012  . Hyponatremia 01/14/2012  . Asymptomatic PVCs 01/03/2012  . H/O: hypothyroidism 01/03/2012  . COPD exacerbation ([place]) 01/02/2012  . Hypertension 01/02/2012  . Chronic combined systolic and diastolic congestive heart failure ([place]) 01/02/2012  . Hx of goiter 01/02/2012    Past Surgical History:  Procedure Laterality  Date  . ABDOMINAL HYSTERECTOMY  1980's  . APPENDECTOMY  1980's   "when they did hysterectomy" (01/02/2012)  . CATARACT EXTRACTION W/ INTRAOCULAR LENS  IMPLANT, BILATERAL  ?1990's   [place]  . COLON SURGERY    . COLOSTOMY N/A 03/12/2012   Procedure: COLOSTOMY;  Surgeon: Ralene Ok, MD;  Location: [place];  Service: General;  Laterality: N/A;  . COLOSTOMY REVISION N/A 03/12/2012     Procedure: COLON RESECTION SIGMOID ;  Surgeon: Ralene Ok, MD;  Location: [place];  Service: General;  Laterality: N/A;  . COLOSTOMY TAKEDOWN N/A 10/21/2012   Procedure: LAPAROSCOPIC COLOSTOMY TAKEDOWN AND HARTMANS ANASTOMOSIS, rigid proctoscopy;  Surgeon: Ralene Ok, MD;  Location: [facility];  Service: General;  Laterality: N/A;  . EYE SURGERY    . LYSIS OF ADHESION N/A 10/21/2012   Procedure: LYSIS OF ADHESION;  Surgeon: Ralene Ok, MD;  Location: [facility];  Service: General;  Laterality: N/A;  . TRANSURETHRAL RESECTION OF BLADDER TUMOR WITH GYRUS (TURBT-GYRUS) N/A 06/30/2013   Procedure: TRANSURETHRAL RESECTION OF BLADDER TUMOR WITH GYRUS (TURBT-GYRUS)/VAGINAL BIOPSY;  Surgeon: Ardis Hughs, MD;  Location: [facility];  Service: Urology;  Laterality: N/A;      Allergies Indomethacin  Family History  Problem Relation Age of Onset  . Cancer Sister     Breast  . Stroke Sister   . Heart disease Mother   . Heart disease      Social History Social History  Substance Use Topics  . Smoking status: Former Smoker    Packs/day: 0.12    Years: 35.00    Types: Cigarettes    Quit date: 01/02/1991  . Smokeless tobacco: Never Used  . Alcohol use No    Review of Systems  10-point ROS otherwise negative.  ____________________________________________   PHYSICAL EXAM:  VITAL SIGNS: ED Triage Vitals  Enc Vitals Group     BP 03/04/16 1531 142/68     Pulse Rate 03/04/16 1531 70     Resp 03/04/16 1531 25     Temp 03/04/16 1531 99.5 F (37.5 C)     Temp Source 03/04/16 1531 Oral     SpO2 03/04/16 1531 93 %     Weight 03/04/16 1820 158 lb 4.6 oz (71.8 kg)   Constitutional: Alert and oriented. Well appearing and in no acute distress. Frequent, productive coughing.  Eyes: Conjunctivae are normal.  Head: Atraumatic. Nose: No congestion/rhinnorhea. Mouth/Throat: Mucous membranes are moist.  Oropharynx non-erythematous. Neck: No stridor.  Cardiovascular: Normal rate,  regular rhythm. Good peripheral circulation. Grossly normal heart sounds.   Respiratory: Normal respiratory effort.  No retractions. Lungs with faint end-expiratory wheezing bilaterally. Rhonchi at the bases.  Gastrointestinal: Soft and nontender. No distention.  Musculoskeletal: No lower extremity tenderness nor edema. No gross deformities of extremities. Neurologic:  Normal speech and language. No gross focal neurologic deficits are appreciated.  Skin:  Skin is warm, dry and intact. No rash noted. Psychiatric: Mood and affect are normal. Speech and behavior are normal.  ____________________________________________   LABS (all labs ordered are listed, but only abnormal results are displayed)  Labs Reviewed  BASIC METABOLIC PANEL - Abnormal; Notable for the following:       Result Value   Glucose, Bld 154 (*)    Creatinine, Ser 1.11 (*)    Calcium 8.6 (*)    GFR calc non Af Amer 43 (*)    GFR calc Af Amer 50 (*)    All other components within normal limits  CBC - Abnormal;  Notable for the following:    RBC 3.20 (*)    Hemoglobin 10.5 (*)    HCT 30.4 (*)    All other components within normal limits  BRAIN NATRIURETIC PEPTIDE - Abnormal; Notable for the following:    B Natriuretic Peptide 564.1 (*)    All other components within normal limits  CULTURE, BLOOD (ROUTINE X 2)  CULTURE, BLOOD (ROUTINE X 2)  CULTURE, EXPECTORATED SPUTUM-ASSESSMENT  INFLUENZA PANEL BY PCR (TYPE A & B)  BASIC METABOLIC PANEL  CBC  I-STAT TROPOININ, ED  I-STAT CG4 LACTIC ACID, ED   ____________________________________________  EKG   EKG Interpretation  Date/Time:  Sunday March 04 2016 15:27:41 EST Ventricular Rate:  72 PR Interval:  174 QRS Duration: 132 QT Interval:  382 QTC Calculation: 418 R Axis:   50 Text Interpretation:  Normal sinus rhythm Non-specific intra-ventricular conduction block T wave abnormality, consider inferolateral ischemia Abnormal ECG No STEMI.  Confirmed by LONG MD,  JOSHUA [phone]) on 03/04/2016 6:45:33 PM       ____________________________________________  RADIOLOGY  Dg Chest 2 View  Result Date: 03/04/2016 CLINICAL DATA:  Acute onset of worsening shortness of breath and congestion. Cough. Initial encounter. EXAM: CHEST  2 VIEW COMPARISON:  Chest radiograph performed 12/30/2015 FINDINGS: The lungs are well-aerated. Vascular congestion is noted. Mildly increased interstitial markings are seen. Patchy bilateral peripheral airspace opacities raise concern for pneumonia. This is somewhat nodular in appearance at the left midlung zone. There is no evidence of pleural effusion or pneumothorax. The heart is borderline normal in size. No acute osseous abnormalities are seen. Mild degenerative change is noted along the lower thoracic and upper lumbar spine. Large calcified thyroid nodules are again seen. IMPRESSION: 1. Patchy bilateral peripheral airspace opacities raise concern for pneumonia. This is somewhat nodular in appearance at the left midlung zone. Followup PA and lateral chest X-ray is recommended in 3-4 weeks following trial of antibiotic therapy to ensure resolution and exclude underlying malignancy. 2. Vascular congestion noted. Mildly increased interstitial markings seen. Superimposed mild interstitial edema cannot be excluded. 3. Large calcified thyroid nodules again noted. Electronically Signed   By: Garald Balding M.D.   On: 03/04/2016 16:32    ____________________________________________   PROCEDURES  Procedure(s) performed:   Procedures   ____________________________________________   INITIAL IMPRESSION / ASSESSMENT AND PLAN / ED COURSE  Pertinent labs & imaging results that were available during my care of the patient were reviewed by me and considered in my medical decision making (see chart for details).  Patient presents to the emergency department for evaluation of productive cough, chills, weakness. She was admitted in December 2017  with pneumonia. She appears to have infiltrate on her chest x-ray here today. She notes generalized weakness and fatigue with severe productive cough. Plan to start HCAP coverage and give Duoneb. Will add on blood culture and discuss admission with hospitalist.   Discussed patient's case with IM teaching team.  Patient and family (if present) updated with plan. Care transferred to IM teaching service.  I reviewed all nursing notes, vitals, pertinent old records, EKGs, labs, imaging (as available).  ____________________________________________  FINAL CLINICAL IMPRESSION(S) / ED DIAGNOSES  Final diagnoses:  Community acquired pneumonia of left lung, unspecified part of lung     MEDICATIONS GIVEN DURING THIS VISIT:  Medications  ceFEPIme (MAXIPIME) 1 g in dextrose 5 % 50 mL IVPB (not administered)  mometasone-formoterol (DULERA) 200-5 MCG/ACT inhaler 2 puff (2 puffs Inhalation Given 03/04/16 2215)  bisoprolol (ZEBETA) tablet 5  mg (not administered)  albuterol (PROVENTIL) (2.5 MG/3ML) 0.083% nebulizer solution 2.5 mg (not administered)  furosemide (LASIX) tablet 40 mg (not administered)  polyethylene glycol (MIRALAX / GLYCOLAX) packet 17 g (not administered)  apixaban (ELIQUIS) tablet 5 mg (5 mg Oral Given 03/04/16 2234)  acetaminophen (TYLENOL) tablet 1,000 mg (not administered)  guaiFENesin (MUCINEX) 12 hr tablet 600 mg (600 mg Oral Given 03/04/16 2234)  ipratropium-albuterol (DUONEB) 0.5-2.5 (3) MG/3ML nebulizer solution 3 mL (3 mLs Nebulization Given 03/04/16 2215)  predniSONE (DELTASONE) tablet 40 mg (not administered)  sodium chloride 0.9 % bolus 500 mL (0 mLs Intravenous Stopped 03/04/16 2011)  ceFEPIme (MAXIPIME) 2 g in dextrose 5 % 50 mL IVPB (2 g Intravenous New Bag/Given 03/04/16 2001)  ipratropium-albuterol (DUONEB) 0.5-2.5 (3) MG/3ML nebulizer solution 3 mL (3 mLs Nebulization Given 03/04/16 1907)     NEW OUTPATIENT MEDICATIONS STARTED DURING THIS VISIT:  None   Note:  This  document was prepared using Dragon voice recognition software and may include unintentional dictation errors.  Nanda Quinton, MD Emergency Medicine   Margette Fast, MD 03/04/16 2249

## 2016-03-04 NOTE — ED Notes (Signed)
Patient is stable and ready to be transport to the floor at this time.  Report was called to 6E RN.  Belongings taken with the patient to the floor.   

## 2016-03-04 NOTE — Progress Notes (Signed)
Pharmacy Antibiotic Note  [name] is a 83 y.o. female admitted on 03/04/2016 with pneumonia.    Plan: Cefepime 2 g x 1 then 1 g q24h Vancomycin 1 g q24h  Weight: 158 lb 4.6 oz (71.8 kg)  Temp (24hrs), Avg:99.5 F (37.5 C), Min:99.5 F (37.5 C), Max:99.5 F (37.5 C)   Recent Labs Lab 03/04/16 1550 03/04/16 1614  WBC 9.5  --   CREATININE 1.11*  --   LATICACIDVEN  --  1.28    Estimated Creatinine Clearance: 31 mL/min (by C-G formula based on SCr of 1.11 mg/dL (H)).    Allergies  Allergen Reactions  . Indomethacin Anaphylaxis and Other (See Comments)    "took it fine for awhile; one day I stopped breathing and I ended up in the hospital" (01/02/2012) Pt has tolerated aspirin    Levester Fresh, PharmD, BCPS, BCCCP Clinical Pharmacist 03/04/2016 6:35 PM

## 2016-03-04 NOTE — H&P (Signed)
Date: 03/04/2016               Patient Name:  [name] MRN: [id number]  DOB: [birth year] Age / Sex: 83 y.o., female   PCP: Leonard Downing, MD         Medical Service: Internal Medicine Teaching Service         Attending Physician: Dr. Lucious Groves, DO    First Contact: Dr. Danford Bad Pager: [phone]  Second Contact: Dr. Benjamine Mola Pager: [phone]       After Hours (After 5p/  First Contact Pager: [phone]  weekends / holidays): Second Contact Pager: [phone]   Chief Complaint: cough and shortness of breath  History of Present Illness: [name] is a 83 y.o. female with PMH of COPD (2L home O2), CHF, HTN, and admission in 12/2015 for PNA,Squamous cell carcinoma of unknown region presented to ED with complaint of worsening cough with yellowish sputum and shortness of breath for the past 3 days.  Patient reports for the last 3 day patient admits to increased shortness of breath, congestion and productive cough. Her cough is productive of yellow sputum. Compared to her baseline, she does admit to increased dsypnea, cough and productive cough. Patient also admits to generalized body aches. She denies associated sick contacts. She also felt warm at home, but had no recorded fevers at home. She admits to abdominal pain after eating. She denies associated nausea and vomiting. She reports she has not been eating much over the past 3 days. She denies diarrhea or constipation. She denies dysuria, headache. She has not tried any new medications for her symptoms outside her regular medications.   Patient thinks that bisoprolol makes her lower extremities swell, but denies any worsening lower extremity swelling.  Meds:  Current Meds  Medication Sig  . acetaminophen (TYLENOL) 500 MG tablet Take 1,000 mg by mouth every 6 (six) hours as needed for mild pain. Reported on 07/14/2015  . albuterol (PROVENTIL HFA;VENTOLIN HFA) 108 (90 BASE) MCG/ACT inhaler Inhale 2 puffs into the lungs every 6 (six) hours  as needed for wheezing.  Marland Kitchen albuterol (PROVENTIL) (2.5 MG/3ML) 0.083% nebulizer solution Take 3 mLs (2.5 mg total) by nebulization every 6 (six) hours as needed for wheezing or shortness of breath.  Marland Kitchen apixaban (ELIQUIS) 5 MG TABS tablet Take 1 tablet (5 mg total) by mouth 2 (two) times daily.  . benzonatate (TESSALON) 200 MG capsule Take 1 capsule (200 mg total) by mouth 3 (three) times daily. 3 times daily as scheduled for 3-4 days,then 3 times daily as needed for cough  . bisoprolol (ZEBETA) 5 MG tablet Take 1 tablet (5 mg total) by mouth daily.  . cholecalciferol (VITAMIN D) 1000 units tablet Take 1,000 Units by mouth daily.  . diphenhydramine-acetaminophen (TYLENOL PM) 25-500 MG TABS tablet Take 1 tablet by mouth at bedtime.  . ferrous sulfate 325 (65 FE) MG tablet Take 325 mg by mouth daily as needed (iron supplement).  . furosemide (LASIX) 40 MG tablet Take 1 tablet (40 mg total) by mouth daily.  . polyethylene glycol (MIRALAX / GLYCOLAX) packet Take 17 g by mouth daily.  . polyvinyl alcohol (LIQUIFILM TEARS) 1.4 % ophthalmic solution Place 1 drop into both eyes as needed for dry eyes.  . SYMBICORT 160-4.5 MCG/ACT inhaler Inhale 2 puffs into the lungs 2 (two) times daily.     Allergies: Allergies as of 03/04/2016 - Review Complete 03/04/2016  Allergen Reaction Noted  . Indomethacin Anaphylaxis and  Other (See Comments) 01/29/2011   Past Medical History:  Diagnosis Date  . Anemia    years ago  . Arthritis    "right shoulder" (01/02/2012)  . Bladder mass   . CAP (community acquired pneumonia) 01/02/2012   Lower lobe   . CHF (congestive heart failure) ([place])   . COPD (chronic obstructive pulmonary disease) ([place])   . Difficulty sleeping   . Diverticulitis of colon with perforation 01/14/2012   That is post sigmoid resection with [facility] pouch and end colostomy   . GERD (gastroesophageal reflux disease)    occasional  . Goiter    radioactive iodine ablation/notes 01/02/2012  . History  of kidney stones    X 1  . Hypertension   . Hypothyroidism    "I have taken Synthroid before" (01/02/2012)   HAS HAD RADIOACTIVE IODINE TX 2 YR S AGO  . Intra-abdominal abscess ([place]) 01/14/2012  . PAF (paroxysmal atrial fibrillation) ([place])   . Pneumatosis of intestines 01/14/2012  . Pneumonia 01/02/2012; ? 2009  . Radiation 08/10/13-09/15/13   57 gray to vaginal/bladder mass  . SOB (shortness of breath)    'all the time" (01/02/2012)  . Stroke ([place])   . UTI (lower urinary tract infection) 01/03/2012    Family History:  Son-HTN Grandson- HTN Daughter- HTN  Social History:  Previous smoker- quit 40 years ago Denies alcohol use Denies illicit drug use  Review of Systems: A complete ROS was negative except as per HPI.   Physical Exam: Blood pressure (!) 145/83, pulse 68, temperature 98.5 F (36.9 C), temperature source Oral, resp. rate 19, height 5' (1.524 m), weight 157 lb 13.6 oz (71.6 kg), SpO2 96 %. Vitals:   03/04/16 1900 03/04/16 2000 03/04/16 2035 03/04/16 2218  BP: 146/65 133/63 (!) 145/83   Pulse: 63 63 68   Resp: 20 20 19    Temp:   98.5 F (36.9 C)   TempSrc:   Oral   SpO2: 99% 96% 98% 96%  Weight:   157 lb 13.6 oz (71.6 kg)   Height:   5' (1.524 m)    General: Vital signs reviewed.  Patient is well-developed and well-nourished, in no acute distress and cooperative with exam.  Head: Normocephalic and atraumatic. Eyes: EOMI, conjunctivae normal, no scleral icterus.  Neck: Supple, trachea midline, normal ROM, no JVD, masses, thyromegaly, or carotid bruit present.  Cardiovascular: RRR, S1 normal, S2 normal, no murmurs, gallops, or rubs. Pulmonary/Chest: Clear to auscultation bilaterally, no wheezes, rales, or rhonchi. Abdominal: Soft, non-tender, non-distended, BS +, no masses, organomegaly, or guarding present.  Musculoskeletal: No joint deformities, erythema, or stiffness, ROM full and nontender. Extremities: No lower extremity edema bilaterally,  pulses symmetric and  intact bilaterally. No cyanosis or clubbing. Neurological: A&O x3, Strength is normal and symmetric bilaterally, cranial nerve II-XII are grossly intact, no focal motor deficit, sensory intact to light touch bilaterally.  Skin: Warm, dry and intact. No rashes or erythema. Psychiatric: Normal mood and affect. speech and behavior is normal. Cognition and memory are normal.  Labs. CBC Latest Ref Rng & Units 03/04/2016 12/31/2015 12/30/2015  WBC 4.0 - 10.5 K/uL 9.5 7.7 9.3  Hemoglobin 12.0 - 15.0 g/dL 10.5(L) 8.9(L) 9.1(L)  Hematocrit 36.0 - 46.0 % 30.4(L) 24.9(L) 25.7(L)  Platelets 150 - 400 K/uL 242 224 209   BMP Latest Ref Rng & Units 03/04/2016 12/30/2015 12/28/2015  Glucose 65 - 99 mg/dL 154(H) 130(H) 124(H)  BUN 6 - 20 mg/dL 16 12 20   Creatinine 0.44 - 1.00 mg/dL  1.11(H) 0.85 1.12(H)  Sodium 135 - 145 mmol/L 137 135 137  Potassium 3.5 - 5.1 mmol/L 3.5 4.5 3.9  Chloride 101 - 111 mmol/L 101 102 100(L)  CO2 22 - 32 mmol/L 27 27 26   Calcium 8.9 - 10.3 mg/dL 8.6(L) 8.2(L) 8.5(L)   Influenza PCR. Negative. BNP.564.1 Lactic acid.1.28  EKG: Sinus rhythm,T wave inversion in II, III, AVF, V5 and V6, little less pronounced then before. No acute change. QTc 418.  CXR: FINDINGS: The lungs are well-aerated. Vascular congestion is noted. Mildly increased interstitial markings are seen. Patchy bilateral peripheral airspace opacities raise concern for pneumonia. This is somewhat nodular in appearance at the left midlung zone. There is no evidence of pleural effusion or pneumothorax.  The heart is borderline normal in size. No acute osseous abnormalities are seen. Mild degenerative change is noted along the lower thoracic and upper lumbar spine. Large calcified thyroid nodules are again seen.  IMPRESSION: 1. Patchy bilateral peripheral airspace opacities raise concern for pneumonia. This is somewhat nodular in appearance at the left midlung zone. Followup PA and lateral chest X-ray is  recommended in 3-4 weeks following trial of antibiotic therapy to ensure resolution and exclude underlying malignancy. 2. Vascular congestion noted. Mildly increased interstitial markings seen. Superimposed mild interstitial edema cannot be excluded. 3. Large calcified thyroid nodules again noted.    Assessment & Plan by Problem: Principal Problem:   CAP (community acquired pneumonia) Active Problems:   COPD exacerbation ([place])   Chronic combined systolic and diastolic congestive heart failure ([place])   S/P colostomy takedown 10/21/2012   Overweight (BMI 25.0-29.9)   Essential hypertension   PAF (paroxysmal atrial fibrillation) (HCC)   History of squamous cell carcinoma  Mrs. Sammet is in 83 year old female with past medical history of chronic combined heart failure, COPD, hypertension, paroxysmal atrial fibrillation who presents to the emergency department with 3 day history of cough and shortness of breath and found to have pneumonia and a COPD exacerbation.  COPD Exacerbation: Possibly secondary to viral versus bacterial pneumonia. Patient has 3 out of 3 cardinal symptoms with increased dyspnea, cough and productive cough of phlegm. She is on 2 L chronically at home. Here she is afebrile, satting well on 3 L nasal cannula. At home, patient is on albuterol when necessary and Symbicort twice a day. Lungs are overall clear on examination. No pulmonary function testing on file. -Cefepime given risk factors for pseudomonas given frequent antibiotic administration -DuoNeb every 6 hours -Prednisone 40 mg daily for 5 days -Albuterol when necessary -Dulera twice a day -Mucinex 600 mg twice a day -Consider outpatient pulmonary function testing  Possible Community Acquired Pneumonia: Patient presenting with cough and subjective fever with body aches. Chest x-ray showed patchy bilateral peripheral airspace opacities concerning for pneumonia with a nodular finding in the left midlung.  Recommendations were to repeat a chest x-ray in 3-4 weeks after antibiotic therapy. We're currently treating patient with cefepime for COPD exacerbation with risk factors for pseudomonas. -Influenza PCR negative. -Continue cefepime -Repeat chest x-ray in 3-4 weeks to reevaluate nodular finding in left midlung  Chronic combined systolic heart failure: Patient has ejection fraction of 40-55% on recent echocardiogram in December 2017. She denies orthopnea, weight gain, lower extremity edema. She appears euvolemic on exam, despite bnp of 564.1. Chest x-ray did show possible vascular congestion. Patient is on Lasix 40 mg daily at home and bisoprolol 5 mg daily. -Continue Lasix 40 mg daily -Continue bisoprolol 5 mg daily  Paroxysmal atrial fibrillation: Patient has  a history of paroxysmal atrial fibrillation. She is on Eliquis 5 mg twice a day at home. Her rate is controlled with bisoprolol 5 mg daily. -Continue Eliquis 5 mg twice a day -Continue bisoprolol 5 mg daily  History of Squamous cell carcinoma of unknown origin: Patient has a history of squamous cell carcinoma of unknown origin which was found in her bladder and upper vaginal area. Patient is status post radiation in 2015. On her recent visit to her oncologist, there is no findings of recurrence.   Multi nodular goiter: Patient has a history of multinodular goiter. On chest x-ray this admission there are large calcified thyroid nodules. Patient had a thyroid ultrasound in April 2017 which showed multiple heterogeneous nodules with two large foci of calcifications. Prior PET-CT demonstrated a suspicious 5 cm hypermetabolic nodule involving the superior right thyroid lobe. If thyroid nodule workup is desired, ultrasound-guided aspiration of the large nodule involving the superior right thyroid lobe is recommended. It is unclear to me at this was followed up on. -Recommend follow-up as outpatient  DVT/PE prophylaxis: Eliquis twice a day Code: DO  NOT RESUSCITATE FEN: Heart healthy  Dispo: Admit patient to Inpatient with expected length of stay greater than 2 midnights.  Signed: Lorella Nimrod, MD 03/04/2016, 11:09 PM  Pager: [phone]

## 2016-03-05 DIAGNOSIS — J189 Pneumonia, unspecified organism: Secondary | ICD-10-CM

## 2016-03-05 DIAGNOSIS — Z923 Personal history of irradiation: Secondary | ICD-10-CM

## 2016-03-05 DIAGNOSIS — Z8249 Family history of ischemic heart disease and other diseases of the circulatory system: Secondary | ICD-10-CM

## 2016-03-05 DIAGNOSIS — I480 Paroxysmal atrial fibrillation: Secondary | ICD-10-CM

## 2016-03-05 DIAGNOSIS — Z79899 Other long term (current) drug therapy: Secondary | ICD-10-CM | POA: Diagnosis not present

## 2016-03-05 DIAGNOSIS — Z87891 Personal history of nicotine dependence: Secondary | ICD-10-CM

## 2016-03-05 DIAGNOSIS — J441 Chronic obstructive pulmonary disease with (acute) exacerbation: Secondary | ICD-10-CM

## 2016-03-05 DIAGNOSIS — Z7901 Long term (current) use of anticoagulants: Secondary | ICD-10-CM | POA: Diagnosis not present

## 2016-03-05 DIAGNOSIS — J9610 Chronic respiratory failure, unspecified whether with hypoxia or hypercapnia: Secondary | ICD-10-CM

## 2016-03-05 DIAGNOSIS — Z85828 Personal history of other malignant neoplasm of skin: Secondary | ICD-10-CM

## 2016-03-05 DIAGNOSIS — Z66 Do not resuscitate: Secondary | ICD-10-CM

## 2016-03-05 DIAGNOSIS — E042 Nontoxic multinodular goiter: Secondary | ICD-10-CM

## 2016-03-05 DIAGNOSIS — Z9981 Dependence on supplemental oxygen: Secondary | ICD-10-CM

## 2016-03-05 DIAGNOSIS — Z886 Allergy status to analgesic agent status: Secondary | ICD-10-CM

## 2016-03-05 DIAGNOSIS — I5042 Chronic combined systolic (congestive) and diastolic (congestive) heart failure: Secondary | ICD-10-CM | POA: Diagnosis not present

## 2016-03-05 LAB — BASIC METABOLIC PANEL
ANION GAP: 5 (ref 5–15)
BUN: 15 mg/dL (ref 6–20)
CO2: 26 mmol/L (ref 22–32)
Calcium: 8.3 mg/dL — ABNORMAL LOW (ref 8.9–10.3)
Chloride: 106 mmol/L (ref 101–111)
Creatinine, Ser: 0.94 mg/dL (ref 0.44–1.00)
GFR, EST NON AFRICAN AMERICAN: 53 mL/min — AB (ref >60)
GLUCOSE: 106 mg/dL — AB (ref 65–99)
Potassium: 3.2 mmol/L — ABNORMAL LOW (ref 3.5–5.1)
Sodium: 137 mmol/L (ref 135–145)

## 2016-03-05 LAB — CBC
HCT: 27 % — ABNORMAL LOW (ref 36.0–46.0)
HEMOGLOBIN: 9.6 g/dL — AB (ref 12.0–15.0)
MCH: 33.4 pg (ref 26.0–34.0)
MCHC: 35.6 g/dL (ref 30.0–36.0)
MCV: 94.1 fL (ref 78.0–100.0)
PLATELETS: 191 10*3/uL (ref 150–400)
RBC: 2.87 MIL/uL — AB (ref 3.87–5.11)
RDW: 13.2 % (ref 11.5–15.5)
WBC: 11.7 10*3/uL — ABNORMAL HIGH (ref 4.0–10.5)

## 2016-03-05 LAB — PROCALCITONIN

## 2016-03-05 MED ORDER — IPRATROPIUM-ALBUTEROL 0.5-2.5 (3) MG/3ML IN SOLN
3.0000 mL | Freq: Two times a day (BID) | RESPIRATORY_TRACT | Status: DC
  Administered 2016-03-05 – 2016-03-06 (×2): 3 mL via RESPIRATORY_TRACT
  Filled 2016-03-05 (×2): qty 3

## 2016-03-05 MED ORDER — POTASSIUM CHLORIDE CRYS ER 20 MEQ PO TBCR
40.0000 meq | EXTENDED_RELEASE_TABLET | Freq: Two times a day (BID) | ORAL | Status: AC
  Administered 2016-03-05 (×2): 40 meq via ORAL
  Filled 2016-03-05 (×2): qty 2

## 2016-03-05 MED ORDER — DIPHENHYDRAMINE HCL 25 MG PO CAPS
25.0000 mg | ORAL_CAPSULE | Freq: Every evening | ORAL | Status: DC | PRN
  Administered 2016-03-05: 25 mg via ORAL
  Filled 2016-03-05: qty 1

## 2016-03-05 MED ORDER — ORAL CARE MOUTH RINSE
15.0000 mL | Freq: Two times a day (BID) | OROMUCOSAL | Status: DC
  Administered 2016-03-05 – 2016-03-06 (×3): 15 mL via OROMUCOSAL

## 2016-03-05 MED ORDER — AZITHROMYCIN 500 MG IV SOLR
500.0000 mg | Freq: Once | INTRAVENOUS | Status: AC
  Administered 2016-03-05: 500 mg via INTRAVENOUS
  Filled 2016-03-05: qty 500

## 2016-03-05 MED ORDER — DIPHENHYDRAMINE-APAP (SLEEP) 25-500 MG PO TABS
1.0000 | ORAL_TABLET | Freq: Every evening | ORAL | Status: DC | PRN
Start: 2016-03-05 — End: 2016-03-05

## 2016-03-05 NOTE — Progress Notes (Signed)
   Subjective: [name] was seen and evaluated today at bedside. Reports she feels better from admission however doesn't feel like shes back at baseline. Reports she has been unable to ambulate around her room .  Objective:  Vital signs in last 24 hours: Vitals:   03/05/16 0247 03/05/16 0630 03/05/16 0733 03/05/16 1110  BP:  135/76  (!) 118/46  Pulse:  88  (!) 58  Resp:  20  20  Temp:  98.6 F (37 C)  99.1 F (37.3 C)  TempSrc:  Oral  Oral  SpO2: 97% 98% 94% 99%  Weight:      Height:       General: In no acute distress. Resting in bedside recliner.  HENT: EOMI. No icterus or ptosis. Cardiovascular: Regular rate and rhythm. No murmur or rub appreciated. Pulmonary: Fine bibasilar crackles. No wheezes.  Abdomen: Soft, non-tender and non-distended. +bowel sounds.  Extremities: No peripheral edema noted BL. No gross deformities. Skin: Warm, dry. No cyanosis.  Psych: Mood normal and affect was mood congruent. Responds to questions appropriately.   Assessment/Plan:  Principal Problem:   COPD exacerbation (H[facility]) Active Problems:   Chronic combined systolic and diastolic congestive heart failure ([place])   S/P colostomy takedown 10/21/2012   CAP (community acquired pneumonia)   Overweight (BMI 25.0-29.9)   Essential hypertension   PAF (paroxysmal atrial fibrillation) (H[facility])   Chronic respiratory failure, unsp w hypoxia or hypercapnia (H[facility])   History of squamous cell carcinoma  Acute on Chronic Respiratory Failure,  Unclear if with hypoxia or hypercapnia, COPD Exacerbation: Dyspnea improved however not yet at baseline. She is saturating well on her home O2 however only when she is not exerting herself. Pct 0.10 and suspect this is COPD exacerbation not CAP. Will change antibiotic therapy to Azithromycin. Do not see any clear documentation in the chart indicating why patient is on chronic oxygen therapy. Reports she has been on home oxygen for about 3 years.  Some notes from  pulmonology in 2007 indicate she had mild obstructive disease however suspect patients baseline respiratory status has declined over the past several years as she now requires oxygen. Would like to try to wean patient off oxygen in controlled environment and in addition ensure she oxygenates well. In addition, she may benefit from [place].  -Pct <10. Cefepime --> Azithromycin -Prednisone 40 mg  -Scheduled duonebs, dulera and mucinex -Albuterol PRN -Will require pulmonology evaluation + repeat of PFTs as an outpatient   Chronic combined systolic and diastolic congestive heart failure: Euvolemic on exam today although BNP is elevated. Will continue Lasix and Bisoprolol at this time and will monitor fluid status closely.   PAF: was not in afib during examination. Currently anticoagulated with Eliquis 5mg  BID (appropriate with a CHADSVASC 7) and rate controlled with Bisoprolol.   Dispo: Anticipated discharge tomorrow.   Jacqueline Dercole, DO 03/05/2016, 1:18 PM Pager: [phone]

## 2016-03-05 NOTE — H&P (Signed)
Internal Medicine Attending Admission Note  I saw and evaluated the patient. I reviewed the resident's note and I agree with the resident's findings and plan as documented in the resident's note.  Assessment & Plan by Problem:   COPD exacerbation ([place]) - I suspect her increased dyspnea and productive cough is due to a COPD exacerbation.  I am not sure that this is clearly a bacterial pneumonia but may be viral.  She was treated for a similar presentation back in December for both a COPD and bacterial PNA.  She was started on Cefepime but I do not feels that she clinically appears ill enough or has enough risk factors to warrant such broad coverage.  I would favor checking a Procalcitonin, if low she could be treated with steroids and azithromycin for COPD exacerbation.  If elevated I would treat with steroids and levaquin for additional antibacterial coverage. - Of note her pulmologist was concerned for possible vocal cord dysfunction as a cause of her symptoms. I cannot find the actual PFT results to see if they had flow volume loops. - She may benefit from addition of a LAMA  Chronic respiratory failure unspecified if hypoxic or hypercapnic - Patient is currently on her home oxygen of 2L.  I do not have a good since of the reason she was placed on home oxygen which she reports that she has been on for the last 3 years.  Her Last PFTS of record show only mild reduction of the FEV1.  She does have a history of CHF which could be contributing and her previous pulmonolgist felt she may have some vocal cord dysfunction.  She would likely benefit from follow up with a pulmonogist and certainly repeat of her PFTs.    Chronic combined systolic and diastolic congestive heart failure ([place]) - Appears grossly euvolemic on exam, BNP is elevated. - Continue to monitor, continue lasix and bisoprolol    PAF (paroxysmal atrial fibrillation) (HCC) - Currently in NSR - Rate control with bisoprolol - A/C with  eliquis 5mg  BID (CHADSVASC 7)   Chief Complaint(s): SOB  History - key components related to admission: Briefly [name] is an 83 year old female with a past medical history of chronic respiratory failure on 2 L of home oxygen, COPD, CHF, hypertension, A. fib, squamous cell cancer who presented to the ED with complaints of cough and shortness of breath for the last 3 days. She notes that her cough is productive of yellow sputum. She notes associated symptoms of generalized body aches, has not had any fevers nausea or vomiting. She denies any specific sick contacts although recently visited a friend in an assisted living facility.  For her chronic respiratory failure she reports to me that she has been on home oxygen for about the last 3 years. She thinks it is prescribed by her PCP.  In her chart there are a few pulmonology notes dating back to 2007 2008 which noted mild to moderate obstructive lung disease. At that time Dr. Melvyn Novas felt that she did not have asthma, she was able to walk 500 feet without desaturations and he actually stopped her Symbicort. He was concerned about undiagnosed gastritis and possible vocal cord dysfunction and recommended a PPI. This whole reports frustration with that diagnosis she reports that she never took the PPI. She does not remember who started her back on Symbicort for how long she's been taking. Review of her medical record also shows a similar presentation 12/28/2015 where she was weakly diagnosed  with sepsis and treated for a COPD exacerbation and bacterial pneumonia. Back in August she was also treated for a community-acquired pneumonia. It appears she had multiple x-rays after that that showed improvement.  Lab results: Reviewed in Epic  Physical Exam - key components related to admission: General: resting in bed no distress HEENT: EOMI, no scleral icterus Cardiac: RRR, no rubs, murmurs or gallops Pulm: fine crackles at bases, no wheezing Abd: soft,  nontender, nondistended, BS present Ext: warm and well perfused, no pedal edema Neuro: alert and oriented  Vitals:   03/04/16 2218 03/05/16 0247 03/05/16 0630 03/05/16 0733  BP:   135/76   Pulse:   88   Resp:   20   Temp:   98.6 F (37 C)   TempSrc:   Oral   SpO2: 96% 97% 98% 94%  Weight:      Height:

## 2016-03-06 DIAGNOSIS — Z7901 Long term (current) use of anticoagulants: Secondary | ICD-10-CM | POA: Diagnosis not present

## 2016-03-06 DIAGNOSIS — I5042 Chronic combined systolic (congestive) and diastolic (congestive) heart failure: Secondary | ICD-10-CM | POA: Diagnosis not present

## 2016-03-06 DIAGNOSIS — J189 Pneumonia, unspecified organism: Secondary | ICD-10-CM | POA: Diagnosis not present

## 2016-03-06 DIAGNOSIS — Z9981 Dependence on supplemental oxygen: Secondary | ICD-10-CM | POA: Diagnosis not present

## 2016-03-06 DIAGNOSIS — J441 Chronic obstructive pulmonary disease with (acute) exacerbation: Secondary | ICD-10-CM | POA: Diagnosis not present

## 2016-03-06 DIAGNOSIS — E042 Nontoxic multinodular goiter: Secondary | ICD-10-CM | POA: Diagnosis not present

## 2016-03-06 DIAGNOSIS — J9610 Chronic respiratory failure, unspecified whether with hypoxia or hypercapnia: Secondary | ICD-10-CM | POA: Diagnosis not present

## 2016-03-06 DIAGNOSIS — Z923 Personal history of irradiation: Secondary | ICD-10-CM | POA: Diagnosis not present

## 2016-03-06 DIAGNOSIS — I110 Hypertensive heart disease with heart failure: Secondary | ICD-10-CM | POA: Diagnosis not present

## 2016-03-06 DIAGNOSIS — Z79899 Other long term (current) drug therapy: Secondary | ICD-10-CM | POA: Diagnosis not present

## 2016-03-06 DIAGNOSIS — I480 Paroxysmal atrial fibrillation: Secondary | ICD-10-CM | POA: Diagnosis not present

## 2016-03-06 DIAGNOSIS — Z85828 Personal history of other malignant neoplasm of skin: Secondary | ICD-10-CM | POA: Diagnosis not present

## 2016-03-06 LAB — BASIC METABOLIC PANEL
ANION GAP: 7 (ref 5–15)
BUN: 18 mg/dL (ref 6–20)
CALCIUM: 9 mg/dL (ref 8.9–10.3)
CO2: 26 mmol/L (ref 22–32)
Chloride: 104 mmol/L (ref 101–111)
Creatinine, Ser: 1.01 mg/dL — ABNORMAL HIGH (ref 0.44–1.00)
GFR, EST AFRICAN AMERICAN: 56 mL/min — AB (ref >60)
GFR, EST NON AFRICAN AMERICAN: 48 mL/min — AB (ref >60)
GLUCOSE: 115 mg/dL — AB (ref 65–99)
POTASSIUM: 4.3 mmol/L (ref 3.5–5.1)
SODIUM: 137 mmol/L (ref 135–145)

## 2016-03-06 LAB — CBC
HEMATOCRIT: 28.1 % — AB (ref 36.0–46.0)
HEMOGLOBIN: 9.9 g/dL — AB (ref 12.0–15.0)
MCH: 33.1 pg (ref 26.0–34.0)
MCHC: 35.2 g/dL (ref 30.0–36.0)
MCV: 94 fL (ref 78.0–100.0)
Platelets: 255 10*3/uL (ref 150–400)
RBC: 2.99 MIL/uL — ABNORMAL LOW (ref 3.87–5.11)
RDW: 12.9 % (ref 11.5–15.5)
WBC: 9.4 10*3/uL (ref 4.0–10.5)

## 2016-03-06 MED ORDER — PREDNISONE 20 MG PO TABS: 40.0000 mg | ORAL_TABLET | Freq: Every day | ORAL | 0 refills | Status: DC

## 2016-03-06 MED ORDER — AZITHROMYCIN 500 MG PO TABS
250.0000 mg | ORAL_TABLET | Freq: Once | ORAL | Status: AC
  Administered 2016-03-06: 250 mg via ORAL
  Filled 2016-03-06: qty 1

## 2016-03-06 MED ORDER — AZITHROMYCIN 250 MG PO TABS: 250.0000 mg | ORAL_TABLET | Freq: Every day | ORAL | 0 refills | Status: AC

## 2016-03-06 MED ORDER — POTASSIUM CHLORIDE ER 10 MEQ PO TBCR: 10.0000 meq | EXTENDED_RELEASE_TABLET | Freq: Every day | ORAL | 0 refills | Status: DC

## 2016-03-06 NOTE — Discharge Summary (Signed)
Name: [name] MRN: [id number] DOB: [birth year] 83 y.o. PCP: Jacqueline Downing, MD  Date of Admission: 03/04/2016  5:53 PM Date of Discharge: 03/06/2016 Attending Physician: Dr. 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   CAP (community acquired pneumonia)   Overweight (BMI 25.0-29.9)   Essential hypertension   PAF (paroxysmal atrial fibrillation) (HCC)   Chronic respiratory failure, unsp w hypoxia or hypercapnia (HCC)   History of squamous cell carcinoma   Discharge Medications: Allergies as of 03/06/2016      Reactions   Indomethacin Anaphylaxis, Other (See Comments)   "took it fine for awhile; one day I stopped breathing and I ended up in the hospital" (01/02/2012) Pt has tolerated aspirin      Medication List    TAKE these medications   acetaminophen 500 MG tablet Commonly known as:  TYLENOL Take 1,000 mg by mouth every 6 (six) hours as needed for mild pain. Reported on 07/14/2015   albuterol (2.5 MG/3ML) 0.083% nebulizer solution Commonly known as:  PROVENTIL Take 3 mLs (2.5 mg total) by nebulization every 6 (six) hours as needed for wheezing or shortness of breath.   albuterol 108 (90 Base) MCG/ACT inhaler Commonly known as:  PROVENTIL HFA;VENTOLIN HFA Inhale 2 puffs into the lungs every 6 (six) hours as needed for wheezing.   apixaban 5 MG Tabs tablet Commonly known as:  ELIQUIS Take 1 tablet (5 mg total) by mouth 2 (two) times daily.   benzonatate 200 MG capsule Commonly known as:  TESSALON Take 1 capsule (200 mg total) by mouth 3 (three) times daily. 3 times daily as scheduled for 3-4 days,then 3 times daily as needed for cough   bisoprolol 5 MG tablet Commonly known as:  ZEBETA Take 1 tablet (5 mg total) by mouth daily.   cholecalciferol 1000 units tablet Commonly known as:  VITAMIN  D Take 1,000 Units by mouth daily.   diphenhydramine-acetaminophen 25-500 MG Tabs tablet Commonly known as:  TYLENOL PM Take 1 tablet by mouth at bedtime.   ferrous sulfate 325 (65 FE) MG tablet Take 325 mg by mouth daily as needed (iron supplement).   furosemide 40 MG tablet Commonly known as:  LASIX Take 1 tablet (40 mg total) by mouth daily.   polyethylene glycol packet Commonly known as:  MIRALAX / GLYCOLAX Take 17 g by mouth daily.   polyvinyl alcohol 1.4 % ophthalmic solution Commonly known as:  LIQUIFILM TEARS Place 1 drop into both eyes as needed for dry eyes.   potassium chloride 10 MEQ tablet Commonly known as:  K-DUR Take 1 tablet (10 mEq total) by mouth daily.   predniSONE 20 MG tablet Commonly known as:  DELTASONE Take 2 tablets (40 mg total) by mouth daily with breakfast.   SYMBICORT 160-4.5 MCG/ACT inhaler Generic drug:  budesonide-formoterol Inhale 2 puffs into the lungs 2 (two) times daily.     ASK your doctor about these medications   azithromycin 250 MG tablet Commonly known as:  ZITHROMAX Take 1 tablet (250 mg total) by mouth daily. Ask about: Should I take this medication?       Disposition and follow-up:   [name] was discharged from [facility] in Good condition.  At the hospital follow up visit please address:  1.  COPD with exacerbation, previously requiring supplemental oxygen at home: Please assess  for improvement of breathing and consider ambulatory pulse oximetry as she was not requiring contiunous oxygen prior to discharge.  2.  Labs / imaging needed at time of follow-up: -She will need repeat PA an lateral chest xrays in about 3-4 weeks for follow up of questionable infiltrate or possible nodule in left lung airspace. -Basic metabolic panel should be checked as she had evidence of vascular congestion responding to loop diuretics - discharged on 40mg  lasix PO daily. -Repeat pulmonary function testing would be  beneficial as she has had only minimal reduction on past studies but disproportionately high rate of admissions and home oxygen dependence  3. Multinodular goiter: Incidental finding without evidence of contribution to this admission  Follow-up Appointments: Follow-up Information    Jacqueline Downing, MD. Schedule an appointment as soon as possible for a visit.   Specialty:  Family Medicine Why:  Please call Dr. Arelia Sneddon' office to see him for a follow up appointment within 1-2 weeks. We will also send over a summary of your hospital stay. 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 was admitted for COPD exacerbation characterized with increased cough and sputum production and higher work of breathing. CXR was concerning for possible left lung infiltrate. Low procalcitonin and rapid improvement to steroids along with her systemic symptoms and body aches was more consistent with a viral infection as a trigger for this exacerbation. Antibiotic coverage for community acquired pneumonia was discontinued. On hospital day 1 she was ambulatory without oxygen desaturation on room air and discharged to complete an additional 3 days of prednisone and azithromycin.  Heart failure with reduced ejection fraction: She had evidence of mild pulmonary vascular congestion and BNP was elevated but symptoms improved without aggressive diuresis.  Paroxysmal atrial fibrillation: She  remained in normal sinus rhythm during hospitalization and home anticoagulation on eliquis 5mg  BID was continued.  Discharge Vitals:   BP (!) 125/48 (BP Location: Right Arm)   Pulse (!) 59   Temp 97.9 F (36.6 C) (Oral)   Resp 18   Ht 5' (1.524 m)   Wt 159 lb 6.3 oz (72.3 kg)   SpO2 100%   BMI 31.13 kg/m   Pertinent Labs, Studies, and Procedures: 03/04/16 CHEST  2 VIEW  IMPRESSION: 1. Patchy bilateral peripheral airspace opacities raise concern for pneumonia. This is somewhat nodular in appearance at the left midlung zone. Followup PA and lateral chest X-ray is recommended in 3-4 weeks following trial of antibiotic therapy to ensure resolution and exclude underlying malignancy. 2. Vascular congestion noted. Mildly increased interstitial markings seen. Superimposed mild interstitial edema cannot be excluded. 3. Large calcified thyroid nodules again noted.  Discharge Instructions: Discharge Instructions    Diet - low sodium heart healthy    Complete by:  As directed    Discharge instructions    Complete by:  As directed    You are being discharged with 3 additional days of medication to continue treating your episode of shortness of breath. You should  take the azithromycin (1 pill) and prednisone (2 pills) once daily each for 3 more days starting Wednesday. If you have any trouble getting these medications at the pharmacy please call back immediately to the hospital or to our clinic at [phone]    Chronic Obstructive Pulmonary Disease Exacerbation  Chronic obstructive pulmonary disease (COPD) is a common lung condition in which airflow from the lungs is limited. COPD is a general term that can be used to describe many different lung problems that limit airflow, including chronic bronchitis and emphysema. COPD exacerbations are episodes when breathing symptoms become much worse and require extra treatment. Without treatment, COPD exacerbations can be life threatening, and  frequent COPD exacerbations can cause further damage to your lungs. What are the causes?  Respiratory infections.  Exposure to smoke.  Exposure to air pollution, chemical fumes, or dust. Sometimes there is no apparent cause or trigger. What increases the risk?  Smoking cigarettes.  Older age.  Frequent prior COPD exacerbations. What are the signs or symptoms?  Increased coughing.  Increased thick spit (sputum) production.  Increased wheezing.  Increased shortness of breath.  Rapid breathing.  Chest tightness. How is this diagnosed? Your medical history, a physical exam, and tests will help your health care provider make a diagnosis. Tests may include:  A chest X-ray.  Basic lab tests.  Sputum testing.  An arterial blood gas test. How is this treated? Depending on the severity of your COPD exacerbation, you may need to be admitted to a hospital for treatment. Some of the treatments commonly used to treat COPD exacerbations are:  Antibiotic medicines.  Bronchodilators. These are drugs that expand the air passages. They may be given with an inhaler or nebulizer. Spacer devices may be needed to help improve drug delivery.  Corticosteroid medicines.  Supplemental oxygen therapy.  Airway clearing techniques, such as noninvasive ventilation (NIV) and positive expiratory pressure (PEP). These provide respiratory support through a mask or other noninvasive device. Follow these instructions at home:  Do not smoke. Quitting smoking is very important to prevent COPD from getting worse and exacerbations from happening as often.  Avoid exposure to all substances that irritate the airway, especially to tobacco smoke.  If you were prescribed an antibiotic medicine, finish it all even if you start to feel better.  Take all medicines as directed by your health care provider.It is important to use correct technique with inhaled medicines.  Drink enough fluids to keep your  urine clear or pale yellow (unless you have a medical condition that requires fluid restriction).  Use a cool mist vaporizer. This makes it easier to clear your chest when you cough.  If you have a home nebulizer and oxygen, continue to use them as directed.  Maintain all necessary vaccinations to prevent infections.  Exercise regularly.  Eat a healthy diet.  Keep all follow-up appointments as directed by your health care provider. Get help right away if:  You have worsening shortness of breath.  You have trouble talking.  You have severe chest pain.  You have blood in your sputum.  You have a fever.  You have weakness, vomit repeatedly, or faint.  You feel confused.  You continue to get worse. This information is not intended to replace advice given to you by your health care provider. Make sure you discuss any questions you have with your health care provider. Document Released: 10/15/2006 Document Revised: 05/26/2015 Document Reviewed: 08/22/2012 Elsevier Interactive Patient Education  2017 [employer].  Discharge patient    Complete by:  As directed    Discharge disposition:  01-Home or Self Care   Discharge patient date:  03/06/2016   Increase activity slowly    Complete by:  As directed       Signed: Collier Salina, MD PGY-II Internal Medicine Resident Pager# [phone] 03/12/2016, 8:56 AM

## 2016-03-06 NOTE — Progress Notes (Signed)
[name] to be D/C'd Home per MD order.  Discussed prescriptions and follow up appointments with the patient. Prescriptions given to patient, medication list explained in detail. Pt verbalized understanding.  Allergies as of 03/06/2016      Reactions   Indomethacin Anaphylaxis, Other (See Comments)   "took it fine for awhile; one day I stopped breathing and I ended up in the hospital" (01/02/2012) Pt has tolerated aspirin      Medication List    TAKE these medications   acetaminophen 500 MG tablet Commonly known as:  TYLENOL Take 1,000 mg by mouth every 6 (six) hours as needed for mild pain. Reported on 07/14/2015   albuterol (2.5 MG/3ML) 0.083% nebulizer solution Commonly known as:  PROVENTIL Take 3 mLs (2.5 mg total) by nebulization every 6 (six) hours as needed for wheezing or shortness of breath.   albuterol 108 (90 Base) MCG/ACT inhaler Commonly known as:  PROVENTIL HFA;VENTOLIN HFA Inhale 2 puffs into the lungs every 6 (six) hours as needed for wheezing.   apixaban 5 MG Tabs tablet Commonly known as:  ELIQUIS Take 1 tablet (5 mg total) by mouth 2 (two) times daily.   azithromycin 250 MG tablet Commonly known as:  ZITHROMAX Take 1 tablet (250 mg total) by mouth daily. Start taking on:  03/07/2016   benzonatate 200 MG capsule Commonly known as:  TESSALON Take 1 capsule (200 mg total) by mouth 3 (three) times daily. 3 times daily as scheduled for 3-4 days,then 3 times daily as needed for cough   bisoprolol 5 MG tablet Commonly known as:  ZEBETA Take 1 tablet (5 mg total) by mouth daily.   cholecalciferol 1000 units tablet Commonly known as:  VITAMIN D Take 1,000 Units by mouth daily.   diphenhydramine-acetaminophen 25-500 MG Tabs tablet Commonly known as:  TYLENOL PM Take 1 tablet by mouth at bedtime.   ferrous sulfate 325 (65 FE) MG tablet Take 325 mg by mouth daily as needed (iron supplement).   furosemide 40 MG tablet Commonly known as:  LASIX Take 1 tablet  (40 mg total) by mouth daily.   polyethylene glycol packet Commonly known as:  MIRALAX / GLYCOLAX Take 17 g by mouth daily.   polyvinyl alcohol 1.4 % ophthalmic solution Commonly known as:  LIQUIFILM TEARS Place 1 drop into both eyes as needed for dry eyes.   potassium chloride 10 MEQ tablet Commonly known as:  K-DUR Take 1 tablet (10 mEq total) by mouth daily.   predniSONE 20 MG tablet Commonly known as:  DELTASONE Take 2 tablets (40 mg total) by mouth daily with breakfast. Start taking on:  03/07/2016   SYMBICORT 160-4.5 MCG/ACT inhaler Generic drug:  budesonide-formoterol Inhale 2 puffs into the lungs 2 (two) times daily.       Vitals:   03/06/16 0400 03/06/16 0939  BP: (!) 147/84 (!) 125/48  Pulse: 71 (!) 59  Resp: 20 18  Temp: 98 F (36.7 C) 97.9 F (36.6 C)    Skin clean, dry and intact without evidence of skin break down, no evidence of skin tears noted. IV catheter discontinued intact. Site without signs and symptoms of complications. Dressing and pressure applied. Pt denies pain at this time. No complaints noted.  An After Visit Summary was printed and given to the patient. Patient escorted via [place], and D/C home via private auto.  Retta Mac BSN, RN

## 2016-03-06 NOTE — Care Management Obs Status (Signed)
[place] NOTIFICATION   Patient Details  Name: [name] MRN: [id number] Date of Birth: 1925-05-21   Medicare Observation Status Notification Given:  Yes    Erenest Rasher, RN 03/06/2016, 9:40 AM

## 2016-03-06 NOTE — Progress Notes (Signed)
Internal Medicine Attending:   I saw and examined the patient. I reviewed the resident's note and I agree with the resident's findings and plan as documented in the resident's note. Patient is doing well, oxygen saturation documents at 98-100 % on 3.5 liters.  Procalcitonin is very low suggesting viral cause to her COPD exacerbation.  Therefore we have discontinued cefepime and started Azithromycin for COPD exacerbation.  I would like to wean her oxygen down to around 94%.  She can be discharged home to follow up with her PCP.  She should be started on a LAMA and will need outpatient PFTS as well as attempts to wean off supplemental oxygen.

## 2016-03-06 NOTE — Progress Notes (Signed)
   Subjective: [name] was seen and evaluated today at bedside. Reports breathing "is not great" however is back to what it is normally. Feels ready for discharge.   Objective:  Vital signs in last 24 hours: Vitals:   03/05/16 2111 03/06/16 0400 03/06/16 0912 03/06/16 0939  BP: (!) 130/7 (!) 147/84  (!) 125/48  Pulse: 68 71  (!) 59  Resp: 20 20  18   Temp: 97.7 F (36.5 C) 98 F (36.7 C)  97.9 F (36.6 C)  TempSrc: Oral Oral  Oral  SpO2: 100% 99% 98% 100%  Weight: 159 lb 6.3 oz (72.3 kg)     Height:       General: In no acute distress. Resting comfortably in bedside chair. Eating breakfast without issue. HENT: EOMI. No conjunctival injection, icterus or ptosis. Mucous membranes moist.  Cardiovascular: Regular rate and rhythm. No murmur or rub appreciated. Pulmonary: CTA BL. No wheezing. Unlabored breathing. On 3.5L O2 and appears comfortable. Abdomen: Soft, non-tender and non-distended. +bowel sounds.  Extremities: No peripheral edema noted BL. No gross deformities. Skin: Warm, dry. No cyanosis.  Psych: Mood normal and affect was mood congruent. Responds to questions appropriately.   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 Currently saturating well on 3.5 L O2 (home 3L). Procalcitonin <0.10, suggesting a viral cause of her COPD exacerbation. Currently on Azithromycin and Prednisone which are appropriate for this COPD exacerbation. We have asked respiratory therapy to evaluate the patients oxygen requirements before discharge  and also to ambulate her to evaluate for desaturation.  -She will be discharged home with 3 days of Abx to complete a 5 day course of Azithromycin. She will also be d/c home with 3 more days of Prednisone to complete a 5 day course. Mucinex PRN -She will be discharged with Symbicort (LABA, LAMA) -She will need follow up with her PCP and also likely pulmonology referral to re-evaluate her decline in pulm status -PFTs as outpatient -Albuterol PRN  Chronic Combined Systolic and Diastolic CHF:  She has chronic complaints of significant muscle cramping in BL LE. She is on lasix and bisoprolol. Appears euvolemic and mucous membranes are moist. She was given oral potassium replacement with K-dur with complete relief of these symptoms. Requests to be d/c home on potassium replacement.  -Will d/c home on her home medications of Lasix and bisoprolol -Will prescribe K-dur 10 mEq upon dc under the stipulation that she will follow up with her PCP on d/c to check potassium levels. Explained risks of this medication.  PAF: Not in afib. Anticoagulated with Eliquis 5 mg BID and rate controlled with Bisoprolol which will be continued upon discharge.  Dispo: Anticipated discharge today following O2 evaluation.   Caliya Narine, DO 03/06/2016, 12:06 PM Pager: [phone]

## 2016-03-06 NOTE — Progress Notes (Signed)
SATURATION QUALIFICATIONS: (This note is used to comply with regulatory documentation for home oxygen)  Patient Saturations on Room Air at Rest = 98%  Patient Saturations on Room Air while Ambulating = 91-93%  Patient Saturations on 0 Liters of oxygen while Ambulating = 91-93%  Please briefly explain why patient needs home oxygen:

## 2016-03-06 NOTE — Care Management Note (Signed)
Case Management Note  Patient Details  Name: [name] MRN: [id number] Date of Birth: Aug 30, 1925  Subjective/Objective:   COPD exacerbations, CHF, CAP, HTN                 Action/Plan: Discharge Planning: NCM spoke to pt and states she lives in home with dtr, Rory Percy. States she has Rollator, oxygen (portable in room) with [facility], neb machine, and bedside commode at home. Has HH with [facility] in the past.   PCP Leonard Downing MD   Expected Discharge Date:  03/08/16               Expected Discharge Plan:  Home/Self Care  In-House Referral:  NA  Discharge planning Services  CM Consult  Post Acute Care Choice:  NA Choice offered to:  NA  DME Arranged:  N/A DME Agency:  NA  HH Arranged:  NA HH Agency:  NA  Status of Service:  Completed, signed off  If discussed at [place] of Stay Meetings, dates discussed:    Additional Comments:  Erenest Rasher, RN 03/06/2016, 10:39 AM

## 2016-03-06 NOTE — Discharge Instructions (Signed)
You are being discharged with 3 additional days of medication to continue treating your episode of shortness of breath. You should take the azithromycin (1 pill) and prednisone (2 pills) once daily each for 3 more days starting Wednesday. If you have any trouble getting these medications at the pharmacy please call back immediately to the hospital or to our clinic at [phone]    Chronic Obstructive Pulmonary Disease Exacerbation Chronic obstructive pulmonary disease (COPD) is a common lung condition in which airflow from the lungs is limited. COPD is a general term that can be used to describe many different lung problems that limit airflow, including chronic bronchitis and emphysema. COPD exacerbations are episodes when breathing symptoms become much worse and require extra treatment. Without treatment, COPD exacerbations can be life threatening, and frequent COPD exacerbations can cause further damage to your lungs. What are the causes?  Respiratory infections.  Exposure to smoke.  Exposure to air pollution, chemical fumes, or dust. Sometimes there is no apparent cause or trigger. What increases the risk?  Smoking cigarettes.  Older age.  Frequent prior COPD exacerbations. What are the signs or symptoms?  Increased coughing.  Increased thick spit (sputum) production.  Increased wheezing.  Increased shortness of breath.  Rapid breathing.  Chest tightness. How is this diagnosed? Your medical history, a physical exam, and tests will help your health care provider make a diagnosis. Tests may include:  A chest X-ray.  Basic lab tests.  Sputum testing.  An arterial blood gas test. How is this treated? Depending on the severity of your COPD exacerbation, you may need to be admitted to a hospital for treatment. Some of the treatments commonly used to treat COPD exacerbations are:  Antibiotic medicines.  Bronchodilators. These are drugs that expand the air passages. They  may be given with an inhaler or nebulizer. Spacer devices may be needed to help improve drug delivery.  Corticosteroid medicines.  Supplemental oxygen therapy.  Airway clearing techniques, such as noninvasive ventilation (NIV) and positive expiratory pressure (PEP). These provide respiratory support through a mask or other noninvasive device. Follow these instructions at home:  Do not smoke. Quitting smoking is very important to prevent COPD from getting worse and exacerbations from happening as often.  Avoid exposure to all substances that irritate the airway, especially to tobacco smoke.  If you were prescribed an antibiotic medicine, finish it all even if you start to feel better.  Take all medicines as directed by your health care provider.It is important to use correct technique with inhaled medicines.  Drink enough fluids to keep your urine clear or pale yellow (unless you have a medical condition that requires fluid restriction).  Use a cool mist vaporizer. This makes it easier to clear your chest when you cough.  If you have a home nebulizer and oxygen, continue to use them as directed.  Maintain all necessary vaccinations to prevent infections.  Exercise regularly.  Eat a healthy diet.  Keep all follow-up appointments as directed by your health care provider. Get help right away if:  You have worsening shortness of breath.  You have trouble talking.  You have severe chest pain.  You have blood in your sputum.  You have a fever.  You have weakness, vomit repeatedly, or faint.  You feel confused.  You continue to get worse. This information is not intended to replace advice given to you by your health care provider. Make sure you discuss any questions you have with your health care provider. Document  Released: 10/15/2006 Document Revised: 05/26/2015 Document Reviewed: 08/22/2012 Elsevier Interactive Patient Education  2017 [employer].

## 2016-03-06 NOTE — Care Management CC44 (Signed)
Condition Code 44 Documentation Completed  Patient Details  Name: DAHNA ELLISOR MRN: [id number] Date of Birth: 10-18-1925   Condition Code 44 given:  Yes Patient signature on Condition Code 44 notice:  Yes Documentation of 2 MD's agreement:  Yes Code 44 added to claim:  Yes    Erenest Rasher, RN 03/06/2016, 9:40 AM

## 2016-03-09 LAB — CULTURE, BLOOD (ROUTINE X 2)
CULTURE: NO GROWTH
CULTURE: NO GROWTH

## 2016-04-05 ENCOUNTER — Telehealth: Payer: Self-pay | Admitting: Cardiovascular Disease

## 2016-04-05 NOTE — Telephone Encounter (Signed)
I left a detailed message on the confidential voice mail of [name] with [facility] regarding last echo 12/17 with EF of 45-50%. I advised her to call back with questions or concerns.

## 2016-04-05 NOTE — Telephone Encounter (Signed)
New Message:   Jocelyn Lamer from [facility] wants to know when pt had her last Echo and what was her Ejection Fraction.If she does not answer,please leave it on her voicemail.

## 2016-04-16 ENCOUNTER — Telehealth: Payer: Self-pay | Admitting: *Deleted

## 2016-04-16 ENCOUNTER — Ambulatory Visit: Payer: Medicare Other | Admitting: Neurology

## 2016-04-16 NOTE — Telephone Encounter (Signed)
Called pt. Advised appt cx due to power outage at office today. Dr Erlinda Hong nurse will call her back to r/s appt. She verbalized understanding and appreciation for call.

## 2016-04-17 NOTE — Telephone Encounter (Signed)
Rn call patient to schedule her for follow up with NP in May 2018.

## 2016-04-17 NOTE — Telephone Encounter (Signed)
Per Dr. Erlinda Hong pt can see Carolyn(NP) or him in the clinic in the next 3 months.

## 2016-05-18 ENCOUNTER — Encounter: Payer: Self-pay | Admitting: Nurse Practitioner

## 2016-05-18 ENCOUNTER — Ambulatory Visit (INDEPENDENT_AMBULATORY_CARE_PROVIDER_SITE_OTHER): Payer: Medicare Other | Admitting: Nurse Practitioner

## 2016-05-18 ENCOUNTER — Encounter (INDEPENDENT_AMBULATORY_CARE_PROVIDER_SITE_OTHER): Payer: Self-pay

## 2016-05-18 VITALS — BP 119/63 | HR 69 | Ht 60.0 in | Wt 161.0 lb

## 2016-05-18 DIAGNOSIS — I10 Essential (primary) hypertension: Secondary | ICD-10-CM

## 2016-05-18 DIAGNOSIS — I639 Cerebral infarction, unspecified: Secondary | ICD-10-CM

## 2016-05-18 DIAGNOSIS — R269 Unspecified abnormalities of gait and mobility: Secondary | ICD-10-CM | POA: Diagnosis not present

## 2016-05-18 NOTE — Patient Instructions (Addendum)
Stressed the importance of management of risk factors to prevent further stroke Continue eliquis for secondary stroke prevention and atrial fibrillation Maintain strict control of hypertension with blood pressure goal below 130/90, today's reading 119/63 continue antihypertensive medications Cholesterol with LDL cholesterol less than 80  followed by primary care,  most recent 80  Exercise by walking, slowly increase , eat healthy diet with whole grains,  fresh fruits and vegetables Discharge from stroke clinic  Stroke Prevention Some health problems and behaviors may make it more likely for you to have a stroke. Below are ways to lessen your risk of having a stroke.  Be active for at least 30 minutes on most or all days.  Do not smoke. Try not to be around others who smoke.  Do not drink too much alcohol.  Do not have more than 2 drinks a day if you are a man.  Do not have more than 1 drink a day if you are a woman and are not pregnant.  Eat healthy foods, such as fruits and vegetables. If you were put on a specific diet, follow the diet as told.  Keep your cholesterol levels under control through diet and medicines. Look for foods that are low in saturated fat, trans fat, cholesterol, and are high in fiber.  If you have diabetes, follow all diet plans and take your medicine as told.  Ask your doctor if you need treatment to lower your blood pressure. If you have high blood pressure (hypertension), follow all diet plans and take your medicine as told by your doctor.  If you are 25-51 years old, have your blood pressure checked every 3-5 years. If you are age 32 or older, have your blood pressure checked every year.  Keep a healthy weight. Eat foods that are low in calories, salt, saturated fat, trans fat, and cholesterol.  Do not take drugs.  Avoid birth control pills, if this applies. Talk to your doctor about the risks of taking birth control pills.  Talk to your doctor if you  have sleep problems (sleep apnea).  Take all medicine as told by your doctor.  You may be told to take aspirin or blood thinner medicine. Take this medicine as told by your doctor.  Understand your medicine instructions.  Make sure any other conditions you have are being taken care of. Get help right away if:  You suddenly lose feeling (you feel numb) or have weakness in your face, arm, or leg.  Your face or eyelid hangs down to one side.  You suddenly feel confused.  You have trouble talking (aphasia) or understanding what people are saying.  You suddenly have trouble seeing in one or both eyes.  You suddenly have trouble walking.  You are dizzy.  You lose your balance or your movements are clumsy (uncoordinated).  You suddenly have a very bad headache and you do not know the cause.  You have new chest pain.  Your heart feels like it is fluttering or skipping a beat (irregular heartbeat). Do not wait to see if the symptoms above go away. Get help right away. Call your local emergency services (911 in [place] Do not drive yourself to the hospital. This information is not intended to replace advice given to you by your health care provider. Make sure you discuss any questions you have with your health care provider. Document Released: 06/19/2011 Document Revised: 05/26/2015 Document Reviewed: 06/20/2012 Elsevier Interactive Patient Education  2017 [employer].

## 2016-05-18 NOTE — Progress Notes (Signed)
I have read the note, and I agree with the clinical assessment and plan.  WILLIS,CHARLES KEITH   

## 2016-05-18 NOTE — Progress Notes (Signed)
[facility]  PATIENT: [name] DOB: 01-Sep-1925   REASON FOR VISIT: Follow up for stroke HISTORY FROM:patient and son    HISTORY OF PRESENT ILLNESS:History Summary [name] is a 83 y.o. female with history of HTN, metastatic squamous cell carcinoma s/p radiation, and right shoulder surgery admitted on 04/11/15 for right hand weakness. MRI showed acute left pre-central gyrus small infarct as well as old right MCA/PCA infarct. Pt denies any previous stroke. MRA, CUS, TTE, and EEG unremarkable. TEE not able to tolerate. LDL 110 and A1C 5.7. She was discharged with plavix and lipitor. Recommended 30 day cardiac monitoring and consider RESPECT ESUS trial as outpt.  06/15/15 follow up - the patient has been doing well. Her right hand weakness much improved, still has mild dexterity difficulty but largely back to baseline. She had 30 day cardiac event monitoring and showed no afib. She had metastatic squamous cell carcinoma and finished radiation 09/2013, so far still following with oncology. As per daughter, cancer stable but not cured yet. BP 137/64.     Interval History10/16/17 Dr Erlinda Hong During the interval time, she was admitted in 08/2015 for pneumonia. During admission, she was found to have 2 episode of Afib on tele. Due to the embolic stroke history, with new findings of eliquis, she was put on eliquis 5mg  bid. She took one month of eliquis but currently waiting for approval for further eliquis from insurance company. She is now taking Xarelto instead. No bleeding side effects. BP today 134/70.  UPDATE 05/18/2018CM [name], 83 year old female returns for follow-up with her son. She has a history of stroke event in April 2017. She had pneumonia in August 2017 and found to be in atrial fibrillation on admission. She is now on eliquis for secondary stroke prevention and atrial fibrillation without any bruising or bleeding. Blood pressure in the office today 119/69. She  currently lives with her daughter.She has no new neurologic complaints she returns for reevaluation   REVIEW OF SYSTEMS: Full 14 system review of systems performed and notable only for those listed, all others are neg:  Constitutional: neg  Cardiovascular: neg Ear/Nose/Throat: neg  Skin: neg Eyes: neg Respiratory: neg Gastroitestinal: neg  Hematology/Lymphatic: neg  Endocrine: neg Musculoskeletal:neg Allergy/Immunology: neg Neurological: neg Psychiatric: neg Sleep : neg   ALLERGIES: Allergies  Allergen Reactions  . Indomethacin Anaphylaxis and Other (See Comments)    "took it fine for awhile; one day I stopped breathing and I ended up in the hospital" (01/02/2012) Pt has tolerated aspirin    HOME MEDICATIONS: Outpatient Medications Prior to Visit  Medication Sig Dispense Refill  . acetaminophen (TYLENOL) 500 MG tablet Take 1,000 mg by mouth every 6 (six) hours as needed for mild pain. Reported on 07/14/2015    . albuterol (PROVENTIL HFA;VENTOLIN HFA) 108 (90 BASE) MCG/ACT inhaler Inhale 2 puffs into the lungs every 6 (six) hours as needed for wheezing.    Marland Kitchen albuterol (PROVENTIL) (2.5 MG/3ML) 0.083% nebulizer solution Take 3 mLs (2.5 mg total) by nebulization every 6 (six) hours as needed for wheezing or shortness of breath. 75 mL 12  . apixaban (ELIQUIS) 5 MG TABS tablet Take 1 tablet (5 mg total) by mouth 2 (two) times daily. 120 tablet 0  . benzonatate (TESSALON) 200 MG capsule Take 1 capsule (200 mg total) by mouth 3 (three) times daily. 3 times daily as scheduled for 3-4 days,then 3 times daily as needed for cough 30 capsule 0  . cholecalciferol (VITAMIN  D) 1000 units tablet Take 1,000 Units by mouth daily.    . diphenhydramine-acetaminophen (TYLENOL PM) 25-500 MG TABS tablet Take 1 tablet by mouth at bedtime as needed.     . ferrous sulfate 325 (65 FE) MG tablet Take 325 mg by mouth daily as needed (iron supplement).    . furosemide (LASIX) 40 MG tablet Take 1 tablet (40 mg  total) by mouth daily. 30 tablet 0  . polyethylene glycol (MIRALAX / GLYCOLAX) packet Take 17 g by mouth daily.    . polyvinyl alcohol (LIQUIFILM TEARS) 1.4 % ophthalmic solution Place 1 drop into both eyes as needed for dry eyes.    . potassium chloride (K-DUR) 10 MEQ tablet Take 1 tablet (10 mEq total) by mouth daily. 30 tablet 0  . SYMBICORT 160-4.5 MCG/ACT inhaler Inhale 2 puffs into the lungs 2 (two) times daily.    . bisoprolol (ZEBETA) 5 MG tablet Take 1 tablet (5 mg total) by mouth daily. (Patient not taking: Reported on 05/18/2016) 30 tablet 0  . predniSONE (DELTASONE) 20 MG tablet Take 2 tablets (40 mg total) by mouth daily with breakfast. (Patient not taking: Reported on 05/18/2016) 6 tablet 0   No facility-administered medications prior to visit.     PAST MEDICAL HISTORY: Past Medical History:  Diagnosis Date  . Anemia    years ago  . Arthritis    "right shoulder" (01/02/2012)  . Bladder mass   . CAP (community acquired pneumonia) 01/02/2012   Lower lobe   . CHF (congestive heart failure) ([place])   . COPD (chronic obstructive pulmonary disease) ([place])   . Difficulty sleeping   . Diverticulitis of colon with perforation 01/14/2012   That is post sigmoid resection with [facility] pouch and end colostomy   . GERD (gastroesophageal reflux disease)    occasional  . Goiter    radioactive iodine ablation/notes 01/02/2012  . History of kidney stones    X 1  . Hypertension   . Hypothyroidism    "I have taken Synthroid before" (01/02/2012)   HAS HAD RADIOACTIVE IODINE TX 2 YR S AGO  . Intra-abdominal abscess ([place]) 01/14/2012  . PAF (paroxysmal atrial fibrillation) ([place])   . Pneumatosis of intestines 01/14/2012  . Pneumonia 01/02/2012; ? 2009  . Radiation 08/10/13-09/15/13   55 gray to vaginal/bladder mass  . SOB (shortness of breath)    'all the time" (01/02/2012)  . Stroke ([place])   . UTI (lower urinary tract infection) 01/03/2012    PAST SURGICAL HISTORY: Past Surgical History:  Procedure  Laterality Date  . ABDOMINAL HYSTERECTOMY  1980's  . APPENDECTOMY  1980's   "when they did hysterectomy" (01/02/2012)  . CATARACT EXTRACTION W/ INTRAOCULAR LENS  IMPLANT, BILATERAL  ?1990's   Bil  . COLON SURGERY    . COLOSTOMY N/A 03/12/2012   Procedure: COLOSTOMY;  Surgeon: Ralene Ok, MD;  Location: [place];  Service: General;  Laterality: N/A;  . COLOSTOMY REVISION N/A 03/12/2012   Procedure: COLON RESECTION SIGMOID ;  Surgeon: Ralene Ok, MD;  Location: [place];  Service: General;  Laterality: N/A;  . COLOSTOMY TAKEDOWN N/A 10/21/2012   Procedure: LAPAROSCOPIC COLOSTOMY TAKEDOWN AND HARTMANS ANASTOMOSIS, rigid proctoscopy;  Surgeon: Ralene Ok, MD;  Location: [facility];  Service: General;  Laterality: N/A;  . EYE SURGERY    . LYSIS OF ADHESION N/A 10/21/2012   Procedure: LYSIS OF ADHESION;  Surgeon: Ralene Ok, MD;  Location: [facility];  Service: General;  Laterality: N/A;  . TRANSURETHRAL RESECTION OF BLADDER  TUMOR WITH GYRUS (TURBT-GYRUS) N/A 06/30/2013   Procedure: TRANSURETHRAL RESECTION OF BLADDER TUMOR WITH GYRUS (TURBT-GYRUS)/VAGINAL BIOPSY;  Surgeon: Ardis Hughs, MD;  Location: [facility];  Service: Urology;  Laterality: N/A;    FAMILY HISTORY: Family History  Problem Relation Age of Onset  . Cancer Sister        Breast  . Stroke Sister   . Heart disease Mother   . Heart disease Unknown     SOCIAL HISTORY: Social History   Social History  . Marital status: Widowed    Spouse name: N/A  . Number of children: 6  . Years of education: N/A   Occupational History  . retired    Social History Main Topics  . Smoking status: Former Smoker    Packs/day: 0.12    Years: 35.00    Types: Cigarettes    Quit date: 01/02/1991  . Smokeless tobacco: Never Used  . Alcohol use No  . Drug use: No  . Sexual activity: No   Other Topics Concern  . Not on file   Social History Narrative  . No narrative on file     PHYSICAL EXAM  Vitals:   05/18/16 1032  BP:  119/63  Pulse: 69  Weight: 161 lb (73 kg)  Height: 5' (1.524 m)   Body mass index is 31.44 kg/m.  Generalized: Well developed, in no acute distress  Head: normocephalic and atraumatic,. Oropharynx benign  Neck: Supple, no carotid bruits  Cardiac: Regular rate rhythm, no murmur  Musculoskeletal: No deformity   Neurological examination   Mentation: Alert oriented to time, place, history taking. Attention span and concentration appropriate. Recent and remote memory intact.  Follows all commands speech and language fluent.   Cranial nerve II-XII: Pupils were equal round reactive to light extraocular movements were full, visual field were full on confrontational test. Facial sensation and strength were normal. hearing was intact to finger rubbing bilaterally. Uvula tongue midline. head turning and shoulder shrug were normal and symmetric.Tongue protrusion into cheek strength was normal. Motor: normal bulk and tone, full strength in the BUE, BLE, in all extremities except limited range of motion to right shoulder due to previous surgery  Sensory: normal and symmetric to light touch, in the upper and lower extremities  Coordination: finger-nose-finger, heel-to-shin bilaterally, no dysmetria Reflexes: 1+ upper lower and symmetric plantar responses were flexor bilaterally. Gait and Station: Ambulates 30 feet in the [place] with a rolling walker stooped posture and slow steady gait  DIAGNOSTIC DATA (LABS, IMAGING, TESTING) - I reviewed patient records, labs, notes, testing and imaging myself where available.  Lab Results  Component Value Date   WBC 9.4 03/06/2016   HGB 9.9 (L) 03/06/2016   HCT 28.1 (L) 03/06/2016   MCV 94.0 03/06/2016   PLT 255 03/06/2016      Component Value Date/Time   NA 137 03/06/2016 0556   NA 144 09/02/2014 1245   K 4.3 03/06/2016 0556   K 4.2 09/02/2014 1245   CL 104 03/06/2016 0556   CO2 26 03/06/2016 0556   CO2 29 09/02/2014 1245   GLUCOSE 115 (H) 03/06/2016  0556   GLUCOSE 95 09/02/2014 1245   BUN 18 03/06/2016 0556   BUN 21.8 09/02/2014 1245   CREATININE 1.01 (H) 03/06/2016 0556   CREATININE 0.75 05/09/2015 1157   CREATININE 1.1 09/02/2014 1245   CALCIUM 9.0 03/06/2016 0556   CALCIUM 9.4 09/02/2014 1245   PROT 6.6 04/11/2015 0950   PROT 7.2 09/02/2014 1245  ALBUMIN 3.7 04/11/2015 0950   ALBUMIN 3.9 09/02/2014 1245   AST 18 04/11/2015 0950   AST 15 09/02/2014 1245   ALT 12 (L) 04/11/2015 0950   ALT 10 09/02/2014 1245   ALKPHOS 61 04/11/2015 0950   ALKPHOS 86 09/02/2014 1245   BILITOT 0.8 04/11/2015 0950   BILITOT 0.52 09/02/2014 1245   GFRNONAA 48 (L) 03/06/2016 0556   GFRAA 56 (L) 03/06/2016 0556   Lab Results  Component Value Date   CHOL 147 12/30/2015   HDL 53 12/30/2015   LDLCALC 80 12/30/2015   TRIG 69 12/30/2015   CHOLHDL 2.8 12/30/2015   Lab Results  Component Value Date   HGBA1C 5.3 12/30/2015   Lab Results  Component Value Date   VITAMINB12 412 09/02/2014   Lab Results  Component Value Date   TSH 0.665 04/11/2015      ASSESSMENT AND PLAN 83 y.o. African American female with PMH of HTN, metastatic squamous cell carcinoma s/p radiation, and right shoulder surgery admitted on 04/11/15 for right hand weakness. MRI showed acute left pre-central gyrus small infarct as well as old right MCA/PCA infarct. Pt denies any previous stroke. MRA, CUS, TTE, and EEG unremarkable. TEE not able to tolerate. LDL 110 and A1C 5.7. She was discharged with plavix and lipitor. 30 day cardiac monitoring showed no afib and she is not RESPECT ESUS candidate due to metastatic squamous cell carcinoma. During the interval time, her right hand weakness resolved. She was admitted for pneumonia in 08/23/15 but found to have 2 episodes of afib, started on eliquis 5mg  bid. plavix discontinued. She returns to the stroke clinic today for follow-up without further stroke or TIA symptoms. The patient is a current patient of Dr. Erlinda Hong  who is out of the  office today . This note is sent to the work in doctor.      PLAN: Stressed the importance of management of risk factors to prevent further stroke Continue eliquis for secondary stroke prevention and atrial fibrillation Maintain strict control of hypertension with blood pressure goal below 130/90, today's reading 119/63 continue antihypertensive medications Cholesterol with LDL cholesterol less than 80  followed by primary care,  most recent 80  Exercise by walking, slowly increase , eat healthy diet with whole grains,  fresh fruits and vegetables Discharge from stroke clinic I spent 20 minutes in total face to face time with the patient and son more than 50% of which was spent counseling and coordination of care, reviewing test results reviewing medications and discussing and reviewing the diagnosis of stroke and management of risk factors Dennie Bible, [facility], [facility], APRN  [facility] [address], [place], [place] [address] [phone]

## 2016-07-19 ENCOUNTER — Encounter: Payer: Self-pay | Admitting: Radiation Oncology

## 2016-07-19 ENCOUNTER — Ambulatory Visit
Admission: RE | Admit: 2016-07-19 | Discharge: 2016-07-19 | Disposition: A | Discharge status: Home / Self Care | Payer: Medicare Other | Source: Ambulatory Visit | Attending: Radiation Oncology | Admitting: Radiation Oncology

## 2016-07-19 ENCOUNTER — Ambulatory Visit: Payer: Medicare Other | Admitting: Cardiovascular Disease

## 2016-07-19 DIAGNOSIS — C4492 Squamous cell carcinoma of skin, unspecified: Secondary | ICD-10-CM

## 2016-07-19 DIAGNOSIS — D494 Neoplasm of unspecified behavior of bladder: Secondary | ICD-10-CM | POA: Insufficient documentation

## 2016-07-19 DIAGNOSIS — D4959 Neoplasm of unspecified behavior of other genitourinary organ: Secondary | ICD-10-CM | POA: Insufficient documentation

## 2016-07-19 DIAGNOSIS — C801 Malignant (primary) neoplasm, unspecified: Secondary | ICD-10-CM | POA: Insufficient documentation

## 2016-07-19 NOTE — Progress Notes (Signed)
Radiation Oncology         [phone] ________________________________  Name: [name] MRN: [id number]  Date: 07/19/2016  DOB: August 07, 1925    Follow-Up Visit Note  CC: Leonard Downing, MD  Everitt Amber, MD    ICD-10-CM   1. Squamous cell carcinoma of unknown origin ([place]) C80.1     Diagnosis:  Squamous cell carcinoma of unknown origin presenting in the bladder and upper vaginal area  Interval Since Last Radiation: 2 years and 10 months 08/10/13 - 09/15/13: 55 gray in 25 fractions (2.2 gray per fraction) treatment was directed at the vaginal/bladder mass  Narrative:  The patient returns today for routine follow-up. She is accompanied by family today.  On review of systems, the patient denies pain. She denies difficulty swallowing. She reports having urinary urgency as well as occasional incontinence. She denies bowel concerns, or vaginal bleeding/discharge. She reports a poor energy level. The patient reports she is not using her vaginal dilator. The patient uses 2L oxygen via nasal cannula at home or at rest, and 3L when out or during physical exertion.  ALLERGIES:  is allergic to indomethacin.  Meds: Current Outpatient Prescriptions  Medication Sig Dispense Refill  . albuterol (PROVENTIL HFA;VENTOLIN HFA) 108 (90 BASE) MCG/ACT inhaler Inhale 2 puffs into the lungs every 6 (six) hours as needed for wheezing.    Marland Kitchen albuterol (PROVENTIL) (2.5 MG/3ML) 0.083% nebulizer solution Take 3 mLs (2.5 mg total) by nebulization every 6 (six) hours as needed for wheezing or shortness of breath. 75 mL 12  . apixaban (ELIQUIS) 5 MG TABS tablet Take 1 tablet (5 mg total) by mouth 2 (two) times daily. 120 tablet 0  . cholecalciferol (VITAMIN D) 1000 units tablet Take 1,000 Units by mouth daily.    . diphenhydramine-acetaminophen (TYLENOL PM) 25-500 MG TABS tablet Take 1 tablet by mouth at bedtime as needed.     . ferrous sulfate 325 (65 FE) MG tablet Take 325 mg by mouth daily as needed  (iron supplement).    . furosemide (LASIX) 40 MG tablet Take 1 tablet (40 mg total) by mouth daily. 30 tablet 0  . lisinopril (PRINIVIL,ZESTRIL) 20 MG tablet Take 20 mg by mouth daily.     Marland Kitchen loratadine (CLARITIN) 10 MG tablet Take 10 mg by mouth daily.    . polyethylene glycol (MIRALAX / GLYCOLAX) packet Take 17 g by mouth daily.    . polyvinyl alcohol (LIQUIFILM TEARS) 1.4 % ophthalmic solution Place 1 drop into both eyes as needed for dry eyes.    . SYMBICORT 160-4.5 MCG/ACT inhaler Inhale 2 puffs into the lungs 2 (two) times daily.    Marland Kitchen acetaminophen (TYLENOL) 500 MG tablet Take 1,000 mg by mouth every 6 (six) hours as needed for mild pain. Reported on 07/14/2015    . benzonatate (TESSALON) 200 MG capsule Take 1 capsule (200 mg total) by mouth 3 (three) times daily. 3 times daily as scheduled for 3-4 days,then 3 times daily as needed for cough (Patient not taking: Reported on 07/19/2016) 30 capsule 0  . potassium chloride (K-DUR) 10 MEQ tablet Take 1 tablet (10 mEq total) by mouth daily. (Patient not taking: Reported on 07/19/2016) 30 tablet 0   No current facility-administered medications for this encounter.    REVIEW OF SYSTEMS: A 10+ POINT REVIEW OF SYSTEMS WAS OBTAINED including neurology, dermatology, psychiatry, cardiac, respiratory, lymph, extremities, GI, GU, musculoskeletal, constitutional, reproductive, HEENT. All pertinent positives are noted in the HPI. All others are negative.  Physical  Findings: The patient is in no acute distress. Patient is alert and oriented.  height is 5' (1.524 m) and weight is 159 lb 12.8 oz (72.5 kg). Her oral temperature is 98 F (36.7 C). Her blood pressure is 127/48 (abnormal) and her pulse is 71. Her oxygen saturation is 99%.   Lungs are clear to auscultation bilaterally. 2L of oxygen via nasal cannula. Heart has regular rate and rhythm. No palpable cervical, supraclavicular, or axillary adenopathy. Significant swelling of her right thyroid gland, larger  than prior exam. Abdomen soft, non-tender, with normal bowel sounds.  On pelvic examination the external genitalia were unremarkable. A speculum exam was performed. There are no mucosal lesions noted in the vaginal vault. On bimanual and rectovaginal examination there were no pelvic masses appreciated.  Lab Findings: Lab Results  Component Value Date   WBC 9.4 03/06/2016   HGB 9.9 (L) 03/06/2016   HCT 28.1 (L) 03/06/2016   MCV 94.0 03/06/2016   PLT 255 03/06/2016    Radiographic Findings: No results found.  Impression:  No evidence of recurrence on clinical exam today.   Plan:  Patient will follow up with me in 6 months. I have recommended she follow-up with her primary care physician concerning her thyroid enlargement  -----------------------------------  Blair Promise, PhD, MD  This document serves as a record of services personally performed by Gery Pray, MD. It was created on his behalf by Maryla Morrow, a trained medical scribe. The creation of this record is based on the scribe's personal observations and the provider's statements to them. This document has been checked and approved by the attending provider.

## 2016-07-19 NOTE — Progress Notes (Signed)
[name] is here for follow up.  She denies having pain.  She reports having urinary urgency and has occasional incontinence.  She denies having any bowel issues or vaginal bleeding/discharge.  She reports having a poor energy level.  She is not using a vaginal dilator.  BP (!) 127/48 (BP Location: Left Arm, Patient Position: Sitting)   Pulse 71   Temp 98 F (36.7 C) (Oral)   Ht 5' (1.524 m)   Wt 159 lb 12.8 oz (72.5 kg)   SpO2 99%   BMI 31.21 kg/m    Wt Readings from Last 3 Encounters:  07/19/16 159 lb 12.8 oz (72.5 kg)  05/18/16 161 lb (73 kg)  03/05/16 159 lb 6.3 oz (72.3 kg)

## 2016-09-04 IMAGING — DX DG CHEST 2V
2 series · 2 of 2 positions shown · non-contrast
Comparison: Chest radiograph 04/11/2015

CLINICAL DATA: Patient with productive cough and fever.

EXAM:
CHEST  2 VIEW

[chest lat]
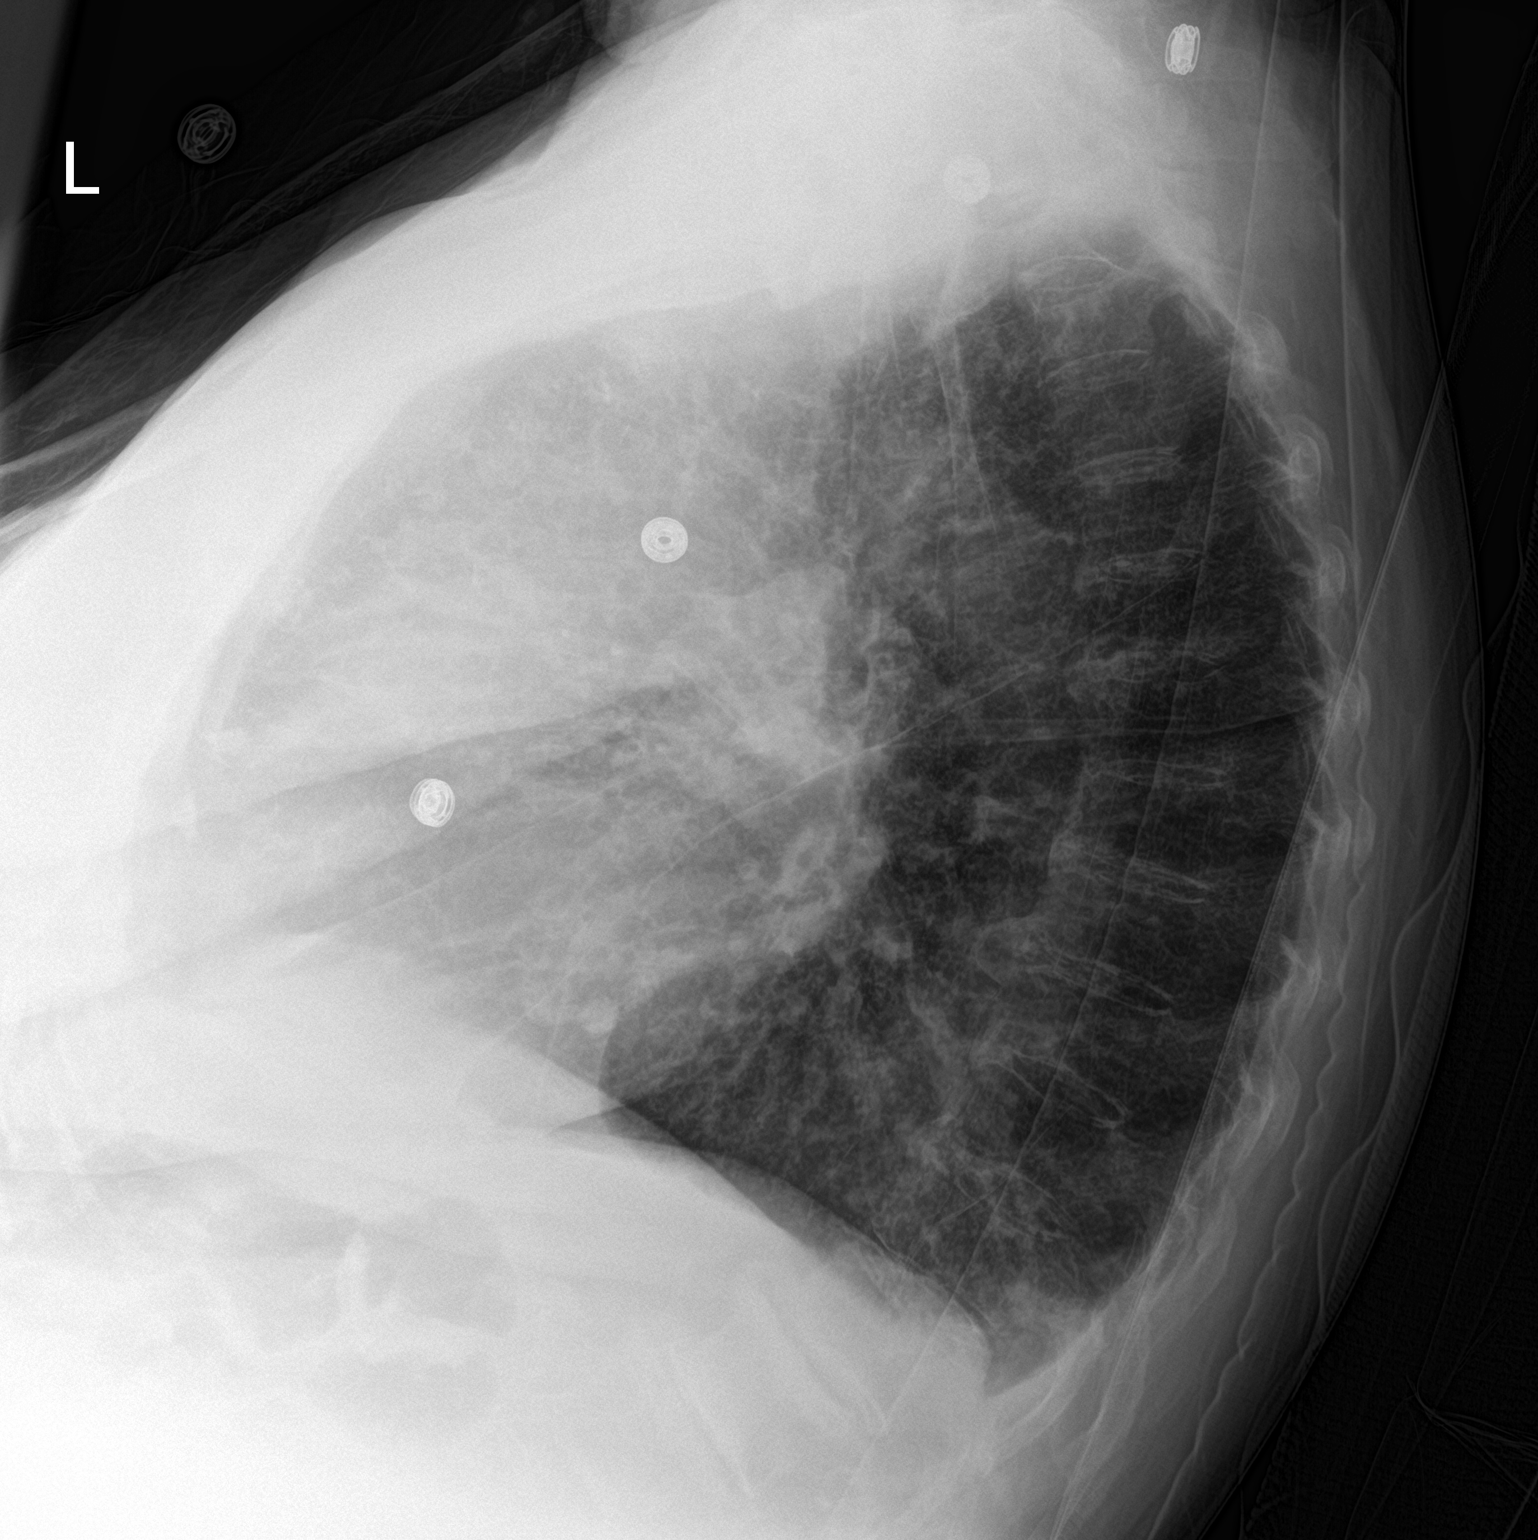

[chest ap]
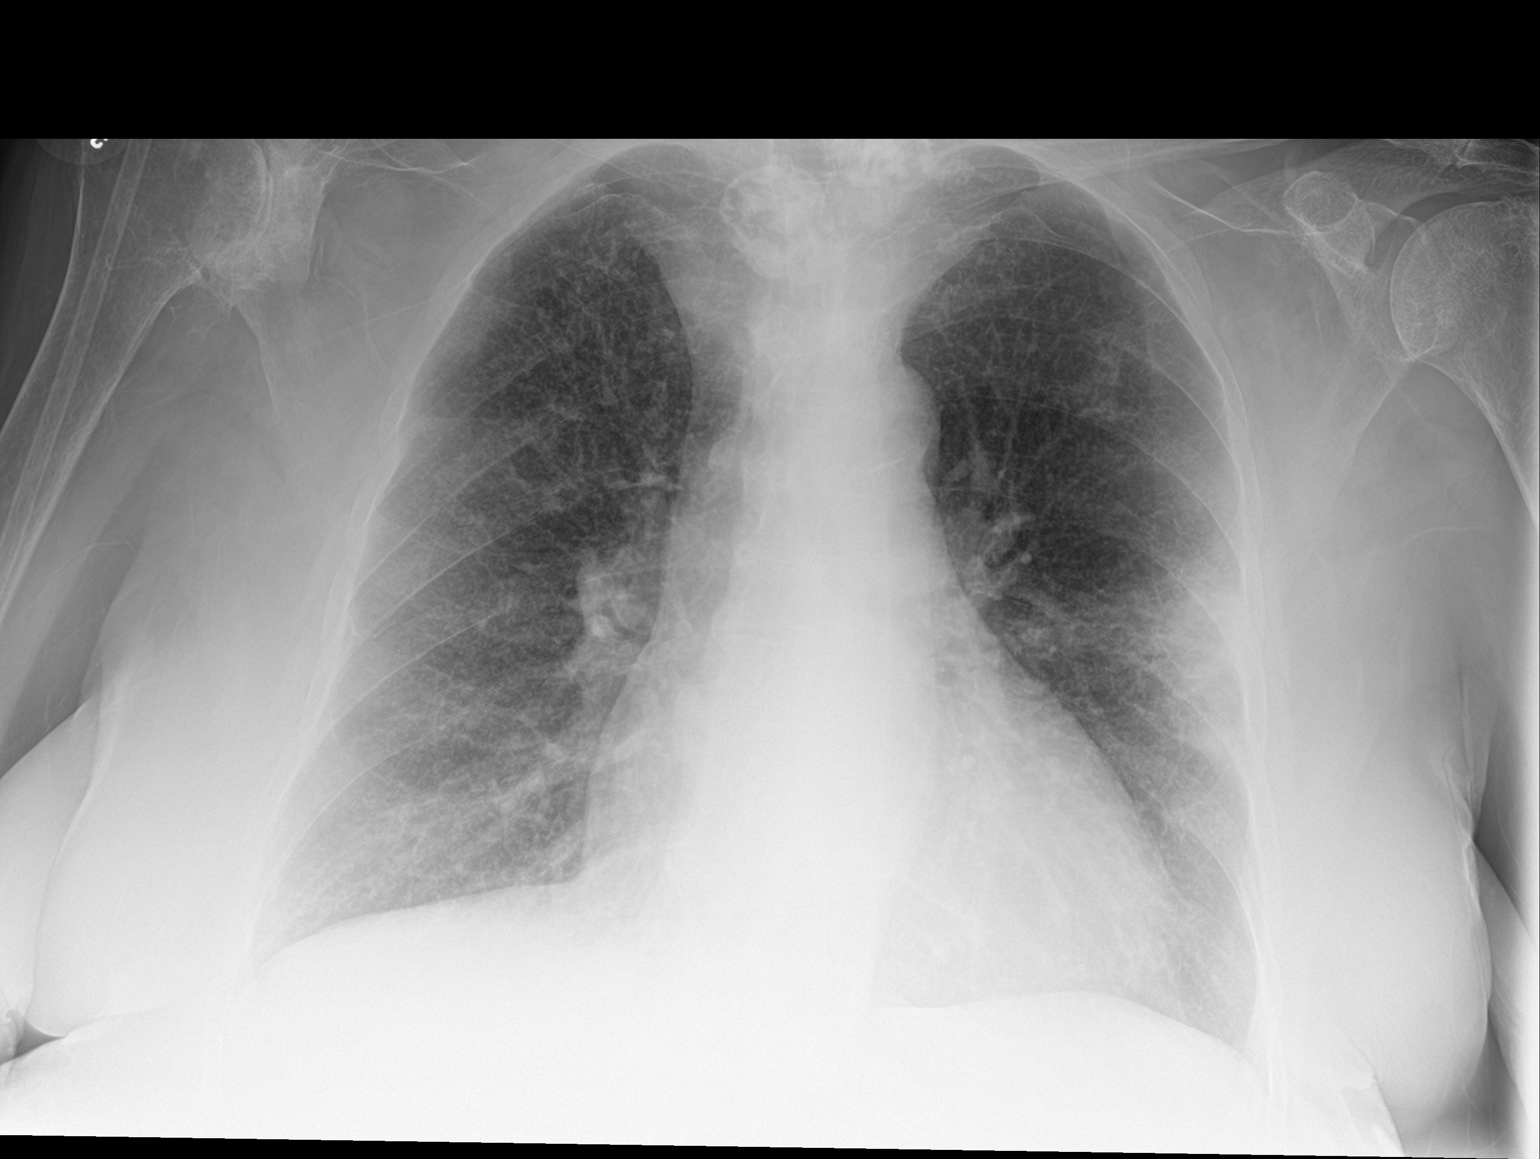

[2 of 2 positions shown; findings below may reference images not displayed]

FINDINGS: Stable cardiomegaly. Interval development of peripheral
consolidation within the left mid lung. No pleural effusion or
pneumothorax. Right shoulder joint degenerative changes. Thoracic
spine degenerative changes. Multiple calcified thyroid nodules.
IMPRESSION: New peripheral consolidation within the left mid lung concerning for
pneumonia. Followup PA and lateral chest X-ray is recommended in 3-4
weeks following trial of antibiotic therapy to ensure resolution and
exclude underlying malignancy.

## 2016-09-26 ENCOUNTER — Ambulatory Visit (INDEPENDENT_AMBULATORY_CARE_PROVIDER_SITE_OTHER): Payer: Medicare Other | Admitting: Cardiovascular Disease

## 2016-09-26 ENCOUNTER — Encounter: Payer: Self-pay | Admitting: Cardiovascular Disease

## 2016-09-26 VITALS — BP 146/74 | HR 70 | Ht 60.0 in | Wt 160.8 lb

## 2016-09-26 DIAGNOSIS — J449 Chronic obstructive pulmonary disease, unspecified: Secondary | ICD-10-CM

## 2016-09-26 DIAGNOSIS — I480 Paroxysmal atrial fibrillation: Secondary | ICD-10-CM | POA: Diagnosis not present

## 2016-09-26 DIAGNOSIS — I5042 Chronic combined systolic (congestive) and diastolic (congestive) heart failure: Secondary | ICD-10-CM

## 2016-09-26 NOTE — Progress Notes (Signed)
Cardiology Office Note   Date:  09/26/2016   ID:  [name], DOB 14-Jul-1925, MRN [id number]  PCP:  Leonard Downing, MD  Cardiologist:   Mertie Moores, MD   Chief Complaint  Patient presents with  . Follow-up    chronic combined systolic and diastolic CHF   1. HTN 2. CHF 3. Hypothyroidism 4. diverticulitis  [name] is an 83 yo with hx diverticulitis with diverticulosis in April. [name] had a perforated diverticulum and became septic. [name] had emergent surgery with a colostomy placement. [name] now scheduled to have reversal of her colostomy and the surgeons are requesting cardiac clearance. When [name] was hospitalized [name] had an echocardiogram that revealed mildly depressed left ventricular systolic function with an ejection fraction of 40-45%. [name] did have some diastolic dysfunction.  Left ventricle: The cavity size was moderately dilated. Wall thickness was normal. Systolic function was mildly to moderately reduced. The estimated ejection fraction was in the range of 40% to 45%. There was an increased relative contribution of atrial contraction to ventricular filling. - Left atrium: The atrium was mildly dilated  He has not had any cardiac problems since I last saw her 7 years ago. [name] does all of her housework without any significant problems. [name]'s able to calm 1 flight of stairs without difficulty. [name] thinks that [name] would become short of breath if I asked her to climb 2 flights of stairs.  April, 2015:  [name] is doing well. [name] tolerated her colostomy reversal last year without problems. Able to do all of her normal activities without any chest pain or shortness breath. [name] still able to go up steps although [name] comments that [name] climbs slowly.   [name] 12, 2016  [name] is a 83 y.o. female who presents for follow-up of her mild congestive heart failure. [name] was diagnosed with squamous cell carcinoma of unknown primary. [name] was hospitalized in Feb, 2016 with  pneumonia   Breathing is ok.  A little short of breath  Her BP readings at physical therapy are all in the normal range. [name] did not take her meds this am and the BP is a bit high  April 20, 2015:  [name] was hospitalized with a stroke. [name] was scheduled for TEE but we were unable to pass the scope. After much effort it was determined that we would likely need general anesthesia. I made the determination given her age and the fact that a transesophageal echo would probably not change our management much that we would cancel the transesophageal echo. The plan was to place a 30 day event monitor for further evaluation to look for atrial fibrillation.  May 27, 2015:  [name] is seen back today . Her 30 day monitor showed no atrial fib.  [name] is now on a lower dose of lasix  Is having some shortness of breath  - walked from the waiting room to the exam room .  No CP .    Is supposed to be using home O2 but does not have a portable tank that holds oxygen.   Nov. 27, 2017:  [name] is seen today .   Was in the hospital with COPD. Had a brief run of atrial fib. Was started on Eliquis.  Plavix was stopped.   Has had some ankle swelling  Took extra lasix for a few days  which helped.  BP is a bit elevated today.   Sept. 26, 2018:  Continues to have dyspnea.  Wears home O2.  Especially in the mornings.  Coughs up lots of sputum .  Has to force herself to cough  No CP  No leg swelling  Is on Home O2,  [name] does not know who is following that   Past Medical History:  Diagnosis Date  . Anemia    years ago  . Arthritis    "right shoulder" (01/02/2012)  . Bladder mass   . CAP (community acquired pneumonia) 01/02/2012   Lower lobe   . CHF (congestive heart failure) ([place])   . COPD (chronic obstructive pulmonary disease) ([place])   . Difficulty sleeping   . Diverticulitis of colon with perforation 01/14/2012   That is post sigmoid resection with [facility] pouch and end colostomy   . GERD  (gastroesophageal reflux disease)    occasional  . Goiter    radioactive iodine ablation/notes 01/02/2012  . History of kidney stones    X 1  . Hypertension   . Hypothyroidism    "I have taken Synthroid before" (01/02/2012)   HAS HAD RADIOACTIVE IODINE TX 2 YR S AGO  . Intra-abdominal abscess ([place]) 01/14/2012  . PAF (paroxysmal atrial fibrillation) ([place])   . Pneumatosis of intestines 01/14/2012  . Pneumonia 01/02/2012; ? 2009  . Radiation 08/10/13-09/15/13   57 gray to vaginal/bladder mass  . SOB (shortness of breath)    'all the time" (01/02/2012)  . Stroke ([place])   . UTI (lower urinary tract infection) 01/03/2012    Past Surgical History:  Procedure Laterality Date  . ABDOMINAL HYSTERECTOMY  1980's  . APPENDECTOMY  1980's   "when they did hysterectomy" (01/02/2012)  . CATARACT EXTRACTION W/ INTRAOCULAR LENS  IMPLANT, BILATERAL  ?1990's   Bil  . COLON SURGERY    . COLOSTOMY N/A 03/12/2012   Procedure: COLOSTOMY;  Surgeon: Ralene Ok, MD;  Location: [place];  Service: General;  Laterality: N/A;  . COLOSTOMY REVISION N/A 03/12/2012   Procedure: COLON RESECTION SIGMOID ;  Surgeon: Ralene Ok, MD;  Location: [place];  Service: General;  Laterality: N/A;  . COLOSTOMY TAKEDOWN N/A 10/21/2012   Procedure: LAPAROSCOPIC COLOSTOMY TAKEDOWN AND HARTMANS ANASTOMOSIS, rigid proctoscopy;  Surgeon: Ralene Ok, MD;  Location: [facility];  Service: General;  Laterality: N/A;  . EYE SURGERY    . LYSIS OF ADHESION N/A 10/21/2012   Procedure: LYSIS OF ADHESION;  Surgeon: Ralene Ok, MD;  Location: [facility];  Service: General;  Laterality: N/A;  . TRANSURETHRAL RESECTION OF BLADDER TUMOR WITH GYRUS (TURBT-GYRUS) N/A 06/30/2013   Procedure: TRANSURETHRAL RESECTION OF BLADDER TUMOR WITH GYRUS (TURBT-GYRUS)/VAGINAL BIOPSY;  Surgeon: Ardis Hughs, MD;  Location: [facility];  Service: Urology;  Laterality: N/A;     Current Outpatient Prescriptions  Medication Sig Dispense Refill  . acetaminophen  (TYLENOL) 500 MG tablet Take 1,000 mg by mouth every 6 (six) hours as needed for mild pain. Reported on 07/14/2015    . albuterol (PROVENTIL HFA;VENTOLIN HFA) 108 (90 BASE) MCG/ACT inhaler Inhale 2 puffs into the lungs every 6 (six) hours as needed for wheezing.    Marland Kitchen albuterol (PROVENTIL) (2.5 MG/3ML) 0.083% nebulizer solution Take 3 mLs (2.5 mg total) by nebulization every 6 (six) hours as needed for wheezing or shortness of breath. 75 mL 12  . apixaban (ELIQUIS) 5 MG TABS tablet Take 1 tablet (5 mg total) by mouth 2 (two) times daily. 120 tablet 0  . cholecalciferol (VITAMIN D) 1000 units tablet Take 1,000 Units by mouth daily.    . diphenhydramine-acetaminophen (TYLENOL PM)  25-500 MG TABS tablet Take 1 tablet by mouth at bedtime as needed.     . ferrous sulfate 325 (65 FE) MG tablet Take 325 mg by mouth daily as needed (iron supplement).    . furosemide (LASIX) 40 MG tablet Take 1 tablet (40 mg total) by mouth daily. 30 tablet 0  . lisinopril (PRINIVIL,ZESTRIL) 20 MG tablet Take 20 mg by mouth daily.     Marland Kitchen loratadine (CLARITIN) 10 MG tablet Take 10 mg by mouth daily.    . polyethylene glycol (MIRALAX / GLYCOLAX) packet Take 17 g by mouth daily.    . polyvinyl alcohol (LIQUIFILM TEARS) 1.4 % ophthalmic solution Place 1 drop into both eyes as needed for dry eyes.    . SYMBICORT 160-4.5 MCG/ACT inhaler Inhale 2 puffs into the lungs 2 (two) times daily.     No current facility-administered medications for this visit.     Allergies:   Indomethacin    Social History:  The patient  reports that [name] quit smoking about 25 years ago. Her smoking use included Cigarettes. [name] has a 4.20 pack-year smoking history. [name] has never used smokeless tobacco. [name] reports that [name] does not drink alcohol or use drugs.   Family History:  The patient's family history includes Cancer in her sister; Heart disease in her mother and unknown relative; Stroke in her sister.    ROS:  Please see the history of present  illness.    Review of Systems: Constitutional:  denies fever, chills, diaphoresis, appetite change and fatigue.  HEENT: denies photophobia, eye pain, redness, hearing loss, ear pain, congestion, sore throat, rhinorrhea, sneezing, neck pain, neck stiffness and tinnitus.  Respiratory: denies SOB, DOE, cough, chest tightness, and wheezing.  Cardiovascular: denies chest pain, palpitations and leg swelling.  Gastrointestinal: denies nausea, vomiting, abdominal pain, diarrhea, constipation, blood in stool.  Genitourinary: denies dysuria, urgency, frequency, hematuria, flank pain and difficulty urinating.  Musculoskeletal: denies  myalgias, back pain, joint swelling, arthralgias and gait problem.   Skin: denies pallor, rash and wound.  Neurological: denies dizziness, seizures, syncope, weakness, light-headedness, numbness and headaches.   Hematological: denies adenopathy, easy bruising, personal or family bleeding history.  Psychiatric/ Behavioral: denies suicidal ideation, mood changes, confusion, nervousness, sleep disturbance and agitation.       All other systems are reviewed and negative.   Physical Exam: Blood pressure (!) 146/74, pulse 70, height 5' (1.524 m), weight 160 lb 12.8 oz (72.9 kg), SpO2 98 %.  GEN:  Well nourished, well developed in no acute distress HEENT: Normal NECK: No JVD; No carotid bruits LYMPHATICS: No lymphadenopathy CARDIAC: RRR , no murmurs, rubs, gallops RESPIRATORY:  Clear to auscultation without rales, wheezing or rhonchi  ABDOMEN: Soft, non-tender, non-distended MUSCULOSKELETAL:  No edema; No deformity  SKIN: Warm and dry NEUROLOGIC:  Alert and oriented x 3   EKG:  EKG is not ordered today.   Recent Labs: 12/29/2015: Magnesium 2.0 03/04/2016: B Natriuretic Peptide 564.1 03/06/2016: BUN 18; Creatinine, Ser 1.01; Hemoglobin 9.9; Platelets 255; Potassium 4.3; Sodium 137    Lipid Panel    Component Value Date/Time   CHOL 147 12/30/2015 0442   TRIG 69  12/30/2015 0442   HDL 53 12/30/2015 0442   CHOLHDL 2.8 12/30/2015 0442   VLDL 14 12/30/2015 0442   LDLCALC 80 12/30/2015 0442    Wt Readings from Last 3 Encounters:  09/26/16 160 lb 12.8 oz (72.9 kg)  07/19/16 159 lb 12.8 oz (72.5 kg)  05/18/16 161 lb (73 kg)  Other studies Reviewed: Additional studies/ records that were reviewed today include: . Review of the above records demonstrates:    ASSESSMENT AND PLAN:  1. HTN -  BP is well controlled     2. Chronic combined systolic and diastolic congestive heart failure -no worsening of her CHF.  3. Hypothyroidism 4. diverticulitis 5. Atrial fib - was found to have PAF.   This is likely the cause of her cryptogenic stroke.   Is now on Eliquis.   Off plavix  6.  Possible COPD:   [name]'s been on home oxygen for at least a year. [name]'s not had a doctor manage this. We will refer her to pulmonary medicine for further evaluation and management of her COPD and for management of her home oxygen therapy.   Current medicines are reviewed at length with the patient today.  The patient does not have concerns regarding medicines.  The following changes have been made:  no change  Labs/ tests ordered today include:  No orders of the defined types were placed in this encounter.   Disposition:   FU with me in 6 months  Mertie Moores, MD  09/26/2016 9:42 AM    [facility] [address], [place], [place]  [address] Phone: [phone]; Fax: [phone]

## 2016-09-26 NOTE — Patient Instructions (Addendum)
Medication Instructions:  Your physician recommends that you continue on your current medications as directed. Please refer to the Current Medication list given to you today.   Labwork: None Ordered   Testing/Procedures: None Ordered   Follow-Up: You have been referred to Pulmonary Medicine for management of your COPD and home oxygen You should receive a call from [facility] to schedule an appointment  Your physician wants you to follow-up in: 6 months with Dr. Acie Fredrickson.  You will receive a reminder letter in the mail two months in advance. If you don't receive a letter, please call our office to schedule the follow-up appointment.   If you need a refill on your cardiac medications before your next appointment, please call your pharmacy.   Thank you for choosing [facility]! Christen Bame, RN [phone]

## 2016-10-01 ENCOUNTER — Ambulatory Visit: Payer: Medicare Other | Admitting: Endocrinology

## 2016-10-15 ENCOUNTER — Other Ambulatory Visit: Payer: Self-pay | Admitting: Internal Medicine

## 2016-10-15 DIAGNOSIS — R221 Localized swelling, mass and lump, neck: Secondary | ICD-10-CM

## 2016-10-22 ENCOUNTER — Ambulatory Visit (HOSPITAL_COMMUNITY)
Admission: RE | Admit: 2016-10-22 | Discharge: 2016-10-22 | Disposition: A | Discharge status: Home / Self Care | Payer: Medicare Other | Source: Ambulatory Visit | Attending: Internal Medicine | Admitting: Internal Medicine

## 2016-10-22 DIAGNOSIS — E049 Nontoxic goiter, unspecified: Secondary | ICD-10-CM | POA: Insufficient documentation

## 2016-10-22 DIAGNOSIS — J386 Stenosis of larynx: Secondary | ICD-10-CM | POA: Diagnosis not present

## 2016-10-22 DIAGNOSIS — E0789 Other specified disorders of thyroid: Secondary | ICD-10-CM | POA: Insufficient documentation

## 2016-10-22 DIAGNOSIS — R221 Localized swelling, mass and lump, neck: Secondary | ICD-10-CM

## 2016-10-22 IMAGING — CR DG CHEST 2V
2 series · 2 of 2 positions shown · non-contrast
Comparison: 09/15/2015

CLINICAL DATA: Followup pneumonia.

EXAM:
CHEST  2 VIEW

[w chest pa]
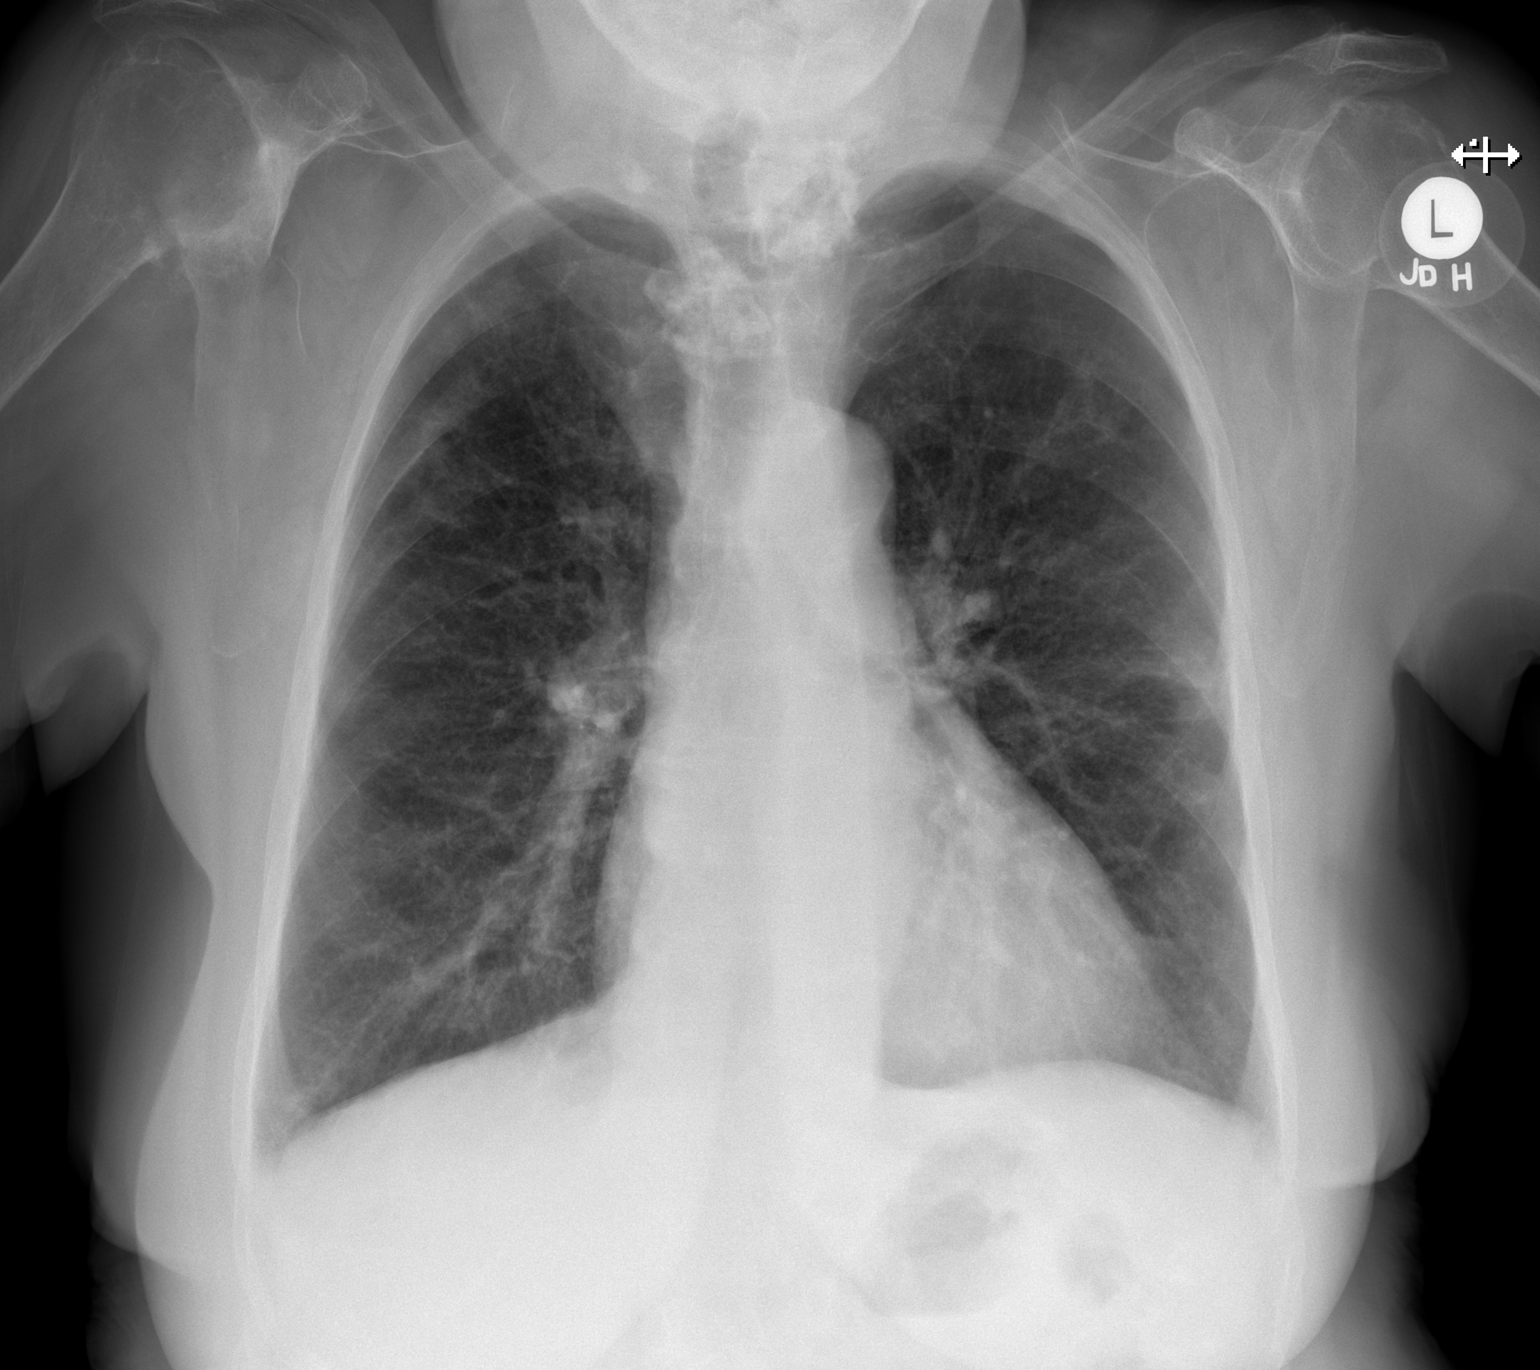

[w chest lat]
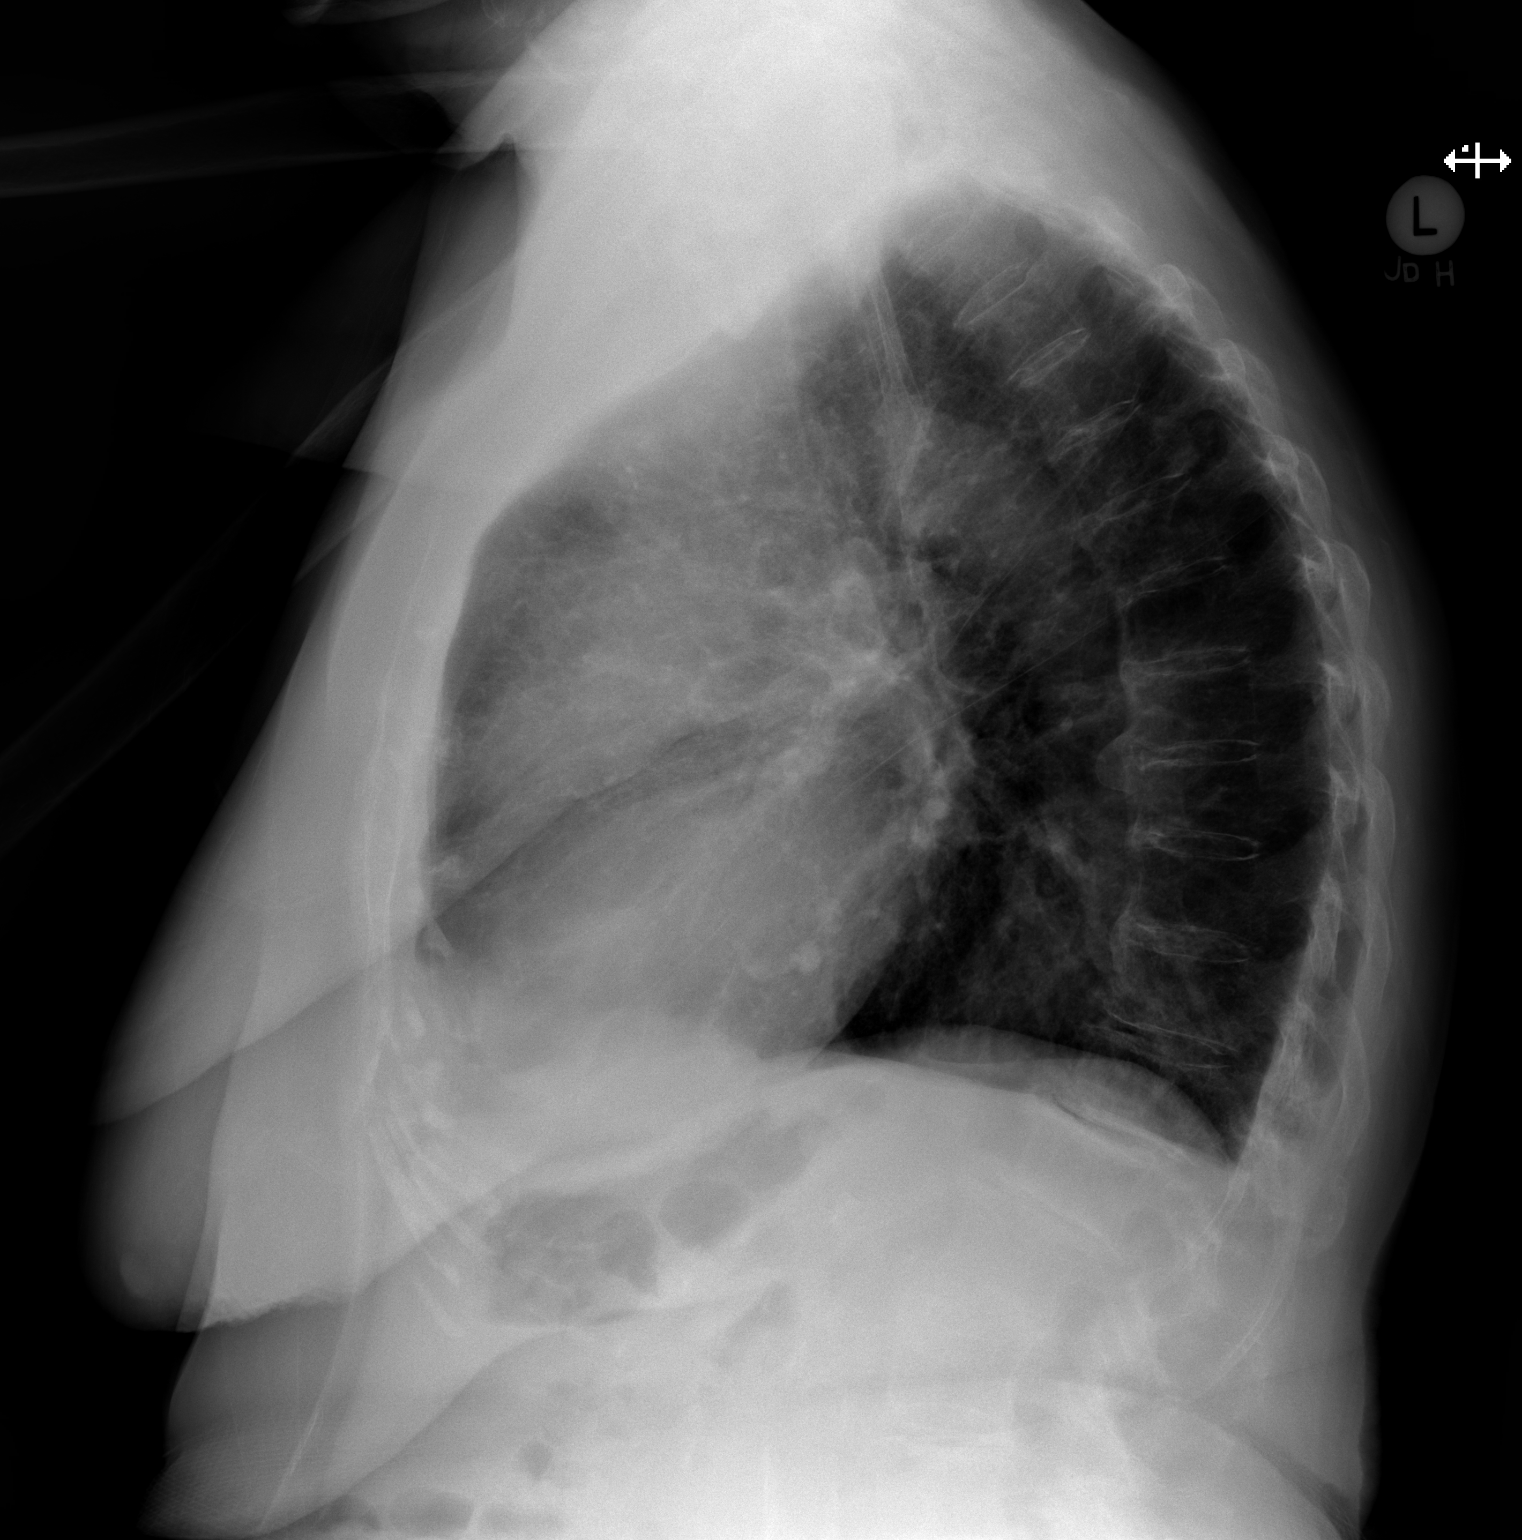

[2 of 2 positions shown; findings below may reference images not displayed]

FINDINGS: Chronic cardiomegaly. Stable mediastinal contours including bulky
bilateral calcified thyroid nodules.

Stable distribution and decreasing prominence of patchy bilateral
airspace opacity. These areas were [REDACTED] and remain most
consistent with an infectious or inflammatory process. No edema,
effusion, or pneumothorax.

Diffuse degenerative change in the spine with multilevel
thoracolumbar retrolisthesis. Severe right glenohumeral
osteoarthritis.
IMPRESSION: 1. Improving bilateral airspace disease.
2. Chronic findings are stable from prior and described above.

## 2016-10-22 MED ORDER — IOPAMIDOL (ISOVUE-300) INJECTION 61%
INTRAVENOUS | Status: AC
  Administered 2016-10-22: 75 mL via INTRAVENOUS
  Filled 2016-10-22: qty 75

## 2016-10-29 ENCOUNTER — Encounter: Payer: Self-pay | Admitting: Pulmonary Disease

## 2016-10-29 ENCOUNTER — Ambulatory Visit (INDEPENDENT_AMBULATORY_CARE_PROVIDER_SITE_OTHER): Payer: Medicare Other | Admitting: Pulmonary Disease

## 2016-10-29 VITALS — BP 124/70 | HR 75 | Ht 60.0 in | Wt 160.0 lb

## 2016-10-29 DIAGNOSIS — R0602 Shortness of breath: Secondary | ICD-10-CM | POA: Diagnosis not present

## 2016-10-29 DIAGNOSIS — J441 Chronic obstructive pulmonary disease with (acute) exacerbation: Secondary | ICD-10-CM | POA: Diagnosis not present

## 2016-10-29 DIAGNOSIS — Z8639 Personal history of other endocrine, nutritional and metabolic disease: Secondary | ICD-10-CM | POA: Diagnosis not present

## 2016-10-29 DIAGNOSIS — J9610 Chronic respiratory failure, unspecified whether with hypoxia or hypercapnia: Secondary | ICD-10-CM

## 2016-10-29 NOTE — Patient Instructions (Addendum)
You do qualify for oxygen.  Lung function appears mildly decreased Continue to use albuterol 2-3 times a day as needed Okay to stay on Symbicort 2 puffs twice daily

## 2016-10-29 NOTE — Progress Notes (Signed)
Subjective:    Patient ID: [name], female    DOB: Jan 26, 1925, 83 y.o.   MRN: [id number]  HPI  Chief Complaint  Patient presents with  . Pulm Consult    Has a history of COPD. Was placed on O2 last year and has not been monitored since then.     83 year old remote smoker referred for evaluation of ongoing need for oxygen. She was placed on oxygen about a year ago after colon surgery.  This is helped her subjectively.  She recently saw her cardiologist who felt that she needs pulmonary referral for follow-up of her oxygen needs.  She sees cardiology for follow-up of her chronic diastolic heart failure and paroxysmal atrial fibrillation on Eliquis. 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  She had emergent surgery for perforated colon in 2014 and required transient colostomy which was since reversed.   She is accompanied by her daughter today She is compliant with oxygen .  She is dyspneic even after walking a few steps on level ground.  She reports cough productive of minimal clear sputum which she reports due to congestion in her chest and excessive nasal and sinus drainage.  Albuterol nebs seem to help her breathing and open up her chest.  She was given a trial of Symbicort which did not help her breathing much and she has stopped using this. Oxygen saturation today was 93% at rest but dropped to 87% on ambulation 1 l and improved with 2 L of oxygen   Significant tests/ events reviewed  Chest x-ray 03/2016 patchy bilateral airspace disease  CT Angiogram he did qualify for oxygen.  Lung function 02/2014 showed multifocal bilateral airspace disease consistent with pneumonia  CT neck 10/2016 shows chronic massive thyroid goiter with multiple calcified nodules.  Mass noted in the right upper lobe which has increased compared to 2016 And noted to be hypermetabolic on PET/CT in 3825  Spirometry  10/2016 showed no history of 60, FEV1 of 73% and FVC of 94% consistent with mild airway obstruction   Past Medical History:  Diagnosis Date  . Anemia    years ago  . Arthritis    "right shoulder" (01/02/2012)  . Bladder mass   . CAP (community acquired pneumonia) 01/02/2012   Lower lobe   . CHF (congestive heart failure) ([place])   . COPD (chronic obstructive pulmonary disease) ([place])   . Difficulty sleeping   . Diverticulitis of colon with perforation 01/14/2012   That is post sigmoid resection with [facility] pouch and end colostomy   . GERD (gastroesophageal reflux disease)    occasional  . Goiter    radioactive iodine ablation/notes 01/02/2012  . History of kidney stones    X 1  . Hypertension   . Hypothyroidism    "I have taken Synthroid before" (01/02/2012)   HAS HAD RADIOACTIVE IODINE TX 2 YR S AGO  . Intra-abdominal abscess ([place]) 01/14/2012  . PAF (paroxysmal atrial fibrillation) ([place])   . Pneumatosis of intestines 01/14/2012  . Pneumonia 01/02/2012; ? 2009  . Radiation 08/10/13-09/15/13   57 gray to vaginal/bladder mass  . SOB (shortness of breath)    'all the time" (01/02/2012)  . Stroke ([place])   . UTI (lower urinary tract infection) 01/03/2012    Past Surgical History:  Procedure Laterality Date  . ABDOMINAL HYSTERECTOMY  1980's  . APPENDECTOMY  1980's   "when they did hysterectomy" (01/02/2012)  .  CATARACT EXTRACTION W/ INTRAOCULAR LENS  IMPLANT, BILATERAL  ?1990's   Bil  . COLON SURGERY    . COLOSTOMY N/A 03/12/2012   Procedure: COLOSTOMY;  Surgeon: Ralene Ok, MD;  Location: [place];  Service: General;  Laterality: N/A;  . COLOSTOMY REVISION N/A 03/12/2012   Procedure: COLON RESECTION SIGMOID ;  Surgeon: Ralene Ok, MD;  Location: [place];  Service: General;  Laterality: N/A;  . COLOSTOMY TAKEDOWN N/A 10/21/2012   Procedure: LAPAROSCOPIC COLOSTOMY TAKEDOWN AND HARTMANS ANASTOMOSIS, rigid proctoscopy;  Surgeon: Ralene Ok, MD;  [facility];  Service: General;   Laterality: N/A;  . EYE SURGERY    . LYSIS OF ADHESION N/A 10/21/2012   Procedure: LYSIS OF ADHESION;  Surgeon: Ralene Ok, MD;  [facility];  Service: General;  Laterality: N/A;  . TRANSURETHRAL RESECTION OF BLADDER TUMOR WITH GYRUS (TURBT-GYRUS) N/A 06/30/2013   Procedure: TRANSURETHRAL RESECTION OF BLADDER TUMOR WITH GYRUS (TURBT-GYRUS)/VAGINAL BIOPSY;  Surgeon: Ardis Hughs, MD;  [facility];  Service: Urology;  Laterality: N/A;     Review of Systems Positive for shortness of breath with activity, difficulty swallowing especially solids, dry cough Ambulates with a walker  Constitutional: negative for anorexia, fevers and sweats  Eyes: negative for irritation, redness and visual disturbance  Ears, nose, mouth, throat, and face: negative for earaches, epistaxis, nasal congestion and sore throat  Respiratory: negative for cough, dyspnea on exertion, sputum and wheezing  Cardiovascular: negative for chest pain,lower extremity edema, orthopnea, palpitations and syncope  Gastrointestinal: negative for abdominal pain, constipation, diarrhea, melena, nausea and vomiting  Genitourinary:negative for dysuria, frequency and hematuria  Hematologic/lymphatic: negative for bleeding, easy bruising and lymphadenopathy  Musculoskeletal:negative for arthralgias, muscle weakness and stiff joints  Neurological: negative for coordination problems, gait problems, headaches and weakness  Endocrine: negative for diabetic symptoms including polydipsia, polyuria and weight loss     Objective:   Physical Exam  Gen. Pleasant, obese, in no distress ENT - large goiter wrapping around neck, no post nasal drip Neck: No JVD, no thyromegaly, no carotid bruits, no stridor on panting Lungs: no use of accessory muscles, no dullness to percussion, decreased without rales or rhonchi  Cardiovascular: Rhythm regular, heart sounds  normal, no murmurs or gallops, no peripheral edema Musculoskeletal: No  deformities, no cyanosis or clubbing , no tremors       Assessment & Plan:

## 2016-10-29 NOTE — Assessment & Plan Note (Signed)
She does seem to have hypoxia on exertion which does not correlate with degree of airway obstruction .  She does not appear to be fluid overloaded today.  There is no evidence of interstitial lung disease or pulmonary hypertension. Again I think this is related to chronic upper airway obstruction in some manner

## 2016-10-29 NOTE — Assessment & Plan Note (Signed)
Although spirometry shows mild airway obstruction, I do not think that this is   COPD and the classical sense, she seems to have smoked less than 10 pack years .  I think it is more likely that this is related to her large goiter and upper airway obstruction all the spirometry does not show the classical Box pattern that I would expect with this  Continue to use albuterol 2-3 times a day as needed Okay to stay on Symbicort 2 puffs twice daily

## 2016-10-29 NOTE — Assessment & Plan Note (Signed)
I do not think that she is a candidate for surgery for this large goiter with intrathoracic/substernal extension

## 2016-11-27 ENCOUNTER — Other Ambulatory Visit: Payer: Self-pay

## 2016-11-27 ENCOUNTER — Emergency Department (HOSPITAL_COMMUNITY): Payer: Medicare Other

## 2016-11-27 ENCOUNTER — Encounter (HOSPITAL_COMMUNITY): Payer: Self-pay | Admitting: Emergency Medicine

## 2016-11-27 DIAGNOSIS — E039 Hypothyroidism, unspecified: Secondary | ICD-10-CM | POA: Diagnosis not present

## 2016-11-27 DIAGNOSIS — I5022 Chronic systolic (congestive) heart failure: Secondary | ICD-10-CM | POA: Insufficient documentation

## 2016-11-27 DIAGNOSIS — Z79899 Other long term (current) drug therapy: Secondary | ICD-10-CM | POA: Diagnosis not present

## 2016-11-27 DIAGNOSIS — Z85828 Personal history of other malignant neoplasm of skin: Secondary | ICD-10-CM | POA: Insufficient documentation

## 2016-11-27 DIAGNOSIS — Z7901 Long term (current) use of anticoagulants: Secondary | ICD-10-CM | POA: Insufficient documentation

## 2016-11-27 DIAGNOSIS — J449 Chronic obstructive pulmonary disease, unspecified: Secondary | ICD-10-CM | POA: Diagnosis not present

## 2016-11-27 DIAGNOSIS — J4541 Moderate persistent asthma with (acute) exacerbation: Secondary | ICD-10-CM | POA: Diagnosis not present

## 2016-11-27 DIAGNOSIS — Z87891 Personal history of nicotine dependence: Secondary | ICD-10-CM | POA: Diagnosis not present

## 2016-11-27 DIAGNOSIS — R0981 Nasal congestion: Secondary | ICD-10-CM | POA: Diagnosis not present

## 2016-11-27 DIAGNOSIS — R0602 Shortness of breath: Secondary | ICD-10-CM | POA: Diagnosis present

## 2016-11-27 DIAGNOSIS — I110 Hypertensive heart disease with heart failure: Secondary | ICD-10-CM | POA: Diagnosis not present

## 2016-11-27 DIAGNOSIS — Z8673 Personal history of transient ischemic attack (TIA), and cerebral infarction without residual deficits: Secondary | ICD-10-CM | POA: Diagnosis not present

## 2016-11-27 LAB — BASIC METABOLIC PANEL
ANION GAP: 5 (ref 5–15)
BUN: 18 mg/dL (ref 6–20)
CALCIUM: 9 mg/dL (ref 8.9–10.3)
CO2: 31 mmol/L (ref 22–32)
Chloride: 105 mmol/L (ref 101–111)
Creatinine, Ser: 1.02 mg/dL — ABNORMAL HIGH (ref 0.44–1.00)
GFR calc Af Amer: 55 mL/min — ABNORMAL LOW (ref >60)
GFR, EST NON AFRICAN AMERICAN: 47 mL/min — AB (ref >60)
Glucose, Bld: 120 mg/dL — ABNORMAL HIGH (ref 65–99)
POTASSIUM: 4.2 mmol/L (ref 3.5–5.1)
SODIUM: 141 mmol/L (ref 135–145)

## 2016-11-27 LAB — CBC
HCT: 30.2 % — ABNORMAL LOW (ref 36.0–46.0)
Hemoglobin: 10.5 g/dL — ABNORMAL LOW (ref 12.0–15.0)
MCH: 35.6 pg — AB (ref 26.0–34.0)
MCHC: 34.8 g/dL (ref 30.0–36.0)
MCV: 102.4 fL — ABNORMAL HIGH (ref 78.0–100.0)
PLATELETS: 249 10*3/uL (ref 150–400)
RBC: 2.95 MIL/uL — AB (ref 3.87–5.11)
RDW: 12.6 % (ref 11.5–15.5)
WBC: 4.5 10*3/uL (ref 4.0–10.5)

## 2016-11-27 MED ORDER — ALBUTEROL SULFATE (2.5 MG/3ML) 0.083% IN NEBU
5.0000 mg | INHALATION_SOLUTION | Freq: Once | RESPIRATORY_TRACT | Status: AC
  Administered 2016-11-27: 5 mg via RESPIRATORY_TRACT
  Filled 2016-11-27: qty 6

## 2016-11-27 NOTE — ED Notes (Signed)
Second call in lobby for vitals update. No response. 

## 2016-11-27 NOTE — ED Triage Notes (Signed)
Reports having head and chest congestion for about a week.  Also c/o sob.  Wears oxygen at home at 2L.  Denies having any chest pain.

## 2016-11-27 NOTE — ED Notes (Signed)
Pt. Called to recheck vitals. No answer.

## 2016-11-27 NOTE — ED Notes (Signed)
Pt is in A hallway

## 2016-11-27 NOTE — ED Notes (Signed)
Third call in lobby. No response.

## 2016-11-28 ENCOUNTER — Emergency Department (HOSPITAL_COMMUNITY)
Admission: EM | Admit: 2016-11-28 | Discharge: 2016-11-28 | Disposition: A | Discharge status: Home / Self Care | Payer: Medicare Other | Attending: Emergency Medicine | Admitting: Emergency Medicine

## 2016-11-28 DIAGNOSIS — J4541 Moderate persistent asthma with (acute) exacerbation: Secondary | ICD-10-CM

## 2016-11-28 MED ORDER — PREDNISONE 20 MG PO TABS
60.0000 mg | ORAL_TABLET | Freq: Once | ORAL | Status: AC
  Administered 2016-11-28: 60 mg via ORAL
  Filled 2016-11-28: qty 3

## 2016-11-28 MED ORDER — ALBUTEROL SULFATE (2.5 MG/3ML) 0.083% IN NEBU
5.0000 mg | INHALATION_SOLUTION | Freq: Once | RESPIRATORY_TRACT | Status: AC
  Administered 2016-11-28: 5 mg via RESPIRATORY_TRACT
  Filled 2016-11-28: qty 6

## 2016-11-28 MED ORDER — AZITHROMYCIN 250 MG PO TABS
500.0000 mg | ORAL_TABLET | Freq: Once | ORAL | Status: AC
  Administered 2016-11-28: 500 mg via ORAL
  Filled 2016-11-28: qty 2

## 2016-11-28 MED ORDER — AZITHROMYCIN 250 MG PO TABS: 250.0000 mg | ORAL_TABLET | Freq: Every day | ORAL | 0 refills | Status: AC

## 2016-11-28 MED ORDER — PREDNISONE 20 MG PO TABS: 40.0000 mg | ORAL_TABLET | Freq: Every day | ORAL | 0 refills | Status: AC

## 2016-11-28 NOTE — ED Notes (Signed)
PT states understanding of care given, follow up care, and medication prescribed. PT ambulated from ED to car with a steady gait. 

## 2016-11-28 NOTE — ED Provider Notes (Signed)
[name] EMERGENCY DEPARTMENT Provider Note   CSN: [id number] Arrival date & time: 11/27/16  2007     History   Chief Complaint Chief Complaint  Patient presents with  . Shortness of Breath  . Nasal Congestion    HPI [name] is a 83 y.o. female.  HPI Patient is an 83 year old female with a history of reactive airway disease.  She is seen pulmonary before and pulmonary believes some of her shortness of breath may be secondary to upper airway compression.  She denies fevers and chills.  Reports mild productive cough.  She reports some improvement with her bronchodilators today.  She is given a breathing treatment prior to my evaluation states she feels much better at this time.  No unilateral leg swelling.  No history of DVT or pulmonary embolism.  Symptoms are moderate in severity.  Now she is stating that she feels much better.  She does report that she has had some response to steroids before in the past.   Past Medical History:  Diagnosis Date  . Anemia    years ago  . Arthritis    "right shoulder" (01/02/2012)  . Bladder mass   . CAP (community acquired pneumonia) 01/02/2012   Lower lobe   . CHF (congestive heart failure) ([place])   . COPD (chronic obstructive pulmonary disease) ([place])   . Difficulty sleeping   . Diverticulitis of colon with perforation 01/14/2012   That is post sigmoid resection with [facility] pouch and end colostomy   . GERD (gastroesophageal reflux disease)    occasional  . Goiter    radioactive iodine ablation/notes 01/02/2012  . History of kidney stones    X 1  . Hypertension   . Hypothyroidism    "I have taken Synthroid before" (01/02/2012)   HAS HAD RADIOACTIVE IODINE TX 2 YR S AGO  . Intra-abdominal abscess ([place]) 01/14/2012  . PAF (paroxysmal atrial fibrillation) ([place])   . Pneumatosis of intestines 01/14/2012  . Pneumonia 01/02/2012; ? 2009  . Radiation 08/10/13-09/15/13   55 gray to vaginal/bladder mass  . SOB (shortness of  breath)    'all the time" (01/02/2012)  . Stroke ([place])   . UTI (lower urinary tract infection) 01/03/2012    Patient Active Problem List   Diagnosis Date Noted  . Gait abnormality 05/18/2016  . History of squamous cell carcinoma 03/04/2016  . Chest pain 12/28/2015  . Chronic respiratory failure, unsp w hypoxia or hypercapnia (HCC) 12/15/2015  . PAF (paroxysmal atrial fibrillation) ([place]) 08/27/2015  . Essential hypertension 06/15/2015  . Squamous cell carcinoma, metastatic ([place]) 06/15/2015  . Acute CVA (cerebrovascular accident) ([place]) 04/13/2015  . Cerebrovascular accident (CVA) due to embolism of left middle cerebral artery ([place])   . HLD (hyperlipidemia)   . Cerebral thrombosis with cerebral infarction 04/12/2015  . Stroke-like symptoms   . Dysarthria 04/11/2015  . Absolute anemia 09/04/2014  . Elevated troponin 02/14/2014  . Dyspnea   . Overweight (BMI 25.0-29.9) 02/13/2014  . Goiter 02/13/2014  . Chronic systolic CHF (congestive heart failure) ([place]) 02/13/2014  . Squamous cell carcinoma of unknown origin 07/24/2013  . S/P colostomy takedown 10/21/2012 10/23/2012  . Diverticulosis of colon 03/07/2012  . Pulmonary nodules 01/23/2012  . Hematuria, microscopic 01/14/2012  . Asymptomatic PVCs 01/03/2012  . H/O: hypothyroidism 01/03/2012  . COPD exacerbation ([place]) 01/02/2012  . Hypertension 01/02/2012  . Chronic combined systolic and diastolic CHF (congestive heart failure) ([place]) 01/02/2012  . Hx of goiter 01/02/2012    Past  Surgical History:  Procedure Laterality Date  . ABDOMINAL HYSTERECTOMY  1980's  . APPENDECTOMY  1980's   "when they did hysterectomy" (01/02/2012)  . CATARACT EXTRACTION W/ INTRAOCULAR LENS  IMPLANT, BILATERAL  ?1990's   [place]  . COLON SURGERY    . COLOSTOMY N/A 03/12/2012   Procedure: COLOSTOMY;  Surgeon: Ralene Ok, MD;  Location: [facility];  Service: General;  Laterality: N/A;  . COLOSTOMY REVISION N/A 03/12/2012   Procedure: COLON RESECTION SIGMOID ;   Surgeon: Ralene Ok, MD;  Location: [facility];  Service: General;  Laterality: N/A;  . COLOSTOMY TAKEDOWN N/A 10/21/2012   Procedure: LAPAROSCOPIC COLOSTOMY TAKEDOWN AND HARTMANS ANASTOMOSIS, rigid proctoscopy;  Surgeon: Ralene Ok, MD;  Location: [facility];  Service: General;  Laterality: N/A;  . EYE SURGERY    . LYSIS OF ADHESION N/A 10/21/2012   Procedure: LYSIS OF ADHESION;  Surgeon: Ralene Ok, MD;  Location: [facility];  Service: General;  Laterality: N/A;  . TRANSURETHRAL RESECTION OF BLADDER TUMOR WITH GYRUS (TURBT-GYRUS) N/A 06/30/2013   Procedure: TRANSURETHRAL RESECTION OF BLADDER TUMOR WITH GYRUS (TURBT-GYRUS)/VAGINAL BIOPSY;  Surgeon: Ardis Hughs, MD;  Location: [facility];  Service: Urology;  Laterality: N/A;    OB History    No data available       Home Medications    Prior to Admission medications   Medication Sig Start Date End Date Taking? Authorizing Provider  acetaminophen (TYLENOL) 500 MG tablet Take 1,000 mg by mouth every 6 (six) hours as needed for mild pain. Reported on 07/14/2015   Yes [provider]  albuterol (PROVENTIL HFA;VENTOLIN HFA) 108 (90 BASE) MCG/ACT inhaler Inhale 2 puffs into the lungs every 6 (six) hours as needed for wheezing.   Yes [provider]  albuterol (PROVENTIL) (2.5 MG/3ML) 0.083% nebulizer solution Take 3 mLs (2.5 mg total) by nebulization every 6 (six) hours as needed for wheezing or shortness of breath. 12/17/15  Yes Elgergawy, Silver Huguenin, MD  apixaban (ELIQUIS) 5 MG TABS tablet Take 1 tablet (5 mg total) by mouth 2 (two) times daily. 08/28/15  Yes Ghimire, Henreitta Leber, MD  cholecalciferol (VITAMIN D) 1000 units tablet Take 1,000 Units by mouth daily.   Yes [provider]  diphenhydramine-acetaminophen (TYLENOL PM) 25-500 MG TABS tablet Take 1 tablet by mouth at bedtime as needed (sleep).    Yes [provider]  ferrous sulfate 325 (65 FE) MG tablet Take 325 mg by mouth daily with breakfast.    Yes  [provider]  furosemide (LASIX) 40 MG tablet Take 1 tablet (40 mg total) by mouth daily. 12/18/15  Yes Elgergawy, Silver Huguenin, MD  lisinopril (PRINIVIL,ZESTRIL) 20 MG tablet Take 20 mg by mouth daily.  03/16/16  Yes [provider]  omeprazole (PRILOSEC) 20 MG capsule Take 20 mg by mouth 2 (two) times daily.   Yes [provider]  polyethylene glycol (MIRALAX / GLYCOLAX) packet Take 17 g by mouth daily.   Yes [provider]  polyvinyl alcohol (LIQUIFILM TEARS) 1.4 % ophthalmic solution Place 1 drop into both eyes as needed for dry eyes.   Yes [provider]  SYMBICORT 160-4.5 MCG/ACT inhaler Inhale 2 puffs into the lungs 2 (two) times daily as needed (SOB).  01/12/16  Yes [provider]  azithromycin (ZITHROMAX Z-PAK) 250 MG tablet Take 1 tablet (250 mg total) by mouth daily for 4 days. 11/28/16 12/02/16  Jola Schmidt, MD  predniSONE (DELTASONE) 20 MG tablet Take 2 tablets (40 mg total) by mouth  daily for 5 days. 11/28/16 12/03/16  Jola Schmidt, MD    Family History Family History  Problem Relation Age of Onset  . Cancer Sister        Breast  . Stroke Sister   . Heart disease Mother   . Heart disease Unknown     Social History Social History   Tobacco Use  . Smoking status: Former Smoker    Packs/day: 0.12    Years: 35.00    Pack years: 4.20    Types: Cigarettes    Last attempt to quit: 01/02/1991    Years since quitting: 25.9  . Smokeless tobacco: Never Used  Substance Use Topics  . Alcohol use: No  . Drug use: No     Allergies   Indomethacin   Review of Systems Review of Systems  All other systems reviewed and are negative.    Physical Exam Updated Vital Signs BP (!) 153/58   Pulse 68   Temp 98.3 F (36.8 C) (Oral)   Resp (!) 22   Ht 5' (1.524 m)   Wt 71.7 kg (158 lb)   SpO2 98%   BMI 30.86 kg/m   Physical Exam  Constitutional: She is oriented to person, place, and time. She appears well-developed  and well-nourished.  HENT:  Head: Normocephalic.  Eyes: EOM are normal.  Neck: Normal range of motion.  Cardiovascular: Normal rate and regular rhythm.  Pulmonary/Chest: Effort normal. No stridor. No respiratory distress. She has wheezes. She has no rales.  Abdominal: Soft. She exhibits no distension.  Musculoskeletal: Normal range of motion.  Neurological: She is alert and oriented to person, place, and time.  Psychiatric: She has a normal mood and affect.  Nursing note and vitals reviewed.    ED Treatments / Results  Labs (all labs ordered are listed, but only abnormal results are displayed) Labs Reviewed  CBC - Abnormal; Notable for the following components:      Result Value   RBC 2.95 (*)    Hemoglobin 10.5 (*)    HCT 30.2 (*)    MCV 102.4 (*)    MCH 35.6 (*)    All other components within normal limits  BASIC METABOLIC PANEL - Abnormal; Notable for the following components:   Glucose, Bld 120 (*)    Creatinine, Ser 1.02 (*)    GFR calc non Af Amer 47 (*)    GFR calc Af Amer 55 (*)    All other components within normal limits    EKG  EKG Interpretation  Date/Time:  Tuesday November 27 2016 20:19:02 EST Ventricular Rate:  66 PR Interval:  180 QRS Duration: 132 QT Interval:  414 QTC Calculation: 434 R Axis:   3 Text Interpretation:  Normal sinus rhythm Non-specific intra-ventricular conduction block Abnormal QRS-T angle, consider primary T wave abnormality Abnormal ECG No significant change was found Confirmed by Jola Schmidt [phone]) on 11/28/2016 6:56:23 AM       Radiology Dg Chest 2 View  Result Date: 11/27/2016 CLINICAL DATA:  83 y/o F; worsening shortness of breath. History of COPD. EXAM: CHEST  2 VIEW COMPARISON:  03/04/2016 chest radiograph.  02/13/2014 CT chest. FINDINGS: Stable cardiac silhouette given projection and technique. Aortic atherosclerosis with calcification. Stable chronic interstitial changes. No focal consolidation. No pleural effusion  or pneumothorax. Severe osteoarthrosis of the right glenohumeral joint. Calcified thyroid goiter better characterized on prior CT of chest. IMPRESSION: Stable chronic interstitial changes of the lungs. No focal consolidation, effusion, or pneumothorax. Electronically Signed  By: Kristine Garbe M.D.   On: 11/27/2016 21:44    Procedures Procedures (including critical care time)  Medications Ordered in ED Medications  albuterol (PROVENTIL) (2.5 MG/3ML) 0.083% nebulizer solution 5 mg (5 mg Nebulization Given 11/27/16 2027)  albuterol (PROVENTIL) (2.5 MG/3ML) 0.083% nebulizer solution 5 mg (5 mg Nebulization Given 11/28/16 0415)  predniSONE (DELTASONE) tablet 60 mg (60 mg Oral Given 11/28/16 0415)  azithromycin (ZITHROMAX) tablet 500 mg (500 mg Oral Given 11/28/16 0415)     Initial Impression / Assessment and Plan / ED Course  I have reviewed the triage vital signs and the nursing notes.  Pertinent labs & imaging results that were available during my care of the patient were reviewed by me and considered in my medical decision making (see chart for details).     Patient with wheezing on examination.  Additional bronchodilators and steroids given while in the emergency department.  She feels much better at this time.  Workup otherwise without significant abnormality.  We will start the patient on antibiotics for possible developing pneumonia given her productive cough and shortness of breath.  We will treat similar to a COPD exacerbation given her history of what sounds like reactive airway disease and her wheezing on examination.  Primary care and pulmonary follow-up.  Patient understands return to the ER for new or worsening symptoms.  Final Clinical Impressions(s) / ED Diagnoses   Final diagnoses:  Moderate persistent reactive airway disease with acute exacerbation    ED Discharge Orders        Ordered    azithromycin (ZITHROMAX Z-PAK) 250 MG tablet  Daily     11/28/16 0632      predniSONE (DELTASONE) 20 MG tablet  Daily     11/28/16 7939       Jola Schmidt, MD 11/28/16 [phone]

## 2017-01-21 ENCOUNTER — Other Ambulatory Visit: Payer: Self-pay

## 2017-01-21 ENCOUNTER — Ambulatory Visit
Admission: RE | Admit: 2017-01-21 | Discharge: 2017-01-21 | Disposition: A | Discharge status: Home / Self Care | Payer: Medicare Other | Source: Ambulatory Visit | Attending: Radiation Oncology | Admitting: Radiation Oncology

## 2017-01-21 ENCOUNTER — Encounter: Payer: Self-pay | Admitting: Radiation Oncology

## 2017-01-21 VITALS — BP 133/72 | HR 65 | Temp 98.0°F | Ht 60.0 in | Wt 157.6 lb

## 2017-01-21 DIAGNOSIS — Z8541 Personal history of malignant neoplasm of cervix uteri: Secondary | ICD-10-CM | POA: Diagnosis not present

## 2017-01-21 DIAGNOSIS — Z8551 Personal history of malignant neoplasm of bladder: Secondary | ICD-10-CM | POA: Insufficient documentation

## 2017-01-21 DIAGNOSIS — C799 Secondary malignant neoplasm of unspecified site: Secondary | ICD-10-CM

## 2017-01-21 DIAGNOSIS — Z79899 Other long term (current) drug therapy: Secondary | ICD-10-CM | POA: Insufficient documentation

## 2017-01-21 DIAGNOSIS — Z08 Encounter for follow-up examination after completed treatment for malignant neoplasm: Secondary | ICD-10-CM | POA: Diagnosis present

## 2017-01-21 DIAGNOSIS — C4492 Squamous cell carcinoma of skin, unspecified: Secondary | ICD-10-CM

## 2017-01-21 DIAGNOSIS — IMO0002 Reserved for concepts with insufficient information to code with codable children: Secondary | ICD-10-CM

## 2017-01-21 DIAGNOSIS — E079 Disorder of thyroid, unspecified: Secondary | ICD-10-CM | POA: Insufficient documentation

## 2017-01-21 NOTE — Progress Notes (Addendum)
[name] is here for follow up.  She denies having any pain. She reports having shortness of breath especially with activity.  She denies having any urinary/bowel issues or vaginal bleeding.  She reports having fatigue with activity.  She is not using a vaginal dilator.  BP 133/72 (BP Location: Left Arm, Patient Position: Sitting)   Pulse 65   Temp 98 F (36.7 C) (Oral)   Ht 5' (1.524 m)   Wt 157 lb 9.6 oz (71.5 kg)   SpO2 98%   BMI 30.78 kg/m    Wt Readings from Last 3 Encounters:  01/21/17 157 lb 9.6 oz (71.5 kg)  11/27/16 158 lb (71.7 kg)  10/29/16 160 lb (72.6 kg)

## 2017-01-21 NOTE — Progress Notes (Signed)
Radiation Oncology         [phone] ________________________________  Name: [name] MRN: [id number]  Date: 01/21/2017  DOB: Oct 19, 1925  Follow-Up Visit Note  CC: Leonard Downing, MD  Everitt Amber, MD    ICD-10-CM   1. Squamous cell carcinoma, metastatic (HCC) C79.9     Diagnosis:   84 y.o. female with squamous cell carcinoma of unknown origin presenting in the bladder and upper vaginal area  Interval Since Last Radiation:  3 years 4 months  08/10/2013 - 09/15/2013: 55 Gy in 25 fractions (2.2 Gy per fraction) treatment was directed at the vaginal/bladder mass  Narrative:  The patient returns today for routine follow-up. She denies having any pain. She reports having fatigue and shortness of breath especially with activity. She is not on oxygen today. She denies having any urinary/bowel issues or vaginal bleeding. She is not using a vaginal dilator.  Since her last visit, she has been followed up for enlargement of her thyroid with a CT scan done on 10/22/2016. This showed the chronic massive thyroid goiter and mild interval enlargement of a 6 cm right upper thyroid mass with stable to slightly decreased size of the thyroid elsewhere. She reports taking medication for this.            ALLERGIES:  is allergic to indomethacin.  Meds: Current Outpatient Medications  Medication Sig Dispense Refill  . albuterol (PROVENTIL HFA;VENTOLIN HFA) 108 (90 BASE) MCG/ACT inhaler Inhale 2 puffs into the lungs every 6 (six) hours as needed for wheezing.    Marland Kitchen albuterol (PROVENTIL) (2.5 MG/3ML) 0.083% nebulizer solution Take 3 mLs (2.5 mg total) by nebulization every 6 (six) hours as needed for wheezing or shortness of breath. 75 mL 12  . apixaban (ELIQUIS) 5 MG TABS tablet Take 1 tablet (5 mg total) by mouth 2 (two) times daily. 120 tablet 0  . cholecalciferol (VITAMIN D) 1000 units tablet Take 1,000 Units by mouth daily.    . ferrous sulfate 325 (65 FE) MG tablet Take 325 mg by mouth  daily with breakfast.     . furosemide (LASIX) 40 MG tablet Take 1 tablet (40 mg total) by mouth daily. 30 tablet 0  . lisinopril (PRINIVIL,ZESTRIL) 20 MG tablet Take 20 mg by mouth daily.     Marland Kitchen omeprazole (PRILOSEC) 20 MG capsule Take 20 mg by mouth 2 (two) times daily.    . polyethylene glycol (MIRALAX / GLYCOLAX) packet Take 17 g by mouth daily.    . polyvinyl alcohol (LIQUIFILM TEARS) 1.4 % ophthalmic solution Place 1 drop into both eyes as needed for dry eyes.    Marland Kitchen acetaminophen (TYLENOL) 500 MG tablet Take 1,000 mg by mouth every 6 (six) hours as needed for mild pain. Reported on 07/14/2015    . diphenhydramine-acetaminophen (TYLENOL PM) 25-500 MG TABS tablet Take 1 tablet by mouth at bedtime as needed (sleep).     . SYMBICORT 160-4.5 MCG/ACT inhaler Inhale 2 puffs into the lungs 2 (two) times daily as needed (SOB).      No current facility-administered medications for this encounter.     Physical Findings: The patient is in no acute distress. Patient is alert and oriented.  height is 5' (1.524 m) and weight is 157 lb 9.6 oz (71.5 kg). Her oral temperature is 98 F (36.7 C). Her blood pressure is 133/72 and her pulse is 65. Her oxygen saturation is 98%.   Lungs are clear to auscultation bilaterally. Heart has regular rate and rhythm.  No palpable cervical, supraclavicular, or axillary adenopathy. Significant swelling of her right thyroid gland,  Abdomen soft, non-tender, with normal bowel sounds.  On pelvic examination the external genitalia were unremarkable. A speculum exam was performed. There are no mucosal lesions noted in the vaginal vault. On bimanual and rectovaginal examination there were no pelvic masses appreciated.  Lab Findings: Lab Results  Component Value Date   WBC 4.5 11/27/2016   HGB 10.5 (L) 11/27/2016   HCT 30.2 (L) 11/27/2016   MCV 102.4 (H) 11/27/2016   PLT 249 11/27/2016    Radiographic Findings: No results found.  Impression:  No evidence of disease  recurrence on clinical exam.   Plan:  Return for routine follow-up in 6 months.   ____________________________________  Blair Promise, PhD, MD  This document serves as a record of services personally performed by Gery Pray, MD. It was created on his behalf by Rae Lips, a trained medical scribe. The creation of this record is based on the scribe's personal observations and the provider's statements to them. This document has been checked and approved by the attending provider.

## 2017-01-29 ENCOUNTER — Ambulatory Visit: Payer: Medicare Other | Admitting: Pulmonary Disease

## 2017-04-27 ENCOUNTER — Inpatient Hospital Stay (HOSPITAL_COMMUNITY)
Admission: EM | Admit: 2017-04-27 | Discharge: 2017-05-01 | DRG: 871 | Disposition: A | Discharge status: Home Health | Payer: Medicare Other | Attending: Internal Medicine | Admitting: Internal Medicine

## 2017-04-27 ENCOUNTER — Encounter (HOSPITAL_COMMUNITY): Payer: Self-pay | Admitting: Emergency Medicine

## 2017-04-27 ENCOUNTER — Emergency Department (HOSPITAL_COMMUNITY): Payer: Medicare Other

## 2017-04-27 DIAGNOSIS — I5042 Chronic combined systolic (congestive) and diastolic (congestive) heart failure: Secondary | ICD-10-CM

## 2017-04-27 DIAGNOSIS — K56609 Unspecified intestinal obstruction, unspecified as to partial versus complete obstruction: Secondary | ICD-10-CM

## 2017-04-27 DIAGNOSIS — K439 Ventral hernia without obstruction or gangrene: Secondary | ICD-10-CM | POA: Diagnosis present

## 2017-04-27 DIAGNOSIS — J440 Chronic obstructive pulmonary disease with acute lower respiratory infection: Secondary | ICD-10-CM | POA: Diagnosis present

## 2017-04-27 DIAGNOSIS — E039 Hypothyroidism, unspecified: Secondary | ICD-10-CM | POA: Diagnosis present

## 2017-04-27 DIAGNOSIS — J449 Chronic obstructive pulmonary disease, unspecified: Secondary | ICD-10-CM

## 2017-04-27 DIAGNOSIS — J181 Lobar pneumonia, unspecified organism: Secondary | ICD-10-CM | POA: Diagnosis present

## 2017-04-27 DIAGNOSIS — Z8249 Family history of ischemic heart disease and other diseases of the circulatory system: Secondary | ICD-10-CM | POA: Diagnosis not present

## 2017-04-27 DIAGNOSIS — J439 Emphysema, unspecified: Secondary | ICD-10-CM

## 2017-04-27 DIAGNOSIS — A419 Sepsis, unspecified organism: Principal | ICD-10-CM | POA: Diagnosis present

## 2017-04-27 DIAGNOSIS — I493 Ventricular premature depolarization: Secondary | ICD-10-CM | POA: Diagnosis present

## 2017-04-27 DIAGNOSIS — R7989 Other specified abnormal findings of blood chemistry: Secondary | ICD-10-CM

## 2017-04-27 DIAGNOSIS — R6520 Severe sepsis without septic shock: Secondary | ICD-10-CM | POA: Diagnosis present

## 2017-04-27 DIAGNOSIS — Z803 Family history of malignant neoplasm of breast: Secondary | ICD-10-CM

## 2017-04-27 DIAGNOSIS — Z823 Family history of stroke: Secondary | ICD-10-CM

## 2017-04-27 DIAGNOSIS — R1032 Left lower quadrant pain: Secondary | ICD-10-CM

## 2017-04-27 DIAGNOSIS — R0902 Hypoxemia: Secondary | ICD-10-CM

## 2017-04-27 DIAGNOSIS — I10 Essential (primary) hypertension: Secondary | ICD-10-CM | POA: Diagnosis present

## 2017-04-27 DIAGNOSIS — I480 Paroxysmal atrial fibrillation: Secondary | ICD-10-CM | POA: Diagnosis present

## 2017-04-27 DIAGNOSIS — Z87891 Personal history of nicotine dependence: Secondary | ICD-10-CM

## 2017-04-27 DIAGNOSIS — Z8639 Personal history of other endocrine, nutritional and metabolic disease: Secondary | ICD-10-CM

## 2017-04-27 DIAGNOSIS — I5043 Acute on chronic combined systolic (congestive) and diastolic (congestive) heart failure: Secondary | ICD-10-CM | POA: Diagnosis present

## 2017-04-27 DIAGNOSIS — Z9981 Dependence on supplemental oxygen: Secondary | ICD-10-CM

## 2017-04-27 DIAGNOSIS — I340 Nonrheumatic mitral (valve) insufficiency: Secondary | ICD-10-CM | POA: Diagnosis not present

## 2017-04-27 DIAGNOSIS — Z66 Do not resuscitate: Secondary | ICD-10-CM | POA: Diagnosis present

## 2017-04-27 DIAGNOSIS — K219 Gastro-esophageal reflux disease without esophagitis: Secondary | ICD-10-CM | POA: Diagnosis present

## 2017-04-27 DIAGNOSIS — Z7901 Long term (current) use of anticoagulants: Secondary | ICD-10-CM

## 2017-04-27 DIAGNOSIS — I248 Other forms of acute ischemic heart disease: Secondary | ICD-10-CM | POA: Diagnosis present

## 2017-04-27 DIAGNOSIS — Z8673 Personal history of transient ischemic attack (TIA), and cerebral infarction without residual deficits: Secondary | ICD-10-CM

## 2017-04-27 DIAGNOSIS — J9621 Acute and chronic respiratory failure with hypoxia: Secondary | ICD-10-CM | POA: Diagnosis present

## 2017-04-27 DIAGNOSIS — N281 Cyst of kidney, acquired: Secondary | ICD-10-CM | POA: Diagnosis present

## 2017-04-27 DIAGNOSIS — E785 Hyperlipidemia, unspecified: Secondary | ICD-10-CM | POA: Diagnosis present

## 2017-04-27 DIAGNOSIS — I110 Hypertensive heart disease with heart failure: Secondary | ICD-10-CM | POA: Diagnosis present

## 2017-04-27 DIAGNOSIS — J189 Pneumonia, unspecified organism: Secondary | ICD-10-CM

## 2017-04-27 DIAGNOSIS — N179 Acute kidney failure, unspecified: Secondary | ICD-10-CM | POA: Diagnosis present

## 2017-04-27 LAB — COMPREHENSIVE METABOLIC PANEL
ALK PHOS: 61 U/L (ref 38–126)
ALT: 11 U/L — AB (ref 14–54)
AST: 17 U/L (ref 15–41)
Albumin: 3.6 g/dL (ref 3.5–5.0)
Anion gap: 9 (ref 5–15)
BUN: 16 mg/dL (ref 6–20)
CALCIUM: 8.9 mg/dL (ref 8.9–10.3)
CO2: 30 mmol/L (ref 22–32)
CREATININE: 0.86 mg/dL (ref 0.44–1.00)
Chloride: 98 mmol/L — ABNORMAL LOW (ref 101–111)
GFR calc Af Amer: >60 mL/min (ref >60)
GFR, EST NON AFRICAN AMERICAN: 58 mL/min — AB (ref >60)
Glucose, Bld: 118 mg/dL — ABNORMAL HIGH (ref 65–99)
Potassium: 4.1 mmol/L (ref 3.5–5.1)
Sodium: 137 mmol/L (ref 135–145)
Total Bilirubin: 1.9 mg/dL — ABNORMAL HIGH (ref 0.3–1.2)
Total Protein: 6.7 g/dL (ref 6.5–8.1)

## 2017-04-27 LAB — CBC
HCT: 38.3 % (ref 36.0–46.0)
Hemoglobin: 12.5 g/dL (ref 12.0–15.0)
MCH: 31 pg (ref 26.0–34.0)
MCHC: 32.6 g/dL (ref 30.0–36.0)
MCV: 95 fL (ref 78.0–100.0)
PLATELETS: 224 10*3/uL (ref 150–400)
RBC: 4.03 MIL/uL (ref 3.87–5.11)
RDW: 12.3 % (ref 11.5–15.5)
WBC: 17.4 10*3/uL — AB (ref 4.0–10.5)

## 2017-04-27 LAB — LIPASE, BLOOD: Lipase: 26 U/L (ref 11–51)

## 2017-04-27 LAB — I-STAT CG4 LACTIC ACID, ED
LACTIC ACID, VENOUS: 1.27 mmol/L (ref 0.5–1.9)
Lactic Acid, Venous: 1.51 mmol/L (ref 0.5–1.9)

## 2017-04-27 LAB — I-STAT TROPONIN, ED: Troponin i, poc: 0.06 ng/mL (ref 0.00–0.08)

## 2017-04-27 LAB — BRAIN NATRIURETIC PEPTIDE: B Natriuretic Peptide: 953.5 pg/mL — ABNORMAL HIGH (ref 0.0–100.0)

## 2017-04-27 MED ORDER — IOPAMIDOL (ISOVUE-300) INJECTION 61%
INTRAVENOUS | Status: AC
  Filled 2017-04-27: qty 100

## 2017-04-27 MED ORDER — SODIUM CHLORIDE 0.9 % IV SOLN
500.0000 mg | Freq: Once | INTRAVENOUS | Status: AC
  Administered 2017-04-28: 500 mg via INTRAVENOUS
  Filled 2017-04-27: qty 500

## 2017-04-27 MED ORDER — SODIUM CHLORIDE 0.9 % IV SOLN
1.0000 g | Freq: Once | INTRAVENOUS | Status: AC
  Administered 2017-04-27: 1 g via INTRAVENOUS
  Filled 2017-04-27: qty 10

## 2017-04-27 MED ORDER — IOPAMIDOL (ISOVUE-300) INJECTION 61%
100.0000 mL | Freq: Once | INTRAVENOUS | Status: AC | PRN
  Administered 2017-04-27: 100 mL via INTRAVENOUS

## 2017-04-27 NOTE — ED Provider Notes (Signed)
[facility] EMERGENCY DEPARTMENT Provider Note   CSN: [id number] Arrival date & time: 04/27/17  1646     History   Chief Complaint No chief complaint on file.   HPI [name] is a 84 y.o. female presenting for evaluation of shortness of breath, nausea, and left lower quadrant pain.  Patient states for the past 3 days, she has had increased shortness of breath, cough, and generalized weakness.  She reports associated nausea, and subjective fevers.  She states she has had to increase her oxygen, from 2 to 3/4 L.  Additionally, patient reports left lower quadrant pain, states this feels similar to irritation that she has from her hernia, although her hernia is umbilical.  She has had decreased urination, despite Lasix, and reports intermittent dysuria without hematuria.  She was given for Zofran with EMS, which resolved her nausea.  She reports her daughter is sick at home with similar symptoms.  She denies headache, vision changes, chest pain, vomiting, abnormal bowel movements.  No change in medications recently.  History of CHF, COPD, diverticulitis. She is on xarelto, takes it daily. She reports BLE edema, L>R.   HPI  Past Medical History:  Diagnosis Date  . Anemia    years ago  . Arthritis    "right shoulder" (01/02/2012)  . Bladder mass   . CAP (community acquired pneumonia) 01/02/2012   Lower lobe   . CHF (congestive heart failure) ([place])   . COPD (chronic obstructive pulmonary disease) ([place])   . Difficulty sleeping   . Diverticulitis of colon with perforation 01/14/2012   That is post sigmoid resection with [facility] pouch and end colostomy   . GERD (gastroesophageal reflux disease)    occasional  . Goiter    radioactive iodine ablation/notes 01/02/2012  . History of kidney stones    X 1  . Hypertension   . Hypothyroidism    "I have taken Synthroid before" (01/02/2012)   HAS HAD RADIOACTIVE IODINE TX 2 YR S AGO  . Intra-abdominal abscess ([place]) 01/14/2012  .  PAF (paroxysmal atrial fibrillation) ([place])   . Pneumatosis of intestines 01/14/2012  . Pneumonia 01/02/2012; ? 2009  . Radiation 08/10/13-09/15/13   57 gray to vaginal/bladder mass  . SOB (shortness of breath)    'all the time" (01/02/2012)  . Stroke ([place])   . UTI (lower urinary tract infection) 01/03/2012    Patient Active Problem List   Diagnosis Date Noted  . Pneumonia 04/27/2017  . Gait abnormality 05/18/2016  . History of squamous cell carcinoma 03/04/2016  . Chest pain 12/28/2015  . Chronic respiratory failure, unsp w hypoxia or hypercapnia (HCC) 12/15/2015  . PAF (paroxysmal atrial fibrillation) ([place]) 08/27/2015  . Essential hypertension 06/15/2015  . Squamous cell carcinoma, metastatic ([place]) 06/15/2015  . Acute CVA (cerebrovascular accident) ([place]) 04/13/2015  . Cerebrovascular accident (CVA) due to embolism of left middle cerebral artery ([place])   . HLD (hyperlipidemia)   . Cerebral thrombosis with cerebral infarction 04/12/2015  . Stroke-like symptoms   . Dysarthria 04/11/2015  . Absolute anemia 09/04/2014  . Elevated troponin 02/14/2014  . Dyspnea   . Overweight (BMI 25.0-29.9) 02/13/2014  . Goiter 02/13/2014  . Chronic systolic CHF (congestive heart failure) ([place]) 02/13/2014  . Squamous cell carcinoma of unknown origin 07/24/2013  . S/P colostomy takedown 10/21/2012 10/23/2012  . Diverticulosis of colon 03/07/2012  . Pulmonary nodules 01/23/2012  . Hematuria, microscopic 01/14/2012  . Asymptomatic PVCs 01/03/2012  . H/O: hypothyroidism 01/03/2012  . COPD exacerbation (  [place]) 01/02/2012  . Hypertension 01/02/2012  . Chronic combined systolic and diastolic CHF (congestive heart failure) ([place]) 01/02/2012  . Hx of goiter 01/02/2012    Past Surgical History:  Procedure Laterality Date  . ABDOMINAL HYSTERECTOMY  1980's  . APPENDECTOMY  1980's   "when they did hysterectomy" (01/02/2012)  . CATARACT EXTRACTION W/ INTRAOCULAR LENS  IMPLANT, BILATERAL  ?1990's   [place]  . COLON  SURGERY    . COLOSTOMY N/A 03/12/2012   Procedure: COLOSTOMY;  Surgeon: Ralene Ok, MD;  Location: [facility];  Service: General;  Laterality: N/A;  . COLOSTOMY REVISION N/A 03/12/2012   Procedure: COLON RESECTION SIGMOID ;  Surgeon: Ralene Ok, MD;  Location: [facility];  Service: General;  Laterality: N/A;  . COLOSTOMY TAKEDOWN N/A 10/21/2012   Procedure: LAPAROSCOPIC COLOSTOMY TAKEDOWN AND HARTMANS ANASTOMOSIS, rigid proctoscopy;  Surgeon: Ralene Ok, MD;  Location: [facility];  Service: General;  Laterality: N/A;  . EYE SURGERY    . LYSIS OF ADHESION N/A 10/21/2012   Procedure: LYSIS OF ADHESION;  Surgeon: Ralene Ok, MD;  Location: [facility];  Service: General;  Laterality: N/A;  . TRANSURETHRAL RESECTION OF BLADDER TUMOR WITH GYRUS (TURBT-GYRUS) N/A 06/30/2013   Procedure: TRANSURETHRAL RESECTION OF BLADDER TUMOR WITH GYRUS (TURBT-GYRUS)/VAGINAL BIOPSY;  Surgeon: Ardis Hughs, MD;  Location: [facility];  Service: Urology;  Laterality: N/A;     OB History   None      Home Medications    Prior to Admission medications   Medication Sig Start Date End Date Taking? Authorizing Provider  acetaminophen (TYLENOL) 500 MG tablet Take 1,000 mg by mouth every 6 (six) hours as needed for mild pain. Reported on 07/14/2015   Yes [provider]  albuterol (PROVENTIL HFA;VENTOLIN HFA) 108 (90 BASE) MCG/ACT inhaler Inhale 2 puffs into the lungs every 6 (six) hours as needed for wheezing.   Yes [provider]  albuterol (PROVENTIL) (2.5 MG/3ML) 0.083% nebulizer solution Take 3 mLs (2.5 mg total) by nebulization every 6 (six) hours as needed for wheezing or shortness of breath. 12/17/15  Yes Elgergawy, Silver Huguenin, MD  apixaban (ELIQUIS) 5 MG TABS tablet Take 1 tablet (5 mg total) by mouth 2 (two) times daily. 08/28/15  Yes Ghimire, Henreitta Leber, MD  cetirizine (ZYRTEC) 10 MG tablet Take 10 mg by mouth daily.   Yes [provider]  cholecalciferol (VITAMIN D) 1000 units tablet  Take 1,000 Units by mouth daily.   Yes [provider]  diphenhydramine-acetaminophen (TYLENOL PM) 25-500 MG TABS tablet Take 1 tablet by mouth at bedtime as needed (sleep).    Yes [provider]  ferrous sulfate 325 (65 FE) MG tablet Take 325 mg by mouth 2 (two) times daily with a meal.    Yes [provider]  furosemide (LASIX) 40 MG tablet Take 1 tablet (40 mg total) by mouth daily. 12/18/15  Yes Elgergawy, Silver Huguenin, MD  lisinopril (PRINIVIL,ZESTRIL) 20 MG tablet Take 20 mg by mouth daily.  03/16/16  Yes [provider]  polyethylene glycol (MIRALAX / GLYCOLAX) packet Take 17 g by mouth daily.   Yes [provider]  polyvinyl alcohol (LIQUIFILM TEARS) 1.4 % ophthalmic solution Place 1 drop into both eyes as needed for dry eyes.   Yes [provider]  omeprazole (PRILOSEC) 20 MG capsule Take 20 mg by mouth 2 (two) times daily.    [provider]    Family History Family History  Problem Relation Age of Onset  . Cancer  Sister        Breast  . Stroke Sister   . Heart disease Mother   . Heart disease Unknown     Social History Social History   Tobacco Use  . Smoking status: Former Smoker    Packs/day: 0.12    Years: 35.00    Pack years: 4.20    Types: Cigarettes    Last attempt to quit: 01/02/1991    Years since quitting: 26.3  . Smokeless tobacco: Never Used  Substance Use Topics  . Alcohol use: No  . Drug use: No     Allergies   Indomethacin   Review of Systems Review of Systems  Constitutional: Positive for fever.  Respiratory: Positive for cough and shortness of breath.   Gastrointestinal: Positive for abdominal pain and nausea.  Genitourinary: Positive for decreased urine volume and dysuria.  All other systems reviewed and are negative.   Physical Exam Updated Vital Signs BP 100/89   Pulse 76   Temp 99.5 F (37.5 C) (Oral)   Resp (!) 25   SpO2 98%   Physical Exam  Constitutional: She is  oriented to person, place, and time. No distress.  Elderly appearing female, chronically and acutely ill.   HENT:  Head: Normocephalic and atraumatic.  MM dry  Eyes: Pupils are equal, round, and reactive to light. Conjunctivae and EOM are normal.  Neck: Normal range of motion. Neck supple.  Cardiovascular: Normal rate, regular rhythm and intact distal pulses.  Pulmonary/Chest: Breath sounds normal. Accessory muscle usage present. Tachypnea noted. No respiratory distress. She has no decreased breath sounds. She has no wheezes. She has no rhonchi. She has no rales.  Increased work of breathing, although no respiratory distress.  On oxygen via nasal cannula.  Speaking in short sentences.  Clear lung sounds.  Abdominal: Soft. She exhibits no distension and no mass. There is tenderness. There is no rebound and no guarding.  TTP of LLQ. Soft nontender umbilical hernia without erythema.   Musculoskeletal: Normal range of motion.  Neurological: She is alert and oriented to person, place, and time.  Skin: Skin is warm and dry.  Psychiatric: She has a normal mood and affect.  Nursing note and vitals reviewed.   ED Treatments / Results  Labs (all labs ordered are listed, but only abnormal results are displayed) Labs Reviewed  COMPREHENSIVE METABOLIC PANEL - Abnormal; Notable for the following components:      Result Value   Chloride 98 (*)    Glucose, Bld 118 (*)    ALT 11 (*)    Total Bilirubin 1.9 (*)    GFR calc non Af Amer 58 (*)    All other components within normal limits  CBC - Abnormal; Notable for the following components:   WBC 17.4 (*)    All other components within normal limits  BRAIN NATRIURETIC PEPTIDE - Abnormal; Notable for the following components:   B Natriuretic Peptide 953.5 (*)    All other components within normal limits  URINE CULTURE  CULTURE, BLOOD (ROUTINE X 2)  CULTURE, BLOOD (ROUTINE X 2)  LIPASE, BLOOD  URINALYSIS, ROUTINE W REFLEX MICROSCOPIC  I-STAT CG4  LACTIC ACID, ED  I-STAT CG4 LACTIC ACID, ED  I-STAT TROPONIN, ED  I-STAT CG4 LACTIC ACID, ED    EKG EKG Interpretation  Date/Time:  Saturday April 27 2017 20:34:12 EDT Ventricular Rate:  80 PR Interval:    QRS Duration: 145 QT Interval:  402 QTC Calculation: 464 R Axis:   -67  Text Interpretation:  Sinus rhythm Left bundle branch block Baseline wander in lead(s) V6 No significant change since last tracing Confirmed by Orlie Dakin [phone]) on 04/27/2017 8:40:04 PM   Radiology Dg Chest 2 View  Result Date: 04/27/2017 CLINICAL DATA:  Patient with shortness of breath. EXAM: CHEST - 2 VIEW COMPARISON:  Chest radiograph 11/27/2016. FINDINGS: Monitoring leads overlie the patient. Stable enlarged cardiac and mediastinal contours. Heterogeneous opacities right mid lower lung. These are superimposed upon chronic changes. No pleural effusion or pneumothorax. Thoracic spine degenerative changes. IMPRESSION: Right lower lung heterogeneous opacities may represent atelectasis or infection. Electronically Signed   By: Lovey Newcomer M.D.   On: 04/27/2017 20:33   Ct Abdomen Pelvis W Contrast  Result Date: 04/27/2017 CLINICAL DATA:  Left lower quadrant pain with nausea and vomiting. History to hernia on the same side. Pain with urination and foul-smelling urine. EXAM: CT ABDOMEN AND PELVIS WITH CONTRAST TECHNIQUE: Multidetector CT imaging of the abdomen and pelvis was performed using the standard protocol following bolus administration of intravenous contrast. CONTRAST:  170mL ISOVUE-300 IOPAMIDOL (ISOVUE-300) INJECTION 61% COMPARISON:  CT AP 03/30/2013 and CT pelvis 12/01/2013 FINDINGS: Lower chest: Bilateral lower lobe pulmonary consolidations consistent with pneumonia. No effusion or pneumothorax. Heart is normal in size mitral annular calcifications. No pericardial effusion. Hepatobiliary: No space-occupying mass of the liver. No biliary dilatation. No evidence of cholelithiasis or cholecystitis. Pancreas:  Atrophic pancreas without mass or inflammation. Spleen: 14 mm acquired cyst of the spleen. No enhancing mass or subcapsular fluid. No splenomegaly. Adrenals/Urinary Tract: Hyperplastic thickening of the adrenal glands dating back to 2015. Stable simple and complex bilateral renal cysts complex cyst in the interpolar right kidney with thin septations, unchanged overall in size measuring approximately 18 mm. No hydroureter. No focal mural thickening of the bladder. No nephrolithiasis nor obstructive uropathy. Stomach/Bowel: Decompressed stomach with normal small bowel rotation. Mild fluid-filled small bowel distention secondary to an infraumbilical hernia containing a short segment of jejunum contributing to partial or early SBO. The small bowel leading up to the most caudal hernia measures 2.8 cm in caliber. There are 3 additional hernia sacs in a cluster about the umbilicus, the most left sided containing predominantly omental fat, the most cephalad and right-sided containing small segment of jejunum and omental fat as well as a small hernia interposed between the periumbilical and infraumbilical hernia. Vascular/Lymphatic: Moderate aortoiliac atherosclerosis without aneurysm or dissection. No adenopathy. Reproductive: Hysterectomy. No adnexal mass. No recurrence of mass at the base of the bladder. Other: No acute bowel inflammation. Left-sided colonic diverticulosis is noted. Musculoskeletal: Thoracolumbar spondylosis without worrisome osseous lesions. IMPRESSION: 1. Redemonstration of 3 periumbilical hernias containing variable degrees of omental fat and small bowel. What appears to be causing early or partial SBO is an infraumbilical ventral hernia separate from these 3 causing dilatation of fluid-filled bowel to 2.8 cm in caliber. No evidence of incarceration. 2. 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 3. Bilateral lower lobe pulmonary consolidations consistent with pneumonia  and/or atelectasis. 4. 14 mm acquired cyst in the spleen since prior. 5. Stable hyperplastic appearance of the adrenal glands dating back to 2015. 6. Simple and complex renal cysts. No nephrolithiasis nor obstructive uropathy. 7. Diffuse thoracolumbar degenerative disc disease and facet arthropathy consistent with spondylosis. No aggressive osseous lesions. Electronically Signed   By: Ashley Royalty M.D.   On: 04/27/2017 22:46    Procedures .Critical Care Performed by: Franchot Heidelberg, PA-C Authorized by: Franchot Heidelberg, PA-C  Critical care provider statement:    Critical care time (minutes):  35   Critical care time was exclusive of:  Separately billable procedures and treating other patients and teaching time   Critical care was necessary to treat or prevent imminent or life-threatening deterioration of the following conditions:  Sepsis   Critical care was time spent personally by me on the following activities:  Blood draw for specimens, development of treatment plan with patient or surrogate, discussions with consultants, discussions with primary provider, evaluation of patient's response to treatment, obtaining history from patient or surrogate, ordering and performing treatments and interventions, ordering and review of laboratory studies, ordering and review of radiographic studies, pulse oximetry, re-evaluation of patient's condition and review of old charts   I assumed direction of critical care for this patient from another provider in my specialty: no   Comments:     Pt septic, IV abx started   (including critical care time)  Medications Ordered in ED Medications  azithromycin (ZITHROMAX) 500 mg in sodium chloride 0.9 % 250 mL IVPB (500 mg Intravenous New Bag/Given 04/28/17 0009)  iopamidol (ISOVUE-300) 61 % injection (has no administration in time range)  cefTRIAXone (ROCEPHIN) 1 g in sodium chloride 0.9 % 100 mL IVPB (0 g Intravenous Stopped 04/27/17 2332)  iopamidol  (ISOVUE-300) 61 % injection 100 mL (100 mLs Intravenous Contrast Given 04/27/17 2201)     Initial Impression / Assessment and Plan / ED Course  I have reviewed the triage vital signs and the nursing notes.  Pertinent labs & imaging results that were available during my care of the patient were reviewed by me and considered in my medical decision making (see chart for details).     Patient presenting for evaluation of shortness of breath, cough, and left lower quadrant abdominal pain.  Physical exam shows an elderly, chronically ill-appearing female who has increased work of breathing.  She is not in respiratory distress.  She is tachypneic and initially febrile.  Initial labs showed leukocytosis at 17.4 electrolytes stable.  Creatinine stable.  Troponin negative.  Lactate negative.  EKG without signs of STEMI, unchanged from prior.  Will obtain BNP, chest x-ray, and CT abdomen.  Case discussed with attending, Dr. Winfred Leeds evaluated the patient. ?COPD exacerbation versus pneumonia versus diverticulitis.  Chest x-ray shows right lower lobe pneumonia.  CT shows stable umbilical hernia with possible early SBO.  No sign of diverticulitis.  Abdominal exam reassuring, soft without tenderness of the hernia.  I do not believe there is need for surgery/emergent surgical consult at this time.  As patient has source, and meet sirs criteria, will call code sepsis and start antibiotics.  BNP elevated at >900, and blood pressure stable, will not start IV fluids.  Case discussed with Dr. Maudie Mercury from [facility] hospitalist service, patient to be admitted for pneumonia with new oxygen demand.   Final Clinical Impressions(s) / ED Diagnoses   Final diagnoses:  Pneumonia of right lower lobe due to infectious organism ([place])  Hypoxia  Elevated brain natriuretic peptide (BNP) level  LLQ abdominal pain    ED Discharge Orders    None       Franchot Heidelberg, PA-C 04/28/17 0037    Orlie Dakin, MD 04/28/17  [phone]

## 2017-04-27 NOTE — ED Notes (Signed)
Patient transported to CT 

## 2017-04-27 NOTE — ED Notes (Signed)
Attempted secondary IV access and blood culture collection x2, without success.

## 2017-04-27 NOTE — ED Notes (Signed)
Placed purewick on pt. 

## 2017-04-27 NOTE — ED Notes (Signed)
Nurse starting IV and drawing labs. 

## 2017-04-27 NOTE — ED Notes (Signed)
Patient transported to X-ray 

## 2017-04-27 NOTE — ED Triage Notes (Addendum)
Pt here from Urgent Care brought by [facility]. Pt c.o. LLQ abdominal pain with nausea and vomitting. Hx of hernia to same side that she has had for years. Pt also reports painful urination with cloudy foul smelling urine. 20G LAC, 4 of zofran given by EMS. Nausea improved. Febrile at triage, warm to touch. Denies chest pain or sob. Pt wears 2-3 L of O2 at baseline.

## 2017-04-27 NOTE — ED Provider Notes (Signed)
Signs of cough productive of yellow sputum, shortness of breath and left lower quadrant discomfort for the past 3 to 4 days.  She admits to nausea but no vomiting.  She last ate yesterday morning.  She is not presently hungry.  On exam she is chronically and acutely ill-appearing.  Lungs tachypneic, speaks in sentences.  Scant diffuse crackles abdomen obese, tender at left lower quadrant without guarding rigidity or rebound.  Heart regular rate and rhythm extremities without edema.  Skin warm dry   Orlie Dakin, MD 04/27/17 2143

## 2017-04-28 ENCOUNTER — Inpatient Hospital Stay (HOSPITAL_COMMUNITY): Payer: Medicare Other

## 2017-04-28 ENCOUNTER — Encounter (HOSPITAL_COMMUNITY): Payer: Self-pay

## 2017-04-28 ENCOUNTER — Other Ambulatory Visit: Payer: Self-pay

## 2017-04-28 DIAGNOSIS — J189 Pneumonia, unspecified organism: Secondary | ICD-10-CM | POA: Diagnosis present

## 2017-04-28 DIAGNOSIS — K219 Gastro-esophageal reflux disease without esophagitis: Secondary | ICD-10-CM

## 2017-04-28 DIAGNOSIS — R1032 Left lower quadrant pain: Secondary | ICD-10-CM

## 2017-04-28 LAB — URINALYSIS, ROUTINE W REFLEX MICROSCOPIC
BILIRUBIN URINE: NEGATIVE
GLUCOSE, UA: NEGATIVE mg/dL
HGB URINE DIPSTICK: NEGATIVE
Ketones, ur: NEGATIVE mg/dL
NITRITE: NEGATIVE
PH: 5 (ref 5.0–8.0)
Protein, ur: 100 mg/dL — AB

## 2017-04-28 LAB — CBC
HCT: 32.4 % — ABNORMAL LOW (ref 36.0–46.0)
HEMOGLOBIN: 11.7 g/dL — AB (ref 12.0–15.0)
MCH: 36.2 pg — ABNORMAL HIGH (ref 26.0–34.0)
MCHC: 36.1 g/dL — ABNORMAL HIGH (ref 30.0–36.0)
MCV: 100.3 fL — AB (ref 78.0–100.0)
Platelets: 215 10*3/uL (ref 150–400)
RBC: 3.23 MIL/uL — AB (ref 3.87–5.11)
RDW: 12.7 % (ref 11.5–15.5)
WBC: 16.4 10*3/uL — AB (ref 4.0–10.5)

## 2017-04-28 LAB — COMPREHENSIVE METABOLIC PANEL
ALK PHOS: 68 U/L (ref 38–126)
ALT: 8 U/L — AB (ref 14–54)
AST: 17 U/L (ref 15–41)
Albumin: 3.2 g/dL — ABNORMAL LOW (ref 3.5–5.0)
Anion gap: 10 (ref 5–15)
BILIRUBIN TOTAL: 1.4 mg/dL — AB (ref 0.3–1.2)
BUN: 18 mg/dL (ref 6–20)
CHLORIDE: 101 mmol/L (ref 101–111)
CO2: 26 mmol/L (ref 22–32)
CREATININE: 0.88 mg/dL (ref 0.44–1.00)
Calcium: 8.4 mg/dL — ABNORMAL LOW (ref 8.9–10.3)
GFR calc Af Amer: >60 mL/min (ref >60)
GFR, EST NON AFRICAN AMERICAN: 57 mL/min — AB (ref >60)
Glucose, Bld: 126 mg/dL — ABNORMAL HIGH (ref 65–99)
Potassium: 4.2 mmol/L (ref 3.5–5.1)
Sodium: 137 mmol/L (ref 135–145)
Total Protein: 6.2 g/dL — ABNORMAL LOW (ref 6.5–8.1)

## 2017-04-28 LAB — RESPIRATORY PANEL BY PCR
ADENOVIRUS-RVPPCR: NOT DETECTED
Bordetella pertussis: NOT DETECTED
CHLAMYDOPHILA PNEUMONIAE-RVPPCR: NOT DETECTED
CORONAVIRUS NL63-RVPPCR: NOT DETECTED
Coronavirus 229E: NOT DETECTED
Coronavirus HKU1: NOT DETECTED
Coronavirus OC43: NOT DETECTED
INFLUENZA A-RVPPCR: NOT DETECTED
Influenza B: NOT DETECTED
MYCOPLASMA PNEUMONIAE-RVPPCR: NOT DETECTED
Metapneumovirus: NOT DETECTED
PARAINFLUENZA VIRUS 4-RVPPCR: NOT DETECTED
Parainfluenza Virus 1: NOT DETECTED
Parainfluenza Virus 2: NOT DETECTED
Parainfluenza Virus 3: NOT DETECTED
Respiratory Syncytial Virus: NOT DETECTED
Rhinovirus / Enterovirus: NOT DETECTED

## 2017-04-28 LAB — HIV ANTIBODY (ROUTINE TESTING W REFLEX): HIV Screen 4th Generation wRfx: NONREACTIVE

## 2017-04-28 LAB — I-STAT CG4 LACTIC ACID, ED: Lactic Acid, Venous: 1.3 mmol/L (ref 0.5–1.9)

## 2017-04-28 LAB — STREP PNEUMONIAE URINARY ANTIGEN: Strep Pneumo Urinary Antigen: NEGATIVE

## 2017-04-28 MED ORDER — LORATADINE 10 MG PO TABS
10.0000 mg | ORAL_TABLET | Freq: Every day | ORAL | Status: DC
  Administered 2017-04-28 – 2017-05-01 (×4): 10 mg via ORAL
  Filled 2017-04-28 (×4): qty 1

## 2017-04-28 MED ORDER — SODIUM CHLORIDE 0.9 % IV SOLN
1.0000 g | INTRAVENOUS | Status: DC
  Administered 2017-04-29 – 2017-05-01 (×3): 1 g via INTRAVENOUS
  Filled 2017-04-28 (×3): qty 10

## 2017-04-28 MED ORDER — FERROUS SULFATE 325 (65 FE) MG PO TABS
325.0000 mg | ORAL_TABLET | Freq: Two times a day (BID) | ORAL | Status: DC
  Administered 2017-04-28 – 2017-05-01 (×7): 325 mg via ORAL
  Filled 2017-04-28 (×7): qty 1

## 2017-04-28 MED ORDER — POLYVINYL ALCOHOL 1.4 % OP SOLN: 1.0000 [drp] | OPHTHALMIC | Status: DC | PRN

## 2017-04-28 MED ORDER — SODIUM CHLORIDE 0.9 % IV SOLN
500.0000 mg | INTRAVENOUS | Status: DC
  Administered 2017-04-29 – 2017-05-01 (×3): 500 mg via INTRAVENOUS
  Filled 2017-04-28 (×3): qty 500

## 2017-04-28 MED ORDER — LISINOPRIL 20 MG PO TABS
20.0000 mg | ORAL_TABLET | Freq: Every day | ORAL | Status: DC
  Administered 2017-04-28: 20 mg via ORAL
  Filled 2017-04-28: qty 1

## 2017-04-28 MED ORDER — POLYETHYLENE GLYCOL 3350 17 G PO PACK
17.0000 g | PACK | Freq: Every day | ORAL | Status: DC
  Administered 2017-04-28 – 2017-04-30 (×3): 17 g via ORAL
  Filled 2017-04-28 (×3): qty 1

## 2017-04-28 MED ORDER — ALBUTEROL SULFATE (2.5 MG/3ML) 0.083% IN NEBU: 2.5000 mg | INHALATION_SOLUTION | RESPIRATORY_TRACT | Status: DC | PRN

## 2017-04-28 MED ORDER — IPRATROPIUM-ALBUTEROL 0.5-2.5 (3) MG/3ML IN SOLN: 3.0000 mL | RESPIRATORY_TRACT | Status: DC

## 2017-04-28 MED ORDER — IPRATROPIUM-ALBUTEROL 0.5-2.5 (3) MG/3ML IN SOLN
3.0000 mL | Freq: Three times a day (TID) | RESPIRATORY_TRACT | Status: DC
  Administered 2017-04-28 – 2017-05-01 (×8): 3 mL via RESPIRATORY_TRACT
  Filled 2017-04-28 (×9): qty 3

## 2017-04-28 MED ORDER — APIXABAN 5 MG PO TABS
5.0000 mg | ORAL_TABLET | Freq: Two times a day (BID) | ORAL | Status: DC
  Administered 2017-04-28 – 2017-05-01 (×7): 5 mg via ORAL
  Filled 2017-04-28 (×7): qty 1

## 2017-04-28 MED ORDER — IPRATROPIUM-ALBUTEROL 0.5-2.5 (3) MG/3ML IN SOLN
3.0000 mL | Freq: Four times a day (QID) | RESPIRATORY_TRACT | Status: DC
  Administered 2017-04-28: 3 mL via RESPIRATORY_TRACT
  Filled 2017-04-28: qty 3

## 2017-04-28 MED ORDER — DIPHENHYDRAMINE-APAP (SLEEP) 25-500 MG PO TABS: 1.0000 | ORAL_TABLET | Freq: Every evening | ORAL | Status: DC | PRN

## 2017-04-28 MED ORDER — DIPHENHYDRAMINE HCL 25 MG PO CAPS
25.0000 mg | ORAL_CAPSULE | Freq: Every evening | ORAL | Status: DC | PRN
  Administered 2017-04-28 – 2017-04-30 (×4): 25 mg via ORAL
  Filled 2017-04-28 (×4): qty 1

## 2017-04-28 MED ORDER — ALBUTEROL SULFATE (2.5 MG/3ML) 0.083% IN NEBU: 2.5000 mg | INHALATION_SOLUTION | Freq: Four times a day (QID) | RESPIRATORY_TRACT | Status: DC | PRN

## 2017-04-28 MED ORDER — ACETAMINOPHEN 500 MG PO TABS
500.0000 mg | ORAL_TABLET | Freq: Every evening | ORAL | Status: DC | PRN
  Administered 2017-04-28 – 2017-04-30 (×4): 500 mg via ORAL
  Filled 2017-04-28 (×5): qty 1

## 2017-04-28 MED ORDER — GUAIFENESIN ER 600 MG PO TB12: 600.0000 mg | ORAL_TABLET | Freq: Two times a day (BID) | ORAL | Status: DC | PRN

## 2017-04-28 MED ORDER — SODIUM CHLORIDE 0.9 % IV SOLN
INTRAVENOUS | Status: AC
  Administered 2017-04-28: 02:00:00 via INTRAVENOUS

## 2017-04-28 MED ORDER — ORAL CARE MOUTH RINSE
15.0000 mL | Freq: Two times a day (BID) | OROMUCOSAL | Status: DC
  Administered 2017-04-28 – 2017-04-30 (×3): 15 mL via OROMUCOSAL

## 2017-04-28 MED ORDER — PANTOPRAZOLE SODIUM 40 MG PO TBEC
40.0000 mg | DELAYED_RELEASE_TABLET | Freq: Every day | ORAL | Status: DC
  Administered 2017-04-28 – 2017-05-01 (×4): 40 mg via ORAL
  Filled 2017-04-28 (×4): qty 1

## 2017-04-28 MED ORDER — FUROSEMIDE 40 MG PO TABS
40.0000 mg | ORAL_TABLET | Freq: Every day | ORAL | Status: DC
  Administered 2017-04-28: 40 mg via ORAL
  Filled 2017-04-28: qty 2

## 2017-04-28 NOTE — ED Notes (Signed)
Bladder scanner showed 56mL. Verified twice.

## 2017-04-28 NOTE — H&P (Addendum)
TRH H&P   Patient Demographics:    [name], is a 84 y.o. female  MRN: [id number]   DOB - 1925-08-14  Admit Date - 04/27/2017  Outpatient Primary MD for the patient is Leonard Downing, MD  Referring MD/NP/PA:   Orlie Dakin  Outpatient Specialists:   Patient coming from:  home  No chief complaint on file.  cough   HPI:    [name]  is a 84 y.o. female, w hypertension, Pafib, CHF, CVA, Copd, presents with c/o yellow sputum and dyspnea for the past few days.  Pt denies fever, chills, cp, palp, n/v, diarrhea, brbpr.  Pt noted some left lower quadrant pain as well.  Pt presented to ED due to increase dyspnea.    In ED,  CXR IMPRESSION: Right lower lung heterogeneous opacities may represent atelectasis or infection.  CT scan abd/ pelvis  IMPRESSION: 1. Redemonstration of 3 periumbilical hernias containing variable degrees of omental fat and small bowel. What appears to be causing early or partial SBO is an infraumbilical ventral hernia separate from these 3 causing dilatation of fluid-filled bowel to 2.8 cm in caliber. No evidence of incarceration. 2. 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 3. Bilateral lower lobe pulmonary consolidations consistent with pneumonia and/or atelectasis. 4. 14 mm acquired cyst in the spleen since prior. 5. Stable hyperplastic appearance of the adrenal glands dating back to 2015. 6. Simple and complex renal cysts. No nephrolithiasis nor obstructive uropathy. 7. Diffuse thoracolumbar degenerative disc disease and facet arthropathy consistent with spondylosis. No aggressive osseous lesions.  Lipase 26,  Na 137, K 4.1, Bun 16, Creatinine 0.86, Ast 17, Alt 11  Wbc 17.4, Hgb 12.5, Plt 224 Lactic acid 1.27 BNP 953.5   Trop 0.06  Pt will be admitted for pneumonia, and possible early or  partial  SBO.    Review of systems:    In addition to the HPI above,  No Fever-chills, No Headache, No changes with Vision or hearing, No problems swallowing food or Liquids, No Chest pain, Cough or Shortness of Breath, No Blood in stool or Urine, No dysuria, No new skin rashes or bruises, No new joints pains-aches,  No new weakness, tingling, numbness in any extremity, No recent weight gain or loss, No polyuria, polydypsia or polyphagia, No significant Mental Stressors.  A full 10 point Review of Systems was done, except as stated above, all other Review of Systems were negative.   With Past History of the following :    Past Medical History:  Diagnosis Date  . Anemia    years ago  . Arthritis    "right shoulder" (01/02/2012)  . Bladder mass   . CAP (community acquired pneumonia) 01/02/2012   Lower lobe   . CHF (congestive heart failure) ([place])   . COPD (chronic obstructive pulmonary disease) ([place])   . Difficulty  sleeping   . Diverticulitis of colon with perforation 01/14/2012   That is post sigmoid resection with [facility] pouch and end colostomy   . GERD (gastroesophageal reflux disease)    occasional  . Goiter    radioactive iodine ablation/notes 01/02/2012  . History of kidney stones    X 1  . Hypertension   . Hypothyroidism    "I have taken Synthroid before" (01/02/2012)   HAS HAD RADIOACTIVE IODINE TX 2 YR S AGO  . Intra-abdominal abscess ([place]) 01/14/2012  . PAF (paroxysmal atrial fibrillation) ([place])   . Pneumatosis of intestines 01/14/2012  . Pneumonia 01/02/2012; ? 2009  . Radiation 08/10/13-09/15/13   57 gray to vaginal/bladder mass  . SOB (shortness of breath)    'all the time" (01/02/2012)  . Stroke ([place])   . UTI (lower urinary tract infection) 01/03/2012      Past Surgical History:  Procedure Laterality Date  . ABDOMINAL HYSTERECTOMY  1980's  . APPENDECTOMY  1980's   "when they did hysterectomy" (01/02/2012)  . CATARACT EXTRACTION W/ INTRAOCULAR LENS  IMPLANT,  BILATERAL  ?1990's   Bil  . COLON SURGERY    . COLOSTOMY N/A 03/12/2012   Procedure: COLOSTOMY;  Surgeon: Ralene Ok, MD;  Location: [place];  Service: General;  Laterality: N/A;  . COLOSTOMY REVISION N/A 03/12/2012   Procedure: COLON RESECTION SIGMOID ;  Surgeon: Ralene Ok, MD;  Location: [place];  Service: General;  Laterality: N/A;  . COLOSTOMY TAKEDOWN N/A 10/21/2012   Procedure: LAPAROSCOPIC COLOSTOMY TAKEDOWN AND HARTMANS ANASTOMOSIS, rigid proctoscopy;  Surgeon: Ralene Ok, MD;  Location: [facility];  Service: General;  Laterality: N/A;  . EYE SURGERY    . LYSIS OF ADHESION N/A 10/21/2012   Procedure: LYSIS OF ADHESION;  Surgeon: Ralene Ok, MD;  Location: [facility];  Service: General;  Laterality: N/A;  . TRANSURETHRAL RESECTION OF BLADDER TUMOR WITH GYRUS (TURBT-GYRUS) N/A 06/30/2013   Procedure: TRANSURETHRAL RESECTION OF BLADDER TUMOR WITH GYRUS (TURBT-GYRUS)/VAGINAL BIOPSY;  Surgeon: Ardis Hughs, MD;  Location: [facility];  Service: Urology;  Laterality: N/A;      Social History:     Social History   Tobacco Use  . Smoking status: Former Smoker    Packs/day: 0.12    Years: 35.00    Pack years: 4.20    Types: Cigarettes    Last attempt to quit: 01/02/1991    Years since quitting: 26.3  . Smokeless tobacco: Never Used  Substance Use Topics  . Alcohol use: No     Lives - at home  Mobility - walks by self   Family History :     Family History  Problem Relation Age of Onset  . Cancer Sister        Breast  . Stroke Sister   . Heart disease Mother   . Heart disease Unknown        Home Medications:   Prior to Admission medications   Medication Sig Start Date End Date Taking? Authorizing Provider  acetaminophen (TYLENOL) 500 MG tablet Take 1,000 mg by mouth every 6 (six) hours as needed for mild pain. Reported on 07/14/2015   Yes [provider]  albuterol (PROVENTIL HFA;VENTOLIN HFA) 108 (90 BASE) MCG/ACT inhaler Inhale 2 puffs into the  lungs every 6 (six) hours as needed for wheezing.   Yes [provider]  albuterol (PROVENTIL) (2.5 MG/3ML) 0.083% nebulizer solution Take 3 mLs (2.5 mg total) by nebulization every 6 (six) hours as needed for wheezing or shortness  of breath. 12/17/15  Yes Elgergawy, Silver Huguenin, MD  apixaban (ELIQUIS) 5 MG TABS tablet Take 1 tablet (5 mg total) by mouth 2 (two) times daily. 08/28/15  Yes Ghimire, Henreitta Leber, MD  cetirizine (ZYRTEC) 10 MG tablet Take 10 mg by mouth daily.   Yes [provider]  cholecalciferol (VITAMIN D) 1000 units tablet Take 1,000 Units by mouth daily.   Yes [provider]  diphenhydramine-acetaminophen (TYLENOL PM) 25-500 MG TABS tablet Take 1 tablet by mouth at bedtime as needed (sleep).    Yes [provider]  ferrous sulfate 325 (65 FE) MG tablet Take 325 mg by mouth 2 (two) times daily with a meal.    Yes [provider]  furosemide (LASIX) 40 MG tablet Take 1 tablet (40 mg total) by mouth daily. 12/18/15  Yes Elgergawy, Silver Huguenin, MD  lisinopril (PRINIVIL,ZESTRIL) 20 MG tablet Take 20 mg by mouth daily.  03/16/16  Yes [provider]  polyethylene glycol (MIRALAX / GLYCOLAX) packet Take 17 g by mouth daily.   Yes [provider]  polyvinyl alcohol (LIQUIFILM TEARS) 1.4 % ophthalmic solution Place 1 drop into both eyes as needed for dry eyes.   Yes [provider]  omeprazole (PRILOSEC) 20 MG capsule Take 20 mg by mouth 2 (two) times daily.    [provider]     Allergies:     Allergies  Allergen Reactions  . Indomethacin Anaphylaxis and Other (See Comments)    "took it fine for awhile; one day I stopped breathing and I ended up in the hospital" (01/02/2012) Pt has tolerated aspirin     Physical Exam:   Vitals  Blood pressure 100/89, pulse 76, temperature 99.5 F (37.5 C), temperature source Oral, resp. rate (!) 25, SpO2 98 %.   1. General lying in bed in NAD,   2. Normal affect and  insight, Not Suicidal or Homicidal, Awake Alert, Oriented X 3.  3. No F.N deficits, ALL C.Nerves Intact, Strength 5/5 all 4 extremities, Sensation intact all 4 extremities, Plantars down going.  4. Ears and Eyes appear Normal, Conjunctivae clear, PERRLA. Moist Oral Mucosa.  5. Supple Neck, No JVD, No cervical lymphadenopathy appriciated, No Carotid Bruits.  6. Symmetrical Chest wall movement, Good air movement bilaterally, bilateral crackle, no wheezing  7. RRR, No Gallops, Rubs or Murmurs, No Parasternal Heave.  8. Positive Bowel Sounds, Abdomen Soft, No tenderness, No organomegaly appriciated,No rebound -guarding or rigidity.  9.  No Cyanosis, Normal Skin Turgor, No Skin Rash or Bruise.  10. Good muscle tone,  joints appear normal , no effusions, Normal ROM.  11. No Palpable Lymph Nodes in Neck or Axillae     Data Review:    CBC Recent Labs  Lab 04/27/17 1656  WBC 17.4*  HGB 12.5  HCT 38.3  PLT 224  MCV 95.0  MCH 31.0  MCHC 32.6  RDW 12.3   ------------------------------------------------------------------------------------------------------------------  Chemistries  Recent Labs  Lab 04/27/17 1656  NA 137  K 4.1  CL 98*  CO2 30  GLUCOSE 118*  BUN 16  CREATININE 0.86  CALCIUM 8.9  AST 17  ALT 11*  ALKPHOS 61  BILITOT 1.9*   ------------------------------------------------------------------------------------------------------------------ CrCl cannot be calculated (Unknown ideal weight.). ------------------------------------------------------------------------------------------------------------------ No results for input(s): TSH, T4TOTAL, T3FREE, THYROIDAB in the last 72 hours.  Invalid input(s): FREET3  Coagulation profile No results for input(s): INR, PROTIME in the last 168 hours. ------------------------------------------------------------------------------------------------------------------- No results for input(s): DDIMER in the last 72  hours. -------------------------------------------------------------------------------------------------------------------  Cardiac Enzymes No results for input(s): CKMB, TROPONINI, MYOGLOBIN in the last 168 hours.  Invalid input(s): CK ------------------------------------------------------------------------------------------------------------------    Component Value Date/Time   BNP 953.5 (H) 04/27/2017 2038     ---------------------------------------------------------------------------------------------------------------  Urinalysis    Component Value Date/Time   COLORURINE YELLOW 12/28/2015 2220   APPEARANCEUR CLOUDY (A) 12/28/2015 2220   LABSPEC 1.025 12/28/2015 2220   LABSPEC 1.010 10/29/2013 1105   PHURINE 6.0 12/28/2015 2220   GLUCOSEU NEGATIVE 12/28/2015 2220   GLUCOSEU Negative 10/29/2013 1105   HGBUR NEGATIVE 12/28/2015 2220   BILIRUBINUR NEGATIVE 12/28/2015 2220   BILIRUBINUR Negative 10/29/2013 1105   KETONESUR NEGATIVE 12/28/2015 2220   PROTEINUR 100 (A) 12/28/2015 2220   UROBILINOGEN 1.0 02/13/2014 1137   UROBILINOGEN 0.2 10/29/2013 1105   NITRITE NEGATIVE 12/28/2015 2220   LEUKOCYTESUR LARGE (A) 12/28/2015 2220   LEUKOCYTESUR Trace 10/29/2013 1105    ----------------------------------------------------------------------------------------------------------------   Imaging Results:    Dg Chest 2 View  Result Date: 04/27/2017 CLINICAL DATA:  Patient with shortness of breath. EXAM: CHEST - 2 VIEW COMPARISON:  Chest radiograph 11/27/2016. FINDINGS: Monitoring leads overlie the patient. Stable enlarged cardiac and mediastinal contours. Heterogeneous opacities right mid lower lung. These are superimposed upon chronic changes. No pleural effusion or pneumothorax. Thoracic spine degenerative changes. IMPRESSION: Right lower lung heterogeneous opacities may represent atelectasis or infection. Electronically Signed   By: Lovey Newcomer M.D.   On: 04/27/2017 20:33   Ct  Abdomen Pelvis W Contrast  Result Date: 04/27/2017 CLINICAL DATA:  Left lower quadrant pain with nausea and vomiting. History to hernia on the same side. Pain with urination and foul-smelling urine. EXAM: CT ABDOMEN AND PELVIS WITH CONTRAST TECHNIQUE: Multidetector CT imaging of the abdomen and pelvis was performed using the standard protocol following bolus administration of intravenous contrast. CONTRAST:  130mL ISOVUE-300 IOPAMIDOL (ISOVUE-300) INJECTION 61% COMPARISON:  CT AP 03/30/2013 and CT pelvis 12/01/2013 FINDINGS: Lower chest: Bilateral lower lobe pulmonary consolidations consistent with pneumonia. No effusion or pneumothorax. Heart is normal in size mitral annular calcifications. No pericardial effusion. Hepatobiliary: No space-occupying mass of the liver. No biliary dilatation. No evidence of cholelithiasis or cholecystitis. Pancreas: Atrophic pancreas without mass or inflammation. Spleen: 14 mm acquired cyst of the spleen. No enhancing mass or subcapsular fluid. No splenomegaly. Adrenals/Urinary Tract: Hyperplastic thickening of the adrenal glands dating back to 2015. Stable simple and complex bilateral renal cysts complex cyst in the interpolar right kidney with thin septations, unchanged overall in size measuring approximately 18 mm. No hydroureter. No focal mural thickening of the bladder. No nephrolithiasis nor obstructive uropathy. Stomach/Bowel: Decompressed stomach with normal small bowel rotation. Mild fluid-filled small bowel distention secondary to an infraumbilical hernia containing a short segment of jejunum contributing to partial or early SBO. The small bowel leading up to the most caudal hernia measures 2.8 cm in caliber. There are 3 additional hernia sacs in a cluster about the umbilicus, the most left sided containing predominantly omental fat, the most cephalad and right-sided containing small segment of jejunum and omental fat as well as a small hernia interposed between the  periumbilical and infraumbilical hernia. Vascular/Lymphatic: Moderate aortoiliac atherosclerosis without aneurysm or dissection. No adenopathy. Reproductive: Hysterectomy. No adnexal mass. No recurrence of mass at the base of the bladder. Other: No acute bowel inflammation. Left-sided colonic diverticulosis is noted. Musculoskeletal: Thoracolumbar spondylosis without worrisome osseous lesions. IMPRESSION: 1. Redemonstration of 3 periumbilical hernias containing variable degrees of omental fat and small bowel. What appears to be causing early or partial  SBO is an infraumbilical ventral hernia separate from these 3 causing dilatation of fluid-filled bowel to 2.8 cm in caliber. No evidence of incarceration. 2. 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 3. Bilateral lower lobe pulmonary consolidations consistent with pneumonia and/or atelectasis. 4. 14 mm acquired cyst in the spleen since prior. 5. Stable hyperplastic appearance of the adrenal glands dating back to 2015. 6. Simple and complex renal cysts. No nephrolithiasis nor obstructive uropathy. 7. Diffuse thoracolumbar degenerative disc disease and facet arthropathy consistent with spondylosis. No aggressive osseous lesions. Electronically Signed   By: Ashley Royalty M.D.   On: 04/27/2017 22:46       Assessment & Plan:    Active Problems:   Pneumonia  CAP Blood culture x2 Sputum gram stain culture Urine strep antigen Urine legionella antigen Rocephin 1 gm iv qday Zithromax 500mg  iv qday  Leukocytosis Secondary to CAP abx as above Check cbc in am  Pafib Eliquis 5mg  po bid  ?early or partial SBO NPO Please consult surgery in am to evaluate  CHF (EF 45-50%) Cont Lasix 40mg  po qday  Copd Albuterol neb 1 po q6h prn   Hypertension Cont lisinopril 20mg  po qday Check cmp in am  Gerd Cont PPI    DVT Prophylaxis Eliquis  AM Labs Ordered, also please review Full Orders  Family Communication: Admission,  patients condition and plan of care including tests being ordered have been discussed with the patient  who indicate understanding and agree with the plan and Code Status.  Code Status DNR  Likely DC to  home  Condition GUARDED    Consults called: please call surgery in AM regarding ? parital SBO  Admission status: inpatient  Time spent in minutes : 45   Jani Gravel M.D on 04/28/2017 at 12:45 AM  Between 7am to 7pm - Pager - [phone]  . After 7pm go to [email or web address] - password [facility]  [facility] - Office  [phone]

## 2017-04-28 NOTE — Progress Notes (Signed)
PROGRESS NOTE    [name]  BTD:[id number] DOB: 19-Sep-1925 DOA: 04/27/2017 PCP: Leonard Downing, MD    Brief Narrative: 84 year old female with hypertension, paroxysmal atrial fibrillation, congestive heart failure, history of CVA, COPD, who presented with productive yellow sputum, dyspnea for several days, diagnosed with acute respiratory failure with hypoxia, in the setting of  right lower lobe  Pneumonia.On presentation, her white count was 17.4, creatinine stable, troponin negative, lactic negative, EKG without signs of STEMI, but BNP was in the 900s.  No IV fluids were initiated, received IV antibiotic with Zithromax and Rocephin.  In addition, the patient complained of left lower quadrant abdominal pain, with CT showing stable umbilical hernia, but possible early small bowel obstruction, without signs of diverticulitis.  Surgery consultation is pending.  Assessment & Plan:   Principal Problem:   Acute on chronic respiratory failure with hypoxia (HCC) Active Problems:   COPD (chronic obstructive pulmonary disease) (HCC)   Hypertension   H/O: hypothyroidism   HLD (hyperlipidemia)   Essential hypertension   Pneumonia   CAP (community acquired pneumonia)   LLQ abdominal pain   GERD (gastroesophageal reflux disease)  Acute Respiratory Failure with Hypoxia likely due to RLL pneumonia   On presentation, her white count was 17.4, normal lactic, received IV antibiotic with Zithromax and Rocephin. S. Pneumonia and L  pneumohila neg.  Osats normal at 2 L. Mild accessory muscle use, but improved from prior  Continue Oxygen [place] 2 L   follow  on blood and sputum cultures Continue Rocephin and ZIthromax Nebulizers as needed with  Duoneb q 6 h prn with prn albuterol q 6 h prn  Mucinex prn  Antipyretics prn  Repeat CBC in am   Suspected partial SBO per CT abdomen and pelvis . No prior h/o SBO, denies nausea or vomiting, continues to have LLQ pain. Passes flatus  Obtain Surgical  consultation, appreciate involvement  Prn antiemetics and pain control, avoid narcotics  Miralax prn    Atrial Fibrillation on anticoagulation with Eliquis . Denies palpitations.  Continue meds with ELiquis   Diastolic CHF, acute on chronic BNP 900s,  Last 2 D echo 2017 The cavity size was normal. Wall thickness was   increased in a pattern of mild LVH. Systolic function was mildly   reduced. The estimated ejection fraction was in the range of 45%   to 50%. Continue Lasix 40 mg daily  Obtain daily weights Monitor intake and output Careful use of IVF    Hypertension BP 119/60   Pulse 67   Continue home anti-hypertensive medications with Lisinopril, Lasix    Hyperlipidemia Continue home statins  GERD, no acute symptoms Continue PPI   DVT prophylaxis: Eliquis Code Status: DNR Family Communication: discussed with patient Disposition Plan: to home   Consultants:  General Surgery consult has been requested for r/o  Partial SBO   Procedures:   LAst 2 D echo 2017 The cavity size was normal. Wall thickness was   increased in a pattern of mild LVH. Systolic function was mildly   reduced. The estimated ejection fraction was in the range of 45%   to 50%.  Antimicrobials:    Zithromax 500 mg q 24, ROcephin 1 g q 24 h  BCx and sputum cultre pending pending   Subjective: Continues to be short of breath, but reports feeling "better since coming in" since placed on 2 L O2 [place] , acompannied by productive cough, no hemoptysis. Denies rhinorrhea.  Denies fevers, chills, night sweats  Denies any chest pain, chest wall pain or palpitations. She still reports LLQ pain, not improved since presentation . Has decreased appetite , denies nausea at this time, no Vomiting. Denies dysuria, or gross hematuria.  Denies dizziness or vertigo. Denies lower extremity swelling. No confusion was reported. Denies any vision changes, double vision or headaches.     Objective: Vitals:   04/28/17 0700  04/28/17 0800 04/28/17 0845 04/28/17 0900  BP:  (!) 129/59  119/60  Pulse: 67 69 67 67  Resp: (!) 23 (!) 24 (!) 23 (!) 27  Temp:      TempSrc:      SpO2: 97% 98% 99% 100%    Intake/Output Summary (Last 24 hours) at 04/28/2017 1014 Last data filed at 04/28/2017 7209 Gross per 24 hour  Intake 350 ml  Output 50 ml  Net 300 ml   There were no vitals filed for this visit.  Examination:  General exam: ill appearing, frail  Respiratory system:  Accessory muscle use, tachypneic, oxygen via [place] at 2L,  speaks in short sentences, bilateral rhonchi, bibasilar crackles, no wheezing Cardiovascular system: S1 & S2 heard, RRR. No JVD, murmurs, rubs, gallops or clicks. No pedal edema. Gastrointestinal system: Abdomen is nondistended, soft and tender in the LLQ  No organomegaly or masses felt. Umbilical hernia present, No guarding. Central nervous system: Alert and oriented. No focal neurological deficits. Extremities: Symmetric 5 x 5 power. Skin: No rashes, lesions or ulcers.  Psychiatry: Judgement and insight appear normal. Mood & affect at her normal      Data Reviewed: I have personally reviewed following labs and imaging studies  CBC: Recent Labs  Lab 04/27/17 1656 04/28/17 0317  WBC 17.4* 16.4*  HGB 12.5 11.7*  HCT 38.3 32.4*  MCV 95.0 100.3*  PLT 224 470   Basic Metabolic Panel: Recent Labs  Lab 04/27/17 1656 04/28/17 0317  NA 137 137  K 4.1 4.2  CL 98* 101  CO2 30 26  GLUCOSE 118* 126*  BUN 16 18  CREATININE 0.86 0.88  CALCIUM 8.9 8.4*   GFR: CrCl cannot be calculated (Unknown ideal weight.). Liver Function Tests: Recent Labs  Lab 04/27/17 1656 04/28/17 0317  AST 17 17  ALT 11* 8*  ALKPHOS 61 68  BILITOT 1.9* 1.4*  PROT 6.7 6.2*  ALBUMIN 3.6 3.2*   Recent Labs  Lab 04/27/17 1656  LIPASE 26   No results for input(s): AMMONIA in the last 168 hours. Coagulation Profile: No results for input(s): INR, PROTIME in the last 168 hours. Cardiac Enzymes: No  results for input(s): CKTOTAL, CKMB, CKMBINDEX, TROPONINI in the last 168 hours. BNP (last 3 results) No results for input(s): PROBNP in the last 8760 hours. HbA1C: No results for input(s): HGBA1C in the last 72 hours. CBG: No results for input(s): GLUCAP in the last 168 hours. Lipid Profile: No results for input(s): CHOL, HDL, LDLCALC, TRIG, CHOLHDL, LDLDIRECT in the last 72 hours. Thyroid Function Tests: No results for input(s): TSH, T4TOTAL, FREET4, T3FREE, THYROIDAB in the last 72 hours. Anemia Panel: No results for input(s): VITAMINB12, FOLATE, FERRITIN, TIBC, IRON, RETICCTPCT in the last 72 hours. Sepsis Labs: Recent Labs  Lab 04/27/17 1705 04/27/17 2046 04/28/17 0146  LATICACIDVEN 1.27 1.51 1.30    Recent Results (from the past 240 hour(s))  Respiratory Panel by PCR     Status: None   Collection Time: 04/28/17  2:51 AM  Result Value Ref Range Status   Adenovirus NOT DETECTED NOT DETECTED Final  Coronavirus 229E NOT DETECTED NOT DETECTED Final   Coronavirus HKU1 NOT DETECTED NOT DETECTED Final   Coronavirus NL63 NOT DETECTED NOT DETECTED Final   Coronavirus OC43 NOT DETECTED NOT DETECTED Final   Metapneumovirus NOT DETECTED NOT DETECTED Final   Rhinovirus / Enterovirus NOT DETECTED NOT DETECTED Final   Influenza A NOT DETECTED NOT DETECTED Final   Influenza B NOT DETECTED NOT DETECTED Final   Parainfluenza Virus 1 NOT DETECTED NOT DETECTED Final   Parainfluenza Virus 2 NOT DETECTED NOT DETECTED Final   Parainfluenza Virus 3 NOT DETECTED NOT DETECTED Final   Parainfluenza Virus 4 NOT DETECTED NOT DETECTED Final   Respiratory Syncytial Virus NOT DETECTED NOT DETECTED Final   Bordetella pertussis NOT DETECTED NOT DETECTED Final   Chlamydophila pneumoniae NOT DETECTED NOT DETECTED Final   Mycoplasma pneumoniae NOT DETECTED NOT DETECTED Final    Comment: Performed at [facility], [place] [address], [place], [place] [address]         Radiology Studies: Dg Chest  2 View  Result Date: 04/27/2017 CLINICAL DATA:  Patient with shortness of breath. EXAM: CHEST - 2 VIEW COMPARISON:  Chest radiograph 11/27/2016. FINDINGS: Monitoring leads overlie the patient. Stable enlarged cardiac and mediastinal contours. Heterogeneous opacities right mid lower lung. These are superimposed upon chronic changes. No pleural effusion or pneumothorax. Thoracic spine degenerative changes. IMPRESSION: Right lower lung heterogeneous opacities may represent atelectasis or infection. Electronically Signed   By: Lovey Newcomer M.D.   On: 04/27/2017 20:33   Ct Abdomen Pelvis W Contrast  Result Date: 04/27/2017 CLINICAL DATA:  Left lower quadrant pain with nausea and vomiting. History to hernia on the same side. Pain with urination and foul-smelling urine. EXAM: CT ABDOMEN AND PELVIS WITH CONTRAST TECHNIQUE: Multidetector CT imaging of the abdomen and pelvis was performed using the standard protocol following bolus administration of intravenous contrast. CONTRAST:  120mL ISOVUE-300 IOPAMIDOL (ISOVUE-300) INJECTION 61% COMPARISON:  CT AP 03/30/2013 and CT pelvis 12/01/2013 FINDINGS: Lower chest: Bilateral lower lobe pulmonary consolidations consistent with pneumonia. No effusion or pneumothorax. Heart is normal in size mitral annular calcifications. No pericardial effusion. Hepatobiliary: No space-occupying mass of the liver. No biliary dilatation. No evidence of cholelithiasis or cholecystitis. Pancreas: Atrophic pancreas without mass or inflammation. Spleen: 14 mm acquired cyst of the spleen. No enhancing mass or subcapsular fluid. No splenomegaly. Adrenals/Urinary Tract: Hyperplastic thickening of the adrenal glands dating back to 2015. Stable simple and complex bilateral renal cysts complex cyst in the interpolar right kidney with thin septations, unchanged overall in size measuring approximately 18 mm. No hydroureter. No focal mural thickening of the bladder. No nephrolithiasis nor obstructive  uropathy. Stomach/Bowel: Decompressed stomach with normal small bowel rotation. Mild fluid-filled small bowel distention secondary to an infraumbilical hernia containing a short segment of jejunum contributing to partial or early SBO. The small bowel leading up to the most caudal hernia measures 2.8 cm in caliber. There are 3 additional hernia sacs in a cluster about the umbilicus, the most left sided containing predominantly omental fat, the most cephalad and right-sided containing small segment of jejunum and omental fat as well as a small hernia interposed between the periumbilical and infraumbilical hernia. Vascular/Lymphatic: Moderate aortoiliac atherosclerosis without aneurysm or dissection. No adenopathy. Reproductive: Hysterectomy. No adnexal mass. No recurrence of mass at the base of the bladder. Other: No acute bowel inflammation. Left-sided colonic diverticulosis is noted. Musculoskeletal: Thoracolumbar spondylosis without worrisome osseous lesions. IMPRESSION: 1. Redemonstration of 3 periumbilical hernias containing variable degrees of  omental fat and small bowel. What appears to be causing early or partial SBO is an infraumbilical ventral hernia separate from these 3 causing dilatation of fluid-filled bowel to 2.8 cm in caliber. No evidence of incarceration. 2. 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 3. Bilateral lower lobe pulmonary consolidations consistent with pneumonia and/or atelectasis. 4. 14 mm acquired cyst in the spleen since prior. 5. Stable hyperplastic appearance of the adrenal glands dating back to 2015. 6. Simple and complex renal cysts. No nephrolithiasis nor obstructive uropathy. 7. Diffuse thoracolumbar degenerative disc disease and facet arthropathy consistent with spondylosis. No aggressive osseous lesions. Electronically Signed   By: Ashley Royalty M.D.   On: 04/27/2017 22:46        Scheduled Meds: . apixaban  5 mg Oral BID  . ferrous sulfate   325 mg Oral BID WC  . furosemide  40 mg Oral Daily  . ipratropium-albuterol  3 mL Nebulization Q6H  . lisinopril  20 mg Oral Daily  . loratadine  10 mg Oral Daily  . pantoprazole  40 mg Oral Daily  . polyethylene glycol  17 g Oral Daily   Continuous Infusions: . sodium chloride 50 mL/hr at 04/28/17 0250  . azithromycin    . cefTRIAXone (ROCEPHIN)  IV       LOS: 1 day    Time spent 40 min       Sharene Butters, MD [facility] Pager [phone]  If 7PM-7AM, please contact night-coverage [email or web address] Password [email or web address] 04/28/2017, 10:14 AM

## 2017-04-28 NOTE — Consult Note (Signed)
[facility] Surgery Consult Note  [name] 25-Jul-1925  [id number].    Requesting MD: Maudie Mercury Chief Complaint/Reason for Consult: Hernia  HPI:  Patient is a 84 year old female who presented to [facility] with cough and dyspnea and found to have a RLL PNA. PMH significant for CHF, COPD on home O2, PAF, HTN, Hx of CVA. She also complained of LLQ pain. CT showed stable umbilical hernias but slight concern for early SBO. Follow up KUB without signs of SBO. Patient currently denies abdominal pain, n/v. She had a BM that was pretty typical of her usual bowel habits and is wanting to eat. She reports that she would be concerned about having surgery again because she had some delirium the last time she had surgery about 5 years ago. Past abdominal surgeries include abdominal hysterectomy and appendectomy in the 1980s and hx of colon surgery for diverticulitis with colostomy creation and then reversal several months later with LOA.   ROS: Review of Systems  Constitutional: Positive for chills and fever.  Respiratory: Positive for cough, sputum production, shortness of breath and wheezing.   Cardiovascular: Negative for chest pain and palpitations.  Gastrointestinal: Negative for abdominal pain, constipation, diarrhea, nausea and vomiting.  Genitourinary: Negative for dysuria, frequency and urgency.  All other systems reviewed and are negative.   Family History  Problem Relation Age of Onset  . Cancer Sister        Breast  . Stroke Sister   . Heart disease Mother   . Heart disease Unknown     Past Medical History:  Diagnosis Date  . Anemia    years ago  . Arthritis    "right shoulder" (01/02/2012)  . Bladder mass   . CAP (community acquired pneumonia) 01/02/2012   Lower lobe   . CHF (congestive heart failure) ([place])   . COPD (chronic obstructive pulmonary disease) ([place])   . Difficulty sleeping   . Diverticulitis of colon with perforation 01/14/2012   That is post sigmoid resection with  [facility] pouch and end colostomy   . GERD (gastroesophageal reflux disease)    occasional  . Goiter    radioactive iodine ablation/notes 01/02/2012  . History of kidney stones    X 1  . Hypertension   . Hypothyroidism    "I have taken Synthroid before" (01/02/2012)   HAS HAD RADIOACTIVE IODINE TX 2 YR S AGO  . Intra-abdominal abscess ([place]) 01/14/2012  . PAF (paroxysmal atrial fibrillation) ([place])   . Pneumatosis of intestines 01/14/2012  . Pneumonia 01/02/2012; ? 2009  . Radiation 08/10/13-09/15/13   57 gray to vaginal/bladder mass  . SOB (shortness of breath)    'all the time" (01/02/2012)  . Stroke ([place])   . UTI (lower urinary tract infection) 01/03/2012    Past Surgical History:  Procedure Laterality Date  . ABDOMINAL HYSTERECTOMY  1980's  . APPENDECTOMY  1980's   "when they did hysterectomy" (01/02/2012)  . CATARACT EXTRACTION W/ INTRAOCULAR LENS  IMPLANT, BILATERAL  ?1990's   Bil  . COLON SURGERY    . COLOSTOMY N/A 03/12/2012   Procedure: COLOSTOMY;  Surgeon: Ralene Ok, MD;  Location: [place];  Service: General;  Laterality: N/A;  . COLOSTOMY REVISION N/A 03/12/2012   Procedure: COLON RESECTION SIGMOID ;  Surgeon: Ralene Ok, MD;  Location: [place];  Service: General;  Laterality: N/A;  . COLOSTOMY TAKEDOWN N/A 10/21/2012   Procedure: LAPAROSCOPIC COLOSTOMY TAKEDOWN AND HARTMANS ANASTOMOSIS, rigid proctoscopy;  Surgeon: Ralene Ok, MD;  Location: [facility];  Service: General;  Laterality: N/A;  . EYE SURGERY    . LYSIS OF ADHESION N/A 10/21/2012   Procedure: LYSIS OF ADHESION;  Surgeon: Ralene Ok, MD;  Location: [facility];  Service: General;  Laterality: N/A;  . TRANSURETHRAL RESECTION OF BLADDER TUMOR WITH GYRUS (TURBT-GYRUS) N/A 06/30/2013   Procedure: TRANSURETHRAL RESECTION OF BLADDER TUMOR WITH GYRUS (TURBT-GYRUS)/VAGINAL BIOPSY;  Surgeon: Ardis Hughs, MD;  Location: [facility];  Service: Urology;  Laterality: N/A;    Social History:  reports that she quit smoking  about 26 years ago. Her smoking use included cigarettes. She has a 4.20 pack-year smoking history. She has never used smokeless tobacco. She reports that she does not drink alcohol or use drugs.  Allergies:  Allergies  Allergen Reactions  . Indomethacin Anaphylaxis and Other (See Comments)    "took it fine for awhile; one day I stopped breathing and I ended up in the hospital" (01/02/2012) Pt has tolerated aspirin    Medications Prior to Admission  Medication Sig Dispense Refill  . acetaminophen (TYLENOL) 500 MG tablet Take 1,000 mg by mouth every 6 (six) hours as needed for mild pain. Reported on 07/14/2015    . albuterol (PROVENTIL HFA;VENTOLIN HFA) 108 (90 BASE) MCG/ACT inhaler Inhale 2 puffs into the lungs every 6 (six) hours as needed for wheezing.    Marland Kitchen albuterol (PROVENTIL) (2.5 MG/3ML) 0.083% nebulizer solution Take 3 mLs (2.5 mg total) by nebulization every 6 (six) hours as needed for wheezing or shortness of breath. 75 mL 12  . apixaban (ELIQUIS) 5 MG TABS tablet Take 1 tablet (5 mg total) by mouth 2 (two) times daily. 120 tablet 0  . cetirizine (ZYRTEC) 10 MG tablet Take 10 mg by mouth daily.    . cholecalciferol (VITAMIN D) 1000 units tablet Take 1,000 Units by mouth daily.    . diphenhydramine-acetaminophen (TYLENOL PM) 25-500 MG TABS tablet Take 1 tablet by mouth at bedtime as needed (sleep).     . ferrous sulfate 325 (65 FE) MG tablet Take 325 mg by mouth 2 (two) times daily with a meal.     . furosemide (LASIX) 40 MG tablet Take 1 tablet (40 mg total) by mouth daily. 30 tablet 0  . lisinopril (PRINIVIL,ZESTRIL) 20 MG tablet Take 20 mg by mouth daily.     . polyethylene glycol (MIRALAX / GLYCOLAX) packet Take 17 g by mouth daily.    . polyvinyl alcohol (LIQUIFILM TEARS) 1.4 % ophthalmic solution Place 1 drop into both eyes as needed for dry eyes.    Marland Kitchen omeprazole (PRILOSEC) 20 MG capsule Take 20 mg by mouth 2 (two) times daily.      Blood pressure (!) 114/59, pulse 71,  temperature 98.1 F (36.7 C), temperature source Oral, resp. rate (!) 27, SpO2 97 %. Physical Exam: Physical Exam  Constitutional: She is oriented to person, place, and time. She appears well-developed. She is cooperative.  Non-toxic appearance. No distress. Nasal cannula in place.  HENT:  Head: Normocephalic and atraumatic.  Right Ear: External ear normal.  Left Ear: External ear normal.  Nose: Nose normal.  Mouth/Throat: Oropharynx is clear and moist and mucous membranes are normal.  Eyes: Pupils are equal, round, and reactive to light. Conjunctivae, EOM and lids are normal. No scleral icterus.  Neck: Normal range of motion and phonation normal. Neck supple.  Cardiovascular: Normal rate and regular rhythm.  Pulses:      Radial pulses are 2+ on the right side, and 2+ on the left side.  Dorsalis pedis pulses are 2+ on the right side, and 2+ on the left side.  Pulmonary/Chest: Effort normal. No stridor. She has rhonchi in the right upper field, the right middle field, the left upper field and the left middle field. She has rales in the right lower field.  Abdominal: Soft. Bowel sounds are normal. She exhibits no distension. There is no hepatosplenomegaly. There is no tenderness. There is no rigidity, no rebound and no guarding. A hernia is present. Hernia confirmed positive in the ventral area (easily reducible without signs of incarceration ).  Musculoskeletal:  ROM grossly intact in bilateral upper and lower extremities  Neurological: She is alert and oriented to person, place, and time.  Skin: Skin is warm, dry and intact. She is not diaphoretic.  Psychiatric: She has a normal mood and affect. Her speech is normal and behavior is normal.    Results for orders placed or performed during the hospital encounter of 04/27/17 (from the past 48 hour(s))  Lipase, blood     Status: None   Collection Time: 04/27/17  4:56 PM  Result Value Ref Range   Lipase 26 11 - 51 U/L    Comment:  Performed at [facility], [place] [address], [place], [place] [address]  Comprehensive metabolic panel     Status: Abnormal   Collection Time: 04/27/17  4:56 PM  Result Value Ref Range   Sodium 137 135 - 145 mmol/L   Potassium 4.1 3.5 - 5.1 mmol/L   Chloride 98 (L) 101 - 111 mmol/L   CO2 30 22 - 32 mmol/L   Glucose, Bld 118 (H) 65 - 99 mg/dL   BUN 16 6 - 20 mg/dL   Creatinine, Ser 0.86 0.44 - 1.00 mg/dL   Calcium 8.9 8.9 - 10.3 mg/dL   Total Protein 6.7 6.5 - 8.1 g/dL   Albumin 3.6 3.5 - 5.0 g/dL   AST 17 15 - 41 U/L   ALT 11 (L) 14 - 54 U/L   Alkaline Phosphatase 61 38 - 126 U/L   Total Bilirubin 1.9 (H) 0.3 - 1.2 mg/dL   GFR calc non Af Amer 58 (L) >60 mL/min   GFR calc Af Amer >60 >60 mL/min    Comment: (NOTE) The eGFR has been calculated using the CKD EPI equation. This calculation has not been validated in all clinical situations. eGFR's persistently <60 mL/min signify possible Chronic Kidney Disease.    Anion gap 9 5 - 15    Comment: Performed at [facility] [address], [place], [place] [address]  CBC     Status: Abnormal   Collection Time: 04/27/17  4:56 PM  Result Value Ref Range   WBC 17.4 (H) 4.0 - 10.5 K/uL   RBC 4.03 3.87 - 5.11 MIL/uL   Hemoglobin 12.5 12.0 - 15.0 g/dL   HCT 38.3 36.0 - 46.0 %   MCV 95.0 78.0 - 100.0 fL   MCH 31.0 26.0 - 34.0 pg   MCHC 32.6 30.0 - 36.0 g/dL   RDW 12.3 11.5 - 15.5 %   Platelets 224 150 - 400 K/uL    Comment: Performed at [facility], [place] [address], [place], [place] [address]  I-Stat CG4 Lactic Acid, ED     Status: None   Collection Time: 04/27/17  5:05 PM  Result Value Ref Range   Lactic Acid, Venous 1.27 0.5 - 1.9 mmol/L  Brain natriuretic peptide     Status: Abnormal   Collection  Time: 04/27/17  8:38 PM  Result Value Ref Range   B Natriuretic Peptide 953.5 (H) 0.0 - 100.0 pg/mL    Comment: Performed at [facility] [address] [place], [place] [address]  I-Stat Troponin, ED (not at  [facility])     Status: None   Collection Time: 04/27/17  8:44 PM  Result Value Ref Range   Troponin i, poc 0.06 0.00 - 0.08 ng/mL   Comment 3            Comment: Due to the release kinetics of cTnI, a negative result within the first hours of the onset of symptoms does not rule out myocardial infarction with certainty. If myocardial infarction is still suspected, repeat the test at appropriate intervals.   I-Stat CG4 Lactic Acid, ED     Status: None   Collection Time: 04/27/17  8:46 PM  Result Value Ref Range   Lactic Acid, Venous 1.51 0.5 - 1.9 mmol/L  I-Stat CG4 Lactic Acid, ED     Status: None   Collection Time: 04/28/17  1:46 AM  Result Value Ref Range   Lactic Acid, Venous 1.30 0.5 - 1.9 mmol/L  Respiratory Panel by PCR     Status: None   Collection Time: 04/28/17  2:51 AM  Result Value Ref Range   Adenovirus NOT DETECTED NOT DETECTED   Coronavirus 229E NOT DETECTED NOT DETECTED   Coronavirus HKU1 NOT DETECTED NOT DETECTED   Coronavirus NL63 NOT DETECTED NOT DETECTED   Coronavirus OC43 NOT DETECTED NOT DETECTED   Metapneumovirus NOT DETECTED NOT DETECTED   Rhinovirus / Enterovirus NOT DETECTED NOT DETECTED   Influenza A NOT DETECTED NOT DETECTED   Influenza B NOT DETECTED NOT DETECTED   Parainfluenza Virus 1 NOT DETECTED NOT DETECTED   Parainfluenza Virus 2 NOT DETECTED NOT DETECTED   Parainfluenza Virus 3 NOT DETECTED NOT DETECTED   Parainfluenza Virus 4 NOT DETECTED NOT DETECTED   Respiratory Syncytial Virus NOT DETECTED NOT DETECTED   Bordetella pertussis NOT DETECTED NOT DETECTED   Chlamydophila pneumoniae NOT DETECTED NOT DETECTED   Mycoplasma pneumoniae NOT DETECTED NOT DETECTED    Comment: Performed at [facility], [place] [address], [place], [place] [address]  CBC     Status: Abnormal   Collection Time: 04/28/17  3:17 AM  Result Value Ref Range   WBC 16.4 (H) 4.0 - 10.5 K/uL   RBC 3.23 (L) 3.87 - 5.11 MIL/uL   Hemoglobin 11.7 (L) 12.0 - 15.0 g/dL   HCT 32.4  (L) 36.0 - 46.0 %   MCV 100.3 (H) 78.0 - 100.0 fL   MCH 36.2 (H) 26.0 - 34.0 pg   MCHC 36.1 (H) 30.0 - 36.0 g/dL   RDW 12.7 11.5 - 15.5 %   Platelets 215 150 - 400 K/uL    Comment: Performed at [facility], [place] [address], [place],  [address]  Comprehensive metabolic panel     Status: Abnormal   Collection Time: 04/28/17  3:17 AM  Result Value Ref Range   Sodium 137 135 - 145 mmol/L   Potassium 4.2 3.5 - 5.1 mmol/L   Chloride 101 101 - 111 mmol/L   CO2 26 22 - 32 mmol/L   Glucose, Bld 126 (H) 65 - 99 mg/dL   BUN 18 6 - 20 mg/dL   Creatinine, Ser 0.88 0.44 - 1.00 mg/dL   Calcium 8.4 (L) 8.9 - 10.3 mg/dL   Total Protein 6.2 (L) 6.5 - 8.1  g/dL   Albumin 3.2 (L) 3.5 - 5.0 g/dL   AST 17 15 - 41 U/L   ALT 8 (L) 14 - 54 U/L   Alkaline Phosphatase 68 38 - 126 U/L   Total Bilirubin 1.4 (H) 0.3 - 1.2 mg/dL   GFR calc non Af Amer 57 (L) >60 mL/min   GFR calc Af Amer >60 >60 mL/min    Comment: (NOTE) The eGFR has been calculated using the CKD EPI equation. This calculation has not been validated in all clinical situations. eGFR's persistently <60 mL/min signify possible Chronic Kidney Disease.    Anion gap 10 5 - 15    Comment: Performed at [facility] [address] [place], [place] [address]  Urinalysis, Routine w reflex microscopic     Status: Abnormal   Collection Time: 04/28/17  9:23 AM  Result Value Ref Range   Color, Urine AMBER (A) YELLOW    Comment: BIOCHEMICALS MAY BE AFFECTED BY COLOR   APPearance HAZY (A) CLEAR   Specific Gravity, Urine >1.046 (H) 1.005 - 1.030   pH 5.0 5.0 - 8.0   Glucose, UA NEGATIVE NEGATIVE mg/dL   Hgb urine dipstick NEGATIVE NEGATIVE   Bilirubin Urine NEGATIVE NEGATIVE   Ketones, ur NEGATIVE NEGATIVE mg/dL   Protein, ur 100 (A) NEGATIVE mg/dL   Nitrite NEGATIVE NEGATIVE   Leukocytes, UA TRACE (A) NEGATIVE   RBC / HPF 0-5 0 - 5 RBC/hpf   WBC, UA 6-10 0 - 5 WBC/hpf   Bacteria, UA RARE (A) NONE SEEN   Squamous Epithelial /  LPF 0-5 0 - 5    Comment: Please note change in reference range.   Mucus PRESENT     Comment: Performed at [facility], [place] [address] [place], [place] [address]   Dg Chest 2 View  Result Date: 04/27/2017 CLINICAL DATA:  Patient with shortness of breath. EXAM: CHEST - 2 VIEW COMPARISON:  Chest radiograph 11/27/2016. FINDINGS: Monitoring leads overlie the patient. Stable enlarged cardiac and mediastinal contours. Heterogeneous opacities right mid lower lung. These are superimposed upon chronic changes. No pleural effusion or pneumothorax. Thoracic spine degenerative changes. IMPRESSION: Right lower lung heterogeneous opacities may represent atelectasis or infection. Electronically Signed   By: Lovey Newcomer M.D.   On: 04/27/2017 20:33   Dg Abd 1 View  Result Date: 04/28/2017 CLINICAL DATA:  Left lower quadrant pain.  Small bowel obstruction. EXAM: ABDOMEN - 1 VIEW COMPARISON:  CT scan April 27, 2017 FINDINGS: Lung bases are normal. No free air, portal venous gas, pneumatosis. No evidence of small bowel obstruction on this study. IMPRESSION: No evidence of small bowel obstruction on this study. There is a paucity of bowel gas however. Electronically Signed   By: Dorise Bullion III M.D   On: 04/28/2017 10:55   Ct Abdomen Pelvis W Contrast  Result Date: 04/27/2017 CLINICAL DATA:  Left lower quadrant pain with nausea and vomiting. History to hernia on the same side. Pain with urination and foul-smelling urine. EXAM: CT ABDOMEN AND PELVIS WITH CONTRAST TECHNIQUE: Multidetector CT imaging of the abdomen and pelvis was performed using the standard protocol following bolus administration of intravenous contrast. CONTRAST:  163m ISOVUE-300 IOPAMIDOL (ISOVUE-300) INJECTION 61% COMPARISON:  CT AP 03/30/2013 and CT pelvis 12/01/2013 FINDINGS: Lower chest: Bilateral lower lobe pulmonary consolidations consistent with pneumonia. No effusion or pneumothorax. Heart is normal in size mitral annular  calcifications. No pericardial effusion. Hepatobiliary: No space-occupying mass of the liver. No biliary dilatation. No evidence  of cholelithiasis or cholecystitis. Pancreas: Atrophic pancreas without mass or inflammation. Spleen: 14 mm acquired cyst of the spleen. No enhancing mass or subcapsular fluid. No splenomegaly. Adrenals/Urinary Tract: Hyperplastic thickening of the adrenal glands dating back to 2015. Stable simple and complex bilateral renal cysts complex cyst in the interpolar right kidney with thin septations, unchanged overall in size measuring approximately 18 mm. No hydroureter. No focal mural thickening of the bladder. No nephrolithiasis nor obstructive uropathy. Stomach/Bowel: Decompressed stomach with normal small bowel rotation. Mild fluid-filled small bowel distention secondary to an infraumbilical hernia containing a short segment of jejunum contributing to partial or early SBO. The small bowel leading up to the most caudal hernia measures 2.8 cm in caliber. There are 3 additional hernia sacs in a cluster about the umbilicus, the most left sided containing predominantly omental fat, the most cephalad and right-sided containing small segment of jejunum and omental fat as well as a small hernia interposed between the periumbilical and infraumbilical hernia. Vascular/Lymphatic: Moderate aortoiliac atherosclerosis without aneurysm or dissection. No adenopathy. Reproductive: Hysterectomy. No adnexal mass. No recurrence of mass at the base of the bladder. Other: No acute bowel inflammation. Left-sided colonic diverticulosis is noted. Musculoskeletal: Thoracolumbar spondylosis without worrisome osseous lesions. IMPRESSION: 1. Redemonstration of 3 periumbilical hernias containing variable degrees of omental fat and small bowel. What appears to be causing early or partial SBO is an infraumbilical ventral hernia separate from these 3 causing dilatation of fluid-filled bowel to 2.8 cm in caliber. No  evidence of incarceration. 2. 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 3. Bilateral lower lobe pulmonary consolidations consistent with pneumonia and/or atelectasis. 4. 14 mm acquired cyst in the spleen since prior. 5. Stable hyperplastic appearance of the adrenal glands dating back to 2015. 6. Simple and complex renal cysts. No nephrolithiasis nor obstructive uropathy. 7. Diffuse thoracolumbar degenerative disc disease and facet arthropathy consistent with spondylosis. No aggressive osseous lesions. Electronically Signed   By: Ashley Royalty M.D.   On: 04/27/2017 22:46      Assessment/Plan COPD on home oxygen CHF PAF - on Eliquis HTN Hypothyroidism Hx of CVA RLL PNA  Stable ventral hernias - some concern for early SBO on CT, follow up KUB without obstruction and patient having bowel function - easily reducible without signs of obstruction  - ?abdominal binder with increased coughing due to PNA, will discuss with MD - risk stratification and clearance just as a precautionary measure but I do not see indication for surgical intervention at this time  FEN: NPO, IVF VTE: SCDs ID: rocephin and azithromycin 4/27>>  Brigid Re, [facility] Surgery 04/28/2017, 11:14 AM Pager: [phone] Consults: [phone] Mon-Fri 7:00 am-4:30 pm Sat-Sun 7:00 am-11:30 am

## 2017-04-28 NOTE — Progress Notes (Signed)
ANTICOAGULATION CONSULT NOTE - Initial Consult  Pharmacy Consult for Eliquis Indication: atrial fibrillation  Allergies  Allergen Reactions  . Indomethacin Anaphylaxis and Other (See Comments)    "took it fine for awhile; one day I stopped breathing and I ended up in the hospital" (01/02/2012) Pt has tolerated aspirin    Patient Measurements:    Vital Signs: Temp: 99.5 F (37.5 C) (04/27 2245) Temp Source: Oral (04/27 2245) BP: 100/89 (04/28 0000) Pulse Rate: 76 (04/28 0000)  Labs: Recent Labs    04/27/17 1656  HGB 12.5  HCT 38.3  PLT 224  CREATININE 0.86    CrCl cannot be calculated (Unknown ideal weight.).   Medical History: Past Medical History:  Diagnosis Date  . Anemia    years ago  . Arthritis    "right shoulder" (01/02/2012)  . Bladder mass   . CAP (community acquired pneumonia) 01/02/2012   Lower lobe   . CHF (congestive heart failure) ([place])   . COPD (chronic obstructive pulmonary disease) ([place])   . Difficulty sleeping   . Diverticulitis of colon with perforation 01/14/2012   That is post sigmoid resection with [facility] pouch and end colostomy   . GERD (gastroesophageal reflux disease)    occasional  . Goiter    radioactive iodine ablation/notes 01/02/2012  . History of kidney stones    X 1  . Hypertension   . Hypothyroidism    "I have taken Synthroid before" (01/02/2012)   HAS HAD RADIOACTIVE IODINE TX 2 YR S AGO  . Intra-abdominal abscess ([place]) 01/14/2012  . PAF (paroxysmal atrial fibrillation) ([place])   . Pneumatosis of intestines 01/14/2012  . Pneumonia 01/02/2012; ? 2009  . Radiation 08/10/13-09/15/13   57 gray to vaginal/bladder mass  . SOB (shortness of breath)    'all the time" (01/02/2012)  . Stroke ([place])   . UTI (lower urinary tract infection) 01/03/2012    Medications:  No current facility-administered medications on file prior to encounter.    Current Outpatient Medications on File Prior to Encounter  Medication Sig Dispense Refill  .  acetaminophen (TYLENOL) 500 MG tablet Take 1,000 mg by mouth every 6 (six) hours as needed for mild pain. Reported on 07/14/2015    . albuterol (PROVENTIL HFA;VENTOLIN HFA) 108 (90 BASE) MCG/ACT inhaler Inhale 2 puffs into the lungs every 6 (six) hours as needed for wheezing.    Marland Kitchen albuterol (PROVENTIL) (2.5 MG/3ML) 0.083% nebulizer solution Take 3 mLs (2.5 mg total) by nebulization every 6 (six) hours as needed for wheezing or shortness of breath. 75 mL 12  . apixaban (ELIQUIS) 5 MG TABS tablet Take 1 tablet (5 mg total) by mouth 2 (two) times daily. 120 tablet 0  . cetirizine (ZYRTEC) 10 MG tablet Take 10 mg by mouth daily.    . cholecalciferol (VITAMIN D) 1000 units tablet Take 1,000 Units by mouth daily.    . diphenhydramine-acetaminophen (TYLENOL PM) 25-500 MG TABS tablet Take 1 tablet by mouth at bedtime as needed (sleep).     . ferrous sulfate 325 (65 FE) MG tablet Take 325 mg by mouth 2 (two) times daily with a meal.     . furosemide (LASIX) 40 MG tablet Take 1 tablet (40 mg total) by mouth daily. 30 tablet 0  . lisinopril (PRINIVIL,ZESTRIL) 20 MG tablet Take 20 mg by mouth daily.     . polyethylene glycol (MIRALAX / GLYCOLAX) packet Take 17 g by mouth daily.    . polyvinyl alcohol (LIQUIFILM TEARS) 1.4 % ophthalmic solution  Place 1 drop into both eyes as needed for dry eyes.    Marland Kitchen omeprazole (PRILOSEC) 20 MG capsule Take 20 mg by mouth 2 (two) times daily.       Assessment: 84 y.o. female admitted with cough/PNA, h/o Afib, to continue Eliquis  Plan:  Eliquis 5 mg BID  Krystie Leiter, [facility] 04/28/2017,1:18 AM

## 2017-04-29 DIAGNOSIS — J9621 Acute and chronic respiratory failure with hypoxia: Secondary | ICD-10-CM

## 2017-04-29 LAB — CBC WITH DIFFERENTIAL/PLATELET
Basophils Absolute: 0 10*3/uL (ref 0.0–0.1)
Basophils Relative: 0 %
EOS ABS: 0.1 10*3/uL (ref 0.0–0.7)
EOS PCT: 0 %
HCT: 34.8 % — ABNORMAL LOW (ref 36.0–46.0)
Hemoglobin: 11.8 g/dL — ABNORMAL LOW (ref 12.0–15.0)
LYMPHS ABS: 1 10*3/uL (ref 0.7–4.0)
LYMPHS PCT: 7 %
MCH: 33.7 pg (ref 26.0–34.0)
MCHC: 33.9 g/dL (ref 30.0–36.0)
MCV: 99.4 fL (ref 78.0–100.0)
MONO ABS: 1.2 10*3/uL — AB (ref 0.1–1.0)
Monocytes Relative: 9 %
Neutro Abs: 11.8 10*3/uL — ABNORMAL HIGH (ref 1.7–7.7)
Neutrophils Relative %: 84 %
PLATELETS: 179 10*3/uL (ref 150–400)
RBC: 3.5 MIL/uL — AB (ref 3.87–5.11)
RDW: 12.6 % (ref 11.5–15.5)
WBC: 14.1 10*3/uL — AB (ref 4.0–10.5)

## 2017-04-29 LAB — BASIC METABOLIC PANEL
Anion gap: 10 (ref 5–15)
BUN: 24 mg/dL — AB (ref 6–20)
CHLORIDE: 99 mmol/L — AB (ref 101–111)
CO2: 27 mmol/L (ref 22–32)
Calcium: 8.4 mg/dL — ABNORMAL LOW (ref 8.9–10.3)
Creatinine, Ser: 1.22 mg/dL — ABNORMAL HIGH (ref 0.44–1.00)
GFR calc Af Amer: 44 mL/min — ABNORMAL LOW (ref >60)
GFR, EST NON AFRICAN AMERICAN: 38 mL/min — AB (ref >60)
GLUCOSE: 115 mg/dL — AB (ref 65–99)
Potassium: 4.2 mmol/L (ref 3.5–5.1)
SODIUM: 136 mmol/L (ref 135–145)

## 2017-04-29 LAB — URINE CULTURE

## 2017-04-29 MED ORDER — DOCUSATE SODIUM 100 MG PO CAPS
100.0000 mg | ORAL_CAPSULE | Freq: Two times a day (BID) | ORAL | Status: DC
  Administered 2017-04-29 – 2017-04-30 (×3): 100 mg via ORAL
  Filled 2017-04-29 (×4): qty 1

## 2017-04-29 MED ORDER — FUROSEMIDE 40 MG PO TABS
40.0000 mg | ORAL_TABLET | Freq: Every day | ORAL | Status: DC
  Administered 2017-04-29 – 2017-05-01 (×3): 40 mg via ORAL
  Filled 2017-04-29 (×3): qty 1

## 2017-04-29 MED ORDER — ONDANSETRON HCL 4 MG/2ML IJ SOLN
4.0000 mg | Freq: Three times a day (TID) | INTRAMUSCULAR | Status: DC | PRN
  Administered 2017-04-29: 4 mg via INTRAVENOUS
  Filled 2017-04-29: qty 2

## 2017-04-29 NOTE — Final Consult Note (Signed)
[facility] Progress Note     Subjective: CC: pneumonia Patient reports some nausea this AM, but is passing flatus and tolerating CLD. No abdominal pain, unless someone is pushing on hernia. No BM since yesterday. Takes daily miralax at home. Denies labored breathing but is coughing a lot.  UOP good. VSS.   Objective: Vital signs in last 24 hours: Temp:  [97.8 F (36.6 C)-98.3 F (36.8 C)] 98.3 F (36.8 C) (04/29 0012) Pulse Rate:  [67-82] 69 (04/29 0012) Resp:  [24-27] 24 (04/28 1647) BP: (109-125)/(54-60) 117/54 (04/29 0012) SpO2:  [95 %-100 %] 95 % (04/29 0843) Weight:  [74.2 kg (163 lb 9.3 oz)] 74.2 kg (163 lb 9.3 oz) (04/28 1100) Last BM Date: 04/28/17  Intake/Output from previous day: 04/28 0701 - 04/29 0700 In: 1045.5 [P.O.:238; I.V.:457.5; IV Piggyback:350] Out: 475 [Urine:475] Intake/Output this shift: No intake/output data recorded.  PE: Gen:  Alert, NAD, pleasant Card:  Regular rate and rhythm, pedal pulses 2+ BL Pulm:  Normal effort, wheezing bilaterally, [place]var[place] present Abd: Soft, mildly TTP over hernia but hernia is reducible, non-distended, bowel sounds present, no HSM Skin: warm and dry, no rashes  Psych: A&Ox3   Lab Results:  Recent Labs    04/28/17 0317 04/29/17 0212  WBC 16.4* 14.1*  HGB 11.7* 11.8*  HCT 32.4* 34.8*  PLT 215 179   BMET Recent Labs    04/28/17 0317 04/29/17 0212  NA 137 136  K 4.2 4.2  CL 101 99*  CO2 26 27  GLUCOSE 126* 115*  BUN 18 24*  CREATININE 0.88 1.22*  CALCIUM 8.4* 8.4*   PT/INR No results for input(s): LABPROT, INR in the last 72 hours. CMP     Component Value Date/Time   NA 136 04/29/2017 0212   NA 144 09/02/2014 1245   K 4.2 04/29/2017 0212   K 4.2 09/02/2014 1245   CL 99 (L) 04/29/2017 0212   CO2 27 04/29/2017 0212   CO2 29 09/02/2014 1245   GLUCOSE 115 (H) 04/29/2017 0212   GLUCOSE 95 09/02/2014 1245   BUN 24 (H) 04/29/2017 0212   BUN 21.8 09/02/2014 1245   CREATININE 1.22 (H)  04/29/2017 0212   CREATININE 0.75 05/09/2015 1157   CREATININE 1.1 09/02/2014 1245   CALCIUM 8.4 (L) 04/29/2017 0212   CALCIUM 9.4 09/02/2014 1245   PROT 6.2 (L) 04/28/2017 0317   PROT 7.2 09/02/2014 1245   ALBUMIN 3.2 (L) 04/28/2017 0317   ALBUMIN 3.9 09/02/2014 1245   AST 17 04/28/2017 0317   AST 15 09/02/2014 1245   ALT 8 (L) 04/28/2017 0317   ALT 10 09/02/2014 1245   ALKPHOS 68 04/28/2017 0317   ALKPHOS 86 09/02/2014 1245   BILITOT 1.4 (H) 04/28/2017 0317   BILITOT 0.52 09/02/2014 1245   GFRNONAA 38 (L) 04/29/2017 0212   GFRAA 44 (L) 04/29/2017 0212   Lipase     Component Value Date/Time   LIPASE 26 04/27/2017 1656       Studies/Results: Dg Chest 2 View  Result Date: 04/27/2017 CLINICAL DATA:  Patient with shortness of breath. EXAM: CHEST - 2 VIEW COMPARISON:  Chest radiograph 11/27/2016. FINDINGS: Monitoring leads overlie the patient. Stable enlarged cardiac and mediastinal contours. Heterogeneous opacities right mid lower lung. These are superimposed upon chronic changes. No pleural effusion or pneumothorax. Thoracic spine degenerative changes. IMPRESSION: Right lower lung heterogeneous opacities may represent atelectasis or infection. Electronically Signed   By: Lovey Newcomer M.D.   On: 04/27/2017 20:33   Dg  Abd 1 View  Result Date: 04/28/2017 CLINICAL DATA:  Left lower quadrant pain.  Small bowel obstruction. EXAM: ABDOMEN - 1 VIEW COMPARISON:  CT scan April 27, 2017 FINDINGS: Lung bases are normal. No free air, portal venous gas, pneumatosis. No evidence of small bowel obstruction on this study. IMPRESSION: No evidence of small bowel obstruction on this study. There is a paucity of bowel gas however. Electronically Signed   By: Dorise Bullion III M.D   On: 04/28/2017 10:55   Ct Abdomen Pelvis W Contrast  Result Date: 04/27/2017 CLINICAL DATA:  Left lower quadrant pain with nausea and vomiting. History to hernia on the same side. Pain with urination and foul-smelling  urine. EXAM: CT ABDOMEN AND PELVIS WITH CONTRAST TECHNIQUE: Multidetector CT imaging of the abdomen and pelvis was performed using the standard protocol following bolus administration of intravenous contrast. CONTRAST:  121mL ISOVUE-300 IOPAMIDOL (ISOVUE-300) INJECTION 61% COMPARISON:  CT AP 03/30/2013 and CT pelvis 12/01/2013 FINDINGS: Lower chest: Bilateral lower lobe pulmonary consolidations consistent with pneumonia. No effusion or pneumothorax. Heart is normal in size mitral annular calcifications. No pericardial effusion. Hepatobiliary: No space-occupying mass of the liver. No biliary dilatation. No evidence of cholelithiasis or cholecystitis. Pancreas: Atrophic pancreas without mass or inflammation. Spleen: 14 mm acquired cyst of the spleen. No enhancing mass or subcapsular fluid. No splenomegaly. Adrenals/Urinary Tract: Hyperplastic thickening of the adrenal glands dating back to 2015. Stable simple and complex bilateral renal cysts complex cyst in the interpolar right kidney with thin septations, unchanged overall in size measuring approximately 18 mm. No hydroureter. No focal mural thickening of the bladder. No nephrolithiasis nor obstructive uropathy. Stomach/Bowel: Decompressed stomach with normal small bowel rotation. Mild fluid-filled small bowel distention secondary to an infraumbilical hernia containing a short segment of jejunum contributing to partial or early SBO. The small bowel leading up to the most caudal hernia measures 2.8 cm in caliber. There are 3 additional hernia sacs in a cluster about the umbilicus, the most left sided containing predominantly omental fat, the most cephalad and right-sided containing small segment of jejunum and omental fat as well as a small hernia interposed between the periumbilical and infraumbilical hernia. Vascular/Lymphatic: Moderate aortoiliac atherosclerosis without aneurysm or dissection. No adenopathy. Reproductive: Hysterectomy. No adnexal mass. No  recurrence of mass at the base of the bladder. Other: No acute bowel inflammation. Left-sided colonic diverticulosis is noted. Musculoskeletal: Thoracolumbar spondylosis without worrisome osseous lesions. IMPRESSION: 1. Redemonstration of 3 periumbilical hernias containing variable degrees of omental fat and small bowel. What appears to be causing early or partial SBO is an infraumbilical ventral hernia separate from these 3 causing dilatation of fluid-filled bowel to 2.8 cm in caliber. No evidence of incarceration. 2. 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 3. Bilateral lower lobe pulmonary consolidations consistent with pneumonia and/or atelectasis. 4. 14 mm acquired cyst in the spleen since prior. 5. Stable hyperplastic appearance of the adrenal glands dating back to 2015. 6. Simple and complex renal cysts. No nephrolithiasis nor obstructive uropathy. 7. Diffuse thoracolumbar degenerative disc disease and facet arthropathy consistent with spondylosis. No aggressive osseous lesions. Electronically Signed   By: Ashley Royalty M.D.   On: 04/27/2017 22:46    Anti-infectives: Anti-infectives (From admission, onward)   Start     Dose/Rate Route Frequency Ordered Stop   04/28/17 0230  cefTRIAXone (ROCEPHIN) 1 g in sodium chloride 0.9 % 100 mL IVPB     1 g 200 mL/hr over 30 Minutes Intravenous  Every 24 hours 04/28/17 0215 05/05/17 0214   04/28/17 0230  azithromycin (ZITHROMAX) 500 mg in sodium chloride 0.9 % 250 mL IVPB     500 mg 250 mL/hr over 60 Minutes Intravenous Every 24 hours 04/28/17 0215 05/05/17 0214   04/27/17 2115  cefTRIAXone (ROCEPHIN) 1 g in sodium chloride 0.9 % 100 mL IVPB     1 g 200 mL/hr over 30 Minutes Intravenous  Once 04/27/17 2113 04/27/17 2332   04/27/17 2115  azithromycin (ZITHROMAX) 500 mg in sodium chloride 0.9 % 250 mL IVPB     500 mg 250 mL/hr over 60 Minutes Intravenous  Once 04/27/17 2113 04/28/17 0109       Assessment/Plan COPD on home  oxygen CHF PAF - on Eliquis HTN Hypothyroidism Hx of CVA RLL PNA  Stable ventral hernias - some concern for early SBO on CT, follow up KUB yesterday without obstruction and patient having bowel function - easily reducible without signs of obstruction  - abdominal binder with increased coughing due to PNA - I do not see any indication for surgical intervention at this time - advance to FLD then advance diet as tolerated, add colace to bowel regimen  FEN: FLD VTE: SCDs ID: [email or web address] and azithromycin 4/27>>  Ventral hernia is stable and reducible. We will sign off at this time, please call with questions or concerns or if change in condition related to hernia.   LOS: 2 days    Brigid Re , [facility] 04/29/2017, 8:56 AM Pager: [phone] Consults: [phone] Mon-Fri 7:00 am-4:30 pm Sat-Sun 7:00 am-11:30 am

## 2017-04-29 NOTE — Progress Notes (Signed)
PROGRESS NOTE    [name]  CBS:[id number] DOB: [birth year] DOA: 04/27/2017 PCP: Leonard Downing, MD    Brief Narrative by " Sharene Butters" : 84 year old female with hypertension, paroxysmal atrial fibrillation, congestive heart failure, history of CVA, COPD, who presented with productive yellow sputum, dyspnea for several days, diagnosed with acute respiratory failure with hypoxia, in the setting of  right lower lobe  Pneumonia.On presentation, her white count was 17.4, creatinine stable, troponin negative, lactic negative, EKG without signs of STEMI, but BNP was in the 900s.  No IV fluids were initiated, received IV antibiotic with Zithromax and Rocephin.  In addition, the patient complained of left lower quadrant abdominal pain, with CT showing stable umbilical hernia, but possible early small bowel obstruction, without signs of diverticulitis.  Surgery was consulted.      Assessment & Plan:   Principal Problem:   Acute on chronic respiratory failure with hypoxia (HCC) Active Problems:   COPD (chronic obstructive pulmonary disease) (HCC)   Hypertension   H/O: hypothyroidism   HLD (hyperlipidemia)   Essential hypertension   Pneumonia   CAP (community acquired pneumonia)   LLQ abdominal pain   GERD (gastroesophageal reflux disease)    Acute Respiratory Failure with Hypoxia likely due to RLL pneumonia   Continue with ceftriaxone and Azithromycin day 2.  WBC trending down 16--14 Respiratory panel negative.  Blood culture pending.  Dyspnea improving. On 2 L oxygen.   Suspected partial SBO per CT abdomen and pelvis  Appreciate Surgery evaluation.  Diet advanced to full liquid.  Had BM yesterday.   AKI; hold lisinopril. Repeat labs in am.   Atrial Fibrillation on anticoagulation with Eliquis Not on rate controlled medications.   Diastolic CHF, acute on chronic  Hold , lisinopril due to AKI.  Would continue for now lasix. Repeat Bmet in am.   Hypertension Stable.     Hyperlipidemia Continue home statins  GERD, no acute symptoms Continue PPI    DVT prophylaxis: Eliquis  Code Status: DNR Family Communication: care discussed with patient.  Disposition Plan: to be determine, need PT.    Consultants:   Surgery    Procedures:   none   Antimicrobials:  Ceftriaxone 4-28  Azithromycin 4-28   Subjective:   Objective: Vitals:   04/28/17 1340 04/28/17 1647 04/28/17 2000 04/29/17 0012  BP:  (!) 109/59  (!) 117/54  Pulse:  82  69  Resp:  (!) 24    Temp:  97.8 F (36.6 C)  98.3 F (36.8 C)  TempSrc:  Oral  Oral  SpO2: 98% 96% 96%   Weight:      Height:        Intake/Output Summary (Last 24 hours) at 04/29/2017 0711 Last data filed at 04/29/2017 0640 Gross per 24 hour  Intake 1045.5 ml  Output 475 ml  Net 570.5 ml   Filed Weights   04/28/17 1100  Weight: 74.2 kg (163 lb 9.3 oz)    Examination:  General exam: Appears calm and comfortable  Respiratory system: Clear to auscultation. Respiratory effort normal. Cardiovascular system: S1 & S2 heard, RRR. No JVD, murmurs, rubs, gallops or clicks. No pedal edema. Gastrointestinal system: Abdomen is nondistended, soft and nontender. No organomegaly or masses felt. Normal bowel sounds heard. Central nervous system: Alert and oriented. No focal neurological deficits. Extremities: Symmetric 5 x 5 power. Skin: No rashes, lesions or ulcers Psychiatry: Judgement and insight appear normal. Mood & affect appropriate.     Data Reviewed: I have personally  reviewed following labs and imaging studies  CBC: Recent Labs  Lab 04/27/17 1656 04/28/17 0317 04/29/17 0212  WBC 17.4* 16.4* 14.1*  NEUTROABS  --   --  11.8*  HGB 12.5 11.7* 11.8*  HCT 38.3 32.4* 34.8*  MCV 95.0 100.3* 99.4  PLT 224 215 570   Basic Metabolic Panel: Recent Labs  Lab 04/27/17 1656 04/28/17 0317 04/29/17 0212  NA 137 137 136  K 4.1 4.2 4.2  CL 98* 101 99*  CO2 30 26 27   GLUCOSE 118* 126* 115*   BUN 16 18 24*  CREATININE 0.86 0.88 1.22*  CALCIUM 8.9 8.4* 8.4*   GFR: Estimated Creatinine Clearance: 28.1 mL/min (A) (by C-G formula based on SCr of 1.22 mg/dL (H)). Liver Function Tests: Recent Labs  Lab 04/27/17 1656 04/28/17 0317  AST 17 17  ALT 11* 8*  ALKPHOS 61 68  BILITOT 1.9* 1.4*  PROT 6.7 6.2*  ALBUMIN 3.6 3.2*   Recent Labs  Lab 04/27/17 1656  LIPASE 26   No results for input(s): AMMONIA in the last 168 hours. Coagulation Profile: No results for input(s): INR, PROTIME in the last 168 hours. Cardiac Enzymes: No results for input(s): CKTOTAL, CKMB, CKMBINDEX, TROPONINI in the last 168 hours. BNP (last 3 results) No results for input(s): PROBNP in the last 8760 hours. HbA1C: No results for input(s): HGBA1C in the last 72 hours. CBG: No results for input(s): GLUCAP in the last 168 hours. Lipid Profile: No results for input(s): CHOL, HDL, LDLCALC, TRIG, CHOLHDL, LDLDIRECT in the last 72 hours. Thyroid Function Tests: No results for input(s): TSH, T4TOTAL, FREET4, T3FREE, THYROIDAB in the last 72 hours. Anemia Panel: No results for input(s): VITAMINB12, FOLATE, FERRITIN, TIBC, IRON, RETICCTPCT in the last 72 hours. Sepsis Labs: Recent Labs  Lab 04/27/17 1705 04/27/17 2046 04/28/17 0146  LATICACIDVEN 1.27 1.51 1.30    Recent Results (from the past 240 hour(s))  Respiratory Panel by PCR     Status: None   Collection Time: 04/28/17  2:51 AM  Result Value Ref Range Status   Adenovirus NOT DETECTED NOT DETECTED Final   Coronavirus 229E NOT DETECTED NOT DETECTED Final   Coronavirus HKU1 NOT DETECTED NOT DETECTED Final   Coronavirus NL63 NOT DETECTED NOT DETECTED Final   Coronavirus OC43 NOT DETECTED NOT DETECTED Final   Metapneumovirus NOT DETECTED NOT DETECTED Final   Rhinovirus / Enterovirus NOT DETECTED NOT DETECTED Final   Influenza A NOT DETECTED NOT DETECTED Final   Influenza B NOT DETECTED NOT DETECTED Final   Parainfluenza Virus 1 NOT DETECTED  NOT DETECTED Final   Parainfluenza Virus 2 NOT DETECTED NOT DETECTED Final   Parainfluenza Virus 3 NOT DETECTED NOT DETECTED Final   Parainfluenza Virus 4 NOT DETECTED NOT DETECTED Final   Respiratory Syncytial Virus NOT DETECTED NOT DETECTED Final   Bordetella pertussis NOT DETECTED NOT DETECTED Final   Chlamydophila pneumoniae NOT DETECTED NOT DETECTED Final   Mycoplasma pneumoniae NOT DETECTED NOT DETECTED Final    Comment: Performed at [facility], [place] [address], [place], [place] [address]         Radiology Studies: Dg Chest 2 View  Result Date: 04/27/2017 CLINICAL DATA:  Patient with shortness of breath. EXAM: CHEST - 2 VIEW COMPARISON:  Chest radiograph 11/27/2016. FINDINGS: Monitoring leads overlie the patient. Stable enlarged cardiac and mediastinal contours. Heterogeneous opacities right mid lower lung. These are superimposed upon chronic changes. No pleural effusion or pneumothorax. Thoracic spine degenerative changes. IMPRESSION: Right lower lung  heterogeneous opacities may represent atelectasis or infection. Electronically Signed   By: Lovey Newcomer M.D.   On: 04/27/2017 20:33   Dg Abd 1 View  Result Date: 04/28/2017 CLINICAL DATA:  Left lower quadrant pain.  Small bowel obstruction. EXAM: ABDOMEN - 1 VIEW COMPARISON:  CT scan April 27, 2017 FINDINGS: Lung bases are normal. No free air, portal venous gas, pneumatosis. No evidence of small bowel obstruction on this study. IMPRESSION: No evidence of small bowel obstruction on this study. There is a paucity of bowel gas however. Electronically Signed   By: Dorise Bullion III M.D   On: 04/28/2017 10:55   Ct Abdomen Pelvis W Contrast  Result Date: 04/27/2017 CLINICAL DATA:  Left lower quadrant pain with nausea and vomiting. History to hernia on the same side. Pain with urination and foul-smelling urine. EXAM: CT ABDOMEN AND PELVIS WITH CONTRAST TECHNIQUE: Multidetector CT imaging of the abdomen and pelvis was performed using  the standard protocol following bolus administration of intravenous contrast. CONTRAST:  161mL ISOVUE-300 IOPAMIDOL (ISOVUE-300) INJECTION 61% COMPARISON:  CT AP 03/30/2013 and CT pelvis 12/01/2013 FINDINGS: Lower chest: Bilateral lower lobe pulmonary consolidations consistent with pneumonia. No effusion or pneumothorax. Heart is normal in size mitral annular calcifications. No pericardial effusion. Hepatobiliary: No space-occupying mass of the liver. No biliary dilatation. No evidence of cholelithiasis or cholecystitis. Pancreas: Atrophic pancreas without mass or inflammation. Spleen: 14 mm acquired cyst of the spleen. No enhancing mass or subcapsular fluid. No splenomegaly. Adrenals/Urinary Tract: Hyperplastic thickening of the adrenal glands dating back to 2015. Stable simple and complex bilateral renal cysts complex cyst in the interpolar right kidney with thin septations, unchanged overall in size measuring approximately 18 mm. No hydroureter. No focal mural thickening of the bladder. No nephrolithiasis nor obstructive uropathy. Stomach/Bowel: Decompressed stomach with normal small bowel rotation. Mild fluid-filled small bowel distention secondary to an infraumbilical hernia containing a short segment of jejunum contributing to partial or early SBO. The small bowel leading up to the most caudal hernia measures 2.8 cm in caliber. There are 3 additional hernia sacs in a cluster about the umbilicus, the most left sided containing predominantly omental fat, the most cephalad and right-sided containing small segment of jejunum and omental fat as well as a small hernia interposed between the periumbilical and infraumbilical hernia. Vascular/Lymphatic: Moderate aortoiliac atherosclerosis without aneurysm or dissection. No adenopathy. Reproductive: Hysterectomy. No adnexal mass. No recurrence of mass at the base of the bladder. Other: No acute bowel inflammation. Left-sided colonic diverticulosis is noted.  Musculoskeletal: Thoracolumbar spondylosis without worrisome osseous lesions. IMPRESSION: 1. Redemonstration of 3 periumbilical hernias containing variable degrees of omental fat and small bowel. What appears to be causing early or partial SBO is an infraumbilical ventral hernia separate from these 3 causing dilatation of fluid-filled bowel to 2.8 cm in caliber. No evidence of incarceration. 2. 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 3. Bilateral lower lobe pulmonary consolidations consistent with pneumonia and/or atelectasis. 4. 14 mm acquired cyst in the spleen since prior. 5. Stable hyperplastic appearance of the adrenal glands dating back to 2015. 6. Simple and complex renal cysts. No nephrolithiasis nor obstructive uropathy. 7. Diffuse thoracolumbar degenerative disc disease and facet arthropathy consistent with spondylosis. No aggressive osseous lesions. Electronically Signed   By: Ashley Royalty M.D.   On: 04/27/2017 22:46        Scheduled Meds: . apixaban  5 mg Oral BID  . ferrous sulfate  325 mg Oral BID  WC  . furosemide  40 mg Oral Daily  . ipratropium-albuterol  3 mL Nebulization TID  . lisinopril  20 mg Oral Daily  . loratadine  10 mg Oral Daily  . mouth rinse  15 mL Mouth Rinse BID  . pantoprazole  40 mg Oral Daily  . polyethylene glycol  17 g Oral Daily   Continuous Infusions: . azithromycin Stopped (04/29/17 0640)  . cefTRIAXone (ROCEPHIN)  IV Stopped (04/29/17 0527)     LOS: 2 days    Time spent: 35 minutes.     Elmarie Shiley, MD [facility] Pager [phone]  If 7PM-7AM, please contact night-coverage [email or web address] Password [email or web address] 04/29/2017, 7:11 AM

## 2017-04-29 NOTE — Evaluation (Signed)
Physical Therapy Evaluation Patient Details Name: [name] MRN: [id number] DOB: Apr 19, 1925 Today's Date: 04/29/2017   History of Present Illness  Pt is an 84 y.o. female, with PMH of hypertension, Pafib, CHF, CVA, and COPD.  She presented with c/o yellow sputum and dyspnea for the past few days.  Pt admitted for acute on chronic respiratory failure.     Clinical Impression  Pt admitted with above diagnosis. Pt currently with functional limitations due to the deficits listed below (see PT Problem List). On eval, pt required min assist transfers and min guard assist ambulation 15 feet with RW. Pt on 2 L O2 throughout session. SpO2 92% at rest, desat to 85% during mobility, and increase to 89% after 2-minute recovery in recliner. Pt fatigues quickly. Only able to tolerate minimal activity. Pt will benefit from skilled PT to increase their independence and safety with mobility to allow discharge to the venue listed below.  Pt's desire is to return home. She reluctantly agreed to [place]. Pt will need to consider ST SNF, if no improvement in activity tolerance.     Follow Up Recommendations Home health PT;Supervision - Intermittent    Equipment Recommendations  None recommended by PT    Recommendations for Other Services       Precautions / Restrictions Precautions Precautions: Other (comment);Fall Precaution Comments: watch sats      Mobility  Bed Mobility               General bed mobility comments: Pt received in recliner.  Transfers Overall transfer level: Needs assistance Equipment used: Ambulation equipment used Transfers: Sit to/from [facility] Sit to Stand: Min assist Stand pivot transfers: Min assist       General transfer comment: Pt used momentum to stand with multi attempts. Increased time to stabilize initial standing balance.  Ambulation/Gait Ambulation/Gait assistance: Min guard Ambulation Distance (Feet): 15 Feet Assistive device:  Rolling walker (2 wheeled) Gait Pattern/deviations: Step-through pattern;Decreased stride length;Trunk flexed Gait velocity: decreased Gait velocity interpretation: <1.31 ft/sec, indicative of household ambulator General Gait Details: SpO2 92% at rest on 2 L O2. Desat to 85% during mobility on 2 L O2. Increase to 89% on 2 L after 2-minute recovery in recliner.  Stairs            Wheelchair Mobility    Modified Rankin (Stroke Patients Only)       Balance Overall balance assessment: Needs assistance Sitting-balance support: No upper extremity supported;Feet supported Sitting balance-Leahy Scale: Fair     Standing balance support: Bilateral upper extremity supported;During functional activity Standing balance-Leahy Scale: Poor Standing balance comment: heavy reliance on RW                             Pertinent Vitals/Pain Pain Assessment: No/denies pain    Home Living Family/patient expects to be discharged to:: Private residence Living Arrangements: Children Available Help at Discharge: Family;Available PRN/intermittently Type of Home: House Home Access: Stairs to enter Entrance Stairs-Rails: None Entrance Stairs-Number of Steps: 2 Home Layout: One level Home Equipment: Walker - 2 wheels;Shower seat Additional Comments: on 2 L O2 at home    Prior Function Level of Independence: Needs assistance   Gait / Transfers Assistance Needed: RW for mobility, Pt reports no falls.  ADL's / Homemaking Assistance Needed: modified independent with bathing and dressing. Son assists with meals.        Hand Dominance   Dominant Hand: Right  Extremity/Trunk Assessment   Upper Extremity Assessment Upper Extremity Assessment: Generalized weakness    Lower Extremity Assessment Lower Extremity Assessment: Generalized weakness    Cervical / Trunk Assessment Cervical / Trunk Assessment: Kyphotic  Communication   Communication: No difficulties  Cognition  Arousal/Alertness: Awake/alert Behavior During Therapy: WFL for tasks assessed/performed Overall Cognitive Status: Within Functional Limits for tasks assessed                                        General Comments      Exercises     Assessment/Plan    PT Assessment Patient needs continued PT services  PT Problem List Decreased strength;Decreased mobility;Decreased activity tolerance;Cardiopulmonary status limiting activity;Decreased balance       PT Treatment Interventions Therapeutic activities;Gait training;Therapeutic exercise;Patient/family education;Stair training;Balance training;Functional mobility training    PT Goals (Current goals can be found in the Care Plan section)  Acute Rehab PT Goals Patient Stated Goal: home PT Goal Formulation: With patient/family Time For Goal Achievement: 05/13/17 Potential to Achieve Goals: Fair    Frequency Min 3X/week   Barriers to discharge Decreased caregiver support      Co-evaluation               AM-PAC PT "6 Clicks" Daily Activity  Outcome Measure Difficulty turning over in bed (including adjusting bedclothes, sheets and blankets)?: A Little Difficulty moving from lying on back to sitting on the side of the bed? : Unable Difficulty sitting down on and standing up from a chair with arms (e.g., wheelchair, bedside commode, etc,.)?: Unable Help needed moving to and from a bed to chair (including a wheelchair)?: A Little Help needed walking in hospital room?: A Little Help needed climbing 3-5 steps with a railing? : A Lot 6 Click Score: 13    End of Session Equipment Utilized During Treatment: Gait belt;Oxygen Activity Tolerance: Treatment limited secondary to medical complications (Comment)(desat) Patient left: in chair;with call bell/phone within reach;with family/visitor present Nurse Communication: Mobility status;Other (comment)(desat) PT Visit Diagnosis: Other abnormalities of gait and mobility  (R26.89);Muscle weakness (generalized) (M62.81)    Time: 6222-9798 PT Time Calculation (min) (ACUTE ONLY): 19 min   Charges:   PT Evaluation $PT Eval Moderate Complexity: 1 Mod     PT G Codes:        Lorrin Goodell, PT  Office # [phone] Pager [phone]   Lorriane Shire 04/29/2017, 10:43 AM

## 2017-04-30 LAB — CBC
HCT: 33.5 % — ABNORMAL LOW (ref 36.0–46.0)
Hemoglobin: 11.6 g/dL — ABNORMAL LOW (ref 12.0–15.0)
MCH: 34.1 pg — AB (ref 26.0–34.0)
MCHC: 34.6 g/dL (ref 30.0–36.0)
MCV: 98.5 fL (ref 78.0–100.0)
Platelets: 229 10*3/uL (ref 150–400)
RBC: 3.4 MIL/uL — AB (ref 3.87–5.11)
RDW: 12.4 % (ref 11.5–15.5)
WBC: 10.7 10*3/uL — ABNORMAL HIGH (ref 4.0–10.5)

## 2017-04-30 LAB — TROPONIN I
TROPONIN I: 0.06 ng/mL — AB (ref <0.03)
Troponin I: 0.07 ng/mL (ref <0.03)

## 2017-04-30 LAB — BASIC METABOLIC PANEL
Anion gap: 9 (ref 5–15)
BUN: 26 mg/dL — AB (ref 6–20)
CALCIUM: 8.6 mg/dL — AB (ref 8.9–10.3)
CO2: 27 mmol/L (ref 22–32)
CREATININE: 1.22 mg/dL — AB (ref 0.44–1.00)
Chloride: 99 mmol/L — ABNORMAL LOW (ref 101–111)
GFR calc Af Amer: 44 mL/min — ABNORMAL LOW (ref >60)
GFR, EST NON AFRICAN AMERICAN: 38 mL/min — AB (ref >60)
GLUCOSE: 132 mg/dL — AB (ref 65–99)
POTASSIUM: 3.9 mmol/L (ref 3.5–5.1)
SODIUM: 135 mmol/L (ref 135–145)

## 2017-04-30 LAB — MAGNESIUM: Magnesium: 2.2 mg/dL (ref 1.7–2.4)

## 2017-04-30 MED ORDER — SIMVASTATIN 20 MG PO TABS
20.0000 mg | ORAL_TABLET | Freq: Every day | ORAL | Status: DC
  Administered 2017-04-30: 20 mg via ORAL
  Filled 2017-04-30: qty 1

## 2017-04-30 MED ORDER — GUAIFENESIN ER 600 MG PO TB12
600.0000 mg | ORAL_TABLET | Freq: Two times a day (BID) | ORAL | Status: DC
  Administered 2017-04-30 – 2017-05-01 (×3): 600 mg via ORAL
  Filled 2017-04-30 (×3): qty 1

## 2017-04-30 MED ORDER — NITROGLYCERIN 0.4 MG SL SUBL: 0.4000 mg | SUBLINGUAL_TABLET | SUBLINGUAL | Status: DC | PRN

## 2017-04-30 NOTE — Care Management Note (Addendum)
Case Management Note  Patient Details  Name: [name] MRN: [id number] Date of Birth: [birth year]  Subjective/Objective:    From home with daughter, abd pain, resp failure, copd, chf, paf (on eliquis), pna, on Iv abx.  Per pt eval rec HHPT,  NCM spoke with patient and offered choice from [place] list, she chose [facility] . Referral made to [facility] for [facility] for disease management and HHPT.  Soc will begin 24-48 hrs post dc.  NCM received call back from [name] stating they can not take patient.  NCM will try another agency, contacted [facility], awaiting call back to see if they can take patient.  NCM received call from [facility] with [name] and he states they can take patient.  She will need a follow up apt with her PCP as soon as possible so that any orders [facility] agency will need can be signed.  trops are elevated today, plan for dc tomorrow.  5/1 [place], BSN- notified Drew with [name] that patient is being dc today.                  Action/Plan: DC home with [facility] services with [facility] when ready.   Expected Discharge Date:                  Expected Discharge Plan:  [place]  In-House Referral:     Discharge planning Services  CM Consult  Post Acute Care Choice:  Home Health Choice offered to:     DME Arranged:    DME Agency:     HH Arranged:  PT, RN, Disease Management [place] Agency:  [place]  Status of Service:  Completed, signed off  If discussed at [place] of Stay Meetings, dates discussed:    Additional Comments:  Zenon Mayo, RN 04/30/2017, 2:50 PM

## 2017-04-30 NOTE — Progress Notes (Addendum)
04/30/17 1415:  Dr. Frederic Jericho paged the following:  "2W14:[place],A. Trop 0.06. no chest pain except with cough. EKG completed and on chart/EMR"   1425: New orders received

## 2017-04-30 NOTE — Discharge Instructions (Addendum)

## 2017-04-30 NOTE — Care Management Note (Addendum)
Case Management Note  Patient Details  Name: RELENA IVANCIC MRN: [id number] Date of Birth: 1925-10-21  Subjective/Objective:   From home with daughter, abd pain, resp failure, copd, chf, paf (on eliquis), pna, on Iv abx.  Per pt eval rec HHPT,  NCM spoke with patient and offered choice from [place] list, she chose [facility] . Referral made to [facility] for [facility] for disease management and HHPT.  Soc will begin 24-48 hrs post dc.  NCM received call back from [name] stating they can not take patient.  NCM will try another agency, contacted [facility], awaiting call back to see if they can take patient.                Action/Plan: DC home with [facility] services when ready.  Expected Discharge Date:                  Expected Discharge Plan:  [place]  In-House Referral:     Discharge planning Services  CM Consult  Post Acute Care Choice:  Home Health Choice offered to:     DME Arranged:    DME Agency:     HH Arranged:  PT, RN, Disease Management [place] Agency:  [place]  Status of Service:  Completed, signed off  If discussed at [place] of Stay Meetings, dates discussed:    Additional Comments:  Zenon Mayo, RN 04/30/2017, 10:38 AM

## 2017-04-30 NOTE — Progress Notes (Signed)
Physical Therapy Treatment Patient Details Name: [name] MRN: [id number] DOB: [birth year] Today's Date: 04/30/2017    History of Present Illness Pt is an 84 y.o. female admitted 04/27/17 with c/o yellow sputum and dyspnea; worked up for acute on chronic respiratory failure likely due to RLL pneumonia. Also c/o left lower quadrant abdominal pain; CT showed stable umbilical hernia, but possible early SBO. PMH of HTN, Pafib, CHF, CVA, and COPD.   PT Comments    Pt slowly progressing with mobility. Able to amb 60' with RW and intermittent min guard for balance before requiring seated rest break secondary to fatigue and SOB. SpO2 down to 86% on 2L O2 [place], returning to 94% with seated rest and deep breathing. Pt requires cues for safety with mobility. Increased time discussing fall risk reduction and energy conservation. Will continue to follow acutely.   Follow Up Recommendations  Home health PT;Supervision/Assistance - 24 hour     Equipment Recommendations  None recommended by PT    Recommendations for Other Services       Precautions / Restrictions Precautions Precautions: Fall Restrictions Weight Bearing Restrictions: No    Mobility  Bed Mobility               General bed mobility comments: Pt received sitting in recliner  Transfers Overall transfer level: Needs assistance Equipment used: Rolling walker (2 wheeled) Transfers: Sit to/from Stand Sit to Stand: Min guard         General transfer comment: Stood 2x from recliner with RW and min guard for balance; no physical assist required  Ambulation/Gait Ambulation/Gait assistance: Min guard Ambulation Distance (Feet): 60 Feet Assistive device: Rolling walker (2 wheeled) Gait Pattern/deviations: Step-through pattern;Decreased stride length;Trunk flexed Gait velocity: Decreased Gait velocity interpretation: <1.8 ft/sec, indicate of risk for recurrent falls General Gait Details: Slow amb with RW and intermittent  min guard for balance; required repeated cues to maintain closer proximity to RW, especially with turns. Required seated rest break secondary to fatigue and SOB. SpO2 down to 86% on 2L O2 [facility], returning to 94% with ~1-min seated rest.    Stairs             Wheelchair Mobility    Modified Rankin (Stroke Patients Only)       Balance Overall balance assessment: Needs assistance Sitting-balance support: No upper extremity supported;Feet supported Sitting balance-Leahy Scale: Fair     Standing balance support: During functional activity;Single extremity supported;Bilateral upper extremity supported Standing balance-Leahy Scale: Poor Standing balance comment: Reliance on UE support to maintain balance                            Cognition Arousal/Alertness: Awake/alert Behavior During Therapy: WFL for tasks assessed/performed Overall Cognitive Status: No family/caregiver present to determine baseline cognitive functioning Area of Impairment: Safety/judgement                         Safety/Judgement: Decreased awareness of safety;Decreased awareness of deficits     General Comments: Repeated cues for safety with mobility      Exercises      General Comments        Pertinent Vitals/Pain Pain Assessment: No/denies pain    Home Living                      Prior Function            PT Goals (  current goals can now be found in the care plan section) Acute Rehab PT Goals Patient Stated Goal: Return home PT Goal Formulation: With patient Time For Goal Achievement: 05/13/17 Potential to Achieve Goals: Fair Progress towards PT goals: Progressing toward goals    Frequency    Min 3X/week      PT Plan Current plan remains appropriate;Other (comment)(alerted NT that recliner brakes not working well)    Co-evaluation              AM-PAC PT "6 Clicks" Daily Activity  Outcome Measure  Difficulty turning over in bed (including  adjusting bedclothes, sheets and blankets)?: A Little Difficulty moving from lying on back to sitting on the side of the bed? : A Little Difficulty sitting down on and standing up from a chair with arms (e.g., wheelchair, bedside commode, etc,.)?: A Little Help needed moving to and from a bed to chair (including a wheelchair)?: A Little Help needed walking in hospital room?: A Little Help needed climbing 3-5 steps with a railing? : A Lot 6 Click Score: 17    End of Session Equipment Utilized During Treatment: Gait belt;Oxygen Activity Tolerance: Patient tolerated treatment well;Treatment limited secondary to medical complications (Comment) Patient left: in chair;with call bell/phone within reach Nurse Communication: Mobility status PT Visit Diagnosis: Other abnormalities of gait and mobility (R26.89);Muscle weakness (generalized) (M62.81)     Time: 1914-7829 PT Time Calculation (min) (ACUTE ONLY): 24 min  Charges:  $Gait Training: 8-22 mins $Therapeutic Activity: 8-22 mins                    G Codes:      Mabeline Caras, PT, DPT Acute Rehab Services  Pager: [place] 04/30/2017, 4:56 PM

## 2017-04-30 NOTE — Progress Notes (Signed)
PROGRESS NOTE    [name]  DUK:[id number] DOB: 1925/02/11 DOA: 04/27/2017 PCP: Leonard Downing, MD    Brief Narrative by " Sharene Butters" : 84 year old female with hypertension, paroxysmal atrial fibrillation, congestive heart failure, history of CVA, COPD, who presented with productive yellow sputum, dyspnea for several days, diagnosed with acute respiratory failure with hypoxia, in the setting of  right lower lobe  Pneumonia.On presentation, her white count was 17.4, creatinine stable, troponin negative, lactic negative, EKG without signs of STEMI, but BNP was in the 900s.  No IV fluids were initiated, received IV antibiotic with Zithromax and Rocephin.  In addition, the patient complained of left lower quadrant abdominal pain, with CT showing stable umbilical hernia, but possible early small bowel obstruction, without signs of diverticulitis.  Surgery was consulted.  Patient SBO symptoms has improved. She has been getting treatment for PNA.  Today she had an episode of chest discomfort. . At time of my evaluation she denies chest pain.    Assessment & Plan:   Principal Problem:   Acute on chronic respiratory failure with hypoxia (HCC) Active Problems:   COPD (chronic obstructive pulmonary disease) (HCC)   Hypertension   H/O: hypothyroidism   HLD (hyperlipidemia)   Essential hypertension   Pneumonia   CAP (community acquired pneumonia)   LLQ abdominal pain   GERD (gastroesophageal reflux disease)    Acute Respiratory Failure with Hypoxia likely due to RLL pneumonia   Continue with ceftriaxone and Azithromycin day 3  WBC trending down 16--14--10 Respiratory panel negative.  Blood culture no growth to date.  Dyspnea improving. On 2 L oxygen.   Suspected partial SBO per CT abdomen and pelvis  Appreciate Surgery evaluation.  Tolerating full liquid diet  Had BM yesterday.   Chest tightness;  Also associated with cough.  EKG with PVC, check Mg level.  Cycle cardiac  enzymes. First at 0.06. Start statins. Continue to cycle. Check ECHO.  Has allergy to NSAID. No aspirin  AKI; hold lisinopril. Stable.   Atrial Fibrillation on anticoagulation with Eliquis Not on rate controlled medications.   Diastolic CHF, acute on chronic  Hold , lisinopril due to AKI.  Would continue for now lasix. Repeat Bmet in am.   Hypertension Stable.   Hyperlipidemia Continue home statins  GERD, no acute symptoms Continue PPI    DVT prophylaxis: Eliquis  Code Status: DNR Family Communication: care discussed with patient.  Disposition Plan: to be determine, need PT.    Consultants:   Surgery    Procedures:   none   Antimicrobials:  Ceftriaxone 4-28  Azithromycin 4-28   Subjective: She denies abdominal pain. Had some chest discomfort earlier in the am. Also relate some discomfort after coughing.   Objective: Vitals:   04/29/17 2128 04/30/17 0010 04/30/17 0729 04/30/17 0920  BP:  115/65 125/61   Pulse:  73 80   Resp:   (!) 26   Temp:  98.6 F (37 C) 99.9 F (37.7 C)   TempSrc:  Oral Oral   SpO2: 95% 97% 97% 96%  Weight:      Height:        Intake/Output Summary (Last 24 hours) at 04/30/2017 1203 Last data filed at 04/30/2017 0900 Gross per 24 hour  Intake 840 ml  Output -  Net 840 ml   Filed Weights   04/28/17 1100  Weight: 74.2 kg (163 lb 9.3 oz)    Examination:  General exam: NAD Respiratory system: Bilateral ronchus.  Cardiovascular system: S  1, S 2 RRR Gastrointestinal system: BS , Present. Soft, nt Central nervous system: non focal.  Extremities: symmetric power.  Skin: No rashes, lesions or ulcers Psychiatry: mood and affect appropriate     Data Reviewed: I have personally reviewed following labs and imaging studies  CBC: Recent Labs  Lab 04/27/17 1656 04/28/17 0317 04/29/17 0212 04/30/17 0325  WBC 17.4* 16.4* 14.1* 10.7*  NEUTROABS  --   --  11.8*  --   HGB 12.5 11.7* 11.8* 11.6*  HCT 38.3 32.4* 34.8*  33.5*  MCV 95.0 100.3* 99.4 98.5  PLT 224 215 179 536   Basic Metabolic Panel: Recent Labs  Lab 04/27/17 1656 04/28/17 0317 04/29/17 0212 04/30/17 0325  NA 137 137 136 135  K 4.1 4.2 4.2 3.9  CL 98* 101 99* 99*  CO2 30 26 27 27   GLUCOSE 118* 126* 115* 132*  BUN 16 18 24* 26*  CREATININE 0.86 0.88 1.22* 1.22*  CALCIUM 8.9 8.4* 8.4* 8.6*   GFR: Estimated Creatinine Clearance: 28.1 mL/min (A) (by C-G formula based on SCr of 1.22 mg/dL (H)). Liver Function Tests: Recent Labs  Lab 04/27/17 1656 04/28/17 0317  AST 17 17  ALT 11* 8*  ALKPHOS 61 68  BILITOT 1.9* 1.4*  PROT 6.7 6.2*  ALBUMIN 3.6 3.2*   Recent Labs  Lab 04/27/17 1656  LIPASE 26   No results for input(s): AMMONIA in the last 168 hours. Coagulation Profile: No results for input(s): INR, PROTIME in the last 168 hours. Cardiac Enzymes: No results for input(s): CKTOTAL, CKMB, CKMBINDEX, TROPONINI in the last 168 hours. BNP (last 3 results) No results for input(s): PROBNP in the last 8760 hours. HbA1C: No results for input(s): HGBA1C in the last 72 hours. CBG: No results for input(s): GLUCAP in the last 168 hours. Lipid Profile: No results for input(s): CHOL, HDL, LDLCALC, TRIG, CHOLHDL, LDLDIRECT in the last 72 hours. Thyroid Function Tests: No results for input(s): TSH, T4TOTAL, FREET4, T3FREE, THYROIDAB in the last 72 hours. Anemia Panel: No results for input(s): VITAMINB12, FOLATE, FERRITIN, TIBC, IRON, RETICCTPCT in the last 72 hours. Sepsis Labs: Recent Labs  Lab 04/27/17 1705 04/27/17 2046 04/28/17 0146  LATICACIDVEN 1.27 1.51 1.30    Recent Results (from the past 240 hour(s))  Blood Culture (routine x 2)     Status: None (Preliminary result)   Collection Time: 04/28/17  1:45 AM  Result Value Ref Range Status   Specimen Description BLOOD RIGHT HAND  Final   Special Requests   Final    BOTTLES DRAWN AEROBIC AND ANAEROBIC Blood Culture adequate volume   Culture   Final    NO GROWTH 2  DAYS Performed at [facility], [address], [place], [place] [address]    Report Status PENDING  Incomplete  Respiratory Panel by PCR     Status: None   Collection Time: 04/28/17  2:51 AM  Result Value Ref Range Status   Adenovirus NOT DETECTED NOT DETECTED Final   Coronavirus 229E NOT DETECTED NOT DETECTED Final   Coronavirus HKU1 NOT DETECTED NOT DETECTED Final   Coronavirus NL63 NOT DETECTED NOT DETECTED Final   Coronavirus OC43 NOT DETECTED NOT DETECTED Final   Metapneumovirus NOT DETECTED NOT DETECTED Final   Rhinovirus / Enterovirus NOT DETECTED NOT DETECTED Final   Influenza A NOT DETECTED NOT DETECTED Final   Influenza B NOT DETECTED NOT DETECTED Final   Parainfluenza Virus 1 NOT DETECTED NOT DETECTED Final   Parainfluenza Virus 2 NOT  DETECTED NOT DETECTED Final   Parainfluenza Virus 3 NOT DETECTED NOT DETECTED Final   Parainfluenza Virus 4 NOT DETECTED NOT DETECTED Final   Respiratory Syncytial Virus NOT DETECTED NOT DETECTED Final   Bordetella pertussis NOT DETECTED NOT DETECTED Final   Chlamydophila pneumoniae NOT DETECTED NOT DETECTED Final   Mycoplasma pneumoniae NOT DETECTED NOT DETECTED Final    Comment: Performed at [facility], [place] [address] [place], [place] [address]  Urine culture     Status: Abnormal   Collection Time: 04/28/17  9:23 AM  Result Value Ref Range Status   Specimen Description URINE, CLEAN CATCH  Final   Special Requests   Final    NONE Performed at [facility], [place] [address], [place], [place] [address]    Culture MULTIPLE SPECIES PRESENT, SUGGEST RECOLLECTION (A)  Final   Report Status 04/29/2017 FINAL  Final  Culture, blood (single)     Status: None (Preliminary result)   Collection Time: 04/28/17  9:30 PM  Result Value Ref Range Status   Specimen Description BLOOD RIGHT HAND  Final   Special Requests   Final    BOTTLES DRAWN AEROBIC AND ANAEROBIC Blood Culture adequate volume   Culture   Final    NO GROWTH 2  DAYS Performed at [facility], [place] [address] [place], [place] [address]    Report Status PENDING  Incomplete         Radiology Studies: No results found.      Scheduled Meds: . apixaban  5 mg Oral BID  . docusate sodium  100 mg Oral BID  . ferrous sulfate  325 mg Oral BID WC  . furosemide  40 mg Oral Daily  . guaiFENesin  600 mg Oral BID  . ipratropium-albuterol  3 mL Nebulization TID  . loratadine  10 mg Oral Daily  . mouth rinse  15 mL Mouth Rinse BID  . pantoprazole  40 mg Oral Daily  . polyethylene glycol  17 g Oral Daily   Continuous Infusions: . azithromycin Stopped (04/30/17 0444)  . cefTRIAXone (ROCEPHIN)  IV Stopped (04/30/17 0334)     LOS: 3 days    Time spent: 35 minutes.     Elmarie Shiley, MD [facility] Pager [phone]  If 7PM-7AM, please contact night-coverage [email or web address] Password [facility] 04/30/2017, 12:03 PM

## 2017-05-01 ENCOUNTER — Inpatient Hospital Stay (HOSPITAL_COMMUNITY): Payer: Medicare Other

## 2017-05-01 DIAGNOSIS — I340 Nonrheumatic mitral (valve) insufficiency: Secondary | ICD-10-CM

## 2017-05-01 LAB — CBC
HEMATOCRIT: 40.9 % (ref 36.0–46.0)
HEMOGLOBIN: 13.7 g/dL (ref 12.0–15.0)
MCH: 31.3 pg (ref 26.0–34.0)
MCHC: 33.5 g/dL (ref 30.0–36.0)
MCV: 93.4 fL (ref 78.0–100.0)
Platelets: 128 10*3/uL — ABNORMAL LOW (ref 150–400)
RBC: 4.38 MIL/uL (ref 3.87–5.11)
RDW: 12.8 % (ref 11.5–15.5)
WBC: 6.2 10*3/uL (ref 4.0–10.5)

## 2017-05-01 LAB — LIPID PANEL
CHOLESTEROL: 118 mg/dL (ref 0–200)
HDL: 33 mg/dL — AB (ref >40)
LDL Cholesterol: 74 mg/dL (ref 0–99)
TRIGLYCERIDES: 56 mg/dL (ref <150)
Total CHOL/HDL Ratio: 3.6 RATIO
VLDL: 11 mg/dL (ref 0–40)

## 2017-05-01 LAB — BASIC METABOLIC PANEL
ANION GAP: 7 (ref 5–15)
BUN: 18 mg/dL (ref 6–20)
CO2: 29 mmol/L (ref 22–32)
Calcium: 8.5 mg/dL — ABNORMAL LOW (ref 8.9–10.3)
Chloride: 104 mmol/L (ref 101–111)
Creatinine, Ser: 0.57 mg/dL (ref 0.44–1.00)
GFR calc non Af Amer: >60 mL/min (ref >60)
Glucose, Bld: 128 mg/dL — ABNORMAL HIGH (ref 65–99)
Potassium: 4.4 mmol/L (ref 3.5–5.1)
SODIUM: 140 mmol/L (ref 135–145)

## 2017-05-01 LAB — ECHOCARDIOGRAM COMPLETE
HEIGHTINCHES: 60 in
Weight: 2617.3 oz

## 2017-05-01 LAB — TROPONIN I: TROPONIN I: 0.03 ng/mL — AB (ref <0.03)

## 2017-05-01 MED ORDER — AMOXICILLIN 500 MG PO CAPS: 500.0000 mg | ORAL_CAPSULE | Freq: Three times a day (TID) | ORAL | 0 refills | Status: AC

## 2017-05-01 MED ORDER — AZITHROMYCIN 200 MG/5ML PO SUSR
250.0000 mg | Freq: Every day | ORAL | Status: DC
  Filled 2017-05-01: qty 10

## 2017-05-01 MED ORDER — GUAIFENESIN ER 600 MG PO TB12: 600.0000 mg | ORAL_TABLET | Freq: Two times a day (BID) | ORAL | 0 refills | Status: AC | PRN

## 2017-05-01 MED ORDER — DOCUSATE SODIUM 100 MG PO CAPS: 100.0000 mg | ORAL_CAPSULE | Freq: Two times a day (BID) | ORAL | 0 refills | Status: DC

## 2017-05-01 MED ORDER — AZITHROMYCIN 250 MG PO TABS: 250.0000 mg | ORAL_TABLET | Freq: Once | ORAL | 0 refills | Status: AC

## 2017-05-01 MED ORDER — AZITHROMYCIN 250 MG PO TABS
250.0000 mg | ORAL_TABLET | Freq: Every day | ORAL | Status: DC
  Administered 2017-05-01: 250 mg via ORAL
  Filled 2017-05-01: qty 1

## 2017-05-01 MED ORDER — AMOXICILLIN 500 MG PO CAPS
500.0000 mg | ORAL_CAPSULE | Freq: Three times a day (TID) | ORAL | Status: DC
  Administered 2017-05-01: 500 mg via ORAL
  Filled 2017-05-01: qty 1

## 2017-05-01 MED ORDER — AMOXICILLIN 250 MG/5ML PO SUSR
500.0000 mg | Freq: Three times a day (TID) | ORAL | Status: DC
  Filled 2017-05-01: qty 10

## 2017-05-01 NOTE — Care Management Important Message (Signed)
Important Message  Patient Details  Name: [name] MRN: [id number] Date of Birth: Oct 16, 1925   Medicare Important Message Given:  Yes    Orbie Pyo 05/01/2017, 12:11 PM

## 2017-05-01 NOTE — Discharge Summary (Signed)
Discharge Summary  [name]:[id number] DOB: 1925/12/13  PCP: Leonard Downing, MD  Admit date: 04/27/2017 Discharge date: 05/01/2017   Time spent: < 25 minutes  Admitted From: Home Disposition:  Home  Recommendations for Outpatient Follow-up:  1. Follow up with PCP in 1-2 weeks. 2. Continue Azithromycin and Amoxicillin until 05/02/17  Home Health: YES Equipment/Devices: 2 L O2 Discharge Diagnoses:  Active Hospital Problems   Diagnosis Date Noted  . Acute on chronic respiratory failure with hypoxia ([place]) 12/28/2015  . CAP (community acquired pneumonia) 04/28/2017  . GERD (gastroesophageal reflux disease) 04/28/2017  . LLQ abdominal pain   . Pneumonia 04/27/2017  . Essential hypertension 06/15/2015  . HLD (hyperlipidemia)   . Sepsis due to pneumonia ([place]) 02/13/2014  . H/O: hypothyroidism 01/03/2012  . Hypertension 01/02/2012  . COPD (chronic obstructive pulmonary disease) ([place]) 01/02/2012    Resolved Hospital Problems  No resolved problems to display.    Discharge Condition: Stable  CODE STATUS:DNR Diet recommendation: Heart Healthy    Vitals:   05/01/17 0918 05/01/17 0928  BP: (!) 144/60   Pulse: 81 82  Resp: 18 18  Temp: 98.2 F (36.8 C)   SpO2: 98% 97%    History of present illness:  [name] is a 84 y.o. year old female with medical history significant for hypertension, paroxysmal atrial fibrillation, CHF, history of CVA, COPD on 2 L nasal cannula who presented on 04/27/2017 with 4 days of dyspnea associated with cough productive of yellow sputum and was found to have acute on chronic hypoxic respiratory failure secondary to right lower lobe pneumonia (community-acquired pneumonia). Remaining hospital course addressed in problem based format below:   Hospital Course:  Principal Problem:   Acute on chronic respiratory failure with hypoxia ([place]) Active Problems:   COPD (chronic obstructive pulmonary disease) (H[facility])   Hypertension   H/O:  hypothyroidism   Sepsis due to pneumonia ([place])   HLD (hyperlipidemia)   Essential hypertension   Pneumonia   CAP (community acquired pneumonia)   LLQ abdominal pain   GERD (gastroesophageal reflux disease)   #Acute on chronic hypoxic respiratoryy failure secondary to right lower lobe pneumonia.  Treated with IV ceftriaxone and azithromycin for 4 days followed by transition to oral antibiotics (amoxicillin and azithromycin (to continue for 1 further day to complete 5 total days of antibiotic therapy.   RVP negative, S. pneumo urinary antigen, blood cultures remain negative.  On discharge patient stable on slightly above her home oxygen requirements at 2.5 L nasal cannula m(home-2L).  Encouraged to continue incentive spirometry on discharge  #Sepsis secondary to right lower lobe pneumonia, resolved. On admission T max 100.8, leukocytosis, tachypneic, and hypoxic.  CXR showed R lower lung opacities concerning for infection. Empiric antibiotics as mentioned above led to resolution of sepsis. S. pneumo urinary antigen, blood cultures, and RVP negative. Not productive during admission for sputum culture  #Chronic ventral hernia, stable.  Initially some concern for early signs of small bowel obstruction on CT abdominal imaging during admission.  Hernia remains easily reducible without signs of obstruction per surgery evaluation with no need for surgical intervention.  Follow-up KUB negative for obstruction, patient with flatus and BMs prior to discharge.  Discharged with abdominal binder for supportive care in setting of increased cough with pneumonia.  Patient was able to advance to regular diet prior to discharge.  Bowel regimen was optimized on discharge as well  #Chest discomfort, resolved.  ACS ruled out.  Peak troponin 0 0.07 with  appropriate downtrend. EKG with no acute ischemic changes, shows chronic T wave abnormalities and PVCs. TTE EF moderated reduced (previously 45-50%) now 35-40%. Diffuse  hypokinesis with severe hypokinesis of inferolateral myocardium with grade 1 diastolic dysfunction. Patient without chest pain on day of discharge.  Given admission with sepsis related to pneumonia still possible elevation in troponin related to demand ischemia in setting of infection.  Would be hesitant to pursue cardiac workup in light of active infection and given age and risk vs benefit. I discussed findings with her cardiologist Dr. Acie Fredrickson who agreed conservative medical management was most appropriate.  She will have appropriate close outpatient cardiology follow up.  #Chronic combined systolic/diastolic CHF. Euvolemic on discharge day. To continue home lasix.   #AKI, suspect pre-renal etiology related to sepsis- Peak creatinine 1.22, improved to 0.57. Lisinopril held during admission but resumed on discharge day.   #COPD. Presumed diagnosis given chronic oxygen requirement ( 1 year) and prior smoking history ( 15 years total about 40 years ago). Would benefit for pulmonology referral ( previously recommended by her cardiologist on last visit)  Antibiotics: Azithromycin 4/27-5/1 Ceftriaxone 4/27-4/30 Amoxicillin 5/1-5/2   Microbiology: Blood culutres x2 ( 4/28)- NGTD Urine Culturem(4/28)- multiple species present, suggest recollection RVP (4/28)- Neg  Consultations:  none   Procedures/Studies: TTE 05/01/17: EF: 35-40%, Diffuse hypokinesis with severe hypokinesis of inferolateral myocardium with grade 1 diastolic dysfunction Dg Chest 2 View  Result Date: 04/27/2017 CLINICAL DATA:  Patient with shortness of breath. EXAM: CHEST - 2 VIEW COMPARISON:  Chest radiograph 11/27/2016. FINDINGS: Monitoring leads overlie the patient. Stable enlarged cardiac and mediastinal contours. Heterogeneous opacities right mid lower lung. These are superimposed upon chronic changes. No pleural effusion or pneumothorax. Thoracic spine degenerative changes. IMPRESSION: Right lower lung heterogeneous opacities  may represent atelectasis or infection. Electronically Signed   By: Lovey Newcomer M.D.   On: 04/27/2017 20:33   Dg Abd 1 View  Result Date: 04/28/2017 CLINICAL DATA:  Left lower quadrant pain.  Small bowel obstruction. EXAM: ABDOMEN - 1 VIEW COMPARISON:  CT scan April 27, 2017 FINDINGS: Lung bases are normal. No free air, portal venous gas, pneumatosis. No evidence of small bowel obstruction on this study. IMPRESSION: No evidence of small bowel obstruction on this study. There is a paucity of bowel gas however. Electronically Signed   By: Dorise Bullion III M.D   On: 04/28/2017 10:55   Ct Abdomen Pelvis W Contrast  Result Date: 04/27/2017 CLINICAL DATA:  Left lower quadrant pain with nausea and vomiting. History to hernia on the same side. Pain with urination and foul-smelling urine. EXAM: CT ABDOMEN AND PELVIS WITH CONTRAST TECHNIQUE: Multidetector CT imaging of the abdomen and pelvis was performed using the standard protocol following bolus administration of intravenous contrast. CONTRAST:  171mL ISOVUE-300 IOPAMIDOL (ISOVUE-300) INJECTION 61% COMPARISON:  CT AP 03/30/2013 and CT pelvis 12/01/2013 FINDINGS: Lower chest: Bilateral lower lobe pulmonary consolidations consistent with pneumonia. No effusion or pneumothorax. Heart is normal in size mitral annular calcifications. No pericardial effusion. Hepatobiliary: No space-occupying mass of the liver. No biliary dilatation. No evidence of cholelithiasis or cholecystitis. Pancreas: Atrophic pancreas without mass or inflammation. Spleen: 14 mm acquired cyst of the spleen. No enhancing mass or subcapsular fluid. No splenomegaly. Adrenals/Urinary Tract: Hyperplastic thickening of the adrenal glands dating back to 2015. Stable simple and complex bilateral renal cysts complex cyst in the interpolar right kidney with thin septations, unchanged overall in size measuring approximately 18 mm. No hydroureter. No focal mural thickening of the  bladder. No nephrolithiasis  nor obstructive uropathy. Stomach/Bowel: Decompressed stomach with normal small bowel rotation. Mild fluid-filled small bowel distention secondary to an infraumbilical hernia containing a short segment of jejunum contributing to partial or early SBO. The small bowel leading up to the most caudal hernia measures 2.8 cm in caliber. There are 3 additional hernia sacs in a cluster about the umbilicus, the most left sided containing predominantly omental fat, the most cephalad and right-sided containing small segment of jejunum and omental fat as well as a small hernia interposed between the periumbilical and infraumbilical hernia. Vascular/Lymphatic: Moderate aortoiliac atherosclerosis without aneurysm or dissection. No adenopathy. Reproductive: Hysterectomy. No adnexal mass. No recurrence of mass at the base of the bladder. Other: No acute bowel inflammation. Left-sided colonic diverticulosis is noted. Musculoskeletal: Thoracolumbar spondylosis without worrisome osseous lesions. IMPRESSION: 1. Redemonstration of 3 periumbilical hernias containing variable degrees of omental fat and small bowel. What appears to be causing early or partial SBO is an infraumbilical ventral hernia separate from these 3 causing dilatation of fluid-filled bowel to 2.8 cm in caliber. No evidence of incarceration. 2. 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 3. Bilateral lower lobe pulmonary consolidations consistent with pneumonia and/or atelectasis. 4. 14 mm acquired cyst in the spleen since prior. 5. Stable hyperplastic appearance of the adrenal glands dating back to 2015. 6. Simple and complex renal cysts. No nephrolithiasis nor obstructive uropathy. 7. Diffuse thoracolumbar degenerative disc disease and facet arthropathy consistent with spondylosis. No aggressive osseous lesions. Electronically Signed   By: Ashley Royalty M.D.   On: 04/27/2017 22:46     Discharge Exam: BP (!) 144/60 (BP Location: Left Arm)    Pulse 82   Temp 98.2 F (36.8 C) (Oral)   Resp 18   Ht 5' (1.524 m)   Wt 74.2 kg (163 lb 9.3 oz)   SpO2 97%   BMI 31.95 kg/m   General: Lying in bed, no apparent distress Eyes: EOMI, anicteric ENT: Oral Mucosa clear and moist Cardiovascular: regular rate and rhythm, no murmurs, rubs or gallops, Peripheral Pulses Present, no edema, no JVD Respiratory: Normal respiratory effort on 2 L nasal cannula, lungs clear to auscultation bilaterally Abdomen: soft, non-distended, non-tender, normal bowel sounds, reducible ventral hernia Skin: No Rash Musculoskeletal:Good ROM, no contractures. Normal muscle tone Neurologic: Grossly no focal neuro deficit.Mental status AAOx3, speech normal, Psychiatric:Appropriate affect, and mood   Discharge Instructions You were cared for by a hospitalist during your hospital stay. If you have any questions about your discharge medications or the care you received while you were in the hospital after you are discharged, you can call the unit and asked to speak with the hospitalist on call if the hospitalist that took care of you is not available. Once you are discharged, your primary care physician will handle any further medical issues. Please note that NO REFILLS for any discharge medications will be authorized once you are discharged, as it is imperative that you return to your primary care physician (or establish a relationship with a primary care physician if you do not have one) for your aftercare needs so that they can reassess your need for medications and monitor your lab values.  Discharge Instructions    Diet - low sodium heart healthy   Complete by:  As directed    Face-to-face encounter (required for Medicare/Medicaid patients)   Complete by:  As directed    I Desiree Hane certify that this patient is under  my care and that I, or a nurse practitioner or physician's assistant working with me, had a face-to-face encounter that meets the physician  face-to-face encounter requirements with this patient on 05/01/2017. The encounter with the patient was in whole, or in part for the following medical condition(s) which is the primary reason for home health care (List medical condition): CHF, history of CVA, COPD, Atrial fibrillation   The encounter with the patient was in whole, or in part, for the following medical condition, which is the primary reason for home health care:  CHF, history of CVA, COPD, Atrial fibrillation   I certify that, based on my findings, the following services are medically necessary home health services:   Physical therapy Nursing     Reason for Medically Necessary Home Health Services:  Therapy- Therapeutic Exercises to Increase Strength and Endurance   My clinical findings support the need for the above services:  Shortness of breath with activity   Further, I certify that my clinical findings support that this patient is homebound due to:  Ambulates short distances less than 300 feet   Home Health   Complete by:  As directed    To provide the following care/treatments:   PT OT RN     Increase activity slowly   Complete by:  As directed      Allergies as of 05/01/2017      Reactions   Indomethacin Anaphylaxis, Other (See Comments)   "took it fine for awhile; one day I stopped breathing and I ended up in the hospital" (01/02/2012) Pt has tolerated aspirin      Medication List    TAKE these medications   acetaminophen 500 MG tablet Commonly known as:  TYLENOL Take 1,000 mg by mouth every 6 (six) hours as needed for mild pain. Reported on 07/14/2015   albuterol (2.5 MG/3ML) 0.083% nebulizer solution Commonly known as:  PROVENTIL Take 3 mLs (2.5 mg total) by nebulization every 6 (six) hours as needed for wheezing or shortness of breath.   albuterol 108 (90 Base) MCG/ACT inhaler Commonly known as:  PROVENTIL HFA;VENTOLIN HFA Inhale 2 puffs into the lungs every 6 (six) hours as needed for wheezing.     amoxicillin 500 MG capsule Commonly known as:  AMOXIL Take 1 capsule (500 mg total) by mouth 3 (three) times daily for 1 day. Start taking on:  05/02/2017   apixaban 5 MG Tabs tablet Commonly known as:  ELIQUIS Take 1 tablet (5 mg total) by mouth 2 (two) times daily.   azithromycin 250 MG tablet Commonly known as:  ZITHROMAX Take 1 tablet (250 mg total) by mouth once for 1 dose. Start taking on:  05/02/2017   cetirizine 10 MG tablet Commonly known as:  ZYRTEC Take 10 mg by mouth daily.   cholecalciferol 1000 units tablet Commonly known as:  VITAMIN D Take 1,000 Units by mouth daily.   diphenhydramine-acetaminophen 25-500 MG Tabs tablet Commonly known as:  TYLENOL PM Take 1 tablet by mouth at bedtime as needed (sleep).   docusate sodium 100 MG capsule Commonly known as:  COLACE Take 1 capsule (100 mg total) by mouth 2 (two) times daily.   ferrous sulfate 325 (65 FE) MG tablet Take 325 mg by mouth 2 (two) times daily with a meal.   furosemide 40 MG tablet Commonly known as:  LASIX Take 1 tablet (40 mg total) by mouth daily.   guaiFENesin 600 MG 12 hr tablet Commonly known as:  MUCINEX Take 1 tablet (600  mg total) by mouth 2 (two) times daily as needed for up to 5 days.   lisinopril 20 MG tablet Commonly known as:  PRINIVIL,ZESTRIL Take 20 mg by mouth daily.   omeprazole 20 MG capsule Commonly known as:  PRILOSEC Take 20 mg by mouth 2 (two) times daily.   polyethylene glycol packet Commonly known as:  MIRALAX / GLYCOLAX Take 17 g by mouth daily.   polyvinyl alcohol 1.4 % ophthalmic solution Commonly known as:  LIQUIFILM TEARS Place 1 drop into both eyes as needed for dry eyes.      Allergies  Allergen Reactions  . Indomethacin Anaphylaxis and Other (See Comments)    "took it fine for awhile; one day I stopped breathing and I ended up in the hospital" (01/02/2012) Pt has tolerated aspirin   Follow-up Information    Leonard Downing, MD Follow up.    Specialty:  Family Medicine Why:  Thursday May 02, 2017 at [facility] information: [address] [id number] [place], [place] Follow up.   Specialty:  [place] Why:  HHRN, HHPT,  Contact information: [place]  [address] [phone]            The results of significant diagnostics from this hospitalization (including imaging, microbiology, ancillary and laboratory) are listed below for reference.    Significant Diagnostic Studies: Dg Chest 2 View  Result Date: 04/27/2017 CLINICAL DATA:  Patient with shortness of breath. EXAM: CHEST - 2 VIEW COMPARISON:  Chest radiograph 11/27/2016. FINDINGS: Monitoring leads overlie the patient. Stable enlarged cardiac and mediastinal contours. Heterogeneous opacities right mid lower lung. These are superimposed upon chronic changes. No pleural effusion or pneumothorax. Thoracic spine degenerative changes. IMPRESSION: Right lower lung heterogeneous opacities may represent atelectasis or infection. Electronically Signed   By: Lovey Newcomer M.D.   On: 04/27/2017 20:33   Dg Abd 1 View  Result Date: 04/28/2017 CLINICAL DATA:  Left lower quadrant pain.  Small bowel obstruction. EXAM: ABDOMEN - 1 VIEW COMPARISON:  CT scan April 27, 2017 FINDINGS: Lung bases are normal. No free air, portal venous gas, pneumatosis. No evidence of small bowel obstruction on this study. IMPRESSION: No evidence of small bowel obstruction on this study. There is a paucity of bowel gas however. Electronically Signed   By: Dorise Bullion III M.D   On: 04/28/2017 10:55   Ct Abdomen Pelvis W Contrast  Result Date: 04/27/2017 CLINICAL DATA:  Left lower quadrant pain with nausea and vomiting. History to hernia on the same side. Pain with urination and foul-smelling urine. EXAM: CT ABDOMEN AND PELVIS WITH CONTRAST TECHNIQUE: Multidetector CT imaging of the abdomen and pelvis was  performed using the standard protocol following bolus administration of intravenous contrast. CONTRAST:  119mL ISOVUE-300 IOPAMIDOL (ISOVUE-300) INJECTION 61% COMPARISON:  CT AP 03/30/2013 and CT pelvis 12/01/2013 FINDINGS: Lower chest: Bilateral lower lobe pulmonary consolidations consistent with pneumonia. No effusion or pneumothorax. Heart is normal in size mitral annular calcifications. No pericardial effusion. Hepatobiliary: No space-occupying mass of the liver. No biliary dilatation. No evidence of cholelithiasis or cholecystitis. Pancreas: Atrophic pancreas without mass or inflammation. Spleen: 14 mm acquired cyst of the spleen. No enhancing mass or subcapsular fluid. No splenomegaly. Adrenals/Urinary Tract: Hyperplastic thickening of the adrenal glands dating back to 2015. Stable simple and complex bilateral renal cysts complex cyst in the interpolar right kidney with thin septations, unchanged overall in size measuring approximately  18 mm. No hydroureter. No focal mural thickening of the bladder. No nephrolithiasis nor obstructive uropathy. Stomach/Bowel: Decompressed stomach with normal small bowel rotation. Mild fluid-filled small bowel distention secondary to an infraumbilical hernia containing a short segment of jejunum contributing to partial or early SBO. The small bowel leading up to the most caudal hernia measures 2.8 cm in caliber. There are 3 additional hernia sacs in a cluster about the umbilicus, the most left sided containing predominantly omental fat, the most cephalad and right-sided containing small segment of jejunum and omental fat as well as a small hernia interposed between the periumbilical and infraumbilical hernia. Vascular/Lymphatic: Moderate aortoiliac atherosclerosis without aneurysm or dissection. No adenopathy. Reproductive: Hysterectomy. No adnexal mass. No recurrence of mass at the base of the bladder. Other: No acute bowel inflammation. Left-sided colonic diverticulosis is  noted. Musculoskeletal: Thoracolumbar spondylosis without worrisome osseous lesions. IMPRESSION: 1. Redemonstration of 3 periumbilical hernias containing variable degrees of omental fat and small bowel. What appears to be causing early or partial SBO is an infraumbilical ventral hernia separate from these 3 causing dilatation of fluid-filled bowel to 2.8 cm in caliber. No evidence of incarceration. 2. 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 3. Bilateral lower lobe pulmonary consolidations consistent with pneumonia and/or atelectasis. 4. 14 mm acquired cyst in the spleen since prior. 5. Stable hyperplastic appearance of the adrenal glands dating back to 2015. 6. Simple and complex renal cysts. No nephrolithiasis nor obstructive uropathy. 7. Diffuse thoracolumbar degenerative disc disease and facet arthropathy consistent with spondylosis. No aggressive osseous lesions. Electronically Signed   By: Ashley Royalty M.D.   On: 04/27/2017 22:46    Microbiology: Recent Results (from the past 240 hour(s))  Blood Culture (routine x 2)     Status: None (Preliminary result)   Collection Time: 04/28/17  1:45 AM  Result Value Ref Range Status   Specimen Description BLOOD RIGHT HAND  Final   Special Requests   Final    BOTTLES DRAWN AEROBIC AND ANAEROBIC Blood Culture adequate volume   Culture   Final    NO GROWTH 3 DAYS Performed at [facility], [address], [place], [place] [address]    Report Status PENDING  Incomplete  Respiratory Panel by PCR     Status: None   Collection Time: 04/28/17  2:51 AM  Result Value Ref Range Status   Adenovirus NOT DETECTED NOT DETECTED Final   Coronavirus 229E NOT DETECTED NOT DETECTED Final   Coronavirus HKU1 NOT DETECTED NOT DETECTED Final   Coronavirus NL63 NOT DETECTED NOT DETECTED Final   Coronavirus OC43 NOT DETECTED NOT DETECTED Final   Metapneumovirus NOT DETECTED NOT DETECTED Final   Rhinovirus / Enterovirus NOT DETECTED NOT  DETECTED Final   Influenza A NOT DETECTED NOT DETECTED Final   Influenza B NOT DETECTED NOT DETECTED Final   Parainfluenza Virus 1 NOT DETECTED NOT DETECTED Final   Parainfluenza Virus 2 NOT DETECTED NOT DETECTED Final   Parainfluenza Virus 3 NOT DETECTED NOT DETECTED Final   Parainfluenza Virus 4 NOT DETECTED NOT DETECTED Final   Respiratory Syncytial Virus NOT DETECTED NOT DETECTED Final   Bordetella pertussis NOT DETECTED NOT DETECTED Final   Chlamydophila pneumoniae NOT DETECTED NOT DETECTED Final   Mycoplasma pneumoniae NOT DETECTED NOT DETECTED Final    Comment: Performed at [facility], [place] [address] [place], [place] [address]  Urine culture     Status: Abnormal   Collection Time:  04/28/17  9:23 AM  Result Value Ref Range Status   Specimen Description URINE, CLEAN CATCH  Final   Special Requests   Final    NONE Performed at [facility], [address], [place], [place] [address]    Culture MULTIPLE SPECIES PRESENT, SUGGEST RECOLLECTION (A)  Final   Report Status 04/29/2017 FINAL  Final  Culture, blood (single)     Status: None (Preliminary result)   Collection Time: 04/28/17  9:30 PM  Result Value Ref Range Status   Specimen Description BLOOD RIGHT HAND  Final   Special Requests   Final    BOTTLES DRAWN AEROBIC AND ANAEROBIC Blood Culture adequate volume   Culture   Final    NO GROWTH 3 DAYS Performed at [facility], [place] [address], [place],  [address]    Report Status PENDING  Incomplete     Labs: Basic Metabolic Panel: Recent Labs  Lab 04/27/17 1656 04/28/17 0317 04/29/17 0212 04/30/17 0325 05/01/17 0728  NA 137 137 136 135 140  K 4.1 4.2 4.2 3.9 4.4  CL 98* 101 99* 99* 104  CO2 30 26 27 27 29   GLUCOSE 118* 126* 115* 132* 128*  BUN 16 18 24* 26* 18  CREATININE 0.86 0.88 1.22* 1.22* 0.57  CALCIUM 8.9 8.4* 8.4* 8.6* 8.5*  MG  --   --   --  2.2  --    Liver Function Tests: Recent Labs  Lab 04/27/17 1656 04/28/17 0317  AST  17 17  ALT 11* 8*  ALKPHOS 61 68  BILITOT 1.9* 1.4*  PROT 6.7 6.2*  ALBUMIN 3.6 3.2*   Recent Labs  Lab 04/27/17 1656  LIPASE 26   No results for input(s): AMMONIA in the last 168 hours. CBC: Recent Labs  Lab 04/27/17 1656 04/28/17 0317 04/29/17 0212 04/30/17 0325 05/01/17 0256  WBC 17.4* 16.4* 14.1* 10.7* 6.2  NEUTROABS  --   --  11.8*  --   --   HGB 12.5 11.7* 11.8* 11.6* 13.7  HCT 38.3 32.4* 34.8* 33.5* 40.9  MCV 95.0 100.3* 99.4 98.5 93.4  PLT 224 215 179 229 128*   Cardiac Enzymes: Recent Labs  Lab 04/30/17 1256 04/30/17 1822 04/30/17 2357  TROPONINI 0.06* 0.07* 0.03*   BNP: BNP (last 3 results) Recent Labs    04/27/17 2038  BNP 953.5*    ProBNP (last 3 results) No results for input(s): PROBNP in the last 8760 hours.  CBG: No results for input(s): GLUCAP in the last 168 hours.     Signed:  Desiree Hane, MD [facility] 05/01/2017, 5:20 PM

## 2017-05-01 NOTE — Progress Notes (Signed)
Pt is confused and refusing troponin lab and vital signs, MD notified.

## 2017-05-01 NOTE — Progress Notes (Signed)
Patient with son, Doren Custard, at bedside given all d/c paper work. All pages reviewed and questions answered. Periferal IV and Telemetry d/ced without complications. Patient wheeled to sons car on 2L nasal canula and transferred to patients homes tank on 2L.  English as a second language teacher

## 2017-05-01 NOTE — Progress Notes (Signed)
  Echocardiogram 2D Echocardiogram has been performed.  [name] 05/01/2017, 11:40 AM

## 2017-05-03 LAB — CULTURE, BLOOD (SINGLE)
Culture: NO GROWTH
Special Requests: ADEQUATE

## 2017-05-03 LAB — CULTURE, BLOOD (ROUTINE X 2)
Culture: NO GROWTH
Special Requests: ADEQUATE

## 2017-05-08 ENCOUNTER — Ambulatory Visit (INDEPENDENT_AMBULATORY_CARE_PROVIDER_SITE_OTHER): Payer: Medicare Other | Admitting: Pulmonary Disease

## 2017-05-08 ENCOUNTER — Encounter: Payer: Self-pay | Admitting: Pulmonary Disease

## 2017-05-08 ENCOUNTER — Ambulatory Visit (INDEPENDENT_AMBULATORY_CARE_PROVIDER_SITE_OTHER)
Admission: RE | Admit: 2017-05-08 | Discharge: 2017-05-08 | Disposition: A | Discharge status: Home / Self Care | Payer: Medicare Other | Source: Ambulatory Visit | Attending: Pulmonary Disease | Admitting: Pulmonary Disease

## 2017-05-08 DIAGNOSIS — J189 Pneumonia, unspecified organism: Secondary | ICD-10-CM | POA: Diagnosis not present

## 2017-05-08 DIAGNOSIS — I10 Essential (primary) hypertension: Secondary | ICD-10-CM | POA: Diagnosis not present

## 2017-05-08 DIAGNOSIS — J410 Simple chronic bronchitis: Secondary | ICD-10-CM | POA: Diagnosis not present

## 2017-05-08 DIAGNOSIS — J9610 Chronic respiratory failure, unspecified whether with hypoxia or hypercapnia: Secondary | ICD-10-CM

## 2017-05-08 MED ORDER — PANTOPRAZOLE SODIUM 40 MG PO TBEC: 40.0000 mg | DELAYED_RELEASE_TABLET | Freq: Every day | ORAL | 1 refills | Status: DC

## 2017-05-08 NOTE — Assessment & Plan Note (Signed)
STOP taking lisinopril - this may be adding to your cough Check BP daily - If BP high , call us back or dr Arelia Sneddon & we can replace with losartan 50 mg daily

## 2017-05-08 NOTE — Assessment & Plan Note (Signed)
Chest x-ray today to assess for resolution. Differential diagnosis includes aspiration

## 2017-05-08 NOTE — Patient Instructions (Signed)
CXR today STOP taking lisinopril - this may be adding to your cough Check BP daily - If BP high , call us back or dr Arelia Sneddon & we can replace with losartan 50 mg daily  Trial of protonix 40 mg daily x 6 weeks for reflux  Stay on Mucinex daily

## 2017-05-08 NOTE — Progress Notes (Signed)
   Subjective:    Patient ID: [name], female    DOB: January 10, 1925, 84 y.o.   MRN: [id number]  HPI  84 year old remote smoker for FU of chronic respiratory failure, attributed to upper airway obstruction due to large goiter  She has chronic diastolic heart failure and paroxysmal atrial fibrillation on Eliquis. 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  She had emergent surgery for perforated colon in 2014 and required transient colostomy which was  Reversed.   She has chronic dysphagia and at one point used thickeners. She was hospitalized 04/2017 for bibasilar pneumonia and is improved now, but continues to have clear mucus production.  She states that this mainly comes from drainage of her sinuses and seems to trickle down into her lower airways.  She reports constant throat clearing Reviewed ENT assessment, felt to be a poor surgical candidate for thyroidectomy  Medication review shows lisinopril  Significant tests/ events reviewed  CT neck 82/2018 shows chronic massive thyroid goiter with multiple calcified nodules.  Mass noted in the right upper lobe which has increased compared to 2016 And noted to be hypermetabolic on PET/CT in 7341  Spirometry 10/2016 showed  FEV1 of 73% and FVC of 94% consistent with mild airway obstruction   Past Medical History:  Diagnosis Date  . Anemia    years ago  . Arthritis    "right shoulder" (01/02/2012)  . Bladder mass   . CAP (community acquired pneumonia) 01/02/2012   Lower lobe   . CHF (congestive heart failure) ([place])   . COPD (chronic obstructive pulmonary disease) ([place])   . Difficulty sleeping   . Diverticulitis of colon with perforation 01/14/2012   That is post sigmoid resection with [facility] pouch and end colostomy   . GERD (gastroesophageal reflux disease)    occasional  . Goiter    radioactive iodine ablation/notes 01/02/2012  . History of  kidney stones    X 1  . Hypertension   . Hypothyroidism    "I have taken Synthroid before" (01/02/2012)   HAS HAD RADIOACTIVE IODINE TX 2 YR S AGO  . Intra-abdominal abscess ([place]) 01/14/2012  . PAF (paroxysmal atrial fibrillation) ([place])   . Pneumatosis of intestines 01/14/2012  . Pneumonia 01/02/2012; ? 2009  . Radiation 08/10/13-09/15/13   55 gray to vaginal/bladder mass  . SOB (shortness of breath)    'all the time" (01/02/2012)  . Stroke ([place])   . UTI (lower urinary tract infection) 01/03/2012     Review of Systems neg for any significant sore throat, dysphagia, itching, sneezing, nasal congestion or excess/ purulent secretions, fever, chills, sweats, unintended wt loss, pleuritic or exertional cp, hempoptysis, orthopnea pnd or change in chronic leg swelling. Also denies presyncope, palpitations, heartburn, abdominal pain, nausea, vomiting, diarrhea or change in bowel or urinary habits, dysuria,hematuria, rash, arthralgias, visual complaints, headache, numbness weakness or ataxia.     Objective:   Physical Exam  Gen. Pleasant, elderly,well-nourished, in no distress ENT - no thrush, no post nasal drip, large goiter Neck: No JVD, no thyromegaly, no carotid bruits Lungs: no use of accessory muscles, no dullness to percussion, clear without rales or rhonchi  Cardiovascular: Rhythm regular, heart sounds  normal, no murmurs or gallops, no peripheral edema Musculoskeletal: No deformities, no cyanosis or clubbing        Assessment & Plan:

## 2017-05-08 NOTE — Assessment & Plan Note (Addendum)
Chronic airway obstruction with a large goiter. Continue albuterol as needed and Symbicort since she has symptomatic benefit from this   Excess mucus could be related to recurrent aspiration or sinus drainage or reflux Trial of protonix 40 mg daily x 6 weeks for reflux  Stay on Mucinex daily

## 2017-05-08 NOTE — Assessment & Plan Note (Signed)
Continue on home oxygen current levels

## 2017-05-20 ENCOUNTER — Ambulatory Visit: Payer: Medicare Other | Admitting: Cardiovascular Disease

## 2017-05-20 ENCOUNTER — Encounter: Payer: Self-pay | Admitting: Cardiovascular Disease

## 2017-05-20 VITALS — BP 146/72 | HR 74 | Ht 63.0 in | Wt 152.0 lb

## 2017-05-20 DIAGNOSIS — I63412 Cerebral infarction due to embolism of left middle cerebral artery: Secondary | ICD-10-CM | POA: Diagnosis not present

## 2017-05-20 DIAGNOSIS — I10 Essential (primary) hypertension: Secondary | ICD-10-CM

## 2017-05-20 NOTE — Progress Notes (Signed)
Cardiology Office Note   Date:  05/20/2017   ID:  [name], DOB Sep 28, 1925, MRN [id number]  PCP:  Leonard Downing, MD  Cardiologist:   Mertie Moores, MD   Chief Complaint  Patient presents with  . Congestive Heart Failure   1. HTN 2. CHF 3. Hypothyroidism 4. Diverticulitis 5. Paroxysmal atrial fib   [name] is an 84 yo with hx diverticulitis with diverticulosis in April. She had a perforated diverticulum and became septic. She had emergent surgery with a colostomy placement. She now scheduled to have reversal of her colostomy and the surgeons are requesting cardiac clearance. When she was hospitalized she had an echocardiogram that revealed mildly depressed left ventricular systolic function with an ejection fraction of 40-45%. She did have some diastolic dysfunction.  Left ventricle: The cavity size was moderately dilated. Wall thickness was normal. Systolic function was mildly to moderately reduced. The estimated ejection fraction was in the range of 40% to 45%. There was an increased relative contribution of atrial contraction to ventricular filling. - Left atrium: The atrium was mildly dilated  He has not had any cardiac problems since I last saw her 7 years ago. She does all of her housework without any significant problems. She's able to calm 1 flight of stairs without difficulty. She thinks that she would become short of breath if I asked her to climb 2 flights of stairs.  April, 2015:  She is doing well. She tolerated her colostomy reversal last year without problems. Able to do all of her normal activities without any chest pain or shortness breath. She still able to go up steps although she comments that she climbs slowly.   [name]  [name] is a 84 y.o. female who presents for follow-up of her mild congestive heart failure. She was diagnosed with squamous cell carcinoma of unknown primary. She was hospitalized in Feb, [name] with pneumonia     Breathing is ok.  A little short of breath  Her BP readings at physical therapy are all in the normal range. She did not take her meds this am and the BP is a bit high  April 20, 2015:  [name] was hospitalized with a stroke. She was scheduled for TEE but we were unable to pass the scope. After much effort it was determined that we would likely need general anesthesia. I made the determination given her age and the fact that a transesophageal echo would probably not change our management much that we would cancel the transesophageal echo. The plan was to place a 30 day event monitor for further evaluation to look for atrial fibrillation.  May 27, 2015:  [name] is seen back today . Her 30 day monitor showed no atrial fib.  She is now on a lower dose of lasix  Is having some shortness of breath  - walked from the waiting room to the exam room .  No CP .    Is supposed to be using home O2 but does not have a portable tank that holds oxygen.   Nov. 27, 2017:  [name] is seen today .   Was in the hospital with COPD. Had a brief run of atrial fib. Was started on Eliquis.  Plavix was stopped.   Has had some ankle swelling  Took extra lasix for a few days  which helped.  BP is a bit elevated today.   Sept. 26, 2018:  Continues to have dyspnea.  Wears home O2.  Especially in the mornings.  Coughs up lots of sputum .  Has to force herself to cough  No CP  No leg swelling  Is on Home O2,  She does not know who is following that  May 20, 2017:  Has had some leg swelling .  Doubled her lasix for for the past 2 weeks .   Has had some foot cramps when she takes too much lasix  Has a large right goiter .   Past Medical History:  Diagnosis Date  . Anemia    years ago  . Arthritis    "right shoulder" (01/02/2012)  . Bladder mass   . CAP (community acquired pneumonia) 01/02/2012   Lower lobe   . CHF (congestive heart failure) ([place])   . COPD (chronic obstructive pulmonary disease)  ([place])   . Difficulty sleeping   . Diverticulitis of colon with perforation 01/14/2012   That is post sigmoid resection with [facility] pouch and end colostomy   . GERD (gastroesophageal reflux disease)    occasional  . Goiter    radioactive iodine ablation/notes 01/02/2012  . History of kidney stones    X 1  . Hypertension   . Hypothyroidism    "I have taken Synthroid before" (01/02/2012)   HAS HAD RADIOACTIVE IODINE TX 2 YR S AGO  . Intra-abdominal abscess ([place]) 01/14/2012  . PAF (paroxysmal atrial fibrillation) ([place])   . Pneumatosis of intestines 01/14/2012  . Pneumonia 01/02/2012; ? 2009  . Radiation 08/10/13-09/15/13   57 gray to vaginal/bladder mass  . SOB (shortness of breath)    'all the time" (01/02/2012)  . Stroke ([place])   . UTI (lower urinary tract infection) 01/03/2012    Past Surgical History:  Procedure Laterality Date  . ABDOMINAL HYSTERECTOMY  1980's  . APPENDECTOMY  1980's   "when they did hysterectomy" (01/02/2012)  . CATARACT EXTRACTION W/ INTRAOCULAR LENS  IMPLANT, BILATERAL  ?1990's   Bil  . COLON SURGERY    . COLOSTOMY N/A 03/12/2012   Procedure: COLOSTOMY;  Surgeon: Ralene Ok, MD;  Location: [place];  Service: General;  Laterality: N/A;  . COLOSTOMY REVISION N/A 03/12/2012   Procedure: COLON RESECTION SIGMOID ;  Surgeon: Ralene Ok, MD;  Location: [place];  Service: General;  Laterality: N/A;  . COLOSTOMY TAKEDOWN N/A 10/21/2012   Procedure: LAPAROSCOPIC COLOSTOMY TAKEDOWN AND HARTMANS ANASTOMOSIS, rigid proctoscopy;  Surgeon: Ralene Ok, MD;  Location: [facility];  Service: General;  Laterality: N/A;  . EYE SURGERY    . LYSIS OF ADHESION N/A 10/21/2012   Procedure: LYSIS OF ADHESION;  Surgeon: Ralene Ok, MD;  Location: [facility];  Service: General;  Laterality: N/A;  . TRANSURETHRAL RESECTION OF BLADDER TUMOR WITH GYRUS (TURBT-GYRUS) N/A 06/30/2013   Procedure: TRANSURETHRAL RESECTION OF BLADDER TUMOR WITH GYRUS (TURBT-GYRUS)/VAGINAL BIOPSY;  Surgeon: Ardis Hughs, MD;  Location: [facility];  Service: Urology;  Laterality: N/A;     Current Outpatient Medications  Medication Sig Dispense Refill  . acetaminophen (TYLENOL) 500 MG tablet Take 1,000 mg by mouth every 6 (six) hours as needed for mild pain. Reported on 07/14/2015    . albuterol (PROVENTIL HFA;VENTOLIN HFA) 108 (90 BASE) MCG/ACT inhaler Inhale 2 puffs into the lungs every 6 (six) hours as needed for wheezing.    Marland Kitchen albuterol (PROVENTIL) (2.5 MG/3ML) 0.083% nebulizer solution Take 3 mLs (2.5 mg total) by nebulization every 6 (six) hours as needed for wheezing or shortness of breath. 75 mL 12  .  apixaban (ELIQUIS) 5 MG TABS tablet Take 1 tablet (5 mg total) by mouth 2 (two) times daily. 120 tablet 0  . cetirizine (ZYRTEC) 10 MG tablet Take 10 mg by mouth daily.    . cholecalciferol (VITAMIN D) 1000 units tablet Take 1,000 Units by mouth daily.    . diphenhydramine-acetaminophen (TYLENOL PM) 25-500 MG TABS tablet Take 1 tablet by mouth at bedtime as needed (sleep).     . docusate sodium (COLACE) 100 MG capsule Take 1 capsule (100 mg total) by mouth 2 (two) times daily. 10 capsule 0  . ferrous sulfate 325 (65 FE) MG tablet Take 325 mg by mouth 2 (two) times daily with a meal.     . furosemide (LASIX) 40 MG tablet Take 1 tablet (40 mg total) by mouth daily. 30 tablet 0  . lisinopril (PRINIVIL,ZESTRIL) 20 MG tablet Take 20 mg by mouth daily.     Marland Kitchen omeprazole (PRILOSEC) 20 MG capsule Take 20 mg by mouth 2 (two) times daily.    . pantoprazole (PROTONIX) 40 MG tablet Take 1 tablet (40 mg total) by mouth daily. 30 tablet 1  . polyethylene glycol (MIRALAX / GLYCOLAX) packet Take 17 g by mouth daily.    . polyvinyl alcohol (LIQUIFILM TEARS) 1.4 % ophthalmic solution Place 1 drop into both eyes as needed for dry eyes.     No current facility-administered medications for this visit.     Allergies:   Indomethacin    Social History:  The patient  reports that she quit smoking about 26 years ago. Her  smoking use included cigarettes. She has a 4.20 pack-year smoking history. She has never used smokeless tobacco. She reports that she does not drink alcohol or use drugs.   Family History:  The patient's family history includes Cancer in her sister; Heart disease in her mother and unknown relative; Stroke in her sister.    ROS: Noted in current history, otherwise review of systems is negative.  Physical Exam: Blood pressure (!) 146/72, pulse 74, height 5\' 3"  (1.6 m), weight 152 lb (68.9 kg), SpO2 96 %.  GEN:  Well nourished, well developed in no acute distress HEENT:   NECK: large right goiter. LYMPHATICS: No lymphadenopathy CARDIAC: RRR , soft systolic murmur  RESPIRATORY:  Clear to auscultation without rales, wheezing or rhonchi  ABDOMEN: Soft, non-tender, non-distended MUSCULOSKELETAL:  No edema; No deformity  SKIN: Warm and dry NEUROLOGIC:  Alert and oriented x 3  EKG:      Recent Labs: 04/27/2017: B Natriuretic Peptide 953.5 04/28/2017: ALT 8 04/30/2017: Magnesium 2.2 05/01/2017: BUN 18; Creatinine, Ser 0.57; Hemoglobin 13.7; Platelets 128; Potassium 4.4; Sodium 140    Lipid Panel    Component Value Date/Time   CHOL 118 05/01/2017 0728   TRIG 56 05/01/2017 0728   HDL 33 (L) 05/01/2017 0728   CHOLHDL 3.6 05/01/2017 0728   VLDL 11 05/01/2017 0728   LDLCALC 74 05/01/2017 0728    Wt Readings from Last 3 Encounters:  05/20/17 152 lb (68.9 kg)  05/08/17 156 lb (70.8 kg)  04/28/17 163 lb 9.3 oz (74.2 kg)     Other studies Reviewed: Additional studies/ records that were reviewed today include: . Review of the above records demonstrates:    ASSESSMENT AND PLAN:  1. HTN -  BP is well controlled     2. Chronic combined systolic and diastolic congestive heart failure - She is been on Lasix 40 twice daily for the past several weeks.  Her leg edema is  resolved.  She seems to be tolerating it but knows that her feet will start to cramp if she takes too much Lasix.  I have  advised her to decrease the Lasix back down to 40 mg once a day.  I have given her the okay to take double dose Lasix for perhaps 2 to 3 days at a time and then go back to her normal dose.  It is clear that she still eating some extra salt.  I have advised her to stay away from salty foods and also avoid processed meats.  3. Hypothyroidism 4. diverticulitis 5. Atrial fib  - continue Eliquis 5 mg BID    6.  Possible COPD:     Current medicines are reviewed at length with the patient today.  The patient does not have concerns regarding medicines.  The following changes have been made:  no change  Labs/ tests ordered today include:  No orders of the defined types were placed in this encounter.   Disposition:   FU with me in 6 months  Mertie Moores, MD  05/20/2017 12:02 PM    [address], [place], [place]  [address] Phone: [phone]; Fax: [phone]

## 2017-05-20 NOTE — Patient Instructions (Signed)
Medication Instructions:  Your physician recommends that you continue on your current medications as directed. Please refer to the Current Medication list given to you today.   Labwork: None Ordered   Testing/Procedures: None Ordered   Follow-Up: Your physician wants you to follow-up in: 6 months with Dr. Nahser.  You will receive a reminder letter in the mail two months in advance. If you don't receive a letter, please call our office to schedule the follow-up appointment.   If you need a refill on your cardiac medications before your next appointment, please call your pharmacy.   Thank you for choosing [facility]! Bran Aldridge, RN [phone]    

## 2017-07-22 ENCOUNTER — Other Ambulatory Visit: Payer: Self-pay

## 2017-07-22 ENCOUNTER — Ambulatory Visit
Admission: RE | Admit: 2017-07-22 | Discharge: 2017-07-22 | Disposition: A | Discharge status: Home / Self Care | Payer: Medicare Other | Source: Ambulatory Visit | Attending: Radiation Oncology | Admitting: Radiation Oncology

## 2017-07-22 ENCOUNTER — Encounter: Payer: Self-pay | Admitting: Radiation Oncology

## 2017-07-22 VITALS — BP 147/61 | HR 67 | Temp 97.9°F | Resp 24 | Ht 63.0 in | Wt 154.2 lb

## 2017-07-22 DIAGNOSIS — R300 Dysuria: Secondary | ICD-10-CM

## 2017-07-22 DIAGNOSIS — IMO0002 Reserved for concepts with insufficient information to code with codable children: Secondary | ICD-10-CM

## 2017-07-22 DIAGNOSIS — C799 Secondary malignant neoplasm of unspecified site: Secondary | ICD-10-CM

## 2017-07-22 DIAGNOSIS — C679 Malignant neoplasm of bladder, unspecified: Secondary | ICD-10-CM | POA: Diagnosis not present

## 2017-07-22 DIAGNOSIS — C7982 Secondary malignant neoplasm of genital organs: Secondary | ICD-10-CM | POA: Diagnosis not present

## 2017-07-22 DIAGNOSIS — C4492 Squamous cell carcinoma of skin, unspecified: Secondary | ICD-10-CM

## 2017-07-22 LAB — URINALYSIS, COMPLETE (UACMP) WITH MICROSCOPIC
Bilirubin Urine: NEGATIVE
Glucose, UA: NEGATIVE mg/dL
Hgb urine dipstick: NEGATIVE
Ketones, ur: NEGATIVE mg/dL
Nitrite: NEGATIVE
PH: 6 (ref 5.0–8.0)
Protein, ur: 30 mg/dL — AB
SPECIFIC GRAVITY, URINE: 1.021 (ref 1.005–1.030)

## 2017-07-22 NOTE — Progress Notes (Addendum)
[name] is here for a  Follow-up appointment today. Patient denies any pain states that she has moderate fatigue. States that she gets shortness of breath with activity. Denies any vaginal or rectal bleeding. States that she has some dysuria. Denies any hematuria. Reports noctuira x2. States that she has urgency when she takes her lasix. States that she has some nausea,but states that she does not vomit. Denies any issues with her bowels. Denies any vaginal discharge. Vitals:   07/22/17 1031  BP: (!) 147/61  Pulse: 67  Resp: (!) 24  Temp: 97.9 F (36.6 C)  TempSrc: Oral  SpO2: 98%  Weight: 154 lb 3.2 oz (69.9 kg)  Height: 5\' 3"  (1.6 m)  PF: (!) 3 L/min

## 2017-07-22 NOTE — Progress Notes (Signed)
Radiation Oncology         [phone] ________________________________  Name: [name] MRN: [id number]  Date: 07/22/2017  DOB: July 12, 1925  Follow-Up Visit Note  CC: Leonard Downing, MD  Everitt Amber, MD    ICD-10-CM   1. Squamous cell carcinoma, metastatic (HCC) C79.9     Diagnosis:   84 y.o. female with squamous cell carcinoma of unknown origin presenting in the bladder and upper vaginal area  Interval Since Last Radiation:  3 years 10 months 08/10/2013 - 09/15/2013: 55 Gy in 25 fractions (2.2 Gy per fraction) treatment was directed at the vaginal/bladder mass  Narrative:  The patient returns today for routine follow-up.   Since her last follow-up in January she's had a ct abdomen pelvis w/ contrast on 04/27/2017. Redemonstration of 3 periumbilical hernias containing variable degrees of omental fat and small bowel. What appears to be causing early or partial SBO is an infraumbilical ventral hernia separate from these 3 causing dilatation of fluid-filled bowel to 2.8 cm in caliber. No evidence of incarceration. 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 Bilateral lower lobe pulmonary consolidations consistent with pneumonia and/or atelectasis.14 mm acquired cyst in the spleen since prior. Stable hyperplastic appearance of the adrenal glands dating back to 2015. Simple and complex renal cysts. No nephrolithiasis nor obstructive uropathy.  Diffuse thoracolumbar degenerative disc disease and facet arthropathy consistent with spondylosis. No aggressive osseous lesions.  Patient came in today with a complaint of dysuria, she was accompanied by her daughter. She said it was easier to urinate when she was on Rx lasix. She also had her colostomy bag removed. She hasn't had any mammograms done recently. She had no hematuria, no vaginal bleeding, no shortness of breath.        ALLERGIES:  is allergic to indomethacin.  Meds: Current Outpatient  Medications  Medication Sig Dispense Refill  . albuterol (PROVENTIL HFA;VENTOLIN HFA) 108 (90 BASE) MCG/ACT inhaler Inhale 2 puffs into the lungs every 6 (six) hours as needed for wheezing.    Marland Kitchen albuterol (PROVENTIL) (2.5 MG/3ML) 0.083% nebulizer solution Take 3 mLs (2.5 mg total) by nebulization every 6 (six) hours as needed for wheezing or shortness of breath. 75 mL 12  . apixaban (ELIQUIS) 5 MG TABS tablet Take 1 tablet (5 mg total) by mouth 2 (two) times daily. 120 tablet 0  . cetirizine (ZYRTEC) 10 MG tablet Take 10 mg by mouth daily.    . cholecalciferol (VITAMIN D) 1000 units tablet Take 1,000 Units by mouth daily.    . ferrous sulfate 325 (65 FE) MG tablet Take 325 mg by mouth 2 (two) times daily with a meal.     . fluticasone (FLONASE) 50 MCG/ACT nasal spray     . furosemide (LASIX) 40 MG tablet Take 1 tablet (40 mg total) by mouth daily. 30 tablet 0  . omeprazole (PRILOSEC) 20 MG capsule Take 20 mg by mouth 2 (two) times daily.    . polyethylene glycol (MIRALAX / GLYCOLAX) packet Take 17 g by mouth daily.    . polyvinyl alcohol (LIQUIFILM TEARS) 1.4 % ophthalmic solution Place 1 drop into both eyes as needed for dry eyes.    Marland Kitchen acetaminophen (TYLENOL) 500 MG tablet Take 1,000 mg by mouth every 6 (six) hours as needed for mild pain. Reported on 07/14/2015    . diphenhydramine-acetaminophen (TYLENOL PM) 25-500 MG TABS tablet Take 1 tablet by mouth at bedtime as needed (sleep).     Marland Kitchen  docusate sodium (COLACE) 100 MG capsule Take 1 capsule (100 mg total) by mouth 2 (two) times daily. (Patient not taking: Reported on 07/22/2017) 10 capsule 0  . lisinopril (PRINIVIL,ZESTRIL) 20 MG tablet Take 20 mg by mouth daily.     . pantoprazole (PROTONIX) 40 MG tablet Take 1 tablet (40 mg total) by mouth daily. (Patient not taking: Reported on 07/22/2017) 30 tablet 1   No current facility-administered medications for this encounter.     Physical Findings: The patient is in no acute distress. Patient is  alert and oriented.  height is 5\' 3"  (1.6 m) and weight is 154 lb 3.2 oz (69.9 kg). Her oral temperature is 97.9 F (36.6 C). Her blood pressure is 147/61 (abnormal) and her pulse is 67. Her respiration is 24 (abnormal) and oxygen saturation is 98%.   Lungs are clear to auscultation bilaterally. Heart has regular rate and rhythm. No palpable cervical, supraclavicular, or axillary adenopathy. Significant swelling of her  thyroid gland,  Abdomen soft, non-tender, with normal bowel sounds. She was noted to have a 2x 2.5 right axillary mass.  On pelvic examination the external genitalia were unremarkable. A speculum exam was performed. There are no mucosal lesions noted in the vaginal vault. On bimanual examination there were no pelvic masses appreciated.  Lab Findings: Lab Results  Component Value Date   WBC 6.2 05/01/2017   HGB 13.7 05/01/2017   HCT 40.9 05/01/2017   MCV 93.4 05/01/2017   PLT 128 (L) 05/01/2017    Radiographic Findings: No results found.  Impression:  No evidence of disease recurrence on clinical exam. She was noted to have a 2x 2.5 right axillary mass but given the patient medical history age,  and significant medical problems, she does not wish to pursue  evaluation of this issue.   Plan:  Return for routine follow-up in 6 months.     ____________________________________  Blair Promise, PhD, MD  This document serves as a record of services personally performed by Gery Pray, MD. It was created on his behalf by Vanessa Ralphs, a trained medical scribe. The creation of this record is based on the scribe's personal observations and the provider's statements to them. This document has been checked and approved by the attending provider.

## 2017-07-23 LAB — URINE CULTURE: Culture: <10000 — AB

## 2017-07-24 ENCOUNTER — Telehealth: Payer: Self-pay

## 2017-07-24 NOTE — Telephone Encounter (Signed)
Gery Pray, MD  Loma Sousa, RN        Sharee Pimple, please inform patient of good results on urine culture. Thanks, jk    Contacted pt to convey good results of urine culture. Pt without questions/concerns. Pt verbalized understanding. Loma Sousa, RN BSN

## 2017-08-09 ENCOUNTER — Ambulatory Visit: Payer: Medicare Other | Admitting: Adult Health

## 2017-08-14 ENCOUNTER — Ambulatory Visit (INDEPENDENT_AMBULATORY_CARE_PROVIDER_SITE_OTHER)
Admission: RE | Admit: 2017-08-14 | Discharge: 2017-08-14 | Disposition: A | Discharge status: Home / Self Care | Payer: Medicare Other | Source: Ambulatory Visit | Attending: Nurse Practitioner | Admitting: Nurse Practitioner

## 2017-08-14 ENCOUNTER — Ambulatory Visit: Payer: Medicare Other | Admitting: Nurse Practitioner

## 2017-08-14 ENCOUNTER — Encounter: Payer: Self-pay | Admitting: Nurse Practitioner

## 2017-08-14 VITALS — BP 134/70 | HR 65 | Ht 61.0 in | Wt 154.0 lb

## 2017-08-14 DIAGNOSIS — J9610 Chronic respiratory failure, unspecified whether with hypoxia or hypercapnia: Secondary | ICD-10-CM

## 2017-08-14 DIAGNOSIS — J410 Simple chronic bronchitis: Secondary | ICD-10-CM

## 2017-08-14 DIAGNOSIS — K219 Gastro-esophageal reflux disease without esophagitis: Secondary | ICD-10-CM

## 2017-08-14 MED ORDER — PANTOPRAZOLE SODIUM 40 MG PO TBEC: 40.0000 mg | DELAYED_RELEASE_TABLET | Freq: Every day | ORAL | 1 refills | Status: AC

## 2017-08-14 NOTE — Assessment & Plan Note (Signed)
Continue O2 at 2 L 

## 2017-08-14 NOTE — Assessment & Plan Note (Signed)
Will reorder protonix Follow up with Dr. Elsworth Soho in 1 month

## 2017-08-14 NOTE — Patient Instructions (Signed)
Patient had ran out of protonix - will reorder  Please start mucinex - will give samples.  Continue O2 at 2 liters Take all other medications as directed Chest x ray today - will call with results

## 2017-08-14 NOTE — Assessment & Plan Note (Signed)
Patient Instructions  Patient had ran out of protonix - will reorder  Please start mucinex - will give samples.  Continue O2 at 2 liters Take all other medications as directed Chest x ray today - will call with results

## 2017-08-14 NOTE — Progress Notes (Signed)
@Patient  ID: [name], female    DOB: 1925-04-22, 84 y.o.   MRN: [id number]  Chief Complaint  Patient presents with  . Follow-up    3 Month Follow Up    Referring provider: Leonard Downing, *  84 year old former smoker with chronic respiratory failure (2L O2 daily), COPD, and recent history of pneumonia followed by Dr. Elsworth Soho. Health history diastolic heart failure, a-fib (eliquis), Metastatic squamous cell carcinoma, chronic dysphagia.  HPI Patient presents today for routine follow up. States that she has not been taking muciniex or protonix as directed by Dr. Elsworth Soho at last visit. She tried them for a while and noticed improvement with congestion and throat clearing. She states that over the past month symptoms have worsened since she stopped the medications. She states that she is having a lot of sinus drainage and is very congested in the mornings. Clears throat frequently. She is compliant with O2. Denies any recent fever. She is compliant with proventil.   Recent [facility] Encounters:   OV 05-08-17: Chronic airway obstruction with a large goiter. Continue albuterol as needed and Symbicort since she has symptomatic benefit from this   Excess mucus could be related to recurrent aspiration or sinus drainage or reflux Trial of protonix 40 mg daily x 6 weeks for reflux  Stay on Mucinex daily   Tests:  CT neck 10/2016 shows chronic massive thyroid goiter with multiple calcified nodules.Mass noted in the right upper lobe which has increased compared to 2016 And noted to be hypermetabolic on PET/CT in 6073  Spirometry 10/2016 showed  FEV1 of 73% and FVC of 94% consistent with mild airway obstruction       Allergies  Allergen Reactions  . Indomethacin Anaphylaxis and Other (See Comments)    "took it fine for awhile; one day I stopped breathing and I ended up in the hospital" (01/02/2012) Pt has tolerated aspirin    Immunization History  Administered  Date(s) Administered  . Influenza,inj,Quad PF,6+ Mos 09/11/2013  . Influenza-Unspecified 10/02/2011  . Pneumococcal Polysaccharide-23 01/17/2012    Past Medical History:  Diagnosis Date  . Anemia    years ago  . Arthritis    "right shoulder" (01/02/2012)  . Bladder mass   . CAP (community acquired pneumonia) 01/02/2012   Lower lobe   . CHF (congestive heart failure) ([place])   . COPD (chronic obstructive pulmonary disease) ([place])   . Difficulty sleeping   . Diverticulitis of colon with perforation 01/14/2012   That is post sigmoid resection with [facility] pouch and end colostomy   . GERD (gastroesophageal reflux disease)    occasional  . Goiter    radioactive iodine ablation/notes 01/02/2012  . History of kidney stones    X 1  . Hypertension   . Hypothyroidism    "I have taken Synthroid before" (01/02/2012)   HAS HAD RADIOACTIVE IODINE TX 2 YR S AGO  . Intra-abdominal abscess ([facility]) 01/14/2012  . PAF (paroxysmal atrial fibrillation) ([place])   . Pneumatosis of intestines 01/14/2012  . Pneumonia 01/02/2012; ? 2009  . Radiation 08/10/13-09/15/13   55 gray to vaginal/bladder mass  . SOB (shortness of breath)    'all the time" (01/02/2012)  . Stroke ([place])   . UTI (lower urinary tract infection) 01/03/2012    Tobacco History: Social History   Tobacco Use  Smoking Status Former Smoker  . Packs/day: 0.12  . Years: 35.00  . Pack years: 4.20  . Types: Cigarettes  . Last attempt to  quit: 01/02/1991  . Years since quitting: 26.6  Smokeless Tobacco Never Used   Counseling given: Former smoker   Outpatient Encounter Medications as of 08/14/2017  Medication Sig  . acetaminophen (TYLENOL) 500 MG tablet Take 1,000 mg by mouth every 6 (six) hours as needed for mild pain. Reported on 07/14/2015  . albuterol (PROVENTIL HFA;VENTOLIN HFA) 108 (90 BASE) MCG/ACT inhaler Inhale 2 puffs into the lungs every 6 (six) hours as needed for wheezing.  Marland Kitchen albuterol (PROVENTIL) (2.5 MG/3ML) 0.083% nebulizer solution Take  3 mLs (2.5 mg total) by nebulization every 6 (six) hours as needed for wheezing or shortness of breath.  Marland Kitchen apixaban (ELIQUIS) 5 MG TABS tablet Take 1 tablet (5 mg total) by mouth 2 (two) times daily.  . cetirizine (ZYRTEC) 10 MG tablet Take 10 mg by mouth daily.  . cholecalciferol (VITAMIN D) 1000 units tablet Take 1,000 Units by mouth daily.  . diphenhydramine-acetaminophen (TYLENOL PM) 25-500 MG TABS tablet Take 1 tablet by mouth at bedtime as needed (sleep).   . docusate sodium (COLACE) 100 MG capsule Take 1 capsule (100 mg total) by mouth 2 (two) times daily.  . ferrous sulfate 325 (65 FE) MG tablet Take 325 mg by mouth 2 (two) times daily with a meal.   . fluticasone (FLONASE) 50 MCG/ACT nasal spray   . furosemide (LASIX) 40 MG tablet Take 1 tablet (40 mg total) by mouth daily.  Marland Kitchen lisinopril (PRINIVIL,ZESTRIL) 20 MG tablet Take 20 mg by mouth daily.   Marland Kitchen omeprazole (PRILOSEC) 20 MG capsule Take 20 mg by mouth 2 (two) times daily.  . pantoprazole (PROTONIX) 40 MG tablet Take 1 tablet (40 mg total) by mouth daily.  . polyethylene glycol (MIRALAX / GLYCOLAX) packet Take 17 g by mouth daily.  . polyvinyl alcohol (LIQUIFILM TEARS) 1.4 % ophthalmic solution Place 1 drop into both eyes as needed for dry eyes.  . [DISCONTINUED] pantoprazole (PROTONIX) 40 MG tablet Take 1 tablet (40 mg total) by mouth daily.   No facility-administered encounter medications on file as of 08/14/2017.      Review of Systems  Review of Systems  Constitutional: Negative.   HENT: Positive for congestion and postnasal drip.   Respiratory: Positive for cough and shortness of breath.   Cardiovascular: Negative.   Gastrointestinal: Negative.   Allergic/Immunologic: Negative.   Neurological: Negative.   Psychiatric/Behavioral: Negative.        Physical Exam  BP 134/70 (BP Location: Left Arm, Patient Position: Sitting, Cuff Size: Normal)   Pulse 65   Ht 5\' 1"  (1.549 m)   Wt 154 lb (69.9 kg)   SpO2 95%   BMI  29.10 kg/m   Wt Readings from Last 5 Encounters:  08/14/17 154 lb (69.9 kg)  07/22/17 154 lb 3.2 oz (69.9 kg)  05/20/17 152 lb (68.9 kg)  05/08/17 156 lb (70.8 kg)  04/28/17 163 lb 9.3 oz (74.2 kg)     Physical Exam  Constitutional: She is oriented to person, place, and time. She appears well-developed and well-nourished. No distress.  Cardiovascular: Normal rate and regular rhythm.  Pulmonary/Chest: Effort normal. She has no wheezes. She has no rales.  Neurological: She is alert and oriented to person, place, and time.  Psychiatric: She has a normal mood and affect.  Nursing note and vitals reviewed.    Lab Results:  CBC    Component Value Date/Time   WBC 6.2 05/01/2017 0256   RBC 4.38 05/01/2017 0256   HGB 13.7 05/01/2017 0256  HGB 11.7 09/02/2014 1245   HCT 40.9 05/01/2017 0256   HCT 34.9 09/02/2014 1245   PLT 128 (L) 05/01/2017 0256   PLT 256 09/02/2014 1245   MCV 93.4 05/01/2017 0256   MCV 94.3 09/02/2014 1245   MCH 31.3 05/01/2017 0256   MCHC 33.5 05/01/2017 0256   RDW 12.8 05/01/2017 0256   RDW 12.3 09/02/2014 1245   LYMPHSABS 1.0 04/29/2017 0212   LYMPHSABS 0.9 09/02/2014 1245   MONOABS 1.2 (H) 04/29/2017 0212   MONOABS 0.5 09/02/2014 1245   EOSABS 0.1 04/29/2017 0212   EOSABS 0.3 09/02/2014 1245   BASOSABS 0.0 04/29/2017 0212   BASOSABS 0.1 09/02/2014 1245    BMET    Component Value Date/Time   NA 140 05/01/2017 0728   NA 144 09/02/2014 1245   K 4.4 05/01/2017 0728   K 4.2 09/02/2014 1245   CL 104 05/01/2017 0728   CO2 29 05/01/2017 0728   CO2 29 09/02/2014 1245   GLUCOSE 128 (H) 05/01/2017 0728   GLUCOSE 95 09/02/2014 1245   BUN 18 05/01/2017 0728   BUN 21.8 09/02/2014 1245   CREATININE 0.57 05/01/2017 0728   CREATININE 0.75 05/09/2015 1157   CREATININE 1.1 09/02/2014 1245   CALCIUM 8.5 (L) 05/01/2017 0728   CALCIUM 9.4 09/02/2014 1245   GFRNONAA >60 05/01/2017 0728   GFRAA >60 05/01/2017 0728    BNP    Component Value Date/Time     BNP 953.5 (H) 04/27/2017 2038    ProBNP    Component Value Date/Time   PROBNP 821.4 (H) 03/24/2012 1608    Imaging: Dg Chest 2 View  Result Date: 08/14/2017 CLINICAL DATA:  84 year old female with cough congestion and shortness of breath for 1-2 weeks. EXAM: CHEST - 2 VIEW COMPARISON:  Chest radiographs 05/08/2017 and earlier. FINDINGS: Chronic large lung volumes and cardiomegaly. Bulky chronic dystrophic calcifications at the thoracic inlet related to partially calcified large thyroid goiter as seen on CTA chest 02/13/2014. Stable tracheal air column. The stable lung markings with no pneumothorax, pleural effusion or acute pulmonary opacity. Stable visualized osseous structures. Negative visible bowel gas pattern. IMPRESSION: 1.  No acute cardiopulmonary abnormality. 2. Chronic cardiomegaly and pulmonary hyperinflation. 3. Chronic thyroid goiter with bulky chronic dystrophic calcifications. Electronically Signed   By: Genevie Ann M.D.   On: 08/14/2017 12:05     Assessment & Plan:   COPD (chronic obstructive pulmonary disease) [facility]) Patient Instructions  Patient had ran out of protonix - will reorder  Please start mucinex - will give samples.  Continue O2 at 2 liters Take all other medications as directed Chest x ray today - will call with results     Chronic respiratory failure, unsp w hypoxia or hypercapnia (HCC) Continue O2 at 2L  GERD (gastroesophageal reflux disease) Will reorder protonix Follow up with Dr. Elsworth Soho in 1 month     Fenton Foy, NP 08/14/2017

## 2017-09-17 ENCOUNTER — Ambulatory Visit: Payer: Medicare Other | Admitting: Pulmonary Disease

## 2017-10-11 ENCOUNTER — Ambulatory Visit: Payer: Medicare Other | Admitting: Pulmonary Disease

## 2017-10-11 ENCOUNTER — Encounter: Payer: Self-pay | Admitting: Pulmonary Disease

## 2017-10-11 DIAGNOSIS — E049 Nontoxic goiter, unspecified: Secondary | ICD-10-CM

## 2017-10-11 DIAGNOSIS — J411 Mucopurulent chronic bronchitis: Secondary | ICD-10-CM | POA: Diagnosis not present

## 2017-10-11 DIAGNOSIS — J9610 Chronic respiratory failure, unspecified whether with hypoxia or hypercapnia: Secondary | ICD-10-CM | POA: Diagnosis not present

## 2017-10-11 NOTE — Progress Notes (Signed)
   Subjective:    Patient ID: [name], female    DOB: 1925-04-08, 84 y.o.   MRN: [id number]  HPI  84 yo remote smoker for FU of chronic respiratory failure, attributed to upper airway obstruction due to large goiter  She has chronic diastolic heart failure and paroxysmal atrial fibrillation on Eliquis. She also has a history of metastatic squamous cell carcinoma  to the pelvis, presumed cervical origin treated with radiation in 2014.She had emergent surgery for perforated colon in 2014 and required transient colostomy which was  Reversed.  She has chronic dysphagia and at one point used thickeners. She was hospitalized 04/2017 for bibasilar pneumonia which has now resolved CXR 08/2017 clear  Accompanied by her son today.  Her main issue is constant mucus production.  Mucinex seems to help but she wonders if she gets side effects from taking this for a long time.  She is unable to swallow large pills and prefers smaller pills.  Her son has been cutting this up for her.  She takes Lasix daily, pedal edema has now resolved. Compliant with oxygen which she seems to need on a daily basis  She is unable to lie down flat and sleeps in a recliner.  She really feels that this is related to postnasal drip.  I reviewed last head CT from 2017 which does not show any sinus collections Lisinopril was stopped and she feels her cough is improved Significant tests/ events reviewed  CT neck 10/2016 shows chronic massive thyroid goiter with multiple calcified nodules.Mass noted in the right upper lobe which has increased compared to 2016 And noted to be hypermetabolic on PET/CT in 8115  Spirometry 10/2016 showed  FEV1 of 73% and FVC of 94% consistent with mild airway obstruction  Review of Systems neg for any significant sore throat, dysphagia, itching, sneezing, nasal congestion or excess/ purulent secretions, fever, chills, sweats, unintended wt loss, pleuritic or exertional cp, hempoptysis,  orthopnea pnd or change in chronic leg swelling. Also denies presyncope, palpitations, heartburn, abdominal pain, nausea, vomiting, diarrhea or change in bowel or urinary habits, dysuria,hematuria, rash, arthralgias, visual complaints, headache, numbness weakness or ataxia.     Objective:   Physical Exam  Gen. Pleasant, well-nourished, elderly, in no distress ENT - no thrush, no post nasal drip , large goiter, no stridor Neck: No JVD, no thyromegaly, no carotid bruits Lungs: no use of accessory muscles, no dullness to percussion, clear without rales or rhonchi  Cardiovascular: Rhythm regular, heart sounds  normal, no murmurs or gallops, no peripheral edema Musculoskeletal: No deformities, no cyanosis or clubbing        Assessment & Plan:

## 2017-10-11 NOTE — Assessment & Plan Note (Signed)
Feel that this is her main issue and upper airway obstruction is what makes it difficult for her to lie down flat Unfortunately she is not a candidate for surgery has been evaluated by ENT

## 2017-10-11 NOTE — Patient Instructions (Signed)
Okay to break Mucinex in half. Prescription for hypertonic saline nebs that she can take once daily to help you expectorate mucus.  Okay to decrease Lasix to every other day-Monday/Wednesday/Friday when leg swelling is better

## 2017-10-11 NOTE — Assessment & Plan Note (Signed)
Ct O2 

## 2017-10-11 NOTE — Assessment & Plan Note (Signed)
Okay to break Mucinex in half. Prescription for hypertonic saline nebs that she can take once daily to help you expectorate mucus.  Okay to decrease Lasix to every other day-Monday/Wednesday/Friday when leg swelling is better

## 2017-11-10 ENCOUNTER — Emergency Department (HOSPITAL_COMMUNITY): Payer: Medicare Other

## 2017-11-10 ENCOUNTER — Inpatient Hospital Stay (HOSPITAL_COMMUNITY)
Admission: EM | Admit: 2017-11-10 | Discharge: 2017-11-15 | DRG: 292 | Disposition: A | Discharge status: Home / Self Care | Payer: Medicare Other | Attending: Internal Medicine | Admitting: Internal Medicine

## 2017-11-10 ENCOUNTER — Encounter (HOSPITAL_COMMUNITY): Payer: Self-pay | Admitting: Emergency Medicine

## 2017-11-10 ENCOUNTER — Other Ambulatory Visit: Payer: Self-pay

## 2017-11-10 DIAGNOSIS — J449 Chronic obstructive pulmonary disease, unspecified: Secondary | ICD-10-CM | POA: Diagnosis present

## 2017-11-10 DIAGNOSIS — Z66 Do not resuscitate: Secondary | ICD-10-CM | POA: Diagnosis present

## 2017-11-10 DIAGNOSIS — Z87440 Personal history of urinary (tract) infections: Secondary | ICD-10-CM

## 2017-11-10 DIAGNOSIS — I472 Ventricular tachycardia: Secondary | ICD-10-CM | POA: Diagnosis present

## 2017-11-10 DIAGNOSIS — I493 Ventricular premature depolarization: Secondary | ICD-10-CM | POA: Diagnosis present

## 2017-11-10 DIAGNOSIS — Z9981 Dependence on supplemental oxygen: Secondary | ICD-10-CM

## 2017-11-10 DIAGNOSIS — I480 Paroxysmal atrial fibrillation: Secondary | ICD-10-CM | POA: Diagnosis not present

## 2017-11-10 DIAGNOSIS — I272 Pulmonary hypertension, unspecified: Secondary | ICD-10-CM | POA: Diagnosis present

## 2017-11-10 DIAGNOSIS — E039 Hypothyroidism, unspecified: Secondary | ICD-10-CM | POA: Diagnosis present

## 2017-11-10 DIAGNOSIS — I5023 Acute on chronic systolic (congestive) heart failure: Secondary | ICD-10-CM | POA: Diagnosis not present

## 2017-11-10 DIAGNOSIS — Z87891 Personal history of nicotine dependence: Secondary | ICD-10-CM

## 2017-11-10 DIAGNOSIS — Z90710 Acquired absence of both cervix and uterus: Secondary | ICD-10-CM

## 2017-11-10 DIAGNOSIS — K219 Gastro-esophageal reflux disease without esophagitis: Secondary | ICD-10-CM | POA: Diagnosis present

## 2017-11-10 DIAGNOSIS — Z87442 Personal history of urinary calculi: Secondary | ICD-10-CM

## 2017-11-10 DIAGNOSIS — N179 Acute kidney failure, unspecified: Secondary | ICD-10-CM | POA: Diagnosis present

## 2017-11-10 DIAGNOSIS — M19011 Primary osteoarthritis, right shoulder: Secondary | ICD-10-CM | POA: Diagnosis present

## 2017-11-10 DIAGNOSIS — E049 Nontoxic goiter, unspecified: Secondary | ICD-10-CM | POA: Diagnosis present

## 2017-11-10 DIAGNOSIS — J9611 Chronic respiratory failure with hypoxia: Secondary | ICD-10-CM | POA: Diagnosis present

## 2017-11-10 DIAGNOSIS — D539 Nutritional anemia, unspecified: Secondary | ICD-10-CM | POA: Diagnosis present

## 2017-11-10 DIAGNOSIS — Z7901 Long term (current) use of anticoagulants: Secondary | ICD-10-CM

## 2017-11-10 DIAGNOSIS — I10 Essential (primary) hypertension: Secondary | ICD-10-CM | POA: Diagnosis present

## 2017-11-10 DIAGNOSIS — Z7951 Long term (current) use of inhaled steroids: Secondary | ICD-10-CM | POA: Diagnosis not present

## 2017-11-10 DIAGNOSIS — E785 Hyperlipidemia, unspecified: Secondary | ICD-10-CM | POA: Diagnosis present

## 2017-11-10 DIAGNOSIS — I5043 Acute on chronic combined systolic (congestive) and diastolic (congestive) heart failure: Secondary | ICD-10-CM

## 2017-11-10 DIAGNOSIS — I110 Hypertensive heart disease with heart failure: Principal | ICD-10-CM | POA: Diagnosis present

## 2017-11-10 DIAGNOSIS — Z8673 Personal history of transient ischemic attack (TIA), and cerebral infarction without residual deficits: Secondary | ICD-10-CM

## 2017-11-10 DIAGNOSIS — Z79899 Other long term (current) drug therapy: Secondary | ICD-10-CM

## 2017-11-10 DIAGNOSIS — D649 Anemia, unspecified: Secondary | ICD-10-CM

## 2017-11-10 DIAGNOSIS — Z87892 Personal history of anaphylaxis: Secondary | ICD-10-CM

## 2017-11-10 DIAGNOSIS — Z9842 Cataract extraction status, left eye: Secondary | ICD-10-CM

## 2017-11-10 DIAGNOSIS — I340 Nonrheumatic mitral (valve) insufficiency: Secondary | ICD-10-CM | POA: Diagnosis not present

## 2017-11-10 DIAGNOSIS — Z888 Allergy status to other drugs, medicaments and biological substances status: Secondary | ICD-10-CM

## 2017-11-10 DIAGNOSIS — Z9841 Cataract extraction status, right eye: Secondary | ICD-10-CM

## 2017-11-10 DIAGNOSIS — K5790 Diverticulosis of intestine, part unspecified, without perforation or abscess without bleeding: Secondary | ICD-10-CM | POA: Diagnosis present

## 2017-11-10 DIAGNOSIS — Z8249 Family history of ischemic heart disease and other diseases of the circulatory system: Secondary | ICD-10-CM

## 2017-11-10 DIAGNOSIS — Z961 Presence of intraocular lens: Secondary | ICD-10-CM | POA: Diagnosis present

## 2017-11-10 DIAGNOSIS — J42 Unspecified chronic bronchitis: Secondary | ICD-10-CM | POA: Diagnosis not present

## 2017-11-10 DIAGNOSIS — Z923 Personal history of irradiation: Secondary | ICD-10-CM

## 2017-11-10 DIAGNOSIS — Z803 Family history of malignant neoplasm of breast: Secondary | ICD-10-CM

## 2017-11-10 DIAGNOSIS — Z8701 Personal history of pneumonia (recurrent): Secondary | ICD-10-CM

## 2017-11-10 LAB — CBC WITH DIFFERENTIAL/PLATELET
Abs Immature Granulocytes: 0.02 10*3/uL (ref 0.00–0.07)
BASOS PCT: 1 %
Basophils Absolute: 0 10*3/uL (ref 0.0–0.1)
EOS ABS: 0.1 10*3/uL (ref 0.0–0.5)
Eosinophils Relative: 3 %
HCT: 39.1 % (ref 36.0–46.0)
Hemoglobin: 11.5 g/dL — ABNORMAL LOW (ref 12.0–15.0)
Immature Granulocytes: 1 %
Lymphocytes Relative: 12 %
Lymphs Abs: 0.5 10*3/uL — ABNORMAL LOW (ref 0.7–4.0)
MCH: 29.3 pg (ref 26.0–34.0)
MCHC: 29.4 g/dL — ABNORMAL LOW (ref 30.0–36.0)
MCV: 99.5 fL (ref 80.0–100.0)
MONO ABS: 0.5 10*3/uL (ref 0.1–1.0)
MONOS PCT: 12 %
NEUTROS ABS: 3.2 10*3/uL (ref 1.7–7.7)
NEUTROS PCT: 71 %
PLATELETS: 214 10*3/uL (ref 150–400)
RBC: 3.93 MIL/uL (ref 3.87–5.11)
RDW: 11.6 % (ref 11.5–15.5)
WBC: 5 10*3/uL (ref 4.0–10.5)
nRBC: 0 % (ref 0.0–0.2)

## 2017-11-10 LAB — BASIC METABOLIC PANEL
Anion gap: 7 (ref 5–15)
BUN: 18 mg/dL (ref 8–23)
CHLORIDE: 102 mmol/L (ref 98–111)
CO2: 33 mmol/L — AB (ref 22–32)
Calcium: 9 mg/dL (ref 8.9–10.3)
Creatinine, Ser: 1.01 mg/dL — ABNORMAL HIGH (ref 0.44–1.00)
GFR calc Af Amer: 55 mL/min — ABNORMAL LOW (ref >60)
GFR calc non Af Amer: 48 mL/min — ABNORMAL LOW (ref >60)
Glucose, Bld: 106 mg/dL — ABNORMAL HIGH (ref 70–99)
POTASSIUM: 3.7 mmol/L (ref 3.5–5.1)
Sodium: 142 mmol/L (ref 135–145)

## 2017-11-10 LAB — BRAIN NATRIURETIC PEPTIDE: B NATRIURETIC PEPTIDE 5: 1411.4 pg/mL — AB (ref 0.0–100.0)

## 2017-11-10 LAB — I-STAT TROPONIN, ED: Troponin i, poc: 0.08 ng/mL (ref 0.00–0.08)

## 2017-11-10 MED ORDER — ALBUTEROL SULFATE HFA 108 (90 BASE) MCG/ACT IN AERS
2.0000 | INHALATION_SPRAY | Freq: Four times a day (QID) | RESPIRATORY_TRACT | Status: DC | PRN
  Filled 2017-11-10: qty 6.7

## 2017-11-10 MED ORDER — VITAMIN D 25 MCG (1000 UNIT) PO TABS
1000.0000 [IU] | ORAL_TABLET | Freq: Every day | ORAL | Status: DC
  Administered 2017-11-10 – 2017-11-15 (×6): 1000 [IU] via ORAL
  Filled 2017-11-10 (×2): qty 1

## 2017-11-10 MED ORDER — DOCUSATE SODIUM 100 MG PO CAPS: 100.0000 mg | ORAL_CAPSULE | Freq: Two times a day (BID) | ORAL | Status: DC | PRN

## 2017-11-10 MED ORDER — FLUTICASONE PROPIONATE 50 MCG/ACT NA SUSP: 1.0000 | Freq: Every day | NASAL | Status: DC | PRN

## 2017-11-10 MED ORDER — ALBUTEROL SULFATE (2.5 MG/3ML) 0.083% IN NEBU: 2.5000 mg | INHALATION_SOLUTION | Freq: Four times a day (QID) | RESPIRATORY_TRACT | Status: DC | PRN

## 2017-11-10 MED ORDER — LORATADINE 10 MG PO TABS
10.0000 mg | ORAL_TABLET | Freq: Every day | ORAL | Status: DC
  Administered 2017-11-10 – 2017-11-15 (×6): 10 mg via ORAL
  Filled 2017-11-10 (×6): qty 1

## 2017-11-10 MED ORDER — APIXABAN 5 MG PO TABS
5.0000 mg | ORAL_TABLET | Freq: Two times a day (BID) | ORAL | Status: DC
  Administered 2017-11-10 – 2017-11-15 (×11): 5 mg via ORAL
  Filled 2017-11-10 (×11): qty 1

## 2017-11-10 MED ORDER — POLYETHYLENE GLYCOL 3350 17 G PO PACK: 17.0000 g | PACK | Freq: Every day | ORAL | Status: DC | PRN

## 2017-11-10 MED ORDER — SODIUM CHLORIDE 0.9 % IV SOLN: 250.0000 mL | INTRAVENOUS | Status: DC | PRN

## 2017-11-10 MED ORDER — ONDANSETRON HCL 4 MG/2ML IJ SOLN: 4.0000 mg | Freq: Four times a day (QID) | INTRAMUSCULAR | Status: DC | PRN

## 2017-11-10 MED ORDER — FUROSEMIDE 10 MG/ML IJ SOLN
40.0000 mg | Freq: Two times a day (BID) | INTRAMUSCULAR | Status: DC
  Administered 2017-11-10 – 2017-11-15 (×10): 40 mg via INTRAVENOUS
  Filled 2017-11-10 (×10): qty 4

## 2017-11-10 MED ORDER — FERROUS SULFATE 325 (65 FE) MG PO TABS
325.0000 mg | ORAL_TABLET | Freq: Two times a day (BID) | ORAL | Status: DC
  Administered 2017-11-10 – 2017-11-14 (×9): 325 mg via ORAL
  Filled 2017-11-10 (×11): qty 1

## 2017-11-10 MED ORDER — SODIUM CHLORIDE 0.9% FLUSH
3.0000 mL | Freq: Two times a day (BID) | INTRAVENOUS | Status: DC
  Administered 2017-11-10 – 2017-11-15 (×10): 3 mL via INTRAVENOUS

## 2017-11-10 MED ORDER — ACETAMINOPHEN 325 MG PO TABS
650.0000 mg | ORAL_TABLET | ORAL | Status: DC | PRN
  Administered 2017-11-10 – 2017-11-14 (×7): 650 mg via ORAL
  Filled 2017-11-10 (×7): qty 2

## 2017-11-10 MED ORDER — IPRATROPIUM-ALBUTEROL 0.5-2.5 (3) MG/3ML IN SOLN
3.0000 mL | Freq: Once | RESPIRATORY_TRACT | Status: AC
  Administered 2017-11-10: 3 mL via RESPIRATORY_TRACT
  Filled 2017-11-10: qty 3

## 2017-11-10 MED ORDER — POLYVINYL ALCOHOL 1.4 % OP SOLN: 1.0000 [drp] | OPHTHALMIC | Status: DC | PRN

## 2017-11-10 MED ORDER — PANTOPRAZOLE SODIUM 40 MG PO TBEC
40.0000 mg | DELAYED_RELEASE_TABLET | Freq: Every day | ORAL | Status: DC
Start: 2017-11-10 — End: 2017-11-15
  Administered 2017-11-10 – 2017-11-15 (×6): 40 mg via ORAL
  Filled 2017-11-10 (×6): qty 1

## 2017-11-10 MED ORDER — SODIUM CHLORIDE 0.9% FLUSH: 3.0000 mL | INTRAVENOUS | Status: DC | PRN

## 2017-11-10 MED ORDER — FUROSEMIDE 10 MG/ML IJ SOLN
40.0000 mg | Freq: Once | INTRAMUSCULAR | Status: AC
  Administered 2017-11-10: 40 mg via INTRAVENOUS
  Filled 2017-11-10: qty 4

## 2017-11-10 MED ORDER — DIPHENHYDRAMINE-APAP (SLEEP) 25-500 MG PO TABS: 1.0000 | ORAL_TABLET | Freq: Every evening | ORAL | Status: DC | PRN

## 2017-11-10 NOTE — Progress Notes (Signed)
Patient c/o now subsided leg cramps. Provided mustard packet.

## 2017-11-10 NOTE — ED Notes (Signed)
Pt placed on purewick, daughter and hospitalist bedside

## 2017-11-10 NOTE — ED Triage Notes (Signed)
Pt was at home and  Woke up the clear her throat and she felt like she could not catch her breath. Pt stated she has a congestion in her nose and head. Pt is on 2L nasal at home at all time.

## 2017-11-10 NOTE — ED Provider Notes (Signed)
[facility] EMERGENCY DEPARTMENT Provider Note   CSN: [id number] Arrival date & time: 11/10/17  0442     History   Chief Complaint Chief Complaint  Patient presents with  . Shortness of Breath    HPI [name] is a 84 y.o. female.  The history is provided by the patient and a relative.  She has a history of COPD, CHF, hypertension, paroxysmal atrial fibrillation, stroke and comes in because of difficulty breathing tonight.  She states that it felt like her nose was stopped up and she could not breathe.  She gave herself several nebulizer treatments which seemed to help loosen Linton Rump, and she used some nose sprays which have helped.  She was able to cough up some thick, gray sputum.  She denies chest pain, heaviness, tightness, pressure.  She denies fever or chills.  She denies nausea or vomiting or diaphoresis.  She has recently developed some leg swelling.  For the last 3 weeks, she has been unable to sleep in bed and has been sleeping in a recliner instead.290  Past Medical History:  Diagnosis Date  . Anemia    years ago  . Arthritis    "right shoulder" (01/02/2012)  . Bladder mass   . CAP (community acquired pneumonia) 01/02/2012   Lower lobe   . CHF (congestive heart failure) ([place])   . COPD (chronic obstructive pulmonary disease) ([place])   . Difficulty sleeping   . Diverticulitis of colon with perforation 01/14/2012   That is post sigmoid resection with [facility] pouch and end colostomy   . GERD (gastroesophageal reflux disease)    occasional  . Goiter    radioactive iodine ablation/notes 01/02/2012  . History of kidney stones    X 1  . Hypertension   . Hypothyroidism    "I have taken Synthroid before" (01/02/2012)   HAS HAD RADIOACTIVE IODINE TX 2 YR S AGO  . Intra-abdominal abscess ([place]) 01/14/2012  . PAF (paroxysmal atrial fibrillation) ([place])   . Pneumatosis of intestines 01/14/2012  . Pneumonia 01/02/2012; ? 2009  . Radiation 08/10/13-09/15/13   57 gray to  vaginal/bladder mass  . SOB (shortness of breath)    'all the time" (01/02/2012)  . Stroke ([place])   . UTI (lower urinary tract infection) 01/03/2012    Patient Active Problem List   Diagnosis Date Noted  . CAP (community acquired pneumonia) 04/28/2017  . GERD (gastroesophageal reflux disease) 04/28/2017  . LLQ abdominal pain   . Gait abnormality 05/18/2016  . History of squamous cell carcinoma 03/04/2016  . Chest pain 12/28/2015  . Chronic respiratory failure, unsp w hypoxia or hypercapnia (HCC) 12/15/2015  . PAF (paroxysmal atrial fibrillation) ([place]) 08/27/2015  . Essential hypertension 06/15/2015  . Squamous cell carcinoma, metastatic ([place]) 06/15/2015  . Acute CVA (cerebrovascular accident) ([place]) 04/13/2015  . Cerebrovascular accident (CVA) due to embolism of left middle cerebral artery ([place])   . HLD (hyperlipidemia)   . Cerebral thrombosis with cerebral infarction 04/12/2015  . Stroke-like symptoms   . Dysarthria 04/11/2015  . Absolute anemia 09/04/2014  . Elevated troponin 02/14/2014  . Dyspnea   . Overweight (BMI 25.0-29.9) 02/13/2014  . Goiter 02/13/2014  . Chronic systolic CHF (congestive heart failure) ([place]) 02/13/2014  . Squamous cell carcinoma of unknown origin 07/24/2013  . S/P colostomy takedown 10/21/2012 10/23/2012  . Diverticulosis of colon 03/07/2012  . Pulmonary nodules 01/23/2012  . Hematuria, microscopic 01/14/2012  . Asymptomatic PVCs 01/03/2012  . H/O: hypothyroidism 01/03/2012  . COPD (chronic  obstructive pulmonary disease) ([place]) 01/02/2012  . Hypertension 01/02/2012  . Chronic combined systolic and diastolic CHF (congestive heart failure) ([place]) 01/02/2012  . Hx of goiter 01/02/2012    Past Surgical History:  Procedure Laterality Date  . ABDOMINAL HYSTERECTOMY  1980's  . APPENDECTOMY  1980's   "when they did hysterectomy" (01/02/2012)  . CATARACT EXTRACTION W/ INTRAOCULAR LENS  IMPLANT, BILATERAL  ?1990's   [place]  . COLON SURGERY    . COLOSTOMY N/A  03/12/2012   Procedure: COLOSTOMY;  Surgeon: Ralene Ok, MD;  Location: [facility];  Service: General;  Laterality: N/A;  . COLOSTOMY REVISION N/A 03/12/2012   Procedure: COLON RESECTION SIGMOID ;  Surgeon: Ralene Ok, MD;  Location: [facility];  Service: General;  Laterality: N/A;  . COLOSTOMY TAKEDOWN N/A 10/21/2012   Procedure: LAPAROSCOPIC COLOSTOMY TAKEDOWN AND HARTMANS ANASTOMOSIS, rigid proctoscopy;  Surgeon: Ralene Ok, MD;  Location: [facility];  Service: General;  Laterality: N/A;  . EYE SURGERY    . LYSIS OF ADHESION N/A 10/21/2012   Procedure: LYSIS OF ADHESION;  Surgeon: Ralene Ok, MD;  Location: [facility];  Service: General;  Laterality: N/A;  . TRANSURETHRAL RESECTION OF BLADDER TUMOR WITH GYRUS (TURBT-GYRUS) N/A 06/30/2013   Procedure: TRANSURETHRAL RESECTION OF BLADDER TUMOR WITH GYRUS (TURBT-GYRUS)/VAGINAL BIOPSY;  Surgeon: Ardis Hughs, MD;  Location: [facility];  Service: Urology;  Laterality: N/A;     OB History   None      Home Medications    Prior to Admission medications   Medication Sig Start Date End Date Taking? Authorizing Provider  acetaminophen (TYLENOL) 500 MG tablet Take 1,000 mg by mouth every 6 (six) hours as needed for mild pain. Reported on 07/14/2015    [provider]  albuterol (PROVENTIL HFA;VENTOLIN HFA) 108 (90 BASE) MCG/ACT inhaler Inhale 2 puffs into the lungs every 6 (six) hours as needed for wheezing.    [provider]  albuterol (PROVENTIL) (2.5 MG/3ML) 0.083% nebulizer solution Take 3 mLs (2.5 mg total) by nebulization every 6 (six) hours as needed for wheezing or shortness of breath. 12/17/15   Elgergawy, Silver Huguenin, MD  apixaban (ELIQUIS) 5 MG TABS tablet Take 1 tablet (5 mg total) by mouth 2 (two) times daily. 08/28/15   Ghimire, Henreitta Leber, MD  cetirizine (ZYRTEC) 10 MG tablet Take 10 mg by mouth daily.    [provider]  cholecalciferol (VITAMIN D) 1000 units tablet Take 1,000 Units by mouth daily.     [provider]  diphenhydramine-acetaminophen (TYLENOL PM) 25-500 MG TABS tablet Take 1 tablet by mouth at bedtime as needed (sleep).     [provider]  docusate sodium (COLACE) 100 MG capsule Take 1 capsule (100 mg total) by mouth 2 (two) times daily. 05/01/17   Oretha Milch D, MD  ferrous sulfate 325 (65 FE) MG tablet Take 325 mg by mouth 2 (two) times daily with a meal.     [provider]  fluticasone (FLONASE) 50 MCG/ACT nasal spray  07/19/17   [provider]  furosemide (LASIX) 40 MG tablet Take 1 tablet (40 mg total) by mouth daily. 12/18/15   Elgergawy, Silver Huguenin, MD  omeprazole (PRILOSEC) 20 MG capsule Take 20 mg by mouth 2 (two) times daily.    [provider]  pantoprazole (PROTONIX) 40 MG tablet Take 1 tablet (40 mg total) by mouth daily. 08/14/17   Fenton Foy, NP  polyethylene glycol (MIRALAX / GLYCOLAX) packet Take 17 g by mouth daily.  [provider]  polyvinyl alcohol (LIQUIFILM TEARS) 1.4 % ophthalmic solution Place 1 drop into both eyes as needed for dry eyes.    [provider]    Family History Family History  Problem Relation Age of Onset  . Cancer Sister        Breast  . Stroke Sister   . Heart disease Mother   . Heart disease Unknown     Social History Social History   Tobacco Use  . Smoking status: Former Smoker    Packs/day: 0.12    Years: 35.00    Pack years: 4.20    Types: Cigarettes    Last attempt to quit: 01/02/1991    Years since quitting: 26.8  . Smokeless tobacco: Never Used  Substance Use Topics  . Alcohol use: No  . Drug use: No     Allergies   Indomethacin   Review of Systems Review of Systems  All other systems reviewed and are negative.    Physical Exam Updated Vital Signs BP (!) 147/83 (BP Location: Right Arm)   Pulse 68   Temp 97.8 F (36.6 C) (Oral)   Resp 12   Ht 5' (1.524 m)   Wt 65.8 kg   SpO2 99%   BMI 28.32 kg/m   Physical Exam  Nursing  note and vitals reviewed.  84 year old female, resting comfortably and in no acute distress. Vital signs are significant for elevated systolic blood pressure. Oxygen saturation is 99%, which is normal.  Although she is not using accessory muscles of respiration, she is breathing more heavily than normal, and has difficulty completing a sentence without taking a breath. Head is normocephalic and atraumatic. PERRLA, EOMI. Oropharynx is clear. Neck is nontender and supple without adenopathy or JVD. Back is nontender and there is no CVA tenderness. Lungs have decreased airflow diffusely without rales, wheezes, or rhonchi. Chest is nontender. Heart has regular rate and rhythm without murmur. Abdomen is soft, flat, nontender without masses or hepatosplenomegaly and peristalsis is normoactive. Extremities have 1+ edema, full range of motion is present. Skin is warm and dry without rash. Neurologic: Mental status is normal, cranial nerves are intact, there are no motor or sensory deficits.  ED Treatments / Results  Labs (all labs ordered are listed, but only abnormal results are displayed) Labs Reviewed  BASIC METABOLIC PANEL - Abnormal; Notable for the following components:      Result Value   CO2 33 (*)    Glucose, Bld 106 (*)    Creatinine, Ser 1.01 (*)    GFR calc non Af Amer 48 (*)    GFR calc Af Amer 55 (*)    All other components within normal limits  CBC WITH DIFFERENTIAL/PLATELET - Abnormal; Notable for the following components:   Hemoglobin 11.5 (*)    MCHC 29.4 (*)    Lymphs Abs 0.5 (*)    All other components within normal limits  BRAIN NATRIURETIC PEPTIDE - Abnormal; Notable for the following components:   B Natriuretic Peptide 1,411.4 (*)    All other components within normal limits  I-STAT TROPONIN, ED    EKG EKG Interpretation  Date/Time:  Sunday November 10 2017 04:52:57 EST Ventricular Rate:  77 PR Interval:    QRS Duration: 153 QT Interval:  398 QTC  Calculation: 451 R Axis:   -85 Text Interpretation:  Sinus rhythm Multiform ventricular premature complexes Nonspecific IVCD with LAD Left ventricular hypertrophy Borderline T abnormalities, lateral leads When compared with ECG of  04/30/2017, No significant change was found Confirmed by Delora Fuel ([id number]) on 11/10/2017 5:00:26 AM   Radiology Dg Chest 2 View  Result Date: 11/10/2017 CLINICAL DATA:  Acute onset of nasal and head congestion. Shortness of breath. EXAM: CHEST - 2 VIEW COMPARISON:  Chest radiograph performed 08/14/2017 FINDINGS: The lungs are well-aerated. Vascular congestion is noted. Increased interstitial markings raise concern for mild interstitial edema. There is no evidence of pleural effusion or pneumothorax. The heart is mildly enlarged. No acute osseous abnormalities are seen. Dense calcifications are noted at the enlarged thyroid gland. Degenerative change is noted at the right glenohumeral joint. IMPRESSION: Vascular congestion and mild cardiomegaly. Increased interstitial markings raise concern for mild interstitial edema. Electronically Signed   By: Garald Balding M.D.   On: 11/10/2017 06:15    Procedures Procedures   Medications Ordered in ED Medications  ipratropium-albuterol (DUONEB) 0.5-2.5 (3) MG/3ML nebulizer solution 3 mL (3 mLs Nebulization Given 11/10/17 0510)  furosemide (LASIX) injection 40 mg (40 mg Intravenous Given 11/10/17 0829)     Initial Impression / Assessment and Plan / ED Course  I have reviewed the triage vital signs and the nursing notes.  Pertinent labs & imaging results that were available during my care of the patient were reviewed by me and considered in my medical decision making (see chart for details).  Dyspnea which could be CHF or COPD or combination.  We will give nebulizer treatment with albuterol and ipratropium.  Will check chest x-ray and BNP.  Old records are reviewed, and she has hospital admissions for CHF and  pneumonia.  Chest x-ray shows increasing pulmonary vascular congestion with mild pulmonary edema.  ECG shows no acute changes.  Labs are significant for markedly elevated BNP.  Mild anemia is present which is unchanged from baseline.  She is given a dose of furosemide.  She will need to be admitted for diuresis.  Case is discussed with Dr. Daryll Drown of [facility], who agrees to admit the patient.  Final Clinical Impressions(s) / ED Diagnoses   Final diagnoses:  Acute on chronic combined systolic and diastolic heart failure (HCC)  Normochromic normocytic anemia    ED Discharge Orders    None       Delora Fuel, MD 65/46/50 [phone]

## 2017-11-10 NOTE — H&P (Signed)
History and Physical    MEKAILA TARNOW [name]:[id number] DOB: Jun 29, 1925 DOA: 11/10/2017  PCP: Leonard Downing, MD  Patient coming from: Home   Chief Complaint: SOB  HPI: [name] is a 84 y.o. female with medical history significant of h/o stroke, PAF, hypothyroidism (not on therapy), HTN, GERD, COPD, chronic systolic CHF (EF of 83-38%), h/o bladder mass s/p surgery, anemia who presents for SOB.  [name] reports that she has had slowly worsening symptoms for the last few weeks.  These include mildly worsening SOB, fatigue.  She got so bad today that her energy and breath precluded her from taking a shower.  Over the last few days she has not been able to lay flat and has been sleeping in a recliner for 4 weeks.  She has had no PND or chest pain.  She has had swelling in her ankles/feet, cough, thick mucus.  She denies fever, chills, nausea, vomiting, CP or palpitations.  She does not adhere to a low salt diet per her daughter as she loves breakfast meats that are very salty.  She is taking her lasix as prescribed. S he is 85 years old, has a history of Afib and hypothyroidism but is not treated with any medications at this time.    ED Course: In the ED, she was found to have a BNP > 1000, which is increased from previous.  She had a CXR with pulmonary edema.  Her dry weight is 140 pounds per her, and she is only up about 2 pounds.  She was given IV lasix and placed on oxygen.  She was saturating well at her home oxygen dose of 3LNC (she is on 3L during the day and 2L at rest)  Review of Systems: As per HPI otherwise 10 point review of systems negative.    Past Medical History:  Diagnosis Date  . Anemia    years ago  . Arthritis    "right shoulder" (01/02/2012)  . Bladder mass   . CAP (community acquired pneumonia) 01/02/2012   Lower lobe   . CHF (congestive heart failure) ([place])   . COPD (chronic obstructive pulmonary disease) ([place])   . Difficulty sleeping   . Diverticulitis of colon  with perforation 01/14/2012   That is post sigmoid resection with [facility] pouch and end colostomy   . GERD (gastroesophageal reflux disease)    occasional  . Goiter    radioactive iodine ablation/notes 01/02/2012  . History of kidney stones    X 1  . Hypertension   . Hypothyroidism    "I have taken Synthroid before" (01/02/2012)   HAS HAD RADIOACTIVE IODINE TX 2 YR S AGO  . Intra-abdominal abscess ([place]) 01/14/2012  . PAF (paroxysmal atrial fibrillation) ([place])   . Pneumatosis of intestines 01/14/2012  . Pneumonia 01/02/2012; ? 2009  . Radiation 08/10/13-09/15/13   57 gray to vaginal/bladder mass  . SOB (shortness of breath)    'all the time" (01/02/2012)  . Stroke ([place])   . UTI (lower urinary tract infection) 01/03/2012    Past Surgical History:  Procedure Laterality Date  . ABDOMINAL HYSTERECTOMY  1980's  . APPENDECTOMY  1980's   "when they did hysterectomy" (01/02/2012)  . CATARACT EXTRACTION W/ INTRAOCULAR LENS  IMPLANT, BILATERAL  ?1990's   Bil  . COLON SURGERY    . COLOSTOMY N/A 03/12/2012   Procedure: COLOSTOMY;  Surgeon: Ralene Ok, MD;  Location: [place];  Service: General;  Laterality: N/A;  . COLOSTOMY REVISION N/A 03/12/2012  Procedure: COLON RESECTION SIGMOID ;  Surgeon: Ralene Ok, MD;  Location: [place];  Service: General;  Laterality: N/A;  . COLOSTOMY TAKEDOWN N/A 10/21/2012   Procedure: LAPAROSCOPIC COLOSTOMY TAKEDOWN AND HARTMANS ANASTOMOSIS, rigid proctoscopy;  Surgeon: Ralene Ok, MD;  Location: [facility];  Service: General;  Laterality: N/A;  . EYE SURGERY    . LYSIS OF ADHESION N/A 10/21/2012   Procedure: LYSIS OF ADHESION;  Surgeon: Ralene Ok, MD;  Location: [facility];  Service: General;  Laterality: N/A;  . TRANSURETHRAL RESECTION OF BLADDER TUMOR WITH GYRUS (TURBT-GYRUS) N/A 06/30/2013   Procedure: TRANSURETHRAL RESECTION OF BLADDER TUMOR WITH GYRUS (TURBT-GYRUS)/VAGINAL BIOPSY;  Surgeon: Ardis Hughs, MD;  Location: [facility];  Service: Urology;   Laterality: N/A;   Reviewed with patient and family.   reports that she quit smoking about 26 years ago. Her smoking use included cigarettes. She has a 4.20 pack-year smoking history. She has never used smokeless tobacco. She reports that she does not drink alcohol or use drugs.  Allergies  Allergen Reactions  . Indomethacin Anaphylaxis and Other (See Comments)    "took it fine for awhile; one day I stopped breathing and I ended up in the hospital" (01/02/2012) Pt has tolerated aspirin   Reviewed with patient.  Family History  Problem Relation Age of Onset  . Cancer Sister        Breast  . Stroke Sister   . Heart disease Mother   . Heart disease Unknown     Prior to Admission medications   Medication Sig Start Date End Date Taking? Authorizing Provider  acetaminophen (TYLENOL) 500 MG tablet Take 1,000 mg by mouth every 6 (six) hours as needed for mild pain. Reported on 07/14/2015   Yes [provider]  albuterol (PROVENTIL HFA;VENTOLIN HFA) 108 (90 BASE) MCG/ACT inhaler Inhale 2 puffs into the lungs every 6 (six) hours as needed for wheezing.   Yes [provider]  albuterol (PROVENTIL) (2.5 MG/3ML) 0.083% nebulizer solution Take 3 mLs (2.5 mg total) by nebulization every 6 (six) hours as needed for wheezing or shortness of breath. 12/17/15  Yes Elgergawy, Silver Huguenin, MD  apixaban (ELIQUIS) 5 MG TABS tablet Take 1 tablet (5 mg total) by mouth 2 (two) times daily. 08/28/15  Yes Ghimire, Henreitta Leber, MD  cetirizine (ZYRTEC) 10 MG tablet Take 10 mg by mouth daily as needed for allergies.    Yes [provider]  cholecalciferol (VITAMIN D) 1000 units tablet Take 1,000 Units by mouth daily.   Yes [provider]  diphenhydramine-acetaminophen (TYLENOL PM) 25-500 MG TABS tablet Take 1 tablet by mouth at bedtime as needed (sleep).    Yes [provider]  docusate sodium (COLACE) 100 MG capsule Take 1 capsule (100 mg total) by mouth 2 (two) times  daily. Patient taking differently: Take 100 mg by mouth 2 (two) times daily as needed for mild constipation.  05/01/17  Yes Oretha Milch D, MD  ferrous sulfate 325 (65 FE) MG tablet Take 325 mg by mouth 2 (two) times daily with a meal.    Yes [provider]  fluticasone (FLONASE) 50 MCG/ACT nasal spray Place 1 spray into both nostrils daily as needed for allergies.  07/19/17  Yes [provider]  furosemide (LASIX) 40 MG tablet Take 1 tablet (40 mg total) by mouth daily. 12/18/15  Yes Elgergawy, Silver Huguenin, MD  pantoprazole (PROTONIX) 40 MG tablet Take 1 tablet (40 mg total) by mouth daily. 08/14/17  Yes Fenton Foy,  NP  polyethylene glycol (MIRALAX / GLYCOLAX) packet Take 17 g by mouth daily as needed for mild constipation.    Yes [provider]  polyvinyl alcohol (LIQUIFILM TEARS) 1.4 % ophthalmic solution Place 1 drop into both eyes as needed for dry eyes.   Yes [provider]    Physical Exam:  Constitutional: Sitting up in bed, speaking in short sentences due to SOB Vitals:   11/10/17 0845 11/10/17 1000 11/10/17 1012 11/10/17 1347  BP: (!) 148/86  (!) 109/53 (!) 131/95  Pulse: 72  70 70  Resp: (!) 33  18 18  Temp:   98 F (36.7 C) 98.2 F (36.8 C)  TempSrc:   Oral Oral  SpO2: 100%  99% 96%  Weight:  69.9 kg    Height:  5' (1.524 m)     Eyes:lids and conjunctivae normal for age ENMT: Mucous membranes are moist, dentition is poor Neck: normal, supple, + JVD to line of the jaw, large mobile goiter, seen best on the right.  Respiratory: Crackles in bases, otherwise clear, no wheezing Cardiovascular: RR, irregular, + murmur Abdomen: +BS, NT, ND Musculoskeletal: She has 1+ pitting to mid calves bilaterally, no clubbing or cyanosis.  Skin: no rashes, lesions, ulcers on exposed skin Neurologic: CN 2-12 grossly intact. Strength 4/5 in all 4.  Psychiatric: Normal judgment and insight. Normal mood.    Labs on Admission: I have personally  reviewed following labs and imaging studies  CBC: Recent Labs  Lab 11/10/17 0500  WBC 5.0  NEUTROABS 3.2  HGB 11.5*  HCT 39.1  MCV 99.5  PLT 562   Basic Metabolic Panel: Recent Labs  Lab 11/10/17 0500  NA 142  K 3.7  CL 102  CO2 33*  GLUCOSE 106*  BUN 18  CREATININE 1.01*  CALCIUM 9.0   GFR: Estimated Creatinine Clearance: 32.3 mL/min (A) (by C-G formula based on SCr of 1.01 mg/dL (H)). Liver Function Tests: No results for input(s): AST, ALT, ALKPHOS, BILITOT, PROT, ALBUMIN in the last 168 hours. No results for input(s): LIPASE, AMYLASE in the last 168 hours. No results for input(s): AMMONIA in the last 168 hours. Coagulation Profile: No results for input(s): INR, PROTIME in the last 168 hours. Cardiac Enzymes: No results for input(s): CKTOTAL, CKMB, CKMBINDEX, TROPONINI in the last 168 hours. BNP (last 3 results) No results for input(s): PROBNP in the last 8760 hours. HbA1C: No results for input(s): HGBA1C in the last 72 hours. CBG: No results for input(s): GLUCAP in the last 168 hours. Lipid Profile: No results for input(s): CHOL, HDL, LDLCALC, TRIG, CHOLHDL, LDLDIRECT in the last 72 hours. Thyroid Function Tests: No results for input(s): TSH, T4TOTAL, FREET4, T3FREE, THYROIDAB in the last 72 hours. Anemia Panel: No results for input(s): VITAMINB12, FOLATE, FERRITIN, TIBC, IRON, RETICCTPCT in the last 72 hours. Urine analysis:    Component Value Date/Time   COLORURINE YELLOW 07/22/2017 [place] 07/22/2017 1204   LABSPEC 1.021 07/22/2017 1204   LABSPEC 1.010 10/29/2013 1105   PHURINE 6.0 07/22/2017 1204   GLUCOSEU NEGATIVE 07/22/2017 1204   GLUCOSEU Negative 10/29/2013 1105   HGBUR NEGATIVE 07/22/2017 1204   BILIRUBINUR NEGATIVE 07/22/2017 1204   BILIRUBINUR Negative 10/29/2013 1105   KETONESUR NEGATIVE 07/22/2017 1204   PROTEINUR 30 (A) 07/22/2017 1204   UROBILINOGEN 1.0 02/13/2014 1137   UROBILINOGEN 0.2 10/29/2013 1105   NITRITE  NEGATIVE 07/22/2017 1204   LEUKOCYTESUR SMALL (A) 07/22/2017 1204   LEUKOCYTESUR Trace 10/29/2013 1105  Radiological Exams on Admission: Dg Chest 2 View  Result Date: 11/10/2017 CLINICAL DATA:  Acute onset of nasal and head congestion. Shortness of breath. EXAM: CHEST - 2 VIEW COMPARISON:  Chest radiograph performed 08/14/2017 FINDINGS: The lungs are well-aerated. Vascular congestion is noted. Increased interstitial markings raise concern for mild interstitial edema. There is no evidence of pleural effusion or pneumothorax. The heart is mildly enlarged. No acute osseous abnormalities are seen. Dense calcifications are noted at the enlarged thyroid gland. Degenerative change is noted at the right glenohumeral joint. IMPRESSION: Vascular congestion and mild cardiomegaly. Increased interstitial markings raise concern for mild interstitial edema. Electronically Signed   By: Garald Balding M.D.   On: 11/10/2017 06:15    EKG: Independently reviewed. Sinus with PVCs, prolonged QRS.  History of LBBB based on previous EKGs.    Assessment/Plan Acute on chronic systolic (congestive) heart failure - acute exacerbation likely related to progressive worsening of breathing, LE swelling, JVD, pulmonary edema on CXR and rise in BNP - IV lasix 40mg  BID for today at least - Monitor renal function daily - Daily weights - Strict I/O - Advised to decrease salty foods like sausage, bacon - Heart healthy diet - Reviewed last pulmonary and cardiology notes - she has chronic intermittent swelling of her legs/feet for which she has doubled her lasix in the past.  She also has a large goiter which is making her breathing more difficult at baseline, given age and debility, she is not a surgical candidate per chart review.  - PT evaluation - Oxygen to keep O2 saturation > 92%  AKI - Cr has doubled since last check, likely related to acute heart failure - BMET daily, lasix as noted above  Goiter - Monitor,  consider swallowing evaluation if worsened    COPD (chronic obstructive pulmonary disease) - continue PRN albuterol    Hypertension - Reported as a problem, but not currently on any medication - Monitor.  If SBP stays over 150, consider starting a low dose medication such as an ACE inhibitor    Asymptomatic PVCs - Seen on EKG - Telemetry    PAF (paroxysmal atrial fibrillation) - She is not on any rate controlling medications at this time, pulse was < 100 - Continue eliquis      DVT prophylaxis: Eliquis  Code Status: DNR, confirmed with daughter  Family Communication: Daughter and son in room  Disposition Plan: Admit for IV diuretics  Consults called: None, consider cardiology if not improving as expected  Admission status: Telemetry, inpatient    Gilles Chiquito MD [facility] Pager [phone]  If 7PM-7AM, please contact night-coverage [email or web address] Password [email or web address]  11/10/2017, 2:43 PM

## 2017-11-10 NOTE — ED Notes (Signed)
Nurse starting IV and drawing labs. 

## 2017-11-11 ENCOUNTER — Inpatient Hospital Stay (HOSPITAL_COMMUNITY): Payer: Medicare Other

## 2017-11-11 DIAGNOSIS — I340 Nonrheumatic mitral (valve) insufficiency: Secondary | ICD-10-CM

## 2017-11-11 LAB — BASIC METABOLIC PANEL
Anion gap: 5 (ref 5–15)
BUN: 18 mg/dL (ref 8–23)
CALCIUM: 8.6 mg/dL — AB (ref 8.9–10.3)
CO2: 36 mmol/L — ABNORMAL HIGH (ref 22–32)
CREATININE: 1.03 mg/dL — AB (ref 0.44–1.00)
Chloride: 99 mmol/L (ref 98–111)
GFR calc Af Amer: 54 mL/min — ABNORMAL LOW (ref >60)
GFR, EST NON AFRICAN AMERICAN: 46 mL/min — AB (ref >60)
GLUCOSE: 109 mg/dL — AB (ref 70–99)
POTASSIUM: 4 mmol/L (ref 3.5–5.1)
SODIUM: 140 mmol/L (ref 135–145)

## 2017-11-11 LAB — ECHOCARDIOGRAM COMPLETE
Height: 60 in
WEIGHTICAEL: 2449.6 [oz_av]

## 2017-11-11 LAB — POTASSIUM: Potassium: 4.5 mmol/L (ref 3.5–5.1)

## 2017-11-11 LAB — MAGNESIUM: Magnesium: 2 mg/dL (ref 1.7–2.4)

## 2017-11-11 MED ORDER — ALBUTEROL SULFATE (2.5 MG/3ML) 0.083% IN NEBU
2.5000 mg | INHALATION_SOLUTION | RESPIRATORY_TRACT | Status: DC | PRN
  Administered 2017-11-11: 2.5 mg via RESPIRATORY_TRACT
  Filled 2017-11-11: qty 3

## 2017-11-11 MED ORDER — MOMETASONE FURO-FORMOTEROL FUM 100-5 MCG/ACT IN AERO
2.0000 | INHALATION_SPRAY | Freq: Two times a day (BID) | RESPIRATORY_TRACT | Status: DC
  Administered 2017-11-12 – 2017-11-15 (×7): 2 via RESPIRATORY_TRACT
  Filled 2017-11-11: qty 8.8

## 2017-11-11 NOTE — Progress Notes (Signed)
Patient had 13 bts vtach. MD paged made aware.

## 2017-11-11 NOTE — Plan of Care (Signed)
  Problem: Pain Managment: Goal: General experience of comfort will improve Outcome: Progressing   Problem: Safety: Goal: Ability to remain free from injury will improve Outcome: Progressing   

## 2017-11-11 NOTE — Progress Notes (Signed)
  Echocardiogram 2D Echocardiogram has been performed.  Ladarrion Telfair G Calaya Gildner 11/11/2017, 10:55 AM

## 2017-11-11 NOTE — Progress Notes (Signed)
Patient ID: [name], female   DOB: 09-05-1925, 84 y.o.   MRN: [id number]  PROGRESS NOTE    CORNELL GABER  NLG:[id number] DOB: Feb 13, 1925 DOA: 11/10/2017 PCP: Leonard Downing, MD   Brief Narrative:  84 year old female with history of stroke, paroxysmal A. fib, hypothyroidism, hypertension, GERD, COPD, chronic hypoxic respite failure on home oxygen, chronic systolic CHF with EF 35 to 40%, bladder mass status post surgery, anemia presented on 11/10/2017 with shortness of breath.  BNP was more than 1000.  She was started on IV Lasix and admitted for CHF exacerbation   Assessment & Plan:   Active Problems:   COPD (chronic obstructive pulmonary disease) ([facility])   Hypertension   Asymptomatic PVCs   PAF (paroxysmal atrial fibrillation) ([facility])   Acute on chronic systolic (congestive) heart failure ([facility])  Acute on chronic systolic congestive heart failure -Continue IV Lasix.  Strict input and output.  Daily weights.  Negative balance of 1530 cc since admission -2D echo -Fluid restriction.  Monitor creatinine -Outpatient cardiology follow-up  COPD with chronic hypoxic respiratory failure -Not currently wheezing.  Continue PRN nebs.  Will add Dulera.  Continue oxygen supplementation  Hypertension -Monitor blood pressure.  Continue Lasix  Paroxysmal atrial fibrillation -Rate controlled.  Continue Eliquis.  Not on any rate controlling medications  Goiter -Monitor.  This is also probably making her breathing difficult at baseline and given her age and debility, she is not a surgical candidate per chart review.  Generalized deconditioning -PT eval.  DVT prophylaxis: Eliquis Code Status: DNR Family Communication: None at bedside Disposition Plan: Depends on clinical outcome  Consultants: None  Procedures: None  Antimicrobials: None   Subjective: Patient seen and examined at bedside.  She does not feel well.  She is still extremely short of breath.  No overnight fever or  vomiting.  Coughing intermittently  Objective: Vitals:   11/10/17 1347 11/10/17 2055 11/11/17 0036 11/11/17 0506  BP: (!) 131/95 135/72 137/73 126/64  Pulse: 70 71 73 64  Resp: 18 20 18 18   Temp: 98.2 F (36.8 C) 98 F (36.7 C) (!) 97.5 F (36.4 C) 98.2 F (36.8 C)  TempSrc: Oral Oral Oral Oral  SpO2: 96% 95% 96% 98%  Weight:    69.4 kg  Height:        Intake/Output Summary (Last 24 hours) at 11/11/2017 1300 Last data filed at 11/11/2017 1035 Gross per 24 hour  Intake 480 ml  Output 1950 ml  Net -1470 ml   Filed Weights   11/10/17 0450 11/10/17 1000 11/11/17 0506  Weight: 65.8 kg 69.9 kg 69.4 kg    Examination:  General exam: Elderly female sitting on chair in mild distress secondary to shortness of breath.   Respiratory system: Bilateral decreased breath sounds at bases with basilar crackles, no wheezing  cardiovascular system: S1 & S2 heard, Rate controlled Gastrointestinal system: Abdomen is nondistended, soft and nontender. Normal bowel sounds heard. Extremities: No cyanosis, clubbing; trace edema   Data Reviewed: I have personally reviewed following labs and imaging studies  CBC: Recent Labs  Lab 11/10/17 0500  WBC 5.0  NEUTROABS 3.2  HGB 11.5*  HCT 39.1  MCV 99.5  PLT 081   Basic Metabolic Panel: Recent Labs  Lab 11/10/17 0500 11/11/17 0716  NA 142 140  K 3.7 4.0  CL 102 99  CO2 33* 36*  GLUCOSE 106* 109*  BUN 18 18  CREATININE 1.01* 1.03*  CALCIUM 9.0 8.6*   GFR: Estimated Creatinine Clearance: 31.6  mL/min (A) (by C-G formula based on SCr of 1.03 mg/dL (H)). Liver Function Tests: No results for input(s): AST, ALT, ALKPHOS, BILITOT, PROT, ALBUMIN in the last 168 hours. No results for input(s): LIPASE, AMYLASE in the last 168 hours. No results for input(s): AMMONIA in the last 168 hours. Coagulation Profile: No results for input(s): INR, PROTIME in the last 168 hours. Cardiac Enzymes: No results for input(s): CKTOTAL, CKMB, CKMBINDEX,  TROPONINI in the last 168 hours. BNP (last 3 results) No results for input(s): PROBNP in the last 8760 hours. HbA1C: No results for input(s): HGBA1C in the last 72 hours. CBG: No results for input(s): GLUCAP in the last 168 hours. Lipid Profile: No results for input(s): CHOL, HDL, LDLCALC, TRIG, CHOLHDL, LDLDIRECT in the last 72 hours. Thyroid Function Tests: No results for input(s): TSH, T4TOTAL, FREET4, T3FREE, THYROIDAB in the last 72 hours. Anemia Panel: No results for input(s): VITAMINB12, FOLATE, FERRITIN, TIBC, IRON, RETICCTPCT in the last 72 hours. Sepsis Labs: No results for input(s): PROCALCITON, LATICACIDVEN in the last 168 hours.  No results found for this or any previous visit (from the past 240 hour(s)).       Radiology Studies: Dg Chest 2 View  Result Date: 11/10/2017 CLINICAL DATA:  Acute onset of nasal and head congestion. Shortness of breath. EXAM: CHEST - 2 VIEW COMPARISON:  Chest radiograph performed 08/14/2017 FINDINGS: The lungs are well-aerated. Vascular congestion is noted. Increased interstitial markings raise concern for mild interstitial edema. There is no evidence of pleural effusion or pneumothorax. The heart is mildly enlarged. No acute osseous abnormalities are seen. Dense calcifications are noted at the enlarged thyroid gland. Degenerative change is noted at the right glenohumeral joint. IMPRESSION: Vascular congestion and mild cardiomegaly. Increased interstitial markings raise concern for mild interstitial edema. Electronically Signed   By: Garald Balding M.D.   On: 11/10/2017 06:15        Scheduled Meds: . apixaban  5 mg Oral BID  . cholecalciferol  1,000 Units Oral Daily  . ferrous sulfate  325 mg Oral BID WC  . furosemide  40 mg Intravenous Q12H  . loratadine  10 mg Oral Daily  . pantoprazole  40 mg Oral Daily  . sodium chloride flush  3 mL Intravenous Q12H   Continuous Infusions: . sodium chloride       LOS: 1 day         Aline August, MD [facility] Pager [phone]  If 7PM-7AM, please contact night-coverage [email or web address] Password [email or web address] 11/11/2017, 1:00 PM

## 2017-11-11 NOTE — Evaluation (Signed)
Physical Therapy Evaluation Patient Details Name: [name] MRN: [id number] DOB: [birth year] Today's Date: 11/11/2017   History of Present Illness  Patient is a 84 y/o female who presents with SOB and swelling in BLEs. CXR- pulmonary vascular congestion with mild pulmonary edema. BNP>1000. Found to have acute on chronic CHF. PMH includes HTN, paroxysmal A-fib, CHF, CVA, COPD.  Clinical Impression  Patient presents with dyspnea on exertion, generalized deconditioning, weakness, and impaired mobility s/p above. Tolerated short distance ambulation with close Min guard for safety. Sp02 remained >93% on 3L/min 02. Pt reports difficulty coughing up phlegm and prefers to be OOB. Encouraged mobility with nursing throughout the day. Pt lives with daughter, uses rollator for mobility and has support of son who comes by a few times per day. Reports no falls at home. Will follow acutely to maximize independence and mobility prior to return home.     Follow Up Recommendations Home health PT;Supervision for mobility/OOB    Equipment Recommendations  None recommended by PT    Recommendations for Other Services OT consult     Precautions / Restrictions Precautions Precautions: Fall Restrictions Weight Bearing Restrictions: No      Mobility  Bed Mobility Overal bed mobility: Needs Assistance Bed Mobility: Supine to Sit     Supine to sit: Supervision;HOB elevated     General bed mobility comments: Able to get to EOB with use of rail. Mild dizziness which resolved.   Transfers Overall transfer level: Needs assistance Equipment used: Rolling walker (2 wheeled) Transfers: Sit to/from Stand Sit to Stand: Min guard         General transfer comment: Min guard for safety. Stood from [employer]. Transferred to chair post ambulation.  Ambulation/Gait Ambulation/Gait assistance: Min guard Gait Distance (Feet): 20 Feet Assistive device: Rolling walker (2 wheeled) Gait Pattern/deviations:  Step-through pattern;Decreased stride length;Trunk flexed Gait velocity: decreased   General Gait Details: Slow, mildly unsteady gait with cues for RW proximity as RW too far anterior (use t o rollator); 2/4 DOE. Sp02 remained >93% on 3L/min 02.   Stairs            Wheelchair Mobility    Modified Rankin (Stroke Patients Only)       Balance Overall balance assessment: Needs assistance Sitting-balance support: Feet supported;No upper extremity supported Sitting balance-Leahy Scale: Fair     Standing balance support: During functional activity;Bilateral upper extremity supported Standing balance-Leahy Scale: Poor Standing balance comment: Requires BUE support in standing.                              Pertinent Vitals/Pain Pain Assessment: No/denies pain    Home Living Family/patient expects to be discharged to:: Private residence Living Arrangements: Children Available Help at Discharge: Family;Available 24 hours/day Type of Home: House Home Access: Stairs to enter Entrance Stairs-Rails: None Entrance Stairs-Number of Steps: 2 Home Layout: One level Home Equipment: Walker - 4 wheels;Bedside commode;Shower seat Additional Comments: on 2 L O2 at home at rest and 3L for mobility    Prior Function Level of Independence: Needs assistance   Gait / Transfers Assistance Needed: RW for mobility, Pt reports no falls.  ADL's / Homemaking Assistance Needed: modified independent with bathing and dressing. Son assists with meals.        Hand Dominance   Dominant Hand: Right    Extremity/Trunk Assessment   Upper Extremity Assessment Upper Extremity Assessment: Defer to OT evaluation    Lower  Extremity Assessment Lower Extremity Assessment: Generalized weakness    Cervical / Trunk Assessment Cervical / Trunk Assessment: Kyphotic  Communication   Communication: No difficulties  Cognition Arousal/Alertness: Awake/alert Behavior During Therapy: WFL for  tasks assessed/performed Overall Cognitive Status: Within Functional Limits for tasks assessed                                        General Comments General comments (skin integrity, edema, etc.): Pt reports coughing up thick phglem. Sleeps in recliner at home.     Exercises     Assessment/Plan    PT Assessment Patient needs continued PT services  PT Problem List Decreased strength;Decreased mobility;Cardiopulmonary status limiting activity;Decreased activity tolerance;Decreased balance       PT Treatment Interventions Functional mobility training;DME instruction;Balance training;Patient/family education;Gait training;Therapeutic activities;Therapeutic exercise    PT Goals (Current goals can be found in the Care Plan section)  Acute Rehab PT Goals Patient Stated Goal: to be able to breathe and get this stuff out of my lungs PT Goal Formulation: With patient Time For Goal Achievement: 11/25/17 Potential to Achieve Goals: Good    Frequency Min 3X/week   Barriers to discharge        Co-evaluation               AM-PAC PT "6 Clicks" Daily Activity  Outcome Measure Difficulty turning over in bed (including adjusting bedclothes, sheets and blankets)?: None Difficulty moving from lying on back to sitting on the side of the bed? : A Little Difficulty sitting down on and standing up from a chair with arms (e.g., wheelchair, bedside commode, etc,.)?: A Little Help needed moving to and from a bed to chair (including a wheelchair)?: A Little Help needed walking in hospital room?: A Little Help needed climbing 3-5 steps with a railing? : A Lot 6 Click Score: 18    End of Session Equipment Utilized During Treatment: Gait belt;Oxygen Activity Tolerance: Patient tolerated treatment well;Patient limited by fatigue Patient left: in chair;with call bell/phone within reach;with chair alarm set Nurse Communication: Mobility status PT Visit Diagnosis: Unsteadiness  on feet (R26.81);Difficulty in walking, not elsewhere classified (R26.2)    Time: 2500-3704 PT Time Calculation (min) (ACUTE ONLY): 26 min   Charges:   PT Evaluation $PT Eval Moderate Complexity: 1 Mod PT Treatments $Therapeutic Activity: 8-22 mins        Wray Kearns, PT, DPT Acute Rehabilitation Services Pager [phone] Office [place] 11/11/2017, 9:15 AM

## 2017-11-11 NOTE — Progress Notes (Signed)
Patient had 25 bts wide QRS,pt asymptomatic.MD page made aware.

## 2017-11-11 NOTE — Progress Notes (Signed)
Nutrition Brief Note  RD consulted for assessment of nutritional needs/ status due to CHF.   Wt Readings from Last 15 Encounters:  11/11/17 69.4 kg  10/11/17 72 kg  08/14/17 69.9 kg  07/22/17 69.9 kg  05/20/17 68.9 kg  05/08/17 70.8 kg  04/28/17 74.2 kg  01/21/17 71.5 kg  11/27/16 71.7 kg  10/29/16 72.6 kg  09/26/16 72.9 kg  07/19/16 72.5 kg  05/18/16 73 kg  03/05/16 72.3 kg  01/26/16 71.8 kg   [name] is a 84 y.o. female with medical history significant of h/o stroke, PAF, hypothyroidism (not on therapy), HTN, GERD, COPD, chronic systolic CHF (EF of 10-17%), h/o bladder mass s/p surgery, anemia who presents for SOB.   Pt admitted with CHF.   Spoke with pt, who was sitting in recliner chair at time of visit. She reports great appetite, typically consumes 3 meals per day (Breakfast: eggs, sausage or bacon, toast, and grits (prepared by son); Lunch: sandwich; Dinner: spaghetti OR meat, starch, and vegetable OR chicken pot pie (prepared by daughter)). Pt consumes mainly water or lemonade with meals. Pt has been consuming 100% of meals during hospitalization- enjoyed meal of pot roast this afternoon.   Pt shares that she tries to avoid added sodium in diet. She not added salt at the table for years. She reports that her daughter and son will occasionally add salt to season foods, however, she encourages them not to.   Pt weighs herself daily. UBW is around 150#. She will contact daughter and MD if weight goes about 3-4 pounds of usual body weight.   Nutrition-Focused physical exam completed. Findings are no fat depletion, mild muscle depletion, and mild edema. Pt muscle depletion noted in upper and lower extremities, which is common in geriatric population.   Reviewed low sodium diet guidelines with pt. Encouraged pt to continue to not add salt to food and continue to monitor weight daily. Pt with no further questions regarding diet and nutrition, however, expressed appreciation for  visit.   Labs reviewed.   Body mass index is 29.9 kg/m. Patient meets criteria for overweight based on current BMI.   Current diet order is Heart Healthy with 1200 ml fluid restriction, patient is consuming approximately 100% of meals at this time. Labs and medications reviewed.   No nutrition interventions warranted at this time. If nutrition issues arise, please consult RD.   Toshia Larkin A. Jimmye Norman, RD, LDN, CDE Pager: [phone] After hours Pager: [phone]

## 2017-11-12 LAB — CBC WITH DIFFERENTIAL/PLATELET
ABS IMMATURE GRANULOCYTES: 0.02 10*3/uL (ref 0.00–0.07)
BASOS PCT: 1 %
Basophils Absolute: 0 10*3/uL (ref 0.0–0.1)
EOS ABS: 0.2 10*3/uL (ref 0.0–0.5)
Eosinophils Relative: 5 %
HEMATOCRIT: 29.2 % — AB (ref 36.0–46.0)
HEMOGLOBIN: 10.9 g/dL — AB (ref 12.0–15.0)
Immature Granulocytes: 0 %
Lymphocytes Relative: 13 %
Lymphs Abs: 0.6 10*3/uL — ABNORMAL LOW (ref 0.7–4.0)
MCH: 38.5 pg — ABNORMAL HIGH (ref 26.0–34.0)
MCHC: 29.5 g/dL — ABNORMAL LOW (ref 30.0–36.0)
MCV: 103.2 fL — AB (ref 80.0–100.0)
MONO ABS: 0.6 10*3/uL (ref 0.1–1.0)
MONOS PCT: 14 %
Neutro Abs: 3 10*3/uL (ref 1.7–7.7)
Neutrophils Relative %: 67 %
Platelets: 184 10*3/uL (ref 150–400)
RBC: 2.83 MIL/uL — AB (ref 3.87–5.11)
RDW: 13.1 % (ref 11.5–15.5)
WBC: 4.5 10*3/uL (ref 4.0–10.5)
nRBC: 0 % (ref 0.0–0.2)

## 2017-11-12 LAB — BASIC METABOLIC PANEL
Anion gap: 6 (ref 5–15)
BUN: 18 mg/dL (ref 8–23)
CO2: 37 mmol/L — AB (ref 22–32)
CREATININE: 1.19 mg/dL — AB (ref 0.44–1.00)
Calcium: 8.7 mg/dL — ABNORMAL LOW (ref 8.9–10.3)
Chloride: 94 mmol/L — ABNORMAL LOW (ref 98–111)
GFR, EST AFRICAN AMERICAN: 45 mL/min — AB (ref >60)
GFR, EST NON AFRICAN AMERICAN: 39 mL/min — AB (ref >60)
Glucose, Bld: 113 mg/dL — ABNORMAL HIGH (ref 70–99)
Potassium: 4.2 mmol/L (ref 3.5–5.1)
Sodium: 137 mmol/L (ref 135–145)

## 2017-11-12 LAB — MAGNESIUM: MAGNESIUM: 2.2 mg/dL (ref 1.7–2.4)

## 2017-11-12 NOTE — Progress Notes (Signed)
Patient ID: [name], female   DOB: Mar 22, 1925, 84 y.o.   MRN: [id number]  PROGRESS NOTE    [name]  YQM:[id number] DOB: 02-Sep-1925 DOA: 11/10/2017 PCP: Leonard Downing, MD   Brief Narrative:  84 year old female with history of stroke, paroxysmal A. fib, hypothyroidism, hypertension, GERD, COPD, chronic hypoxic respite failure on home oxygen, chronic systolic CHF with EF 35 to 40%, bladder mass status post surgery, anemia presented on 11/10/2017 with shortness of breath.  BNP was more than 1000.  She was started on IV Lasix and admitted for CHF exacerbation   Assessment & Plan:   Active Problems:   COPD (chronic obstructive pulmonary disease) (HCC)   Hypertension   Asymptomatic PVCs   PAF (paroxysmal atrial fibrillation) (HCC)   Acute on chronic systolic (congestive) heart failure (HCC)  Acute on chronic systolic congestive heart failure -Continue IV Lasix 40 mg twice a day for 1 more day and switch to oral Lasix probably tomorrow.  Strict input and output.  Daily weights.  Negative balance of 1810 cc since admission -2D echo shows EF of 30 to 35% with diffuse hypokinesis and grade 1 diastolic dysfunction -Fluid restriction.  Monitor creatinine -Outpatient cardiology follow-up  COPD with chronic hypoxic respiratory failure -Not currently wheezing.  Continue PRN nebs.  Continue Dulera.  Continue oxygen supplementation  Hypertension -Monitor blood pressure.  Continue Lasix  Paroxysmal atrial fibrillation -Rate controlled.  Continue Eliquis.  Not on any rate controlling medications  Goiter -Monitor.  This is also probably making her breathing difficult at baseline and given her age and debility, she is not a surgical candidate per chart review.  Generalized deconditioning -PT eval.  DVT prophylaxis: Eliquis Code Status: DNR Family Communication: None at bedside Disposition Plan: Probable discharge in the next 1 or 2 days if condition improves  Consultants:  None  Procedures:  Echo on 11/11/2017 Study Conclusions  - Left ventricle: The cavity size was normal. Wall thickness was   normal. Systolic function was moderately to severely reduced. The   estimated ejection fraction was in the range of 30% to 35%.   Diffuse hypokinesis. Doppler parameters are consistent with   abnormal left ventricular relaxation (grade 1 diastolic   dysfunction). - Aortic valve: There was no stenosis. - Mitral valve: Moderately calcified annulus. There was mild   regurgitation. - Left atrium: The atrium was severely dilated. - Right ventricle: The cavity size was normal. Systolic function   was normal. - Right atrium: The atrium was mildly dilated. - Tricuspid valve: Peak RV-RA gradient (S): 32 mm Hg. - Pulmonary arteries: PA peak pressure: 40 mm Hg (S). - Systemic veins: IVC measured 2.7 cm with > 50% respirophasic   variation, suggesting RA pressure 8 mmHg.  Impressions:  - Normal LV size with EF 30-35%, diffuse hypokinesis. Normal RV   size and systolic function. Mild pulmonary hypertension. Biatrial   enlargement. Mild mitral regurgitation.  Antimicrobials: None   Subjective: Patient seen and examined at bedside.  She feels slightly better than yesterday.  Her shortness of breath is improving but does not think that she is ready to go home today.  No overnight chest pain, fever or vomiting.  Objective: Vitals:   11/11/17 1926 11/12/17 0532 11/12/17 0754 11/12/17 0830  BP: (!) 149/89 132/67  105/73  Pulse: 74 (!) 57  65  Resp: 20 18    Temp: 98.2 F (36.8 C) 98 F (36.7 C)    TempSrc: Oral Oral    SpO2: 97%  100% 98%   Weight:  70.3 kg    Height:        Intake/Output Summary (Last 24 hours) at 11/12/2017 1109 Last data filed at 11/12/2017 1030 Gross per 24 hour  Intake 720 ml  Output 1000 ml  Net -280 ml   Filed Weights   11/10/17 1000 11/11/17 0506 11/12/17 0532  Weight: 69.9 kg 69.4 kg 70.3 kg    Examination:  General exam:  Elderly female sitting on chair.  No acute distress today.   Respiratory system: Bilateral decreased breath sounds at bases with crackles mostly on the basis.  No wheezing  cardiovascular system: S1 & S2 heard, intermittent bradycardia Gastrointestinal system: Abdomen is nondistended, soft and nontender. Normal bowel sounds heard. Extremities: No cyanosis; trace edema   Data Reviewed: I have personally reviewed following labs and imaging studies  CBC: Recent Labs  Lab 11/10/17 0500 11/12/17 0524  WBC 5.0 4.5  NEUTROABS 3.2 3.0  HGB 11.5* 10.9*  HCT 39.1 29.2*  MCV 99.5 103.2*  PLT 214 024   Basic Metabolic Panel: Recent Labs  Lab 11/10/17 0500 11/11/17 0716 11/11/17 1700 11/12/17 0524  NA 142 140  --  137  K 3.7 4.0 4.5 4.2  CL 102 99  --  94*  CO2 33* 36*  --  37*  GLUCOSE 106* 109*  --  113*  BUN 18 18  --  18  CREATININE 1.01* 1.03*  --  1.19*  CALCIUM 9.0 8.6*  --  8.7*  MG  --   --  2.0 2.2   GFR: Estimated Creatinine Clearance: 27.5 mL/min (A) (by C-G formula based on SCr of 1.19 mg/dL (H)). Liver Function Tests: No results for input(s): AST, ALT, ALKPHOS, BILITOT, PROT, ALBUMIN in the last 168 hours. No results for input(s): LIPASE, AMYLASE in the last 168 hours. No results for input(s): AMMONIA in the last 168 hours. Coagulation Profile: No results for input(s): INR, PROTIME in the last 168 hours. Cardiac Enzymes: No results for input(s): CKTOTAL, CKMB, CKMBINDEX, TROPONINI in the last 168 hours. BNP (last 3 results) No results for input(s): PROBNP in the last 8760 hours. HbA1C: No results for input(s): HGBA1C in the last 72 hours. CBG: No results for input(s): GLUCAP in the last 168 hours. Lipid Profile: No results for input(s): CHOL, HDL, LDLCALC, TRIG, CHOLHDL, LDLDIRECT in the last 72 hours. Thyroid Function Tests: No results for input(s): TSH, T4TOTAL, FREET4, T3FREE, THYROIDAB in the last 72 hours. Anemia Panel: No results for input(s):  VITAMINB12, FOLATE, FERRITIN, TIBC, IRON, RETICCTPCT in the last 72 hours. Sepsis Labs: No results for input(s): PROCALCITON, LATICACIDVEN in the last 168 hours.  No results found for this or any previous visit (from the past 240 hour(s)).       Radiology Studies: No results found.      Scheduled Meds: . apixaban  5 mg Oral BID  . cholecalciferol  1,000 Units Oral Daily  . ferrous sulfate  325 mg Oral BID WC  . furosemide  40 mg Intravenous Q12H  . loratadine  10 mg Oral Daily  . mometasone-formoterol  2 puff Inhalation BID  . pantoprazole  40 mg Oral Daily  . sodium chloride flush  3 mL Intravenous Q12H   Continuous Infusions: . sodium chloride       LOS: 2 days        Aline August, MD [facility] Pager [phone]  If 7PM-7AM, please contact night-coverage [email or web address] Password [email or web address] 11/12/2017, 11:09 AM

## 2017-11-12 NOTE — Care Management Note (Signed)
Case Management Note  Patient Details  Name: PARILEE HALLY MRN: [id number] Date of Birth: Feb 23, 1925  Subjective/Objective:  CHF                 Action/Plan: Patient lives at home with her daughter; PCP is Dr Arelia Sneddon - he will not sign any HHC orders for the patient. He is requesting that the patient is seen in the office after discharge and he will decide if the patient needs [place]; has private insurance with [facility] with prescription drug coverage; [facility] choice [facility]; DME - home oxygen through [facility], walker at home; CM will continue to follow for progression of care.   Expected Discharge Date:  11/12/17               Expected Discharge Plan:  Home/Self Care  Discharge planning Services  CM Consult  Status of Service:  In process, will continue to follow  Sherrilyn Rist [phone] 11/12/2017, 3:54 PM

## 2017-11-13 DIAGNOSIS — J42 Unspecified chronic bronchitis: Secondary | ICD-10-CM

## 2017-11-13 DIAGNOSIS — I480 Paroxysmal atrial fibrillation: Secondary | ICD-10-CM

## 2017-11-13 DIAGNOSIS — I472 Ventricular tachycardia: Secondary | ICD-10-CM

## 2017-11-13 DIAGNOSIS — I5023 Acute on chronic systolic (congestive) heart failure: Secondary | ICD-10-CM

## 2017-11-13 LAB — BASIC METABOLIC PANEL
ANION GAP: 7 (ref 5–15)
BUN: 20 mg/dL (ref 8–23)
CO2: 37 mmol/L — ABNORMAL HIGH (ref 22–32)
Calcium: 9 mg/dL (ref 8.9–10.3)
Chloride: 94 mmol/L — ABNORMAL LOW (ref 98–111)
Creatinine, Ser: 1.12 mg/dL — ABNORMAL HIGH (ref 0.44–1.00)
GFR calc non Af Amer: 42 mL/min — ABNORMAL LOW (ref >60)
GFR, EST AFRICAN AMERICAN: 49 mL/min — AB (ref >60)
GLUCOSE: 108 mg/dL — AB (ref 70–99)
POTASSIUM: 3.9 mmol/L (ref 3.5–5.1)
Sodium: 138 mmol/L (ref 135–145)

## 2017-11-13 LAB — MAGNESIUM: Magnesium: 2.2 mg/dL (ref 1.7–2.4)

## 2017-11-13 NOTE — Progress Notes (Addendum)
Physical Therapy Treatment Patient Details Name: [name] MRN: [id number] DOB: 09/11/1925 Today's Date: 11/13/2017    History of Present Illness Patient is a 84 y/o female admitted 11/10/17 with SOB and swelling in BLEs. CXR showed pulmonary vascular congestion with mild pulmonary edema. Worked up for CHF. PMH includes HTN, paroxysmal A-fib, CHF, CVA, COPD.   PT Comments    Pt slowly progressing with mobility. Able to perform transfers, take steps and perform ADLs with single UE support at supervision-level. Pt easily fatigued with minimal activity; DOE 3/4 after using BSC. SpO2 95% on 3L O2 [place]. Increased time spent discussing fall risk reduction and energy conservation techniques (handout provided). Pt declining further ambulation distance secondary to fatigue. Will continue to follow acutely.   Follow Up Recommendations  Home health PT;Supervision for mobility/OOB     Equipment Recommendations  None recommended by PT    Recommendations for Other Services       Precautions / Restrictions Precautions Precautions: Fall Restrictions Weight Bearing Restrictions: No    Mobility  Bed Mobility               General bed mobility comments: Received sitting in recliner  Transfers Overall transfer level: Needs assistance Equipment used: None Transfers: Sit to/from Stand;Stand Pivot Transfers Sit to Stand: Supervision Stand pivot transfers: Modified independent (Device/Increase time)       General transfer comment: Mod indep for stand pivot from recliner to [facility] with UE, reliant on UE support on rail. Can stand with no UE support and supervision  Ambulation/Gait Ambulation/Gait assistance: Supervision Gait Distance (Feet): 5 Feet Assistive device: None Gait Pattern/deviations: Step-to pattern;Trunk flexed Gait velocity: Decreased Gait velocity interpretation: <1.31 ft/sec, indicative of household ambulator General Gait Details: Able to stand and take steps to table  then recliner with supervision and intermittent UE support on furniture. DOE 3/4 after minimal activity; SpO2 95% on 3L O2 [place]   Stairs             Wheelchair Mobility    Modified Rankin (Stroke Patients Only)       Balance Overall balance assessment: Needs assistance Sitting-balance support: Feet supported;No upper extremity supported Sitting balance-Leahy Scale: Fair     Standing balance support: During functional activity;Single extremity supported Standing balance-Leahy Scale: Poor Standing balance comment: Can squat/static stand with single UE support to perform pericare at [facility]; reliant on at least single UE support for dynamic stability                            Cognition Arousal/Alertness: Awake/alert Behavior During Therapy: WFL for tasks assessed/performed Overall Cognitive Status: Within Functional Limits for tasks assessed                                        Exercises      General Comments General comments (skin integrity, edema, etc.): Son present during session. Increased time spent discussing energy conservation, handout provided      Pertinent Vitals/Pain Pain Assessment: No/denies pain    Home Living                      Prior Function            PT Goals (current goals can now be found in the care plan section) Acute Rehab PT Goals Patient Stated Goal: to be able  to breathe and get this stuff out of my lungs PT Goal Formulation: With patient Time For Goal Achievement: 11/25/17 Potential to Achieve Goals: Good Progress towards PT goals: Progressing toward goals    Frequency    Min 3X/week      PT Plan Current plan remains appropriate    Co-evaluation              AM-PAC PT "6 Clicks" Daily Activity  Outcome Measure  Difficulty turning over in bed (including adjusting bedclothes, sheets and blankets)?: None Difficulty moving from lying on back to sitting on the side of the bed? : A  Little Difficulty sitting down on and standing up from a chair with arms (e.g., wheelchair, bedside commode, etc,.)?: A Little Help needed moving to and from a bed to chair (including a wheelchair)?: A Little Help needed walking in hospital room?: A Little Help needed climbing 3-5 steps with a railing? : A Lot 6 Click Score: 18    End of Session Equipment Utilized During Treatment: Gait belt;Oxygen Activity Tolerance: Patient limited by fatigue Patient left: in chair;with call bell/phone within reach;with family/visitor present Nurse Communication: Mobility status PT Visit Diagnosis: Unsteadiness on feet (R26.81);Difficulty in walking, not elsewhere classified (R26.2)     Time: 1191-4782 PT Time Calculation (min) (ACUTE ONLY): 17 min  Charges:  $Self Care/Home Management: [place], PT, DPT Acute Rehabilitation Services  Pager [phone] Office [phone]  Derry Lory 11/13/2017, 3:58 PM

## 2017-11-13 NOTE — Plan of Care (Signed)
  Problem: Activity: Goal: Risk for activity intolerance will decrease Outcome: Progressing   Problem: Elimination: Goal: Will not experience complications related to bowel motility Outcome: Progressing   Problem: Pain Managment: Goal: General experience of comfort will improve Outcome: Progressing   Problem: Safety: Goal: Ability to remain free from injury will improve Outcome: Progressing   

## 2017-11-13 NOTE — Consult Note (Addendum)
Cardiology Consultation:   Patient ID: CATALEAH STITES MRN: [id number]; DOB: 03-20-1925  Admit date: 11/10/2017 Date of Consult: 11/13/2017  Primary Care Provider: Leonard Downing, MD Primary Cardiologist: Mertie Moores, MD  Primary Electrophysiologist:  None   Patient Profile:   [name] is a 84 y.o. female with a hx of chronic systolic and diastolic HF, PAF, chronic anticoagulation, h/o NSVT, h/o CVA, COPD on chronic home O2 (2L/min baseline), remote h/o perforated bowel treated with emergency surgery and hypothyroidism. She has been admitted for acute on chronic combined systolic and diastolic CHF and is being seen today for the evaluation of NSVT at the request of Dr. Algis Liming, Internal Medicine.  History of Present Illness:   [name] has a h/o diverticulosis with bout of diverticulitis in 9518 complicated by perforated diverticulum and sepsis and required emergent surgery with colostomy placement. She later underwent reversal of her colostomy.   Her cardiac history includes systolic HF with prior EF of 40-45%. Her most recent echo 11/11/17 shows further reduction down to 30-35% with diffuse hypokinesis, mild MR and mild pulmonary HTN. RV size is normal. In April 2017, she was hospitalized for CVA. Initial 30 day monitor showed no Afib, however she was found to have afib later on that year in Nov 2017 during a hospitalization for COPD. Plavix was discontinued and she was started on Eliquis. She also has a h/o HTN, HLD and hypothyroidism. There is no documentation of coronary disease. She has never had a cardiac cath. She did have a nuclear stress test in 2009 that showed no ischemia but abnormal due to reduced LVF. She also has a prior h/o NSVT. Cardiology was consulted 12/29/2015 for NSVT. Per consult note, "She was noted to have 26 beats of NSVT on telemetry on 12/28 and was asymptomatic with this. Telemetry has shown NSR with frequent PVC's but not further runs of VT". Her  electrolytes were WNL. Troponins were minimally elevated w/ flat trend 0.07>>0.07>>0.05. EF at that time was 45-50%. She was seen by Dr. Tamala Julian at that time and treatment with a  blocker was recommended. Bisoprolol was added due to her pulmonary issues (COPD). She was monitored and did well w/o any recurrent VT. Dr. Tamala Julian recommended no further w/u at that time. Per chart review, there was no immediate post hospital cardiology f/u. She was seen almost a year later in clinic by Dr. Acie Fredrickson 09/26/16 and denied any cardiac symptoms. She has since been followed every 6 months. Last OV 05/20/16 she had had issues with LEE which improved with temporary increase in home lasix, but no other cardiac symptoms. 6 month f/u recommended. Per chart review, it appears her bisoprolol was discontinued in 2018 but reason for discontinuation is unclear. Not outlined in chart.   She presented to the [facility] ED on 11/10/17 with CC of SOB. Pt noted progressive dyspnea over the last few weeks, accompanied by orthopnea, LEE and productive cough with thick mucus. She denied CP and palpitations. She reported compliance with daily home diuretic and admitted that she had not been compliant with low sodium diet.   In the ED, she was found to be in acute CHF. BNP was >1000. CXR also showed pulmonary edema. She was started on IV Lasix and supplemental O2. IM was asked to admit. She has diuresed 2.8L since admit. 2D echo was obtained. Her EF is lower, now at 30-35% (previously 35-40% in May 2019). G1DD also noted. There is diffuse hypokinesis. Her echo in May noted severe  hypokinesis of the inferolateral and inferior myocardium, however this is not reported on most recent echo.   Cardiology consulted for NSVT. K is normal today at 3.9. Mg also normal at 2.2. POC troponin negative x 1.   Past Medical History:  Diagnosis Date  . Anemia    years ago  . Arthritis    "right shoulder" (01/02/2012)  . Bladder mass   . CAP (community acquired  pneumonia) 01/02/2012   Lower lobe   . CHF (congestive heart failure) ([place])   . COPD (chronic obstructive pulmonary disease) ([place])   . Difficulty sleeping   . Diverticulitis of colon with perforation 01/14/2012   That is post sigmoid resection with [facility] pouch and end colostomy   . GERD (gastroesophageal reflux disease)    occasional  . Goiter    radioactive iodine ablation/notes 01/02/2012  . History of kidney stones    X 1  . Hypertension   . Hypothyroidism    "I have taken Synthroid before" (01/02/2012)   HAS HAD RADIOACTIVE IODINE TX 2 YR S AGO  . Intra-abdominal abscess ([place]) 01/14/2012  . PAF (paroxysmal atrial fibrillation) ([place])   . Pneumatosis of intestines 01/14/2012  . Pneumonia 01/02/2012; ? 2009  . Radiation 08/10/13-09/15/13   57 gray to vaginal/bladder mass  . SOB (shortness of breath)    'all the time" (01/02/2012)  . Stroke ([place])   . UTI (lower urinary tract infection) 01/03/2012    Past Surgical History:  Procedure Laterality Date  . ABDOMINAL HYSTERECTOMY  1980's  . APPENDECTOMY  1980's   "when they did hysterectomy" (01/02/2012)  . CATARACT EXTRACTION W/ INTRAOCULAR LENS  IMPLANT, BILATERAL  ?1990's   Bil  . COLON SURGERY    . COLOSTOMY N/A 03/12/2012   Procedure: COLOSTOMY;  Surgeon: Ralene Ok, MD;  Location: [place];  Service: General;  Laterality: N/A;  . COLOSTOMY REVISION N/A 03/12/2012   Procedure: COLON RESECTION SIGMOID ;  Surgeon: Ralene Ok, MD;  Location: [place];  Service: General;  Laterality: N/A;  . COLOSTOMY TAKEDOWN N/A 10/21/2012   Procedure: LAPAROSCOPIC COLOSTOMY TAKEDOWN AND HARTMANS ANASTOMOSIS, rigid proctoscopy;  Surgeon: Ralene Ok, MD;  Location: [facility];  Service: General;  Laterality: N/A;  . EYE SURGERY    . LYSIS OF ADHESION N/A 10/21/2012   Procedure: LYSIS OF ADHESION;  Surgeon: Ralene Ok, MD;  Location: [facility];  Service: General;  Laterality: N/A;  . TRANSURETHRAL RESECTION OF BLADDER TUMOR WITH GYRUS (TURBT-GYRUS) N/A  06/30/2013   Procedure: TRANSURETHRAL RESECTION OF BLADDER TUMOR WITH GYRUS (TURBT-GYRUS)/VAGINAL BIOPSY;  Surgeon: Ardis Hughs, MD;  Location: [facility];  Service: Urology;  Laterality: N/A;     Home Medications:  Prior to Admission medications   Medication Sig Start Date End Date Taking? Authorizing Provider  acetaminophen (TYLENOL) 500 MG tablet Take 1,000 mg by mouth every 6 (six) hours as needed for mild pain. Reported on 07/14/2015   Yes [provider]  albuterol (PROVENTIL HFA;VENTOLIN HFA) 108 (90 BASE) MCG/ACT inhaler Inhale 2 puffs into the lungs every 6 (six) hours as needed for wheezing.   Yes [provider]  albuterol (PROVENTIL) (2.5 MG/3ML) 0.083% nebulizer solution Take 3 mLs (2.5 mg total) by nebulization every 6 (six) hours as needed for wheezing or shortness of breath. 12/17/15  Yes Elgergawy, Silver Huguenin, MD  apixaban (ELIQUIS) 5 MG TABS tablet Take 1 tablet (5 mg total) by mouth 2 (two) times daily. 08/28/15  Yes Ghimire, Henreitta Leber, MD  cetirizine (ZYRTEC) 10  MG tablet Take 10 mg by mouth daily as needed for allergies.    Yes [provider]  cholecalciferol (VITAMIN D) 1000 units tablet Take 1,000 Units by mouth daily.   Yes [provider]  diphenhydramine-acetaminophen (TYLENOL PM) 25-500 MG TABS tablet Take 1 tablet by mouth at bedtime as needed (sleep).    Yes [provider]  docusate sodium (COLACE) 100 MG capsule Take 1 capsule (100 mg total) by mouth 2 (two) times daily. Patient taking differently: Take 100 mg by mouth 2 (two) times daily as needed for mild constipation.  05/01/17  Yes Oretha Milch D, MD  ferrous sulfate 325 (65 FE) MG tablet Take 325 mg by mouth 2 (two) times daily with a meal.    Yes [provider]  fluticasone (FLONASE) 50 MCG/ACT nasal spray Place 1 spray into both nostrils daily as needed for allergies.  07/19/17  Yes [provider]  furosemide (LASIX) 40 MG tablet Take 1 tablet (40  mg total) by mouth daily. 12/18/15  Yes Elgergawy, Silver Huguenin, MD  pantoprazole (PROTONIX) 40 MG tablet Take 1 tablet (40 mg total) by mouth daily. 08/14/17  Yes Fenton Foy, NP  polyethylene glycol (MIRALAX / GLYCOLAX) packet Take 17 g by mouth daily as needed for mild constipation.    Yes [provider]  polyvinyl alcohol (LIQUIFILM TEARS) 1.4 % ophthalmic solution Place 1 drop into both eyes as needed for dry eyes.   Yes [provider]    Inpatient Medications: Scheduled Meds: . apixaban  5 mg Oral BID  . cholecalciferol  1,000 Units Oral Daily  . ferrous sulfate  325 mg Oral BID WC  . furosemide  40 mg Intravenous Q12H  . loratadine  10 mg Oral Daily  . mometasone-formoterol  2 puff Inhalation BID  . pantoprazole  40 mg Oral Daily  . sodium chloride flush  3 mL Intravenous Q12H   Continuous Infusions: . sodium chloride     PRN Meds: sodium chloride, acetaminophen, albuterol, docusate sodium, ondansetron (ZOFRAN) IV, polyethylene glycol, sodium chloride flush  Allergies:    Allergies  Allergen Reactions  . Indomethacin Anaphylaxis and Other (See Comments)    "took it fine for awhile; one day I stopped breathing and I ended up in the hospital" (01/02/2012) Pt has tolerated aspirin    Social History:   Social History   Socioeconomic History  . Marital status: Widowed    Spouse name: Not on file  . Number of children: 6  . Years of education: Not on file  . Highest education level: Not on file  Occupational History  . Occupation: retired  Scientific laboratory technician  . Financial resource strain: Not on file  . Food insecurity:    Worry: Not on file    Inability: Not on file  . Transportation needs:    Medical: Not on file    Non-medical: Not on file  Tobacco Use  . Smoking status: Former Smoker    Packs/day: 0.12    Years: 35.00    Pack years: 4.20    Types: Cigarettes    Last attempt to quit: 01/02/1991    Years since quitting: 26.8  . Smokeless tobacco:  Never Used  Substance and Sexual Activity  . Alcohol use: No  . Drug use: No  . Sexual activity: Never  Lifestyle  . Physical activity:    Days per week: Not on file    Minutes per session: Not on file  . Stress: Not  on file  Relationships  . Social connections:    Talks on phone: Not on file    Gets together: Not on file    Attends religious service: Not on file    Active member of club or organization: Not on file    Attends meetings of clubs or organizations: Not on file    Relationship status: Not on file  . Intimate partner violence:    Fear of current or ex partner: Not on file    Emotionally abused: Not on file    Physically abused: Not on file    Forced sexual activity: Not on file  Other Topics Concern  . Not on file  Social History Narrative  . Not on file    Family History:    Family History  Problem Relation Age of Onset  . Cancer Sister        Breast  . Stroke Sister   . Heart disease Mother   . Heart disease Unknown      ROS:  Please see the history of present illness.   All other ROS reviewed and negative.     Physical Exam/Data:   Vitals:   11/12/17 1959 11/13/17 0341 11/13/17 0824 11/13/17 1227  BP:  132/68  110/75  Pulse:  62  69  Resp:  18  20  Temp:  (!) 97.4 F (36.3 C)  98.3 F (36.8 C)  TempSrc:  Oral  Oral  SpO2: 98% 98% 96% 100%  Weight:  67.7 kg    Height:        Intake/Output Summary (Last 24 hours) at 11/13/2017 1557 Last data filed at 11/13/2017 1412 Gross per 24 hour  Intake 960 ml  Output 1500 ml  Net -540 ml   Filed Weights   11/11/17 0506 11/12/17 0532 11/13/17 0341  Weight: 69.4 kg 70.3 kg 67.7 kg   Body mass index is 29.14 kg/m.  General:  Elderly BF, Well nourished, well developed, in no acute distress HEENT: normal Lymph: no adenopathy Neck: no JVD Endocrine:  No thryomegaly Vascular: No carotid bruits; FA pulses 2+ bilaterally without bruits  Cardiac:  normal S1, S2; RRR; no murmur  Lungs:  clear to  auscultation bilaterally, with faint crackles at the RLL Abd: soft, nontender, no hepatomegaly  Ext: no edema Musculoskeletal:  No deformities, BUE and BLE strength normal and equal Skin: warm and dry  Neuro:  CNs 2-12 intact, no focal abnormalities noted Psych:  Normal affect   EKG:  The EKG was personally reviewed and demonstrates:  SR w/ PVCs Telemetry:  Telemetry was personally reviewed and demonstrates:  NSR mid 60s, 8 beat run of NSVT x 1.   Relevant CV Studies: 2D Echo 11/11/17 Study Conclusions  - Left ventricle: The cavity size was normal. Wall thickness was   normal. Systolic function was moderately to severely reduced. The   estimated ejection fraction was in the range of 30% to 35%.   Diffuse hypokinesis. Doppler parameters are consistent with   abnormal left ventricular relaxation (grade 1 diastolic   dysfunction). - Aortic valve: There was no stenosis. - Mitral valve: Moderately calcified annulus. There was mild   regurgitation. - Left atrium: The atrium was severely dilated. - Right ventricle: The cavity size was normal. Systolic function   was normal. - Right atrium: The atrium was mildly dilated. - Tricuspid valve: Peak RV-RA gradient (S): 32 mm Hg. - Pulmonary arteries: PA peak pressure: 40 mm Hg (S). - Systemic veins: IVC measured 2.7  cm with > 50% respirophasic   variation, suggesting RA pressure 8 mmHg.  Impressions:  - Normal LV size with EF 30-35%, diffuse hypokinesis. Normal RV   size and systolic function. Mild pulmonary hypertension. Biatrial   enlargement. Mild mitral regurgitation.  Laboratory Data:  Chemistry Recent Labs  Lab 11/11/17 0716 11/11/17 1700 11/12/17 0524 11/13/17 0403  NA 140  --  137 138  K 4.0 4.5 4.2 3.9  CL 99  --  94* 94*  CO2 36*  --  37* 37*  GLUCOSE 109*  --  113* 108*  BUN 18  --  18 20  CREATININE 1.03*  --  1.19* 1.12*  CALCIUM 8.6*  --  8.7* 9.0  GFRNONAA 46*  --  39* 42*  GFRAA 54*  --  45* 49*    ANIONGAP 5  --  6 7    No results for input(s): PROT, ALBUMIN, AST, ALT, ALKPHOS, BILITOT in the last 168 hours. Hematology Recent Labs  Lab 11/10/17 0500 11/12/17 0524  WBC 5.0 4.5  RBC 3.93 2.83*  HGB 11.5* 10.9*  HCT 39.1 29.2*  MCV 99.5 103.2*  MCH 29.3 38.5*  MCHC 29.4* 29.5*  RDW 11.6 13.1  PLT 214 184   Cardiac EnzymesNo results for input(s): TROPONINI in the last 168 hours.  Recent Labs  Lab 11/10/17 0507  TROPIPOC 0.08    BNP Recent Labs  Lab 11/10/17 0500  BNP 1,411.4*    DDimer No results for input(s): DDIMER in the last 168 hours.  Radiology/Studies:  Dg Chest 2 View  Result Date: 11/10/2017 CLINICAL DATA:  Acute onset of nasal and head congestion. Shortness of breath. EXAM: CHEST - 2 VIEW COMPARISON:  Chest radiograph performed 08/14/2017 FINDINGS: The lungs are well-aerated. Vascular congestion is noted. Increased interstitial markings raise concern for mild interstitial edema. There is no evidence of pleural effusion or pneumothorax. The heart is mildly enlarged. No acute osseous abnormalities are seen. Dense calcifications are noted at the enlarged thyroid gland. Degenerative change is noted at the right glenohumeral joint. IMPRESSION: Vascular congestion and mild cardiomegaly. Increased interstitial markings raise concern for mild interstitial edema. Electronically Signed   By: Garald Balding M.D.   On: 11/10/2017 06:15    Assessment and Plan:   [name] is a 84 y.o. female with a hx of chronic systolic and diastolic HF, PAF, chronic anticoagulation, h/o NSVT, h/o CVA, COPD on chronic home O2 (2L/min baseline), remote h/o perforated bowel treated with emergency surgery and hypothyroidism. She has been admitted for acute on chronic combined systolic and diastolic CHF and is being seen today for the evaluation of NSVT at the request of Dr. Algis Liming, Internal Medicine.  1. NSVT: in the setting of cardiomyopathy, with EF of 30-35%. She has had systolic HF  for many years. She had a NST in 2009 that showed reduced EF but normal perfusion. No h/o cardiac cath. She had an echo in May of this year which showed EF at 35-40% and severe hypokinesis of the inferolateral and inferior myocardium but no ischemic w/u pursued. She was noted to have a 26 beat run of NSVT during a hospitalization for COPD exacerbation in 2017. She was seen by Dr. Tamala Julian who recommended treatment with a  blocker . Bisoprolol was initiated, she was monitored and did well w/o any recurrence on tele. Per chart review, it appears her bisoprolol was discontinued in 2018 but reason for discontinuation is unclear. Not outlined in chart.  She has now been  admitted for a/c combined systolic and diastolic HF and noted to have NSVT on tele. She had a 8 beat run x 1. K and Mg are both WNL at 3.9 and 2.2 respectively. Given her further decline in LVF and inferolateral and inferior WMA noted on prior echo + her other cardiac risk factors including HTN and HLD, it is possible that she may have underlying coronary artery disease/ ischemia as potential etiology for her cardiomyopathy and NSVT. However given her advanced age and lack of CP, I would not recommend aggressive work-up/ ischemic evaluation. Given her advanced age, low resting HR and fact that she was asymptomatic w/ her NSVT, would not recommend any treatment at this time. Will avoid restarting  blocker due to low resting HR and pulmonary disease. Continue to monitor.   2. Acute on Chronic Combined Systolic and Diastolic CHF: volume status and symptoms improved with IV Lasix. Plan to transition back to PO once euvolemic. Monitor renal function. Low sodium diet. SBP in the 110s, thus little room to add an ACE/ARB, especially if addition of low dose  blocker is to be considered.   3. PAF: currently NSR with PVCs and one 8 beat run of NSVT. Continue Eliquis due to elevated CHA2DS2 VASc score and prior h/o CVA. Currently not on anything for rate  control, but given NSVT would recommend retrial of  blocker w/ close monitoring of respiratory function given underlying COPD. Consider restarting bisoprolol vs metoprolol.   4.COPD: management per IM.   For questions or updates, please contact [place] Please consult [email or web address] for contact info under   Signed, Lyda Jester, PA-C  11/13/2017 3:57 PM  The patient was seen, examined and discussed with Brittainy M. Rosita Fire, PA-C and I agree with the above.   84 y.o. female with a hx of chronic systolic and diastolic HF (38-25%), PAF, chronic anticoagulation, h/o NSVT, h/o CVA, COPD on chronic home O2 (2L/min baseline), remote h/o perforated bowel treated with emergency surgery and hypothyroidism. She has been admitted for acute on chronic combined systolic and diastolic CHF and is being seen today for the evaluation of NSVT.  Telemetry shows one episode also nonsustained VT for total of 8 beats, patient was completely asymptomatic during that presentation. Physical exam the patient still has crackles at both bases, she states that she still cannot lay flat and has not been walking yet.  I would continue IV Lasix and increase it to every 6 hours till tomorrow, reevaluate in the morning and most probably discharge tomorrow.  Physical therapy should be involved and patient needs to be evaluated prior to discharge.  Ena Dawley, MD 11/13/2017

## 2017-11-13 NOTE — Care Management Important Message (Signed)
Important Message  Patient Details  Name: [name] MRN: [id number] Date of Birth: [birth year]   Medicare Important Message Given:  Yes    Chelcey Caputo P Jos Cygan 11/13/2017, 4:12 PM

## 2017-11-13 NOTE — Progress Notes (Signed)
Patient ID: [name], female   DOB: 14-Nov-1925, 84 y.o.   MRN: [id number]  PROGRESS NOTE    JULINA ALTMANN  TZG:[id number] DOB: 1925/05/14 DOA: 11/10/2017 PCP: Leonard Downing, MD   Brief Narrative:  84 year old female with history of stroke, paroxysmal A. fib, hypothyroidism, hypertension, GERD, COPD, chronic hypoxic respite failure on home oxygen, chronic systolic CHF with EF 35 to 40%, bladder mass status post surgery, anemia presented on 11/10/2017 with shortness of breath.  BNP was more than 1000.  She was started on IV Lasix and admitted for CHF exacerbation   Assessment & Plan:   Active Problems:   COPD (chronic obstructive pulmonary disease) (HCC)   Hypertension   Asymptomatic PVCs   PAF (paroxysmal atrial fibrillation) (HCC)   Acute on chronic systolic (congestive) heart failure (HCC)  Acute on chronic systolic and diastolic congestive heart failure -2D echo shows EF of 30 to 35% with diffuse hypokinesis and grade 1 diastolic dysfunction -Fluid restriction.  Monitor creatinine -She was treated with IV Lasix.  -2.8 L since admission. Cardiology was consulted and recommend continuing IV Lasix for another day, reassess in a.m. and possibly discharge on oral Lasix.  NSVT Has prior history of same.  Reportedly was on bisoprolol which was discontinued in 2018 for unclear reasons as per cardiology.  Cardiology was consulted and recommend no further evaluation at this time.  Also given her advanced age, low resting heart rate and that she was asymptomatic with her NSVT, they do not recommend any treatment at this time either.  Hence avoiding restarting beta-blockers.  COPD with chronic hypoxic respiratory failure -Not currently wheezing.  Continue PRN nebs.  Continue Dulera.  Continue oxygen supplementation.  Stable.  Hypertension -Monitor blood pressure.  Continue Lasix.  Controlled.  Paroxysmal atrial fibrillation -Rate controlled.  Continue Eliquis.  Not on any rate  controlling medications  Goiter -Monitor.  This is also probably making her breathing difficult at baseline and given her age and debility, she is not a surgical candidate per chart review.  Outpatient follow-up.  Generalized deconditioning -PT eval and recommended home health PT.  Macrocytic anemia Stable.  Periodically follow CBCs.  DVT prophylaxis: Eliquis Code Status: DNR Family Communication: Discussed in detail with patient's son and daughter at bedside. Disposition Plan: Possible discharge home pending clinical improvement on 11/14.  Consultants: Cardiology.  Procedures:  Echo on 11/11/2017 Study Conclusions  - Left ventricle: The cavity size was normal. Wall thickness was   normal. Systolic function was moderately to severely reduced. The   estimated ejection fraction was in the range of 30% to 35%.   Diffuse hypokinesis. Doppler parameters are consistent with   abnormal left ventricular relaxation (grade 1 diastolic   dysfunction). - Aortic valve: There was no stenosis. - Mitral valve: Moderately calcified annulus. There was mild   regurgitation. - Left atrium: The atrium was severely dilated. - Right ventricle: The cavity size was normal. Systolic function   was normal. - Right atrium: The atrium was mildly dilated. - Tricuspid valve: Peak RV-RA gradient (S): 32 mm Hg. - Pulmonary arteries: PA peak pressure: 40 mm Hg (S). - Systemic veins: IVC measured 2.7 cm with > 50% respirophasic   variation, suggesting RA pressure 8 mmHg.  Impressions:  - Normal LV size with EF 30-35%, diffuse hypokinesis. Normal RV   size and systolic function. Mild pulmonary hypertension. Biatrial   enlargement. Mild mitral regurgitation.  Antimicrobials: None   Subjective: Dyspnea improved.  Breathing not yet at baseline.  No chest pain.  No difficulty or pain on swallowing or noisy breathing.  Objective: Vitals:   11/12/17 1959 11/13/17 0341 11/13/17 0824 11/13/17 1227  BP:   132/68  110/75  Pulse:  62  69  Resp:  18  20  Temp:  (!) 97.4 F (36.3 C)  98.3 F (36.8 C)  TempSrc:  Oral  Oral  SpO2: 98% 98% 96% 100%  Weight:  67.7 kg    Height:        Intake/Output Summary (Last 24 hours) at 11/13/2017 1819 Last data filed at 11/13/2017 1412 Gross per 24 hour  Intake 960 ml  Output 1500 ml  Net -540 ml   Filed Weights   11/11/17 0506 11/12/17 0532 11/13/17 0341  Weight: 69.4 kg 70.3 kg 67.7 kg    Examination:  General exam: Pleasant elderly female, moderately built and obese sitting up comfortably in chair. Respiratory system: Occasional basal crackles.  Otherwise clear to auscultation.  No increased work of breathing. Cardiovascular system: S1 and S2 heard, RRR.  No JVD or murmurs.  Trace bilateral ankle edema.  Telemetry personally reviewed: Sinus rhythm.  Noted approximately 26 beat nonsustained VT on 11/11/2017 at 1:48 PM. Gastrointestinal system: Abdomen is nondistended, soft and nontender. Normal bowel sounds heard.  Stable. Extremities: No cyanosis; trace edema Neck: Goiter, predominantly on right side of neck.  No bruit.  No acute findings. CNS: Alert and oriented.  No focal neurological deficits.   Data Reviewed: I have personally reviewed following labs and imaging studies  CBC: Recent Labs  Lab 11/10/17 0500 11/12/17 0524  WBC 5.0 4.5  NEUTROABS 3.2 3.0  HGB 11.5* 10.9*  HCT 39.1 29.2*  MCV 99.5 103.2*  PLT 214 093   Basic Metabolic Panel: Recent Labs  Lab 11/10/17 0500 11/11/17 0716 11/11/17 1700 11/12/17 0524 11/13/17 0403  NA 142 140  --  137 138  K 3.7 4.0 4.5 4.2 3.9  CL 102 99  --  94* 94*  CO2 33* 36*  --  37* 37*  GLUCOSE 106* 109*  --  113* 108*  BUN 18 18  --  18 20  CREATININE 1.01* 1.03*  --  1.19* 1.12*  CALCIUM 9.0 8.6*  --  8.7* 9.0  MG  --   --  2.0 2.2 2.2   GFR: Estimated Creatinine Clearance: 28.7 mL/min (A) (by C-G formula based on SCr of 1.12 mg/dL (H)).  Radiology Studies: No results  found.      Scheduled Meds: . apixaban  5 mg Oral BID  . cholecalciferol  1,000 Units Oral Daily  . ferrous sulfate  325 mg Oral BID WC  . furosemide  40 mg Intravenous Q12H  . loratadine  10 mg Oral Daily  . mometasone-formoterol  2 puff Inhalation BID  . pantoprazole  40 mg Oral Daily  . sodium chloride flush  3 mL Intravenous Q12H   Continuous Infusions: . sodium chloride       LOS: 3 days    Vernell Leep, MD, FACP, [facility]. [facility] Pager [phone]  If 7PM-7AM, please contact night-coverage [email or web address] Password [email or web address] 11/13/2017, 6:30 PM

## 2017-11-14 DIAGNOSIS — I493 Ventricular premature depolarization: Secondary | ICD-10-CM

## 2017-11-14 DIAGNOSIS — I5043 Acute on chronic combined systolic (congestive) and diastolic (congestive) heart failure: Secondary | ICD-10-CM

## 2017-11-14 DIAGNOSIS — I10 Essential (primary) hypertension: Secondary | ICD-10-CM

## 2017-11-14 LAB — BASIC METABOLIC PANEL
ANION GAP: 9 (ref 5–15)
BUN: 25 mg/dL — AB (ref 8–23)
CHLORIDE: 93 mmol/L — AB (ref 98–111)
CO2: 36 mmol/L — AB (ref 22–32)
Calcium: 8.8 mg/dL — ABNORMAL LOW (ref 8.9–10.3)
Creatinine, Ser: 1.24 mg/dL — ABNORMAL HIGH (ref 0.44–1.00)
GFR calc Af Amer: 43 mL/min — ABNORMAL LOW (ref >60)
GFR, EST NON AFRICAN AMERICAN: 37 mL/min — AB (ref >60)
GLUCOSE: 110 mg/dL — AB (ref 70–99)
POTASSIUM: 3.8 mmol/L (ref 3.5–5.1)
Sodium: 138 mmol/L (ref 135–145)

## 2017-11-14 LAB — CBC
HEMATOCRIT: 37.5 % (ref 36.0–46.0)
HEMOGLOBIN: 11.6 g/dL — AB (ref 12.0–15.0)
MCH: 30.2 pg (ref 26.0–34.0)
MCHC: 30.9 g/dL (ref 30.0–36.0)
MCV: 97.7 fL (ref 80.0–100.0)
Platelets: 202 10*3/uL (ref 150–400)
RBC: 3.84 MIL/uL — AB (ref 3.87–5.11)
RDW: 12.1 % (ref 11.5–15.5)
WBC: 4.5 10*3/uL (ref 4.0–10.5)
nRBC: 0 % (ref 0.0–0.2)

## 2017-11-14 NOTE — Plan of Care (Signed)
  Problem: Activity: Goal: Risk for activity intolerance will decrease Outcome: Progressing   Problem: Elimination: Goal: Will not experience complications related to bowel motility Outcome: Progressing   Problem: Safety: Goal: Ability to remain free from injury will improve Outcome: Progressing   Problem: Activity: Goal: Capacity to carry out activities will improve Outcome: Progressing   

## 2017-11-14 NOTE — Plan of Care (Signed)
  Problem: Activity: Goal: Risk for activity intolerance will decrease Outcome: Progressing   Problem: Elimination: Goal: Will not experience complications related to bowel motility Outcome: Progressing   Problem: Pain Managment: Goal: General experience of comfort will improve Outcome: Progressing   Problem: Activity: Goal: Capacity to carry out activities will improve Outcome: Progressing   Problem: Skin Integrity: Goal: Risk for impaired skin integrity will decrease Outcome: Completed/Met

## 2017-11-14 NOTE — Progress Notes (Signed)
Progress Note  Patient Name: [name] Date of Encounter: 11/14/2017  Primary Cardiologist: Mertie Moores, MD   Subjective   She feels better today, nit yet at baseline.   Inpatient Medications    Scheduled Meds: . apixaban  5 mg Oral BID  . cholecalciferol  1,000 Units Oral Daily  . ferrous sulfate  325 mg Oral BID WC  . furosemide  40 mg Intravenous Q12H  . loratadine  10 mg Oral Daily  . mometasone-formoterol  2 puff Inhalation BID  . pantoprazole  40 mg Oral Daily  . sodium chloride flush  3 mL Intravenous Q12H   Continuous Infusions: . sodium chloride     PRN Meds: sodium chloride, acetaminophen, albuterol, docusate sodium, ondansetron (ZOFRAN) IV, polyethylene glycol, sodium chloride flush   Vital Signs    Vitals:   11/14/17 0432 11/14/17 0728 11/14/17 0801 11/14/17 0817  BP: (!) 143/74 138/72 128/63   Pulse: 72 63 62   Resp: 20 (!) 21 20   Temp: (!) 97.4 F (36.3 C) 97.7 F (36.5 C) 98.5 F (36.9 C)   TempSrc: Oral Oral Oral   SpO2: 99% 100% 100% 100%  Weight: 66.2 kg     Height:        Intake/Output Summary (Last 24 hours) at 11/14/2017 1233 Last data filed at 11/14/2017 1109 Gross per 24 hour  Intake 870 ml  Output 2000 ml  Net -1130 ml   Filed Weights   11/12/17 0532 11/13/17 0341 11/14/17 0432  Weight: 70.3 kg 67.7 kg 66.2 kg   Telemetry    SR, 2 episodes of nsVT - 8 and 6 beats - Personally Reviewed  Physical Exam   GEN: No acute distress.   Neck: No JVD Cardiac: RRR, no murmurs, rubs, or gallops.  Respiratory: Clear to auscultation bilaterally. GI: Soft, nontender, non-distended  MS: No edema; No deformity. Neuro:  Nonfocal  Psych: Normal affect   Labs    Chemistry Recent Labs  Lab 11/12/17 0524 11/13/17 0403 11/14/17 0242  NA 137 138 138  K 4.2 3.9 3.8  CL 94* 94* 93*  CO2 37* 37* 36*  GLUCOSE 113* 108* 110*  BUN 18 20 25*  CREATININE 1.19* 1.12* 1.24*  CALCIUM 8.7* 9.0 8.8*  GFRNONAA 39* 42* 37*  GFRAA 45*  49* 43*  ANIONGAP 6 7 9      Hematology Recent Labs  Lab 11/10/17 0500 11/12/17 0524 11/14/17 0242  WBC 5.0 4.5 4.5  RBC 3.93 2.83* 3.84*  HGB 11.5* 10.9* 11.6*  HCT 39.1 29.2* 37.5  MCV 99.5 103.2* 97.7  MCH 29.3 38.5* 30.2  MCHC 29.4* 29.5* 30.9  RDW 11.6 13.1 12.1  PLT 214 184 202    Cardiac EnzymesNo results for input(s): TROPONINI in the last 168 hours.  Recent Labs  Lab 11/10/17 0507  TROPIPOC 0.08     BNP Recent Labs  Lab 11/10/17 0500  BNP 1,411.4*     DDimer No results for input(s): DDIMER in the last 168 hours.   Radiology    No results found.  Cardiac Studies     Patient Profile     84 y.o. female   [place]   1. NSVT:  2. Acute on Chronic Combined Systolic and Diastolic CHF:  3. PAF:   84 y.o.femalewith a hx of chronic systolic and diastolic HF (46-80%), PAF, chronic anticoagulation, h/o NSVT, h/o CVA,COPD on chronic home O2 (2L/min baseline),remote h/o perforated bowel treated with emergency surgery and hypothyroidism. She has  been admitted for acute on chronic combined systolic and diastolic CHF and isbeing seen today for the evaluation of NSVT.  Telemetry shows two episode also nonsustained VT for total of 8 and 6 beats, patient was completely asymptomatic during that presentation. She feels better with diuresis overnight (neg 1.3 liters and down 2 kg) but not yet back to baseline.  We will continue iv diuresis till tomorrow then switch to lasix 40 mg PO BID (previously daily). Crea 1.1->1.2, we will follow, continue physical therapy. Anticipated discharge tomorrow.   For questions or updates, please contact [place] Please consult [email or web address] for contact info under      Signed, Ena Dawley, MD  11/14/2017, 12:33 PM

## 2017-11-14 NOTE — Progress Notes (Signed)
Physical Therapy Treatment Patient Details Name: [name] MRN: [id number] DOB: Jul 29, 1925 Today's Date: 11/14/2017    History of Present Illness Patient is a 84 y/o female admitted 11/10/17 with SOB and swelling in BLEs. CXR showed pulmonary vascular congestion with mild pulmonary edema. Worked up for CHF. PMH includes HTN, paroxysmal A-fib, CHF, CVA, COPD.   PT Comments    Pt progressing with mobility. Ambulatory with RW at supervision-level; requires standing rest break secondary to DOE 3/4, SpO2 94% on 2L O2 [name]. Reviewed energy conservation strategies. Will plan for stair training next session.    Follow Up Recommendations  Home health PT;Supervision for mobility/OOB     Equipment Recommendations  None recommended by PT    Recommendations for Other Services       Precautions / Restrictions Precautions Precautions: Fall Restrictions Weight Bearing Restrictions: No    Mobility  Bed Mobility               General bed mobility comments: Received sitting in recliner  Transfers Overall transfer level: Modified independent Equipment used: Rolling walker (2 wheeled) Transfers: Sit to/from Stand Sit to Stand: Modified independent (Device/Increase time)            Ambulation/Gait Ambulation/Gait assistance: Supervision Gait Distance (Feet): 120 Feet Assistive device: Rolling walker (2 wheeled) Gait Pattern/deviations: Step-through pattern;Decreased stride length;Trunk flexed Gait velocity: Decreased Gait velocity interpretation: 1.31 - 2.62 ft/sec, indicative of limited community ambulator General Gait Details: Supervision to amb with RW, only requiring assist for portable O2 tank; 1x prolonged standing rest break leaning over RW secondary to DOE 3/4. SpO2 94% on 2L O2 [name]   Stairs             Wheelchair Mobility    Modified Rankin (Stroke Patients Only)       Balance Overall balance assessment: Needs assistance Sitting-balance support: Feet  supported;No upper extremity supported Sitting balance-Leahy Scale: Fair     Standing balance support: During functional activity;Single extremity supported Standing balance-Leahy Scale: Fair Standing balance comment: Can static stand without UE support; dynamic stability much improved with at least single UE support                            Cognition Arousal/Alertness: Awake/alert Behavior During Therapy: WFL for tasks assessed/performed Overall Cognitive Status: Within Functional Limits for tasks assessed                                        Exercises      General Comments        Pertinent Vitals/Pain Pain Assessment: No/denies pain    Home Living                      Prior Function            PT Goals (current goals can now be found in the care plan section) Acute Rehab PT Goals Patient Stated Goal: "Hopefully they'll send me home tomorrow" PT Goal Formulation: With patient Time For Goal Achievement: 11/25/17 Potential to Achieve Goals: Good Progress towards PT goals: Progressing toward goals    Frequency    Min 3X/week      PT Plan Current plan remains appropriate    Co-evaluation              AM-PAC PT "6 Clicks" Daily Activity  Outcome Measure  Difficulty turning over in bed (including adjusting bedclothes, sheets and blankets)?: None Difficulty moving from lying on back to sitting on the side of the bed? : None Difficulty sitting down on and standing up from a chair with arms (e.g., wheelchair, bedside commode, etc,.)?: None Help needed moving to and from a bed to chair (including a wheelchair)?: None Help needed walking in hospital room?: A Little Help needed climbing 3-5 steps with a railing? : A Lot 6 Click Score: 21    End of Session Equipment Utilized During Treatment: Gait belt;Oxygen Activity Tolerance: Patient limited by fatigue Patient left: in chair;with call bell/phone within reach Nurse  Communication: Mobility status PT Visit Diagnosis: Unsteadiness on feet (R26.81);Difficulty in walking, not elsewhere classified (R26.2)     Time: 2956-2130 PT Time Calculation (min) (ACUTE ONLY): 15 min  Charges:  $Gait Training: 8-22 mins                    Mabeline Caras, PT, DPT Acute Rehabilitation Services  Pager [phone] Office [place] 11/14/2017, 4:32 PM

## 2017-11-14 NOTE — Progress Notes (Signed)
Patient ID: [name], female   DOB: 1925/02/04, 84 y.o.   MRN: [id number]  PROGRESS NOTE    [name]  [id number]:[id number] DOB: 27-Nov-1925 DOA: 11/10/2017 PCP: Leonard Downing, MD   Brief Narrative:  84 year old female with history of stroke, paroxysmal A. fib, hypothyroidism, hypertension, GERD, COPD, chronic hypoxic respite failure on home oxygen, chronic systolic CHF with EF 35 to 40%, bladder mass status post surgery, anemia presented on 11/10/2017 with shortness of breath.  BNP was more than 1000.  She was started on IV Lasix and admitted for CHF exacerbation.  11/14/2017: Seen alongside patient's daughter and son.  Discussed extensively with patient and family.  Also discussed with cardiology team.  Cardiology is managing patient diuretics.  Hopefully, patient will be discharged back home in the next 1 to 2 days.  Patient continues to improve.  Seems eager to be discharged back on.  Discussed need to prevent early to a provider when a deterioration is noted.  Patient voiced understanding.   Assessment & Plan:   Active Problems:   COPD (chronic obstructive pulmonary disease) (HCC)   Hypertension   Asymptomatic PVCs   PAF (paroxysmal atrial fibrillation) (HCC)   Acute on chronic systolic (congestive) heart failure (HCC)  Acute on chronic systolic and diastolic congestive heart failure -2D echo shows EF of 30 to 35% with diffuse hypokinesis and grade 1 diastolic dysfunction -Fluid restriction.  Monitor creatinine -She was treated with IV Lasix.  -2.8 L since admission. Cardiology was consulted and recommend continuing IV Lasix for another day, reassess in a.m. and possibly discharge on oral Lasix. -11/14/2017: Continue IV Lasix for today as per cardiology team.  Likely transition to oral diuretics in a.m.  NSVT Has prior history of same.  Reportedly was on bisoprolol which was discontinued in 2018 for unclear reasons as per cardiology.  Cardiology was consulted and recommend no  further evaluation at this time.  Also given her advanced age, low resting heart rate and that she was asymptomatic with her NSVT, they do not recommend any treatment at this time either.  Hence avoiding restarting beta-blockers.  COPD with chronic hypoxic respiratory failure -Not currently wheezing.  Continue PRN nebs.  Continue Dulera.  Continue oxygen supplementation.  Stable.  Hypertension -Monitor blood pressure.  Continue Lasix.  Controlled.  Paroxysmal atrial fibrillation -Rate controlled.  Continue Eliquis.  Not on any rate controlling medications  Goiter -Monitor.  This is also probably making her breathing difficult at baseline and given her age and debility, she is not a surgical candidate per chart review.  Outpatient follow-up.  Generalized deconditioning -PT eval and recommended home health PT.  Macrocytic anemia Stable.  Periodically follow CBCs.  DVT prophylaxis: Eliquis Code Status: DNR Family Communication: Discussed in detail with patient's son and daughter at bedside. Disposition Plan: Possible discharge home pending clinical improvement on 11/14.  Consultants: Cardiology.  Procedures:  Echo on 11/11/2017 Study Conclusions  - Left ventricle: The cavity size was normal. Wall thickness was   normal. Systolic function was moderately to severely reduced. The   estimated ejection fraction was in the range of 30% to 35%.   Diffuse hypokinesis. Doppler parameters are consistent with   abnormal left ventricular relaxation (grade 1 diastolic   dysfunction). - Aortic valve: There was no stenosis. - Mitral valve: Moderately calcified annulus. There was mild   regurgitation. - Left atrium: The atrium was severely dilated. - Right ventricle: The cavity size was normal. Systolic function   was normal. -  Right atrium: The atrium was mildly dilated. - Tricuspid valve: Peak RV-RA gradient (S): 32 mm Hg. - Pulmonary arteries: PA peak pressure: 40 mm Hg (S). - Systemic  veins: IVC measured 2.7 cm with > 50% respirophasic   variation, suggesting RA pressure 8 mmHg.  Impressions:  - Normal LV size with EF 30-35%, diffuse hypokinesis. Normal RV   size and systolic function. Mild pulmonary hypertension. Biatrial   enlargement. Mild mitral regurgitation.  Antimicrobials: None   Subjective: Patient is improving.   Shortness of breath is improving.   However, patient still gets dyspneic while speaking.   Patient is eager to be discharged back on.    Guarded prognosis.  Likelihood of readmission is high.    Objective: Vitals:   11/14/17 0728 11/14/17 0801 11/14/17 0817 11/14/17 1238  BP: 138/72 128/63  116/61  Pulse: 63 62  (!) 57  Resp: (!) 21 20  20   Temp: 97.7 F (36.5 C) 98.5 F (36.9 C)  98.3 F (36.8 C)  TempSrc: Oral Oral  Oral  SpO2: 100% 100% 100% 98%  Weight:      Height:        Intake/Output Summary (Last 24 hours) at 11/14/2017 1612 Last data filed at 11/14/2017 1109 Gross per 24 hour  Intake 630 ml  Output 1000 ml  Net -370 ml   Filed Weights   11/12/17 0532 11/13/17 0341 11/14/17 0432  Weight: 70.3 kg 67.7 kg 66.2 kg    Examination:  General exam: Pleasant elderly female, moderately built and obese sitting up comfortably in chair. Respiratory system: Bibasilar crackles posteriorly.   Cardiovascular system: S1 and S2 heard Gastrointestinal system: Abdomen is nondistended, soft and nontender. Normal bowel sounds heard.  Stable. Extremities:Trace edema CNS: Alert and oriented.  No focal neurological deficits.   Data Reviewed: I have personally reviewed following labs and imaging studies  CBC: Recent Labs  Lab 11/10/17 0500 11/12/17 0524 11/14/17 0242  WBC 5.0 4.5 4.5  NEUTROABS 3.2 3.0  --   HGB 11.5* 10.9* 11.6*  HCT 39.1 29.2* 37.5  MCV 99.5 103.2* 97.7  PLT 214 184 629   Basic Metabolic Panel: Recent Labs  Lab 11/10/17 0500 11/11/17 0716 11/11/17 1700 11/12/17 0524 11/13/17 0403 11/14/17 0242    NA 142 140  --  137 138 138  K 3.7 4.0 4.5 4.2 3.9 3.8  CL 102 99  --  94* 94* 93*  CO2 33* 36*  --  37* 37* 36*  GLUCOSE 106* 109*  --  113* 108* 110*  BUN 18 18  --  18 20 25*  CREATININE 1.01* 1.03*  --  1.19* 1.12* 1.24*  CALCIUM 9.0 8.6*  --  8.7* 9.0 8.8*  MG  --   --  2.0 2.2 2.2  --    GFR: Estimated Creatinine Clearance: 25.6 mL/min (A) (by C-G formula based on SCr of 1.24 mg/dL (H)).  Radiology Studies: No results found.      Scheduled Meds: . apixaban  5 mg Oral BID  . cholecalciferol  1,000 Units Oral Daily  . ferrous sulfate  325 mg Oral BID WC  . furosemide  40 mg Intravenous Q12H  . loratadine  10 mg Oral Daily  . mometasone-formoterol  2 puff Inhalation BID  . pantoprazole  40 mg Oral Daily  . sodium chloride flush  3 mL Intravenous Q12H   Continuous Infusions: . sodium chloride       LOS: 4 days    Bonnell Public,  MD. [facility] Pager [phone]  If 7PM-7AM, please contact night-coverage [email or web address] Password [email or web address] 11/14/2017, 4:12 PM

## 2017-11-15 LAB — RENAL FUNCTION PANEL
Albumin: 3.8 g/dL (ref 3.5–5.0)
Anion gap: 11 (ref 5–15)
BUN: 27 mg/dL — ABNORMAL HIGH (ref 8–23)
CO2: 33 mmol/L — ABNORMAL HIGH (ref 22–32)
Calcium: 9.1 mg/dL (ref 8.9–10.3)
Chloride: 94 mmol/L — ABNORMAL LOW (ref 98–111)
Creatinine, Ser: 1.23 mg/dL — ABNORMAL HIGH (ref 0.44–1.00)
GFR calc Af Amer: 43 mL/min — ABNORMAL LOW (ref >60)
GFR calc non Af Amer: 37 mL/min — ABNORMAL LOW (ref >60)
Glucose, Bld: 115 mg/dL — ABNORMAL HIGH (ref 70–99)
Phosphorus: 4.5 mg/dL (ref 2.5–4.6)
Potassium: 4.1 mmol/L (ref 3.5–5.1)
Sodium: 138 mmol/L (ref 135–145)

## 2017-11-15 LAB — MAGNESIUM: Magnesium: 2.6 mg/dL — ABNORMAL HIGH (ref 1.7–2.4)

## 2017-11-15 MED ORDER — FUROSEMIDE 40 MG PO TABS: 40.0000 mg | ORAL_TABLET | Freq: Two times a day (BID) | ORAL | 0 refills | Status: AC

## 2017-11-15 MED ORDER — MOMETASONE FURO-FORMOTEROL FUM 100-5 MCG/ACT IN AERO: 2.0000 | INHALATION_SPRAY | Freq: Two times a day (BID) | RESPIRATORY_TRACT | 0 refills | Status: DC

## 2017-11-15 MED ORDER — ACETAMINOPHEN 325 MG PO TABS: 650.0000 mg | ORAL_TABLET | ORAL | 0 refills | Status: AC | PRN

## 2017-11-15 NOTE — Progress Notes (Addendum)
Progress Note  Patient Name: [name] Date of Encounter: 11/15/2017  Primary Cardiologist: Mertie Moores, MD   Subjective   Doing ok this morning. Breathing ok. No CP or palpitations.   Inpatient Medications    Scheduled Meds: . apixaban  5 mg Oral BID  . cholecalciferol  1,000 Units Oral Daily  . ferrous sulfate  325 mg Oral BID WC  . furosemide  40 mg Intravenous Q12H  . loratadine  10 mg Oral Daily  . mometasone-formoterol  2 puff Inhalation BID  . pantoprazole  40 mg Oral Daily  . sodium chloride flush  3 mL Intravenous Q12H   Continuous Infusions: . sodium chloride     PRN Meds: sodium chloride, acetaminophen, albuterol, docusate sodium, ondansetron (ZOFRAN) IV, polyethylene glycol, sodium chloride flush   Vital Signs    Vitals:   11/15/17 0152 11/15/17 0526 11/15/17 0913 11/15/17 1047  BP:  127/66  (!) 120/100  Pulse:  67    Resp:  20    Temp:  97.6 F (36.4 C)    TempSrc:  Oral    SpO2:  96% 96%   Weight: 66.3 kg     Height:        Intake/Output Summary (Last 24 hours) at 11/15/2017 1140 Last data filed at 11/15/2017 1028 Gross per 24 hour  Intake 510 ml  Output 200 ml  Net 310 ml   Filed Weights   11/13/17 0341 11/14/17 0432 11/15/17 0152  Weight: 67.7 kg 66.2 kg 66.3 kg    Telemetry    NSR, occasional isolated PVCs, one 6 beat run of NSVT- Personally Reviewed  ECG    Not performed today - Personally Reviewed  Physical Exam   GEN: No acute distress.   Neck: No JVD Cardiac: RRR, no murmurs, rubs, or gallops.  Respiratory: crackles at RRL that resolve after deep cough GI: Soft, nontender, non-distended  MS: No edema; No deformity. Neuro:  Nonfocal  Psych: Normal affect   Labs    Chemistry Recent Labs  Lab 11/13/17 0403 11/14/17 0242 11/15/17 0531  NA 138 138 138  K 3.9 3.8 4.1  CL 94* 93* 94*  CO2 37* 36* 33*  GLUCOSE 108* 110* 115*  BUN 20 25* 27*  CREATININE 1.12* 1.24* 1.23*  CALCIUM 9.0 8.8* 9.1  ALBUMIN  --    --  3.8  GFRNONAA 42* 37* 37*  GFRAA 49* 43* 43*  ANIONGAP 7 9 11      Hematology Recent Labs  Lab 11/10/17 0500 11/12/17 0524 11/14/17 0242  WBC 5.0 4.5 4.5  RBC 3.93 2.83* 3.84*  HGB 11.5* 10.9* 11.6*  HCT 39.1 29.2* 37.5  MCV 99.5 103.2* 97.7  MCH 29.3 38.5* 30.2  MCHC 29.4* 29.5* 30.9  RDW 11.6 13.1 12.1  PLT 214 184 202    Cardiac EnzymesNo results for input(s): TROPONINI in the last 168 hours.  Recent Labs  Lab 11/10/17 0507  TROPIPOC 0.08     BNP Recent Labs  Lab 11/10/17 0500  BNP 1,411.4*     DDimer No results for input(s): DDIMER in the last 168 hours.   Radiology    No results found.  Cardiac Studies   2D Echo 11/11/17 Study Conclusions  - Left ventricle: The cavity size was normal. Wall thickness was normal. Systolic function was moderately to severely reduced. The estimated ejection fraction was in the range of 30% to 35%. Diffuse hypokinesis. Doppler parameters are consistent with abnormal left ventricular relaxation (grade 1 diastolic dysfunction). -  Aortic valve: There was no stenosis. - Mitral valve: Moderately calcified annulus. There was mild regurgitation. - Left atrium: The atrium was severely dilated. - Right ventricle: The cavity size was normal. Systolic function was normal. - Right atrium: The atrium was mildly dilated. - Tricuspid valve: Peak RV-RA gradient (S): 32 mm Hg. - Pulmonary arteries: PA peak pressure: 40 mm Hg (S). - Systemic veins: IVC measured 2.7 cm with > 50% respirophasic variation, suggesting RA pressure 8 mmHg.  Impressions:  - Normal LV size with EF 30-35%, diffuse hypokinesis. Normal RV size and systolic function. Mild pulmonary hypertension. Biatrial enlargement. Mild mitral regurgitation.  Patient Profile   [name] is a 84 y.o. female with a hx of chronic systolic and diastolic HF, PAF, chronic anticoagulation, h/o NSVT, h/o CVA, COPD on chronic home O2 (2L/min  baseline), remote h/o perforated bowel treated with emergency surgery and hypothyroidism. She has been admitted for acute on chronic combined systolic and diastolic CHF and is being seen today for the evaluation of NSVT and acute on chronic systolic and diastolic CHF at the request of Dr. Algis Liming, Internal Medicine.  Assessment & Plan   1. NSVT: in the setting of cardiomyopathy, with EF of 30-35%. She has had systolic HF for many years. She has prior h/o NSVT, previously treated w/  blocker but discontinued in 2018, presumed due to bradycardia. She was noted to have recurrence with a 8 beat run 2 days ago but completely asymptomatic. Another 6 beat run today but no symptoms. Given her advanced age, borderline bradycardia and lack of symptoms we have recommended no treatment.   2. Acute on Chronic Combined Systolic and Diastolic HF: volume and symptoms overall improved. Net I/Os -2.8L since admit. Slight bump in SCr with diuresis from 1.03>>1.23. Transition back to PO Lasix. She was previously on 40 mg PO at home prior to admit. We recommend transitioning to 40 mg PO BID.  We can arrange outpatient f/u if home today and f/u BMP in clinic.   3. PAF: NSR with PVCs on tele. HR controlled in the 60s-70s. Continue Eliquis for a/c.   4. COPD: on chronic home O2. 2L/min baseline. Management per primary.   For questions or updates, please contact [place] Please consult [email or web address] for contact info under     Signed, Lyda Jester, PA-C  11/15/2017, 11:40 AM    The patient was seen, examined and discussed with Brittainy M. Rosita Fire, PA-C and I agree with the above.   The patient appears euvolemic, I will switch lasix to 40 mg PO BID, she can be discharged today, we will arrange for an outpatient follow up with labs, Crea is stable today.  Ena Dawley, MD 11/15/2017

## 2017-11-15 NOTE — Progress Notes (Signed)
Nsg asked to relay msg to hospitalist from cardiology that they had signed off on patient and from their standpoint she could be released.  Dr. Marcie Bal notified.

## 2017-11-15 NOTE — Progress Notes (Signed)
Patient discharged from unit via wheelchair to [facility]vt auto home. Family to provided transport.  All discharge instructions reviewed with patient.   Meds, follow up appts reviewed with patient and family.   Patient displays no s/sx of distress.

## 2017-12-02 ENCOUNTER — Encounter: Payer: Self-pay | Admitting: Physician Assistant

## 2017-12-02 NOTE — Progress Notes (Deleted)
Cardiology Office Note    Date:  12/02/2017  ID:  [name], DOB 1925/06/03, MRN [id number] PCP:  Leonard Downing, MD  Cardiologist:  Mertie Moores, MD   Chief Complaint: f/u CHF  History of Present Illness:  [name] is a 84 y.o. female with history of chronic systolic and diastolic HF, PAF, chronic anticoagulation, NSVT, PVCs, CVA, CKD III by labs, COPD on chronic home O2 (2L/min baseline), remote h/o perforated bowel treated with emergency surgery (subsequent reversal of colostomy) and hypothyroidism who presents for post-hospital follow-up.  Her cardiac history includes prior EF of 40-45%. In April 2017, she was hospitalized for CVA. Initial 30 day monitor showed no Afib, however she was found to have afib later on that year in Nov 2017 during a hospitalization for COPD. Plavix was discontinued and she was started on Eliquis. She has never had a cardiac cath. She did have a nuclear stress test in 2009 that showed no ischemia but abnormal due to reduced LVF. She also has a prior h/o NSVT. Cardiology was consulted 12/29/2015 for NSVT. EF at that time was 45-50%. She was seen by Dr. Tamala Julian at that time and treatment with a ? blocker was recommended. Bisoprolol was added due to her pulmonary issues (COPD). She was monitored and did well w/o any recurrent VT. Her bisoprolol was discontinued in 2018 but reason for discontinuation was not outlined in chart, presumed possibly bradycardia, More recently she was admitted 11/2017 with heart failure and EF decline to 30-35% with diffuse HK, mild MR, mild pulm HTN. She was diuresed with Lasix. Cardiology also followed for NSVT who felt that given her advanced age, borderline bradycardia and lack of symptoms, no further intervention was advised. She was in NSR with PVCs otherwise. DC labs include Mg 2.6, BMET K 4.1, CO2 33, BUN 27, Cr 1.23, Hgb 11.6, 05/2017 LDL 74. Prior Cr in 05/2017 was 0.57 but 1.22 just before that.  bmet tsh  Chronic combined  CHF Renal insufficiency suspect CKD III NSVT PAF    Past Medical History:  Diagnosis Date  . Anemia    years ago  . Arthritis    "right shoulder" (01/02/2012)  . Bladder mass   . CAP (community acquired pneumonia) 01/02/2012   Lower lobe   . Chronic combined systolic and diastolic CHF (congestive heart failure) ([place])   . Chronic respiratory failure ([place])   . COPD (chronic obstructive pulmonary disease) ([place])   . Difficulty sleeping   . Diverticulitis of colon with perforation 01/14/2012   That is post sigmoid resection with [facility] pouch and end colostomy   . GERD (gastroesophageal reflux disease)    occasional  . Goiter    radioactive iodine ablation/notes 01/02/2012  . History of kidney stones    X 1  . Hypertension   . Hypothyroidism    "I have taken Synthroid before" (01/02/2012)   HAS HAD RADIOACTIVE IODINE TX 2 YR S AGO  . Intra-abdominal abscess ([place]) 01/14/2012  . NSVT (nonsustained ventricular tachycardia) ([place])   . PAF (paroxysmal atrial fibrillation) ([place])   . Pneumatosis of intestines 01/14/2012  . Pneumonia 01/02/2012; ? 2009  . PVC's (premature ventricular contractions)   . Radiation 08/10/13-09/15/13   57 gray to vaginal/bladder mass  . SOB (shortness of breath)    'all the time" (01/02/2012)  . Stroke ([place])   . UTI (lower urinary tract infection) 01/03/2012    Past Surgical History:  Procedure Laterality Date  . ABDOMINAL HYSTERECTOMY  1980's  .  APPENDECTOMY  1980's   "when they did hysterectomy" (01/02/2012)  . CATARACT EXTRACTION W/ INTRAOCULAR LENS  IMPLANT, BILATERAL  ?1990's   [place]  . COLON SURGERY    . COLOSTOMY N/A 03/12/2012   Procedure: COLOSTOMY;  Surgeon: Ralene Ok, MD;  Location: [facility];  Service: General;  Laterality: N/A;  . COLOSTOMY REVISION N/A 03/12/2012   Procedure: COLON RESECTION SIGMOID ;  Surgeon: Ralene Ok, MD;  Location: [facility];  Service: General;  Laterality: N/A;  . COLOSTOMY TAKEDOWN N/A 10/21/2012   Procedure: LAPAROSCOPIC  COLOSTOMY TAKEDOWN AND HARTMANS ANASTOMOSIS, rigid proctoscopy;  Surgeon: Ralene Ok, MD;  Location: [facility];  Service: General;  Laterality: N/A;  . EYE SURGERY    . LYSIS OF ADHESION N/A 10/21/2012   Procedure: LYSIS OF ADHESION;  Surgeon: Ralene Ok, MD;  Location: [facility];  Service: General;  Laterality: N/A;  . TRANSURETHRAL RESECTION OF BLADDER TUMOR WITH GYRUS (TURBT-GYRUS) N/A 06/30/2013   Procedure: TRANSURETHRAL RESECTION OF BLADDER TUMOR WITH GYRUS (TURBT-GYRUS)/VAGINAL BIOPSY;  Surgeon: Ardis Hughs, MD;  Location: [facility];  Service: Urology;  Laterality: N/A;    Current Medications: No outpatient medications have been marked as taking for the 12/03/17 encounter (Appointment) with Charlie Pitter, PA-C.   ***   Allergies:   Indomethacin   Social History   Socioeconomic History  . Marital status: Widowed    Spouse name: Not on file  . Number of children: 6  . Years of education: Not on file  . Highest education level: Not on file  Occupational History  . Occupation: retired  Scientific laboratory technician  . Financial resource strain: Not on file  . Food insecurity:    Worry: Not on file    Inability: Not on file  . Transportation needs:    Medical: Not on file    Non-medical: Not on file  Tobacco Use  . Smoking status: Former Smoker    Packs/day: 0.12    Years: 35.00    Pack years: 4.20    Types: Cigarettes    Last attempt to quit: 01/02/1991    Years since quitting: 26.9  . Smokeless tobacco: Never Used  Substance and Sexual Activity  . Alcohol use: No  . Drug use: No  . Sexual activity: Never  Lifestyle  . Physical activity:    Days per week: Not on file    Minutes per session: Not on file  . Stress: Not on file  Relationships  . Social connections:    Talks on phone: Not on file    Gets together: Not on file    Attends religious service: Not on file    Active member of club or organization: Not on file    Attends meetings of clubs or organizations: Not on  file    Relationship status: Not on file  Other Topics Concern  . Not on file  Social History Narrative  . Not on file     Family History:  The patient's ***family history includes Cancer in her sister; Heart disease in her mother and unknown relative; Stroke in her sister.  ROS:   Please see the history of present illness. Otherwise, review of systems is positive for ***.  All other systems are reviewed and otherwise negative.    PHYSICAL EXAM:   VS:  There were no vitals taken for this visit.  BMI: There is no height or weight on file to calculate BMI. GEN: Well nourished, well developed, in no acute distress HEENT: normocephalic,  atraumatic Neck: no JVD, carotid bruits, or masses Cardiac: ***RRR; no murmurs, rubs, or gallops, no edema  Respiratory:  clear to auscultation bilaterally, normal work of breathing GI: soft, nontender, nondistended, + BS MS: no deformity or atrophy Skin: warm and dry, no rash Neuro:  Alert and Oriented x 3, Strength and sensation are intact, follows commands Psych: euthymic mood, full affect  Wt Readings from Last 3 Encounters:  11/15/17 146 lb 1.6 oz (66.3 kg)  10/11/17 158 lb 12.8 oz (72 kg)  08/14/17 154 lb (69.9 kg)      Studies/Labs Reviewed:   EKG:  EKG was ordered today and personally reviewed by me and demonstrates *** EKG was not ordered today.***  Recent Labs: 04/28/2017: ALT 8 11/10/2017: B Natriuretic Peptide 1,411.4 11/14/2017: Hemoglobin 11.6; Platelets 202 11/15/2017: BUN 27; Creatinine, Ser 1.23; Magnesium 2.6; Potassium 4.1; Sodium 138   Lipid Panel    Component Value Date/Time   CHOL 118 05/01/2017 0728   TRIG 56 05/01/2017 0728   HDL 33 (L) 05/01/2017 0728   CHOLHDL 3.6 05/01/2017 0728   VLDL 11 05/01/2017 0728   LDLCALC 74 05/01/2017 0728    Additional studies/ records that were reviewed today include: Summarized above.***    ASSESSMENT & PLAN:   1. ***  Disposition: F/u with ***   Medication  Adjustments/Labs and Tests Ordered: Current medicines are reviewed at length with the patient today.  Concerns regarding medicines are outlined above. Medication changes, Labs and Tests ordered today are summarized above and listed in the Patient Instructions accessible in Encounters.   Signed, Charlie Pitter, PA-C  12/02/2017 6:45 AM    [facility] [address], [place], [place]  [address] Phone: [phone]; Fax: [phone]

## 2017-12-03 ENCOUNTER — Ambulatory Visit: Payer: Medicare Other | Admitting: Physician Assistant

## 2017-12-09 DIAGNOSIS — I4729 Other ventricular tachycardia: Secondary | ICD-10-CM | POA: Insufficient documentation

## 2017-12-09 DIAGNOSIS — I472 Ventricular tachycardia: Secondary | ICD-10-CM | POA: Insufficient documentation

## 2017-12-09 NOTE — Progress Notes (Signed)
Cardiology Office Note    Date:  12/10/2017   ID:  [name], DOB [birth year], MRN [id number]  PCP:  Jacqueline Downing, MD  Cardiologist: Jacqueline Moores, MD EPS: None  No chief complaint on file.    History of Present Illness:  [name] is a 84 y.o. female with history of chronic systolic and diastolic CHF, PAF on Eliquis, history of NSVT, CVA, COPD on home O2.    Recent admission for acute on chronic combined systolic and diastolic CHF and developed NSVT-8 beat run in 6 beat run which she was asymptomatic with..  She had previously been treated with beta-blocker but this was stopped in 2018 presumed secondary to bradycardia.  EF 30 to 35%.  Because of her age, borderline bradycardia and lack of symptoms no treatment was recommended.  She diuresed 2.8 L  Patient comes in today accompanied by her son.  She is trying to follow a low-salt diet.  No further swelling.  She has chronic dyspnea on exertion.  She does have a large thyroid goiter that is compressing on her pharynx.  She has seen ENT in the past for this.    Past Medical History:  Diagnosis Date  . Anemia    years ago  . Arthritis    "right shoulder" (01/02/2012)  . Bladder mass   . CAP (community acquired pneumonia) 01/02/2012   Lower lobe   . Chronic combined systolic and diastolic CHF (congestive heart failure) ([place])   . Chronic respiratory failure ([place])   . COPD (chronic obstructive pulmonary disease) ([place])   . Difficulty sleeping   . Diverticulitis of colon with perforation 01/14/2012   That is post sigmoid resection with [facility] pouch and end colostomy   . GERD (gastroesophageal reflux disease)    occasional  . Goiter    radioactive iodine ablation/notes 01/02/2012  . History of kidney stones    X 1  . Hypertension   . Hypothyroidism    "I have taken Synthroid before" (01/02/2012)   HAS HAD RADIOACTIVE IODINE TX 2 YR S AGO  . Intra-abdominal abscess ([place]) 01/14/2012  . NSVT (nonsustained ventricular  tachycardia) ([place])   . PAF (paroxysmal atrial fibrillation) ([place])   . Pneumatosis of intestines 01/14/2012  . Pneumonia 01/02/2012; ? 2009  . PVC's (premature ventricular contractions)   . Radiation 08/10/13-09/15/13   57 gray to vaginal/bladder mass  . SOB (shortness of breath)    'all the time" (01/02/2012)  . Stroke ([place])   . UTI (lower urinary tract infection) 01/03/2012    Past Surgical History:  Procedure Laterality Date  . ABDOMINAL HYSTERECTOMY  1980's  . APPENDECTOMY  1980's   "when they did hysterectomy" (01/02/2012)  . CATARACT EXTRACTION W/ INTRAOCULAR LENS  IMPLANT, BILATERAL  ?1990's   [place]  . COLON SURGERY    . COLOSTOMY N/A 03/12/2012   Procedure: COLOSTOMY;  Surgeon: Ralene Ok, MD;  Location: [place];  Service: General;  Laterality: N/A;  . COLOSTOMY REVISION N/A 03/12/2012   Procedure: COLON RESECTION SIGMOID ;  Surgeon: Ralene Ok, MD;  Location: [place];  Service: General;  Laterality: N/A;  . COLOSTOMY TAKEDOWN N/A 10/21/2012   Procedure: LAPAROSCOPIC COLOSTOMY TAKEDOWN AND HARTMANS ANASTOMOSIS, rigid proctoscopy;  Surgeon: Ralene Ok, MD;  Location: [facility];  Service: General;  Laterality: N/A;  . EYE SURGERY    . LYSIS OF ADHESION N/A 10/21/2012   Procedure: LYSIS OF ADHESION;  Surgeon: Ralene Ok, MD;  Location: [facility];  Service: General;  Laterality: N/A;  . TRANSURETHRAL RESECTION OF BLADDER TUMOR WITH GYRUS (TURBT-GYRUS) N/A 06/30/2013   Procedure: TRANSURETHRAL RESECTION OF BLADDER TUMOR WITH GYRUS (TURBT-GYRUS)/VAGINAL BIOPSY;  Surgeon: Ardis Hughs, MD;  Location: [facility];  Service: Urology;  Laterality: N/A;    Current Medications: Current Meds  Medication Sig  . acetaminophen (TYLENOL) 325 MG tablet Take 2 tablets (650 mg total) by mouth every 4 (four) hours as needed for headache or mild pain.  Marland Kitchen albuterol (PROVENTIL HFA;VENTOLIN HFA) 108 (90 BASE) MCG/ACT inhaler Inhale 2 puffs into the lungs every 6 (six) hours as needed for wheezing.  Marland Kitchen  albuterol (PROVENTIL) (2.5 MG/3ML) 0.083% nebulizer solution Take 3 mLs (2.5 mg total) by nebulization every 6 (six) hours as needed for wheezing or shortness of breath.  Marland Kitchen apixaban (ELIQUIS) 5 MG TABS tablet Take 1 tablet (5 mg total) by mouth 2 (two) times daily.  . cetirizine (ZYRTEC) 10 MG tablet Take 10 mg by mouth daily as needed for allergies.   . cholecalciferol (VITAMIN D) 1000 units tablet Take 1,000 Units by mouth daily.  Marland Kitchen docusate sodium (COLACE) 100 MG capsule Take 1 capsule (100 mg total) by mouth 2 (two) times daily. (Patient taking differently: Take 100 mg by mouth 2 (two) times daily as needed for mild constipation. )  . ferrous sulfate 325 (65 FE) MG tablet Take 325 mg by mouth 2 (two) times daily with a meal.   . fluticasone (FLONASE) 50 MCG/ACT nasal spray Place 1 spray into both nostrils daily as needed for allergies.   . furosemide (LASIX) 40 MG tablet Take 1 tablet (40 mg total) by mouth 2 (two) times daily.  . mometasone-formoterol (DULERA) 100-5 MCG/ACT AERO Inhale 2 puffs into the lungs 2 (two) times daily.  . pantoprazole (PROTONIX) 40 MG tablet Take 1 tablet (40 mg total) by mouth daily.  . polyethylene glycol (MIRALAX / GLYCOLAX) packet Take 17 g by mouth daily as needed for mild constipation.   . polyvinyl alcohol (LIQUIFILM TEARS) 1.4 % ophthalmic solution Place 1 drop into both eyes as needed for dry eyes.     Allergies:   Indomethacin   Social History   Socioeconomic History  . Marital status: Widowed    Spouse name: Not on file  . Number of children: 6  . Years of education: Not on file  . Highest education level: Not on file  Occupational History  . Occupation: retired  Scientific laboratory technician  . Financial resource strain: Not on file  . Food insecurity:    Worry: Not on file    Inability: Not on file  . Transportation needs:    Medical: Not on file    Non-medical: Not on file  Tobacco Use  . Smoking status: Former Smoker    Packs/day: 0.12    Years:  35.00    Pack years: 4.20    Types: Cigarettes    Last attempt to quit: 01/02/1991    Years since quitting: 26.9  . Smokeless tobacco: Never Used  Substance and Sexual Activity  . Alcohol use: No  . Drug use: No  . Sexual activity: Never  Lifestyle  . Physical activity:    Days per week: Not on file    Minutes per session: Not on file  . Stress: Not on file  Relationships  . Social connections:    Talks on phone: Not on file    Gets together: Not on file    Attends religious service: Not on file  Active member of club or organization: Not on file    Attends meetings of clubs or organizations: Not on file    Relationship status: Not on file  Other Topics Concern  . Not on file  Social History Narrative  . Not on file     Family History:  The patient's family history includes Cancer in her sister; Heart disease in her mother and unknown relative; Stroke in her sister.   ROS:   Please see the history of present illness.    Review of Systems  HENT: Positive for hearing loss.   Cardiovascular: Positive for dyspnea on exertion.  Musculoskeletal: Positive for arthritis.  Neurological: Positive for loss of balance.   All other systems reviewed and are negative.   PHYSICAL EXAM:   VS:  BP (!) 142/82   Pulse 75   Ht 5' (1.524 m)   Wt 150 lb 3.2 oz (68.1 kg)   SpO2 93%   BMI 29.33 kg/m   Physical Exam  GEN: Well nourished, well developed, in no acute distress  Neck: Large thyroid goiter on her right neck no JVD, carotid bruits, or masses Cardiac:RRR; 1/6 to 2/6 systolic murmur left sternal border Respiratory:  clear to auscultation bilaterally, normal work of breathing GI: soft, nontender, nondistended, + BS Ext: without cyanosis, clubbing, or edema, Good distal pulses bilaterally Neuro:  Alert and Oriented x 3 Psych: euthymic mood, full affect  Wt Readings from Last 3 Encounters:  12/10/17 150 lb 3.2 oz (68.1 kg)  11/15/17 146 lb 1.6 oz (66.3 kg)  10/11/17 158 lb  12.8 oz (72 kg)      Studies/Labs Reviewed:   EKG:  EKG is not ordered today.   Recent Labs: 04/28/2017: ALT 8 11/10/2017: B Natriuretic Peptide 1,411.4 11/14/2017: Hemoglobin 11.6; Platelets 202 11/15/2017: BUN 27; Creatinine, Ser 1.23; Magnesium 2.6; Potassium 4.1; Sodium 138   Lipid Panel    Component Value Date/Time   CHOL 118 05/01/2017 0728   TRIG 56 05/01/2017 0728   HDL 33 (L) 05/01/2017 0728   CHOLHDL 3.6 05/01/2017 0728   VLDL 11 05/01/2017 0728   LDLCALC 74 05/01/2017 0728    Additional studies/ records that were reviewed today include:  2D echo 11/11/20192D Echo 11/11/17 Study Conclusions   - Left ventricle: The cavity size was normal. Wall thickness was   normal. Systolic function was moderately to severely reduced. The   estimated ejection fraction was in the range of 30% to 35%.   Diffuse hypokinesis. Doppler parameters are consistent with   abnormal left ventricular relaxation (grade 1 diastolic   dysfunction). - Aortic valve: There was no stenosis. - Mitral valve: Moderately calcified annulus. There was mild   regurgitation. - Left atrium: The atrium was severely dilated. - Right ventricle: The cavity size was normal. Systolic function   was normal. - Right atrium: The atrium was mildly dilated. - Tricuspid valve: Peak RV-RA gradient (S): 32 mm Hg. - Pulmonary arteries: PA peak pressure: 40 mm Hg (S). - Systemic veins: IVC measured 2.7 cm with > 50% respirophasic   variation, suggesting RA pressure 8 mmHg.   Impressions:   - Normal LV size with EF 30-35%, diffuse hypokinesis. Normal RV   size and systolic function. Mild pulmonary hypertension. Biatrial   enlargement. Mild mitral regurgitation.     ASSESSMENT:    1. Chronic combined systolic and diastolic CHF (congestive heart failure) ([place])   2. PAF (paroxysmal atrial fibrillation) ([place])   3. NSVT (nonsustained ventricular tachycardia) ([place])  4. Essential hypertension      PLAN:  In  order of problems listed above:  Chronic combined systolic and diastolic CHF with recent hospitalization diuresed with IV Lasix.  LVEF 30 to 35% on echo 11/11/2017 currently compensated.  We will follow-up renal function today.  Continue current meds.  Follow-up with Dr. Acie Fredrickson in 3 to 4 months.  PAF on Eliquis no bleeding problems  NSVT has been asymptomatic and because of bradycardia and her age no treatment given.  Essential hypertension blood pressure well controlled     Medication Adjustments/Labs and Tests Ordered: Current medicines are reviewed at length with the patient today.  Concerns regarding medicines are outlined above.  Medication changes, Labs and Tests ordered today are listed in the Patient Instructions below. There are no Patient Instructions on file for this visit.   Sumner Boast, PA-C  12/10/2017 11:42 AM    [facility] [address], [place], [place]  [address] Phone: [phone]; Fax: [phone]

## 2017-12-10 ENCOUNTER — Encounter: Payer: Self-pay | Admitting: Physician Assistant

## 2017-12-10 ENCOUNTER — Ambulatory Visit: Payer: Medicare Other | Admitting: Physician Assistant

## 2017-12-10 VITALS — BP 142/82 | HR 75 | Ht 60.0 in | Wt 150.2 lb

## 2017-12-10 DIAGNOSIS — I4729 Other ventricular tachycardia: Secondary | ICD-10-CM

## 2017-12-10 DIAGNOSIS — I5042 Chronic combined systolic (congestive) and diastolic (congestive) heart failure: Secondary | ICD-10-CM | POA: Diagnosis not present

## 2017-12-10 DIAGNOSIS — I472 Ventricular tachycardia: Secondary | ICD-10-CM | POA: Diagnosis not present

## 2017-12-10 DIAGNOSIS — I10 Essential (primary) hypertension: Secondary | ICD-10-CM | POA: Diagnosis not present

## 2017-12-10 DIAGNOSIS — I480 Paroxysmal atrial fibrillation: Secondary | ICD-10-CM

## 2017-12-10 LAB — BASIC METABOLIC PANEL
BUN/Creatinine Ratio: 18 (ref 12–28)
BUN: 20 mg/dL (ref 10–36)
CO2: 27 mmol/L (ref 20–29)
CREATININE: 1.13 mg/dL — AB (ref 0.57–1.00)
Calcium: 9.5 mg/dL (ref 8.7–10.3)
Chloride: 101 mmol/L (ref 96–106)
GFR, EST AFRICAN AMERICAN: 49 mL/min/{1.73_m2} — AB (ref >59)
GFR, EST NON AFRICAN AMERICAN: 43 mL/min/{1.73_m2} — AB (ref >59)
Glucose: 92 mg/dL (ref 65–99)
Potassium: 4.7 mmol/L (ref 3.5–5.2)
SODIUM: 143 mmol/L (ref 134–144)

## 2017-12-10 IMAGING — CR DG CHEST 2V
2 series · 2 of 2 positions shown · non-contrast
Comparison: 03/04/2016 chest radiograph.  02/13/2014 CT chest.

CLINICAL DATA: 89 y/o F; worsening shortness of breath. History of
COPD.

EXAM:
CHEST  2 VIEW

[chest ap]
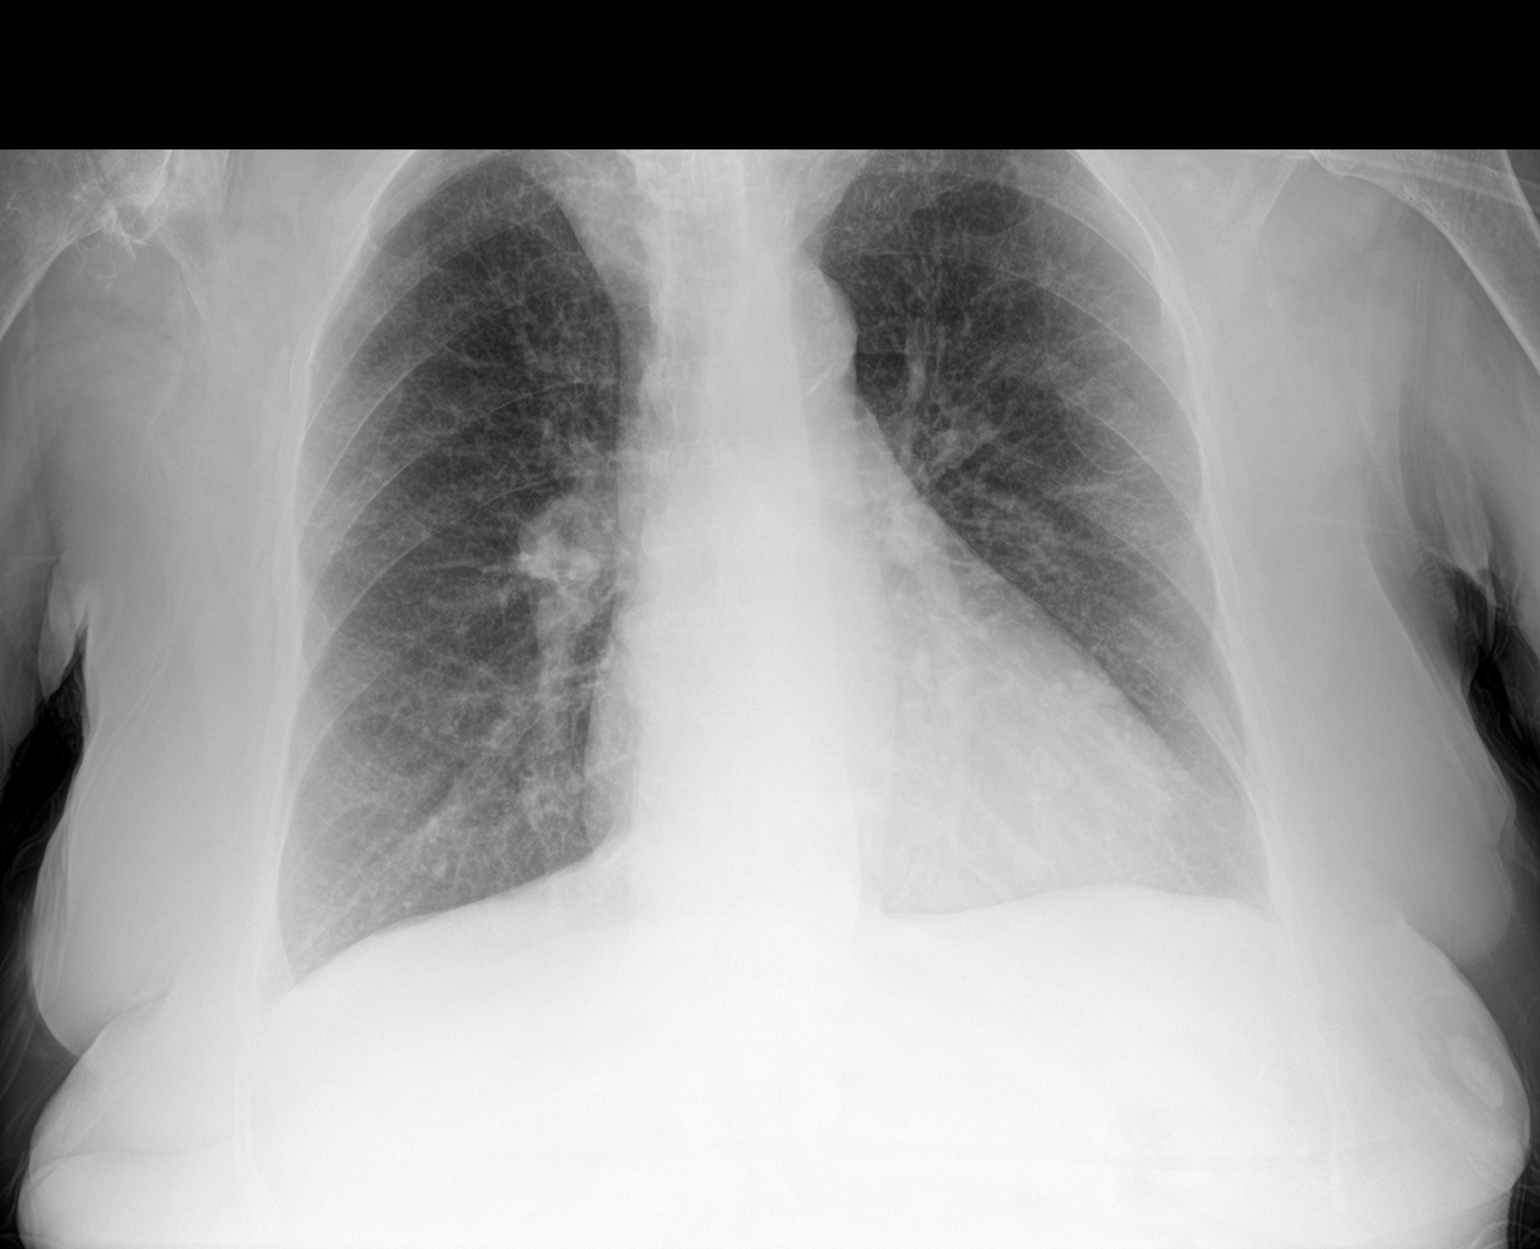

[chest lat]
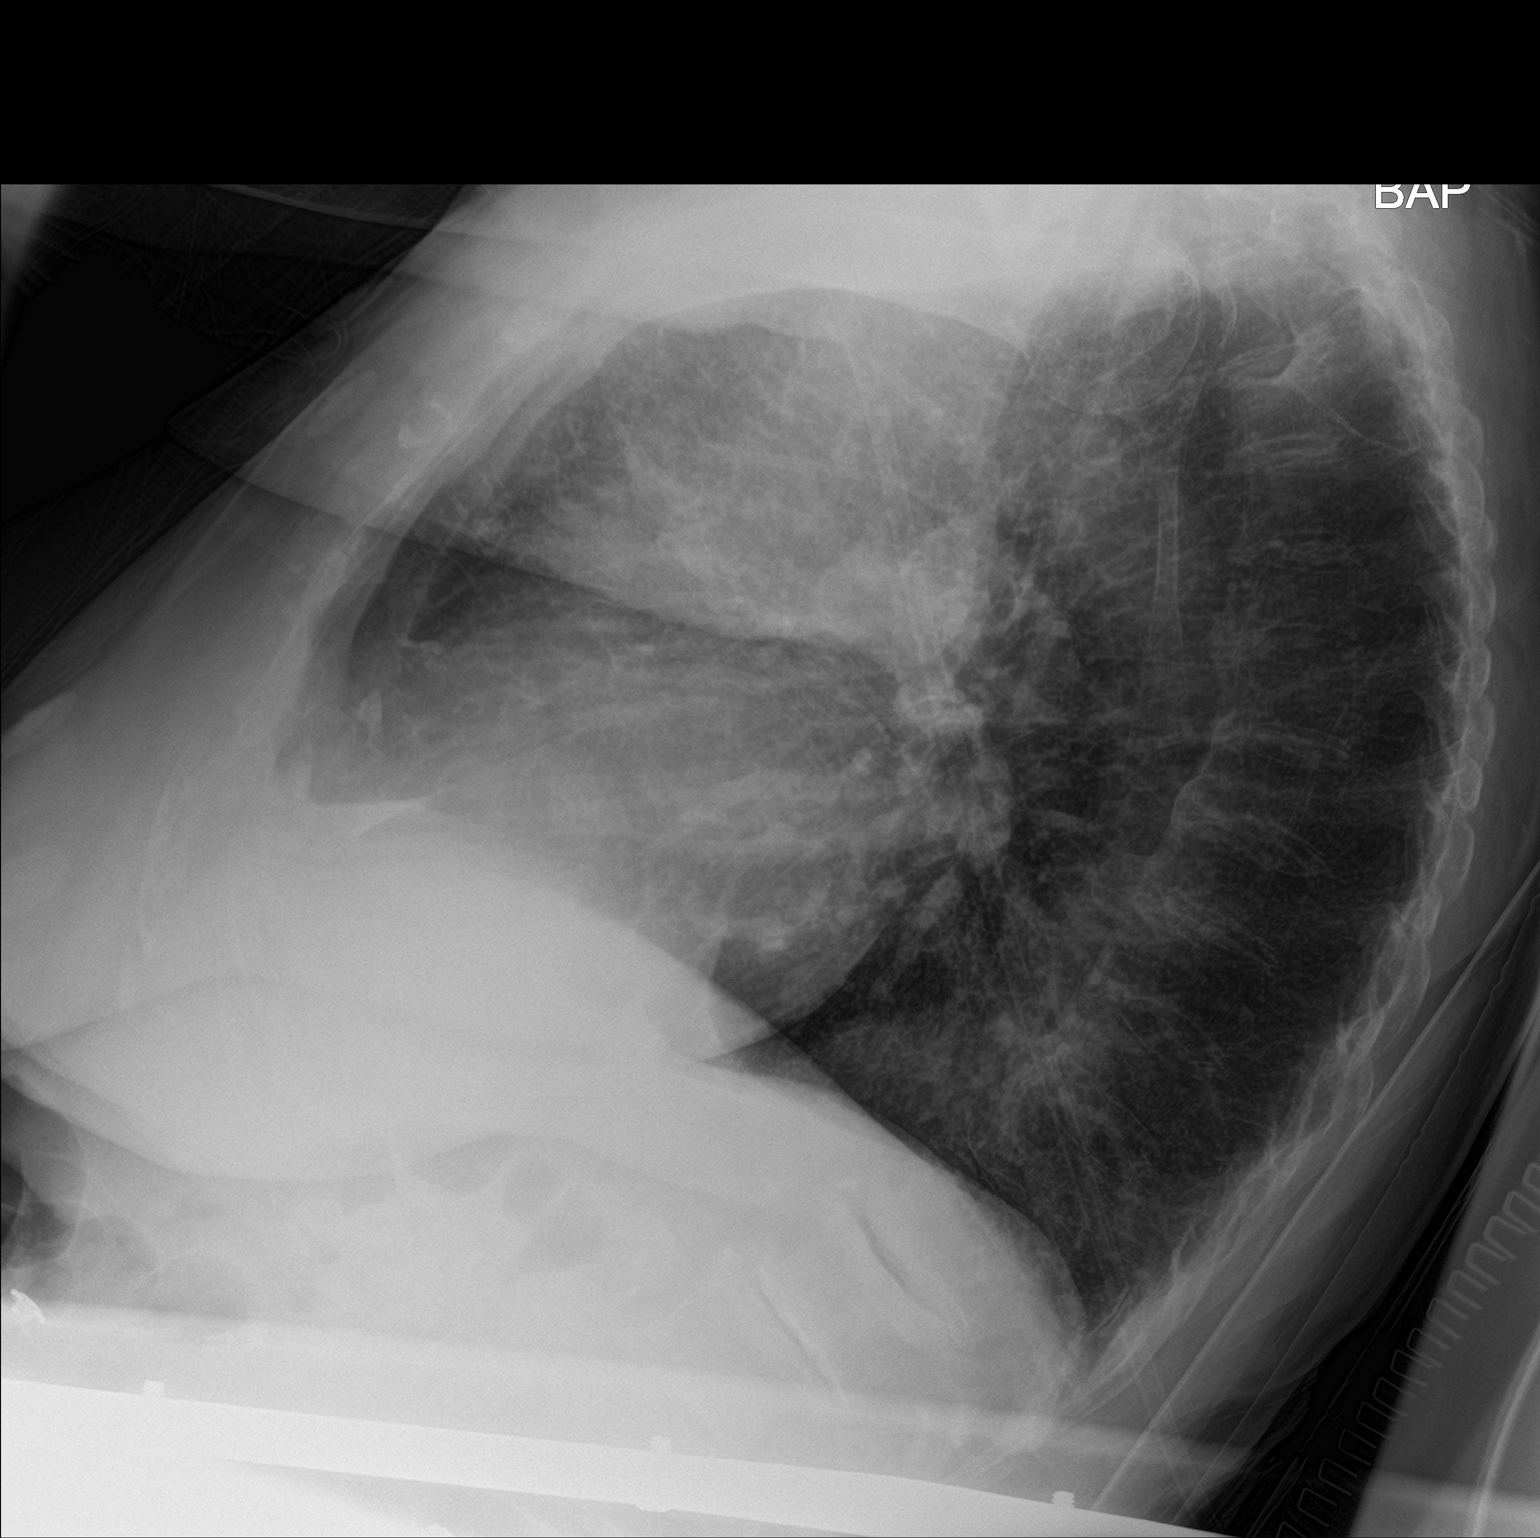

[2 of 2 positions shown; findings below may reference images not displayed]

FINDINGS: Stable cardiac silhouette given projection and technique. Aortic
atherosclerosis with calcification. Stable chronic interstitial
changes. No focal consolidation. No pleural effusion or
pneumothorax. Severe osteoarthrosis of the right glenohumeral joint.
Calcified thyroid goiter better characterized on prior CT of chest.
IMPRESSION: Stable chronic interstitial changes of the lungs. No focal
consolidation, effusion, or pneumothorax.

By: Edelir Murara M.D.

## 2017-12-10 NOTE — Patient Instructions (Signed)
Medication Instructions:  Your physician recommends that you continue on your current medications as directed. Please refer to the Current Medication list given to you today.  If you need a refill on your cardiac medications before your next appointment, please call your pharmacy.   Lab work: TODAY: BMET If you have labs (blood work) drawn today and your tests are completely normal, you will receive your results only by: Marland Kitchen MyChart Message (if you have MyChart) OR . A paper copy in the mail If you have any lab test that is abnormal or we need to change your treatment, we will call you to review the results.  Testing/Procedures: None ordered  Follow-Up: At [facility], you and your health needs are our priority.  As part of our continuing mission to provide you with exceptional heart care, we have created designated Provider Care Teams.  These Care Teams include your primary Cardiologist (physician) and Advanced Practice Providers (APPs -  Physician Assistants and Nurse Practitioners) who all work together to provide you with the care you need, when you need it. You will need a follow up appointment in:  3-4 months.  Please call our office 2 months in advance to schedule this appointment.  You may see Mertie Moores, MD or one of the following Advanced Practice Providers on your designated Care Team: Richardson Dopp, PA-C [place], [place] . Daune Perch, NP  Any Other Special Instructions Will Be Listed Below (If Applicable). Two Gram Sodium Diet 2000 mg  What is Sodium? Sodium is a mineral found naturally in many foods. The most significant source of sodium in the diet is table salt, which is about 40% sodium.  Processed, convenience, and preserved foods also contain a large amount of sodium.  The body needs only 500 mg of sodium daily to function,  A normal diet provides more than enough sodium even if you do not use salt.  Why Limit Sodium? A build up of sodium in the body can cause  thirst, increased blood pressure, shortness of breath, and water retention.  Decreasing sodium in the diet can reduce edema and risk of heart attack or stroke associated with high blood pressure.  Keep in mind that there are many other factors involved in these health problems.  Heredity, obesity, lack of exercise, cigarette smoking, stress and what you eat all play a role.  General Guidelines:  Do not add salt at the table or in cooking.  One teaspoon of salt contains over 2 grams of sodium.  Read food labels  Avoid processed and convenience foods  Ask your dietitian before eating any foods not dicussed in the menu planning guidelines  Consult your physician if you wish to use a salt substitute or a sodium containing medication such as antacids.  Limit milk and milk products to 16 oz (2 cups) per day.  Shopping Hints:  READ LABELS!! "Dietetic" does not necessarily mean low sodium.  Salt and other sodium ingredients are often added to foods during processing.   Menu Planning Guidelines Food Group Choose More Often Avoid  Beverages (see also the milk group All fruit juices, low-sodium, salt-free vegetables juices, low-sodium carbonated beverages Regular vegetable or tomato juices, commercially softened water used for drinking or cooking  Breads and Cereals Enriched white, wheat, rye and pumpernickel bread, hard rolls and dinner rolls; muffins, cornbread and waffles; most dry cereals, cooked cereal without added salt; unsalted crackers and breadsticks; low sodium or homemade bread crumbs Bread, rolls and crackers with salted tops;  quick breads; instant hot cereals; pancakes; commercial bread stuffing; self-rising flower and biscuit mixes; regular bread crumbs or cracker crumbs  Desserts and Sweets Desserts and sweets mad with mild should be within allowance Instant pudding mixes and cake mixes  Fats Butter or margarine; vegetable oils; unsalted salad dressings, regular salad dressings limited  to 1 Tbs; light, sour and heavy cream Regular salad dressings containing bacon fat, bacon bits, and salt pork; snack dips made with instant soup mixes or processed cheese; salted nuts  Fruits Most fresh, frozen and canned fruits Fruits processed with salt or sodium-containing ingredient (some dried fruits are processed with sodium sulfites        Vegetables Fresh, frozen vegetables and low- sodium canned vegetables Regular canned vegetables, sauerkraut, pickled vegetables, and others prepared in brine; frozen vegetables in sauces; vegetables seasoned with ham, bacon or salt pork  Condiments, Sauces, Miscellaneous  Salt substitute with physician's approval; pepper, herbs, spices; vinegar, lemon or lime juice; hot pepper sauce; garlic powder, onion powder, low sodium soy sauce (1 Tbs.); low sodium condiments (ketchup, chili sauce, mustard) in limited amounts (1 tsp.) fresh ground horseradish; unsalted tortilla chips, pretzels, potato chips, popcorn, salsa (1/4 cup) Any seasoning made with salt including garlic salt, celery salt, onion salt, and seasoned salt; sea salt, rock salt, kosher salt; meat tenderizers; monosodium glutamate; mustard, regular soy sauce, barbecue, sauce, chili sauce, teriyaki sauce, steak sauce, Worcestershire sauce, and most flavored vinegars; canned gravy and mixes; regular condiments; salted snack foods, olives, picles, relish, horseradish sauce, catsup   Food preparation: Try these seasonings Meats:    Pork Sage, onion Serve with applesauce  Chicken Poultry seasoning, thyme, parsley Serve with cranberry sauce  Lamb Curry powder, rosemary, garlic, thyme Serve with mint sauce or jelly  Veal Marjoram, basil Serve with current jelly, cranberry sauce  Beef Pepper, bay leaf Serve with dry mustard, unsalted chive butter  Fish [place] leaf, dill Serve with unsalted lemon butter, unsalted parsley butter  Vegetables:    Asparagus Lemon juice   Broccoli Lemon juice   Carrots Mustard  dressing parsley, mint, nutmeg, glazed with unsalted butter and sugar   Green beans Marjoram, lemon juice, nutmeg,dill seed   Tomatoes Basil, marjoram, onion   Spice /blend for [employer]" 4 tsp ground thyme 1 tsp ground sage 3 tsp ground rosemary 4 tsp ground marjoram   Test your knowledge 1. A product that says "Salt Free" may still contain sodium. True or False 2. Garlic Powder and Hot Pepper Sauce an be used as alternative seasonings.True or False 3. Processed foods have more sodium than fresh foods.  True or False 4. Canned Vegetables have less sodium than froze True or False  WAYS TO DECREASE YOUR SODIUM INTAKE 1. Avoid the use of added salt in cooking and at the table.  Table salt (and other prepared seasonings which contain salt) is probably one of the greatest sources of sodium in the diet.  Unsalted foods can gain flavor from the sweet, sour, and butter taste sensations of herbs and spices.  Instead of using salt for seasoning, try the following seasonings with the foods listed.  Remember: how you use them to enhance natural food flavors is limited only by your creativity... Allspice-Meat, fish, eggs, fruit, peas, red and yellow vegetables Almond Extract-Fruit baked goods Anise Seed-Sweet breads, fruit, carrots, beets, cottage cheese, cookies (tastes like licorice) Basil-Meat, fish, eggs, vegetables, rice, vegetables salads, soups, sauces Bay Leaf-Meat, fish, stews, poultry Burnet-Salad, vegetables (cucumber-like flavor) Caraway Seed-Bread, cookies, cottage  cheese, meat, vegetables, cheese, rice Cardamon-Baked goods, fruit, soups Celery Powder or seed-Salads, salad dressings, sauces, meatloaf, soup, bread.Do not use  celery salt Chervil-Meats, salads, fish, eggs, vegetables, cottage cheese (parsley-like flavor) Chili Power-Meatloaf, chicken cheese, corn, eggplant, egg dishes Chives-Salads cottage cheese, egg dishes, soups, vegetables, sauces Cilantro-Salsa,  casseroles Cinnamon-Baked goods, fruit, pork, lamb, chicken, carrots Cloves-Fruit, baked goods, fish, pot roast, green beans, beets, carrots Coriander-Pastry, cookies, meat, salads, cheese (lemon-orange flavor) Cumin-Meatloaf, fish,cheese, eggs, cabbage,fruit pie (caraway flavor) [employer], fruit, eggs, fish, poultry, cottage cheese, vegetables Dill Seed-Meat, cottage cheese, poultry, vegetables, fish, salads, bread Fennel Seed-Bread, cookies, apples, pork, eggs, fish, beets, cabbage, cheese, Licorice-like flavor Garlic-(buds or powder) Salads, meat, poultry, fish, bread, butter, vegetables, potatoes.Do not  use garlic salt Ginger-Fruit, vegetables, baked goods, meat, fish, poultry Horseradish Root-Meet, vegetables, butter Lemon Juice or Extract-Vegetables, fruit, tea, baked goods, fish salads Mace-Baked goods fruit, vegetables, fish, poultry (taste like nutmeg) Maple Extract-Syrups Marjoram-Meat, chicken, fish, vegetables, breads, green salads (taste like Sage) Mint-Tea, lamb, sherbet, vegetables, desserts, carrots, cabbage Mustard, Dry or Seed-Cheese, eggs, meats, vegetables, poultry Nutmeg-Baked goods, fruit, chicken, eggs, vegetables, desserts Onion Powder-Meat, fish, poultry, vegetables, cheese, eggs, bread, rice salads (Do not use   Onion salt) Orange Extract-Desserts, baked goods Oregano-Pasta, eggs, cheese, onions, pork, lamb, fish, chicken, vegetables, green salads Paprika-Meat, fish, poultry, eggs, cheese, vegetables Parsley Flakes-Butter, vegetables, meat fish, poultry, eggs, bread, salads (certain forms may   Contain sodium Pepper-Meat fish, poultry, vegetables, eggs Peppermint Extract-Desserts, baked goods Poppy Seed-Eggs, bread, cheese, fruit dressings, baked goods, noodles, vegetables, cottage  [employer], poultry, meat, fish, cauliflower, turnips,eggs bread Saffron-Rice, bread, veal, chicken, fish, eggs Sage-Meat, fish,  poultry, onions, eggplant, tomateos, pork, stews Savory-Eggs, salads, poultry, meat, rice, vegetables, soups, pork Tarragon-Meat, poultry, fish, eggs, butter, vegetables (licorice-like flavor)  Thyme-Meat, poultry, fish, eggs, vegetables, (clover-like flavor), sauces, soups Tumeric-Salads, butter, eggs, fish, rice, vegetables (saffron-like flavor) Vanilla Extract-Baked goods, candy Vinegar-Salads, vegetables, meat marinades Walnut Extract-baked goods, candy  2. Choose your Foods Wisely   The following is a list of foods to avoid which are high in sodium:  Meats-Avoid all smoked, canned, salt cured, dried and kosher meat and fish as well as Anchovies   [employer] meats:Bologna, Liverwurst, Pastrami Canned meat or fish  Marinated herring Caviar    Pepperoni Corned Beef   Pizza Dried chipped beef  Salami Frozen breaded fish or meat Salt pork Frankfurters or hot dogs  Sardines Gefilte fish   Sausage Ham (boiled ham, Proscuitto Smoked butt    spiced ham)   Spam      TV Dinners Vegetables Canned vegetables (Regular) Relish Canned mushrooms  Sauerkraut Olives    Tomato juice Pickles  Bakery and Dessert Products Canned puddings  Cream pies Cheesecake   Decorated cakes Cookies  Beverages/Juices Tomato juice, regular  Gatorade   V-8 vegetable juice, regular  Breads and Cereals Biscuit mixes   Salted potato chips, corn chips, pretzels Bread stuffing mixes  Salted crackers and rolls Pancake and waffle mixes Self-rising flour  Seasonings Accent    Meat sauces Barbecue sauce  Meat tenderizer Catsup    Monosodium glutamate (MSG) Celery salt   Onion salt Chili sauce   Prepared mustard Garlic salt   Salt, seasoned salt, sea salt Gravy mixes   Soy sauce Horseradish   Steak sauce Ketchup   Tartar sauce Lite salt    Teriyaki sauce Marinade mixes   Worcestershire sauce  Others Baking powder   Cocoa  and cocoa mixes Baking soda   Commercial casserole  mixes Candy-caramels, chocolate  Dehydrated soups    Bars, fudge,nougats  Instant rice and pasta mixes Canned broth or soup  Maraschino cherries Cheese, aged and processed cheese and cheese spreads  Learning Assessment Quiz  Indicated T (for True) or F (for False) for each of the following statements:  1. _____ Fresh fruits and vegetables and unprocessed grains are generally low in sodium 2. _____ Water may contain a considerable amount of sodium, depending on the source 3. _____ You can always tell if a food is high in sodium by tasting it 4. _____ Certain laxatives my be high in sodium and should be avoided unless prescribed   by a physician or pharmacist 5. _____ Salt substitutes may be used freely by anyone on a sodium restricted diet 6. _____ Sodium is present in table salt, food additives and as a natural component of   most foods 7. _____ Table salt is approximately 90% sodium 8. _____ Limiting sodium intake may help prevent excess fluid accumulation in the body 9. _____ On a sodium-restricted diet, seasonings such as bouillon soy sauce, and    cooking wine should be used in place of table salt 10. _____ On an ingredient list, a product which lists monosodium glutamate as the first   ingredient is an appropriate food to include on a low sodium diet  Circle the best answer(s) to the following statements (Hint: there may be more than one correct answer)  11. On a low-sodium diet, some acceptable snack items are:    A. Olives  F. Bean dip   K. Grapefruit juice    B. Salted Pretzels G. Commercial Popcorn   L. Canned peaches    C. Carrot Sticks  H. Bouillon   M. Unsalted nuts   D. [place] fries  I. Peanut butter crackers N. Salami   E. Sweet pickles J. Tomato Juice   O. Pizza  12.  Seasonings that may be used freely on a reduced - sodium diet include   A. Lemon wedges F.Monosodium glutamate K. Celery seed    B.Soysauce   G. Pepper   L. Mustard powder   C. Sea salt  H.  Cooking wine  M. Onion flakes   D. Vinegar  E. Prepared horseradish N. Salsa   E. Sage   J. Worcestershire sauce  O. Chutney

## 2017-12-26 NOTE — Discharge Summary (Signed)
Physician Discharge Summary  [name] SWN:[id number] DOB: [birth year] DOA: 11/10/2017  PCP: Leonard Downing, MD  Admit date: 11/10/2017 Discharge date: 11/15/2017   Discharge Diagnoses:  Active Problems:   COPD (chronic obstructive pulmonary disease) (HCC)   Hypertension   Asymptomatic PVCs   PAF (paroxysmal atrial fibrillation) (HCC)   Acute on chronic systolic (congestive) heart failure [facility])   Discharge Condition: Stable   Filed Weights   11/13/17 0341 11/14/17 0432 11/15/17 0152  Weight: 67.7 kg 66.2 kg 66.3 kg   Hospital Course:  84 year old female with history of stroke, paroxysmal A. fib, hypothyroidism, hypertension, GERD, COPD, chronic hypoxic respite failure on home oxygen, chronic systolic CHF with EF 35 to 40%, bladder mass status post surgery, anemia presented on 11/10/2017 with shortness of breath.  BNP was more than 1000.  She was started on IV Lasix and admitted for CHF exacerbation.  11/14/2017: Seen alongside patient's daughter and son.  Discussed extensively with patient and family.  Also discussed with cardiology team.  Cardiology is managing patient diuretics.  Hopefully, patient will be discharged back home in the next 1 to 2 days.  Patient continues to improve.  Seems eager to be discharged back on.  Discussed need to prevent early to a provider when a deterioration is noted.  Patient voiced understanding.  Acute on chronic systolic and diastolic congestive heart failure -2D echo shows EF of 30 to 35% with diffuse hypokinesis and grade 1 diastolic dysfunction -Fluid restriction.  Monitor creatinine -She was treated with IV Lasix.  -2.8 L since admission. Cardiology was consulted and recommend continuing IV Lasix for another day, reassess in a.m. and possibly discharge on oral Lasix. -11/14/2017: Continue IV Lasix for today as per cardiology team.  Likely transition to oral diuretics in a.m.  NSVT Has prior history of same.  Reportedly was on  bisoprolol which was discontinued in 2018 for unclear reasons as per cardiology.  Cardiology was consulted and recommend no further evaluation at this time.  Also given her advanced age, low resting heart rate and that she was asymptomatic with her NSVT, they do not recommend any treatment at this time either.  Hence avoiding restarting beta-blockers.  COPD with chronic hypoxic respiratory failure -Not currently wheezing.  Continue PRN nebs.  Continue Dulera.  Continue oxygen supplementation.  Stable.  Hypertension -Monitor blood pressure.  Continue Lasix.  Controlled.  Paroxysmal atrial fibrillation -Rate controlled.  Continue Eliquis.  Not on any rate controlling medications  Goiter -Monitor.  This is also probably making her breathing difficult at baseline and given her age and debility, she is not a surgical candidate per chart review.  Outpatient follow-up.  Generalized deconditioning -PT eval and recommended home health PT.  Macrocytic anemia Stable.  Periodically follow CBCs.   Consultations:  Cardiology  Discharge Exam: Vitals:   11/15/17 0913 11/15/17 1047  BP:  (!) 120/100  Pulse:    Resp:    Temp:    SpO2: 96%     General: Not in any distress Cardiovascular: S1S2  Discharge Instructions   Discharge Instructions    Diet - low sodium heart healthy   Complete by:  As directed    Increase activity slowly   Complete by:  As directed      Allergies as of 11/15/2017      Reactions   Indomethacin Anaphylaxis, Other (See Comments)   "took it fine for awhile; one day I stopped breathing and I ended up in the hospital" (01/02/2012) Pt has  tolerated aspirin      Medication List    STOP taking these medications   diphenhydramine-acetaminophen 25-500 MG Tabs tablet Commonly known as:  TYLENOL PM     TAKE these medications   acetaminophen 325 MG tablet Commonly known as:  TYLENOL Take 2 tablets (650 mg total) by mouth every 4 (four) hours as needed  for headache or mild pain. What changed:    medication strength  how much to take  when to take this  reasons to take this  additional instructions   albuterol (2.5 MG/3ML) 0.083% nebulizer solution Commonly known as:  PROVENTIL Take 3 mLs (2.5 mg total) by nebulization every 6 (six) hours as needed for wheezing or shortness of breath.   albuterol 108 (90 Base) MCG/ACT inhaler Commonly known as:  PROVENTIL HFA;VENTOLIN HFA Inhale 2 puffs into the lungs every 6 (six) hours as needed for wheezing.   apixaban 5 MG Tabs tablet Commonly known as:  ELIQUIS Take 1 tablet (5 mg total) by mouth 2 (two) times daily.   cetirizine 10 MG tablet Commonly known as:  ZYRTEC Take 10 mg by mouth daily as needed for allergies.   cholecalciferol 1000 units tablet Commonly known as:  VITAMIN D Take 1,000 Units by mouth daily.   docusate sodium 100 MG capsule Commonly known as:  COLACE Take 1 capsule (100 mg total) by mouth 2 (two) times daily. What changed:    when to take this  reasons to take this   ferrous sulfate 325 (65 FE) MG tablet Take 325 mg by mouth 2 (two) times daily with a meal.   fluticasone 50 MCG/ACT nasal spray Commonly known as:  FLONASE Place 1 spray into both nostrils daily as needed for allergies.   furosemide 40 MG tablet Commonly known as:  LASIX Take 1 tablet (40 mg total) by mouth 2 (two) times daily. What changed:  when to take this   mometasone-formoterol 100-5 MCG/ACT Aero Commonly known as:  DULERA Inhale 2 puffs into the lungs 2 (two) times daily.   pantoprazole 40 MG tablet Commonly known as:  PROTONIX Take 1 tablet (40 mg total) by mouth daily.   polyethylene glycol packet Commonly known as:  MIRALAX / GLYCOLAX Take 17 g by mouth daily as needed for mild constipation.   polyvinyl alcohol 1.4 % ophthalmic solution Commonly known as:  LIQUIFILM TEARS Place 1 drop into both eyes as needed for dry eyes.      Allergies  Allergen Reactions   . Indomethacin Anaphylaxis and Other (See Comments)    "took it fine for awhile; one day I stopped breathing and I ended up in the hospital" (01/02/2012) Pt has tolerated aspirin   Follow-up Information    Leonard Downing, MD. Go on 11/26/2017.   Specialty:  Family Medicine Why:  as soon as possible. your MD will decide if you need home health at this appointment@10 :[address] information: [place] [address] [phone]        Nahser, Wonda Cheng, MD. Go on 12/03/2017.   Specialty:  Cardiology Why:  our office will call you with a hospital follow-up visit in 2 weeks @8 :30am Contact information: [address] [place] [address] [phone]            The results of significant diagnostics from this hospitalization (including imaging, microbiology, ancillary and laboratory) are listed below for reference.    Significant Diagnostic Studies: No results found.  Microbiology: No results found  for this or any previous visit (from the past 240 hour(s)).   Labs: Basic Metabolic Panel: No results for input(s): NA, K, CL, CO2, GLUCOSE, BUN, CREATININE, CALCIUM, MG, PHOS in the last 168 hours. Liver Function Tests: No results for input(s): AST, ALT, ALKPHOS, BILITOT, PROT, ALBUMIN in the last 168 hours. No results for input(s): LIPASE, AMYLASE in the last 168 hours. No results for input(s): AMMONIA in the last 168 hours. CBC: No results for input(s): WBC, NEUTROABS, HGB, HCT, MCV, PLT in the last 168 hours. Cardiac Enzymes: No results for input(s): CKTOTAL, CKMB, CKMBINDEX, TROPONINI in the last 168 hours. BNP: BNP (last 3 results) Recent Labs    04/27/17 2038 11/10/17 0500  BNP 953.5* 1,411.4*    ProBNP (last 3 results) No results for input(s): PROBNP in the last 8760 hours.  CBG: No results for input(s): GLUCAP in the last 168 hours.     Signed:  Dana Allan, MD  [facility] Pager #: [phone] 7PM-7AM contact night coverage as above

## 2018-01-02 ENCOUNTER — Other Ambulatory Visit: Payer: Self-pay

## 2018-01-02 ENCOUNTER — Emergency Department (HOSPITAL_COMMUNITY): Payer: Medicare Other

## 2018-01-02 ENCOUNTER — Inpatient Hospital Stay (HOSPITAL_COMMUNITY)
Admission: EM | Admit: 2018-01-02 | Discharge: 2018-01-09 | DRG: 291 | Disposition: A | Discharge status: Home Health | Payer: Medicare Other | Attending: Internal Medicine | Admitting: Internal Medicine

## 2018-01-02 ENCOUNTER — Encounter (HOSPITAL_COMMUNITY): Payer: Self-pay

## 2018-01-02 DIAGNOSIS — I10 Essential (primary) hypertension: Secondary | ICD-10-CM | POA: Diagnosis present

## 2018-01-02 DIAGNOSIS — Z8673 Personal history of transient ischemic attack (TIA), and cerebral infarction without residual deficits: Secondary | ICD-10-CM

## 2018-01-02 DIAGNOSIS — Z9981 Dependence on supplemental oxygen: Secondary | ICD-10-CM

## 2018-01-02 DIAGNOSIS — Z87891 Personal history of nicotine dependence: Secondary | ICD-10-CM

## 2018-01-02 DIAGNOSIS — I480 Paroxysmal atrial fibrillation: Secondary | ICD-10-CM | POA: Diagnosis present

## 2018-01-02 DIAGNOSIS — D72829 Elevated white blood cell count, unspecified: Secondary | ICD-10-CM | POA: Diagnosis present

## 2018-01-02 DIAGNOSIS — I272 Pulmonary hypertension, unspecified: Secondary | ICD-10-CM | POA: Diagnosis present

## 2018-01-02 DIAGNOSIS — I428 Other cardiomyopathies: Secondary | ICD-10-CM | POA: Diagnosis present

## 2018-01-02 DIAGNOSIS — I5023 Acute on chronic systolic (congestive) heart failure: Secondary | ICD-10-CM | POA: Diagnosis present

## 2018-01-02 DIAGNOSIS — Z79899 Other long term (current) drug therapy: Secondary | ICD-10-CM

## 2018-01-02 DIAGNOSIS — Z8639 Personal history of other endocrine, nutritional and metabolic disease: Secondary | ICD-10-CM

## 2018-01-02 DIAGNOSIS — Z888 Allergy status to other drugs, medicaments and biological substances status: Secondary | ICD-10-CM

## 2018-01-02 DIAGNOSIS — Z7951 Long term (current) use of inhaled steroids: Secondary | ICD-10-CM

## 2018-01-02 DIAGNOSIS — I472 Ventricular tachycardia: Secondary | ICD-10-CM | POA: Diagnosis present

## 2018-01-02 DIAGNOSIS — R0982 Postnasal drip: Secondary | ICD-10-CM | POA: Diagnosis present

## 2018-01-02 DIAGNOSIS — R0600 Dyspnea, unspecified: Secondary | ICD-10-CM

## 2018-01-02 DIAGNOSIS — J9621 Acute and chronic respiratory failure with hypoxia: Secondary | ICD-10-CM | POA: Diagnosis present

## 2018-01-02 DIAGNOSIS — I130 Hypertensive heart and chronic kidney disease with heart failure and stage 1 through stage 4 chronic kidney disease, or unspecified chronic kidney disease: Secondary | ICD-10-CM | POA: Diagnosis not present

## 2018-01-02 DIAGNOSIS — Z7901 Long term (current) use of anticoagulants: Secondary | ICD-10-CM

## 2018-01-02 DIAGNOSIS — I447 Left bundle-branch block, unspecified: Secondary | ICD-10-CM | POA: Diagnosis present

## 2018-01-02 DIAGNOSIS — N183 Chronic kidney disease, stage 3 (moderate): Secondary | ICD-10-CM | POA: Diagnosis present

## 2018-01-02 DIAGNOSIS — Z66 Do not resuscitate: Secondary | ICD-10-CM | POA: Diagnosis present

## 2018-01-02 DIAGNOSIS — I509 Heart failure, unspecified: Secondary | ICD-10-CM

## 2018-01-02 DIAGNOSIS — R778 Other specified abnormalities of plasma proteins: Secondary | ICD-10-CM

## 2018-01-02 DIAGNOSIS — R0602 Shortness of breath: Secondary | ICD-10-CM | POA: Diagnosis not present

## 2018-01-02 DIAGNOSIS — I4729 Other ventricular tachycardia: Secondary | ICD-10-CM

## 2018-01-02 DIAGNOSIS — R7989 Other specified abnormal findings of blood chemistry: Secondary | ICD-10-CM | POA: Diagnosis present

## 2018-01-02 DIAGNOSIS — J441 Chronic obstructive pulmonary disease with (acute) exacerbation: Secondary | ICD-10-CM | POA: Diagnosis present

## 2018-01-02 DIAGNOSIS — D631 Anemia in chronic kidney disease: Secondary | ICD-10-CM | POA: Diagnosis present

## 2018-01-02 DIAGNOSIS — K219 Gastro-esophageal reflux disease without esophagitis: Secondary | ICD-10-CM | POA: Diagnosis present

## 2018-01-02 DIAGNOSIS — E039 Hypothyroidism, unspecified: Secondary | ICD-10-CM | POA: Diagnosis present

## 2018-01-02 DIAGNOSIS — I5043 Acute on chronic combined systolic (congestive) and diastolic (congestive) heart failure: Secondary | ICD-10-CM

## 2018-01-02 HISTORY — DX: Chronic kidney disease, stage 3 (moderate): N18.3

## 2018-01-02 HISTORY — DX: Chronic kidney disease, stage 3 unspecified: N18.30

## 2018-01-02 LAB — I-STAT ARTERIAL BLOOD GAS, ED
ACID-BASE EXCESS: 3 mmol/L — AB (ref 0.0–2.0)
Bicarbonate: 28 mmol/L (ref 20.0–28.0)
O2 SAT: 99 %
PH ART: 7.418 (ref 7.350–7.450)
PO2 ART: 135 mmHg — AB (ref 83.0–108.0)
TCO2: 29 mmol/L (ref 22–32)
pCO2 arterial: 43.3 mmHg (ref 32.0–48.0)

## 2018-01-02 MED ORDER — METHYLPREDNISOLONE SODIUM SUCC 125 MG IJ SOLR
125.0000 mg | Freq: Once | INTRAMUSCULAR | Status: AC
  Administered 2018-01-02: 125 mg via INTRAVENOUS
  Filled 2018-01-02: qty 2

## 2018-01-02 MED ORDER — ALBUTEROL (5 MG/ML) CONTINUOUS INHALATION SOLN
10.0000 mg/h | INHALATION_SOLUTION | RESPIRATORY_TRACT | Status: DC
  Administered 2018-01-02: 10 mg/h via RESPIRATORY_TRACT
  Filled 2018-01-02: qty 20

## 2018-01-02 MED ORDER — IPRATROPIUM BROMIDE 0.02 % IN SOLN
0.5000 mg | Freq: Once | RESPIRATORY_TRACT | Status: AC
  Administered 2018-01-02: 0.5 mg via RESPIRATORY_TRACT
  Filled 2018-01-02: qty 2.5

## 2018-01-02 NOTE — ED Provider Notes (Signed)
[facility] EMERGENCY DEPARTMENT Provider Note  CSN: [id number] Arrival date & time: 01/02/18 2324  Chief Complaint(s) Shortness of Breath  HPI [name] is a 85 y.o. female with a history of COPD on 2 L home oxygen, chronic combined systolic/diastolic heart failure recently admitted for CHF exacerbation in November who presents to the emergency department with several days of gradually worsening productive cough and shortness of breath worsened with exertion.  Patient also has bilateral peripheral pitting edema.  Reports that she is compliant with her Lasix.  Called EMS due to the severity of her symptoms.  EMS noted diminished air movement and crackles in the lung bases.  Systolic blood pressures were noted to be in the 200s and patient was satting 92% on her home oxygen.  She was given albuterol nebulizer and sublingual nitro which improved her breathing and shortness of breath.  She denies any associated fevers or chills.  No chest pain.  No nausea vomiting.  No abdominal pain.  HPI  Past Medical History Past Medical History:  Diagnosis Date  . Anemia    years ago  . Arthritis    "right shoulder" (01/02/2012)  . Bladder mass   . CAP (community acquired pneumonia) 01/02/2012   Lower lobe   . Chronic combined systolic and diastolic CHF (congestive heart failure) ([place])   . Chronic respiratory failure ([place])   . COPD (chronic obstructive pulmonary disease) ([place])   . Difficulty sleeping   . Diverticulitis of colon with perforation 01/14/2012   That is post sigmoid resection with [facility] pouch and end colostomy   . GERD (gastroesophageal reflux disease)    occasional  . Goiter    radioactive iodine ablation/notes 01/02/2012  . History of kidney stones    X 1  . Hypertension   . Hypothyroidism    "I have taken Synthroid before" (01/02/2012)   HAS HAD RADIOACTIVE IODINE TX 2 YR S AGO  . Intra-abdominal abscess ([place]) 01/14/2012  . NSVT (nonsustained ventricular  tachycardia) ([place])   . PAF (paroxysmal atrial fibrillation) ([place])   . Pneumatosis of intestines 01/14/2012  . Pneumonia 01/02/2012; ? 2009  . PVC's (premature ventricular contractions)   . Radiation 08/10/13-09/15/13   55 gray to vaginal/bladder mass  . SOB (shortness of breath)    'all the time" (01/02/2012)  . Stroke ([place])   . UTI (lower urinary tract infection) 01/03/2012   Patient Active Problem List   Diagnosis Date Noted  . NSVT (nonsustained ventricular tachycardia) ([place]) 12/09/2017  . Acute on chronic systolic (congestive) heart failure ([place]) 11/10/2017  . CAP (community acquired pneumonia) 04/28/2017  . GERD (gastroesophageal reflux disease) 04/28/2017  . LLQ abdominal pain   . Gait abnormality 05/18/2016  . History of squamous cell carcinoma 03/04/2016  . Chest pain 12/28/2015  . Chronic respiratory failure, unsp w hypoxia or hypercapnia (HCC) 12/15/2015  . PAF (paroxysmal atrial fibrillation) ([place]) 08/27/2015  . Essential hypertension 06/15/2015  . Squamous cell carcinoma, metastatic ([place]) 06/15/2015  . Acute CVA (cerebrovascular accident) ([place]) 04/13/2015  . Cerebrovascular accident (CVA) due to embolism of left middle cerebral artery ([place])   . HLD (hyperlipidemia)   . Cerebral thrombosis with cerebral infarction 04/12/2015  . Stroke-like symptoms   . Dysarthria 04/11/2015  . Absolute anemia 09/04/2014  . Elevated troponin 02/14/2014  . Dyspnea   . Overweight (BMI 25.0-29.9) 02/13/2014  . Goiter 02/13/2014  . Chronic systolic CHF (congestive heart failure) ([place]) 02/13/2014  . Squamous cell carcinoma of unknown origin 07/24/2013  .  S/P colostomy takedown 10/21/2012 10/23/2012  . Diverticulosis of colon 03/07/2012  . Pulmonary nodules 01/23/2012  . Hematuria, microscopic 01/14/2012  . Asymptomatic PVCs 01/03/2012  . H/O: hypothyroidism 01/03/2012  . COPD (chronic obstructive pulmonary disease) ([place]) 01/02/2012  . Hypertension 01/02/2012  . Chronic combined systolic and  diastolic CHF (congestive heart failure) ([place]) 01/02/2012  . Hx of goiter 01/02/2012   Home Medication(s) Prior to Admission medications   Medication Sig Start Date End Date Taking? Authorizing Provider  acetaminophen (TYLENOL) 325 MG tablet Take 2 tablets (650 mg total) by mouth every 4 (four) hours as needed for headache or mild pain. 11/15/17   Bonnell Public, MD  albuterol (PROVENTIL HFA;VENTOLIN HFA) 108 (90 BASE) MCG/ACT inhaler Inhale 2 puffs into the lungs every 6 (six) hours as needed for wheezing.    [provider]  albuterol (PROVENTIL) (2.5 MG/3ML) 0.083% nebulizer solution Take 3 mLs (2.5 mg total) by nebulization every 6 (six) hours as needed for wheezing or shortness of breath. 12/17/15   Elgergawy, Silver Huguenin, MD  apixaban (ELIQUIS) 5 MG TABS tablet Take 1 tablet (5 mg total) by mouth 2 (two) times daily. 08/28/15   Ghimire, Henreitta Leber, MD  cetirizine (ZYRTEC) 10 MG tablet Take 10 mg by mouth daily as needed for allergies.     [provider]  cholecalciferol (VITAMIN D) 1000 units tablet Take 1,000 Units by mouth daily.    [provider]  docusate sodium (COLACE) 100 MG capsule Take 1 capsule (100 mg total) by mouth 2 (two) times daily. Patient taking differently: Take 100 mg by mouth 2 (two) times daily as needed for mild constipation.  05/01/17   Oretha Milch D, MD  ferrous sulfate 325 (65 FE) MG tablet Take 325 mg by mouth 2 (two) times daily with a meal.     [provider]  fluticasone (FLONASE) 50 MCG/ACT nasal spray Place 1 spray into both nostrils daily as needed for allergies.  07/19/17   [provider]  furosemide (LASIX) 40 MG tablet Take 1 tablet (40 mg total) by mouth 2 (two) times daily. 11/15/17   Bonnell Public, MD  mometasone-formoterol (DULERA) 100-5 MCG/ACT AERO Inhale 2 puffs into the lungs 2 (two) times daily. 11/15/17   Dana Allan I, MD  pantoprazole (PROTONIX) 40 MG tablet Take 1 tablet (40 mg total)  by mouth daily. 08/14/17   Fenton Foy, NP  polyethylene glycol (MIRALAX / GLYCOLAX) packet Take 17 g by mouth daily as needed for mild constipation.     [provider]  polyvinyl alcohol (LIQUIFILM TEARS) 1.4 % ophthalmic solution Place 1 drop into both eyes as needed for dry eyes.    [provider]                                                                                                                                    Past Surgical History  Past Surgical History:  Procedure Laterality Date  . ABDOMINAL HYSTERECTOMY  1980's  . APPENDECTOMY  1980's   "when they did hysterectomy" (01/02/2012)  . CATARACT EXTRACTION W/ INTRAOCULAR LENS  IMPLANT, BILATERAL  ?1990's   [place]  . COLON SURGERY    . COLOSTOMY N/A 03/12/2012   Procedure: COLOSTOMY;  Surgeon: Ralene Ok, MD;  Location: [facility];  Service: General;  Laterality: N/A;  . COLOSTOMY REVISION N/A 03/12/2012   Procedure: COLON RESECTION SIGMOID ;  Surgeon: Ralene Ok, MD;  Location: [facility];  Service: General;  Laterality: N/A;  . COLOSTOMY TAKEDOWN N/A 10/21/2012   Procedure: LAPAROSCOPIC COLOSTOMY TAKEDOWN AND HARTMANS ANASTOMOSIS, rigid proctoscopy;  Surgeon: Ralene Ok, MD;  Location: [facility];  Service: General;  Laterality: N/A;  . EYE SURGERY    . LYSIS OF ADHESION N/A 10/21/2012   Procedure: LYSIS OF ADHESION;  Surgeon: Ralene Ok, MD;  Location: [facility];  Service: General;  Laterality: N/A;  . TRANSURETHRAL RESECTION OF BLADDER TUMOR WITH GYRUS (TURBT-GYRUS) N/A 06/30/2013   Procedure: TRANSURETHRAL RESECTION OF BLADDER TUMOR WITH GYRUS (TURBT-GYRUS)/VAGINAL BIOPSY;  Surgeon: Ardis Hughs, MD;  Location: [facility];  Service: Urology;  Laterality: N/A;   Family History Family History  Problem Relation Age of Onset  . Cancer Sister        Breast  . Stroke Sister   . Heart disease Mother   . Heart disease Unknown     Social History Social History   Tobacco Use  . Smoking status:  Former Smoker    Packs/day: 0.12    Years: 35.00    Pack years: 4.20    Types: Cigarettes    Last attempt to quit: 01/02/1991    Years since quitting: 27.0  . Smokeless tobacco: Never Used  Substance Use Topics  . Alcohol use: No  . Drug use: No   Allergies Indomethacin  Review of Systems Review of Systems All other systems are reviewed and are negative for acute change except as noted in the HPI  Physical Exam Vital Signs  I have reviewed the triage vital signs BP (!) 142/87 (BP Location: Right Arm)   Pulse 87   Temp 98 F (36.7 C) (Oral)   Resp 20   Ht 5' (1.524 m)   Wt 69.4 kg   SpO2 95%   BMI 29.88 kg/m   Physical Exam Vitals signs reviewed.  Constitutional:      General: She is not in acute distress.    Appearance: She is well-developed. She is not diaphoretic.  HENT:     Head: Normocephalic and atraumatic.     Nose: Nose normal.  Eyes:     General: No scleral icterus.       Right eye: No discharge.        Left eye: No discharge.     Conjunctiva/sclera: Conjunctivae normal.     Pupils: Pupils are equal, round, and reactive to light.  Neck:     Musculoskeletal: Normal range of motion and neck supple.  Cardiovascular:     Rate and Rhythm: Normal rate and regular rhythm.     Heart sounds: No murmur. No friction rub. No gallop.   Pulmonary:     Effort: Tachypnea, accessory muscle usage and respiratory distress present.     Breath sounds: No stridor. Examination of the right-lower field reveals rales. Rales present.  Abdominal:     General: There is no distension.     Palpations: Abdomen is soft.  Tenderness: There is no abdominal tenderness.  Musculoskeletal:        General: No tenderness.  Skin:    General: Skin is warm and dry.     Findings: No erythema or rash.  Neurological:     Mental Status: She is alert and oriented to person, place, and time.     ED Results and Treatments Labs (all labs ordered are listed, but only abnormal results are  displayed) Labs Reviewed  CBC WITH DIFFERENTIAL/PLATELET - Abnormal; Notable for the following components:      Result Value   RBC 2.97 (*)    Hemoglobin 11.0 (*)    HCT 30.2 (*)    MCV 101.7 (*)    MCH 37.0 (*)    MCHC 36.4 (*)    All other components within normal limits  COMPREHENSIVE METABOLIC PANEL - Abnormal; Notable for the following components:   Glucose, Bld 127 (*)    Creatinine, Ser 1.05 (*)    Albumin 3.4 (*)    AST 65 (*)    ALT 45 (*)    Total Bilirubin 1.4 (*)    GFR calc non Af Amer 47 (*)    GFR calc Af Amer 54 (*)    All other components within normal limits  I-STAT ARTERIAL BLOOD GAS, ED - Abnormal; Notable for the following components:   pO2, Arterial 135.0 (*)    Acid-Base Excess 3.0 (*)    All other components within normal limits  I-STAT CHEM 8, ED - Abnormal; Notable for the following components:   BUN 26 (*)    Glucose, Bld 121 (*)    Calcium, Ion 1.12 (*)    Hemoglobin 11.9 (*)    HCT 35.0 (*)    All other components within normal limits  I-STAT TROPONIN, ED - Abnormal; Notable for the following components:   Troponin i, poc 0.22 (*)    All other components within normal limits  BLOOD GAS, ARTERIAL  BRAIN NATRIURETIC PEPTIDE                                                                                                                         EKG  EKG Interpretation  Date/Time:  Thursday January 02 2018 23:37:48 EST Ventricular Rate:  88 PR Interval:    QRS Duration: 152 QT Interval:  390 QTC Calculation: 472 R Axis:   -67 Text Interpretation:  Sinus rhythm Ventricular premature complex Left bundle branch block No significant change since last tracing Confirmed by Addison Lank [phone]) on 01/03/2018 12:28:14 AM      Radiology Dg Chest Port 1 View  Result Date: 01/03/2018 CLINICAL DATA:  Acute onset of shortness of breath. EXAM: PORTABLE CHEST 1 VIEW COMPARISON:  Chest radiograph performed 11/10/2017 FINDINGS: The lungs are well-aerated.  Vascular congestion is noted. Increased interstitial markings may reflect mild interstitial edema. There is no evidence of pleural effusion or pneumothorax. The cardiomediastinal silhouette is mildly enlarged. No acute osseous abnormalities are seen. Degenerative change  is noted at the glenohumeral joints bilaterally, more prominent on the right. IMPRESSION: Vascular congestion and mild cardiomegaly. Increased interstitial markings may reflect mild interstitial edema. Electronically Signed   By: Garald Balding M.D.   On: 01/03/2018 00:22   Pertinent labs & imaging results that were available during my care of the patient were reviewed by me and considered in my medical decision making (see chart for details).  Medications Ordered in ED Medications  albuterol (PROVENTIL,VENTOLIN) solution continuous neb (10 mg/hr Nebulization New Bag/Given 01/02/18 2346)  furosemide (LASIX) injection 80 mg (has no administration in time range)  methylPREDNISolone sodium succinate (SOLU-MEDROL) 125 mg/2 mL injection 125 mg (125 mg Intravenous Given 01/02/18 2358)  ipratropium (ATROVENT) nebulizer solution 0.5 mg (0.5 mg Nebulization Given 01/02/18 2346)                                                                                                                                    Procedures Procedures CRITICAL CARE Performed by: Grayce Sessions Raena Pau Total critical care time: 45 minutes Critical care time was exclusive of separately billable procedures and treating other patients. Critical care was necessary to treat or prevent imminent or life-threatening deterioration. Critical care was time spent personally by me on the following activities: development of treatment plan with patient and/or surrogate as well as nursing, discussions with consultants, evaluation of patient's response to treatment, examination of patient, obtaining history from patient or surrogate, ordering and performing treatments and interventions,  ordering and review of laboratory studies, ordering and review of radiographic studies, pulse oximetry and re-evaluation of patient's condition.   (including critical care time)  Medical Decision Making / ED Course I have reviewed the nursing notes for this encounter and the patient's prior records (if available in EHR or on provided paperwork).    Patient presents with respiratory distress likely combined CHF/COPD exacerbation.  Also possibility for hypertensive emergency. will obtain screening labs to assess for any superimposed pneumonia.   Chest x-ray with diffuse pulmonary edema and not suggestive of pneumonia.  No leukocytosis on CBC.  ABG reassuring.  EKG without acute ischemic changes or evidence of pericarditis.  Troponin elevated at 0.22.  Likely demand ischemia from hypertensive emergency with CHF exacerbation. BP controlled following SL NTG.  Case discussed with cardiology.  They requested medicine admission. Consult if needed.  Final Clinical Impression(s) / ED Diagnoses Final diagnoses:  SOB (shortness of breath)  Acute on chronic combined systolic and diastolic congestive heart failure (HCC)  Elevated troponin      This chart was dictated using voice recognition software.  Despite best efforts to proofread,  errors can occur which can change the documentation meaning.   Fatima Blank, MD 01/03/18 [phone]

## 2018-01-02 NOTE — ED Triage Notes (Signed)
Arrived from home with SOB.  Patient with congestion in lower bases with decreased bilaterally.  Pre hospital albuterol with improvement.

## 2018-01-03 ENCOUNTER — Encounter (HOSPITAL_COMMUNITY): Payer: Self-pay | Admitting: Internal Medicine

## 2018-01-03 DIAGNOSIS — Z888 Allergy status to other drugs, medicaments and biological substances status: Secondary | ICD-10-CM | POA: Diagnosis not present

## 2018-01-03 DIAGNOSIS — I272 Pulmonary hypertension, unspecified: Secondary | ICD-10-CM | POA: Diagnosis present

## 2018-01-03 DIAGNOSIS — I480 Paroxysmal atrial fibrillation: Secondary | ICD-10-CM

## 2018-01-03 DIAGNOSIS — Z8673 Personal history of transient ischemic attack (TIA), and cerebral infarction without residual deficits: Secondary | ICD-10-CM | POA: Diagnosis not present

## 2018-01-03 DIAGNOSIS — J9621 Acute and chronic respiratory failure with hypoxia: Secondary | ICD-10-CM | POA: Diagnosis present

## 2018-01-03 DIAGNOSIS — I5023 Acute on chronic systolic (congestive) heart failure: Secondary | ICD-10-CM

## 2018-01-03 DIAGNOSIS — R7989 Other specified abnormal findings of blood chemistry: Secondary | ICD-10-CM

## 2018-01-03 DIAGNOSIS — I428 Other cardiomyopathies: Secondary | ICD-10-CM | POA: Diagnosis present

## 2018-01-03 DIAGNOSIS — Z7901 Long term (current) use of anticoagulants: Secondary | ICD-10-CM | POA: Diagnosis not present

## 2018-01-03 DIAGNOSIS — Z66 Do not resuscitate: Secondary | ICD-10-CM | POA: Diagnosis present

## 2018-01-03 DIAGNOSIS — D72829 Elevated white blood cell count, unspecified: Secondary | ICD-10-CM | POA: Diagnosis present

## 2018-01-03 DIAGNOSIS — E039 Hypothyroidism, unspecified: Secondary | ICD-10-CM | POA: Diagnosis present

## 2018-01-03 DIAGNOSIS — I447 Left bundle-branch block, unspecified: Secondary | ICD-10-CM | POA: Diagnosis present

## 2018-01-03 DIAGNOSIS — R0602 Shortness of breath: Secondary | ICD-10-CM | POA: Diagnosis present

## 2018-01-03 DIAGNOSIS — I130 Hypertensive heart and chronic kidney disease with heart failure and stage 1 through stage 4 chronic kidney disease, or unspecified chronic kidney disease: Secondary | ICD-10-CM | POA: Diagnosis present

## 2018-01-03 DIAGNOSIS — I509 Heart failure, unspecified: Secondary | ICD-10-CM

## 2018-01-03 DIAGNOSIS — I10 Essential (primary) hypertension: Secondary | ICD-10-CM | POA: Diagnosis not present

## 2018-01-03 DIAGNOSIS — I5043 Acute on chronic combined systolic (congestive) and diastolic (congestive) heart failure: Secondary | ICD-10-CM | POA: Diagnosis not present

## 2018-01-03 DIAGNOSIS — Z8639 Personal history of other endocrine, nutritional and metabolic disease: Secondary | ICD-10-CM | POA: Diagnosis not present

## 2018-01-03 DIAGNOSIS — N183 Chronic kidney disease, stage 3 (moderate): Secondary | ICD-10-CM | POA: Diagnosis present

## 2018-01-03 DIAGNOSIS — Z87891 Personal history of nicotine dependence: Secondary | ICD-10-CM | POA: Diagnosis not present

## 2018-01-03 DIAGNOSIS — R0982 Postnasal drip: Secondary | ICD-10-CM | POA: Diagnosis present

## 2018-01-03 DIAGNOSIS — J441 Chronic obstructive pulmonary disease with (acute) exacerbation: Secondary | ICD-10-CM | POA: Diagnosis not present

## 2018-01-03 DIAGNOSIS — I5041 Acute combined systolic (congestive) and diastolic (congestive) heart failure: Secondary | ICD-10-CM | POA: Diagnosis not present

## 2018-01-03 DIAGNOSIS — Z79899 Other long term (current) drug therapy: Secondary | ICD-10-CM | POA: Diagnosis not present

## 2018-01-03 DIAGNOSIS — Z7951 Long term (current) use of inhaled steroids: Secondary | ICD-10-CM | POA: Diagnosis not present

## 2018-01-03 DIAGNOSIS — I472 Ventricular tachycardia: Secondary | ICD-10-CM | POA: Diagnosis present

## 2018-01-03 DIAGNOSIS — Z9981 Dependence on supplemental oxygen: Secondary | ICD-10-CM | POA: Diagnosis not present

## 2018-01-03 DIAGNOSIS — D631 Anemia in chronic kidney disease: Secondary | ICD-10-CM | POA: Diagnosis present

## 2018-01-03 DIAGNOSIS — K219 Gastro-esophageal reflux disease without esophagitis: Secondary | ICD-10-CM | POA: Diagnosis present

## 2018-01-03 LAB — CBC WITH DIFFERENTIAL/PLATELET
ABS IMMATURE GRANULOCYTES: 0.04 10*3/uL (ref 0.00–0.07)
Basophils Absolute: 0 10*3/uL (ref 0.0–0.1)
Basophils Relative: 0 %
EOS PCT: 1 %
Eosinophils Absolute: 0.1 10*3/uL (ref 0.0–0.5)
HEMATOCRIT: 30.2 % — AB (ref 36.0–46.0)
HEMOGLOBIN: 11 g/dL — AB (ref 12.0–15.0)
Immature Granulocytes: 0 %
LYMPHS PCT: 11 %
Lymphs Abs: 1.1 10*3/uL (ref 0.7–4.0)
MCH: 37 pg — AB (ref 26.0–34.0)
MCHC: 36.4 g/dL — AB (ref 30.0–36.0)
MCV: 101.7 fL — ABNORMAL HIGH (ref 80.0–100.0)
MONO ABS: 0.9 10*3/uL (ref 0.1–1.0)
Monocytes Relative: 10 %
NEUTROS ABS: 7.4 10*3/uL (ref 1.7–7.7)
Neutrophils Relative %: 78 %
Platelets: 200 10*3/uL (ref 150–400)
RBC: 2.97 MIL/uL — ABNORMAL LOW (ref 3.87–5.11)
RDW: 13.1 % (ref 11.5–15.5)
WBC: 9.5 10*3/uL (ref 4.0–10.5)
nRBC: 0 % (ref 0.0–0.2)

## 2018-01-03 LAB — COMPREHENSIVE METABOLIC PANEL
ALK PHOS: 90 U/L (ref 38–126)
ALT: 45 U/L — AB (ref 0–44)
AST: 65 U/L — AB (ref 15–41)
Albumin: 3.4 g/dL — ABNORMAL LOW (ref 3.5–5.0)
Anion gap: 11 (ref 5–15)
BILIRUBIN TOTAL: 1.4 mg/dL — AB (ref 0.3–1.2)
BUN: 17 mg/dL (ref 8–23)
CALCIUM: 8.9 mg/dL (ref 8.9–10.3)
CHLORIDE: 102 mmol/L (ref 98–111)
CO2: 27 mmol/L (ref 22–32)
CREATININE: 1.05 mg/dL — AB (ref 0.44–1.00)
GFR, EST AFRICAN AMERICAN: 54 mL/min — AB (ref >60)
GFR, EST NON AFRICAN AMERICAN: 47 mL/min — AB (ref >60)
Glucose, Bld: 127 mg/dL — ABNORMAL HIGH (ref 70–99)
Potassium: 4 mmol/L (ref 3.5–5.1)
Sodium: 140 mmol/L (ref 135–145)
Total Protein: 6.6 g/dL (ref 6.5–8.1)

## 2018-01-03 LAB — MAGNESIUM: MAGNESIUM: 1.9 mg/dL (ref 1.7–2.4)

## 2018-01-03 LAB — I-STAT CHEM 8, ED
BUN: 26 mg/dL — ABNORMAL HIGH (ref 8–23)
CHLORIDE: 102 mmol/L (ref 98–111)
CREATININE: 0.9 mg/dL (ref 0.44–1.00)
Calcium, Ion: 1.12 mmol/L — ABNORMAL LOW (ref 1.15–1.40)
GLUCOSE: 121 mg/dL — AB (ref 70–99)
HCT: 35 % — ABNORMAL LOW (ref 36.0–46.0)
Hemoglobin: 11.9 g/dL — ABNORMAL LOW (ref 12.0–15.0)
POTASSIUM: 4.1 mmol/L (ref 3.5–5.1)
Sodium: 139 mmol/L (ref 135–145)
TCO2: 31 mmol/L (ref 22–32)

## 2018-01-03 LAB — CBC
HCT: 29.7 % — ABNORMAL LOW (ref 36.0–46.0)
Hemoglobin: 10.4 g/dL — ABNORMAL LOW (ref 12.0–15.0)
MCH: 34.7 pg — ABNORMAL HIGH (ref 26.0–34.0)
MCHC: 35 g/dL (ref 30.0–36.0)
MCV: 99 fL (ref 80.0–100.0)
Platelets: 181 10*3/uL (ref 150–400)
RBC: 3 MIL/uL — ABNORMAL LOW (ref 3.87–5.11)
RDW: 12.7 % (ref 11.5–15.5)
WBC: 9.4 10*3/uL (ref 4.0–10.5)
nRBC: 0 % (ref 0.0–0.2)

## 2018-01-03 LAB — BASIC METABOLIC PANEL
Anion gap: 12 (ref 5–15)
BUN: 21 mg/dL (ref 8–23)
CO2: 24 mmol/L (ref 22–32)
CREATININE: 1.16 mg/dL — AB (ref 0.44–1.00)
Calcium: 8.7 mg/dL — ABNORMAL LOW (ref 8.9–10.3)
Chloride: 104 mmol/L (ref 98–111)
GFR calc Af Amer: 48 mL/min — ABNORMAL LOW (ref >60)
GFR calc non Af Amer: 41 mL/min — ABNORMAL LOW (ref >60)
Glucose, Bld: 196 mg/dL — ABNORMAL HIGH (ref 70–99)
Potassium: 3.5 mmol/L (ref 3.5–5.1)
Sodium: 140 mmol/L (ref 135–145)

## 2018-01-03 LAB — TROPONIN I
Troponin I: 0.23 ng/mL (ref <0.03)
Troponin I: 0.22 ng/mL (ref <0.03)
Troponin I: 0.28 ng/mL (ref <0.03)

## 2018-01-03 LAB — I-STAT TROPONIN, ED: Troponin i, poc: 0.22 ng/mL (ref 0.00–0.08)

## 2018-01-03 LAB — BRAIN NATRIURETIC PEPTIDE: B Natriuretic Peptide: 1380.6 pg/mL — ABNORMAL HIGH (ref 0.0–100.0)

## 2018-01-03 MED ORDER — PREDNISONE 20 MG PO TABS
40.0000 mg | ORAL_TABLET | Freq: Every day | ORAL | Status: DC
  Administered 2018-01-04 – 2018-01-05 (×2): 40 mg via ORAL
  Filled 2018-01-03 (×2): qty 2

## 2018-01-03 MED ORDER — POTASSIUM CHLORIDE CRYS ER 20 MEQ PO TBCR
20.0000 meq | EXTENDED_RELEASE_TABLET | Freq: Once | ORAL | Status: AC
  Administered 2018-01-03: 20 meq via ORAL
  Filled 2018-01-03: qty 1

## 2018-01-03 MED ORDER — ACETAMINOPHEN 325 MG PO TABS
650.0000 mg | ORAL_TABLET | Freq: Four times a day (QID) | ORAL | Status: DC | PRN
  Administered 2018-01-05: 650 mg via ORAL
  Filled 2018-01-03: qty 2

## 2018-01-03 MED ORDER — FUROSEMIDE 10 MG/ML IJ SOLN
60.0000 mg | Freq: Two times a day (BID) | INTRAMUSCULAR | Status: DC
  Administered 2018-01-03 – 2018-01-05 (×6): 60 mg via INTRAVENOUS
  Filled 2018-01-03 (×7): qty 6

## 2018-01-03 MED ORDER — ONDANSETRON HCL 4 MG/2ML IJ SOLN: 4.0000 mg | Freq: Four times a day (QID) | INTRAMUSCULAR | Status: DC | PRN

## 2018-01-03 MED ORDER — ALBUTEROL SULFATE (2.5 MG/3ML) 0.083% IN NEBU
2.5000 mg | INHALATION_SOLUTION | RESPIRATORY_TRACT | Status: DC
  Administered 2018-01-03 (×5): 2.5 mg via RESPIRATORY_TRACT
  Filled 2018-01-03 (×5): qty 3

## 2018-01-03 MED ORDER — LIVING BETTER WITH HEART FAILURE BOOK
Freq: Once | Status: AC
  Administered 2018-01-03: 18:00:00

## 2018-01-03 MED ORDER — MOMETASONE FURO-FORMOTEROL FUM 100-5 MCG/ACT IN AERO
2.0000 | INHALATION_SPRAY | Freq: Two times a day (BID) | RESPIRATORY_TRACT | Status: DC
  Administered 2018-01-03 – 2018-01-09 (×12): 2 via RESPIRATORY_TRACT
  Filled 2018-01-03: qty 8.8

## 2018-01-03 MED ORDER — ALBUTEROL SULFATE (2.5 MG/3ML) 0.083% IN NEBU
2.5000 mg | INHALATION_SOLUTION | Freq: Three times a day (TID) | RESPIRATORY_TRACT | Status: DC
  Administered 2018-01-04: 2.5 mg via RESPIRATORY_TRACT
  Filled 2018-01-03: qty 3

## 2018-01-03 MED ORDER — POLYETHYLENE GLYCOL 3350 17 G PO PACK
17.0000 g | PACK | Freq: Every day | ORAL | Status: DC | PRN
  Administered 2018-01-03 – 2018-01-07 (×3): 17 g via ORAL
  Filled 2018-01-03 (×3): qty 1

## 2018-01-03 MED ORDER — ONDANSETRON HCL 4 MG PO TABS: 4.0000 mg | ORAL_TABLET | Freq: Four times a day (QID) | ORAL | Status: DC | PRN

## 2018-01-03 MED ORDER — ORAL CARE MOUTH RINSE
15.0000 mL | Freq: Two times a day (BID) | OROMUCOSAL | Status: DC
  Administered 2018-01-03 – 2018-01-09 (×7): 15 mL via OROMUCOSAL

## 2018-01-03 MED ORDER — FERROUS SULFATE 325 (65 FE) MG PO TABS
325.0000 mg | ORAL_TABLET | Freq: Two times a day (BID) | ORAL | Status: DC
  Administered 2018-01-03 – 2018-01-09 (×13): 325 mg via ORAL
  Filled 2018-01-03 (×13): qty 1

## 2018-01-03 MED ORDER — FLUTICASONE PROPIONATE 50 MCG/ACT NA SUSP
1.0000 | Freq: Every day | NASAL | Status: DC | PRN
  Administered 2018-01-07: 1 via NASAL
  Filled 2018-01-03: qty 16

## 2018-01-03 MED ORDER — ALBUTEROL SULFATE (2.5 MG/3ML) 0.083% IN NEBU: 2.5000 mg | INHALATION_SOLUTION | RESPIRATORY_TRACT | Status: DC | PRN

## 2018-01-03 MED ORDER — DOCUSATE SODIUM 100 MG PO CAPS
100.0000 mg | ORAL_CAPSULE | Freq: Two times a day (BID) | ORAL | Status: DC | PRN
  Filled 2018-01-03: qty 1

## 2018-01-03 MED ORDER — FUROSEMIDE 10 MG/ML IJ SOLN
80.0000 mg | Freq: Once | INTRAMUSCULAR | Status: AC
  Administered 2018-01-03: 80 mg via INTRAVENOUS
  Filled 2018-01-03: qty 8

## 2018-01-03 MED ORDER — APIXABAN 5 MG PO TABS
5.0000 mg | ORAL_TABLET | Freq: Two times a day (BID) | ORAL | Status: DC
  Administered 2018-01-03 – 2018-01-09 (×13): 5 mg via ORAL
  Filled 2018-01-03 (×14): qty 1

## 2018-01-03 MED ORDER — ACETAMINOPHEN 650 MG RE SUPP: 650.0000 mg | Freq: Four times a day (QID) | RECTAL | Status: DC | PRN

## 2018-01-03 MED ORDER — PANTOPRAZOLE SODIUM 40 MG PO TBEC
40.0000 mg | DELAYED_RELEASE_TABLET | Freq: Every day | ORAL | Status: DC
  Administered 2018-01-03 – 2018-01-09 (×7): 40 mg via ORAL
  Filled 2018-01-03 (×7): qty 1

## 2018-01-03 NOTE — ED Notes (Signed)
Breakfast Tray ordered  

## 2018-01-03 NOTE — Progress Notes (Signed)
Living Better with Heart Failure book given and reviewed with patient. Patient verbalizes understanding.

## 2018-01-03 NOTE — Progress Notes (Signed)
[name] is a 85 y.o. female with history of chronic respiratory failure with COPD on home oxygen, chronic combined systolic and diastolic CHF last EF measured was around 35% with grade 1 diastolic dysfunction, chronic kidney disease stage III, chronic anemia was recently admitted a month and a half ago for CHF and COPD presents to the ER because of worsening shortness of breath even at rest for last 1 week.  Patient appeared volume overloaded was given Lasix 80 mg IV and some nebulizer treatment for wheezing admitted for respiratory failure likely a combination of CHF and COPD.   Plan: Continue with lasix, duonebs and prednisone.   Hosie Poisson, MD [phone]

## 2018-01-03 NOTE — Plan of Care (Signed)
?  Problem: Activity: ?Goal: Risk for activity intolerance will decrease ?Outcome: Progressing ?  ?Problem: Nutrition: ?Goal: Adequate nutrition will be maintained ?Outcome: Progressing ?  ?Problem: Elimination: ?Goal: Will not experience complications related to urinary retention ?Outcome: Progressing ?  ?Problem: Pain Managment: ?Goal: General experience of comfort will improve ?Outcome: Progressing ?  ?Problem: Safety: ?Goal: Ability to remain free from injury will improve ?Outcome: Progressing ?  ?Problem: Skin Integrity: ?Goal: Risk for impaired skin integrity will decrease ?Outcome: Progressing ?  ?

## 2018-01-03 NOTE — Consult Note (Addendum)
Cardiology Consultation:   Patient ID: [name]; [id number]; April 05, 1925   Admit date: 01/02/2018 Date of Consult: 01/03/2018  Primary Care Provider: Leonard Downing, MD Primary Cardiologist: Mertie Moores, MD Primary Electrophysiologist:  None  Chief Complaint: SOB  Patient Profile:   [name] is a 85 y.o. female with a hx of chronic combined CHF, PAF, chronic anticoagulation, NSVT, thyroid goiter compressing pharynx, PVCs, CVA, CKD III by labs, COPD with chronic respiratory failure on chronic home O2 (2L/min baseline), remote h/o perforated bowel treated with emergency surgery (subsequent reversal of colostomy), hypothyroidism who is being seen today for the evaluation of CHF at the request of Dr. Karleen Hampshire.  History of Present Illness:   She has a long history of cardiomyopathy. She has not had prior cath. Nuclear stress test in 2009 showed no ischemia but abnormal due to reduced LVF. Her EF was 40-45% in 2014. In April 2017, she was hospitalized for a stroke. Initial 30 day monitor showed no Afib, but she was found to have atrial fib in November 2017 during a hospitalization for COPD. Plavix was discontinued and she was started on Eliquis. Cardiology was previously consulted for NSVT - she was seen by Dr. Tamala Julian who recommended conservative management with beta blocker. Bisoprolol was added due to her pulmonary issues (COPD). The bisoprolol was discontinued in 2018 but reason for discontinuation was not outlined in chart, presumed possibly bradycardia. More recently she was admitted 11/2017 with heart failure and EF decline to 30-35% with diffuse hypokinesis mild MR, mild pulm HTN. She was diuresed with Lasix and discharged on 40mg  BID. Cardiology also followed for NSVT who felt that given her advanced age, borderline bradycardia and lack of symptoms, no further intervention was advised. She was in NSR with PVCs otherwise. DC weight was 66.3kg. She was seen in follow-up 12/10/17 and  weighed 68.1kg but appeared compensated. Med regimen has been limited by low-normal BP in the hospital.  She was readmitted with worsening SOB and orthopnea for the last week. She had not noticed any abrupt weight change but now in retrospect acknowledges a gradual change from 11/2017 discharge (66.3kg in 11/2017, now 69.4kg). She has been compliant with sodium restriction. She does occasionally skip diuretic doses if she is out and about but in general is compliant with meds. She also reports occasional yellow sputum productive cough and sensation of L sided back discomfort. She thought she might have PNA. She's not had any chest pain or palpitations. No LEE on exam today. BNP 1380, troponins 0.22-0.23-0.22-0.28, Cr ranging 0.9-1.16 AST/ALT mildly elevated, albumin 3.4. CXR showed vascular congestion and mild cardiomegaly, increased interstitial markings that may reflect mild interstitial edema. She is afebrile and being diuresed with IV Lasix 60mg  BID. She has not yet been weighed today but I have requested this. I/O's do not appear complete. She does report she feels a whole lot better since admission but not yet back to baseline. She is also being treated with steroids.    Past Medical History:  Diagnosis Date  . Anemia    years ago  . Arthritis    "right shoulder" (01/02/2012)  . Bladder mass   . CAP (community acquired pneumonia) 01/02/2012   Lower lobe   . Chronic combined systolic and diastolic CHF (congestive heart failure) ([place])   . Chronic respiratory failure ([place])   . CKD (chronic kidney disease), stage III ([place])    by labs  . COPD (chronic obstructive pulmonary disease) ([place])   . Difficulty  sleeping   . Diverticulitis of colon with perforation 01/14/2012   That is post sigmoid resection with [facility] pouch and end colostomy   . GERD (gastroesophageal reflux disease)    occasional  . Goiter    radioactive iodine ablation/notes 01/02/2012 - large compressing pharynx  . History of kidney  stones    X 1  . Hypertension   . Hypothyroidism    "I have taken Synthroid before" (01/02/2012)   HAS HAD RADIOACTIVE IODINE TX 2 YR S AGO  . Intra-abdominal abscess ([place]) 01/14/2012  . NSVT (nonsustained ventricular tachycardia) ([place])   . PAF (paroxysmal atrial fibrillation) ([place])   . Pneumatosis of intestines 01/14/2012  . Pneumonia 01/02/2012; ? 2009  . PVC's (premature ventricular contractions)   . Radiation 08/10/13-09/15/13   57 gray to vaginal/bladder mass  . SOB (shortness of breath)    'all the time" (01/02/2012)  . Stroke ([place])   . UTI (lower urinary tract infection) 01/03/2012    Past Surgical History:  Procedure Laterality Date  . ABDOMINAL HYSTERECTOMY  1980's  . APPENDECTOMY  1980's   "when they did hysterectomy" (01/02/2012)  . CATARACT EXTRACTION W/ INTRAOCULAR LENS  IMPLANT, BILATERAL  ?1990's   [place]  . COLON SURGERY    . COLOSTOMY N/A 03/12/2012   Procedure: COLOSTOMY;  Surgeon: Ralene Ok, MD;  Location: [facility];  Service: General;  Laterality: N/A;  . COLOSTOMY REVISION N/A 03/12/2012   Procedure: COLON RESECTION SIGMOID ;  Surgeon: Ralene Ok, MD;  Location: [facility];  Service: General;  Laterality: N/A;  . COLOSTOMY TAKEDOWN N/A 10/21/2012   Procedure: LAPAROSCOPIC COLOSTOMY TAKEDOWN AND HARTMANS ANASTOMOSIS, rigid proctoscopy;  Surgeon: Ralene Ok, MD;  Location: [facility];  Service: General;  Laterality: N/A;  . EYE SURGERY    . LYSIS OF ADHESION N/A 10/21/2012   Procedure: LYSIS OF ADHESION;  Surgeon: Ralene Ok, MD;  Location: [facility];  Service: General;  Laterality: N/A;  . TRANSURETHRAL RESECTION OF BLADDER TUMOR WITH GYRUS (TURBT-GYRUS) N/A 06/30/2013   Procedure: TRANSURETHRAL RESECTION OF BLADDER TUMOR WITH GYRUS (TURBT-GYRUS)/VAGINAL BIOPSY;  Surgeon: Ardis Hughs, MD;  Location: [facility];  Service: Urology;  Laterality: N/A;     Inpatient Medications: Scheduled Meds: . albuterol  2.5 mg Nebulization Q4H  . apixaban  5 mg Oral BID  . ferrous  sulfate  325 mg Oral BID WC  . furosemide  60 mg Intravenous Q12H  . Living Better with Heart Failure Book   Does not apply Once  . mouth rinse  15 mL Mouth Rinse BID  . mometasone-formoterol  2 puff Inhalation BID  . pantoprazole  40 mg Oral Daily  . [START ON 01/04/2018] predniSONE  40 mg Oral QAC breakfast   Continuous Infusions:  PRN Meds: acetaminophen **OR** acetaminophen, albuterol, docusate sodium, fluticasone, ondansetron **OR** ondansetron (ZOFRAN) IV, polyethylene glycol  Home Meds: Prior to Admission medications   Medication Sig Start Date End Date Taking? Authorizing Provider  acetaminophen (TYLENOL) 325 MG tablet Take 2 tablets (650 mg total) by mouth every 4 (four) hours as needed for headache or mild pain. 11/15/17  Yes Bonnell Public, MD  albuterol (PROVENTIL HFA;VENTOLIN HFA) 108 (90 BASE) MCG/ACT inhaler Inhale 2 puffs into the lungs every 6 (six) hours as needed for wheezing.   Yes [provider]  albuterol (PROVENTIL) (2.5 MG/3ML) 0.083% nebulizer solution Take 3 mLs (2.5 mg total) by nebulization every 6 (six) hours as needed for wheezing or shortness of breath. 12/17/15  Yes Elgergawy, Emeline Gins  S, MD  apixaban (ELIQUIS) 5 MG TABS tablet Take 1 tablet (5 mg total) by mouth 2 (two) times daily. 08/28/15  Yes Ghimire, Henreitta Leber, MD  cetirizine (ZYRTEC) 10 MG tablet Take 10 mg by mouth daily as needed for allergies.    Yes [provider]  cholecalciferol (VITAMIN D) 1000 units tablet Take 1,000 Units by mouth daily.   Yes [provider]  docusate sodium (COLACE) 100 MG capsule Take 1 capsule (100 mg total) by mouth 2 (two) times daily. Patient taking differently: Take 100 mg by mouth 2 (two) times daily as needed for mild constipation.  05/01/17  Yes Oretha Milch D, MD  ferrous sulfate 325 (65 FE) MG tablet Take 325 mg by mouth 2 (two) times daily with a meal.    Yes [provider]  fluticasone (FLONASE) 50 MCG/ACT nasal spray Place  1 spray into both nostrils daily as needed for allergies.  07/19/17  Yes [provider]  furosemide (LASIX) 40 MG tablet Take 1 tablet (40 mg total) by mouth 2 (two) times daily. 11/15/17  Yes Bonnell Public, MD  mometasone-formoterol (DULERA) 100-5 MCG/ACT AERO Inhale 2 puffs into the lungs 2 (two) times daily. 11/15/17  Yes Dana Allan I, MD  pantoprazole (PROTONIX) 40 MG tablet Take 1 tablet (40 mg total) by mouth daily. 08/14/17  Yes Fenton Foy, NP  polyethylene glycol (MIRALAX / GLYCOLAX) packet Take 17 g by mouth daily as needed for mild constipation.    Yes [provider]  polyvinyl alcohol (LIQUIFILM TEARS) 1.4 % ophthalmic solution Place 1 drop into both eyes as needed for dry eyes.   Yes [provider]    Allergies:    Allergies  Allergen Reactions  . Indomethacin Anaphylaxis and Other (See Comments)    "took it fine for awhile; one day I stopped breathing and I ended up in the hospital" (01/02/2012) Pt has tolerated aspirin    Social History:   Social History   Socioeconomic History  . Marital status: Widowed    Spouse name: Not on file  . Number of children: 6  . Years of education: Not on file  . Highest education level: Not on file  Occupational History  . Occupation: retired  Scientific laboratory technician  . Financial resource strain: Not on file  . Food insecurity:    Worry: Not on file    Inability: Not on file  . Transportation needs:    Medical: Not on file    Non-medical: Not on file  Tobacco Use  . Smoking status: Former Smoker    Packs/day: 0.12    Years: 35.00    Pack years: 4.20    Types: Cigarettes    Last attempt to quit: 01/02/1991    Years since quitting: 27.0  . Smokeless tobacco: Never Used  Substance and Sexual Activity  . Alcohol use: No  . Drug use: No  . Sexual activity: Never  Lifestyle  . Physical activity:    Days per week: Not on file    Minutes per session: Not on file  . Stress: Not on file    Relationships  . Social connections:    Talks on phone: Not on file    Gets together: Not on file    Attends religious service: Not on file    Active member of club or organization: Not on file    Attends meetings of clubs or organizations: Not on file    Relationship status: Not on  file  . Intimate partner violence:    Fear of current or ex partner: Not on file    Emotionally abused: Not on file    Physically abused: Not on file    Forced sexual activity: Not on file  Other Topics Concern  . Not on file  Social History Narrative  . Not on file    Family History:   The patient's family history includes Cancer in her sister; Heart disease in her mother and another family member; Stroke in her sister.  ROS:  Please see the history of present illness. Back pain has subsided. All other ROS reviewed and negative.     Physical Exam/Data:   Vitals:   01/03/18 0743 01/03/18 0819 01/03/18 1224 01/03/18 1548  BP: (!) 115/52 (!) 121/55    Pulse: 66 76    Resp: 16 (!) 22    Temp:  97.9 F (36.6 C)    TempSrc:  Oral    SpO2: 97% 96% 98% 98%  Weight:      Height:        Intake/Output Summary (Last 24 hours) at 01/03/2018 1634 Last data filed at 01/03/2018 1325 Gross per 24 hour  Intake 270 ml  Output 830 ml  Net -560 ml   Filed Weights   01/02/18 2346  Weight: 69.4 kg   Body mass index is 29.88 kg/m.  General: Well developed, well nourished lively AAF in no acute distress. Head: Normocephalic, atraumatic, sclera non-icteric, no xanthomas, nares are without discharge.  Neck:  JVD not elevated. Prominent chest veins Lungs: Bibasilar rales, no wheezing or rhonchi. Breathing is unlabored. Heart: RRR, distant heart sounds. No murmurs, rubs, or gallops appreciated. Abdomen: Soft, non-tender, non-distended with normoactive bowel sounds. No hepatomegaly. No rebound/guarding. No obvious abdominal masses. Msk:  Strength and tone appear normal for age. Extremities: No clubbing or  cyanosis. No edema.  Distal pedal pulses are 2+ and equal bilaterally. Neuro: Alert and oriented X 3. No facial asymmetry. No focal deficit. Moves all extremities spontaneously. Psych:  Responds to questions appropriately with a normal affect.  EKG:  The EKG was personally reviewed and demonstrates NSR 88bpm with occasional PVC, LBBB morphology  Laboratory Data:  Chemistry Recent Labs  Lab 01/02/18 2339 01/03/18 0009 01/03/18 0344  NA 140 139 140  K 4.0 4.1 3.5  CL 102 102 104  CO2 27  --  24  GLUCOSE 127* 121* 196*  BUN 17 26* 21  CREATININE 1.05* 0.90 1.16*  CALCIUM 8.9  --  8.7*  GFRNONAA 47*  --  41*  GFRAA 54*  --  48*  ANIONGAP 11  --  12    Recent Labs  Lab 01/02/18 2339  PROT 6.6  ALBUMIN 3.4*  AST 65*  ALT 45*  ALKPHOS 90  BILITOT 1.4*   Hematology Recent Labs  Lab 01/02/18 2339 01/03/18 0009 01/03/18 0344  WBC 9.5  --  9.4  RBC 2.97*  --  3.00*  HGB 11.0* 11.9* 10.4*  HCT 30.2* 35.0* 29.7*  MCV 101.7*  --  99.0  MCH 37.0*  --  34.7*  MCHC 36.4*  --  35.0  RDW 13.1  --  12.7  PLT 200  --  181   Cardiac Enzymes Recent Labs  Lab 01/03/18 0344 01/03/18 0908 01/03/18 1511  TROPONINI 0.23* 0.22* 0.28*    Recent Labs  Lab 01/03/18 0007  TROPIPOC 0.22*    BNP Recent Labs  Lab 01/02/18 2339  BNP 1,380.6*  DDimer No results for input(s): DDIMER in the last 168 hours.  Radiology/Studies:  Dg Chest Port 1 View  Result Date: 01/03/2018 CLINICAL DATA:  Acute onset of shortness of breath. EXAM: PORTABLE CHEST 1 VIEW COMPARISON:  Chest radiograph performed 11/10/2017 FINDINGS: The lungs are well-aerated. Vascular congestion is noted. Increased interstitial markings may reflect mild interstitial edema. There is no evidence of pleural effusion or pneumothorax. The cardiomediastinal silhouette is mildly enlarged. No acute osseous abnormalities are seen. Degenerative change is noted at the glenohumeral joints bilaterally, more prominent on the right.  IMPRESSION: Vascular congestion and mild cardiomegaly. Increased interstitial markings may reflect mild interstitial edema. Electronically Signed   By: Garald Balding M.D.   On: 01/03/2018 00:22    Assessment and Plan:   1. Acute on chronic combined CHF - clinically here with recurrent volume overload, improving with IV diuresis - would continue present regimen. Her OP blood pressure 12/10/17 was 142/82 but prior pressures have been low-normal. I wonder if there would be an opportunity to trial at least one additional HF medication to see if her BP would tolerate it. ACE/ARB might be less ideal given her creatinine clearance (42mL/min) but could consider low dose Imdur or hydralazine. Beta blocker has traditionally been avoided presumed due to borderline low HR in the past. Her pulmonary status has also been an issue. We had a long discussion about daily weights and notifying our office if she hits 3lb above baseline weight. Might need to consider changing to torsemide at discharge. Will also consult care management as she would probably be a good candidate for regular home health care or telemedicine with [facility].  2. Elevated troponin - she has not had any chest pain but did have some back pain. She has never had a cardiac cath. I do not suspect given her age and comorbidities that she would be a great candidate for invasive management but will finalize plans with Dr. Percival Spanish. She is not presently on ASA due to concomitant apixaban. Would not start statin at present time given age and elevated LFTs.  3. Suspected CKD stage III - most recent baseline appears 1.1-1.24, stable.  4. NSVT - per notes, managed conservatively. Not previously on BB due to h/o borderline bradycardia. Give 87meq KCL given K 3.5. Will not start any standing doses as previous K was 4.7 as OP.  5. PAF - maintaining NSR, on appropriate dose of Eliquis without bleeding reported.  For questions or updates, please contact [place] Please consult [email or web address] for contact info under Cardiology/STEMI. But she has 52 , And I would check a troponin on her Signed, Charlie Pitter, PA-C 01/03/2018 4:34 PM   History and all data above reviewed.  Patient examined.  I agree with the findings as above.  The patient presents with gradual weight gain.  She has increased dyspnea.  Is evidence of edema on chest x-ray.  BNP is elevated.  She is been treated with bronchodilators and steroids.  And given IV diuretics.  She does feel better but not quite at baseline.  Tries to avoid salt might get some in her foods.  She does weigh herself daily and has not had a significant sudden weight gain but has had a slow increase in her weight.  Is not had any new chest pressure, neck or arm discomfort.  She is had some cough.  She had no fevers or chills.  The patient presents with a The patient exam reveals COR:RRR  ,  Lungs:  Decreased breath sounds  ,  Abd: Positive bowel sounds, no rebound no guarding, Ext Mild ankle edema.  Neck:  JVD 8 cm at 45 degrees.  Dr. Percival Spanish.  All available labs, radiology testing, previous records reviewed. Agree with documented assessment and plan.   We discussed at length the need for aggressive salt and fluid restriction.  We talked about PRN dosing of Lasix and she will need to have some detail added to her understanding of this at discharge. I dont' think that her BP leaves much room for med titration.   Jeneen Rinks Zoran Yankee  5:40 PM  01/03/2018

## 2018-01-03 NOTE — H&P (Signed)
History and Physical    [name] SNK:[id number] DOB: [birth year] DOA: 01/02/2018  PCP: Leonard Downing, MD  Patient coming from: Home.  Chief Complaint: Shortness of breath.  HPI: [name] is a 85 y.o. female with history of chronic respiratory failure with COPD on home oxygen, chronic combined systolic and diastolic CHF last EF measured was around 35% with grade 1 diastolic dysfunction, chronic kidney disease stage III, chronic anemia was recently admitted a month and a half ago for CHF and COPD presents to the ER because of worsening shortness of breath even at rest for last 1 week.  Over the last 24 hours patient also had some upper back pain.  Denies any productive cough fever chills.  Denies nausea vomiting abdominal pain or diarrhea.  ED Course: In the ER patient was hypoxic wheezing chest x-ray showing congestion mild elevated troponin EKG showing LBBB.  Patient appeared volume overloaded was given Lasix 80 mg IV and some nebulizer treatment for wheezing admitted for respiratory failure likely a combination of CHF and COPD.  At the time of my exam patient states he is short of breath but much better than what she came.  Upper back pain is resolved.  Review of Systems: As per HPI, rest all negative.   Past Medical History:  Diagnosis Date  . Anemia    years ago  . Arthritis    "right shoulder" (01/02/2012)  . Bladder mass   . CAP (community acquired pneumonia) 01/02/2012   Lower lobe   . Chronic combined systolic and diastolic CHF (congestive heart failure) ([place])   . Chronic respiratory failure ([place])   . COPD (chronic obstructive pulmonary disease) ([place])   . Difficulty sleeping   . Diverticulitis of colon with perforation 01/14/2012   That is post sigmoid resection with Procedure [facility] pouch and end colostomy   . GERD (gastroesophageal reflux disease)    occasional  . Goiter    radioactive iodine ablation/notes 01/02/2012  . History of kidney stones    X 1  . Hypertension     . Hypothyroidism    "I have taken Synthroid before" (01/02/2012)   HAS HAD RADIOACTIVE IODINE TX 2 YR S AGO  . Intra-abdominal abscess ([place]) 01/14/2012  . NSVT (nonsustained ventricular tachycardia) ([place])   . PAF (paroxysmal atrial fibrillation) ([place])   . Pneumatosis of intestines 01/14/2012  . Pneumonia 01/02/2012; ? 2009  . PVC's (premature ventricular contractions)   . Radiation 08/10/13-09/15/13   57 gray to vaginal/bladder mass  . SOB (shortness of breath)    'all the time" (01/02/2012)  . Stroke ([place])   . UTI (lower urinary tract infection) 01/03/2012    Past Surgical History:  Procedure Laterality Date  . ABDOMINAL HYSTERECTOMY  1980's  . APPENDECTOMY  1980's   "when they did hysterectomy" (01/02/2012)  . CATARACT EXTRACTION W/ INTRAOCULAR LENS  IMPLANT, BILATERAL  ?1990's   [facility]  . COLON SURGERY    . COLOSTOMY N/A 03/12/2012   Procedure: COLOSTOMY;  Surgeon: Ralene Ok, MD;  Location: [facility];  Service: General;  Laterality: N/A;  . COLOSTOMY REVISION N/A 03/12/2012   Procedure: COLON RESECTION SIGMOID ;  Surgeon: Ralene Ok, MD;  Location: [facility];  Service: General;  Laterality: N/A;  . COLOSTOMY TAKEDOWN N/A 10/21/2012   Procedure: LAPAROSCOPIC COLOSTOMY TAKEDOWN AND HARTMANS ANASTOMOSIS, rigid proctoscopy;  Surgeon: Ralene Ok, MD;  Location: [facility];  Service: General;  Laterality: N/A;  . EYE SURGERY    . LYSIS OF ADHESION  N/A 10/21/2012   Procedure: LYSIS OF ADHESION;  Surgeon: Ralene Ok, MD;  Location: [facility];  Service: General;  Laterality: N/A;  . TRANSURETHRAL RESECTION OF BLADDER TUMOR WITH GYRUS (TURBT-GYRUS) N/A 06/30/2013   Procedure: TRANSURETHRAL RESECTION OF BLADDER TUMOR WITH GYRUS (TURBT-GYRUS)/VAGINAL BIOPSY;  Surgeon: Ardis Hughs, MD;  Location: [facility];  Service: Urology;  Laterality: N/A;     reports that she quit smoking about 27 years ago. Her smoking use included cigarettes. She has a 4.20 pack-year smoking history. She has never used  smokeless tobacco. She reports that she does not drink alcohol or use drugs.  Allergies  Allergen Reactions  . Indomethacin Anaphylaxis and Other (See Comments)    "took it fine for awhile; one day I stopped breathing and I ended up in the hospital" (01/02/2012) Pt has tolerated aspirin    Family History  Problem Relation Age of Onset  . Cancer Sister        Breast  . Stroke Sister   . Heart disease Mother   . Heart disease Other     Prior to Admission medications   Medication Sig Start Date End Date Taking? Authorizing Provider  acetaminophen (TYLENOL) 325 MG tablet Take 2 tablets (650 mg total) by mouth every 4 (four) hours as needed for headache or mild pain. 11/15/17  Yes Bonnell Public, MD  albuterol (PROVENTIL HFA;VENTOLIN HFA) 108 (90 BASE) MCG/ACT inhaler Inhale 2 puffs into the lungs every 6 (six) hours as needed for wheezing.   Yes [provider]  albuterol (PROVENTIL) (2.5 MG/3ML) 0.083% nebulizer solution Take 3 mLs (2.5 mg total) by nebulization every 6 (six) hours as needed for wheezing or shortness of breath. 12/17/15  Yes Elgergawy, Silver Huguenin, MD  apixaban (ELIQUIS) 5 MG TABS tablet Take 1 tablet (5 mg total) by mouth 2 (two) times daily. 08/28/15  Yes Ghimire, Henreitta Leber, MD  cetirizine (ZYRTEC) 10 MG tablet Take 10 mg by mouth daily as needed for allergies.    Yes [provider]  cholecalciferol (VITAMIN D) 1000 units tablet Take 1,000 Units by mouth daily.   Yes [provider]  docusate sodium (COLACE) 100 MG capsule Take 1 capsule (100 mg total) by mouth 2 (two) times daily. Patient taking differently: Take 100 mg by mouth 2 (two) times daily as needed for mild constipation.  05/01/17  Yes Oretha Milch D, MD  ferrous sulfate 325 (65 FE) MG tablet Take 325 mg by mouth 2 (two) times daily with a meal.    Yes [provider]  fluticasone (FLONASE) 50 MCG/ACT nasal spray Place 1 spray into both nostrils daily as needed for allergies.   07/19/17  Yes [provider]  furosemide (LASIX) 40 MG tablet Take 1 tablet (40 mg total) by mouth 2 (two) times daily. 11/15/17  Yes Bonnell Public, MD  mometasone-formoterol (DULERA) 100-5 MCG/ACT AERO Inhale 2 puffs into the lungs 2 (two) times daily. 11/15/17  Yes Dana Allan I, MD  pantoprazole (PROTONIX) 40 MG tablet Take 1 tablet (40 mg total) by mouth daily. 08/14/17  Yes Fenton Foy, NP  polyethylene glycol (MIRALAX / GLYCOLAX) packet Take 17 g by mouth daily as needed for mild constipation.    Yes [provider]  polyvinyl alcohol (LIQUIFILM TEARS) 1.4 % ophthalmic solution Place 1 drop into both eyes as needed for dry eyes.   Yes [provider]    Physical Exam: Vitals:   01/03/18 0200 01/03/18 0230 01/03/18 0245 01/03/18  0300  BP: (!) 122/55 94/73 106/89 (!) 112/53  Pulse: 74 83 81 78  Resp: (!) 24 (!) 29 (!) 27 (!) 31  Temp:      TempSrc:      SpO2: 90% 93% 92% 94%  Weight:      Height:          Constitutional: Moderately built and nourished. Vitals:   01/03/18 0200 01/03/18 0230 01/03/18 0245 01/03/18 0300  BP: (!) 122/55 94/73 106/89 (!) 112/53  Pulse: 74 83 81 78  Resp: (!) 24 (!) 29 (!) 27 (!) 31  Temp:      TempSrc:      SpO2: 90% 93% 92% 94%  Weight:      Height:       Eyes: Anicteric no pallor. ENMT: No discharge from the ears eyes nose or mouth. Neck: No JVD no mass felt. Respiratory: Mild expiratory wheeze and no crepitations. Cardiovascular: S1-S2 heard. Abdomen: Soft nontender bowel sounds present. Musculoskeletal: No edema. Skin: No rash. Neurologic: Alert awake oriented to time place and person.  Moves all extremities. Psychiatric: Appears normal.  Normal affect.   Labs on Admission: I have personally reviewed following labs and imaging studies  CBC: Recent Labs  Lab 01/02/18 2339 01/03/18 0009  WBC 9.5  --   NEUTROABS 7.4  --   HGB 11.0* 11.9*  HCT 30.2* 35.0*  MCV 101.7*  --   PLT 200   --    Basic Metabolic Panel: Recent Labs  Lab 01/02/18 2339 01/03/18 0009  NA 140 139  K 4.0 4.1  CL 102 102  CO2 27  --   GLUCOSE 127* 121*  BUN 17 26*  CREATININE 1.05* 0.90  CALCIUM 8.9  --    GFR: Estimated Creatinine Clearance: 36.1 mL/min (by C-G formula based on SCr of 0.9 mg/dL). Liver Function Tests: Recent Labs  Lab 01/02/18 2339  AST 65*  ALT 45*  ALKPHOS 90  BILITOT 1.4*  PROT 6.6  ALBUMIN 3.4*   No results for input(s): LIPASE, AMYLASE in the last 168 hours. No results for input(s): AMMONIA in the last 168 hours. Coagulation Profile: No results for input(s): INR, PROTIME in the last 168 hours. Cardiac Enzymes: No results for input(s): CKTOTAL, CKMB, CKMBINDEX, TROPONINI in the last 168 hours. BNP (last 3 results) No results for input(s): PROBNP in the last 8760 hours. HbA1C: No results for input(s): HGBA1C in the last 72 hours. CBG: No results for input(s): GLUCAP in the last 168 hours. Lipid Profile: No results for input(s): CHOL, HDL, LDLCALC, TRIG, CHOLHDL, LDLDIRECT in the last 72 hours. Thyroid Function Tests: No results for input(s): TSH, T4TOTAL, FREET4, T3FREE, THYROIDAB in the last 72 hours. Anemia Panel: No results for input(s): VITAMINB12, FOLATE, FERRITIN, TIBC, IRON, RETICCTPCT in the last 72 hours. Urine analysis:    Component Value Date/Time   COLORURINE YELLOW 07/22/2017 [place] 07/22/2017 1204   LABSPEC 1.021 07/22/2017 1204   LABSPEC 1.010 10/29/2013 1105   PHURINE 6.0 07/22/2017 1204   GLUCOSEU NEGATIVE 07/22/2017 1204   GLUCOSEU Negative 10/29/2013 1105   HGBUR NEGATIVE 07/22/2017 1204   BILIRUBINUR NEGATIVE 07/22/2017 1204   BILIRUBINUR Negative 10/29/2013 1105   KETONESUR NEGATIVE 07/22/2017 1204   PROTEINUR 30 (A) 07/22/2017 1204   UROBILINOGEN 1.0 02/13/2014 1137   UROBILINOGEN 0.2 10/29/2013 1105   NITRITE NEGATIVE 07/22/2017 1204   LEUKOCYTESUR SMALL (A) 07/22/2017 1204   LEUKOCYTESUR Trace  10/29/2013 1105   Sepsis Labs: @LABRCNTIP (procalcitonin:4,lacticidven:4) )No  results found for this or any previous visit (from the past 240 hour(s)).   Radiological Exams on Admission: Dg Chest Port 1 View  Result Date: 01/03/2018 CLINICAL DATA:  Acute onset of shortness of breath. EXAM: PORTABLE CHEST 1 VIEW COMPARISON:  Chest radiograph performed 11/10/2017 FINDINGS: The lungs are well-aerated. Vascular congestion is noted. Increased interstitial markings may reflect mild interstitial edema. There is no evidence of pleural effusion or pneumothorax. The cardiomediastinal silhouette is mildly enlarged. No acute osseous abnormalities are seen. Degenerative change is noted at the glenohumeral joints bilaterally, more prominent on the right. IMPRESSION: Vascular congestion and mild cardiomegaly. Increased interstitial markings may reflect mild interstitial edema. Electronically Signed   By: Garald Balding M.D.   On: 01/03/2018 00:22    EKG: Independently reviewed.  Sinus rhythm with LBBB.  PVCs.  Assessment/Plan Active Problems:   Hypertension   H/O: hypothyroidism   Essential hypertension   PAF (paroxysmal atrial fibrillation) (H[facility])   Acute on chronic systolic (congestive) heart failure (HCC)   NSVT (nonsustained ventricular tachycardia) (H[facility])   Acute CHF (congestive heart failure) ([place])    1. Acute respiratory failure with hypoxia secondary to a combination of CHF decompensation and COPD.  Last 2D echo done in November 2019 showed EF of 30 to 35% with grade 1 diastolic dysfunction.  Patient placed on Lasix 60 mg IV every 12.  Continue nebulizer treatment.  Closely follow intake or metabolic panel daily weights.  Patient did receive 1 dose of Lasix 80 mg in the ER. 2. Upper back pain with positive troponin we will cycle cardiac markers.  Patient is on apixaban.  Presently upper back pain is resolved. 3. Chronic kidney disease stage III creatinine appears to be at baseline. 4. Chronic anemia  likely from renal disease on iron supplements. 5. Document atrial fibrillation on apixaban.  Not on rate limiting medications due to baseline heart rate being low. 6. History of NSVT.   DVT prophylaxis: Apixaban. Code Status: DNR. Family Communication: Patient's son. Disposition Plan: Home. Consults called: Cardiology. Admission status: Inpatient.   Rise Patience MD [facility] Pager [phone].  If 7PM-7AM, please contact night-coverage [email or web address] Password [email or web address]  01/03/2018, 3:28 AM

## 2018-01-04 ENCOUNTER — Inpatient Hospital Stay (HOSPITAL_COMMUNITY): Payer: Medicare Other

## 2018-01-04 DIAGNOSIS — I5041 Acute combined systolic (congestive) and diastolic (congestive) heart failure: Secondary | ICD-10-CM

## 2018-01-04 DIAGNOSIS — I10 Essential (primary) hypertension: Secondary | ICD-10-CM

## 2018-01-04 LAB — BASIC METABOLIC PANEL
Anion gap: 9 (ref 5–15)
BUN: 25 mg/dL — ABNORMAL HIGH (ref 8–23)
CO2: 30 mmol/L (ref 22–32)
Calcium: 9 mg/dL (ref 8.9–10.3)
Chloride: 101 mmol/L (ref 98–111)
Creatinine, Ser: 1.09 mg/dL — ABNORMAL HIGH (ref 0.44–1.00)
GFR calc Af Amer: 52 mL/min — ABNORMAL LOW (ref >60)
GFR calc non Af Amer: 45 mL/min — ABNORMAL LOW (ref >60)
Glucose, Bld: 141 mg/dL — ABNORMAL HIGH (ref 70–99)
Potassium: 4.1 mmol/L (ref 3.5–5.1)
Sodium: 140 mmol/L (ref 135–145)

## 2018-01-04 MED ORDER — IPRATROPIUM-ALBUTEROL 0.5-2.5 (3) MG/3ML IN SOLN
3.0000 mL | Freq: Three times a day (TID) | RESPIRATORY_TRACT | Status: DC
  Administered 2018-01-05: 3 mL via RESPIRATORY_TRACT
  Filled 2018-01-04: qty 3

## 2018-01-04 MED ORDER — GUAIFENESIN-DM 100-10 MG/5ML PO SYRP
5.0000 mL | ORAL_SOLUTION | ORAL | Status: DC | PRN
  Administered 2018-01-04 – 2018-01-08 (×11): 5 mL via ORAL
  Filled 2018-01-04 (×11): qty 5

## 2018-01-04 MED ORDER — LORATADINE 10 MG PO TABS
10.0000 mg | ORAL_TABLET | Freq: Every day | ORAL | Status: DC
  Administered 2018-01-04 – 2018-01-09 (×6): 10 mg via ORAL
  Filled 2018-01-04 (×6): qty 1

## 2018-01-04 MED ORDER — SPIRONOLACTONE 12.5 MG HALF TABLET
12.5000 mg | ORAL_TABLET | Freq: Every day | ORAL | Status: DC
  Administered 2018-01-04 – 2018-01-09 (×6): 12.5 mg via ORAL
  Filled 2018-01-04 (×6): qty 1

## 2018-01-04 MED ORDER — IPRATROPIUM-ALBUTEROL 0.5-2.5 (3) MG/3ML IN SOLN
3.0000 mL | Freq: Four times a day (QID) | RESPIRATORY_TRACT | Status: DC
  Administered 2018-01-04 (×2): 3 mL via RESPIRATORY_TRACT
  Filled 2018-01-04 (×2): qty 3

## 2018-01-04 NOTE — Progress Notes (Signed)
Progress Note  Patient Name: [name] Date of Encounter: 01/04/2018  Primary Cardiologist: Mertie Moores, MD   Subjective   No CP; dyspneic earlier this morning but improved.  Complains of productive cough.  Inpatient Medications    Scheduled Meds: . apixaban  5 mg Oral BID  . ferrous sulfate  325 mg Oral BID WC  . furosemide  60 mg Intravenous Q12H  . ipratropium-albuterol  3 mL Nebulization Q6H  . loratadine  10 mg Oral Daily  . mouth rinse  15 mL Mouth Rinse BID  . mometasone-formoterol  2 puff Inhalation BID  . pantoprazole  40 mg Oral Daily  . predniSONE  40 mg Oral QAC breakfast   Continuous Infusions:  PRN Meds: acetaminophen **OR** acetaminophen, albuterol, docusate sodium, fluticasone, guaiFENesin-dextromethorphan, ondansetron **OR** ondansetron (ZOFRAN) IV, polyethylene glycol   Vital Signs    Vitals:   01/03/18 2320 01/04/18 0426 01/04/18 0917 01/04/18 0943  BP: 113/66 (!) 149/78  (!) 121/58  Pulse: 83 80  89  Resp: 20 20    Temp: 98.8 F (37.1 C) 99.5 F (37.5 C)    TempSrc: Oral Oral    SpO2: 95% 98% 98% 95%  Weight:  69.4 kg    Height:        Intake/Output Summary (Last 24 hours) at 01/04/2018 1242 Last data filed at 01/04/2018 1102 Gross per 24 hour  Intake 840 ml  Output 1400 ml  Net -560 ml   Filed Weights   01/02/18 2346 01/03/18 0819 01/04/18 0426  Weight: 69.4 kg 70.2 kg 69.4 kg    Telemetry    Sinus with PVCs - Personally Reviewed  Physical Exam   GEN: No acute distress.   Neck: No JVD Cardiac: RRR, no murmurs, rubs, or gallops.  Respiratory: Clear to auscultation bilaterally. GI: Soft, nontender, non-distended  MS: No edema Neuro:  Nonfocal  Psych: Normal affect   Labs    Chemistry Recent Labs  Lab 01/02/18 2339 01/03/18 0009 01/03/18 0344 01/04/18 0542  NA 140 139 140 140  K 4.0 4.1 3.5 4.1  CL 102 102 104 101  CO2 27  --  24 30  GLUCOSE 127* 121* 196* 141*  BUN 17 26* 21 25*  CREATININE 1.05* 0.90 1.16*  1.09*  CALCIUM 8.9  --  8.7* 9.0  PROT 6.6  --   --   --   ALBUMIN 3.4*  --   --   --   AST 65*  --   --   --   ALT 45*  --   --   --   ALKPHOS 90  --   --   --   BILITOT 1.4*  --   --   --   GFRNONAA 47*  --  41* 45*  GFRAA 54*  --  48* 52*  ANIONGAP 11  --  12 9     Hematology Recent Labs  Lab 01/02/18 2339 01/03/18 0009 01/03/18 0344  WBC 9.5  --  9.4  RBC 2.97*  --  3.00*  HGB 11.0* 11.9* 10.4*  HCT 30.2* 35.0* 29.7*  MCV 101.7*  --  99.0  MCH 37.0*  --  34.7*  MCHC 36.4*  --  35.0  RDW 13.1  --  12.7  PLT 200  --  181    Cardiac Enzymes Recent Labs  Lab 01/03/18 0344 01/03/18 0908 01/03/18 1511  TROPONINI 0.23* 0.22* 0.28*    Recent Labs  Lab 01/03/18 0007  TROPIPOC 0.22*  BNP Recent Labs  Lab 01/02/18 2339  BNP 1,380.6*      Radiology    Dg Chest Port 1 View  Result Date: 01/03/2018 CLINICAL DATA:  Acute onset of shortness of breath. EXAM: PORTABLE CHEST 1 VIEW COMPARISON:  Chest radiograph performed 11/10/2017 FINDINGS: The lungs are well-aerated. Vascular congestion is noted. Increased interstitial markings may reflect mild interstitial edema. There is no evidence of pleural effusion or pneumothorax. The cardiomediastinal silhouette is mildly enlarged. No acute osseous abnormalities are seen. Degenerative change is noted at the glenohumeral joints bilaterally, more prominent on the right. IMPRESSION: Vascular congestion and mild cardiomegaly. Increased interstitial markings may reflect mild interstitial edema. Electronically Signed   By: Garald Balding M.D.   On: 01/03/2018 00:22    Patient Profile     85 y.o. female with past medical history of chronic combined systolic/diastolic congestive heart failure, paroxysmal atrial fibrillation, nonsustained ventricular tachycardia, chronic stage III kidney disease, COPD on home oxygen for evaluation of acute on chronic combined systolic/diastolic congestive heart failure.  Last echocardiogram November  2019 showed an ejection fraction 30 to 91%, grade 1 diastolic dysfunction, mild mitral regurgitation and biatrial enlargement.  Assessment & Plan    1 acute on chronic combined systolic/diastolic congestive heart failure-weight 69.4 kg.  I's and O's -830.  We will continue present dose of Lasix.  Follow renal function closely.  2 presumed nonischemic cardiomyopathy-ejection fraction 30 to 35%.  Blood pressure is borderline and we have therefore not added an ARB or beta-blocker.  I will add spironolactone 12.5 mg daily.  Not a candidate for ICD given age.  3 home O2 dependent COPD-continue steroids and bronchodilators.  Patient does complain of productive cough.  May need antibiotic.  Will leave to primary care.  4 elevated troponin-patient has not had chest pain.  No clear trend.  Would not pursue further ischemia evaluation.  5 paroxysmal atrial fibrillation-patient remains in sinus rhythm.  Continue apixaban at present dose.  6 history of nonsustained ventricular tachycardia-patient not on a beta-blocker as apparently she has had bradycardia and low blood pressure in the past.  For questions or updates, please contact [place] Please consult [email or web address] for contact info under        Signed, Kirk Ruths, MD  01/04/2018, 12:42 PM

## 2018-01-04 NOTE — Progress Notes (Signed)
PROGRESS NOTE    [name]  LNL:[id number] DOB: 1925-11-27 DOA: 01/02/2018 PCP: Leonard Downing, MD    Brief Narrative: [name] is a 85 y.o. female with history of chronic respiratory failure with COPD on home oxygen, chronic combined systolic and diastolic CHF last EF measured was around 35% with grade 1 diastolic dysfunction, chronic kidney disease stage III, chronic anemia was recently admitted a month and a half ago for CHF and COPD presents to the ER because of worsening shortness of breath even at rest for last 1 week. Assessment & Plan:   Active Problems:   Hypertension   H/O: hypothyroidism   Essential hypertension   PAF (paroxysmal atrial fibrillation) (H[facility])   Acute on chronic systolic (congestive) heart failure (H[facility])   NSVT (nonsustained ventricular tachycardia) (HCC)   Acute CHF (congestive heart failure) ([facility])   Acute respiratory failure with hypoxia probably secondary to acute on chronic systolic heart failure and mild COPD exacerbation Improving, she is on Lasix 60 mg twice daily.  Cardiology consulted and recommendations given. Continue with strict intake and output and daily weights.  He has diuresed only about 1.5 L the last 24 hours.  Creatinine has improved from 1.16-1.09. Elevated troponins probably secondary to demand ischemia from CHF.   Paroxysmal atrial fibrillation Rate controlled and on Eliquis for anticoagulation   Mild COPD exacerbation Wheezing has improved but she continues to have some shortness of breath and tachypnea Duo nebs ordered continue with Dulera and prednisone taper.   Hypertension Well-controlled  DVT prophylaxis: Eliquis  code Status: DNR Family Communication: None at bedside  disposition Plan: Pending clinical improvement   Consultants:   Cardiology  Procedures: None  Antimicrobials: None   Subjective: Patient reports she had a long night had some shortness of breath. She has postnasal drip.  She denies  any chest pain.  She denies any nausea vomiting or abdominal pain  Objective: Vitals:   01/03/18 2320 01/04/18 0426 01/04/18 0917 01/04/18 0943  BP: 113/66 (!) 149/78  (!) 121/58  Pulse: 83 80  89  Resp: 20 20    Temp: 98.8 F (37.1 C) 99.5 F (37.5 C)    TempSrc: Oral Oral    SpO2: 95% 98% 98% 95%  Weight:  69.4 kg    Height:        Intake/Output Summary (Last 24 hours) at 01/04/2018 1248 Last data filed at 01/04/2018 1102 Gross per 24 hour  Intake 840 ml  Output 1400 ml  Net -560 ml   Filed Weights   01/02/18 2346 01/03/18 0819 01/04/18 0426  Weight: 69.4 kg 70.2 kg 69.4 kg    Examination:  General exam: Mild distress from shortness of breath increased oxygen requirement from 2 L to 3 L. Respiratory system: Diminished air entry at bases, no wheezing or rhonchi Cardiovascular system: S1 & S2 heard, irregular  Gastrointestinal system: Abdomen is nondistended, soft and nontender. No organomegaly or masses felt. Normal bowel sounds heard. Central nervous system: Alert and oriented.  Grossly nonfocal Extremities: Symmetric 5 x 5 power. Skin: No rashes, lesions or ulcers Psychiatry:  Mood & affect appropriate.     Data Reviewed: I have personally reviewed following labs and imaging studies  CBC: Recent Labs  Lab 01/02/18 2339 01/03/18 0009 01/03/18 0344  WBC 9.5  --  9.4  NEUTROABS 7.4  --   --   HGB 11.0* 11.9* 10.4*  HCT 30.2* 35.0* 29.7*  MCV 101.7*  --  99.0  PLT 200  --  413   Basic Metabolic Panel: Recent Labs  Lab 01/02/18 2339 01/03/18 0009 01/03/18 0344 01/04/18 0542  NA 140 139 140 140  K 4.0 4.1 3.5 4.1  CL 102 102 104 101  CO2 27  --  24 30  GLUCOSE 127* 121* 196* 141*  BUN 17 26* 21 25*  CREATININE 1.05* 0.90 1.16* 1.09*  CALCIUM 8.9  --  8.7* 9.0  MG  --   --  1.9  --    GFR: Estimated Creatinine Clearance: 29.8 mL/min (A) (by C-G formula based on SCr of 1.09 mg/dL (H)). Liver Function Tests: Recent Labs  Lab 01/02/18 2339  AST 65*   ALT 45*  ALKPHOS 90  BILITOT 1.4*  PROT 6.6  ALBUMIN 3.4*   No results for input(s): LIPASE, AMYLASE in the last 168 hours. No results for input(s): AMMONIA in the last 168 hours. Coagulation Profile: No results for input(s): INR, PROTIME in the last 168 hours. Cardiac Enzymes: Recent Labs  Lab 01/03/18 0344 01/03/18 0908 01/03/18 1511  TROPONINI 0.23* 0.22* 0.28*   BNP (last 3 results) No results for input(s): PROBNP in the last 8760 hours. HbA1C: No results for input(s): HGBA1C in the last 72 hours. CBG: No results for input(s): GLUCAP in the last 168 hours. Lipid Profile: No results for input(s): CHOL, HDL, LDLCALC, TRIG, CHOLHDL, LDLDIRECT in the last 72 hours. Thyroid Function Tests: No results for input(s): TSH, T4TOTAL, FREET4, T3FREE, THYROIDAB in the last 72 hours. Anemia Panel: No results for input(s): VITAMINB12, FOLATE, FERRITIN, TIBC, IRON, RETICCTPCT in the last 72 hours. Sepsis Labs: No results for input(s): PROCALCITON, LATICACIDVEN in the last 168 hours.  No results found for this or any previous visit (from the past 240 hour(s)).       Radiology Studies: Dg Chest Port 1 View  Result Date: 01/03/2018 CLINICAL DATA:  Acute onset of shortness of breath. EXAM: PORTABLE CHEST 1 VIEW COMPARISON:  Chest radiograph performed 11/10/2017 FINDINGS: The lungs are well-aerated. Vascular congestion is noted. Increased interstitial markings may reflect mild interstitial edema. There is no evidence of pleural effusion or pneumothorax. The cardiomediastinal silhouette is mildly enlarged. No acute osseous abnormalities are seen. Degenerative change is noted at the glenohumeral joints bilaterally, more prominent on the right. IMPRESSION: Vascular congestion and mild cardiomegaly. Increased interstitial markings may reflect mild interstitial edema. Electronically Signed   By: Garald Balding M.D.   On: 01/03/2018 00:22        Scheduled Meds: . apixaban  5 mg Oral BID    . ferrous sulfate  325 mg Oral BID WC  . furosemide  60 mg Intravenous Q12H  . ipratropium-albuterol  3 mL Nebulization Q6H  . loratadine  10 mg Oral Daily  . mouth rinse  15 mL Mouth Rinse BID  . mometasone-formoterol  2 puff Inhalation BID  . pantoprazole  40 mg Oral Daily  . predniSONE  40 mg Oral QAC breakfast   Continuous Infusions:   LOS: 1 day    Time spent: 35 minutes.     Hosie Poisson, MD [facility] Pager [phone]  If 7PM-7AM, please contact night-coverage [email or web address] Password [facility] 01/04/2018, 12:48 PM

## 2018-01-05 LAB — BASIC METABOLIC PANEL
Anion gap: 10 (ref 5–15)
BUN: 24 mg/dL — ABNORMAL HIGH (ref 8–23)
CO2: 32 mmol/L (ref 22–32)
Calcium: 8.9 mg/dL (ref 8.9–10.3)
Chloride: 97 mmol/L — ABNORMAL LOW (ref 98–111)
Creatinine, Ser: 1.16 mg/dL — ABNORMAL HIGH (ref 0.44–1.00)
GFR calc Af Amer: 48 mL/min — ABNORMAL LOW (ref >60)
GFR calc non Af Amer: 41 mL/min — ABNORMAL LOW (ref >60)
Glucose, Bld: 127 mg/dL — ABNORMAL HIGH (ref 70–99)
Potassium: 3.7 mmol/L (ref 3.5–5.1)
Sodium: 139 mmol/L (ref 135–145)

## 2018-01-05 LAB — EXPECTORATED SPUTUM ASSESSMENT W GRAM STAIN, RFLX TO RESP C

## 2018-01-05 MED ORDER — AMOXICILLIN-POT CLAVULANATE 875-125 MG PO TABS: 1.0000 | ORAL_TABLET | Freq: Two times a day (BID) | ORAL | Status: DC

## 2018-01-05 MED ORDER — PREDNISONE 50 MG PO TABS
60.0000 mg | ORAL_TABLET | Freq: Every day | ORAL | Status: AC
  Administered 2018-01-06: 60 mg via ORAL
  Filled 2018-01-05: qty 1

## 2018-01-05 MED ORDER — AMOXICILLIN-POT CLAVULANATE 500-125 MG PO TABS
1.0000 | ORAL_TABLET | ORAL | Status: DC
  Administered 2018-01-05 – 2018-01-08 (×4): 500 mg via ORAL
  Filled 2018-01-05 (×5): qty 1

## 2018-01-05 MED ORDER — IPRATROPIUM-ALBUTEROL 0.5-2.5 (3) MG/3ML IN SOLN
3.0000 mL | Freq: Two times a day (BID) | RESPIRATORY_TRACT | Status: DC
  Administered 2018-01-05 – 2018-01-06 (×2): 3 mL via RESPIRATORY_TRACT
  Filled 2018-01-05 (×2): qty 3

## 2018-01-05 NOTE — Plan of Care (Signed)
  Problem: Health Behavior/Discharge Planning: Goal: Ability to manage health-related needs will improve Outcome: Progressing   Problem: Clinical Measurements: Goal: Respiratory complications will improve Outcome: Progressing Goal: Cardiovascular complication will be avoided Outcome: Progressing   Problem: Activity: Goal: Risk for activity intolerance will decrease Outcome: Progressing   Problem: Nutrition: Goal: Adequate nutrition will be maintained Outcome: Progressing   Problem: Elimination: Goal: Will not experience complications related to bowel motility Outcome: Progressing Goal: Will not experience complications related to urinary retention Outcome: Progressing   Problem: Pain Managment: Goal: General experience of comfort will improve Outcome: Progressing   Problem: Safety: Goal: Ability to remain free from injury will improve Outcome: Progressing

## 2018-01-05 NOTE — Progress Notes (Signed)
Progress Note  Patient Name: [name] Date of Encounter: 01/05/2018  Primary Cardiologist: Mertie Moores, MD   Subjective   No CP; dyspnea improving  Inpatient Medications    Scheduled Meds: . amoxicillin-clavulanate  1 tablet Oral Q24H  . apixaban  5 mg Oral BID  . ferrous sulfate  325 mg Oral BID WC  . furosemide  60 mg Intravenous Q12H  . ipratropium-albuterol  3 mL Nebulization BID  . loratadine  10 mg Oral Daily  . mouth rinse  15 mL Mouth Rinse BID  . mometasone-formoterol  2 puff Inhalation BID  . pantoprazole  40 mg Oral Daily  . [START ON 01/06/2018] predniSONE  60 mg Oral QAC breakfast  . spironolactone  12.5 mg Oral Daily   Continuous Infusions:  PRN Meds: acetaminophen **OR** acetaminophen, albuterol, docusate sodium, fluticasone, guaiFENesin-dextromethorphan, ondansetron **OR** ondansetron (ZOFRAN) IV, polyethylene glycol   Vital Signs    Vitals:   01/05/18 0020 01/05/18 0357 01/05/18 0723 01/05/18 0821  BP: 128/66 (!) 150/89 134/84   Pulse: 78 98 87   Resp: 20 20 18    Temp: 97.7 F (36.5 C) 98.9 F (37.2 C) 98.8 F (37.1 C)   TempSrc: Oral Oral Oral   SpO2: 95% 95% 91% 94%  Weight:  67.6 kg    Height:        Intake/Output Summary (Last 24 hours) at 01/05/2018 1125 Last data filed at 01/05/2018 0600 Gross per 24 hour  Intake 480 ml  Output 1000 ml  Net -520 ml   Filed Weights   01/03/18 0819 01/04/18 0426 01/05/18 0357  Weight: 70.2 kg 69.4 kg 67.6 kg    Telemetry    Sinus with PVCs - Personally Reviewed  Physical Exam   GEN: No acute distress.  WD/WN Neck: No JVD, supple Cardiac: RRR Respiratory: Diminished BS throughout GI: Soft, NT/ND MS: No edema Neuro:  Grossly intact   Labs    Chemistry Recent Labs  Lab 01/02/18 2339  01/03/18 0344 01/04/18 0542 01/05/18 0520  NA 140   < > 140 140 139  K 4.0   < > 3.5 4.1 3.7  CL 102   < > 104 101 97*  CO2 27  --  24 30 32  GLUCOSE 127*   < > 196* 141* 127*  BUN 17   < > 21  25* 24*  CREATININE 1.05*   < > 1.16* 1.09* 1.16*  CALCIUM 8.9  --  8.7* 9.0 8.9  PROT 6.6  --   --   --   --   ALBUMIN 3.4*  --   --   --   --   AST 65*  --   --   --   --   ALT 45*  --   --   --   --   ALKPHOS 90  --   --   --   --   BILITOT 1.4*  --   --   --   --   GFRNONAA 47*  --  41* 45* 41*  GFRAA 54*  --  48* 52* 48*  ANIONGAP 11  --  12 9 10    < > = values in this interval not displayed.     Hematology Recent Labs  Lab 01/02/18 2339 01/03/18 0009 01/03/18 0344  WBC 9.5  --  9.4  RBC 2.97*  --  3.00*  HGB 11.0* 11.9* 10.4*  HCT 30.2* 35.0* 29.7*  MCV 101.7*  --  99.0  MCH 37.0*  --  34.7*  MCHC 36.4*  --  35.0  RDW 13.1  --  12.7  PLT 200  --  181    Cardiac Enzymes Recent Labs  Lab 01/03/18 0344 01/03/18 0908 01/03/18 1511  TROPONINI 0.23* 0.22* 0.28*    Recent Labs  Lab 01/03/18 0007  TROPIPOC 0.22*     BNP Recent Labs  Lab 01/02/18 2339  BNP 1,380.6*      Radiology    Dg Chest 2 View  Result Date: 01/04/2018 CLINICAL DATA:  Increasing shortness of breath. EXAM: CHEST - 2 VIEW COMPARISON:  Single-view of the chest 01/02/2018 and PA and lateral chest 11/10/2017. Soft tissue neck CT 10/22/2016. FINDINGS: The lungs are clear. The chest is hyperexpanded with some prominence of the pulmonary interstitium. Heart size is normal. Aortic atherosclerosis is seen. Bulky calcifications at the base of the neck are consistent with thyroid calcifications seen on prior neck CT. IMPRESSION: No acute disease. Findings compatible with COPD. Electronically Signed   By: Inge Rise M.D.   On: 01/04/2018 13:44    Patient Profile     85 y.o. female with past medical history of chronic combined systolic/diastolic congestive heart failure, paroxysmal atrial fibrillation, nonsustained ventricular tachycardia, chronic stage III kidney disease, COPD on home oxygen for evaluation of acute on chronic combined systolic/diastolic congestive heart failure.  Last  echocardiogram November 2019 showed an ejection fraction 30 to 23%, grade 1 diastolic dysfunction, mild mitral regurgitation and biatrial enlargement.  Assessment & Plan    1 acute on chronic combined systolic/diastolic congestive heart failure-weight 67.6 kg.  I's and O's -550.  Slowly improving.  We will continue Lasix at present dose today.  Transition to oral tomorrow.  Needs fluid restriction to 1 L daily and low-sodium diet.  2 presumed nonischemic cardiomyopathy-ejection fraction 30 to 35%.  Blood pressure remains soft.  We have therefore not added an ARB or beta-blocker.  Continue Spironolactone at present dose.  Given age she is not a candidate for ICD.    3 home O2 dependent COPD-Per primary care.  4 elevated troponin-patient has not had chest pain.  No clear trend.  Would not pursue further ischemia evaluation.  5 paroxysmal atrial fibrillation-remains in sinus.  Continue apixaban.  6 history of nonsustained ventricular tachycardia-patient not on a beta-blocker as apparently she has had bradycardia and low blood pressure in the past.  For questions or updates, please contact [place] Please consult [email or web address] for contact info under        Signed, Kirk Ruths, MD  01/05/2018, 11:25 AM

## 2018-01-05 NOTE — Progress Notes (Signed)
PROGRESS NOTE    [name]  VZC:[id number] DOB: April 12, 1925 DOA: 01/02/2018 PCP: Leonard Downing, MD    Brief Narrative: [name] is a 85 y.o. female with history of chronic respiratory failure with COPD on home oxygen, chronic combined systolic and diastolic CHF last EF measured was around 35% with grade 1 diastolic dysfunction, chronic kidney disease stage III, chronic anemia was recently admitted a month and a half ago for CHF and COPD presents to the ER because of worsening shortness of breath even at rest for last 1 week. Assessment & Plan:   Active Problems:   Hypertension   H/O: hypothyroidism   Essential hypertension   PAF (paroxysmal atrial fibrillation) (HCC)   Acute on chronic systolic (congestive) heart failure (HCC)   NSVT (nonsustained ventricular tachycardia) (HCC)   Acute CHF (congestive heart failure) (HCC)   Acute respiratory failure with hypoxia probably secondary to acute on chronic systolic heart failure and mild COPD exacerbation Improving, she is on Lasix 60 mg twice daily.  Cardiology consulted and recommendations given. Added spironolactone. Continue with strict intake and output and daily weights.  He has diuresed only about 1 LIT overnight. She reports her breathing is the same as yesterday. Currently on 2l it of [place] oxygen.  Elevated troponins probably secondary to demand ischemia from CHF.   Paroxysmal atrial fibrillation Rate controlled and on Eliquis for anticoagulation   Mild COPD exacerbation Wheezing has improved but she continues to have some shortness of breath and tachypnea Duo nebs ordered continue with Dulera and prednisone taper. augmentin added for acute bronchitis and sputum cultures ordered.    Hypertension Well-controlled  DVT prophylaxis: Eliquis  code Status: DNR Family Communication: None at bedside  disposition Plan: Pending clinical improvement   Consultants:   Cardiology  Procedures: None  Antimicrobials:  Augmentin.    Subjective: Post nasal drip improved. On 2lit of [place] oxygen.  No chest pain . Breathing has improved,but pt is still tachypneic on talking.  Not back to baseline yet.   Objective: Vitals:   01/05/18 0020 01/05/18 0357 01/05/18 0723 01/05/18 0821  BP: 128/66 (!) 150/89 134/84   Pulse: 78 98 87   Resp: 20 20 18    Temp: 97.7 F (36.5 C) 98.9 F (37.2 C) 98.8 F (37.1 C)   TempSrc: Oral Oral Oral   SpO2: 95% 95% 91% 94%  Weight:  67.6 kg    Height:        Intake/Output Summary (Last 24 hours) at 01/05/2018 1038 Last data filed at 01/05/2018 0600 Gross per 24 hour  Intake 480 ml  Output 1150 ml  Net -670 ml   Filed Weights   01/03/18 0819 01/04/18 0426 01/05/18 0357  Weight: 70.2 kg 69.4 kg 67.6 kg    Examination:  General exam: mild distress with tachypnea on talking.  Respiratory system: coarse breath sounds bilateral.  Cardiovascular system: S1 & S2 heard, irregular,   Gastrointestinal system: Abdomen is soft non tender non distended bowel sounds good.  Central nervous system: Alert and oriented.  Grossly nonfocal Extremities: Symmetric 5 x 5 power. Skin: No rashes, lesions or ulcers Psychiatry:  Mood & affect appropriate.     Data Reviewed: I have personally reviewed following labs and imaging studies  CBC: Recent Labs  Lab 01/02/18 2339 01/03/18 0009 01/03/18 0344  WBC 9.5  --  9.4  NEUTROABS 7.4  --   --   HGB 11.0* 11.9* 10.4*  HCT 30.2* 35.0* 29.7*  MCV 101.7*  --  99.0  PLT 200  --  572   Basic Metabolic Panel: Recent Labs  Lab 01/02/18 2339 01/03/18 0009 01/03/18 0344 01/04/18 0542 01/05/18 0520  NA 140 139 140 140 139  K 4.0 4.1 3.5 4.1 3.7  CL 102 102 104 101 97*  CO2 27  --  24 30 32  GLUCOSE 127* 121* 196* 141* 127*  BUN 17 26* 21 25* 24*  CREATININE 1.05* 0.90 1.16* 1.09* 1.16*  CALCIUM 8.9  --  8.7* 9.0 8.9  MG  --   --  1.9  --   --    GFR: Estimated Creatinine Clearance: 27.6 mL/min (A) (by C-G formula based on  SCr of 1.16 mg/dL (H)). Liver Function Tests: Recent Labs  Lab 01/02/18 2339  AST 65*  ALT 45*  ALKPHOS 90  BILITOT 1.4*  PROT 6.6  ALBUMIN 3.4*   No results for input(s): LIPASE, AMYLASE in the last 168 hours. No results for input(s): AMMONIA in the last 168 hours. Coagulation Profile: No results for input(s): INR, PROTIME in the last 168 hours. Cardiac Enzymes: Recent Labs  Lab 01/03/18 0344 01/03/18 0908 01/03/18 1511  TROPONINI 0.23* 0.22* 0.28*   BNP (last 3 results) No results for input(s): PROBNP in the last 8760 hours. HbA1C: No results for input(s): HGBA1C in the last 72 hours. CBG: No results for input(s): GLUCAP in the last 168 hours. Lipid Profile: No results for input(s): CHOL, HDL, LDLCALC, TRIG, CHOLHDL, LDLDIRECT in the last 72 hours. Thyroid Function Tests: No results for input(s): TSH, T4TOTAL, FREET4, T3FREE, THYROIDAB in the last 72 hours. Anemia Panel: No results for input(s): VITAMINB12, FOLATE, FERRITIN, TIBC, IRON, RETICCTPCT in the last 72 hours. Sepsis Labs: No results for input(s): PROCALCITON, LATICACIDVEN in the last 168 hours.  No results found for this or any previous visit (from the past 240 hour(s)).       Radiology Studies: Dg Chest 2 View  Result Date: 01/04/2018 CLINICAL DATA:  Increasing shortness of breath. EXAM: CHEST - 2 VIEW COMPARISON:  Single-view of the chest 01/02/2018 and PA and lateral chest 11/10/2017. Soft tissue neck CT 10/22/2016. FINDINGS: The lungs are clear. The chest is hyperexpanded with some prominence of the pulmonary interstitium. Heart size is normal. Aortic atherosclerosis is seen. Bulky calcifications at the base of the neck are consistent with thyroid calcifications seen on prior neck CT. IMPRESSION: No acute disease. Findings compatible with COPD. Electronically Signed   By: Inge Rise M.D.   On: 01/04/2018 13:44        Scheduled Meds: . amoxicillin-clavulanate  1 tablet Oral Q12H  . apixaban   5 mg Oral BID  . ferrous sulfate  325 mg Oral BID WC  . furosemide  60 mg Intravenous Q12H  . ipratropium-albuterol  3 mL Nebulization BID  . loratadine  10 mg Oral Daily  . mouth rinse  15 mL Mouth Rinse BID  . mometasone-formoterol  2 puff Inhalation BID  . pantoprazole  40 mg Oral Daily  . predniSONE  40 mg Oral QAC breakfast  . spironolactone  12.5 mg Oral Daily   Continuous Infusions:   LOS: 2 days    Time spent: 32 minutes.     Hosie Poisson, MD [facility] Pager [phone]  If 7PM-7AM, please contact night-coverage [email or web address] Password [facility] 01/05/2018, 10:38 AM

## 2018-01-06 LAB — BASIC METABOLIC PANEL
Anion gap: 10 (ref 5–15)
BUN: 29 mg/dL — ABNORMAL HIGH (ref 8–23)
CHLORIDE: 95 mmol/L — AB (ref 98–111)
CO2: 32 mmol/L (ref 22–32)
Calcium: 8.7 mg/dL — ABNORMAL LOW (ref 8.9–10.3)
Creatinine, Ser: 1.39 mg/dL — ABNORMAL HIGH (ref 0.44–1.00)
GFR calc Af Amer: 39 mL/min — ABNORMAL LOW (ref >60)
GFR calc non Af Amer: 33 mL/min — ABNORMAL LOW (ref >60)
Glucose, Bld: 131 mg/dL — ABNORMAL HIGH (ref 70–99)
Potassium: 3.8 mmol/L (ref 3.5–5.1)
Sodium: 137 mmol/L (ref 135–145)

## 2018-01-06 LAB — CBC
HEMATOCRIT: 35.7 % — AB (ref 36.0–46.0)
Hemoglobin: 11.1 g/dL — ABNORMAL LOW (ref 12.0–15.0)
MCH: 29.7 pg (ref 26.0–34.0)
MCHC: 31.1 g/dL (ref 30.0–36.0)
MCV: 95.5 fL (ref 80.0–100.0)
Platelets: 225 10*3/uL (ref 150–400)
RBC: 3.74 MIL/uL — ABNORMAL LOW (ref 3.87–5.11)
RDW: 12.1 % (ref 11.5–15.5)
WBC: 13.9 10*3/uL — ABNORMAL HIGH (ref 4.0–10.5)
nRBC: 0 % (ref 0.0–0.2)

## 2018-01-06 MED ORDER — FUROSEMIDE 40 MG PO TABS
40.0000 mg | ORAL_TABLET | Freq: Two times a day (BID) | ORAL | Status: DC
  Filled 2018-01-06: qty 1

## 2018-01-06 MED ORDER — FUROSEMIDE 40 MG PO TABS
60.0000 mg | ORAL_TABLET | Freq: Two times a day (BID) | ORAL | Status: DC
  Administered 2018-01-06 – 2018-01-07 (×3): 60 mg via ORAL
  Filled 2018-01-06 (×3): qty 1

## 2018-01-06 MED ORDER — TRAMADOL HCL 50 MG PO TABS
50.0000 mg | ORAL_TABLET | Freq: Once | ORAL | Status: AC
  Administered 2018-01-06: 50 mg via ORAL
  Filled 2018-01-06: qty 1

## 2018-01-06 MED ORDER — IPRATROPIUM-ALBUTEROL 0.5-2.5 (3) MG/3ML IN SOLN
3.0000 mL | Freq: Four times a day (QID) | RESPIRATORY_TRACT | Status: DC | PRN
  Administered 2018-01-07: 3 mL via RESPIRATORY_TRACT
  Filled 2018-01-06: qty 3

## 2018-01-06 MED ORDER — METHYLPREDNISOLONE SODIUM SUCC 125 MG IJ SOLR
60.0000 mg | Freq: Two times a day (BID) | INTRAMUSCULAR | Status: DC
  Administered 2018-01-06 – 2018-01-09 (×6): 60 mg via INTRAVENOUS
  Filled 2018-01-06 (×6): qty 2

## 2018-01-06 MED ORDER — FUROSEMIDE 40 MG PO TABS: 60.0000 mg | ORAL_TABLET | Freq: Two times a day (BID) | ORAL | Status: DC

## 2018-01-06 NOTE — Evaluation (Addendum)
Physical Therapy Evaluation Patient Details Name: [name] MRN: [id number] DOB: Feb 16, 1925 Today's Date: 01/06/2018   History of Present Illness  Patient is a 85 y/o female who presents with SOB, edema and cough. Admitted with respiratory distress secondary to COPD/CHF. CXR-vascular congestion. PMH includes HTN, paroxysmal A-fib, CHF, CVA, COPD.  Clinical Impression  Patient presents with dyspnea on exertion, decreased activity tolerance, impaired balance and impaired mobility s/p above. Tolerated transfers and gait training with MIn guard for safety. Sp02 dropped to 87% on 2L/min 02 requiring 1 seated and 1 standing rest break. Not able to walk and talk. 3/4 DOE. Encouraged ambulation with nursing daily. Pt lives with daughter and Mod I PTA. Will follow acutely to maximize independence and mobility prior to return home.     Follow Up Recommendations Home health PT;Supervision for mobility/OOB    Equipment Recommendations  None recommended by PT    Recommendations for Other Services       Precautions / Restrictions Precautions Precautions: Fall Precaution Comments: watch 02 Restrictions Weight Bearing Restrictions: No      Mobility  Bed Mobility               General bed mobility comments: Up in chair upon PT arrival.   Transfers Overall transfer level: Needs assistance Equipment used: None Transfers: Sit to/from Stand Sit to Stand: Min guard         General transfer comment: Min guard for safety. Stood from Youth worker.   Ambulation/Gait Ambulation/Gait assistance: Min guard Gait Distance (Feet): 120 Feet Assistive device: Rolling walker (2 wheeled) Gait Pattern/deviations: Step-through pattern;Decreased stride length;Trunk flexed Gait velocity: decreased   General Gait Details: Slow, mostly steady gait with RW for support; cues for RW proximity as RW too far anterior. 1 standing rest break, 1 seated rest break. Sp02 dropped to 87% on 2L/min 02. 3/4 DOE. NOt  able to walk and talk.  Stairs            Wheelchair Mobility    Modified Rankin (Stroke Patients Only)       Balance Overall balance assessment: Needs assistance Sitting-balance support: Feet supported;No upper extremity supported Sitting balance-Leahy Scale: Good     Standing balance support: During functional activity Standing balance-Leahy Scale: Fair Standing balance comment: Able to stand and donn robe without assist.                             Pertinent Vitals/Pain Pain Assessment: Faces Faces Pain Scale: Hurts little more Pain Location: right shoulder Pain Descriptors / Indicators: Aching Pain Intervention(s): Monitored during session;Repositioned    Home Living Family/patient expects to be discharged to:: Private residence Living Arrangements: Children(daughter) Available Help at Discharge: Family;Available 24 hours/day Type of Home: House Home Access: Stairs to enter Entrance Stairs-Rails: None Entrance Stairs-Number of Steps: 2 Home Layout: One level Home Equipment: Walker - 4 wheels;Bedside commode;Shower seat Additional Comments: on 2 L O2 at home at rest and 3L for mobility    Prior Function Level of Independence: Needs assistance   Gait / Transfers Assistance Needed: RW for mobility, Pt reports no falls.  ADL's / Homemaking Assistance Needed: modified independent with bathing and dressing. Son assists with meals.        Hand Dominance   Dominant Hand: Right    Extremity/Trunk Assessment   Upper Extremity Assessment Upper Extremity Assessment: Defer to OT evaluation    Lower Extremity Assessment Lower Extremity Assessment: Generalized weakness  Cervical / Trunk Assessment Cervical / Trunk Assessment: Kyphotic  Communication   Communication: No difficulties  Cognition Arousal/Alertness: Awake/alert Behavior During Therapy: WFL for tasks assessed/performed Overall Cognitive Status: Within Functional Limits for tasks  assessed                                 General Comments: for basic mobility tasks      General Comments      Exercises     Assessment/Plan    PT Assessment Patient needs continued PT services  PT Problem List Decreased strength;Decreased mobility;Decreased balance;Cardiopulmonary status limiting activity;Pain;Decreased activity tolerance;Decreased knowledge of use of DME       PT Treatment Interventions Functional mobility training;Balance training;Patient/family education;Therapeutic activities;Gait training;Therapeutic exercise;Stair training;DME instruction    PT Goals (Current goals can be found in the Care Plan section)  Acute Rehab PT Goals Patient Stated Goal: to get better and go home PT Goal Formulation: With patient Time For Goal Achievement: 01/20/18 Potential to Achieve Goals: Good    Frequency Min 3X/week   Barriers to discharge        Co-evaluation               AM-PAC PT "6 Clicks" Mobility  Outcome Measure Help needed turning from your back to your side while in a flat bed without using bedrails?: A Little Help needed moving from lying on your back to sitting on the side of a flat bed without using bedrails?: A Little Help needed moving to and from a bed to a chair (including a wheelchair)?: None Help needed standing up from a chair using your arms (e.g., wheelchair or bedside chair)?: None Help needed to walk in hospital room?: A Little Help needed climbing 3-5 steps with a railing? : A Little 6 Click Score: 20    End of Session Equipment Utilized During Treatment: Gait belt;Oxygen Activity Tolerance: Treatment limited secondary to medical complications (Comment)(drop in Sp02) Patient left: in chair;with call bell/phone within reach;with chair alarm set Nurse Communication: Mobility status PT Visit Diagnosis: Unsteadiness on feet (R26.81);Difficulty in walking, not elsewhere classified (R26.2)    Time: 8119-1478 PT Time  Calculation (min) (ACUTE ONLY): 20 min   Charges:   PT Evaluation $PT Eval Moderate Complexity: 1 Mod          Wray Kearns, PT, DPT Acute Rehabilitation Services Pager [phone] Office [place] 01/06/2018, 3:47 PM

## 2018-01-06 NOTE — Plan of Care (Signed)
  Problem: Clinical Measurements: Goal: Cardiovascular complication will be avoided Outcome: Progressing   Problem: Activity: Goal: Risk for activity intolerance will decrease Outcome: Progressing   Problem: Nutrition: Goal: Adequate nutrition will be maintained Outcome: Progressing   Problem: Elimination: Goal: Will not experience complications related to bowel motility Outcome: Progressing   Problem: Pain Managment: Goal: General experience of comfort will improve Outcome: Progressing   Problem: Safety: Goal: Ability to remain free from injury will improve Outcome: Progressing   Problem: Skin Integrity: Goal: Risk for impaired skin integrity will decrease Outcome: Progressing

## 2018-01-06 NOTE — Care Management Important Message (Signed)
Important Message  Patient Details  Name: [name] MRN: [id number] Date of Birth: 1925/01/29   Medicare Important Message Given:  Yes    Ranvir Renovato P Rosenda Geffrard 01/06/2018, 3:49 PM

## 2018-01-06 NOTE — Progress Notes (Signed)
Progress Note  Patient Name: [name] Date of Encounter: 01/06/2018  Primary Cardiologist: Mertie Moores, MD   Subjective   No CP. Dyspnea is slightly improving, but still visibly short of breath sitting in the chair.   Inpatient Medications    Scheduled Meds: . amoxicillin-clavulanate  1 tablet Oral Q24H  . apixaban  5 mg Oral BID  . ferrous sulfate  325 mg Oral BID WC  . furosemide  60 mg Oral BID  . ipratropium-albuterol  3 mL Nebulization BID  . loratadine  10 mg Oral Daily  . mouth rinse  15 mL Mouth Rinse BID  . mometasone-formoterol  2 puff Inhalation BID  . pantoprazole  40 mg Oral Daily  . spironolactone  12.5 mg Oral Daily   Continuous Infusions:  PRN Meds: acetaminophen **OR** acetaminophen, albuterol, docusate sodium, fluticasone, guaiFENesin-dextromethorphan, ondansetron **OR** ondansetron (ZOFRAN) IV, polyethylene glycol   Vital Signs    Vitals:   01/05/18 1946 01/06/18 0422 01/06/18 0835 01/06/18 0920  BP: (!) 94/48 121/69  (!) 127/52  Pulse: 83 75  85  Resp: 20 20    Temp: 98.3 F (36.8 C) 98 F (36.7 C)    TempSrc: Oral Oral    SpO2: 94% 97% 96% 94%  Weight:  68.1 kg    Height:        Intake/Output Summary (Last 24 hours) at 01/06/2018 0947 Last data filed at 01/06/2018 0841 Gross per 24 hour  Intake 720 ml  Output 1350 ml  Net -630 ml   Filed Weights   01/04/18 0426 01/05/18 0357 01/06/18 0422  Weight: 69.4 kg 67.6 kg 68.1 kg    Telemetry    Sinus with PVCs - Personally Reviewed  Physical Exam   Constitutional: No acute distress ENMT: moist mucous membranes Cardiovascular: regular rhythm, normal rate, no murmurs. S1 and S2 normal. Radial pulses normal bilaterally. No jugular venous distention.  Respiratory: rhonchi in right lung scattered, left lung diminished breath sounds. GI : normal bowel sounds, soft and nontender. No distention.   MSK: extremities warm, well perfused. Trace edema.  NEURO: grossly nonfocal exam, moves all  extremities. PSYCH: alert and oriented x 3, normal mood and affect.   Labs    Chemistry Recent Labs  Lab 01/02/18 2339  01/04/18 0542 01/05/18 0520 01/06/18 0621  NA 140   < > 140 139 137  K 4.0   < > 4.1 3.7 3.8  CL 102   < > 101 97* 95*  CO2 27   < > 30 32 PENDING  GLUCOSE 127*   < > 141* 127* 131*  BUN 17   < > 25* 24* 29*  CREATININE 1.05*   < > 1.09* 1.16* 1.39*  CALCIUM 8.9   < > 9.0 8.9 8.7*  PROT 6.6  --   --   --   --   ALBUMIN 3.4*  --   --   --   --   AST 65*  --   --   --   --   ALT 45*  --   --   --   --   ALKPHOS 90  --   --   --   --   BILITOT 1.4*  --   --   --   --   GFRNONAA 47*   < > 45* 41* 33*  GFRAA 54*   < > 52* 48* 39*  ANIONGAP 11   < > 9 10 PENDING   < > =  values in this interval not displayed.     Hematology Recent Labs  Lab 01/02/18 2339 01/03/18 0009 01/03/18 0344  WBC 9.5  --  9.4  RBC 2.97*  --  3.00*  HGB 11.0* 11.9* 10.4*  HCT 30.2* 35.0* 29.7*  MCV 101.7*  --  99.0  MCH 37.0*  --  34.7*  MCHC 36.4*  --  35.0  RDW 13.1  --  12.7  PLT 200  --  181    Cardiac Enzymes Recent Labs  Lab 01/03/18 0344 01/03/18 0908 01/03/18 1511  TROPONINI 0.23* 0.22* 0.28*    Recent Labs  Lab 01/03/18 0007  TROPIPOC 0.22*     BNP Recent Labs  Lab 01/02/18 2339  BNP 1,380.6*      Radiology    Dg Chest 2 View  Result Date: 01/04/2018 CLINICAL DATA:  Increasing shortness of breath. EXAM: CHEST - 2 VIEW COMPARISON:  Single-view of the chest 01/02/2018 and PA and lateral chest 11/10/2017. Soft tissue neck CT 10/22/2016. FINDINGS: The lungs are clear. The chest is hyperexpanded with some prominence of the pulmonary interstitium. Heart size is normal. Aortic atherosclerosis is seen. Bulky calcifications at the base of the neck are consistent with thyroid calcifications seen on prior neck CT. IMPRESSION: No acute disease. Findings compatible with COPD. Electronically Signed   By: Inge Rise M.D.   On: 01/04/2018 13:44    Patient  Profile     85 y.o. female with past medical history of chronic combined systolic/diastolic congestive heart failure, paroxysmal atrial fibrillation, nonsustained ventricular tachycardia, chronic stage III kidney disease, COPD on home oxygen for evaluation of acute on chronic combined systolic/diastolic congestive heart failure.  Last echocardiogram November 2019 showed an ejection fraction 30 to 44%, grade 1 diastolic dysfunction, mild mitral regurgitation and biatrial enlargement.  Assessment & Plan    1 acute on chronic combined systolic/diastolic congestive heart failure-weight recorded at 68.1 kg today, yesterday was 67.6.  I's and O's -530.  Total net -2.2 L.  Slowly improving.   -Nursing concerned about decreased output, we will bladder scan today.  Creatinine is rising, is 1.39 today.  We will transition to oral diuresis, slightly higher than her home dose.  We will give Lasix 60 mg p.o. twice daily.  I have asked the patient's nurse to call our team mid afternoon if the patient has not had adequate output with Lasix and continues to be visibly short of breath.  We can then consider IV diuresis, keeping in mind her renal function. - Needs fluid restriction to 1 L daily and low-sodium diet.  2 presumed nonischemic cardiomyopathy-ejection fraction 30 to 35%.  Blood pressure remains soft.  We have therefore not added an ARB or beta-blocker.  Continue Spironolactone at present dose.  Given age she is not a candidate for ICD.    3 home O2 dependent COPD-Per primary care.  4 elevated troponin-patient has not had chest pain.  No clear trend.  Would not pursue further ischemia evaluation.  5 paroxysmal atrial fibrillation-remains in sinus.  Continue apixaban.  6 history of nonsustained ventricular tachycardia-patient not on a beta-blocker as apparently she has had bradycardia and low blood pressure in the past.  For questions or updates, please contact [place] Please consult [email or web address]  for contact info under      Signed, Elouise Munroe, MD  01/06/2018, 9:47 AM

## 2018-01-06 NOTE — Progress Notes (Signed)
PROGRESS NOTE    [name]  PPI:[id number] DOB: [birth year] DOA: 01/02/2018 PCP: Leonard Downing, MD    Brief Narrative: [name] is a 85 y.o. female with history of chronic respiratory failure with COPD on home oxygen, chronic combined systolic and diastolic CHF last EF measured was around 35% with grade 1 diastolic dysfunction, chronic kidney disease stage III, chronic anemia was recently admitted a month and a half ago for CHF and COPD presents to the ER because of worsening shortness of breath even at rest for last 1 week. Assessment & Plan:   Active Problems:   Hypertension   H/O: hypothyroidism   Essential hypertension   PAF (paroxysmal atrial fibrillation) (HCC)   Acute on chronic systolic (congestive) heart failure (HCC)   NSVT (nonsustained ventricular tachycardia) (HCC)   Acute CHF (congestive heart failure) (HCC)   Acute respiratory failure with hypoxia probably secondary to acute on chronic systolic heart failure and mild COPD exacerbation Slow improvement. She was started on IV lasix 60 mg BID, but with her creatinine worsening, she was transitioned to oral lasix 60 mg BID.  Added spironolactone. Continue with strict intake and output and daily weights.  He has diuresed only about 1.2 LIT overnight.   Elevated troponins probably secondary to demand ischemia from CHF.   Paroxysmal atrial fibrillation Rate controlled and on Eliquis for anticoagulation   Mild COPD exacerbation Bilateral diffuse wheezing posteriorly and tachypnea, and coarse breath sounds , since she hasn't improved with oral prednisone, we will switch to IV solumedrol 60 mg q12 hours and watch her breathing.  Duo nebs ordered . Continue with dulera.  augmentin added for acute bronchitis and sputum cultures ordered.     Stage 3 CKD: Slight worsening of creatinine , probably from diuresis.  Continue to monitor.    Leukocytosis  Suspect from prednisone. She remains afebrile.     Hypertension Well-controlled    DVT prophylaxis: Eliquis  code Status: DNR Family Communication: None at bedside  disposition Plan: Pending clinical improvement   Consultants:   Cardiology  Procedures: None  Antimicrobials: Augmentin.    Subjective: Continues to be sob with tachypnea, but she reports breathing is better than the last two days.  No chest pain .   Objective: Vitals:   01/05/18 1946 01/06/18 0422 01/06/18 0835 01/06/18 0920  BP: (!) 94/48 121/69  (!) 127/52  Pulse: 83 75  85  Resp: 20 20    Temp: 98.3 F (36.8 C) 98 F (36.7 C)    TempSrc: Oral Oral    SpO2: 94% 97% 96% 94%  Weight:  68.1 kg    Height:        Intake/Output Summary (Last 24 hours) at 01/06/2018 1050 Last data filed at 01/06/2018 1000 Gross per 24 hour  Intake 960 ml  Output 1350 ml  Net -390 ml   Filed Weights   01/04/18 0426 01/05/18 0357 01/06/18 0422  Weight: 69.4 kg 67.6 kg 68.1 kg    Examination:  General exam: mild distress with tachypnea on talking.  Respiratory system: coarse breath sounds bilateral. Posterior exp wheezing heard.  Cardiovascular system: S1 & S2 heard, irregular,   Gastrointestinal system: Abdomen is soft, NT ND BS+ Central nervous system: Alert and oriented.  Grossly nonfocal Extremities: Symmetric 5 x 5 power. Skin: No rashes, lesions or ulcers Psychiatry:  Mood & affect appropriate.     Data Reviewed: I have personally reviewed following labs and imaging studies  CBC: Recent Labs  Lab  01/02/18 2339 01/03/18 0009 01/03/18 0344 01/06/18 0621  WBC 9.5  --  9.4 13.9*  NEUTROABS 7.4  --   --   --   HGB 11.0* 11.9* 10.4* 11.1*  HCT 30.2* 35.0* 29.7* 35.7*  MCV 101.7*  --  99.0 95.5  PLT 200  --  181 423   Basic Metabolic Panel: Recent Labs  Lab 01/02/18 2339 01/03/18 0009 01/03/18 0344 01/04/18 0542 01/05/18 0520 01/06/18 0621  NA 140 139 140 140 139 137  K 4.0 4.1 3.5 4.1 3.7 3.8  CL 102 102 104 101 97* 95*  CO2 27  --  24  30 32 32  GLUCOSE 127* 121* 196* 141* 127* 131*  BUN 17 26* 21 25* 24* 29*  CREATININE 1.05* 0.90 1.16* 1.09* 1.16* 1.39*  CALCIUM 8.9  --  8.7* 9.0 8.9 8.7*  MG  --   --  1.9  --   --   --    GFR: Estimated Creatinine Clearance: 23.1 mL/min (A) (by C-G formula based on SCr of 1.39 mg/dL (H)). Liver Function Tests: Recent Labs  Lab 01/02/18 2339  AST 65*  ALT 45*  ALKPHOS 90  BILITOT 1.4*  PROT 6.6  ALBUMIN 3.4*   No results for input(s): LIPASE, AMYLASE in the last 168 hours. No results for input(s): AMMONIA in the last 168 hours. Coagulation Profile: No results for input(s): INR, PROTIME in the last 168 hours. Cardiac Enzymes: Recent Labs  Lab 01/03/18 0344 01/03/18 0908 01/03/18 1511  TROPONINI 0.23* 0.22* 0.28*   BNP (last 3 results) No results for input(s): PROBNP in the last 8760 hours. HbA1C: No results for input(s): HGBA1C in the last 72 hours. CBG: No results for input(s): GLUCAP in the last 168 hours. Lipid Profile: No results for input(s): CHOL, HDL, LDLCALC, TRIG, CHOLHDL, LDLDIRECT in the last 72 hours. Thyroid Function Tests: No results for input(s): TSH, T4TOTAL, FREET4, T3FREE, THYROIDAB in the last 72 hours. Anemia Panel: No results for input(s): VITAMINB12, FOLATE, FERRITIN, TIBC, IRON, RETICCTPCT in the last 72 hours. Sepsis Labs: No results for input(s): PROCALCITON, LATICACIDVEN in the last 168 hours.  Recent Results (from the past 240 hour(s))  Expectorated sputum assessment w rflx to resp cult     Status: None   Collection Time: 01/05/18 10:42 AM  Result Value Ref Range Status   Specimen Description EXPECTORATED SPUTUM  Final   Special Requests NONE  Final   Sputum evaluation   Final    THIS SPECIMEN IS ACCEPTABLE FOR SPUTUM CULTURE Performed at [facility], [address], [place], [place] [address]    Report Status 01/05/2018 FINAL  Final  Culture, respiratory     Status: None (Preliminary result)   Collection Time: 01/05/18  10:42 AM  Result Value Ref Range Status   Specimen Description EXPECTORATED SPUTUM  Final   Special Requests NONE Reflexed from [id number]  Final   Gram Stain   Final    ABUNDANT WBC PRESENT, PREDOMINANTLY PMN ABUNDANT GRAM POSITIVE COCCI    Culture   Final    CULTURE REINCUBATED FOR BETTER GROWTH Performed at [facility], [place] [address] [place], [place] [address]    Report Status PENDING  Incomplete         Radiology Studies: Dg Chest 2 View  Result Date: 01/04/2018 CLINICAL DATA:  Increasing shortness of breath. EXAM: CHEST - 2 VIEW COMPARISON:  Single-view of the chest 01/02/2018 and PA and lateral chest 11/10/2017. Soft tissue neck CT  10/22/2016. FINDINGS: The lungs are clear. The chest is hyperexpanded with some prominence of the pulmonary interstitium. Heart size is normal. Aortic atherosclerosis is seen. Bulky calcifications at the base of the neck are consistent with thyroid calcifications seen on prior neck CT. IMPRESSION: No acute disease. Findings compatible with COPD. Electronically Signed   By: Inge Rise M.D.   On: 01/04/2018 13:44        Scheduled Meds: . amoxicillin-clavulanate  1 tablet Oral Q24H  . apixaban  5 mg Oral BID  . ferrous sulfate  325 mg Oral BID WC  . furosemide  60 mg Oral BID  . ipratropium-albuterol  3 mL Nebulization BID  . loratadine  10 mg Oral Daily  . mouth rinse  15 mL Mouth Rinse BID  . mometasone-formoterol  2 puff Inhalation BID  . pantoprazole  40 mg Oral Daily  . spironolactone  12.5 mg Oral Daily   Continuous Infusions:   LOS: 3 days    Time spent: 35  minutes.     Hosie Poisson, MD [facility] Pager [phone]  If 7PM-7AM, please contact night-coverage [email or web address] Password [email or web address] 01/06/2018, 10:50 AM

## 2018-01-06 NOTE — Progress Notes (Signed)
Dr Margaretann Loveless requested update after changing to po lasix today, paged Rae Halsted pa, pt has 456ml with 2BM after po lasix, pt bladder scan was 74ml  Post void, pt also got dose of iv solumedrol an less sob noted at rest and when pt transfer to bsc. Pt verbalized feeling better "  I feel pretty good right now. I took a nap and got some rest too" pt says she walked with therapy and felt good

## 2018-01-07 DIAGNOSIS — I5043 Acute on chronic combined systolic (congestive) and diastolic (congestive) heart failure: Secondary | ICD-10-CM

## 2018-01-07 LAB — BASIC METABOLIC PANEL
Anion gap: 8 (ref 5–15)
BUN: 34 mg/dL — ABNORMAL HIGH (ref 8–23)
CO2: 34 mmol/L — ABNORMAL HIGH (ref 22–32)
Calcium: 8.9 mg/dL (ref 8.9–10.3)
Chloride: 97 mmol/L — ABNORMAL LOW (ref 98–111)
Creatinine, Ser: 1.28 mg/dL — ABNORMAL HIGH (ref 0.44–1.00)
GFR calc Af Amer: 43 mL/min — ABNORMAL LOW (ref >60)
GFR calc non Af Amer: 37 mL/min — ABNORMAL LOW (ref >60)
Glucose, Bld: 161 mg/dL — ABNORMAL HIGH (ref 70–99)
POTASSIUM: 4.8 mmol/L (ref 3.5–5.1)
Sodium: 139 mmol/L (ref 135–145)

## 2018-01-07 LAB — CULTURE, RESPIRATORY W GRAM STAIN: Culture: NORMAL

## 2018-01-07 LAB — MAGNESIUM: Magnesium: 2.2 mg/dL (ref 1.7–2.4)

## 2018-01-07 MED ORDER — FUROSEMIDE 40 MG PO TABS
40.0000 mg | ORAL_TABLET | Freq: Two times a day (BID) | ORAL | Status: DC
  Administered 2018-01-07 – 2018-01-09 (×4): 40 mg via ORAL
  Filled 2018-01-07 (×4): qty 1

## 2018-01-07 NOTE — Progress Notes (Signed)
Physical Therapy Treatment Patient Details Name: [name] MRN: [id number] DOB: November 23, 1925 Today's Date: 01/07/2018    History of Present Illness Patient is a 85 y/o female who presents with SOB, edema and cough. Admitted with respiratory distress secondary to COPD/CHF. CXR-vascular congestion. PMH includes HTN, paroxysmal A-fib, CHF, CVA, COPD.    PT Comments    Patient progressing slowly towards PT goals. Improved ambulation today with Min guard for safety. Breathing seemed improved as well but still not at baseline as pt fatigues after 140' and requires seated rest break. Sp02 ranged from 88-90% during mobility with 2/4 DOE. Able to walk and talk a little better today from prior session. Pt eager to sit at sink and do wash up so wanted to save energy. Will continue to follow and progress as tolerated.   Follow Up Recommendations  Home health PT;Supervision for mobility/OOB     Equipment Recommendations  None recommended by PT    Recommendations for Other Services       Precautions / Restrictions Precautions Precautions: Fall Precaution Comments: watch 02 Restrictions Weight Bearing Restrictions: No    Mobility  Bed Mobility               General bed mobility comments: Up in chair upon PT arrival.   Transfers Overall transfer level: Needs assistance Equipment used: Rolling walker (2 wheeled) Transfers: Sit to/from Stand Sit to Stand: Supervision         General transfer comment: Supervision for safety. Stood from Youth worker.   Ambulation/Gait Ambulation/Gait assistance: Min guard Gait Distance (Feet): 140 Feet Assistive device: Rolling walker (2 wheeled) Gait Pattern/deviations: Step-through pattern;Decreased stride length;Trunk flexed Gait velocity: decreased   General Gait Details: Slow, mostly steady gait with cues for RW proximity. 1 standing rest break. 2/4 DOE. Sp02 ranged from 88-90% on 2L/min 02.   Stairs             Wheelchair  Mobility    Modified Rankin (Stroke Patients Only)       Balance Overall balance assessment: Needs assistance Sitting-balance support: Feet supported;No upper extremity supported Sitting balance-Leahy Scale: Good     Standing balance support: No upper extremity supported Standing balance-Leahy Scale: Fair                              Cognition Arousal/Alertness: Awake/alert Behavior During Therapy: WFL for tasks assessed/performed Overall Cognitive Status: Within Functional Limits for tasks assessed                                 General Comments: for basic mobility tasks      Exercises      General Comments        Pertinent Vitals/Pain Pain Assessment: No/denies pain    Home Living                      Prior Function            PT Goals (current goals can now be found in the care plan section) Progress towards PT goals: Progressing toward goals    Frequency    Min 3X/week      PT Plan Current plan remains appropriate    Co-evaluation              AM-PAC PT "6 Clicks" Mobility   Outcome Measure  Help needed turning  from your back to your side while in a flat bed without using bedrails?: A Little Help needed moving from lying on your back to sitting on the side of a flat bed without using bedrails?: A Little Help needed moving to and from a bed to a chair (including a wheelchair)?: None Help needed standing up from a chair using your arms (e.g., wheelchair or bedside chair)?: None Help needed to walk in hospital room?: A Little Help needed climbing 3-5 steps with a railing? : A Little 6 Click Score: 20    End of Session Equipment Utilized During Treatment: Gait belt;Oxygen Activity Tolerance: Patient tolerated treatment well Patient left: in chair;with call bell/phone within reach(left stting in straight back chair at sink to prepare for wash up with RN) Nurse Communication: Mobility status;Other  (comment)(02 level) PT Visit Diagnosis: Unsteadiness on feet (R26.81);Difficulty in walking, not elsewhere classified (R26.2)     Time: 8757-9728 PT Time Calculation (min) (ACUTE ONLY): 13 min  Charges:  $Gait Training: 8-22 mins                     Wray Kearns, PT, DPT Acute Rehabilitation Services Pager [phone] Office [phone]       [place] 01/07/2018, 11:52 AM

## 2018-01-07 NOTE — Plan of Care (Signed)
  Problem: Education: Goal: Knowledge of General Education information will improve Description Including pain rating scale, medication(s)/side effects and non-pharmacologic comfort measures Outcome: Progressing   Problem: Health Behavior/Discharge Planning: Goal: Ability to manage health-related needs will improve Outcome: Progressing   Problem: Clinical Measurements: Goal: Ability to maintain clinical measurements within normal limits will improve Outcome: Progressing Goal: Will remain free from infection Outcome: Progressing Goal: Diagnostic test results will improve Outcome: Progressing Goal: Respiratory complications will improve Outcome: Progressing Goal: Cardiovascular complication will be avoided Outcome: Progressing   Problem: Activity: Goal: Risk for activity intolerance will decrease Outcome: Progressing   Problem: Nutrition: Goal: Adequate nutrition will be maintained Outcome: Progressing   Problem: Elimination: Goal: Will not experience complications related to bowel motility Outcome: Progressing Goal: Will not experience complications related to urinary retention Outcome: Progressing   Problem: Pain Managment: Goal: General experience of comfort will improve Outcome: Progressing   Problem: Safety: Goal: Ability to remain free from injury will improve Outcome: Progressing   Problem: Skin Integrity: Goal: Risk for impaired skin integrity will decrease Outcome: Progressing   Problem: Education: Goal: Ability to demonstrate management of disease process will improve Outcome: Progressing Goal: Ability to verbalize understanding of medication therapies will improve Outcome: Progressing   Problem: Activity: Goal: Capacity to carry out activities will improve Outcome: Progressing   Problem: Cardiac: Goal: Ability to achieve and maintain adequate cardiopulmonary perfusion will improve Outcome: Progressing

## 2018-01-07 NOTE — Progress Notes (Signed)
PROGRESS NOTE    [name]  KYH:[id number] DOB: 1925-06-14 DOA: 01/02/2018 PCP: Leonard Downing, MD    Brief Narrative: [name] is a 85 y.o. female with history of chronic respiratory failure with COPD on home oxygen, chronic combined systolic and diastolic CHF last EF measured was around 35% with grade 1 diastolic dysfunction, chronic kidney disease stage III, chronic anemia was recently admitted a month and a half ago for CHF and COPD presents to the ER because of worsening shortness of breath even at rest for last 1 week. Assessment & Plan:   Active Problems:   Hypertension   H/O: hypothyroidism   Essential hypertension   PAF (paroxysmal atrial fibrillation) (HCC)   Acute on chronic systolic (congestive) heart failure (HCC)   NSVT (nonsustained ventricular tachycardia) (HCC)   Acute CHF (congestive heart failure) (HCC)   Acute respiratory failure with hypoxia probably secondary to acute on chronic systolic heart failure and mild COPD exacerbation Slow improvement. She was started on IV lasix 60 mg BID, but with her creatinine worsening, she was transitioned to oral lasix 60 mg BID. Spironolactone was added to her regimen.  Continue with strict intake and output and daily weights.  He has diuresed only about 875 ml overnight.  Fluid and salt restriction recommended.    Elevated troponins probably secondary to demand ischemia from CHF.   Paroxysmal atrial fibrillation Rate controlled and on Eliquis for anticoagulation   Mild COPD exacerbation No wheezing today, coarse breath sounds, recommend to continue with IV solumedrol for another 24 hours , and switch to oral prednisone taper.  Continue with dulera and duonebs .  Augmentin added for acute bronchitis and sputum cultures ordered, showed normal flora.     Acute on Stage 3 CKD: Slight worsening of creatinine , probably from diuresis. Her creatinine trend has been 1.09<1.16<1.39. after we changed to oral lasix  , her creatinine improved to 1. 28.  Continue to monitor.    Leukocytosis  Suspect from prednisone. She remains afebrile.    Hypertension Well-controlled    DVT prophylaxis: Eliquis  code Status: DNR Family Communication: None at bedside  disposition Plan: Pending clinical improvement   Consultants:   Cardiology  Procedures: None  Antimicrobials: Augmentin.    Subjective: No chest pain, sob has improved, cough has improved.  No wheezing heard today. No nausea, vomiting or abdominal pain.   Objective: Vitals:   01/06/18 1942 01/06/18 2130 01/07/18 0458 01/07/18 0804  BP: (!) 107/46  113/68   Pulse: 70  66   Resp: 20  20   Temp: 98.2 F (36.8 C)  (!) 97.4 F (36.3 C)   TempSrc: Oral  Oral   SpO2: 95% 95% 96% 96%  Weight:   68.2 kg   Height:        Intake/Output Summary (Last 24 hours) at 01/07/2018 0902 Last data filed at 01/07/2018 0700 Gross per 24 hour  Intake 700 ml  Output 775 ml  Net -75 ml   Filed Weights   01/05/18 0357 01/06/18 0422 01/07/18 0458  Weight: 67.6 kg 68.1 kg 68.2 kg    Examination:  General exam: no distress noted on 2l it of NCoxygen.  Respiratory system: no wheezing heard. Coarse breath sounds bilateral.   Cardiovascular system: S1 & S2 heard, irregular, no JVD.   Gastrointestinal system: Abdomen is soft, NT ND BS+ Central nervous system: Alert and oriented.  Grossly nonfocal Extremities: trace edema.  Skin: No rashes, lesions or ulcers Psychiatry:  Mood & affect appropriate.     Data Reviewed: I have personally reviewed following labs and imaging studies  CBC: Recent Labs  Lab 01/02/18 2339 01/03/18 0009 01/03/18 0344 01/06/18 0621  WBC 9.5  --  9.4 13.9*  NEUTROABS 7.4  --   --   --   HGB 11.0* 11.9* 10.4* 11.1*  HCT 30.2* 35.0* 29.7* 35.7*  MCV 101.7*  --  99.0 95.5  PLT 200  --  181 852   Basic Metabolic Panel: Recent Labs  Lab 01/03/18 0344 01/04/18 0542 01/05/18 0520 01/06/18 0621 01/07/18 0705  NA  140 140 139 137 139  K 3.5 4.1 3.7 3.8 4.8  CL 104 101 97* 95* 97*  CO2 24 30 32 32 34*  GLUCOSE 196* 141* 127* 131* 161*  BUN 21 25* 24* 29* 34*  CREATININE 1.16* 1.09* 1.16* 1.39* 1.28*  CALCIUM 8.7* 9.0 8.9 8.7* 8.9  MG 1.9  --   --   --  2.2   GFR: Estimated Creatinine Clearance: 25.2 mL/min (A) (by C-G formula based on SCr of 1.28 mg/dL (H)). Liver Function Tests: Recent Labs  Lab 01/02/18 2339  AST 65*  ALT 45*  ALKPHOS 90  BILITOT 1.4*  PROT 6.6  ALBUMIN 3.4*   No results for input(s): LIPASE, AMYLASE in the last 168 hours. No results for input(s): AMMONIA in the last 168 hours. Coagulation Profile: No results for input(s): INR, PROTIME in the last 168 hours. Cardiac Enzymes: Recent Labs  Lab 01/03/18 0344 01/03/18 0908 01/03/18 1511  TROPONINI 0.23* 0.22* 0.28*   BNP (last 3 results) No results for input(s): PROBNP in the last 8760 hours. HbA1C: No results for input(s): HGBA1C in the last 72 hours. CBG: No results for input(s): GLUCAP in the last 168 hours. Lipid Profile: No results for input(s): CHOL, HDL, LDLCALC, TRIG, CHOLHDL, LDLDIRECT in the last 72 hours. Thyroid Function Tests: No results for input(s): TSH, T4TOTAL, FREET4, T3FREE, THYROIDAB in the last 72 hours. Anemia Panel: No results for input(s): VITAMINB12, FOLATE, FERRITIN, TIBC, IRON, RETICCTPCT in the last 72 hours. Sepsis Labs: No results for input(s): PROCALCITON, LATICACIDVEN in the last 168 hours.  Recent Results (from the past 240 hour(s))  Expectorated sputum assessment w rflx to resp cult     Status: None   Collection Time: 01/05/18 10:42 AM  Result Value Ref Range Status   Specimen Description EXPECTORATED SPUTUM  Final   Special Requests NONE  Final   Sputum evaluation   Final    THIS SPECIMEN IS ACCEPTABLE FOR SPUTUM CULTURE Performed at [facility], [address], [place], [place] [address]    Report Status 01/05/2018 FINAL  Final  Culture, respiratory      Status: None   Collection Time: 01/05/18 10:42 AM  Result Value Ref Range Status   Specimen Description EXPECTORATED SPUTUM  Final   Special Requests NONE Reflexed from [id number]  Final   Gram Stain   Final    ABUNDANT WBC PRESENT, PREDOMINANTLY PMN ABUNDANT GRAM POSITIVE COCCI    Culture   Final    Consistent with normal respiratory flora. Performed at [facility], [place] [address] [place], [place] [address]    Report Status 01/07/2018 FINAL  Final         Radiology Studies: No results found.      Scheduled Meds: . amoxicillin-clavulanate  1 tablet Oral Q24H  . apixaban  5 mg Oral BID  . ferrous sulfate  325 mg  Oral BID WC  . furosemide  60 mg Oral BID  . loratadine  10 mg Oral Daily  . mouth rinse  15 mL Mouth Rinse BID  . methylPREDNISolone (SOLU-MEDROL) injection  60 mg Intravenous Q12H  . mometasone-formoterol  2 puff Inhalation BID  . pantoprazole  40 mg Oral Daily  . spironolactone  12.5 mg Oral Daily   Continuous Infusions:   LOS: 4 days    Time spent: 32  minutes.     Hosie Poisson, MD [facility] Pager [phone]  If 7PM-7AM, please contact night-coverage [email or web address] Password [email or web address] 01/07/2018, 9:02 AM

## 2018-01-07 NOTE — Progress Notes (Addendum)
Progress Note  Patient Name: [name] Date of Encounter: 01/07/2018  Primary Cardiologist: Mertie Moores, MD   Subjective   No CP. Dyspnea improved.  Inpatient Medications    Scheduled Meds: . amoxicillin-clavulanate  1 tablet Oral Q24H  . apixaban  5 mg Oral BID  . ferrous sulfate  325 mg Oral BID WC  . furosemide  40 mg Oral BID  . loratadine  10 mg Oral Daily  . mouth rinse  15 mL Mouth Rinse BID  . methylPREDNISolone (SOLU-MEDROL) injection  60 mg Intravenous Q12H  . mometasone-formoterol  2 puff Inhalation BID  . pantoprazole  40 mg Oral Daily  . spironolactone  12.5 mg Oral Daily   Continuous Infusions:  PRN Meds: acetaminophen **OR** acetaminophen, docusate sodium, fluticasone, guaiFENesin-dextromethorphan, ipratropium-albuterol, ondansetron **OR** ondansetron (ZOFRAN) IV, polyethylene glycol   Vital Signs    Vitals:   01/06/18 1942 01/06/18 2130 01/07/18 0458 01/07/18 0804  BP: (!) 107/46  113/68   Pulse: 70  66   Resp: 20  20   Temp: 98.2 F (36.8 C)  (!) 97.4 F (36.3 C)   TempSrc: Oral  Oral   SpO2: 95% 95% 96% 96%  Weight:   68.2 kg   Height:        Intake/Output Summary (Last 24 hours) at 01/07/2018 1105 Last data filed at 01/07/2018 0944 Gross per 24 hour  Intake 680 ml  Output 775 ml  Net -95 ml   Filed Weights   01/05/18 0357 01/06/18 0422 01/07/18 0458  Weight: 67.6 kg 68.1 kg 68.2 kg    Telemetry    Sinus with PVCs, 1 20 beat run of NSVT - Personally Reviewed  Physical Exam   Constitutional: No acute distress ENMT: moist mucous membranes Cardiovascular: regular rhythm, normal rate, no murmurs. S1 and S2 normal. Radial pulses normal bilaterally. No jugular venous distention.  Respiratory: rhonchi in right lung scattered, left lung diminished breath sounds. GI : normal bowel sounds, soft and nontender. No distention.   MSK: extremities warm, well perfused. Trace edema.  NEURO: grossly nonfocal exam, moves all extremities. PSYCH:  alert and oriented x 3, normal mood and affect.   Labs    Chemistry Recent Labs  Lab 01/02/18 2339  01/05/18 0520 01/06/18 0621 01/07/18 0705  NA 140   < > 139 137 139  K 4.0   < > 3.7 3.8 4.8  CL 102   < > 97* 95* 97*  CO2 27   < > 32 32 34*  GLUCOSE 127*   < > 127* 131* 161*  BUN 17   < > 24* 29* 34*  CREATININE 1.05*   < > 1.16* 1.39* 1.28*  CALCIUM 8.9   < > 8.9 8.7* 8.9  PROT 6.6  --   --   --   --   ALBUMIN 3.4*  --   --   --   --   AST 65*  --   --   --   --   ALT 45*  --   --   --   --   ALKPHOS 90  --   --   --   --   BILITOT 1.4*  --   --   --   --   GFRNONAA 47*   < > 41* 33* 37*  GFRAA 54*   < > 48* 39* 43*  ANIONGAP 11   < > 10 10 8    < > = values in this  interval not displayed.     Hematology Recent Labs  Lab 01/02/18 2339 01/03/18 0009 01/03/18 0344 01/06/18 0621  WBC 9.5  --  9.4 13.9*  RBC 2.97*  --  3.00* 3.74*  HGB 11.0* 11.9* 10.4* 11.1*  HCT 30.2* 35.0* 29.7* 35.7*  MCV 101.7*  --  99.0 95.5  MCH 37.0*  --  34.7* 29.7  MCHC 36.4*  --  35.0 31.1  RDW 13.1  --  12.7 12.1  PLT 200  --  181 225    Cardiac Enzymes Recent Labs  Lab 01/03/18 0344 01/03/18 0908 01/03/18 1511  TROPONINI 0.23* 0.22* 0.28*    Recent Labs  Lab 01/03/18 0007  TROPIPOC 0.22*     BNP Recent Labs  Lab 01/02/18 2339  BNP 1,380.6*      Radiology    No results found.  Patient Profile     85 y.o. female with past medical history of chronic combined systolic/diastolic congestive heart failure, paroxysmal atrial fibrillation, nonsustained ventricular tachycardia, chronic stage III kidney disease, COPD on home oxygen for evaluation of acute on chronic combined systolic/diastolic congestive heart failure.  Last echocardiogram November 2019 showed an ejection fraction 30 to 95%, grade 1 diastolic dysfunction, mild mitral regurgitation and biatrial enlargement.  Assessment & Plan    1 acute on chronic combined systolic/diastolic congestive heart failure.   Total net -2.1 L.   - she is improved today and can return to home dose of lasix 40 mg BID. - Needs fluid restriction to 1 L daily and low-sodium diet.  2 presumed nonischemic cardiomyopathy-ejection fraction 30 to 35%.  Blood pressure remains soft.  We have therefore not added an ARB or beta-blocker.  Continue Spironolactone at present dose.  Given age she is not a candidate for ICD.    3 home O2 dependent COPD-Per primary care.  4 elevated troponin-patient has not had chest pain.  No clear trend.  Would not pursue further ischemia evaluation.  5 paroxysmal atrial fibrillation-remains in sinus.  Continue apixaban.  6 history of nonsustained ventricular tachycardia-patient not on a beta-blocker as apparently she has had bradycardia and low blood pressure in the past.  [facility] will sign off.   Medication Recommendations:  Continue home medications with addition of spironolactone 12.5 mg daily. Other recommendations (labs, testing, etc): -  Follow up as an outpatient:  Cardiology follow up 1 mo.       Signed, Elouise Munroe, MD  01/07/2018, 11:05 AM

## 2018-01-07 NOTE — Plan of Care (Signed)
  Problem: Clinical Measurements: Goal: Respiratory complications will improve Outcome: Progressing Goal: Cardiovascular complication will be avoided Outcome: Progressing   Problem: Activity: Goal: Risk for activity intolerance will decrease Outcome: Progressing   Problem: Pain Managment: Goal: General experience of comfort will improve Outcome: Progressing   Problem: Safety: Goal: Ability to remain free from injury will improve Outcome: Progressing   Problem: Skin Integrity: Goal: Risk for impaired skin integrity will decrease Outcome: Progressing   

## 2018-01-07 NOTE — Progress Notes (Signed)
   01/07/18 0458  Vitals  Temp (!) 97.4 F (36.3 C)  Temp Source Oral  BP 113/68  MAP (mmHg) 83  BP Location Right Arm  BP Method Automatic  Patient Position (if appropriate) Sitting  Pulse Rate 66  Resp 20  Oxygen Therapy  SpO2 96 %  O2 Device Nasal Cannula  Provider Notification  Provider Name/Title Baltazar Najjar  Date Provider Notified 01/07/18  Time Provider Notified 0500  Notification Type Page  Notification Reason Other (Comment) (21 beats vtach)  Pt with 21 beats vtach.  Pt asymptomatic.

## 2018-01-08 DIAGNOSIS — J441 Chronic obstructive pulmonary disease with (acute) exacerbation: Secondary | ICD-10-CM

## 2018-01-08 MED ORDER — LEVALBUTEROL HCL 1.25 MG/0.5ML IN NEBU: 1.2500 mg | INHALATION_SOLUTION | Freq: Three times a day (TID) | RESPIRATORY_TRACT | Status: DC

## 2018-01-08 MED ORDER — IPRATROPIUM BROMIDE 0.02 % IN SOLN
0.5000 mg | Freq: Three times a day (TID) | RESPIRATORY_TRACT | Status: DC
  Administered 2018-01-08 – 2018-01-09 (×2): 0.5 mg via RESPIRATORY_TRACT
  Filled 2018-01-08 (×2): qty 2.5

## 2018-01-08 MED ORDER — LEVALBUTEROL HCL 1.25 MG/0.5ML IN NEBU
1.2500 mg | INHALATION_SOLUTION | Freq: Three times a day (TID) | RESPIRATORY_TRACT | Status: DC
  Administered 2018-01-08 – 2018-01-09 (×2): 1.25 mg via RESPIRATORY_TRACT
  Filled 2018-01-08 (×2): qty 0.5

## 2018-01-08 MED ORDER — IPRATROPIUM BROMIDE 0.02 % IN SOLN: 0.5000 mg | Freq: Four times a day (QID) | RESPIRATORY_TRACT | Status: DC

## 2018-01-08 NOTE — Progress Notes (Signed)
CCMD called to report 15-20 bt vtach, pt asx up in chair, paged Dr Reesa Chew

## 2018-01-08 NOTE — Progress Notes (Signed)
PROGRESS NOTE    [name]  LTJ:[id number] DOB: January 04, 1925 DOA: 01/02/2018 PCP: Leonard Downing, MD   Brief Narrative:  85 year old female with chronic respiratory failure.  On 2 L nasal cannula at home, compliant talk CHF with ejection fraction 07% grade 1 diastolic dysfunction, CKD the worsening of shortness of breath for about 1 week.  She was diagnosed with CHF and COPD exacerbation   Assessment & Plan:   Active Problems:   Hypertension   H/O: hypothyroidism   Essential hypertension   PAF (paroxysmal atrial fibrillation) (HCC)   Acute on chronic systolic (congestive) heart failure (HCC)   NSVT (nonsustained ventricular tachycardia) (HCC)   Acute CHF (congestive heart failure) (HCC)   Acute on chronic combined systolic and diastolic congestive heart failure (HCC)  Acute hypoxic respiratory distress with abnormal breath sounds Acute on chronic systolic CHF Acute mild COPD exacerbation -Continue IV Solu-Medrol.  Will add Xopenex scheduled -Incentive spirometry bladder bowel - Continue home bronchodilators - Transition to oral diuretics Aldactone 12.5 mg daily added appreciate cardiology input -Continue Augmentin  Paroxysmal atrial fibrillation - Sinus rhythm, continue Eliquis.  Chronic kidney disease stage III -Creatinine stable around 1.2, closely monitor this  Leukocytosis - Continue Solu-Medrol IV today, plans on transitioning to oral tomorrow  Essential hypertension -Continue Lasix 40 mg twice daily, Aldactone 12.5 mg daily   DVT prophylaxis: Eliquis Code Status: Full code Family Communication: Son at bedside Disposition Plan: Breath sounds are better and able to ambulate without any issues.  Consultants:   Cardiology  Procedures:   None  Antimicrobials:   Augmentin    Subjective: She is still getting quite sob with some exertion.   Review of Systems Otherwise negative except as per HPI, including: General: Denies fever, chills, night  sweats or unintended weight loss. Resp: Denies cough, wheezing Cardiac: Denies chest pain, palpitations, orthopnea, paroxysmal nocturnal dyspnea. GI: Denies abdominal pain, nausea, vomiting, diarrhea or constipation GU: Denies dysuria, frequency, hesitancy or incontinence MS: Denies muscle aches, joint pain or swelling Neuro: Denies headache, neurologic deficits (focal weakness, numbness, tingling), abnormal gait Psych: Denies anxiety, depression, SI/HI/AVH Skin: Denies new rashes or lesions ID: Denies sick contacts, exotic exposures, travel  Objective: Vitals:   01/07/18 2031 01/08/18 0359 01/08/18 0741 01/08/18 1136  BP:  119/69 129/82 128/84  Pulse:  (!) 104 68 76  Resp:  19 20 20   Temp:  98.5 F (36.9 C) 97.6 F (36.4 C) (!) 97.2 F (36.2 C)  TempSrc:  Oral Oral Oral  SpO2: 95% 95% 97% 96%  Weight:  68.5 kg    Height:        Intake/Output Summary (Last 24 hours) at 01/08/2018 1539 Last data filed at 01/08/2018 1354 Gross per 24 hour  Intake 1200 ml  Output 1000 ml  Net 200 ml   Filed Weights   01/06/18 0422 01/07/18 0458 01/08/18 0359  Weight: 68.1 kg 68.2 kg 68.5 kg    Examination:  General exam: Appears calm and comfortable  Respiratory system: diffuse corase BS  Cardiovascular system: S1 & S2 heard, RRR. No JVD, murmurs, rubs, gallops or clicks. No pedal edema. Gastrointestinal system: Abdomen is nondistended, soft and nontender. No organomegaly or masses felt. Normal bowel sounds heard. Central nervous system: Alert and oriented. No focal neurological deficits. Extremities: Symmetric 5 x 5 power. Skin: No rashes, lesions or ulcers Psychiatry: Judgement and insight appear normal. Mood & affect appropriate.     Data Reviewed:   CBC: Recent Labs  Lab 01/02/18 2339  01/03/18 0009 01/03/18 0344 01/06/18 0621  WBC 9.5  --  9.4 13.9*  NEUTROABS 7.4  --   --   --   HGB 11.0* 11.9* 10.4* 11.1*  HCT 30.2* 35.0* 29.7* 35.7*  MCV 101.7*  --  99.0 95.5  PLT 200   --  181 284   Basic Metabolic Panel: Recent Labs  Lab 01/03/18 0344 01/04/18 0542 01/05/18 0520 01/06/18 0621 01/07/18 0705  NA 140 140 139 137 139  K 3.5 4.1 3.7 3.8 4.8  CL 104 101 97* 95* 97*  CO2 24 30 32 32 34*  GLUCOSE 196* 141* 127* 131* 161*  BUN 21 25* 24* 29* 34*  CREATININE 1.16* 1.09* 1.16* 1.39* 1.28*  CALCIUM 8.7* 9.0 8.9 8.7* 8.9  MG 1.9  --   --   --  2.2   GFR: Estimated Creatinine Clearance: 25.2 mL/min (A) (by C-G formula based on SCr of 1.28 mg/dL (H)). Liver Function Tests: Recent Labs  Lab 01/02/18 2339  AST 65*  ALT 45*  ALKPHOS 90  BILITOT 1.4*  PROT 6.6  ALBUMIN 3.4*   No results for input(s): LIPASE, AMYLASE in the last 168 hours. No results for input(s): AMMONIA in the last 168 hours. Coagulation Profile: No results for input(s): INR, PROTIME in the last 168 hours. Cardiac Enzymes: Recent Labs  Lab 01/03/18 0344 01/03/18 0908 01/03/18 1511  TROPONINI 0.23* 0.22* 0.28*   BNP (last 3 results) No results for input(s): PROBNP in the last 8760 hours. HbA1C: No results for input(s): HGBA1C in the last 72 hours. CBG: No results for input(s): GLUCAP in the last 168 hours. Lipid Profile: No results for input(s): CHOL, HDL, LDLCALC, TRIG, CHOLHDL, LDLDIRECT in the last 72 hours. Thyroid Function Tests: No results for input(s): TSH, T4TOTAL, FREET4, T3FREE, THYROIDAB in the last 72 hours. Anemia Panel: No results for input(s): VITAMINB12, FOLATE, FERRITIN, TIBC, IRON, RETICCTPCT in the last 72 hours. Sepsis Labs: No results for input(s): PROCALCITON, LATICACIDVEN in the last 168 hours.  Recent Results (from the past 240 hour(s))  Expectorated sputum assessment w rflx to resp cult     Status: None   Collection Time: 01/05/18 10:42 AM  Result Value Ref Range Status   Specimen Description EXPECTORATED SPUTUM  Final   Special Requests NONE  Final   Sputum evaluation   Final    THIS SPECIMEN IS ACCEPTABLE FOR SPUTUM CULTURE Performed at  [facility], [address] [place], [place] [address]    Report Status 01/05/2018 FINAL  Final  Culture, respiratory     Status: None   Collection Time: 01/05/18 10:42 AM  Result Value Ref Range Status   Specimen Description EXPECTORATED SPUTUM  Final   Special Requests NONE Reflexed from [id number]  Final   Gram Stain   Final    ABUNDANT WBC PRESENT, PREDOMINANTLY PMN ABUNDANT GRAM POSITIVE COCCI    Culture   Final    Consistent with normal respiratory flora. Performed at [facility], [place] [address], [place], [place] [address]    Report Status 01/07/2018 FINAL  Final         Radiology Studies: No results found.      Scheduled Meds: . amoxicillin-clavulanate  1 tablet Oral Q24H  . apixaban  5 mg Oral BID  . ferrous sulfate  325 mg Oral BID WC  . furosemide  40 mg Oral BID  . loratadine  10 mg Oral Daily  . mouth rinse  15 mL Mouth  Rinse BID  . methylPREDNISolone (SOLU-MEDROL) injection  60 mg Intravenous Q12H  . mometasone-formoterol  2 puff Inhalation BID  . pantoprazole  40 mg Oral Daily  . spironolactone  12.5 mg Oral Daily   Continuous Infusions:   LOS: 5 days   Time spent=25 mins    Catilyn Boggus Arsenio Loader, MD [facility]  If 7PM-7AM, please contact night-coverage [email or web address] 01/08/2018, 3:39 PM

## 2018-01-08 NOTE — Care Management Note (Addendum)
Case Management Note  Patient Details  Name: [name] MRN: [id number] Date of Birth: [birth year]  Subjective/Objective:  CHF                 Action/Plan: Patient lives at home with her daughter; PCP: Leonard Downing, MD - he will not sign any HHC orders for the patient at discharge; he wants the patient to be seen in his office first and he will make the decision on whether or not the patient needs [facility]; Pharmacy of choice is [facility]; DME - walker , home 02; her daughter cooks her meals without salt; CM will continue to follow for progression of care.  Expected Discharge Date:  01/07/18               Expected Discharge Plan:  Home/Self Care  Discharge planning Services  CM Consult  Status of Service:  In process, will continue to follow  Sherrilyn Rist [phone] 01/08/2018, 11:19 AM

## 2018-01-08 NOTE — Plan of Care (Signed)
  Problem: Education: Goal: Knowledge of General Education information will improve Description Including pain rating scale, medication(s)/side effects and non-pharmacologic comfort measures Outcome: Progressing   Problem: Health Behavior/Discharge Planning: Goal: Ability to manage health-related needs will improve Outcome: Progressing   Problem: Clinical Measurements: Goal: Ability to maintain clinical measurements within normal limits will improve Outcome: Progressing Goal: Will remain free from infection Outcome: Progressing Goal: Diagnostic test results will improve Outcome: Progressing Goal: Cardiovascular complication will be avoided Outcome: Progressing   Problem: Activity: Goal: Risk for activity intolerance will decrease Outcome: Progressing   Problem: Nutrition: Goal: Adequate nutrition will be maintained Outcome: Progressing   Problem: Elimination: Goal: Will not experience complications related to bowel motility Outcome: Progressing Goal: Will not experience complications related to urinary retention Outcome: Progressing   Problem: Pain Managment: Goal: General experience of comfort will improve Outcome: Progressing   Problem: Safety: Goal: Ability to remain free from injury will improve Outcome: Progressing   Problem: Skin Integrity: Goal: Risk for impaired skin integrity will decrease Outcome: Progressing   Problem: Education: Goal: Ability to demonstrate management of disease process will improve Outcome: Progressing Goal: Ability to verbalize understanding of medication therapies will improve Outcome: Progressing   Problem: Cardiac: Goal: Ability to achieve and maintain adequate cardiopulmonary perfusion will improve Outcome: Progressing   

## 2018-01-09 DIAGNOSIS — Z8639 Personal history of other endocrine, nutritional and metabolic disease: Secondary | ICD-10-CM

## 2018-01-09 MED ORDER — SPIRONOLACTONE 25 MG PO TABS: 12.5000 mg | ORAL_TABLET | Freq: Every day | ORAL | 0 refills | Status: DC

## 2018-01-09 MED ORDER — PREDNISONE 10 MG PO TABS: 40.0000 mg | ORAL_TABLET | Freq: Every day | ORAL | 0 refills | Status: AC

## 2018-01-09 MED ORDER — AMOXICILLIN-POT CLAVULANATE 500-125 MG PO TABS: 1.0000 | ORAL_TABLET | ORAL | 0 refills | Status: AC

## 2018-01-09 MED FILL — SPIRONOLACTONE 25 MG TABLET: 25 | 30 days supply | Qty: 15 | Fill #0

## 2018-01-09 MED FILL — AMOX-CLAV 500-125 MG TABLET: 500-125 | 2 days supply | Qty: 2 | Fill #0

## 2018-01-09 MED FILL — predniSONE 10 MG TABS: 10 | 3 days supply | Qty: 12 | Fill #0

## 2018-01-09 NOTE — Discharge Summary (Signed)
Physician Discharge Summary  [name]:[id number] DOB: 1925-08-06 DOA: 01/02/2018  PCP: Leonard Downing, MD  Admit date: 01/02/2018 Discharge date: 01/09/2018  Admitted From: Home Disposition: Home  Recommendations for Outpatient Follow-up:  1. Follow up with PCP in 1-2 weeks 2. Please obtain BMP/CBC in one week your next doctors visit.  3. Take oral Augmentin for 2 more days 4. Prednisone 40 mg orally 3 days 5. Aldactone 12.5 mg orally daily  Home Health: Home PT Equipment/Devices: None Discharge Condition: Stable CODE STATUS: Full code Diet recommendation: Cardiac diet  Brief/Interim Summary: 85 year old with history of essential hypertension, paroxysmal atrial fibrillation, combined diastolic and systolic congestive heart failure,, CKD stage II-3, COPD with 2 L nasal cannula came to the hospital with complains of worsening progressive shortness of breath.  During the hospitalization patient was treated for both COPD exacerbation and CHF exacerbation.  Patient received some diuresis during the hospital stay but also received IV Solu-Medrol and bronchodilators.  Over the course of couple of days patient symptoms significantly improved and her breathing improved.  She felt close to baseline.  She was on 2 L nasal cannula which is her baseline at home. Discharged today in stable condition with recommendations as stated above   Discharge Diagnoses:  Active Problems:   Hypertension   H/O: hypothyroidism   Essential hypertension   PAF (paroxysmal atrial fibrillation) (HCC)   Acute on chronic systolic (congestive) heart failure (HCC)   NSVT (nonsustained ventricular tachycardia) (HCC)   Acute CHF (congestive heart failure) (HCC)   Acute on chronic combined systolic and diastolic congestive heart failure (HCC)  Acute hypoxic respiratory distress with abnormal breath sounds Acute on chronic systolic CHF Acute mild COPD exacerbation - Patient's breathing has significantly  improved.  We will transition to oral diuretics including Aldactone 12.5 mg orally daily - Transition Solu-Medrol to oral prednisone, advised to continue using home bronchodilators -Advised to take home with her incentive spirometry and flutter valve. -Currently on baseline 2 L nasal cannula.  Paroxysmal atrial fibrillation - Sinus rhythm, continue Eliquis.  Chronic kidney disease stage III -Creatinine stable around 1.2, closely monitor this  Leukocytosis - Continue Solu-Medrol IV today, plans on transitioning to oral tomorrow  Essential hypertension -Continue Lasix 40 mg twice daily, Aldactone 12.5 mg daily  On Eliquis for DVT prophylaxis while here Full code Discharge today  Discharge Instructions   Allergies as of 01/09/2018      Reactions   Indomethacin Anaphylaxis, Other (See Comments)   "took it fine for awhile; one day I stopped breathing and I ended up in the hospital" (01/02/2012) Pt has tolerated aspirin      Medication List    TAKE these medications   acetaminophen 325 MG tablet Commonly known as:  TYLENOL Take 2 tablets (650 mg total) by mouth every 4 (four) hours as needed for headache or mild pain.   albuterol (2.5 MG/3ML) 0.083% nebulizer solution Commonly known as:  PROVENTIL Take 3 mLs (2.5 mg total) by nebulization every 6 (six) hours as needed for wheezing or shortness of breath.   albuterol 108 (90 Base) MCG/ACT inhaler Commonly known as:  PROVENTIL HFA;VENTOLIN HFA Inhale 2 puffs into the lungs every 6 (six) hours as needed for wheezing.   amoxicillin-clavulanate 500-125 MG tablet Commonly known as:  AUGMENTIN Take 1 tablet (500 mg total) by mouth daily for 2 days.   apixaban 5 MG Tabs tablet Commonly known as:  ELIQUIS Take 1 tablet (5 mg total) by mouth 2 (two) times  daily.   cetirizine 10 MG tablet Commonly known as:  ZYRTEC Take 10 mg by mouth daily as needed for allergies.   cholecalciferol 1000 units tablet Commonly known as:   VITAMIN D Take 1,000 Units by mouth daily.   docusate sodium 100 MG capsule Commonly known as:  COLACE Take 1 capsule (100 mg total) by mouth 2 (two) times daily. What changed:    when to take this  reasons to take this   ferrous sulfate 325 (65 FE) MG tablet Take 325 mg by mouth 2 (two) times daily with a meal.   fluticasone 50 MCG/ACT nasal spray Commonly known as:  FLONASE Place 1 spray into both nostrils daily as needed for allergies.   furosemide 40 MG tablet Commonly known as:  LASIX Take 1 tablet (40 mg total) by mouth 2 (two) times daily.   mometasone-formoterol 100-5 MCG/ACT Aero Commonly known as:  DULERA Inhale 2 puffs into the lungs 2 (two) times daily.   pantoprazole 40 MG tablet Commonly known as:  PROTONIX Take 1 tablet (40 mg total) by mouth daily.   polyethylene glycol packet Commonly known as:  MIRALAX / GLYCOLAX Take 17 g by mouth daily as needed for mild constipation.   polyvinyl alcohol 1.4 % ophthalmic solution Commonly known as:  LIQUIFILM TEARS Place 1 drop into both eyes as needed for dry eyes.   predniSONE 10 MG tablet Commonly known as:  DELTASONE Take 4 tablets (40 mg total) by mouth daily for 3 days.   spironolactone 25 MG tablet Commonly known as:  ALDACTONE Take 0.5 tablets (12.5 mg total) by mouth daily. Start taking on:  January 10, 2018      Follow-up Information    [facility].   Specialty:  Cardiology Why:  You have a hospital follow-up appointment scheduled for 02/04/2018 at 8:45am with Richardson Dopp, PA-C. Contact information: [address] [place]       Leonard Downing, MD. Go on 01/22/2018.   Specialty:  Family Medicine Why:  @11 :Max Sane information: [place] [address] [phone]        Nahser, Wonda Cheng, MD.   Specialty:  Cardiology Contact information: [address] [place] [address] [phone]          Allergies  Allergen Reactions  . Indomethacin Anaphylaxis and Other (See Comments)    "took it fine for awhile; one day I stopped breathing and I ended up in the hospital" (01/02/2012) Pt has tolerated aspirin    You were cared for by a hospitalist during your hospital stay. If you have any questions about your discharge medications or the care you received while you were in the hospital after you are discharged, you can call the unit and asked to speak with the hospitalist on call if the hospitalist that took care of you is not available. Once you are discharged, your primary care physician will handle any further medical issues. Please note that no refills for any discharge medications will be authorized once you are discharged, as it is imperative that you return to your primary care physician (or establish a relationship with a primary care physician if you do not have one) for your aftercare needs so that they can reassess your need for medications and monitor your lab values.  Consultations:  Cardiology   Procedures/Studies: Dg Chest 2 View  Result Date: 01/04/2018 CLINICAL DATA:  Increasing  shortness of breath. EXAM: CHEST - 2 VIEW COMPARISON:  Single-view of the chest 01/02/2018 and PA and lateral chest 11/10/2017. Soft tissue neck CT 10/22/2016. FINDINGS: The lungs are clear. The chest is hyperexpanded with some prominence of the pulmonary interstitium. Heart size is normal. Aortic atherosclerosis is seen. Bulky calcifications at the base of the neck are consistent with thyroid calcifications seen on prior neck CT. IMPRESSION: No acute disease. Findings compatible with COPD. Electronically Signed   By: Inge Rise M.D.   On: 01/04/2018 13:44   Dg Chest Port 1 View  Result Date: 01/03/2018 CLINICAL DATA:  Acute onset of shortness of breath. EXAM: PORTABLE CHEST 1 VIEW COMPARISON:  Chest radiograph performed 11/10/2017 FINDINGS: The  lungs are well-aerated. Vascular congestion is noted. Increased interstitial markings may reflect mild interstitial edema. There is no evidence of pleural effusion or pneumothorax. The cardiomediastinal silhouette is mildly enlarged. No acute osseous abnormalities are seen. Degenerative change is noted at the glenohumeral joints bilaterally, more prominent on the right. IMPRESSION: Vascular congestion and mild cardiomegaly. Increased interstitial markings may reflect mild interstitial edema. Electronically Signed   By: Garald Balding M.D.   On: 01/03/2018 00:22      Subjective: No complaints, feels better back to her baseline.  Wishes to go home.   Discharge Exam: Vitals:   01/09/18 0818 01/09/18 0912  BP: (!) 141/70   Pulse: 69   Resp:    Temp:    SpO2:  97%   Vitals:   01/08/18 2018 01/09/18 0359 01/09/18 0818 01/09/18 0912  BP:  121/60 (!) 141/70   Pulse:  64 69   Resp:  20    Temp:  (!) 97.4 F (36.3 C)    TempSrc:  Oral    SpO2: 97% 97%  97%  Weight:  68.6 kg    Height:        General: Pt is alert, awake, not in acute distress Cardiovascular: RRR, S1/S2 +, no rubs, no gallops Respiratory: CTA bilaterally, no wheezing, no rhonchi Abdominal: Soft, NT, ND, bowel sounds + Extremities: no edema, no cyanosis    The results of significant diagnostics from this hospitalization (including imaging, microbiology, ancillary and laboratory) are listed below for reference.     Microbiology: Recent Results (from the past 240 hour(s))  Expectorated sputum assessment w rflx to resp cult     Status: None   Collection Time: 01/05/18 10:42 AM  Result Value Ref Range Status   Specimen Description EXPECTORATED SPUTUM  Final   Special Requests NONE  Final   Sputum evaluation   Final    THIS SPECIMEN IS ACCEPTABLE FOR SPUTUM CULTURE Performed at [facility], [address], [place], [place] [address]    Report Status 01/05/2018 FINAL  Final  Culture, respiratory     Status:  None   Collection Time: 01/05/18 10:42 AM  Result Value Ref Range Status   Specimen Description EXPECTORATED SPUTUM  Final   Special Requests NONE Reflexed from [id number]  Final   Gram Stain   Final    ABUNDANT WBC PRESENT, PREDOMINANTLY PMN ABUNDANT GRAM POSITIVE COCCI    Culture   Final    Consistent with normal respiratory flora. Performed at [facility], [place] [address], [place], [place] [address]    Report Status 01/07/2018 FINAL  Final     Labs: BNP (last 3 results) Recent Labs    04/27/17 2038 11/10/17 0500 01/02/18 2339  BNP 953.5* 1,411.4* 1,380.6*   Basic  Metabolic Panel: Recent Labs  Lab 01/03/18 0344 01/04/18 0542 01/05/18 0520 01/06/18 0621 01/07/18 0705  NA 140 140 139 137 139  K 3.5 4.1 3.7 3.8 4.8  CL 104 101 97* 95* 97*  CO2 24 30 32 32 34*  GLUCOSE 196* 141* 127* 131* 161*  BUN 21 25* 24* 29* 34*  CREATININE 1.16* 1.09* 1.16* 1.39* 1.28*  CALCIUM 8.7* 9.0 8.9 8.7* 8.9  MG 1.9  --   --   --  2.2   Liver Function Tests: Recent Labs  Lab 01/02/18 2339  AST 65*  ALT 45*  ALKPHOS 90  BILITOT 1.4*  PROT 6.6  ALBUMIN 3.4*   No results for input(s): LIPASE, AMYLASE in the last 168 hours. No results for input(s): AMMONIA in the last 168 hours. CBC: Recent Labs  Lab 01/02/18 2339 01/03/18 0009 01/03/18 0344 01/06/18 0621  WBC 9.5  --  9.4 13.9*  NEUTROABS 7.4  --   --   --   HGB 11.0* 11.9* 10.4* 11.1*  HCT 30.2* 35.0* 29.7* 35.7*  MCV 101.7*  --  99.0 95.5  PLT 200  --  181 225   Cardiac Enzymes: Recent Labs  Lab 01/03/18 0344 01/03/18 0908 01/03/18 1511  TROPONINI 0.23* 0.22* 0.28*   BNP: Invalid input(s): POCBNP CBG: No results for input(s): GLUCAP in the last 168 hours. D-Dimer No results for input(s): DDIMER in the last 72 hours. Hgb A1c No results for input(s): HGBA1C in the last 72 hours. Lipid Profile No results for input(s): CHOL, HDL, LDLCALC, TRIG, CHOLHDL, LDLDIRECT in the last 72 hours. Thyroid function  studies No results for input(s): TSH, T4TOTAL, T3FREE, THYROIDAB in the last 72 hours.  Invalid input(s): FREET3 Anemia work up No results for input(s): VITAMINB12, FOLATE, FERRITIN, TIBC, IRON, RETICCTPCT in the last 72 hours. Urinalysis    Component Value Date/Time   COLORURINE YELLOW 07/22/2017 Page 07/22/2017 1204   LABSPEC 1.021 07/22/2017 1204   LABSPEC 1.010 10/29/2013 1105   PHURINE 6.0 07/22/2017 1204   GLUCOSEU NEGATIVE 07/22/2017 1204   GLUCOSEU Negative 10/29/2013 1105   HGBUR NEGATIVE 07/22/2017 1204   BILIRUBINUR NEGATIVE 07/22/2017 1204   BILIRUBINUR Negative 10/29/2013 1105   KETONESUR NEGATIVE 07/22/2017 1204   PROTEINUR 30 (A) 07/22/2017 1204   UROBILINOGEN 1.0 02/13/2014 1137   UROBILINOGEN 0.2 10/29/2013 1105   NITRITE NEGATIVE 07/22/2017 1204   LEUKOCYTESUR SMALL (A) 07/22/2017 1204   LEUKOCYTESUR Trace 10/29/2013 1105   Sepsis Labs Invalid input(s): PROCALCITONIN,  WBC,  LACTICIDVEN Microbiology Recent Results (from the past 240 hour(s))  Expectorated sputum assessment w rflx to resp cult     Status: None   Collection Time: 01/05/18 10:42 AM  Result Value Ref Range Status   Specimen Description EXPECTORATED SPUTUM  Final   Special Requests NONE  Final   Sputum evaluation   Final    THIS SPECIMEN IS ACCEPTABLE FOR SPUTUM CULTURE Performed at [facility], [place] [address], [place], [place] [address]    Report Status 01/05/2018 FINAL  Final  Culture, respiratory     Status: None   Collection Time: 01/05/18 10:42 AM  Result Value Ref Range Status   Specimen Description EXPECTORATED SPUTUM  Final   Special Requests NONE Reflexed from [id number]  Final   Gram Stain   Final    ABUNDANT WBC PRESENT, PREDOMINANTLY PMN ABUNDANT GRAM POSITIVE COCCI    Culture   Final    Consistent with normal respiratory flora. Performed  at [facility], [place] [address], [place], [place] [address]    Report Status 01/07/2018 FINAL  Final      Time coordinating discharge:  I have spent 35 minutes face to face with the patient and on the ward discussing the patients care, assessment, plan and disposition with other care givers. >50% of the time was devoted counseling the patient about the risks and benefits of treatment/Discharge disposition and coordinating care.   SIGNED:   Damita Lack, MD  [facility] 01/09/2018, 2:20 PM Pager   If 7PM-7AM, please contact night-coverage [email or web address] Password [email or web address]

## 2018-01-09 NOTE — Progress Notes (Signed)
Physical Therapy Treatment Patient Details Name: [name] MRN: [id number] DOB: 1925-12-12 Today's Date: 01/09/2018    History of Present Illness Patient is a 85 y/o female who presents with SOB, edema and cough. Admitted with respiratory distress secondary to COPD/CHF. CXR-vascular congestion. PMH includes HTN, paroxysmal A-fib, CHF, CVA, COPD.    PT Comments    Pt only agreeable to transfer to [facility] and stair navigation this session as pt reports "everyone has worn me out." Required supervision for transfer to [facility] and back to recliner using RW and required min guard for stair navigation this session. DOE at 2/4 this session and oxygen sats at 90% on 2L following stair negotiation. Educated about need for son to be there to assist with stair navigation and pt reports he will be able to assist as needed. Will continue to follow acutely to maximize functional mobility independence and safety.    Follow Up Recommendations  Home health PT;Supervision for mobility/OOB     Equipment Recommendations  None recommended by PT    Recommendations for Other Services       Precautions / Restrictions Precautions Precautions: Fall Precaution Comments: watch 02 Restrictions Weight Bearing Restrictions: No    Mobility  Bed Mobility               General bed mobility comments: Up in chair upon PT arrival.   Transfers Overall transfer level: Needs assistance Equipment used: Rolling walker (2 wheeled) Transfers: Sit to/from [facility] Sit to Stand: Supervision Stand pivot transfers: Supervision       General transfer comment: Supervision for safety. Cues to keep RW with her during stand pivot transfer to [facility] and back to recliner.   Ambulation/Gait             General Gait Details: Pt refusing ambulation, as she was going home, but was agreeable to stair navigation. Took pt to/from the stairwell in recliner   Stairs Stairs: Yes Stairs assistance: Min  guard Stair Management: Two rails;Step to pattern;Forwards Number of Stairs: 2 General stair comments: Demonstrated backwards technique using RW, however, pt refusing to practice that way as she reports she grabs onto doorfram to perform steps at home. Pt using bilateral rails as "doorframe" to practice to steps. Required min guard for safety. Educated to have son for guarding during stair navigation. Oxygen sats at 90% on 2L after performing.    Wheelchair Mobility    Modified Rankin (Stroke Patients Only)       Balance Overall balance assessment: Needs assistance Sitting-balance support: Feet supported;No upper extremity supported Sitting balance-Leahy Scale: Good     Standing balance support: Bilateral upper extremity supported;During functional activity;No upper extremity supported Standing balance-Leahy Scale: Fair Standing balance comment: Able to perform static standing and toileting tasks without UE support.                             Cognition Arousal/Alertness: Awake/alert Behavior During Therapy: WFL for tasks assessed/performed Overall Cognitive Status: Within Functional Limits for tasks assessed                                 General Comments: for basic mobility tasks      Exercises      General Comments General comments (skin integrity, edema, etc.): Pt very eager to go home today. Oxygen sats at 90% and above on 2L this session.  Pertinent Vitals/Pain Pain Assessment: No/denies pain    Home Living                      Prior Function            PT Goals (current goals can now be found in the care plan section) Acute Rehab PT Goals Patient Stated Goal: to go home today PT Goal Formulation: With patient Time For Goal Achievement: 01/20/18 Potential to Achieve Goals: Good Progress towards PT goals: Progressing toward goals    Frequency    Min 3X/week      PT Plan Current plan remains appropriate     Co-evaluation              AM-PAC PT "6 Clicks" Mobility   Outcome Measure  Help needed turning from your back to your side while in a flat bed without using bedrails?: A Little Help needed moving from lying on your back to sitting on the side of a flat bed without using bedrails?: A Little Help needed moving to and from a bed to a chair (including a wheelchair)?: None Help needed standing up from a chair using your arms (e.g., wheelchair or bedside chair)?: None Help needed to walk in hospital room?: A Little Help needed climbing 3-5 steps with a railing? : A Little 6 Click Score: 20    End of Session Equipment Utilized During Treatment: Gait belt;Oxygen Activity Tolerance: Patient limited by fatigue Patient left: in chair;with call bell/phone within reach;with chair alarm set Nurse Communication: Mobility status PT Visit Diagnosis: Unsteadiness on feet (R26.81);Difficulty in walking, not elsewhere classified (R26.2)     Time: 8144-8185 PT Time Calculation (min) (ACUTE ONLY): 27 min  Charges:  $Gait Training: 8-22 mins $Self Care/Home Management: 8-22                     Leighton Ruff, PT, DPT  Acute Rehabilitation Services  Pager: [phone] Office: [phone]    [name] 01/09/2018, 10:04 AM

## 2018-01-09 NOTE — Progress Notes (Signed)
Pt discharged via wheelchair with staff Pt has all belongings  Pt IV and telemetry removed  Pt discharge education provided by Aniceto Boss

## 2018-01-24 ENCOUNTER — Emergency Department (HOSPITAL_COMMUNITY): Payer: Medicare Other

## 2018-01-24 ENCOUNTER — Encounter (HOSPITAL_COMMUNITY): Payer: Self-pay | Admitting: Emergency Medicine

## 2018-01-24 ENCOUNTER — Observation Stay (HOSPITAL_COMMUNITY)
Admission: EM | Admit: 2018-01-24 | Discharge: 2018-01-27 | Disposition: A | Discharge status: Home / Self Care | Payer: Medicare Other | Attending: Family Medicine | Admitting: Family Medicine

## 2018-01-24 DIAGNOSIS — Z8249 Family history of ischemic heart disease and other diseases of the circulatory system: Secondary | ICD-10-CM | POA: Insufficient documentation

## 2018-01-24 DIAGNOSIS — J9811 Atelectasis: Secondary | ICD-10-CM | POA: Diagnosis not present

## 2018-01-24 DIAGNOSIS — D649 Anemia, unspecified: Secondary | ICD-10-CM | POA: Insufficient documentation

## 2018-01-24 DIAGNOSIS — Z7951 Long term (current) use of inhaled steroids: Secondary | ICD-10-CM | POA: Insufficient documentation

## 2018-01-24 DIAGNOSIS — E049 Nontoxic goiter, unspecified: Secondary | ICD-10-CM | POA: Diagnosis not present

## 2018-01-24 DIAGNOSIS — Z7901 Long term (current) use of anticoagulants: Secondary | ICD-10-CM | POA: Insufficient documentation

## 2018-01-24 DIAGNOSIS — I5043 Acute on chronic combined systolic (congestive) and diastolic (congestive) heart failure: Secondary | ICD-10-CM | POA: Insufficient documentation

## 2018-01-24 DIAGNOSIS — I130 Hypertensive heart and chronic kidney disease with heart failure and stage 1 through stage 4 chronic kidney disease, or unspecified chronic kidney disease: Secondary | ICD-10-CM | POA: Diagnosis not present

## 2018-01-24 DIAGNOSIS — Z87891 Personal history of nicotine dependence: Secondary | ICD-10-CM | POA: Diagnosis not present

## 2018-01-24 DIAGNOSIS — Z515 Encounter for palliative care: Secondary | ICD-10-CM

## 2018-01-24 DIAGNOSIS — J449 Chronic obstructive pulmonary disease, unspecified: Secondary | ICD-10-CM | POA: Diagnosis present

## 2018-01-24 DIAGNOSIS — I491 Atrial premature depolarization: Secondary | ICD-10-CM | POA: Diagnosis not present

## 2018-01-24 DIAGNOSIS — Z87442 Personal history of urinary calculi: Secondary | ICD-10-CM | POA: Diagnosis not present

## 2018-01-24 DIAGNOSIS — Z933 Colostomy status: Secondary | ICD-10-CM | POA: Diagnosis not present

## 2018-01-24 DIAGNOSIS — J441 Chronic obstructive pulmonary disease with (acute) exacerbation: Secondary | ICD-10-CM | POA: Diagnosis not present

## 2018-01-24 DIAGNOSIS — K219 Gastro-esophageal reflux disease without esophagitis: Secondary | ICD-10-CM | POA: Diagnosis not present

## 2018-01-24 DIAGNOSIS — I700 Atherosclerosis of aorta: Secondary | ICD-10-CM | POA: Insufficient documentation

## 2018-01-24 DIAGNOSIS — M1990 Unspecified osteoarthritis, unspecified site: Secondary | ICD-10-CM | POA: Insufficient documentation

## 2018-01-24 DIAGNOSIS — Z79899 Other long term (current) drug therapy: Secondary | ICD-10-CM | POA: Insufficient documentation

## 2018-01-24 DIAGNOSIS — R0602 Shortness of breath: Secondary | ICD-10-CM

## 2018-01-24 DIAGNOSIS — I480 Paroxysmal atrial fibrillation: Secondary | ICD-10-CM | POA: Diagnosis not present

## 2018-01-24 DIAGNOSIS — N183 Chronic kidney disease, stage 3 (moderate): Secondary | ICD-10-CM | POA: Diagnosis not present

## 2018-01-24 DIAGNOSIS — Z66 Do not resuscitate: Secondary | ICD-10-CM | POA: Insufficient documentation

## 2018-01-24 DIAGNOSIS — E039 Hypothyroidism, unspecified: Secondary | ICD-10-CM | POA: Diagnosis not present

## 2018-01-24 DIAGNOSIS — Z8673 Personal history of transient ischemic attack (TIA), and cerebral infarction without residual deficits: Secondary | ICD-10-CM | POA: Diagnosis not present

## 2018-01-24 DIAGNOSIS — I493 Ventricular premature depolarization: Secondary | ICD-10-CM | POA: Insufficient documentation

## 2018-01-24 DIAGNOSIS — Z7189 Other specified counseling: Secondary | ICD-10-CM

## 2018-01-24 LAB — CBC WITH DIFFERENTIAL/PLATELET
Abs Immature Granulocytes: 0.02 10*3/uL (ref 0.00–0.07)
Basophils Absolute: 0 10*3/uL (ref 0.0–0.1)
Basophils Relative: 1 %
EOS ABS: 0.1 10*3/uL (ref 0.0–0.5)
Eosinophils Relative: 2 %
HCT: 34.6 % — ABNORMAL LOW (ref 36.0–46.0)
Hemoglobin: 11.2 g/dL — ABNORMAL LOW (ref 12.0–15.0)
IMMATURE GRANULOCYTES: 1 %
Lymphocytes Relative: 10 %
Lymphs Abs: 0.4 10*3/uL — ABNORMAL LOW (ref 0.7–4.0)
MCH: 32.1 pg (ref 26.0–34.0)
MCHC: 32.4 g/dL (ref 30.0–36.0)
MCV: 99.1 fL (ref 80.0–100.0)
Monocytes Absolute: 0.6 10*3/uL (ref 0.1–1.0)
Monocytes Relative: 14 %
Neutro Abs: 3.2 10*3/uL (ref 1.7–7.7)
Neutrophils Relative %: 72 %
PLATELETS: 226 10*3/uL (ref 150–400)
RBC: 3.49 MIL/uL — ABNORMAL LOW (ref 3.87–5.11)
RDW: 12.5 % (ref 11.5–15.5)
WBC: 4.4 10*3/uL (ref 4.0–10.5)
nRBC: 0 % (ref 0.0–0.2)

## 2018-01-24 LAB — BASIC METABOLIC PANEL
Anion gap: 10 (ref 5–15)
BUN: 17 mg/dL (ref 8–23)
CO2: 32 mmol/L (ref 22–32)
Calcium: 9 mg/dL (ref 8.9–10.3)
Chloride: 98 mmol/L (ref 98–111)
Creatinine, Ser: 1.05 mg/dL — ABNORMAL HIGH (ref 0.44–1.00)
GFR calc Af Amer: 54 mL/min — ABNORMAL LOW (ref >60)
GFR calc non Af Amer: 47 mL/min — ABNORMAL LOW (ref >60)
Glucose, Bld: 98 mg/dL (ref 70–99)
Potassium: 4.1 mmol/L (ref 3.5–5.1)
Sodium: 140 mmol/L (ref 135–145)

## 2018-01-24 LAB — POCT I-STAT EG7
Acid-Base Excess: 11 mmol/L — ABNORMAL HIGH (ref 0.0–2.0)
Bicarbonate: 37.3 mmol/L — ABNORMAL HIGH (ref 20.0–28.0)
Calcium, Ion: 1.1 mmol/L — ABNORMAL LOW (ref 1.15–1.40)
HEMATOCRIT: 35 % — AB (ref 36.0–46.0)
Hemoglobin: 11.9 g/dL — ABNORMAL LOW (ref 12.0–15.0)
O2 Saturation: 71 %
Potassium: 4.1 mmol/L (ref 3.5–5.1)
Sodium: 140 mmol/L (ref 135–145)
TCO2: 39 mmol/L — AB (ref 22–32)
pCO2, Ven: 59.4 mmHg (ref 44.0–60.0)
pH, Ven: 7.407 (ref 7.250–7.430)
pO2, Ven: 38 mmHg (ref 32.0–45.0)

## 2018-01-24 LAB — I-STAT TROPONIN, ED: Troponin i, poc: 0.05 ng/mL (ref 0.00–0.08)

## 2018-01-24 LAB — BRAIN NATRIURETIC PEPTIDE: B Natriuretic Peptide: 810.1 pg/mL — ABNORMAL HIGH (ref 0.0–100.0)

## 2018-01-24 MED ORDER — POLYVINYL ALCOHOL 1.4 % OP SOLN: 1.0000 [drp] | OPHTHALMIC | Status: DC | PRN

## 2018-01-24 MED ORDER — SODIUM CHLORIDE 0.9 % IV SOLN
12.5000 mg | Freq: Four times a day (QID) | INTRAVENOUS | Status: DC | PRN
  Filled 2018-01-24: qty 0.5

## 2018-01-24 MED ORDER — HALOPERIDOL 0.5 MG PO TABS
0.5000 mg | ORAL_TABLET | ORAL | Status: DC | PRN
  Filled 2018-01-24: qty 1

## 2018-01-24 MED ORDER — HALOPERIDOL LACTATE 5 MG/ML IJ SOLN: 0.5000 mg | INTRAMUSCULAR | Status: DC | PRN

## 2018-01-24 MED ORDER — ONDANSETRON HCL 4 MG/2ML IJ SOLN: 4.0000 mg | Freq: Four times a day (QID) | INTRAMUSCULAR | Status: DC | PRN

## 2018-01-24 MED ORDER — VITAMIN D 25 MCG (1000 UNIT) PO TABS
1000.0000 [IU] | ORAL_TABLET | Freq: Every day | ORAL | Status: DC
  Administered 2018-01-25 – 2018-01-27 (×3): 1000 [IU] via ORAL
  Filled 2018-01-24 (×3): qty 1

## 2018-01-24 MED ORDER — LORAZEPAM 1 MG PO TABS: 1.0000 mg | ORAL_TABLET | ORAL | Status: DC | PRN

## 2018-01-24 MED ORDER — BIOTENE DRY MOUTH MT LIQD: 15.0000 mL | OROMUCOSAL | Status: DC | PRN

## 2018-01-24 MED ORDER — PANTOPRAZOLE SODIUM 40 MG PO TBEC
40.0000 mg | DELAYED_RELEASE_TABLET | Freq: Every day | ORAL | Status: DC
  Administered 2018-01-25 – 2018-01-27 (×3): 40 mg via ORAL
  Filled 2018-01-24 (×3): qty 1

## 2018-01-24 MED ORDER — GLYCOPYRROLATE 1 MG PO TABS
1.0000 mg | ORAL_TABLET | ORAL | Status: DC | PRN
  Filled 2018-01-24: qty 1

## 2018-01-24 MED ORDER — POLYVINYL ALCOHOL 1.4 % OP SOLN
1.0000 [drp] | Freq: Four times a day (QID) | OPHTHALMIC | Status: DC | PRN
  Filled 2018-01-24: qty 15

## 2018-01-24 MED ORDER — FUROSEMIDE 40 MG PO TABS
40.0000 mg | ORAL_TABLET | Freq: Two times a day (BID) | ORAL | Status: DC
  Administered 2018-01-24 – 2018-01-27 (×6): 40 mg via ORAL
  Filled 2018-01-24 (×2): qty 1
  Filled 2018-01-24: qty 2
  Filled 2018-01-24 (×3): qty 1

## 2018-01-24 MED ORDER — SODIUM CHLORIDE 0.9% FLUSH
3.0000 mL | Freq: Two times a day (BID) | INTRAVENOUS | Status: DC
  Administered 2018-01-24 – 2018-01-26 (×5): 3 mL via INTRAVENOUS

## 2018-01-24 MED ORDER — MOMETASONE FURO-FORMOTEROL FUM 100-5 MCG/ACT IN AERO
2.0000 | INHALATION_SPRAY | Freq: Two times a day (BID) | RESPIRATORY_TRACT | Status: DC
  Filled 2018-01-24: qty 8.8

## 2018-01-24 MED ORDER — ONDANSETRON 4 MG PO TBDP: 4.0000 mg | ORAL_TABLET | Freq: Four times a day (QID) | ORAL | Status: DC | PRN

## 2018-01-24 MED ORDER — MORPHINE SULFATE (PF) 2 MG/ML IV SOLN
1.0000 mg | INTRAVENOUS | Status: DC | PRN
  Administered 2018-01-25 – 2018-01-27 (×2): 1 mg via INTRAVENOUS
  Filled 2018-01-24 (×2): qty 1

## 2018-01-24 MED ORDER — POLYETHYLENE GLYCOL 3350 17 G PO PACK: 17.0000 g | PACK | Freq: Every day | ORAL | Status: DC | PRN

## 2018-01-24 MED ORDER — LORAZEPAM 2 MG/ML PO CONC: 1.0000 mg | ORAL | Status: DC | PRN

## 2018-01-24 MED ORDER — PREDNISONE 20 MG PO TABS
40.0000 mg | ORAL_TABLET | Freq: Every day | ORAL | Status: DC
  Administered 2018-01-26 – 2018-01-27 (×2): 40 mg via ORAL
  Filled 2018-01-24 (×2): qty 2

## 2018-01-24 MED ORDER — IPRATROPIUM BROMIDE 0.02 % IN SOLN
0.5000 mg | Freq: Once | RESPIRATORY_TRACT | Status: AC
  Administered 2018-01-24: 0.5 mg via RESPIRATORY_TRACT
  Filled 2018-01-24: qty 2.5

## 2018-01-24 MED ORDER — ALUM & MAG HYDROXIDE-SIMETH 200-200-20 MG/5ML PO SUSP: 30.0000 mL | Freq: Four times a day (QID) | ORAL | Status: DC | PRN

## 2018-01-24 MED ORDER — APIXABAN 2.5 MG PO TABS
2.5000 mg | ORAL_TABLET | Freq: Two times a day (BID) | ORAL | Status: DC
  Administered 2018-01-24 – 2018-01-27 (×6): 2.5 mg via ORAL
  Filled 2018-01-24 (×7): qty 1

## 2018-01-24 MED ORDER — GLYCOPYRROLATE 0.2 MG/ML IJ SOLN: 0.2000 mg | INTRAMUSCULAR | Status: DC | PRN

## 2018-01-24 MED ORDER — ACETAMINOPHEN 325 MG PO TABS
650.0000 mg | ORAL_TABLET | ORAL | Status: DC | PRN
  Administered 2018-01-25 – 2018-01-26 (×3): 650 mg via ORAL
  Filled 2018-01-24 (×4): qty 2

## 2018-01-24 MED ORDER — HALOPERIDOL LACTATE 2 MG/ML PO CONC
0.5000 mg | ORAL | Status: DC | PRN
  Filled 2018-01-24: qty 0.3

## 2018-01-24 MED ORDER — FLUTICASONE PROPIONATE 50 MCG/ACT NA SUSP
1.0000 | Freq: Every day | NASAL | Status: DC | PRN
  Filled 2018-01-24: qty 16

## 2018-01-24 MED ORDER — MORPHINE SULFATE (CONCENTRATE) 10 MG/0.5ML PO SOLN: 5.0000 mg | ORAL | Status: DC | PRN

## 2018-01-24 MED ORDER — SPIRONOLACTONE 12.5 MG HALF TABLET
12.5000 mg | ORAL_TABLET | Freq: Every day | ORAL | Status: DC
  Administered 2018-01-25 – 2018-01-27 (×3): 12.5 mg via ORAL
  Filled 2018-01-24 (×3): qty 1

## 2018-01-24 MED ORDER — DIPHENHYDRAMINE HCL 50 MG/ML IJ SOLN: 12.5000 mg | INTRAMUSCULAR | Status: DC | PRN

## 2018-01-24 MED ORDER — MOMETASONE FURO-FORMOTEROL FUM 100-5 MCG/ACT IN AERO
2.0000 | INHALATION_SPRAY | Freq: Two times a day (BID) | RESPIRATORY_TRACT | Status: DC
  Administered 2018-01-25 – 2018-01-27 (×4): 2 via RESPIRATORY_TRACT
  Filled 2018-01-24: qty 8.8

## 2018-01-24 MED ORDER — IPRATROPIUM-ALBUTEROL 0.5-2.5 (3) MG/3ML IN SOLN
3.0000 mL | Freq: Four times a day (QID) | RESPIRATORY_TRACT | Status: DC
  Administered 2018-01-24 – 2018-01-26 (×6): 3 mL via RESPIRATORY_TRACT
  Filled 2018-01-24 (×6): qty 3

## 2018-01-24 MED ORDER — ALBUTEROL SULFATE (2.5 MG/3ML) 0.083% IN NEBU: 2.5000 mg | INHALATION_SOLUTION | RESPIRATORY_TRACT | Status: DC | PRN

## 2018-01-24 MED ORDER — ALBUTEROL SULFATE (2.5 MG/3ML) 0.083% IN NEBU
5.0000 mg | INHALATION_SOLUTION | Freq: Once | RESPIRATORY_TRACT | Status: AC
  Administered 2018-01-24: 5 mg via RESPIRATORY_TRACT
  Filled 2018-01-24: qty 6

## 2018-01-24 MED ORDER — MORPHINE SULFATE (CONCENTRATE) 10 MG/0.5ML PO SOLN
5.0000 mg | ORAL | Status: DC | PRN
  Administered 2018-01-25: 5 mg via SUBLINGUAL
  Filled 2018-01-24: qty 0.5

## 2018-01-24 MED ORDER — LORATADINE 10 MG PO TABS
10.0000 mg | ORAL_TABLET | Freq: Every day | ORAL | Status: DC
  Administered 2018-01-25 – 2018-01-26 (×2): 10 mg via ORAL
  Filled 2018-01-24 (×2): qty 1

## 2018-01-24 MED ORDER — DOCUSATE SODIUM 100 MG PO CAPS
100.0000 mg | ORAL_CAPSULE | Freq: Two times a day (BID) | ORAL | Status: DC
  Administered 2018-01-24: 100 mg via ORAL
  Filled 2018-01-24 (×2): qty 1

## 2018-01-24 MED ORDER — LORAZEPAM 2 MG/ML IJ SOLN: 1.0000 mg | INTRAMUSCULAR | Status: DC | PRN

## 2018-01-24 MED ORDER — METHYLPREDNISOLONE SODIUM SUCC 125 MG IJ SOLR
60.0000 mg | Freq: Four times a day (QID) | INTRAMUSCULAR | Status: AC
  Administered 2018-01-24 – 2018-01-25 (×4): 60 mg via INTRAVENOUS
  Filled 2018-01-24 (×4): qty 2

## 2018-01-24 NOTE — ED Triage Notes (Signed)
Pt arrives with reports of SOB since this AM. Pt reports not being able to sleep in bed due to not being able to breath laying down. Pt wears O2 baseline.

## 2018-01-24 NOTE — ED Provider Notes (Signed)
[facility] EMERGENCY DEPARTMENT Provider Note   CSN: [id number] Arrival date & time: 01/24/18  1443     History   Chief Complaint Chief Complaint  Patient presents with  . Shortness of Breath    HPI [name] is a 85 y.o. female.  HPI  Patient is a 85 year old female with a history of COPD, CHF, combined diastolic and systolic with an EF of 30 to 35%, hypertension, hypothyroidism presenting for shortness of breath.  Patient presents with her daughter who assist in history taking.  According to family, patient has been progressively short of breath for the last couple days.  Patient denies any increased orthopnea or PND.  She denies any increased lower extremity edema.  Patient reports compliance with Lasix as well as her salt restriction.  She denies any active chest pain.  She reports that she has a chronic cough at baseline productive of white sputum, and has not had any increased productive cough nor hemoptysis.  She denies any fevers or chills.  Patient denies any abdominal pain, nausea, vomiting or diarrhea.  Patient was recently admitted for 1 week secondary to CHF and COPD exacerbation within the last 3 weeks.  Past Medical History:  Diagnosis Date  . Anemia    years ago  . Arthritis    "right shoulder" (01/02/2012)  . Bladder mass   . CAP (community acquired pneumonia) 01/02/2012   Lower lobe   . Chronic combined systolic and diastolic CHF (congestive heart failure) ([place])   . Chronic respiratory failure ([place])   . CKD (chronic kidney disease), stage III ([place])    by labs  . COPD (chronic obstructive pulmonary disease) ([place])   . Diverticulitis of colon with perforation 01/14/2012   That is post sigmoid resection with [facility] pouch and end colostomy   . GERD (gastroesophageal reflux disease)    occasional  . Goiter    radioactive iodine ablation/notes 01/02/2012 - large compressing pharynx  . History of kidney stones    X 1  . Hypertension   .  Hypothyroidism    "I have taken Synthroid before" (01/02/2012)   HAS HAD RADIOACTIVE IODINE TX 2 YR S AGO  . Intra-abdominal abscess ([place]) 01/14/2012  . NSVT (nonsustained ventricular tachycardia) ([place])   . PAF (paroxysmal atrial fibrillation) ([place])   . Pneumatosis of intestines 01/14/2012  . Pneumonia 01/02/2012; ? 2009  . PVC's (premature ventricular contractions)   . Radiation 08/10/13-09/15/13   55 gray to vaginal/bladder mass  . Stroke [facility])     Patient Active Problem List   Diagnosis Date Noted  . Acute on chronic combined systolic and diastolic congestive heart failure ([place])   . Acute CHF (congestive heart failure) ([place]) 01/03/2018  . NSVT (nonsustained ventricular tachycardia) ([place]) 12/09/2017  . Acute on chronic systolic (congestive) heart failure ([place]) 11/10/2017  . CAP (community acquired pneumonia) 04/28/2017  . GERD (gastroesophageal reflux disease) 04/28/2017  . LLQ abdominal pain   . Gait abnormality 05/18/2016  . History of squamous cell carcinoma 03/04/2016  . Chest pain 12/28/2015  . Chronic respiratory failure, unsp w hypoxia or hypercapnia (H[facility]) 12/15/2015  . PAF (paroxysmal atrial fibrillation) ([place]) 08/27/2015  . Essential hypertension 06/15/2015  . Squamous cell carcinoma, metastatic ([place]) 06/15/2015  . Acute CVA (cerebrovascular accident) ([place]) 04/13/2015  . Cerebrovascular accident (CVA) due to embolism of left middle cerebral artery ([place])   . HLD (hyperlipidemia)   . Cerebral thrombosis with cerebral infarction 04/12/2015  . Stroke-like symptoms   .  Dysarthria 04/11/2015  . Absolute anemia 09/04/2014  . Elevated troponin 02/14/2014  . Dyspnea   . Overweight (BMI 25.0-29.9) 02/13/2014  . Goiter 02/13/2014  . Chronic systolic CHF (congestive heart failure) ([place]) 02/13/2014  . Squamous cell carcinoma of unknown origin 07/24/2013  . S/P colostomy takedown 10/21/2012 10/23/2012  . Diverticulosis of colon 03/07/2012  . Pulmonary nodules 01/23/2012  .  Hematuria, microscopic 01/14/2012  . Asymptomatic PVCs 01/03/2012  . H/O: hypothyroidism 01/03/2012  . COPD (chronic obstructive pulmonary disease) ([place]) 01/02/2012  . Hypertension 01/02/2012  . Chronic combined systolic and diastolic CHF (congestive heart failure) ([place]) 01/02/2012  . Hx of goiter 01/02/2012    Past Surgical History:  Procedure Laterality Date  . ABDOMINAL HYSTERECTOMY  1980's  . APPENDECTOMY  1980's   "when they did hysterectomy" (01/02/2012)  . CATARACT EXTRACTION W/ INTRAOCULAR LENS  IMPLANT, BILATERAL  ?1990's   [place]  . COLON SURGERY    . COLOSTOMY N/A 03/12/2012   Procedure: COLOSTOMY;  Surgeon: Ralene Ok, MD;  Location: [facility];  Service: General;  Laterality: N/A;  . COLOSTOMY REVISION N/A 03/12/2012   Procedure: COLON RESECTION SIGMOID ;  Surgeon: Ralene Ok, MD;  Location: [facility];  Service: General;  Laterality: N/A;  . COLOSTOMY TAKEDOWN N/A 10/21/2012   Procedure: LAPAROSCOPIC COLOSTOMY TAKEDOWN AND HARTMANS ANASTOMOSIS, rigid proctoscopy;  Surgeon: Ralene Ok, MD;  Location: [facility];  Service: General;  Laterality: N/A;  . EYE SURGERY    . LYSIS OF ADHESION N/A 10/21/2012   Procedure: LYSIS OF ADHESION;  Surgeon: Ralene Ok, MD;  Location: [facility];  Service: General;  Laterality: N/A;  . TRANSURETHRAL RESECTION OF BLADDER TUMOR WITH GYRUS (TURBT-GYRUS) N/A 06/30/2013   Procedure: TRANSURETHRAL RESECTION OF BLADDER TUMOR WITH GYRUS (TURBT-GYRUS)/VAGINAL BIOPSY;  Surgeon: Ardis Hughs, MD;  Location: [facility];  Service: Urology;  Laterality: N/A;     OB History   No obstetric history on file.      Home Medications    Prior to Admission medications   Medication Sig Start Date End Date Taking? Authorizing Provider  acetaminophen (TYLENOL) 325 MG tablet Take 2 tablets (650 mg total) by mouth every 4 (four) hours as needed for headache or mild pain. 11/15/17   Bonnell Public, MD  albuterol (PROVENTIL HFA;VENTOLIN HFA) 108 (90 BASE)  MCG/ACT inhaler Inhale 2 puffs into the lungs every 6 (six) hours as needed for wheezing.    [provider]  albuterol (PROVENTIL) (2.5 MG/3ML) 0.083% nebulizer solution Take 3 mLs (2.5 mg total) by nebulization every 6 (six) hours as needed for wheezing or shortness of breath. 12/17/15   Elgergawy, Silver Huguenin, MD  apixaban (ELIQUIS) 5 MG TABS tablet Take 1 tablet (5 mg total) by mouth 2 (two) times daily. 08/28/15   Ghimire, Henreitta Leber, MD  cetirizine (ZYRTEC) 10 MG tablet Take 10 mg by mouth daily as needed for allergies.     [provider]  cholecalciferol (VITAMIN D) 1000 units tablet Take 1,000 Units by mouth daily.    [provider]  docusate sodium (COLACE) 100 MG capsule Take 1 capsule (100 mg total) by mouth 2 (two) times daily. Patient taking differently: Take 100 mg by mouth 2 (two) times daily as needed for mild constipation.  05/01/17   Oretha Milch D, MD  ferrous sulfate 325 (65 FE) MG tablet Take 325 mg by mouth 2 (two) times daily with a meal.     [provider]  fluticasone (FLONASE) 50 MCG/ACT nasal spray  Place 1 spray into both nostrils daily as needed for allergies.  07/19/17   [provider]  furosemide (LASIX) 40 MG tablet Take 1 tablet (40 mg total) by mouth 2 (two) times daily. 11/15/17   Bonnell Public, MD  mometasone-formoterol (DULERA) 100-5 MCG/ACT AERO Inhale 2 puffs into the lungs 2 (two) times daily. 11/15/17   Dana Allan I, MD  pantoprazole (PROTONIX) 40 MG tablet Take 1 tablet (40 mg total) by mouth daily. 08/14/17   Fenton Foy, NP  polyethylene glycol (MIRALAX / GLYCOLAX) packet Take 17 g by mouth daily as needed for mild constipation.     [provider]  polyvinyl alcohol (LIQUIFILM TEARS) 1.4 % ophthalmic solution Place 1 drop into both eyes as needed for dry eyes.    [provider]  spironolactone (ALDACTONE) 25 MG tablet Take 0.5 tablets (12.5 mg total) by mouth daily. 01/10/18 02/09/18   Damita Lack, MD    Family History Family History  Problem Relation Age of Onset  . Cancer Sister        Breast  . Stroke Sister   . Heart disease Mother   . Heart disease Other     Social History Social History   Tobacco Use  . Smoking status: Former Smoker    Packs/day: 0.12    Years: 35.00    Pack years: 4.20    Types: Cigarettes    Last attempt to quit: 01/02/1991    Years since quitting: 27.0  . Smokeless tobacco: Never Used  Substance Use Topics  . Alcohol use: No  . Drug use: No     Allergies   Indomethacin   Review of Systems Review of Systems  Constitutional: Negative for chills and fever.  HENT: Negative for congestion, rhinorrhea, sinus pain and sore throat.   Eyes: Negative for visual disturbance.  Respiratory: Positive for cough and shortness of breath. Negative for chest tightness.   Cardiovascular: Negative for chest pain, palpitations and leg swelling.  Gastrointestinal: Negative for abdominal pain, diarrhea, nausea and vomiting.  Genitourinary: Negative for dysuria and flank pain.  Musculoskeletal: Negative for back pain and myalgias.  Skin: Negative for rash.  Neurological: Negative for dizziness, syncope, light-headedness and headaches.     Physical Exam Updated Vital Signs BP (!) 138/55 (BP Location: Left Arm)   Pulse 79   Temp 97.9 F (36.6 C) (Oral)   Resp (!) 21   Ht 5' (1.524 m)   Wt 68.6 kg   SpO2 97%   BMI 29.55 kg/m   Physical Exam Vitals signs and nursing note reviewed.  Constitutional:      General: She is not in acute distress.    Appearance: She is well-developed.  HENT:     Head: Normocephalic and atraumatic.  Eyes:     Conjunctiva/sclera: Conjunctivae normal.     Pupils: Pupils are equal, round, and reactive to light.  Neck:     Musculoskeletal: Normal range of motion and neck supple.     Vascular: JVD present.  Cardiovascular:     Rate and Rhythm: Normal rate and regular rhythm.     Pulses: Normal  pulses.     Heart sounds: S1 normal and S2 normal. No murmur.     Comments: No lower extremity edema; no calf TTP.  JVD present.  Pulmonary:     Effort: Tachypnea and accessory muscle usage present.     Breath sounds: Examination of the right-lower field reveals rales. Examination of the left-lower field  reveals rales. Decreased breath sounds and rales present. No wheezing.  Abdominal:     General: There is no distension.     Palpations: Abdomen is soft.     Tenderness: There is no abdominal tenderness. There is no guarding.  Musculoskeletal: Normal range of motion.        General: No deformity.  Lymphadenopathy:     Cervical: No cervical adenopathy.  Skin:    General: Skin is warm and dry.     Findings: No erythema or rash.  Neurological:     Mental Status: She is alert.     Comments: Cranial nerves grossly intact. Patient moves extremities symmetrically and with good coordination.  Psychiatric:        Behavior: Behavior normal.        Thought Content: Thought content normal.        Judgment: Judgment normal.      ED Treatments / Results  Labs (all labs ordered are listed, but only abnormal results are displayed) Labs Reviewed  CBC WITH DIFFERENTIAL/PLATELET  BASIC METABOLIC PANEL  BRAIN NATRIURETIC PEPTIDE  BLOOD GAS, VENOUS  I-STAT TROPONIN, ED    EKG EKG Interpretation  Date/Time:  Friday January 24 2018 14:52:54 EST Ventricular Rate:  75 PR Interval:    QRS Duration: 148 QT Interval:  381 QTC Calculation: 426 R Axis:   62 Text Interpretation:  Sinus rhythm Atrial premature complexes Probable left atrial enlargement Left ventricular hypertrophy `st dep inf/lat, increased from prior Confirmed by Lajean Saver [phone]) on 01/24/2018 2:58:53 PM   Radiology No results found.  Procedures Procedures (including critical care time)  Medications Ordered in ED Medications - No data to display   Initial Impression / Assessment and Plan / ED Course  I have  reviewed the triage vital signs and the nursing notes.  Pertinent labs & imaging results that were available during my care of the patient were reviewed by me and considered in my medical decision making (see chart for details).  Clinical Course as of Jan 25 1551  Fri Jan 24, 2018  1552 Stable.   Hemoglobin(!): 11.2 [AM]    Clinical Course User Index [AM] Albesa Seen, PA-C    Patient is overall nontoxic-appearing, hemodynamically stable, presents tachypneic and initially her oxygen saturation was 76%.  Her home oxygen tubing was found to be kinked.  When patient was placed on nasal cannula 4 L, her oxygen improved to 95 to 100%, however she remained tachypneic with accessory muscle use.  She is speaking in partial sentences, but reports improvement in no respiratory distress.  Patient not felt any BiPAP initially.  Differential diagnosis includes HF exacerbation, reactive airway disease, COPD exacerbation, pneumonia, PE, ACS, cardiac tamponade, retropharyngeal abscess, neuromuscular causes (AIDP, myasthenia gravis, wound botulism, anemia, toxicologic (CO toxicity, MetHb, ASA overdose), metabolic acidosis.  Given patient decreased lung sounds and history of COPD, will initially try nebulizer therapy.  Work-up significant for normal troponin.  Patient's EKG shows some slight increase in ischemic changes with downsloping ST segments in the inferior leads.  This may be demand ischemia in the setting of increased shortness of breath.  Patient's hemoglobin is stable.  BNP is pending.  Patient's blood gases demonstrating normal pH and PCO2.   Care signed out to Domenic Moras, PA-C at 4:14 PM to reassess after nebulizer treatment and admit. If not improving and shortness of breath appearing out of proportion to objective findings, considered PE scan, although exacerbation of chronic disease seems more likely.  This is a shared visit with Dr. Lajean Saver. Patient was independently evaluated by this  attending physician. Attending physician consulted in evaluation management.  Final Clinical Impressions(s) / ED Diagnoses   Final diagnoses:  SOB (shortness of breath)    ED Discharge Orders    None       Tamala Julian 01/24/18 1618    Lajean Saver, MD 01/25/18 [phone]

## 2018-01-24 NOTE — H&P (Signed)
History and Physical    [name]:[id number] DOB: 10-Mar-1925 DOA: 01/24/2018  PCP: Leonard Downing, MD   Patient coming from: Home  I have personally briefly reviewed patient's old medical records in [place]  Chief Complaint: Weak, tired, worn out and short of breath  HPI: [name] is a 85 y.o. female with medical history significant of COPD, CHF, combined diastolic and systolic congestive heart failure with an ejection fraction of 30 to 35%, hypertension, hypothyroidism, who presents with shortness of breath.  Only had a 7-day stay from January 2 to January 09, 2022 shortness of breath and charge diagnoses were acute hypoxic respiratory distress acute mild COPD exacerbation, acute on chronic systolic congestive heart failure, and mild COPD exacerbation.  Patient also has chronic kidney disease stage III.  Dated that when she got home she felt okay for about a week then started feeling more poorly.  Yesterday she went to the doctor's office the doctor drew some blood and examined her, made no specific changes but after return home the patient felt worn out.  Patient stated to me that going out of the house to go to doctors offices has become more and more difficult for her and she would prefer not to do that.  Patient is able to speak in full sentences but is clearly doing accessory muscles to breathe.  Patient's daughter states that this is her normal recently.  Patient is on her usual dose of oxygen while here in the emergency department but still feels fatigued and short of breath.  She denies fevers, chills, cough, sputum production and acute worsening of symptoms.  ED Course: Found to have decreased lung sounds consistent with history of COPD, tried some nebulizer treatments without significant response.  Work-up significant for normal troponin and unchanged EKG and BNP which appears improved compared with prior  Review of Systems: As per HPI otherwise all other systems  reviewed and  negative.    Past Medical History:  Diagnosis Date  . Anemia    years ago  . Arthritis    "right shoulder" (01/02/2012)  . Bladder mass   . CAP (community acquired pneumonia) 01/02/2012   Lower lobe   . Chronic combined systolic and diastolic CHF (congestive heart failure) ([place])   . Chronic respiratory failure ([place])   . CKD (chronic kidney disease), stage III ([place])    by labs  . COPD (chronic obstructive pulmonary disease) ([place])   . Diverticulitis of colon with perforation 01/14/2012   That is post sigmoid resection with [facility] pouch and end colostomy   . GERD (gastroesophageal reflux disease)    occasional  . Goiter    radioactive iodine ablation/notes 01/02/2012 - large compressing pharynx  . History of kidney stones    X 1  . Hypertension   . Hypothyroidism    "I have taken Synthroid before" (01/02/2012)   HAS HAD RADIOACTIVE IODINE TX 2 YR S AGO  . Intra-abdominal abscess ([place]) 01/14/2012  . NSVT (nonsustained ventricular tachycardia) ([place])   . PAF (paroxysmal atrial fibrillation) ([place])   . Pneumatosis of intestines 01/14/2012  . Pneumonia 01/02/2012; ? 2009  . PVC's (premature ventricular contractions)   . Radiation 08/10/13-09/15/13   57 gray to vaginal/bladder mass  . Stroke [facility])     Past Surgical History:  Procedure Laterality Date  . ABDOMINAL HYSTERECTOMY  1980's  . APPENDECTOMY  1980's   "when they did hysterectomy" (01/02/2012)  . CATARACT EXTRACTION W/ INTRAOCULAR LENS  IMPLANT,  BILATERAL  ?1990's   [place]il  . COLON SURGERY    . COLOSTOMY N/A 03/12/2012   Procedure: COLOSTOMY;  Surgeon: Ralene Ok, MD;  Location: [place];  Service: General;  Laterality: N/A;  . COLOSTOMY REVISION N/A 03/12/2012   Procedure: COLON RESECTION SIGMOID ;  Surgeon: Ralene Ok, MD;  Location: [place];  Service: General;  Laterality: N/A;  . COLOSTOMY TAKEDOWN N/A 10/21/2012   Procedure: LAPAROSCOPIC COLOSTOMY TAKEDOWN AND HARTMANS ANASTOMOSIS, rigid proctoscopy;  Surgeon:  Ralene Ok, MD;  Location: [facility];  Service: General;  Laterality: N/A;  . EYE SURGERY    . LYSIS OF ADHESION N/A 10/21/2012   Procedure: LYSIS OF ADHESION;  Surgeon: Ralene Ok, MD;  Location: [facility];  Service: General;  Laterality: N/A;  . TRANSURETHRAL RESECTION OF BLADDER TUMOR WITH GYRUS (TURBT-GYRUS) N/A 06/30/2013   Procedure: TRANSURETHRAL RESECTION OF BLADDER TUMOR WITH GYRUS (TURBT-GYRUS)/VAGINAL BIOPSY;  Surgeon: Ardis Hughs, MD;  Location: [facility];  Service: Urology;  Laterality: N/A;    Social History   Social History Narrative  . Not on file     reports that she quit smoking about 27 years ago. Her smoking use included cigarettes. She has a 4.20 pack-year smoking history. She has never used smokeless tobacco. She reports that she does not drink alcohol or use drugs.  Allergies  Allergen Reactions  . Indomethacin Anaphylaxis and Other (See Comments)    "took it fine for awhile; one day I stopped breathing and I ended up in the hospital" (01/02/2012) Pt has tolerated aspirin    Family History  Problem Relation Age of Onset  . Cancer Sister        Breast  . Stroke Sister   . Heart disease Mother   . Heart disease Other      Prior to Admission medications   Medication Sig Start Date End Date Taking? Authorizing Provider  acetaminophen (TYLENOL) 325 MG tablet Take 2 tablets (650 mg total) by mouth every 4 (four) hours as needed for headache or mild pain. 11/15/17   Bonnell Public, MD  albuterol (PROVENTIL HFA;VENTOLIN HFA) 108 (90 BASE) MCG/ACT inhaler Inhale 2 puffs into the lungs every 6 (six) hours as needed for wheezing.    [provider]  albuterol (PROVENTIL) (2.5 MG/3ML) 0.083% nebulizer solution Take 3 mLs (2.5 mg total) by nebulization every 6 (six) hours as needed for wheezing or shortness of breath. 12/17/15   Elgergawy, Silver Huguenin, MD  apixaban (ELIQUIS) 5 MG TABS tablet Take 1 tablet (5 mg total) by mouth 2 (two) times daily.  08/28/15   Ghimire, Henreitta Leber, MD  cetirizine (ZYRTEC) 10 MG tablet Take 10 mg by mouth daily as needed for allergies.     [provider]  cholecalciferol (VITAMIN D) 1000 units tablet Take 1,000 Units by mouth daily.    [provider]  docusate sodium (COLACE) 100 MG capsule Take 1 capsule (100 mg total) by mouth 2 (two) times daily. Patient taking differently: Take 100 mg by mouth 2 (two) times daily as needed for mild constipation.  05/01/17   Oretha Milch D, MD  ferrous sulfate 325 (65 FE) MG tablet Take 325 mg by mouth 2 (two) times daily with a meal.     [provider]  fluticasone (FLONASE) 50 MCG/ACT nasal spray Place 1 spray into both nostrils daily as needed for allergies.  07/19/17   [provider]  furosemide (LASIX) 40 MG tablet Take 1 tablet (40 mg total) by  mouth 2 (two) times daily. 11/15/17   Bonnell Public, MD  mometasone-formoterol (DULERA) 100-5 MCG/ACT AERO Inhale 2 puffs into the lungs 2 (two) times daily. 11/15/17   Dana Allan I, MD  pantoprazole (PROTONIX) 40 MG tablet Take 1 tablet (40 mg total) by mouth daily. 08/14/17   Fenton Foy, NP  polyethylene glycol (MIRALAX / GLYCOLAX) packet Take 17 g by mouth daily as needed for mild constipation.     [provider]  polyvinyl alcohol (LIQUIFILM TEARS) 1.4 % ophthalmic solution Place 1 drop into both eyes as needed for dry eyes.    [provider]  spironolactone (ALDACTONE) 25 MG tablet Take 0.5 tablets (12.5 mg total) by mouth daily. 01/10/18 02/09/18  Damita Lack, MD    Physical Exam:  Constitutional: NAD, calm, comfortable Vitals:   01/24/18 1451 01/24/18 1453 01/24/18 1500 01/24/18 1515  BP:  (!) 138/55 (!) 107/58 101/70  Pulse:  79 73 69  Resp:  (!) 21 (!) 26 19  Temp:  97.9 F (36.6 C)    TempSrc:  Oral    SpO2:  97% 100% 99%  Weight: 68.6 kg     Height: 5' (1.524 m)      Eyes: PERRL, lids and conjunctivae normal ENMT: Mucous  membranes are moist. Posterior pharynx clear of any exudate or lesions.Normal dentition.  Neck: normal, supple, no masses, no thyromegaly Respiratory:  accessory muscle use with decreased breath sounds bilaterally and faint expiratory wheezing.  Cardiovascular: Regular rate and rhythm, no murmurs / rubs / gallops. No extremity edema. 2+ pedal pulses. No carotid bruits.  Abdomen: no tenderness, no masses palpated. No hepatosplenomegaly. Bowel sounds positive.  Musculoskeletal: no clubbing / cyanosis. No joint deformity upper and lower extremities. Good ROM, no contractures. Normal muscle tone.  Skin: no rashes, lesions, ulcers. No induration Neurologic: CN 2-12 grossly intact. Sensation intact, DTR normal. Strength 5/5 in all 4.  Psychiatric: Normal judgment and insight. Alert and oriented x 3. Normal mood.    Labs on Admission: I have personally reviewed following labs and imaging studies  CBC: Recent Labs  Lab 01/24/18 1455 01/24/18 1509  WBC 4.4  --   NEUTROABS 3.2  --   HGB 11.2* 11.9*  HCT 34.6* 35.0*  MCV 99.1  --   PLT 226  --    Basic Metabolic Panel: Recent Labs  Lab 01/24/18 1455 01/24/18 1509  NA 140 140  K 4.1 4.1  CL 98  --   CO2 32  --   GLUCOSE 98  --   BUN 17  --   CREATININE 1.05*  --   CALCIUM 9.0  --    GFR: Estimated Creatinine Clearance: 30.8 mL/min (A) (by C-G formula based on SCr of 1.05 mg/dL (H)). Urine analysis:    Component Value Date/Time   COLORURINE YELLOW 07/22/2017 [place] 07/22/2017 1204   LABSPEC 1.021 07/22/2017 1204   LABSPEC 1.010 10/29/2013 1105   PHURINE 6.0 07/22/2017 1204   GLUCOSEU NEGATIVE 07/22/2017 1204   GLUCOSEU Negative 10/29/2013 1105   HGBUR NEGATIVE 07/22/2017 1204   BILIRUBINUR NEGATIVE 07/22/2017 1204   BILIRUBINUR Negative 10/29/2013 1105   KETONESUR NEGATIVE 07/22/2017 1204   PROTEINUR 30 (A) 07/22/2017 1204   UROBILINOGEN 1.0 02/13/2014 1137   UROBILINOGEN 0.2 10/29/2013 1105   NITRITE  NEGATIVE 07/22/2017 1204   LEUKOCYTESUR SMALL (A) 07/22/2017 1204   LEUKOCYTESUR Trace 10/29/2013 1105    Radiological Exams on Admission: Dg Chest Port 1 [address]  Result Date: 01/24/2018 CLINICAL DATA:  Shortness of breath. EXAM: PORTABLE CHEST 1 VIEW COMPARISON:  01/04/2018.  08/14/2017. FINDINGS: Mediastinal widening with mediastinal calcifications again noted consistent with calcified goiter. Similar findings noted on prior exam. Stable cardiomegaly. Mild left base atelectasis/infiltrate. Bilateral pulmonary interstitial prominence. Findings suggest chronic interstitial lung disease again noted. No pleural effusion or pneumothorax. Upper mediastinal calcific densities most likely within the IMPRESSION: 1.  Stable cardiomegaly. 2.  Mild left base atelectasis/infiltrate. 3. Bilateral pulmonary interstitial prominence again noted. Findings suggest chronic interstitial lung disease. 4. Stable mediastinal widening with prominent calcifications consistent with goiter. Similar findings noted on prior exams. Electronically Signed   By: Marcello Moores  Register   On: 01/24/2018 15:15    EKG: Independently reviewed.  Sinus rhythm with apical premature complexes, left atrial enlargement, and left ventricular hypertrophy when compared to prior there is no change  Assessment/Plan Principal Problem:   Admission for palliative care Active Problems:   COPD (chronic obstructive pulmonary disease) (HCC)   Acute on chronic combined systolic and diastolic congestive heart failure ([place])   1.  Admission for palliative care: After discussion with the patient and her daughter it is clear that the patient is getting tired of coming back and forth to the hospital.  She would like her symptoms treated she understands that she is "wearing out".  She even mention to me that she knows her may not be a lot that can be done for her she just wants to feel a little bit more comfortable.  When I discussed with her the difference between  getting better and feeling better she really wants to feel better.  She realizes she cannot get better.  Patient will be admitted into the hospital we will treat her for a COPD exacerbation but also order palliative medications for symptom relief.  I will consult palliative care for assistance with symptom management.  We will get in touch with hospice so they can meet with the patient and her daughter and set up hospice care through that she can discharge home with hospice in place.  2.  Acute exacerbation of COPD: Treatments and medications for relief of symptoms as able please see orders.  3.  Acute on chronic combined systolic and diastolic congestive heart failure: Patient with depressed ejection fraction we will continue home medications and provide symptomatic relief as above.   DVT prophylaxis: None comfort care Code Status: Do not attempt resuscitation Family Communication: Spoke with patient and her daughter who was present at the time of admission.  Daughter is fully in agreement with plans for palliative care and DO NOT RESUSCITATE status. Disposition Plan: Home with hospice care Consults called: Palliative care Admission status: Observation   Lady Deutscher MD FACP [facility] Pager [phone]  If 7PM-7AM, please contact night-coverage [email or web address] Password [email or web address]  01/24/2018, 5:20 PM

## 2018-01-25 ENCOUNTER — Encounter (HOSPITAL_COMMUNITY): Payer: Self-pay

## 2018-01-25 ENCOUNTER — Other Ambulatory Visit: Payer: Self-pay

## 2018-01-25 DIAGNOSIS — I5043 Acute on chronic combined systolic (congestive) and diastolic (congestive) heart failure: Secondary | ICD-10-CM | POA: Diagnosis not present

## 2018-01-25 DIAGNOSIS — Z7189 Other specified counseling: Secondary | ICD-10-CM

## 2018-01-25 DIAGNOSIS — R0602 Shortness of breath: Secondary | ICD-10-CM

## 2018-01-25 DIAGNOSIS — J441 Chronic obstructive pulmonary disease with (acute) exacerbation: Principal | ICD-10-CM

## 2018-01-25 DIAGNOSIS — Z515 Encounter for palliative care: Secondary | ICD-10-CM | POA: Diagnosis not present

## 2018-01-25 LAB — HIV ANTIBODY (ROUTINE TESTING W REFLEX): HIV Screen 4th Generation wRfx: NONREACTIVE

## 2018-01-25 MED ORDER — DOCUSATE SODIUM 50 MG/5ML PO LIQD
100.0000 mg | Freq: Two times a day (BID) | ORAL | Status: DC
  Administered 2018-01-25 (×2): 100 mg via ORAL
  Filled 2018-01-25 (×3): qty 10

## 2018-01-25 NOTE — Progress Notes (Signed)
Nutrition Brief Note  Chart reviewed. Pt now transitioning to comfort care. Per MD notes, plan for palliative care consult. Hospitalization is for symptom management only.  No further nutrition interventions warranted at this time.  Please re-consult as needed.   Zedric Deroy A. Jimmye Norman, RD, LDN, CDE Pager: [phone] After hours Pager: [phone]

## 2018-01-25 NOTE — Progress Notes (Signed)
PROGRESS NOTE  [name]  GLO:[id number] DOB: Jan 04, 1925  DOA: 01/24/2018 PCP: Leonard Downing, MD   Brief Narrative:  [name] is a 85 y.o. female with medical history significant for but not limited to COPD, CKD 3 CHF, combined diastolic and systolic congestive heart failure with an ejection fraction of 30 to 35%, hypertension, hypothyroidism presenting with shortness of breath after recent 7-day admission from 01/03/2020 01/09/2018.  ED evaluation revealed acute COPD with decompensated CHF.  Patient is getting tired of coming back and forth from hospital and palliative care requested   Assessment & Plan:   Principal Problem:   Admission for palliative care Active Problems:   COPD (chronic obstructive pulmonary disease) (HCC)   SOB (shortness of breath)   Acute on chronic combined systolic and diastolic congestive heart failure (HCC)   Acute exacerbation of chronic obstructive pulmonary disease (COPD) ([place])   Goals of care, counseling/discussion   Palliative care by specialist   Admission for palliative care with acute on chronic systolic and diastolic CHF with COPD Treatments and medications for symptom relief Supportive care Palliative medicine consult  DVT prophylaxis: None (comfort care) Code Status: DNR Family Communication:  Disposition Plan: Home with hospice care   Consultants:   Palliative medicine Procedures:     Antimicrobials:      Subjective:  Feels better, no chest pain Objective:  Vitals:   01/24/18 2007 01/25/18 0201 01/25/18 0514 01/25/18 0900  BP: 117/61  135/67   Pulse: 67  68 70  Resp: (!) 36  (!) 30 18  Temp:   (!) 97.5 F (36.4 C)   TempSrc:   Oral   SpO2: 100% 98% 99% 97%  Weight:      Height:       No intake or output data in the 24 hours ending 01/25/18 1222 Filed Weights   01/24/18 1451 01/24/18 1959  Weight: 68.6 kg 64.9 kg    Examination:  General exam: No acute distress Respiratory system: Mildly  diminished breath sounds with few occasional wheezing,  respiratory effort normal. Cardiovascular system: S1 & S2 heard, RRR. No JVD, murmurs, rubs, gallops or clicks. No pedal edema. Gastrointestinal system: Abdomen is nondistended, soft and nontender. No organomegaly or masses felt. Normal bowel sounds heard. Central nervous system: Alert and oriented. No focal neurological deficits. Extremities: Symmetric 5 x 5 power. Skin: No rashes, lesions or ulcers Psychiatry: Judgement and insight appear normal. Mood & affect appropriate.     Data Reviewed: I have personally reviewed following labs and imaging studies  CBC: Recent Labs  Lab 01/24/18 1455 01/24/18 1509  WBC 4.4  --   NEUTROABS 3.2  --   HGB 11.2* 11.9*  HCT 34.6* 35.0*  MCV 99.1  --   PLT 226  --    Basic Metabolic Panel: Recent Labs  Lab 01/24/18 1455 01/24/18 1509  NA 140 140  K 4.1 4.1  CL 98  --   CO2 32  --   GLUCOSE 98  --   BUN 17  --   CREATININE 1.05*  --   CALCIUM 9.0  --    GFR: Estimated Creatinine Clearance: 30 mL/min (A) (by C-G formula based on SCr of 1.05 mg/dL (H)). Liver Function Tests: No results for input(s): AST, ALT, ALKPHOS, BILITOT, PROT, ALBUMIN in the last 168 hours. No results for input(s): LIPASE, AMYLASE in the last 168 hours. No results for input(s): AMMONIA in the last 168 hours. Coagulation Profile: No results for input(s): INR,  PROTIME in the last 168 hours. Cardiac Enzymes: No results for input(s): CKTOTAL, CKMB, CKMBINDEX, TROPONINI in the last 168 hours. BNP (last 3 results) No results for input(s): PROBNP in the last 8760 hours. HbA1C: No results for input(s): HGBA1C in the last 72 hours. CBG: No results for input(s): GLUCAP in the last 168 hours. Lipid Profile: No results for input(s): CHOL, HDL, LDLCALC, TRIG, CHOLHDL, LDLDIRECT in the last 72 hours. Thyroid Function Tests: No results for input(s): TSH, T4TOTAL, FREET4, T3FREE, THYROIDAB in the last 72  hours. Anemia Panel: No results for input(s): VITAMINB12, FOLATE, FERRITIN, TIBC, IRON, RETICCTPCT in the last 72 hours.  Sepsis Labs: Recent Labs  Lab 01/24/18 1455  WBC 4.4    No results found for this or any previous visit (from the past 240 hour(s)).       Radiology Studies: Dg Chest Port 1 View  Result Date: 01/24/2018 CLINICAL DATA:  Shortness of breath. EXAM: PORTABLE CHEST 1 VIEW COMPARISON:  01/04/2018.  08/14/2017. FINDINGS: Mediastinal widening with mediastinal calcifications again noted consistent with calcified goiter. Similar findings noted on prior exam. Stable cardiomegaly. Mild left base atelectasis/infiltrate. Bilateral pulmonary interstitial prominence. Findings suggest chronic interstitial lung disease again noted. No pleural effusion or pneumothorax. Upper mediastinal calcific densities most likely within the IMPRESSION: 1.  Stable cardiomegaly. 2.  Mild left base atelectasis/infiltrate. 3. Bilateral pulmonary interstitial prominence again noted. Findings suggest chronic interstitial lung disease. 4. Stable mediastinal widening with prominent calcifications consistent with goiter. Similar findings noted on prior exams. Electronically Signed   By: [place]   On: 01/24/2018 15:15        Scheduled Meds: . apixaban  2.5 mg Oral BID  . cholecalciferol  1,000 Units Oral Daily  . docusate  100 mg Oral BID  . furosemide  40 mg Oral BID  . ipratropium-albuterol  3 mL Nebulization Q6H  . loratadine  10 mg Oral Daily  . mometasone-formoterol  2 puff Inhalation BID  . pantoprazole  40 mg Oral Daily  . [START ON 01/26/2018] predniSONE  40 mg Oral Q breakfast  . sodium chloride flush  3 mL Intravenous Q12H  . spironolactone  12.5 mg Oral Daily   Continuous Infusions: . chlorproMAZINE (THORAZINE) IV       LOS: 0 days    Time spent: Young, MD [facility] Pager [phone]  If 7PM-7AM, please contact  night-coverage [email or web address] Password [facility] 01/25/2018, 12:22 PM

## 2018-01-25 NOTE — Consult Note (Signed)
Consultation Note Date: 01/25/2018   Patient Name: [name]  DOB: 1925/04/19  MRN: [id number]  Age / Sex: 85 y.o., female  PCP: Leonard Downing, MD Referring Physician: Benito Mccreedy, MD  Reason for Consultation: Establishing goals of care, Hospice Evaluation, Non pain symptom management and Psychosocial/spiritual support  HPI/Patient Profile: 85 y.o. female  with past medical history of end-stage COPD, combined congestive heart failure with an ejection fraction 30 to 35%, hypertension, hypothyroidism, chronic kidney disease stage III, history of bowel perforation secondary to diverticulitis with colostomy and subsequent takedown 2014, atrial fib admitted on 01/24/2018 with worsening shortness of breath.  Patient Was recently admitted to the hospital 01/03/2018 for similar presentation.  She was discharged home and stated she felt fairly good for 1 week and then began to get more short of breath.  Her shortness of breath has progressed to the point where it is difficult for her to do anything around the house without feeling very weak and short of breath.  She also shares that it is becoming very difficult to go to her doctor.  Consult ordered for goals of care and hospice evaluation.   Clinical Assessment and Goals of Care: Patient seen, chart reviewed.  Met with patient and patient's daughter [name].  Patient and her daughter seem to have a good grasp of underlying disease progression related to heart failure as well as COPD.  Patient as well as patient's children are wanting to focus on comfort, symptom management versus ongoing hospitalizations.  I did review benefits of hospice as well as what hospice is unable to provide specifically, no IV fluids, no IV antibiotics, no transfusions in either the home setting or hospice hospital setting.  Both verbalized understanding and willingness to  proceed  I also introduced the topic of opioids to manage not only pain but as rescue option for acute shortness of breath.  We discussed at length the risk and benefits associated with opioids.  Patient and family verbalized understanding and was appreciative of knowing that there was an additional option to manage acute shortness of breath  Patient at this point is able to speak for herself and participate in goals of care.  Her healthcare proxy would be her daughter, [name], [phone]    SUMMARY OF RECOMMENDATIONS   Confirmed DNR DNI DNR completed and on chart Consult placed to care management for hospice services in the home.  Offered choice of hospice affiliation per Medicare guidelines: Family elects [facility] the [place].  Have phoned hosp[facility] of the [facility] liaison directly and they will work on admitting patient later today. Recommend that patient not be discharged until she has a hospital bed and oxygen concentrator in the home as well as being officially affiliated with hospice to avoid potential readmission Code Status/Advance Care Planning:  DNR    Symptom Management:   Dyspnea: Introduced the topic of opioids to manage acute shortness of breath.  Continue with morphine IV 1 mg every 2 hours as needed; morphine concentrate 5  mg orally every 2 hours as needed.  Continue targeted pulmonary treatments such as oxygen, Lasix, Solu-Medrol  Anxiety: Continue Ativan as needed  Palliative Prophylaxis:   Aspiration, Bowel Regimen, Delirium Protocol, Eye Care, Frequent Pain Assessment, Oral Care and Turn Reposition  Additional Recommendations (Limitations, Scope, Preferences):  Avoid Hospitalization, Minimize Medications, Initiate Comfort Feeding, No Artificial Feeding, No Blood Transfusions, No Chemotherapy, No Diagnostics, No Glucose Monitoring, No Hemodialysis, No IV Antibiotics, No IV Fluids, No Lab Draws, No Radiation, No Surgical Procedures and No  Tracheostomy  Psycho-social/Spiritual:   Desire for further Chaplaincy support:no  Additional Recommendations: Referral to Community Resources   Prognosis:  < 6 months the setting of progressive COPD with recurrent hospitalizations twice in January, combined congestive heart failure ejection fraction 30 to 35%, chronic kidney disease stage III, functional decline secondary to dyspnea on exertion exhibited by inability to walk but short distances within her house with the aid of a walker and having to sit down and rest, poor appetite secondary to shortness of breath, only eating bites and sips Discharge Planning: Home with Hospice      Primary Diagnoses: Present on Admission: . Acute on chronic combined systolic and diastolic congestive heart failure ([place]) . COPD (chronic obstructive pulmonary disease) ([place]) . Acute exacerbation of chronic obstructive pulmonary disease (COPD) ([place])   I have reviewed the medical record, interviewed the patient and family, and examined the patient. The following aspects are pertinent.  Past Medical History:  Diagnosis Date  . Anemia    years ago  . Arthritis    "right shoulder" (01/02/2012)  . Bladder mass   . CAP (community acquired pneumonia) 01/02/2012   Lower lobe   . Chronic combined systolic and diastolic CHF (congestive heart failure) ([place])   . Chronic respiratory failure ([place])   . CKD (chronic kidney disease), stage III ([place])    by labs  . COPD (chronic obstructive pulmonary disease) ([place])   . Diverticulitis of colon with perforation 01/14/2012   That is post sigmoid resection with [facility] pouch and end colostomy   . GERD (gastroesophageal reflux disease)    occasional  . Goiter    radioactive iodine ablation/notes 01/02/2012 - large compressing pharynx  . History of kidney stones    X 1  . Hypertension   . Hypothyroidism    "I have taken Synthroid before" (01/02/2012)   HAS HAD RADIOACTIVE IODINE TX 2 YR S AGO  . Intra-abdominal abscess  ([place]) 01/14/2012  . NSVT (nonsustained ventricular tachycardia) ([place])   . PAF (paroxysmal atrial fibrillation) ([place])   . Pneumatosis of intestines 01/14/2012  . Pneumonia 01/02/2012; ? 2009  . PVC's (premature ventricular contractions)   . Radiation 08/10/13-09/15/13   57 gray to vaginal/bladder mass  . Stroke [facility])    Social History   Socioeconomic History  . Marital status: Widowed    Spouse name: Not on file  . Number of children: 6  . Years of education: Not on file  . Highest education level: Not on file  Occupational History  . Occupation: retired  Scientific laboratory technician  . Financial resource strain: Not on file  . Food insecurity:    Worry: Not on file    Inability: Not on file  . Transportation needs:    Medical: Not on file    Non-medical: Not on file  Tobacco Use  . Smoking status: Former Smoker    Packs/day: 0.12    Years: 35.00    Pack years: 4.20    Types: Cigarettes  Last attempt to quit: 01/02/1991    Years since quitting: 27.0  . Smokeless tobacco: Never Used  Substance and Sexual Activity  . Alcohol use: No  . Drug use: No  . Sexual activity: Never  Lifestyle  . Physical activity:    Days per week: Not on file    Minutes per session: Not on file  . Stress: Not on file  Relationships  . Social connections:    Talks on phone: Not on file    Gets together: Not on file    Attends religious service: Not on file    Active member of club or organization: Not on file    Attends meetings of clubs or organizations: Not on file    Relationship status: Not on file  Other Topics Concern  . Not on file  Social History Narrative  . Not on file   Family History  Problem Relation Age of Onset  . Cancer Sister        Breast  . Stroke Sister   . Heart disease Mother   . Heart disease Other    Scheduled Meds: . apixaban  2.5 mg Oral BID  . cholecalciferol  1,000 Units Oral Daily  . docusate sodium  100 mg Oral BID  . furosemide  40 mg Oral BID  .  ipratropium-albuterol  3 mL Nebulization Q6H  . loratadine  10 mg Oral Daily  . methylPREDNISolone (SOLU-MEDROL) injection  60 mg Intravenous Q6H   Followed by  . [START ON 01/26/2018] predniSONE  40 mg Oral Q breakfast  . mometasone-formoterol  2 puff Inhalation BID  . pantoprazole  40 mg Oral Daily  . sodium chloride flush  3 mL Intravenous Q12H  . spironolactone  12.5 mg Oral Daily   Continuous Infusions: . chlorproMAZINE (THORAZINE) IV     PRN Meds:.acetaminophen, albuterol, alum & mag hydroxide-simeth, antiseptic oral rinse, chlorproMAZINE (THORAZINE) IV, diphenhydrAMINE, fluticasone, glycopyrrolate **OR** glycopyrrolate **OR** glycopyrrolate, haloperidol **OR** haloperidol **OR** haloperidol lactate, LORazepam **OR** LORazepam **OR** LORazepam, morphine injection, morphine CONCENTRATE **OR** morphine CONCENTRATE, ondansetron **OR** ondansetron (ZOFRAN) IV, polyethylene glycol, polyvinyl alcohol Medications Prior to Admission:  Prior to Admission medications   Medication Sig Start Date End Date Taking? Authorizing Provider  acetaminophen (TYLENOL) 325 MG tablet Take 2 tablets (650 mg total) by mouth every 4 (four) hours as needed for headache or mild pain. 11/15/17  Yes Bonnell Public, MD  albuterol (PROVENTIL HFA;VENTOLIN HFA) 108 (90 BASE) MCG/ACT inhaler Inhale 2 puffs into the lungs every 6 (six) hours as needed for wheezing.   Yes [provider]  albuterol (PROVENTIL) (2.5 MG/3ML) 0.083% nebulizer solution Take 3 mLs (2.5 mg total) by nebulization every 6 (six) hours as needed for wheezing or shortness of breath. 12/17/15  Yes Elgergawy, Silver Huguenin, MD  apixaban (ELIQUIS) 5 MG TABS tablet Take 1 tablet (5 mg total) by mouth 2 (two) times daily. Patient taking differently: Take 2.5 mg by mouth 2 (two) times daily.  08/28/15  Yes Ghimire, Henreitta Leber, MD  cetirizine (ZYRTEC) 10 MG tablet Take 10 mg by mouth daily as needed for allergies.    Yes [provider]   cholecalciferol (VITAMIN D) 1000 units tablet Take 1,000 Units by mouth daily.   Yes [provider]  docusate sodium (COLACE) 100 MG capsule Take 1 capsule (100 mg total) by mouth 2 (two) times daily. Patient taking differently: Take 100 mg by mouth 2 (two) times daily as needed for mild constipation.  05/01/17  Yes  Oretha Milch D, MD  ferrous sulfate 325 (65 FE) MG tablet Take 325 mg by mouth 2 (two) times daily with a meal.    Yes [provider]  fluticasone (FLONASE) 50 MCG/ACT nasal spray Place 1 spray into both nostrils daily as needed for allergies.  07/19/17  Yes [provider]  furosemide (LASIX) 40 MG tablet Take 1 tablet (40 mg total) by mouth 2 (two) times daily. 11/15/17  Yes Bonnell Public, MD  mometasone-formoterol (DULERA) 100-5 MCG/ACT AERO Inhale 2 puffs into the lungs 2 (two) times daily. 11/15/17  Yes Dana Allan I, MD  pantoprazole (PROTONIX) 40 MG tablet Take 1 tablet (40 mg total) by mouth daily. 08/14/17  Yes Fenton Foy, NP  polyethylene glycol (MIRALAX / GLYCOLAX) packet Take 17 g by mouth daily as needed for mild constipation.    Yes [provider]  polyvinyl alcohol (LIQUIFILM TEARS) 1.4 % ophthalmic solution Place 1 drop into both eyes as needed for dry eyes.   Yes [provider]  spironolactone (ALDACTONE) 25 MG tablet Take 0.5 tablets (12.5 mg total) by mouth daily. 01/10/18 02/09/18 Yes Damita Lack, MD   Allergies  Allergen Reactions  . Indomethacin Anaphylaxis and Other (See Comments)    "took it fine for awhile; one day I stopped breathing and I ended up in the hospital" (01/02/2012) Pt has tolerated aspirin   Review of Systems  Unable to perform ROS: Other    Physical Exam Vitals signs and nursing note reviewed.  Constitutional:      Appearance: Normal appearance.  HENT:     Head: Normocephalic and atraumatic.  Neck:     Musculoskeletal: Normal range of motion.  Cardiovascular:      Rate and Rhythm: Normal rate.  Pulmonary:     Comments: Mild increased work of breathing noted at rest Genitourinary:    Comments: Incontinent Pure wick Musculoskeletal: Normal range of motion.  Skin:    General: Skin is warm and dry.  Neurological:     Mental Status: She is alert and oriented to person, place, and time.  Psychiatric:        Mood and Affect: Mood normal.     Comments: Anxious     Vital Signs: BP 135/67 (BP Location: Right Arm)   Pulse 70   Temp (!) 97.5 F (36.4 C) (Oral)   Resp 18   Ht 5' (1.524 m)   Wt 64.9 kg   SpO2 97%   BMI 27.94 kg/m  Pain Scale: 0-10   Pain Score: 0-No pain   SpO2: SpO2: 97 % O2 Device:SpO2: 97 % O2 Flow Rate: .O2 Flow Rate (L/min): 3 L/min  IO: Intake/output summary: No intake or output data in the 24 hours ending 01/25/18 1039  LBM: Last BM Date: 01/23/18 Baseline Weight: Weight: 68.6 kg Most recent weight: Weight: 64.9 kg     Palliative Assessment/Data:   Flowsheet Rows     Most Recent Value  Intake Tab  Referral Department  Hospitalist  Unit at Time of Referral  Med/Surg Unit  Palliative Care Primary Diagnosis  Pulmonary  Date Notified  01/24/18  Palliative Care Type  New Palliative care  Reason for referral  Clarify Goals of Care, Non-pain Symptom, Counsel Regarding Hospice, Psychosocial or Spiritual support  Date of Admission  01/24/18  Date first seen by Palliative Care  01/25/18  # of days Palliative referral response time  1 Day(s)  # of days IP prior to Palliative referral  0  Clinical Assessment  Palliative Performance Scale Score  40%  Pain Max last 24 hours  Not able to report  Pain Min Last 24 hours  Not able to report  Dyspnea Max Last 24 Hours  Not able to report  Dyspnea Min Last 24 hours  Not able to report  Nausea Max Last 24 Hours  Not able to report  Nausea Min Last 24 Hours  Not able to report  Anxiety Max Last 24 Hours  Not able to report  Anxiety Min Last 24 Hours  Not able to report   Other Max Last 24 Hours  Not able to report  Psychosocial & Spiritual Assessment  Palliative Care Outcomes  Patient/Family meeting held?  Yes  Who was at the meeting?  pt and dtr [place] Outcomes  Improved non-pain symptom therapy, Counseled regarding hospice, Provided psychosocial or spiritual support, Clarified goals of care, Completed durable DNR  Patient/Family wishes: Interventions discontinued/not started   Mechanical Ventilation, Transfusion, Tube feedings/TPN, PEG, Antibiotics, Hemodialysis, Trach, NIPPV, Vasopressors, BiPAP      Time In: 1000 Time Out: 1050 Time Total: 50 min Greater than 50%  of this time was spent counseling and coordinating care related to the above assessment and plan. Staffed with [facility] respresentative  Signed by: Dory Horn, NP   Please contact Palliative Medicine Team phone at [phone] for questions and concerns.  For individual provider: See Shea Evans

## 2018-01-25 NOTE — Discharge Instructions (Signed)

## 2018-01-26 DIAGNOSIS — Z515 Encounter for palliative care: Secondary | ICD-10-CM | POA: Diagnosis not present

## 2018-01-26 MED ORDER — IPRATROPIUM-ALBUTEROL 0.5-2.5 (3) MG/3ML IN SOLN
3.0000 mL | Freq: Two times a day (BID) | RESPIRATORY_TRACT | Status: DC
  Administered 2018-01-27: 3 mL via RESPIRATORY_TRACT
  Filled 2018-01-26: qty 3

## 2018-01-26 MED ORDER — IPRATROPIUM-ALBUTEROL 0.5-2.5 (3) MG/3ML IN SOLN
3.0000 mL | Freq: Three times a day (TID) | RESPIRATORY_TRACT | Status: DC
  Administered 2018-01-26 (×2): 3 mL via RESPIRATORY_TRACT
  Filled 2018-01-26 (×2): qty 3

## 2018-01-26 MED ORDER — DOCUSATE SODIUM 100 MG PO CAPS: 100.0000 mg | ORAL_CAPSULE | Freq: Two times a day (BID) | ORAL | Status: DC | PRN

## 2018-01-26 NOTE — Care Management (Signed)
[facility] nurse, Yetta Glassman, in to speak with patient around 11am today.  A fully electric hospital bed, over-bed table, oxygen, and wheelchair were delivered to patient's home this afternoon.  Hospice will await d/c and have RN in home for admission visit soon after d/c.

## 2018-01-26 NOTE — Progress Notes (Signed)
PROGRESS NOTE  [name]  IDP:[id number] DOB: 1925-11-07  DOA: 01/24/2018 PCP: Leonard Downing, MD   Brief Narrative:  [name] is a 85 y.o. female with medical history significant for but not limited to COPD, CKD 3 CHF, combined diastolic and systolic congestive heart failure with an ejection fraction of 30 to 35%, hypertension, hypothyroidism presenting with shortness of breath after recent 7-day admission from 01/03/2020 01/09/2018.  ED evaluation revealed acute COPD with decompensated CHF.  Patient is getting tired of coming back and forth from hospital and palliative care requested   Assessment & Plan:   Principal Problem:   Admission for palliative care Active Problems:   COPD (chronic obstructive pulmonary disease) (HCC)   SOB (shortness of breath)   Acute on chronic combined systolic and diastolic congestive heart failure (HCC)   Acute exacerbation of chronic obstructive pulmonary disease (COPD) ([place])   Goals of care, counseling/discussion   Palliative care by specialist   Admission for palliative care with acute on chronic systolic and diastolic CHF with COPD Treatments and medications for symptom relief Supportive care Palliative medicine consult  DVT prophylaxis: None (comfort care) Code Status: DNR Family Communication:  Disposition Plan: Home with hospice care   Consultants:   Palliative medicine Procedures:     Antimicrobials:      Subjective:  Sitting in chair, no new complaints.  Objective:  Vitals:   01/25/18 2031 01/25/18 2052 01/26/18 0440 01/26/18 0843  BP:  117/64 122/65   Pulse:  77 72   Resp:  18 17   Temp:  98.2 F (36.8 C) 97.9 F (36.6 C)   TempSrc:  Oral Oral   SpO2: 94% 99% 100% 99%  Weight:      Height:        Intake/Output Summary (Last 24 hours) at 01/26/2018 1127 Last data filed at 01/26/2018 1111 Gross per 24 hour  Intake 323 ml  Output 1050 ml  Net -727 ml   Filed Weights   01/24/18 1451 01/24/18 1959   Weight: 68.6 kg 64.9 kg    Examination:  General exam: No acute distress Respiratory system: Mildly diminished breath sounds with few occasional wheezing,  respiratory effort normal. Cardiovascular system: S1 & S2 heard, RRR. No JVD, murmurs, rubs, gallops or clicks. No pedal edema. Gastrointestinal system: Abdomen is nondistended, soft and nontender. No organomegaly or masses felt. Normal bowel sounds heard. Central nervous system: Alert and oriented. No focal neurological deficits. Extremities: Symmetric 5 x 5 power. Skin: No rashes, lesions or ulcers Psychiatry: Judgement and insight appear normal. Mood & affect appropriate.     Data Reviewed: I have personally reviewed following labs and imaging studies  CBC: Recent Labs  Lab 01/24/18 1455 01/24/18 1509  WBC 4.4  --   NEUTROABS 3.2  --   HGB 11.2* 11.9*  HCT 34.6* 35.0*  MCV 99.1  --   PLT 226  --    Basic Metabolic Panel: Recent Labs  Lab 01/24/18 1455 01/24/18 1509  NA 140 140  K 4.1 4.1  CL 98  --   CO2 32  --   GLUCOSE 98  --   BUN 17  --   CREATININE 1.05*  --   CALCIUM 9.0  --    GFR: Estimated Creatinine Clearance: 30 mL/min (A) (by C-G formula based on SCr of 1.05 mg/dL (H)). Liver Function Tests: No results for input(s): AST, ALT, ALKPHOS, BILITOT, PROT, ALBUMIN in the last 168 hours. No results for input(s): LIPASE,  AMYLASE in the last 168 hours. No results for input(s): AMMONIA in the last 168 hours. Coagulation Profile: No results for input(s): INR, PROTIME in the last 168 hours. Cardiac Enzymes: No results for input(s): CKTOTAL, CKMB, CKMBINDEX, TROPONINI in the last 168 hours. BNP (last 3 results) No results for input(s): PROBNP in the last 8760 hours. HbA1C: No results for input(s): HGBA1C in the last 72 hours. CBG: No results for input(s): GLUCAP in the last 168 hours. Lipid Profile: No results for input(s): CHOL, HDL, LDLCALC, TRIG, CHOLHDL, LDLDIRECT in the last 72 hours. Thyroid  Function Tests: No results for input(s): TSH, T4TOTAL, FREET4, T3FREE, THYROIDAB in the last 72 hours. Anemia Panel: No results for input(s): VITAMINB12, FOLATE, FERRITIN, TIBC, IRON, RETICCTPCT in the last 72 hours.  Sepsis Labs: Recent Labs  Lab 01/24/18 1455  WBC 4.4    No results found for this or any previous visit (from the past 240 hour(s)).       Radiology Studies: Dg Chest Port 1 View  Result Date: 01/24/2018 CLINICAL DATA:  Shortness of breath. EXAM: PORTABLE CHEST 1 VIEW COMPARISON:  01/04/2018.  08/14/2017. FINDINGS: Mediastinal widening with mediastinal calcifications again noted consistent with calcified goiter. Similar findings noted on prior exam. Stable cardiomegaly. Mild left base atelectasis/infiltrate. Bilateral pulmonary interstitial prominence. Findings suggest chronic interstitial lung disease again noted. No pleural effusion or pneumothorax. Upper mediastinal calcific densities most likely within the IMPRESSION: 1.  Stable cardiomegaly. 2.  Mild left base atelectasis/infiltrate. 3. Bilateral pulmonary interstitial prominence again noted. Findings suggest chronic interstitial lung disease. 4. Stable mediastinal widening with prominent calcifications consistent with goiter. Similar findings noted on prior exams. Electronically Signed   By: [place]   On: 01/24/2018 15:15        Scheduled Meds: . apixaban  2.5 mg Oral BID  . cholecalciferol  1,000 Units Oral Daily  . docusate  100 mg Oral BID  . furosemide  40 mg Oral BID  . ipratropium-albuterol  3 mL Nebulization Q6H  . loratadine  10 mg Oral Daily  . mometasone-formoterol  2 puff Inhalation BID  . pantoprazole  40 mg Oral Daily  . predniSONE  40 mg Oral Q breakfast  . sodium chloride flush  3 mL Intravenous Q12H  . spironolactone  12.5 mg Oral Daily   Continuous Infusions: . chlorproMAZINE (THORAZINE) IV       LOS: 0 days    Time spent:24 mins    Benito Mccreedy, MD [facility] Pager [phone]  If 7PM-7AM, please contact night-coverage [email or web address] Password [facility] 01/26/2018, 11:27 AM

## 2018-01-27 ENCOUNTER — Ambulatory Visit: Admission: RE | Admit: 2018-01-27 | Payer: Medicare Other | Source: Ambulatory Visit | Admitting: Radiation Oncology

## 2018-01-27 DIAGNOSIS — Z515 Encounter for palliative care: Secondary | ICD-10-CM | POA: Diagnosis not present

## 2018-01-27 MED ORDER — LORAZEPAM 2 MG/ML PO CONC: 1.0000 mg | ORAL | 0 refills | Status: AC | PRN

## 2018-01-27 MED ORDER — MORPHINE SULFATE (CONCENTRATE) 10 MG/0.5ML PO SOLN: 5.0000 mg | ORAL | 0 refills | Status: AC | PRN

## 2018-01-27 MED ORDER — GLYCOPYRROLATE 1 MG PO TABS: 1.0000 mg | ORAL_TABLET | ORAL | 0 refills | Status: AC | PRN

## 2018-01-27 NOTE — Progress Notes (Signed)
Pt discharged home with son, taken down to front of hospital with volunteer services via w/c  Son had Pt's tank with him

## 2018-01-27 NOTE — Care Management (Signed)
Son plans to take patient home by car, he has portable oxygen tank with him.   Magdalen Spatz RN [phone]

## 2018-01-27 NOTE — Discharge Summary (Signed)
Physician Discharge Summary  AKITA MAXIM JOA:[id number] DOB: 1925/09/07 DOA: 01/24/2018  PCP: Leonard Downing, MD  Admit date: 01/24/2018 Discharge date: 01/27/2018  Time spent: 25 minutes  Recommendations for Outpatient Follow-up:  1. Patient will be followed by [facility] [place] at home 2. Hospital bed over bed table oxygen wheelchair provided on discharge in addition to prescription for morphine and Ativan  Discharge Diagnoses:  Principal Problem:   Admission for palliative care Active Problems:   COPD (chronic obstructive pulmonary disease) (HCC)   SOB (shortness of breath)   Acute on chronic combined systolic and diastolic congestive heart failure (HCC)   Acute exacerbation of chronic obstructive pulmonary disease (COPD) ([place])   Goals of care, counseling/discussion   Palliative care by specialist   Discharge Condition: Guarded  Diet recommendation: Liberalize  Filed Weights   01/24/18 1451 01/24/18 1959  Weight: 68.6 kg 64.9 kg    History of present illness:  85 year old female with multiple medical problems including COPD CKD 3 systolic heart failure EF 30% hypertension hypothyroidism and recent admission 1/2-1/9 2020 came back to the hospital on 1/24 for shortness of breath and repeat current hypoxic respiratory failure She was admitted palliative care saw the patient and goals were delineated she did not wish to pursue any further aggressive medical therapy as a result we discontinue multiple medications this admission including statin vitamin D and various other nonessential medications She was provided with durable medical equipment as per protocol and provided with prescription for comfort medications after palliative care delineated goals of care She is stabilized from her hospital perspective to go home   Discharge Exam: Vitals:   01/27/18 0521 01/27/18 0809  BP: 121/67   Pulse: 62   Resp: 20   Temp:    SpO2: 99% 98%    General: Awake alert  coherent no distress Cardiovascular: S1-S2 no murmur rub or gallop Respiratory: Clinically clear no added sound Abdomen soft non-tender no rebound no guarding Neurologically intact coherent  Discharge Instructions   Discharge Instructions    Diet - low sodium heart healthy   Complete by:  As directed    Discharge instructions   Complete by:  As directed    Patient will discharge home with hospice at home and will need oxygen on discharge in addition Hard prescription for morphine Ativan given   Increase activity slowly   Complete by:  As directed      Allergies as of 01/27/2018      Reactions   Indomethacin Anaphylaxis, Other (See Comments)   "took it fine for awhile; one day I stopped breathing and I ended up in the hospital" (01/02/2012) Pt has tolerated aspirin      Medication List    STOP taking these medications   cholecalciferol 1000 units tablet Commonly known as:  VITAMIN D   docusate sodium 100 MG capsule Commonly known as:  COLACE   ferrous sulfate 325 (65 FE) MG tablet   spironolactone 25 MG tablet Commonly known as:  ALDACTONE     TAKE these medications   acetaminophen 325 MG tablet Commonly known as:  TYLENOL Take 2 tablets (650 mg total) by mouth every 4 (four) hours as needed for headache or mild pain.   albuterol (2.5 MG/3ML) 0.083% nebulizer solution Commonly known as:  PROVENTIL Take 3 mLs (2.5 mg total) by nebulization every 6 (six) hours as needed for wheezing or shortness of breath.   albuterol 108 (90 Base) MCG/ACT inhaler Commonly known as:  PROVENTIL  HFA;VENTOLIN HFA Inhale 2 puffs into the lungs every 6 (six) hours as needed for wheezing.   apixaban 5 MG Tabs tablet Commonly known as:  ELIQUIS Take 1 tablet (5 mg total) by mouth 2 (two) times daily. What changed:  how much to take   cetirizine 10 MG tablet Commonly known as:  ZYRTEC Take 10 mg by mouth daily as needed for allergies.   fluticasone 50 MCG/ACT nasal spray Commonly  known as:  FLONASE Place 1 spray into both nostrils daily as needed for allergies.   furosemide 40 MG tablet Commonly known as:  LASIX Take 1 tablet (40 mg total) by mouth 2 (two) times daily.   glycopyrrolate 1 MG tablet Commonly known as:  ROBINUL Take 1 tablet (1 mg total) by mouth every 4 (four) hours as needed (excessive secretions).   LORazepam 2 MG/ML concentrated solution Commonly known as:  ATIVAN Place 0.5 mLs (1 mg total) under the tongue every 4 (four) hours as needed for anxiety.   mometasone-formoterol 100-5 MCG/ACT Aero Commonly known as:  DULERA Inhale 2 puffs into the lungs 2 (two) times daily.   morphine CONCENTRATE 10 MG/0.5ML Soln concentrated solution Take 0.25 mLs (5 mg total) by mouth every 2 (two) hours as needed for moderate pain (or dyspnea).   pantoprazole 40 MG tablet Commonly known as:  PROTONIX Take 1 tablet (40 mg total) by mouth daily.   polyethylene glycol packet Commonly known as:  MIRALAX / GLYCOLAX Take 17 g by mouth daily as needed for mild constipation.   polyvinyl alcohol 1.4 % ophthalmic solution Commonly known as:  LIQUIFILM TEARS Place 1 drop into both eyes as needed for dry eyes.            Durable Medical Equipment  (From admission, onward)         Start     Ordered   01/27/18 0911  DME Oxygen  Once    Question Answer Comment  Mode or (Route) Nasal cannula   Liters per Minute 2   Frequency Continuous (stationary and portable oxygen unit needed)   Oxygen conserving device No   Oxygen delivery system Gas      01/27/18 0911         Allergies  Allergen Reactions  . Indomethacin Anaphylaxis and Other (See Comments)    "took it fine for awhile; one day I stopped breathing and I ended up in the hospital" (01/02/2012) Pt has tolerated aspirin      The results of significant diagnostics from this hospitalization (including imaging, microbiology, ancillary and laboratory) are listed below for reference.     Significant Diagnostic Studies: Dg Chest 2 View  Result Date: 01/04/2018 CLINICAL DATA:  Increasing shortness of breath. EXAM: CHEST - 2 VIEW COMPARISON:  Single-view of the chest 01/02/2018 and PA and lateral chest 11/10/2017. Soft tissue neck CT 10/22/2016. FINDINGS: The lungs are clear. The chest is hyperexpanded with some prominence of the pulmonary interstitium. Heart size is normal. Aortic atherosclerosis is seen. Bulky calcifications at the base of the neck are consistent with thyroid calcifications seen on prior neck CT. IMPRESSION: No acute disease. Findings compatible with COPD. Electronically Signed   By: Inge Rise M.D.   On: 01/04/2018 13:44   Dg Chest Port 1 View  Result Date: 01/24/2018 CLINICAL DATA:  Shortness of breath. EXAM: PORTABLE CHEST 1 VIEW COMPARISON:  01/04/2018.  08/14/2017. FINDINGS: Mediastinal widening with mediastinal calcifications again noted consistent with calcified goiter. Similar findings noted on prior exam. Stable  cardiomegaly. Mild left base atelectasis/infiltrate. Bilateral pulmonary interstitial prominence. Findings suggest chronic interstitial lung disease again noted. No pleural effusion or pneumothorax. Upper mediastinal calcific densities most likely within the IMPRESSION: 1.  Stable cardiomegaly. 2.  Mild left base atelectasis/infiltrate. 3. Bilateral pulmonary interstitial prominence again noted. Findings suggest chronic interstitial lung disease. 4. Stable mediastinal widening with prominent calcifications consistent with goiter. Similar findings noted on prior exams. Electronically Signed   By: Marcello Moores  Register   On: 01/24/2018 15:15   Dg Chest Port 1 View  Result Date: 01/03/2018 CLINICAL DATA:  Acute onset of shortness of breath. EXAM: PORTABLE CHEST 1 VIEW COMPARISON:  Chest radiograph performed 11/10/2017 FINDINGS: The lungs are well-aerated. Vascular congestion is noted. Increased interstitial markings may reflect mild interstitial edema.  There is no evidence of pleural effusion or pneumothorax. The cardiomediastinal silhouette is mildly enlarged. No acute osseous abnormalities are seen. Degenerative change is noted at the glenohumeral joints bilaterally, more prominent on the right. IMPRESSION: Vascular congestion and mild cardiomegaly. Increased interstitial markings may reflect mild interstitial edema. Electronically Signed   By: Garald Balding M.D.   On: 01/03/2018 00:22    Microbiology: No results found for this or any previous visit (from the past 240 hour(s)).   Labs: Basic Metabolic Panel: Recent Labs  Lab 01/24/18 1455 01/24/18 1509  NA 140 140  K 4.1 4.1  CL 98  --   CO2 32  --   GLUCOSE 98  --   BUN 17  --   CREATININE 1.05*  --   CALCIUM 9.0  --    Liver Function Tests: No results for input(s): AST, ALT, ALKPHOS, BILITOT, PROT, ALBUMIN in the last 168 hours. No results for input(s): LIPASE, AMYLASE in the last 168 hours. No results for input(s): AMMONIA in the last 168 hours. CBC: Recent Labs  Lab 01/24/18 1455 01/24/18 1509  WBC 4.4  --   NEUTROABS 3.2  --   HGB 11.2* 11.9*  HCT 34.6* 35.0*  MCV 99.1  --   PLT 226  --    Cardiac Enzymes: No results for input(s): CKTOTAL, CKMB, CKMBINDEX, TROPONINI in the last 168 hours. BNP: BNP (last 3 results) Recent Labs    11/10/17 0500 01/02/18 2339 01/24/18 1455  BNP 1,411.4* 1,380.6* 810.1*    ProBNP (last 3 results) No results for input(s): PROBNP in the last 8760 hours.  CBG: No results for input(s): GLUCAP in the last 168 hours.     Signed:  Nita Sells MD   [facility] 01/27/2018, 9:11 AM

## 2018-02-03 NOTE — Progress Notes (Deleted)
Cardiology Office Note   Date:  02/03/2018   ID:  Jacqueline, [name] 03-17-1925, MRN [id number]  PCP:  Leonard Downing, MD  Cardiologist: Dr. Mertie Moores  No chief complaint on file.   History of Present Illness: [name] is a 85 y.o. female who presents for post hospital follow up, seen for   [name] has a past medical history of chronic combined systolic/diastolic congestive heart failure, paroxysmal atrial fibrillation, nonsustained ventricular tachycardia, chronic stage III kidney disease, COPD on home oxygen for evaluation of acute on chronic combined systolic/diastolic congestive heart failure.  Last echocardiogram 11/2017 showed an ejection fraction 30 to 29%, grade 1 diastolic dysfunction, mild mitral regurgitation and biatrial enlargement.  She was last seen by our service in hospital consultation for shortness of breath/CHF exacerbation on 01/07/2018. She was noted to have a long history of cardiomyopathy. Nuclear stress test in 2009 with no ischemia but abnormal due to reduced LV EF.  Her EF at that time was 40 to 45% in 2014.  In 04/2015, she was hospitalized for stroke. Initial 30-day monitor showed no atrial fibrillation, but she was found to have Afib and 11/2015 during hospitalization for COPD.  Plavix was discontinued and she was started on Eliquis at that time. Cardiology was previously consulted for NSVT and she was seen by Dr. Tamala Julian who recommended conservative management with beta-blockers. Bisoprolol was added due to her pulmonary disease. However this was discontinued in 2018 for presumed for bradycardia. More recently she was hospitalized 11/2017 for heart failure and her EF was found to have declined to 30 to 35% with diffuse hypokinesis, mild MR, mild pulmonary hypertension.  She was diuresed with Lasix and discharged on 40 mg twice daily. Cardiology also followed for NSVT and felt that given her advanced age, borderline bradycardia and lack of symptoms no  further intervention was needed.  At hospital sign off, she was returned to 40 mg Lasix PO twice daily with 1 L day fluid restriction and low-sodium diet.  Her BP remained soft and therefore ARB or beta-blocker was not added to her regimen.  She was continued on spironolactone and was not thought to be a candidate for ICD placement. She was continued on apixaban for paroxysmal atrial fibrillation.  Unfortunately, she was hospitalized once again and discharged on 01/27/2018 in which palliative care was consulted for goals of care.  At that time, patient did not wish to pursue any further aggressive medical therapy and she was discharged home with palliative care medicine, durable medical equipment and comfort medications.   Today,  Past Medical History:  Diagnosis Date  . Anemia    years ago  . Arthritis    "right shoulder" (01/02/2012)  . Bladder mass   . CAP (community acquired pneumonia) 01/02/2012   Lower lobe   . Chronic combined systolic and diastolic CHF (congestive heart failure) ([place])   . Chronic respiratory failure ([place])   . CKD (chronic kidney disease), stage III ([place])    by labs  . COPD (chronic obstructive pulmonary disease) ([place])   . Diverticulitis of colon with perforation 01/14/2012   That is post sigmoid resection with [facility] pouch and end colostomy   . GERD (gastroesophageal reflux disease)    occasional  . Goiter    radioactive iodine ablation/notes 01/02/2012 - large compressing pharynx  . History of kidney stones    X 1  . Hypertension   . Hypothyroidism    "I have taken Synthroid before" (  01/02/2012)   HAS HAD RADIOACTIVE IODINE TX 2 YR S AGO  . Intra-abdominal abscess ([place]) 01/14/2012  . NSVT (nonsustained ventricular tachycardia) ([place])   . PAF (paroxysmal atrial fibrillation) ([place])   . Pneumatosis of intestines 01/14/2012  . Pneumonia 01/02/2012; ? 2009  . PVC's (premature ventricular contractions)   . Radiation 08/10/13-09/15/13   57 gray to vaginal/bladder mass  .  Stroke [facility])     Past Surgical History:  Procedure Laterality Date  . ABDOMINAL HYSTERECTOMY  1980's  . APPENDECTOMY  1980's   "when they did hysterectomy" (01/02/2012)  . CATARACT EXTRACTION W/ INTRAOCULAR LENS  IMPLANT, BILATERAL  ?1990's   Bil  . COLON SURGERY    . COLOSTOMY N/A 03/12/2012   Procedure: COLOSTOMY;  Surgeon: Ralene Ok, MD;  Location: [place];  Service: General;  Laterality: N/A;  . COLOSTOMY REVISION N/A 03/12/2012   Procedure: COLON RESECTION SIGMOID ;  Surgeon: Ralene Ok, MD;  Location: [place];  Service: General;  Laterality: N/A;  . COLOSTOMY TAKEDOWN N/A 10/21/2012   Procedure: LAPAROSCOPIC COLOSTOMY TAKEDOWN AND HARTMANS ANASTOMOSIS, rigid proctoscopy;  Surgeon: Ralene Ok, MD;  Location: [facility];  Service: General;  Laterality: N/A;  . EYE SURGERY    . LYSIS OF ADHESION N/A 10/21/2012   Procedure: LYSIS OF ADHESION;  Surgeon: Ralene Ok, MD;  Location: [facility];  Service: General;  Laterality: N/A;  . TRANSURETHRAL RESECTION OF BLADDER TUMOR WITH GYRUS (TURBT-GYRUS) N/A 06/30/2013   Procedure: TRANSURETHRAL RESECTION OF BLADDER TUMOR WITH GYRUS (TURBT-GYRUS)/VAGINAL BIOPSY;  Surgeon: Ardis Hughs, MD;  Location: [facility];  Service: Urology;  Laterality: N/A;     Current Outpatient Medications  Medication Sig Dispense Refill  . acetaminophen (TYLENOL) 325 MG tablet Take 2 tablets (650 mg total) by mouth every 4 (four) hours as needed for headache or mild pain. 40 tablet 0  . albuterol (PROVENTIL HFA;VENTOLIN HFA) 108 (90 BASE) MCG/ACT inhaler Inhale 2 puffs into the lungs every 6 (six) hours as needed for wheezing.    Marland Kitchen albuterol (PROVENTIL) (2.5 MG/3ML) 0.083% nebulizer solution Take 3 mLs (2.5 mg total) by nebulization every 6 (six) hours as needed for wheezing or shortness of breath. 75 mL 12  . apixaban (ELIQUIS) 5 MG TABS tablet Take 1 tablet (5 mg total) by mouth 2 (two) times daily. (Patient taking differently: Take 2.5 mg by mouth 2 (two)  times daily. ) 120 tablet 0  . cetirizine (ZYRTEC) 10 MG tablet Take 10 mg by mouth daily as needed for allergies.     . fluticasone (FLONASE) 50 MCG/ACT nasal spray Place 1 spray into both nostrils daily as needed for allergies.     . furosemide (LASIX) 40 MG tablet Take 1 tablet (40 mg total) by mouth 2 (two) times daily. 60 tablet 0  . glycopyrrolate (ROBINUL) 1 MG tablet Take 1 tablet (1 mg total) by mouth every 4 (four) hours as needed (excessive secretions). 30 tablet 0  . LORazepam (ATIVAN) 2 MG/ML concentrated solution Place 0.5 mLs (1 mg total) under the tongue every 4 (four) hours as needed for anxiety. 30 mL 0  . mometasone-formoterol (DULERA) 100-5 MCG/ACT AERO Inhale 2 puffs into the lungs 2 (two) times daily. 1 Inhaler 0  . Morphine Sulfate (MORPHINE CONCENTRATE) 10 MG/0.5ML SOLN concentrated solution Take 0.25 mLs (5 mg total) by mouth every 2 (two) hours as needed for moderate pain (or dyspnea). 180 mL 0  . pantoprazole (PROTONIX) 40 MG tablet Take 1 tablet (40 mg total) by mouth  daily. 90 tablet 1  . polyethylene glycol (MIRALAX / GLYCOLAX) packet Take 17 g by mouth daily as needed for mild constipation.     . polyvinyl alcohol (LIQUIFILM TEARS) 1.4 % ophthalmic solution Place 1 drop into both eyes as needed for dry eyes.     No current facility-administered medications for this visit.     Allergies:   Indomethacin    Social History:  The patient  reports that she quit smoking about 27 years ago. Her smoking use included cigarettes. She has a 4.20 pack-year smoking history. She has never used smokeless tobacco. She reports that she does not drink alcohol or use drugs.   Family History:  The patient's family history includes Cancer in her sister; Heart disease in her mother and another family member; Stroke in her sister.    ROS:  Please see the history of present illness.  Otherwise, review of systems are positive for {NONE DEFAULTED:18576::"none"}.   All other systems are  reviewed and negative.    PHYSICAL EXAM: VS:  There were no vitals taken for this visit. , BMI There is no height or weight on file to calculate BMI.  General: Well developed, well nourished, NAD Skin: Warm, dry, intact  Head: Normocephalic, atraumatic, sclera non-icteric, no xanthomas, clear, moist mucus membranes. Neck: Negative for carotid bruits. No JVD Lungs:Clear to ausculation bilaterally. No wheezes, rales, or rhonchi. Breathing is unlabored. Cardiovascular: RRR with S1 S2. No murmurs, rubs, gallops, or LV heave appreciated. Abdomen: Soft, non-tender, non-distended with normoactive bowel sounds. No hepatomegaly, No rebound/guarding. No obvious abdominal masses. MSK: Strength and tone appear normal for age. 5/5 in all extremities Extremities: No edema. No clubbing or cyanosis. DP/PT pulses 2+ bilaterally Neuro: Alert and oriented. No focal deficits. No facial asymmetry. MAE spontaneously. Psych: Responds to questions appropriately with normal affect.      EKG:  EKG {ACTION; IS/IS HQI:[id number]} ordered today. The ekg ordered today demonstrates ***   Recent Labs: 01/02/2018: ALT 45 01/07/2018: Magnesium 2.2 01/24/2018: B Natriuretic Peptide 810.1; BUN 17; Creatinine, Ser 1.05; Hemoglobin 11.9; Platelets 226; Potassium 4.1; Sodium 140    Lipid Panel    Component Value Date/Time   CHOL 118 05/01/2017 0728   TRIG 56 05/01/2017 0728   HDL 33 (L) 05/01/2017 0728   CHOLHDL 3.6 05/01/2017 0728   VLDL 11 05/01/2017 0728   LDLCALC 74 05/01/2017 0728      Wt Readings from Last 3 Encounters:  01/24/18 143 lb 1.3 oz (64.9 kg)  01/09/18 151 lb 4.8 oz (68.6 kg)  12/10/17 150 lb 3.2 oz (68.1 kg)      Other studies Reviewed: Additional studies/ records that were reviewed today include: ***. Review of the above records demonstrates: ***   ASSESSMENT AND PLAN:  1.  Chronic combined systolic/diastolic congestive heart failure: -Continue Lasix 40 mg twice daily -Does not appear  to be fluid volume overloaded on exam today -Per last hospital note, patient wishes for no further aggressive medical therapy  -Patient was discharged home with palliative care medicine, durable medical equipment and comfort medications with hospice [facility] the [facility]  2.  Presumed nonischemic cardiomyopathy: -Per echocardiogram with LVEF 30 to 35% -Given soft BP, no ARB/beta-blocker added to her current regimen -Continued on spironolactone  3.  Home O2 dependent COPD: -Palliative care on last hospital discharge for goals of care  -See #1  4.  Paroxysmal atrial fibrillation: -Was last in NSR on hospital discharge -Apixaban was continued   Current medicines are reviewed  at length with the patient today.  The patient {ACTIONS; HAS/DOES NOT HAVE:19233} concerns regarding medicines.  The following changes have been made:  {PLAN; NO CHANGE:13088:s}  Labs/ tests ordered today include: *** No orders of the defined types were placed in this encounter.    Disposition:   FU with *** in {gen number 3-70:230172} {Days to years:10300}  Signed, Kathyrn Drown, NP  02/03/2018 3:36 [facility] [place], [place], [place]  [address] Phone: [phone]; Fax: [phone]

## 2018-02-04 ENCOUNTER — Ambulatory Visit: Payer: Medicare Other | Admitting: Physician Assistant

## 2018-02-05 ENCOUNTER — Encounter: Payer: Self-pay | Admitting: Physician Assistant

## 2018-02-14 LAB — BLOOD GAS, VENOUS

## 2019-01-14 ENCOUNTER — Telehealth: Payer: Self-pay | Admitting: Cardiovascular Disease

## 2019-01-14 ENCOUNTER — Telehealth (INDEPENDENT_AMBULATORY_CARE_PROVIDER_SITE_OTHER): Payer: Medicare Other | Admitting: Physician Assistant

## 2019-01-14 ENCOUNTER — Encounter: Payer: Self-pay | Admitting: Physician Assistant

## 2019-01-14 ENCOUNTER — Other Ambulatory Visit: Payer: Self-pay

## 2019-01-14 ENCOUNTER — Telehealth: Payer: Self-pay

## 2019-01-14 VITALS — Ht 60.0 in | Wt 139.0 lb

## 2019-01-14 DIAGNOSIS — I5042 Chronic combined systolic (congestive) and diastolic (congestive) heart failure: Secondary | ICD-10-CM

## 2019-01-14 DIAGNOSIS — I2510 Atherosclerotic heart disease of native coronary artery without angina pectoris: Secondary | ICD-10-CM

## 2019-01-14 DIAGNOSIS — I480 Paroxysmal atrial fibrillation: Secondary | ICD-10-CM

## 2019-01-14 NOTE — Progress Notes (Signed)
Virtual Visit via Telephone Note   This visit type was conducted due to national recommendations for restrictions regarding the COVID-19 Pandemic (e.g. social distancing) in an effort to limit this patient's exposure and mitigate transmission in our community.  Due to her co-morbid illnesses, this patient is at least at moderate risk for complications without adequate follow up.  This format is felt to be most appropriate for this patient at this time.  The patient did not have access to video technology/had technical difficulties with video requiring transitioning to audio format only (telephone).  All issues noted in this document were discussed and addressed.  No physical exam could be performed with this format.  Please refer to the patient's chart for her  consent to telehealth for [facility].   Date:  01/14/2019   ID:  [name], DOB Jul 22, 1925, MRN [id number]  Patient Location: Home Provider Location: Office  PCP:  Leonard Downing, MD  Cardiologist:  Mertie Moores, MD   Electrophysiologist:  None   Evaluation Performed:  Follow-Up Visit  Chief Complaint:  CHF, AFib  History of Present Illness:    [name] is a 86 y.o. female with   Chronic combined systolic and diastolic CHF  EF 57-84 (echo 2017)  EF 30-35 (echo 11/2017)  Parox AFib  CHA2DS2-VASc=8 >> Apixaban   Hx of CVA in 2017  Coronary calcification on CT in the past  Hypertension   Hypothyroidism  Goiter, s/p radioactive iodine  Lg goiter compressing pharynx  COPD on home O2  Chronic kidney disease 3  NSVT  Hx of Diverticulosis/itis  S/p perforation w emergent surgery, colostomy   Squamous cell CA  DNR  She was last seen in the office in 12/2017 by Ermalinda Barrios, PA-C.  She was admitted x 2 in Jan 2020 with a/c hypoxic respiratory failure secondary to decompensated CHF and COPD exacerbation.  She was seen by Palliative Care during her last admit and was made DNR.  Today, she  notes she is followed by hospice.  She has chronic shortness of breath and wears oxygen continuously.  She sleeps on an incline chronically.  She has not had PND.  She does not have significant leg swelling.  She has not had syncope or significant chest pain.  She has not had any bleeding issues.    Past Medical History:  Diagnosis Date  . Anemia    years ago  . Arthritis    "right shoulder" (01/02/2012)  . Bladder mass   . CAP (community acquired pneumonia) 01/02/2012   Lower lobe   . Chronic combined systolic and diastolic CHF (congestive heart failure) ([place])   . Chronic respiratory failure ([place])   . CKD (chronic kidney disease), stage III    by labs  . COPD (chronic obstructive pulmonary disease) ([place])   . Diverticulitis of colon with perforation 01/14/2012   That is post sigmoid resection with [facility] pouch and end colostomy   . GERD (gastroesophageal reflux disease)    occasional  . Goiter    radioactive iodine ablation/notes 01/02/2012 - large compressing pharynx  . History of kidney stones    X 1  . Hypertension   . Hypothyroidism    "I have taken Synthroid before" (01/02/2012)   HAS HAD RADIOACTIVE IODINE TX 2 YR S AGO  . Intra-abdominal abscess ([place]) 01/14/2012  . NSVT (nonsustained ventricular tachycardia) ([place])   . PAF (paroxysmal atrial fibrillation) ([place])   . Pneumatosis of intestines 01/14/2012  . Pneumonia 01/02/2012; ?  2009  . PVC's (premature ventricular contractions)   . Radiation 08/10/13-09/15/13   57 gray to vaginal/bladder mass  . Stroke [facility])    Past Surgical History:  Procedure Laterality Date  . ABDOMINAL HYSTERECTOMY  1980's  . APPENDECTOMY  1980's   "when they did hysterectomy" (01/02/2012)  . CATARACT EXTRACTION W/ INTRAOCULAR LENS  IMPLANT, BILATERAL  ?1990's   [place]  . COLON SURGERY    . COLOSTOMY N/A 03/12/2012   Procedure: COLOSTOMY;  Surgeon: Ralene Ok, MD;  Location: [place];  Service: General;  Laterality: N/A;  . COLOSTOMY REVISION N/A 03/12/2012     Procedure: COLON RESECTION SIGMOID ;  Surgeon: Ralene Ok, MD;  Location: [place];  Service: General;  Laterality: N/A;  . COLOSTOMY TAKEDOWN N/A 10/21/2012   Procedure: LAPAROSCOPIC COLOSTOMY TAKEDOWN AND HARTMANS ANASTOMOSIS, rigid proctoscopy;  Surgeon: Ralene Ok, MD;  Location: [facility];  Service: General;  Laterality: N/A;  . EYE SURGERY    . LYSIS OF ADHESION N/A 10/21/2012   Procedure: LYSIS OF ADHESION;  Surgeon: Ralene Ok, MD;  Location: [facility];  Service: General;  Laterality: N/A;  . TRANSURETHRAL RESECTION OF BLADDER TUMOR WITH GYRUS (TURBT-GYRUS) N/A 06/30/2013   Procedure: TRANSURETHRAL RESECTION OF BLADDER TUMOR WITH GYRUS (TURBT-GYRUS)/VAGINAL BIOPSY;  Surgeon: Ardis Hughs, MD;  Location: [facility];  Service: Urology;  Laterality: N/A;     Current Meds  Medication Sig  . acetaminophen (TYLENOL) 325 MG tablet Take 2 tablets (650 mg total) by mouth every 4 (four) hours as needed for headache or mild pain.  Marland Kitchen albuterol (PROVENTIL HFA;VENTOLIN HFA) 108 (90 BASE) MCG/ACT inhaler Inhale 2 puffs into the lungs every 6 (six) hours as needed for wheezing.  Marland Kitchen albuterol (PROVENTIL) (2.5 MG/3ML) 0.083% nebulizer solution Take 3 mLs (2.5 mg total) by nebulization every 6 (six) hours as needed for wheezing or shortness of breath.  Marland Kitchen apixaban (ELIQUIS) 5 MG TABS tablet Take 2.5 mg by mouth 2 (two) times daily.  . cetirizine (ZYRTEC) 10 MG tablet Take 10 mg by mouth daily as needed for allergies.   . fluticasone (FLONASE) 50 MCG/ACT nasal spray Place 1 spray into both nostrils daily as needed for allergies.   . furosemide (LASIX) 40 MG tablet Take 1 tablet (40 mg total) by mouth 2 (two) times daily.  Marland Kitchen glycopyrrolate (ROBINUL) 1 MG tablet Take 1 tablet (1 mg total) by mouth every 4 (four) hours as needed (excessive secretions).  . LORazepam (ATIVAN) 2 MG/ML concentrated solution Place 0.5 mLs (1 mg total) under the tongue every 4 (four) hours as needed for anxiety.  . Morphine  Sulfate (MORPHINE CONCENTRATE) 10 MG/0.5ML SOLN concentrated solution Take 0.25 mLs (5 mg total) by mouth every 2 (two) hours as needed for moderate pain (or dyspnea).  . Mouth Kote (MOUTH KOTE) SOLN Apply 1 spray topically as needed.   . pantoprazole (PROTONIX) 40 MG tablet Take 1 tablet (40 mg total) by mouth daily.  . polyethylene glycol (MIRALAX / GLYCOLAX) packet Take 17 g by mouth daily as needed for mild constipation.   . polyvinyl alcohol (LIQUIFILM TEARS) 1.4 % ophthalmic solution Place 1 drop into both eyes as needed for dry eyes.  Marland Kitchen prochlorperazine (COMPAZINE) 10 MG tablet Take 10 mg by mouth every 6 (six) hours as needed.  Marland Kitchen spironolactone (ALDACTONE) 25 MG tablet Take 12.5 mg by mouth daily.     Allergies:   Indomethacin   Social History   Tobacco Use  . Smoking status: Former Smoker  Packs/day: 0.12    Years: 35.00    Pack years: 4.20    Types: Cigarettes    Quit date: 01/02/1991    Years since quitting: 28.0  . Smokeless tobacco: Never Used  Substance Use Topics  . Alcohol use: No  . Drug use: No     Family Hx: The patient's family history includes Cancer in her sister; Heart disease in her mother and another family member; Stroke in her sister.  ROS:   Please see the history of present illness.    All other systems reviewed and are negative.   Prior CV studies:   The following studies were reviewed today:   Echocardiogram 11/11/2017 EF 30-35, diffuse HK, grade 1 diastolic dysfunction, moderate MAC, mild MR, severe LAE, normal RV SF, PASP 40 (mild pulmonary pretension)  Echocardiogram 05/01/2017 Mild LVH, EF 35-40, diffuse HK with severe HK in the inferior and inferolateral myocardium, grade 1 diastolic dysfunction, MAC, mild MR, severe LAE, PASP 47  Event monitor 04/22/2015 NSR . No evidence of atrial fib ( which is what we are primarily looking for )  She had a recent CVA  Carotid ultrasound 04/12/2015 Bilateral ICA 1-39  Chest CT  02/13/2014 IMPRESSION: 1. Negative for acute PE or thoracic aortic dissection. 2. Patchy airspace opacities in the lingula, anterior left upper lobe, right upper and lower lobes, new since prior study, suggesting multifocal pneumonia. Consider follow-up to confirm appropriate resolution. 3. Thyromegaly with multiple nodules, incompletely visualized. 4. Atherosclerosis, including aortic and coronary artery disease. Please note that although the presence of coronary artery calcium documents the presence of coronary artery disease, the severity of this disease and any potential stenosis cannot be assessed on this non-gated CT examination. Assessment for potential risk factor modification, dietary therapy or pharmacologic therapy may be warranted, if clinically indicated.   Labs/Other Tests and Data Reviewed:    EKG:  No ECG reviewed.  Recent Labs: 01/24/2018: B Natriuretic Peptide 810.1; BUN 17; Creatinine, Ser 1.05; Hemoglobin 11.9; Platelets 226; Potassium 4.1; Sodium 140   Recent Lipid Panel Lab Results  Component Value Date/Time   CHOL 118 05/01/2017 07:28 AM   TRIG 56 05/01/2017 07:28 AM   HDL 33 (L) 05/01/2017 07:28 AM   CHOLHDL 3.6 05/01/2017 07:28 AM   LDLCALC 74 05/01/2017 07:28 AM     Wt Readings from Last 3 Encounters:  01/14/19 139 lb (63 kg)  01/24/18 143 lb 1.3 oz (64.9 kg)  01/09/18 151 lb 4.8 oz (68.6 kg)     Objective:    Vital Signs:  Ht 5' (1.524 m)   Wt 139 lb (63 kg)   BMI 27.15 kg/m    VITAL SIGNS:  reviewed GEN:  no acute distress RESPIRATORY:  No labored breathing noted NEURO:  Alert and oriented PSYCH:  Normal mood   CHA2DS2-VASc Score = 8 The patient's score is based upon: CHF History: Yes HTN History: Yes Age : 86 + Diabetes History: No Stroke History: Yes Vascular Disease History: Yes Gender: Female       ASSESSMENT & PLAN:    1. Chronic combined systolic and diastolic CHF (congestive heart failure) (HCC) EF 30-35 by  echocardiogram in 2019.  She is DNR and currently on hospice.  Overall, her volume status seems to be stable.  Continue current management.  2. PAF (paroxysmal atrial fibrillation) ([place]) She remains on anticoagulation with Apixaban.  We will try to arrange follow-up BMET, CBC through hospice.  Her weight is currently over 60 kg.  If  her weight should continue to drop below 60 kg, her apixaban will need to be changed to 2.5 mg twice daily.  3. Coronary artery disease involving native coronary artery of native heart without angina pectoris Coronary calcification on prior CT.  She likely has significant CAD contributing to her heart failure.  She is not a candidate for further evaluation as she is currently on hospice and is DNR.  She is not having anginal symptoms.   Time:   Today, I have spent 12 minutes with the patient with telehealth technology discussing the above problems.     Medication Adjustments/Labs and Tests Ordered: Current medicines are reviewed at length with the patient today.  Concerns regarding medicines are outlined above.   Tests Ordered: No orders of the defined types were placed in this encounter.   Medication Changes: No orders of the defined types were placed in this encounter.   Follow Up:  Virtual Visit  in 1 year(s)  Signed, Richardson Dopp, PA-C  01/14/2019 2:29 PM    [place]

## 2019-01-14 NOTE — Telephone Encounter (Signed)
Virtual Visit Pre-Appointment Phone Call  TELEPHONE CALL NOTE  [name] has been deemed a candidate for a follow-up tele-health visit to limit community exposure during the Covid-19 pandemic. I spoke with the patient via phone to ensure availability of phone/video source, confirm preferred email & phone number, and discuss instructions and expectations.  I reminded [name] to be prepared with any vital sign and/or heart rhythm information that could potentially be obtained via home monitoring, at the time of her visit. I reminded [name] to expect a phone call prior to her visit.   Patient agrees to consent below.  Cleon Gustin, RN 01/14/2019 10:41 AM   INSTRUCTIONS FOR DOWNLOADING THE MYCHART APP TO SMARTPHONE  - The patient must first make sure to have activated MyChart and know their login information - If Apple, go to [employer] and type in MyChart in the search bar and download the app. If Android, ask patient to go to [employer] and type in [place] in the search bar and download the app. The app is free but as with any other app downloads, their phone may require them to verify saved payment information or Apple/Android password.  - The patient will need to then log into the app with their MyChart username and password, and select [facility] as their healthcare provider to link the account. When it is time for your visit, go to the MyChart app, find appointments, and click Begin Video Visit. Be sure to Select Allow for your device to access the Microphone and Camera for your visit. You will then be connected, and your provider will be with you shortly.  **If they have any issues connecting, or need assistance please contact MyChart service desk [phone])**  **If using a computer, in order to ensure the best quality for their visit they will need to use either of the following Internet Browsers: [employer], or Google Chrome**  IF USING  DOXIMITY or DOXY.ME - The patient will receive a link just prior to their visit by text.     FULL LENGTH CONSENT FOR TELE-HEALTH VISIT   I hereby voluntarily request, consent and authorize [facility] and its employed or contracted physicians, physician assistants, nurse practitioners or other licensed health care professionals (the Practitioner), to provide me with telemedicine health care services (the "Services") as deemed necessary by the treating Practitioner. I acknowledge and consent to receive the Services by the Practitioner via telemedicine. I understand that the telemedicine visit will involve communicating with the Practitioner through live audiovisual communication technology and the disclosure of certain medical information by electronic transmission. I acknowledge that I have been given the opportunity to request an in-person assessment or other available alternative prior to the telemedicine visit and am voluntarily participating in the telemedicine visit.  I understand that I have the right to withhold or withdraw my consent to the use of telemedicine in the course of my care at any time, without affecting my right to future care or treatment, and that the Practitioner or I may terminate the telemedicine visit at any time. I understand that I have the right to inspect all information obtained and/or recorded in the course of the telemedicine visit and may receive copies of available information for a reasonable fee.  I understand that some of the potential risks of receiving the Services via telemedicine include:  Marland Kitchen Delay or interruption in medical evaluation due to technological equipment failure or disruption; . Information  transmitted may not be sufficient (e.g. poor resolution of images) to allow for appropriate medical decision making by the Practitioner; and/or  . In rare instances, security protocols could fail, causing a breach of personal health information.  Furthermore, I  acknowledge that it is my responsibility to provide information about my medical history, conditions and care that is complete and accurate to the best of my ability. I acknowledge that Practitioner's advice, recommendations, and/or decision may be based on factors not within their control, such as incomplete or inaccurate data provided by me or distortions of diagnostic images or specimens that may result from electronic transmissions. I understand that the practice of medicine is not an exact science and that Practitioner makes no warranties or guarantees regarding treatment outcomes. I acknowledge that I will receive a copy of this consent concurrently upon execution via email to the email address I last provided but may also request a printed copy by calling the office of [facility].    I understand that my insurance will be billed for this visit.   I have read or had this consent read to me. . I understand the contents of this consent, which adequately explains the benefits and risks of the Services being provided via telemedicine.  . I have been provided ample opportunity to ask questions regarding this consent and the Services and have had my questions answered to my satisfaction. . I give my informed consent for the services to be provided through the use of telemedicine in my medical care  By participating in this telemedicine visit I agree to the above.

## 2019-01-14 NOTE — Telephone Encounter (Signed)
New Message:   Patient called stating that she would like to inform Dr. Acie Fredrickson that she is on hospice. Patient states she is concerned that her being on hospice will affect her appointment on 01/14/19 at 1:45 PM. Please call.

## 2019-01-14 NOTE — Telephone Encounter (Signed)
Per request from Richardson Dopp, [place] during pt's virtual visit, Sharyn Lull, RN from pts hospice group will draw a CBC and BMP on Friday Jan 15. Results will be faxed to our main number for review.

## 2019-07-02 DEATH — deceased
